# Patient Record
Sex: Male | Born: 1965
Health system: Southern US, Community
[De-identification: ages and names within clinical notes are randomized; demographics above are authoritative.]

## PROBLEM LIST (undated history)

## (undated) DIAGNOSIS — E119 Type 2 diabetes mellitus without complications: Secondary | ICD-10-CM

## (undated) DIAGNOSIS — I219 Acute myocardial infarction, unspecified: Secondary | ICD-10-CM

## (undated) DIAGNOSIS — I2510 Atherosclerotic heart disease of native coronary artery without angina pectoris: Secondary | ICD-10-CM

## (undated) DIAGNOSIS — I10 Essential (primary) hypertension: Secondary | ICD-10-CM

## (undated) DIAGNOSIS — K5090 Crohn's disease, unspecified, without complications: Secondary | ICD-10-CM

## (undated) DIAGNOSIS — K5190 Ulcerative colitis, unspecified, without complications: Secondary | ICD-10-CM

## (undated) DIAGNOSIS — I77819 Aortic ectasia, unspecified site: Secondary | ICD-10-CM

## (undated) HISTORY — PX: COLON SURGERY: SHX602

## (undated) HISTORY — DX: Type 2 diabetes mellitus without complications: E11.9

## (undated) HISTORY — DX: Ulcerative colitis, unspecified, without complications: K51.90

## (undated) HISTORY — DX: Essential (primary) hypertension: I10

## (undated) HISTORY — PX: COLONOSCOPY: SHX174

## (undated) HISTORY — PX: EYE SURGERY: SHX253

---

## 1998-07-22 HISTORY — PX: TESTICLE REMOVAL: SHX68

## 2001-05-19 ENCOUNTER — Encounter (INDEPENDENT_AMBULATORY_CARE_PROVIDER_SITE_OTHER): Payer: Self-pay | Admitting: Specialist

## 2001-05-19 ENCOUNTER — Inpatient Hospital Stay (HOSPITAL_COMMUNITY): Admission: EM | Admit: 2001-05-19 | Discharge: 2001-05-29 | Payer: Self-pay | Admitting: Urology

## 2001-05-20 ENCOUNTER — Encounter: Payer: Self-pay | Admitting: Urology

## 2001-05-24 ENCOUNTER — Encounter: Payer: Self-pay | Admitting: Internal Medicine

## 2001-05-26 ENCOUNTER — Encounter: Payer: Self-pay | Admitting: Urology

## 2001-05-27 ENCOUNTER — Encounter: Payer: Self-pay | Admitting: Urology

## 2005-04-08 ENCOUNTER — Inpatient Hospital Stay (HOSPITAL_COMMUNITY): Admission: EM | Admit: 2005-04-08 | Discharge: 2005-04-09 | Payer: Self-pay | Admitting: *Deleted

## 2005-04-08 IMAGING — CT CT ORBIT/TEMPORAL/IAC W/O CM
3 series · 17 of 30 positions shown, 19 images · IV contrast (agent unspecified)
Comparison: none

CLINICAL DATA: Nail in eye, preop.
 HEAD CT WITHOUT CONTRAST:
TECHNIQUE: Contiguous axial images were obtained from the base of the skull through the vertex, according to standard protocol, without contrast.
 Axial scans from the base to the vertex were performed in the unenhanced state.  The ventricular system is normal in size and configuration and the septum is in a normal midline position.  On image #14, there is a small focus of increased attenuation posteriorly in the right posterior parietal - occipital region.  This is higher attenuation than adjacent brain and may represent a small focus of hemorrhagic contusion.  Has the patient undergone recent trauma?
TECHNIQUE: Axial and coronal plane CT imaging was performed through the orbits.  No intravenous contrast was administered.
 On the soft tissue windows, no metallic foreign body is seen.  Both globes are symmetrical, and no retrobulbar abnormality is seen.  The optic nerves and extraocular musculature appears normal.  No bony abnormality is seen.  The zygomatic arches are intact and the condylar heads appear to be normal in position.

[Series 2: brain · axial · 0.47mm/px · z∈[+167,+288]mm · 7 of 32 slices shown, 9 images]
[im 4/32  brain]
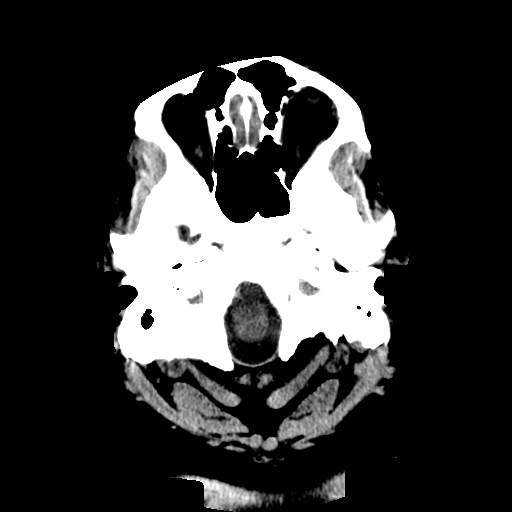
[im 4/32  bone]
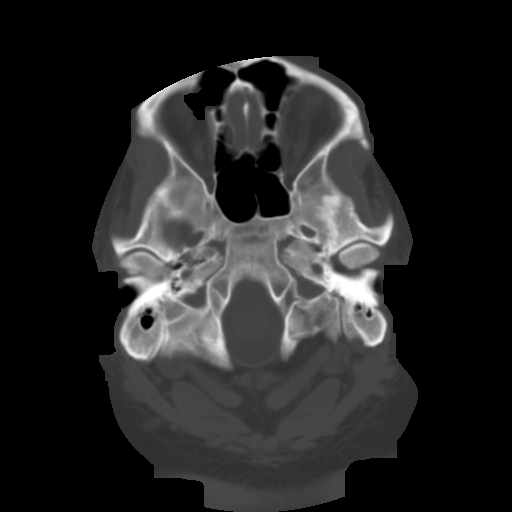
[im 8/32  bone]
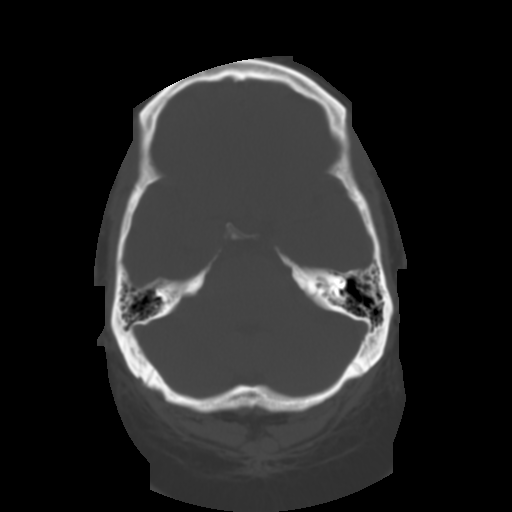
[im 12/32  bone]
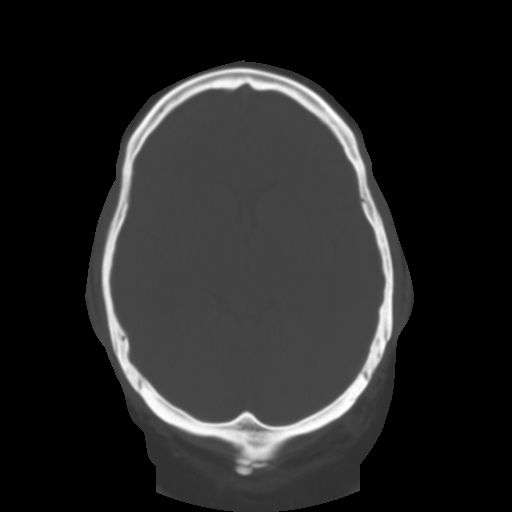
[im 16/32  bone]
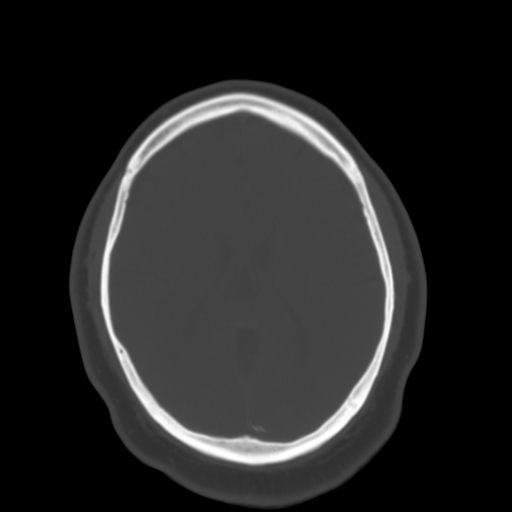
[im 20/32  brain]
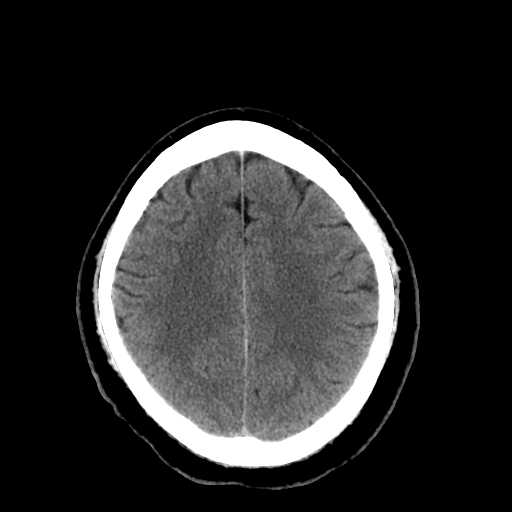
[im 20/32  bone]
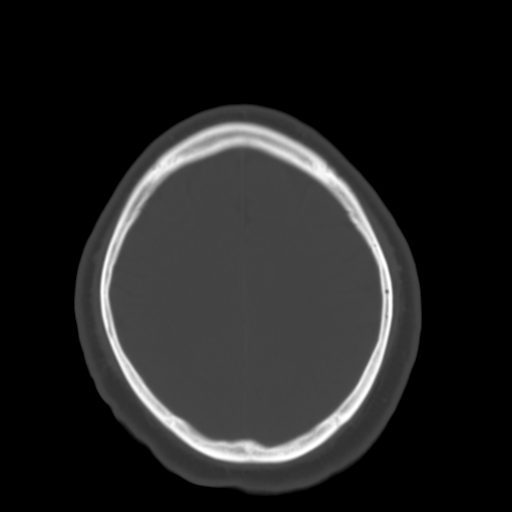
[im 24/32  bone]
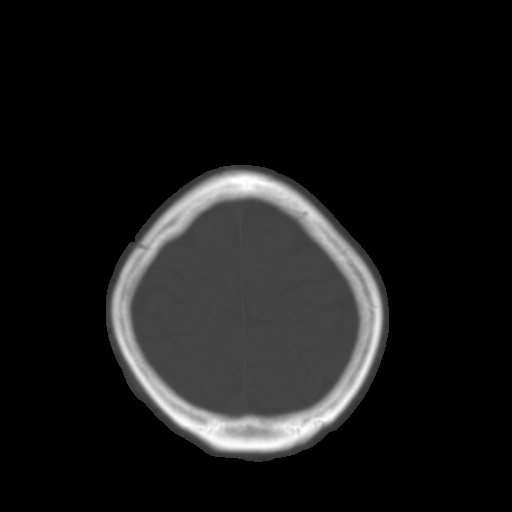
[im 28/32  bone]
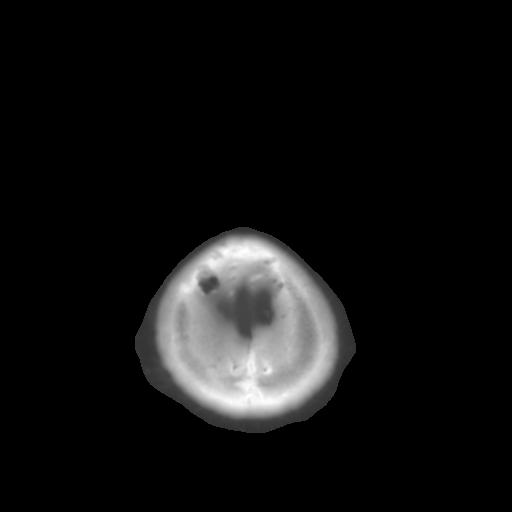

[Series 3: recon 2: brain · axial · 0.47mm/px · z∈[+167,+288]mm · 8 of 32 slices shown]
[im 4/32  bone]
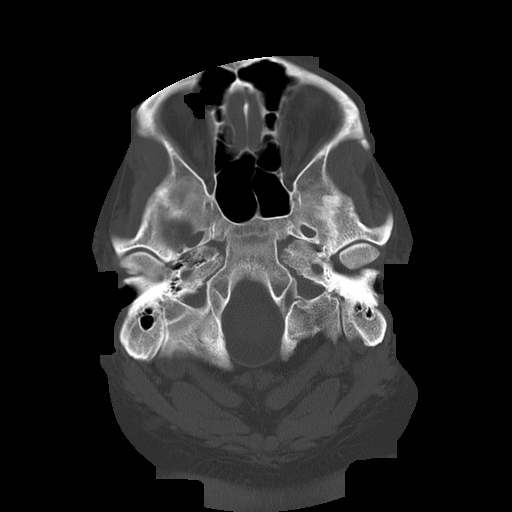
[im 7/32  bone]
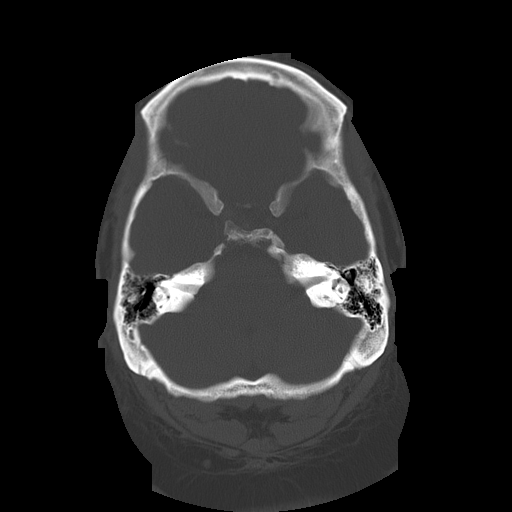
[im 11/32  bone]
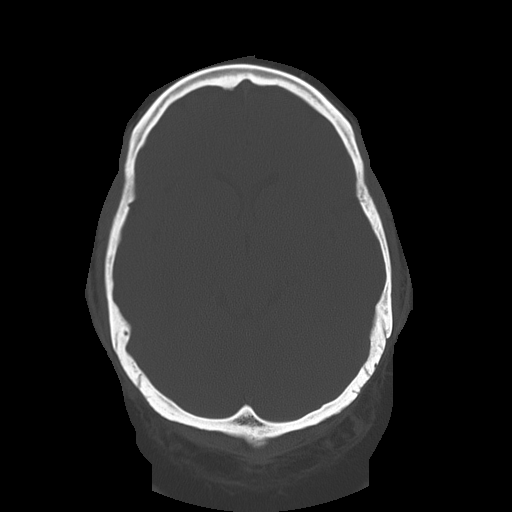
[im 14/32  bone]
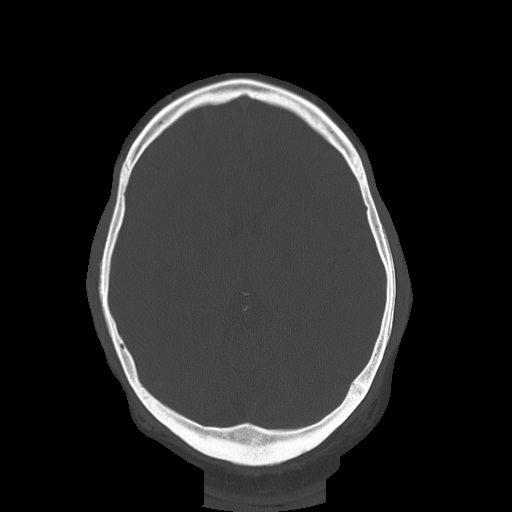
[im 18/32  bone]
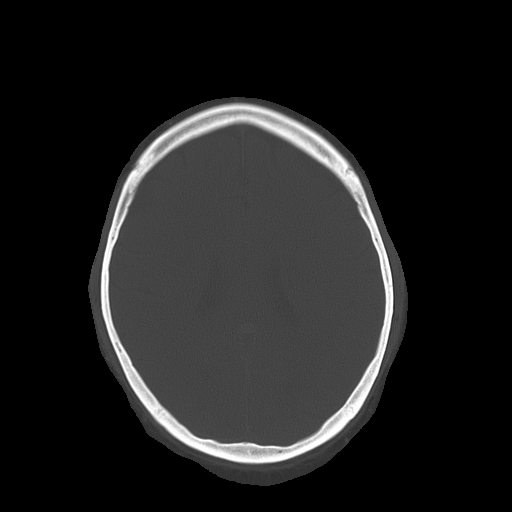
[im 21/32  bone]
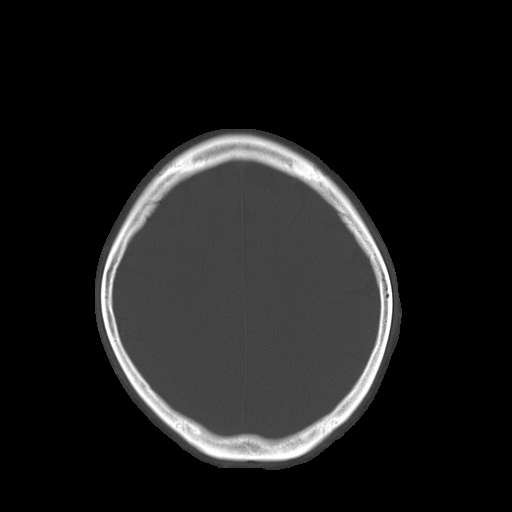
[im 25/32  bone]
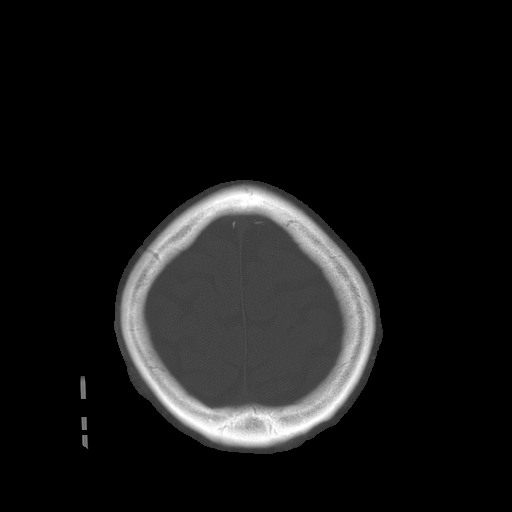
[im 28/32  bone]
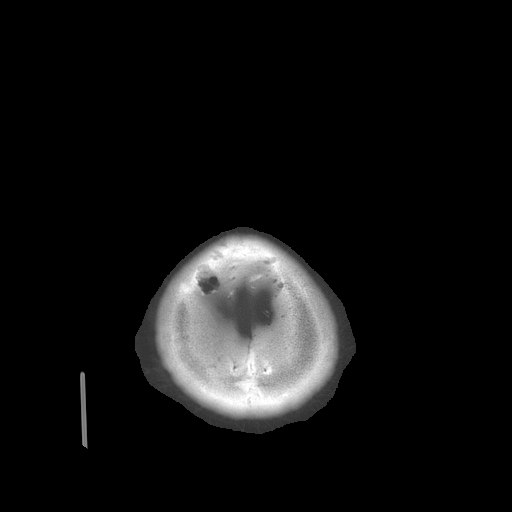

[Series 5: recon 2: supine facial bones · axial · 0.19mm/px · z∈[+104,+112]mm · 2 of 35 slices shown]
[im 4/35  bone]
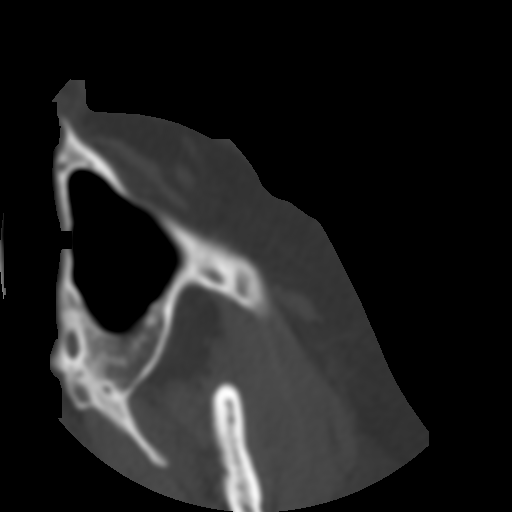
[im 7/35  bone]
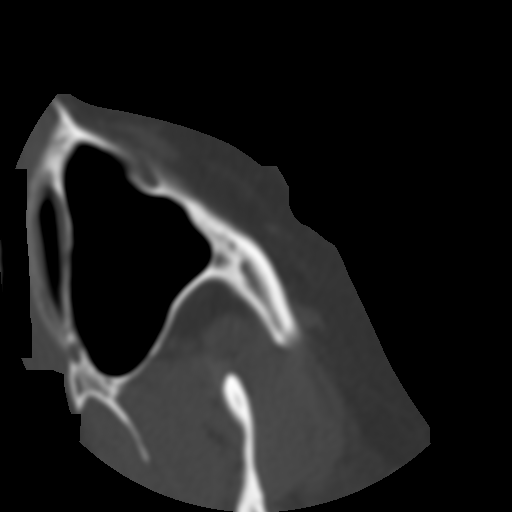

[17 of 30 positions shown; findings below may reference images not displayed]

IMPRESSION: Small focus of higher attenuation in the right posterior parietal-occipital region may represent a focus of hemorrhagic contusion.  Has there been recent trauma to the head?
 CT OF THE ORBITS WITHOUT CONTRAST:
IMPRESSION: Both globes appear symmetrical in size and configuration and no opaque foreign body is seen.  No fracture is noted.

## 2005-11-11 ENCOUNTER — Inpatient Hospital Stay (HOSPITAL_COMMUNITY): Admission: RE | Admit: 2005-11-11 | Discharge: 2005-11-13 | Payer: Self-pay | Admitting: Urology

## 2006-08-14 ENCOUNTER — Emergency Department (HOSPITAL_COMMUNITY): Admission: EM | Admit: 2006-08-14 | Discharge: 2006-08-14 | Payer: Self-pay | Admitting: Family Medicine

## 2007-02-28 ENCOUNTER — Emergency Department (HOSPITAL_COMMUNITY): Admission: EM | Admit: 2007-02-28 | Discharge: 2007-02-28 | Payer: Self-pay | Admitting: Family Medicine

## 2007-02-28 IMAGING — CR DG FOOT COMPLETE 3+V*R*
3 series · 3 of 3 positions shown · non-contrast
Comparison: none

CLINICAL DATA: Injury three days ago.
 RIGHT FOOT ? 3 VIEW:

[view not recorded (1 of 3)]
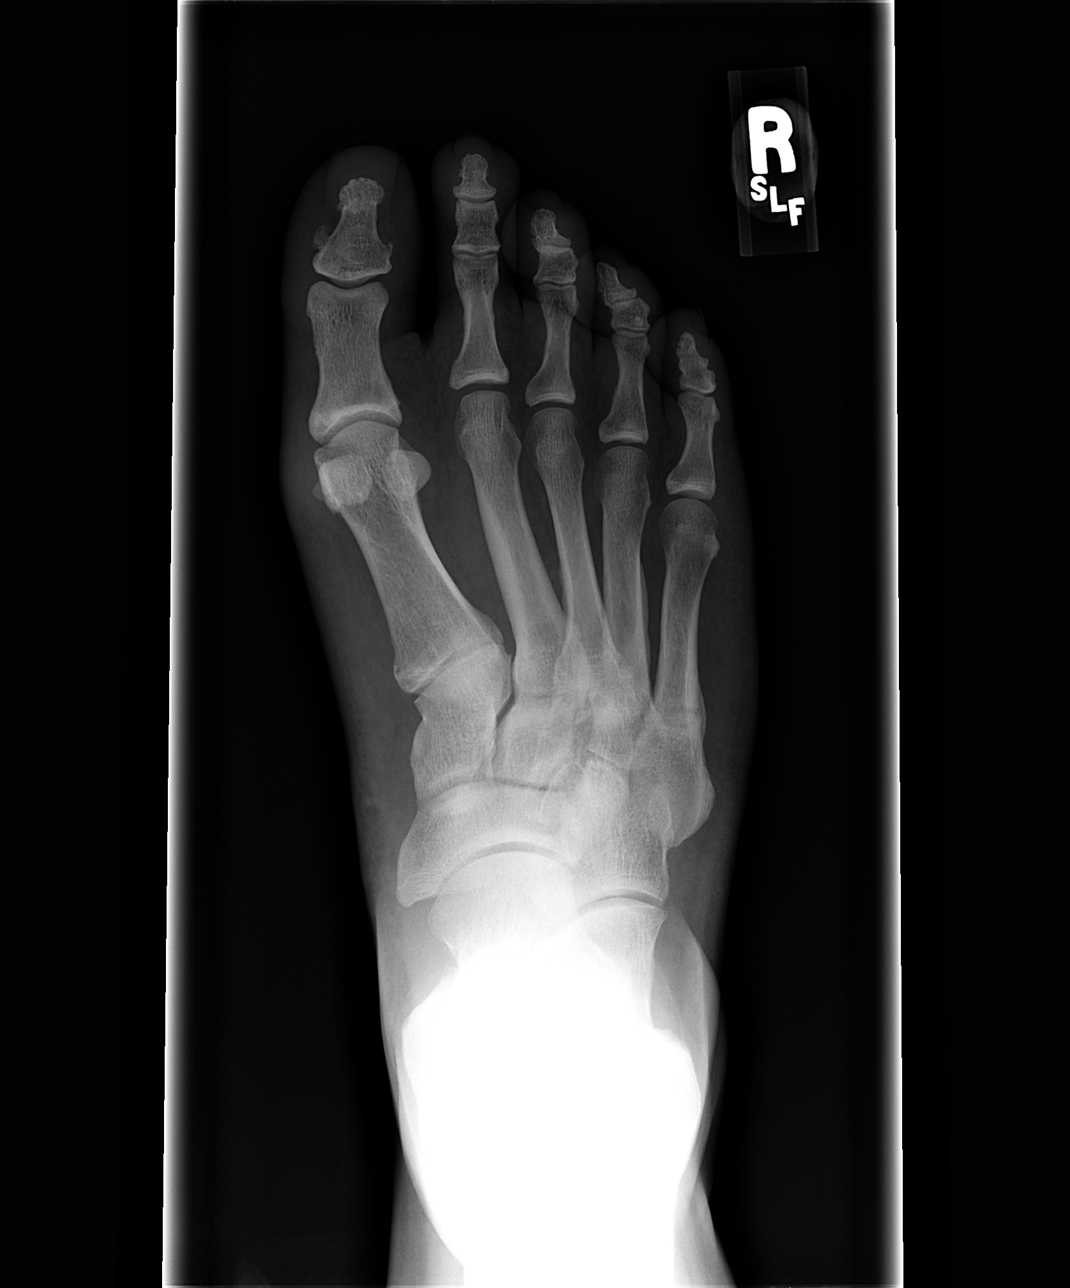

[view not recorded (2 of 3)]
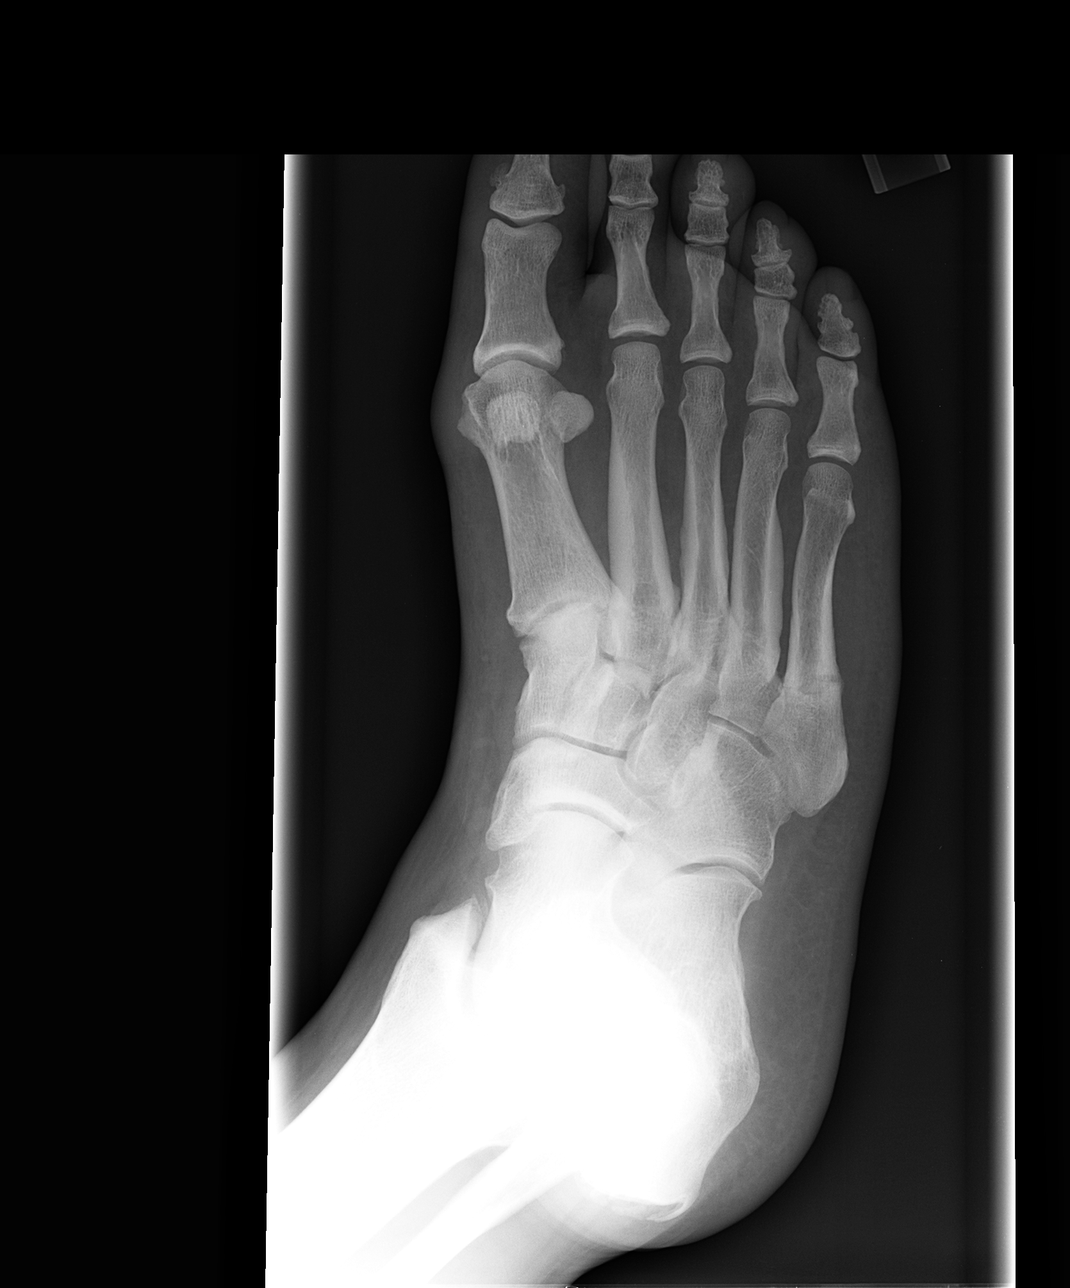

[view not recorded (3 of 3)]
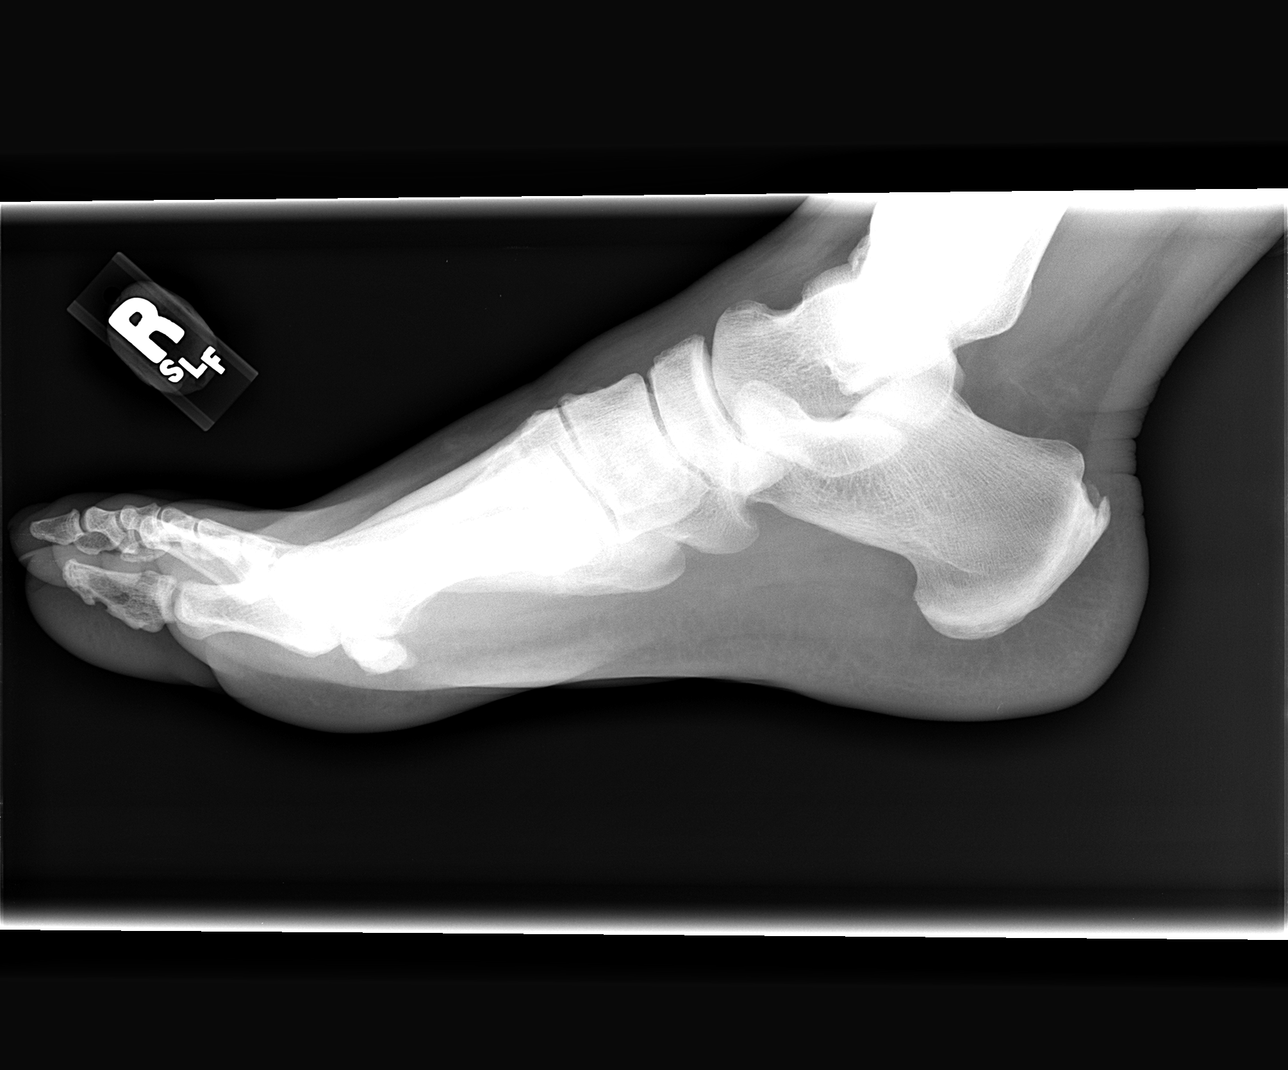

[3 of 3 positions shown; findings below may reference images not displayed]

FINDINGS: Spurring is noted at the posterior calcaneus at the insertion of the Achilles tendon.  Degenerative changes of the first metatarsal phalangeal joint are noted.  A healing fracture at the base of the fifth metatarsal is noted.  No acute fractures or dislocations are seen.  Periosteal reaction along the diaphyses of the third and fourth metatarsals is seen, likely reflecting healing stress fractures.
IMPRESSION: No acute bony injury.  Degenerative change.  Healing fracture of the fifth metatarsal.

## 2008-03-26 ENCOUNTER — Emergency Department (HOSPITAL_COMMUNITY): Admission: EM | Admit: 2008-03-26 | Discharge: 2008-03-26 | Payer: Self-pay | Admitting: Emergency Medicine

## 2010-06-28 ENCOUNTER — Emergency Department (HOSPITAL_COMMUNITY)
Admission: EM | Admit: 2010-06-28 | Discharge: 2010-06-28 | Payer: Self-pay | Source: Home / Self Care | Admitting: Emergency Medicine

## 2010-07-22 HISTORY — PX: OTHER SURGICAL HISTORY: SHX169

## 2010-12-07 ENCOUNTER — Encounter (HOSPITAL_COMMUNITY)
Admission: RE | Admit: 2010-12-07 | Discharge: 2010-12-07 | Disposition: A | Discharge status: Home / Self Care | Payer: 59 | Source: Ambulatory Visit | Attending: General Surgery | Admitting: General Surgery

## 2010-12-07 LAB — CBC
MCV: 83.7 fL (ref 78.0–100.0)
Platelets: 197 10*3/uL (ref 150–400)
RDW: 12.5 % (ref 11.5–15.5)
WBC: 6.6 10*3/uL (ref 4.0–10.5)

## 2010-12-07 LAB — SURGICAL PCR SCREEN: Staphylococcus aureus: NEGATIVE

## 2010-12-07 NOTE — Op Note (Signed)
[facility]  Patient:    Sean Schaefer, Sean Schaefer Visit Number: [id number] MRN: [id number]          Service Type: SUR Location: 3W 0346 01 Attending Physician:  Lamona Curl Dictated by:   Einar Crow, M.D. Proc. Date: 05/20/01 Admit Date:  05/19/2001   CC:         Darrelyn Hillock, M.D.   Operative Report  REFERRING PHYSICIANS: 1. Awanda Mink ______, M.D., [facility]. 2. Darrelyn Hillock, M.D., [facility].  ATTENDING:  Einar Crow, M.D.  PREOPERATIVE DIAGNOSIS:  Fourniers gangrene, with untreated diabetes.  POSTOPERATIVE DIAGNOSIS:  Fourniers gangrene, with untreated diabetes.  PROCEDURE:  Left scrotal exploration.  Drainage of multiple left epididymal abscesses, left orchiectomy.  ANESTHESIA:  General.  DRAINS:  Iodoform gauze.  INDICATIONS FOR PROCEDURE:  A 45 year old black male, probable undiagnosed diabetic.  Presents with left epididylmytis/epididymal abscess early this morning, 2 x 4 area of necrotic scrotal skin noted in the dependent portion of the left hemiscrotum.  Decision was made to proceed with exploration.  DESCRIPTION OF PROCEDURE:  The patient was prepped and draped in the dorsal lithotomy position, after institution of adequate level of general anesthesia. A linear incision was made in the anterior aspect of the left hemiscrotum, taking care to circumferentially circumscribe the area of necrotic scrotal skin in the dependent portion of the left hemiscrotum.  Scrotal edges were then retracted laterally.  A large area of necrotic debris occupied two-thirds of the left hemiscrotum.  Necrotic debris extended above the testis to a point just below the left external inguinal ring.  It is felt to be impossible to separate the testis and cord from the extensive necrotic debris.  Right angled clamp was then used to segmentally divide the left spermatic cord, at a point just distal  to the left external inguinal ring.  Proximal edge of the cor was oversewn with interrupted suture of 0 silk times 2.  Cord was then divided and retracted inferiorly testis epididymis and his attached necrotic debris was then separated from the scrotal wall, and sent as an intact specimen.  Small areas of necrotic debris was then separated from the scrota wall; it was sent as an intact specimen.  Small areas of Necrotic debris were then lifted from the scrotal wall and dissected office using fine scissors.  Bleeding sites were lightly cauterized using the Bovie.  Once all of the necrotic debris had been removed, care was taken to carefully pack the incision with Iodoform gauze cover it with wet-to-dry dressing and a mesh brief.  The patient was returned to recovery room in satisfactory condition. Dictated by:   Einar Crow, M.D. Attending Physician:  Lamona Curl DD:  05/20/01 TD:  05/21/01 Job: [id number]

## 2010-12-07 NOTE — Discharge Summary (Signed)
[facility]  Patient:    Sean Schaefer, BEEHLER Visit Number: [id number] MRN: [id number]          Service Type: SUR Location: 3W 0346 01 Attending Physician:  Lamona Curl Dictated by:   Einar Crow, M.D. Admit Date:  05/19/2001 Discharge Date: 05/29/2001   CC:         Darrelyn Hillock, M.D.  Dr. Suzi Roots   Discharge Summary  DATE OF BIRTH:  06-02-1966  Indications, medications, allergies, tobacco, ETOH, past medical history, social history, physical exam and review of systems were all outlined in the admission note of May 19, 2001.  HOSPITAL COURSE:  This is a 38 pound, 45 year old, African-American male admitted with febrile left epididymitis on May 19, 2001.  The patient had not had a family doctor in over 12 years.  On admission, white count was 26.8 with temperature of 103.  By May 20, 2001, the patient had developed a small area of necrotic skin along the inferior aspect of the left hemiscrotum. Glucose on admission, prior to initiation of IV fluids, was 169.  Clinical suspicion was for undiagnosed, untreated diabetes with Fournier gangrene.  The patient was taken to the operating room on May 20, 2001, for left scrotal exploration, drainage of left epididymal abscess.  It was impossible to separate the abscess cavity from the left testis requiring a left orchiectomy. Anesthesia was general.  The wound was packed with Iodoform gauze.  Initial antibiotic therapy on admission had included Flagyl, gentamicin and Ancef. Postoperatively, consultation was sought with infectious disease, Dr. Michel Bickers and [facility], Dr. Dorian Pod, for management of patients presumed diabetes, gout and appropriate selection of antibiotic therapy.  By postop day #1, May 21, 2001, white count had fallen to 17.4. Flagyl, Ancef and gentamicin were discontinued and switched to intravenous Unasyn.   Diabetic management initially with sliding scale insulin brought sugars under better control.  Wound culture returned gram-positive rods suspicious for Streptococcus and gram-negative rods suspicious for E. coli. No anaerobes were identified, although clinical suspicion remained that there had been at least an anaerobic component to the infection.  By postop day #2, the patient had pain in the left great toe thought to be due to undiagnosed gout treated with intravenous colchicine by Dr. Jimmye Norman with prompt resolution of pain.  The patient had slow recovery from his left scrotal surgery.  The wound was packed on a b.i.d. basis.  Perineal cellulitis slowly improved.  By postop day #5, May 25, 2001, white count had slowly fallen to 16.1 with creatinine 1.0.  The patient had episode of emesis.  Further discussion with patient and family members revealed distant history of ulcerative colitis with colectomy at the [facility].  CT scan of the abdomen and pelvis was performed and showed no obstruction of the small bowel, no evidence of abscess within the left groin or pelvis.  The patients nausea and emesis disappeared with discontinuation of PCA morphine.  By postop day #7, May 27, 2001, the patient was back to regular diet with regular bowel habits.  White count had fallen to 14.6.  The patient had been afebrile for two days.  He was switched to p.o. antibiotic therapy on May 28, 2001. White count continued to fall to 13.3.  By May 29, 2001, the patients family felt comfortable doing the wound dressings themselves.  The patient was doing well on oral Augmentin as well as oral Glucophage and Glucotrol. Colchicine had been discontinued.  The plan was made to discharge the patient home on May 29, 2001, with followup for diabetic management with [place], Glucophage, Glucotrol and Allopurinol.  The patient will see Dr. Reece Agar back in the office after two weeks of  p.o. Augmentin for wound check.  DISCHARGE MEDICATIONS: 1. Protonix. 2. Colchicine. 3. Glucotrol. 4. Augmentin. 5. Vicodin.  CONDITION ON DISCHARGE:  Fair.  DISCHARGE DIAGNOSES: 1. Undiagnosed diabetic. 2. Undiagnosed gout. 3. Obesity with history of colectomy for ulcerative colitis. 4. Fournier gangrene of the left hemiscrotum.  FOLLOWUP:  Follow up with Dr. Reece Agar in two weeks. Dictated by:   Einar Crow, M.D. Attending Physician:  Lamona Curl DD:  06/02/01 TD:  06/02/01 Job: [id number]

## 2010-12-07 NOTE — Op Note (Signed)
NAMEAZRIEL, JAKOB                ACCOUNT NO.:  [id number]   MEDICAL RECORD NO.:  [id number]          PATIENT TYPE:  INP   LOCATION:  87                         FACILITY:  [facility]   PHYSICIAN:  Sigmund I. Gaynelle Arabian, M.D.DATE OF BIRTH:  [birth year]   DATE OF PROCEDURE:  11/11/2005  DATE OF DISCHARGE:                                 OPERATIVE REPORT   PREOPERATIVE DIAGNOSIS:  Left periurethral abscess.   POSTOPERATIVE DIAGNOSIS:  Left periurethral abscess.   OPERATION:  Incision and drainage of left periurethral abscess.   SURGEON:  Sigmund I. Gaynelle Arabian, M.D.   ANESTHESIA:  General LMA.   PREPARATION:  After appropriate preanesthesia, the patient is brought to the  operating room and placed on the operating room table in the dorsal supine  position where general LMA anesthesia was induced. The patient was then  replaced in dorsal lithotomy position. The pubis was prepped with Betadine  solution, draped in usual fashion.   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 Since then, the patient has had recurrent intermittent  left scrotal abscesses and has been cared for by Dr. Diona Fanti.  In Dr.  Alan Ripper absence, I have been asked to evaluate and treat the patient for  left scrotal abscess, which has started to drain while awaiting surgical  therapy.   PROCEDURE:  With the patient in the dorsal lithotomy position, evaluation  shows the patient has a right testicle and an empty left hemiscrotum, which  is erythematous, and hot to palpation. There is obvious area of pus drainage  from the periurethral area in the perineum. Flexible cystoscopy is  accomplished and shows a normal appearing urethra with normal bilobar  prostate, normal bladder neck. The bladder base was normal, there was no  evidence of bladder stone, tumor, for bladder diverticular formation.   The  bladder is drained with a 16-French Foley catheter which is left to straight  drainage.   The previously partially draining abscess cavity is dissected and a large  amount of pus was identified in a very large cavity, which tracks of both  inferiorly toward the rectum and tracks laterally toward the inner thigh.  The abscess tracked distal toward the left hemiscrotum, but this was blind  ending.  Following all pus drainage, cultures were obtained, and the wound  was irrigated with 3 meters of pulse lavage. The patient tolerated well and  received IV Cipro during the procedure. He was awakened and taken to the  recovery room in good condition.      Sigmund I. Gaynelle Arabian, M.D.  Electronically Signed    SIT/MEDQ  D:  11/11/2005  T:  11/12/2005  Job:  [id number]

## 2010-12-07 NOTE — Op Note (Signed)
NAME:  Sean Schaefer, Sean Schaefer                ACCOUNT NO.:  [id number]   MEDICAL RECORD NO.:  [id number]          PATIENT TYPE:  INP   LOCATION:  1826                         FACILITY:  [facility]   PHYSICIAN:  Timothy R. Talbert Forest, MD   DATE OF BIRTH:  10-20-1965   DATE OF PROCEDURE:  04/08/2005  DATE OF DISCHARGE:                                 OPERATIVE REPORT   PREOPERATIVE DIAGNOSIS:  Open globe (corneal laceration), left eye.   POSTOPERATIVE DIAGNOSIS:  Open globe (corneal laceration), left eye.   OPERATION PERFORMED:  Repair of corneal laceration, left eye.   SURGEON:  Carlos Levering. Talbert Forest, MD   ANESTHESIA:  General.   ESTIMATED BLOOD LOSS:  Minimal.   COMPLICATIONS:  None.   INDICATIONS FOR PROCEDURE:  The patient is a 45 year old gentleman that was  hit in the eye with a nail which caused a corneal laceration which gave him  a decrease in vision and the wound was actively leaking.  The risks and  benefits of the surgery were explained to the patient.  The patient elected  for surgery.   DESCRIPTION OF PROCEDURE:  The patient was identified in the preoperative  holding area where consent was noted to be on the chart and the correct  operative eye being the left eye was identified by the patient and marked by  the surgeon.  The patient was taken back to the operating suite where leads  and monitors were placed and the patient was placed under general  anesthesia.  The eye was then prepped and draped in the usual sterile  fashion for micro-ophthalmic surgery with care given to avoid putting  pressure on the involved eye.  A lid speculum was then placed and the  surgery was initiated. A stab incision was made at the 6 o'clock position of  the left eye and Healon was used to fill the anterior chamber.  The 27 gauge  cannula was used to free up the iris adhesions to the cornea.  Once the eye  was formed, five 10-0 nylon sutures were placed across the wound.  A  separate incision was made at the  3 o'clock position of the left eye in  order to remove the Healon.  This was removed without difficulty.  The eye  was found to be leak free and firm and Tobradex ointment was placed in the  eye and the patient was taken to the recovery room in stable condition.  The  patient did receive 400 mg of IV Avalox during the surgery.  The patient was  started on four to five vancomycin and four to five tobramycin every hour on  the hour in the left eye.           ______________________________  Carlos Levering. Talbert Forest, MD     TRB/MEDQ  D:  04/08/2005  T:  04/09/2005  Job:  [id number]

## 2010-12-08 ENCOUNTER — Ambulatory Visit (HOSPITAL_COMMUNITY)
Admission: RE | Admit: 2010-12-08 | Discharge: 2010-12-08 | Disposition: A | Discharge status: Home / Self Care | Payer: 59 | Source: Ambulatory Visit | Attending: General Surgery | Admitting: General Surgery

## 2010-12-08 DIAGNOSIS — K612 Anorectal abscess: Secondary | ICD-10-CM | POA: Insufficient documentation

## 2010-12-08 DIAGNOSIS — Z01812 Encounter for preprocedural laboratory examination: Secondary | ICD-10-CM | POA: Insufficient documentation

## 2010-12-10 NOTE — Op Note (Signed)
  NAME:  Sean Schaefer, Sean Schaefer                ACCOUNT NO.:  [id number]  MEDICAL RECORD NO.:  [id number]           PATIENT TYPE:  O  LOCATION:  SDS                          FACILITY:  [facility]  PHYSICIAN:  Sammuel Hines. Daiva Nakayama, M.D. DATE OF BIRTH:  [birth year]  DATE OF PROCEDURE:  12/08/2010 DATE OF DISCHARGE:  12/07/2010                              OPERATIVE REPORT   PREOPERATIVE DIAGNOSIS:  Multiple perirectal abscesses.  POSTOPERATIVE DIAGNOSIS:  Multiple perirectal abscesses.  PROCEDURE:  Excision incision and drainage of multiple perirectal abscess.  SURGEON:  Sammuel Hines. Daiva Nakayama, MD  ANESTHESIA:  General via LMA.  PROCEDURE:  After informed consent was obtained, the patient was brought to the operating room, placed in supine position on the operating room table.  After induction of general anesthesia, the patient was moved into lithotomy position.  His perirectal area and perineal area was all prepped with Betadine and draped in usual sterile manner.  In the 3 o'clock position, the patient had one shallow area that did not track anywhere, this was just opened sharply with a 10 blade knife and the few granulation tissue fulgurated with the cautery until it was hemostatic. The area in about the 2 o'clock position was probed with a silver probe and the probe ended up following a tract into the posterior perirectal space.  The tract was very thin and narrow, it was not a big cavity that we could appreciate.  The tract did sort of head towards the rectum. The tract was opened a distance of about maybe 4 cm or so.  We did not want to disrupt a lot of the musculature around the rectum so we ended up by curetting the tract and then fulgurating it with the cautery and then packing it with Iodoform gauze.  Similarly, there was another tract that started at the base of the scrotum just about the 12:30 position. This area was able to be probed with silver probe and it seemed to also follow a very long  deep tract towards the rectum but we could not appreciate it definitely communicating with the rectum.  A finger could not be inserted into the rectum but it was a very tight rectum.  It was so tight we could not get a bullet retractor in the rectum.  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 This tract was also curetted and then fulgurated with the cautery and then packed with Iodoform gauze.  Sterile dressings were then applied. The patient tolerated the procedure well.  At the end of the case, all needle, sponge and instrument counts were correct.  The patient was then awakened and taken to recovery in stable condition.     Sammuel Hines. Daiva Nakayama, M.D.    PST/MEDQ  D:  12/08/2010  T:  12/09/2010  Job:  [id number]  Electronically Signed by Autumn Messing III M.D. on 12/10/2010 01:57:15 PM

## 2010-12-11 LAB — CULTURE, ROUTINE-ABSCESS

## 2010-12-13 LAB — ANAEROBIC CULTURE

## 2011-05-06 LAB — POCT RAPID STREP A: Streptococcus, Group A Screen (Direct): NEGATIVE

## 2012-12-16 ENCOUNTER — Telehealth (INDEPENDENT_AMBULATORY_CARE_PROVIDER_SITE_OTHER): Payer: Self-pay | Admitting: *Deleted

## 2012-12-16 NOTE — Telephone Encounter (Signed)
I saw him in 2012. I think he needs to see our colorectal specialist, Dr. Marcello Moores

## 2012-12-16 NOTE — Telephone Encounter (Signed)
Patient agreeable to see Dr. Marcello Moores and new patient appt made at this time.

## 2012-12-16 NOTE — Telephone Encounter (Signed)
Wife called to state patient saw Dr. Marlou Starks while in the hospital due to multiple perirectal abscess'.  Wife states that they have followed up with a proctologist per Dr. Marlou Starks however patient is still having same problems.  Wife asking for Dr. Gwendolyn Fill opinion on what they should do next.

## 2013-01-05 ENCOUNTER — Encounter (INDEPENDENT_AMBULATORY_CARE_PROVIDER_SITE_OTHER): Payer: Self-pay | Admitting: General Surgery

## 2013-01-05 ENCOUNTER — Ambulatory Visit (INDEPENDENT_AMBULATORY_CARE_PROVIDER_SITE_OTHER): Payer: Commercial Managed Care - PPO | Admitting: General Surgery

## 2013-01-05 VITALS — BP 124/92 | HR 101 | Temp 97.5°F | Resp 16 | Ht 71.0 in | Wt 230.4 lb

## 2013-01-05 DIAGNOSIS — K603 Anal fistula: Secondary | ICD-10-CM

## 2013-01-05 MED ORDER — METRONIDAZOLE 500 MG PO TABS: 500.0000 mg | ORAL_TABLET | Freq: Three times a day (TID) | ORAL | Status: DC

## 2013-01-05 MED ORDER — LIDOCAINE 5 % EX OINT: TOPICAL_OINTMENT | Freq: Three times a day (TID) | CUTANEOUS | Status: DC | PRN

## 2013-01-05 NOTE — Progress Notes (Signed)
Chief Complaint  Patient presents with  . New Evaluation    eval multiple peri-rectal absess    HISTORY: Sean Schaefer is a 47 y.o. male who presents to the office with rectal pain.  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 Other symptoms include multiple abscesses over the last few years.  He has tried sitz in the past with some success.  Nothing makes the symptoms worse.   It is intermittent in nature.  His bowel habits are regular and his bowel movements are soft to liquid.  He has 3-4 BM's a day.  He hasn't had a pouchoscopy in at least several years.     History reviewed. No pertinent past medical history.    Past Surgical History  Procedure Laterality Date  .  fistula (perianal)  2012  Seton placement in 2012 with Dr Toy Cookey Total colectomy with J pouch at [facility] at 47yo  Current Outpatient Prescriptions  Medication Sig Dispense Refill  . ibuprofen (ADVIL,MOTRIN) 200 MG tablet Take 200 mg by mouth every 6 (six) hours as needed for pain.      Marland Kitchen lidocaine (XYLOCAINE) 5 % ointment Apply topically 3 (three) times daily as needed.  35.44 g  2  . metroNIDAZOLE (FLAGYL) 500 MG tablet Take 1 tablet (500 mg total) by mouth 3 (three) times daily.  42 tablet  2   No current facility-administered medications for this visit.      Not on File    Family History  Problem Relation Age of Onset  . Hypertension Mother   . Cancer Mother   . Hypertension Father     History   Social History  . Marital Status: Married    Spouse Name: N/A    Number of Children: N/A  . Years of Education: N/A   Social History Main Topics  . Smoking status: Never Smoker   . Smokeless tobacco: Never Used  . Alcohol Use: No  . Drug Use: No  . Sexually Active: Yes   Other Topics Concern  . None   Social History Narrative  . None      REVIEW OF SYSTEMS - PERTINENT POSITIVES ONLY: Review of Systems - General ROS: negative for - chills, fever or  weight loss Hematological and Lymphatic ROS: negative for - bleeding problems, blood clots or bruising Respiratory ROS: no cough, shortness of breath, or wheezing Cardiovascular ROS: no chest pain or dyspnea on exertion Gastrointestinal ROS: no abdominal pain, change in bowel habits, or black or bloody stools Genito-Urinary ROS: no dysuria, trouble voiding, or hematuria  EXAM: Filed Vitals:   01/05/13 0855  BP: 124/92  Pulse: 101  Temp: 97.5 F (36.4 C)  Resp: 16    General appearance: alert and cooperative Resp: clear to auscultation bilaterally Cardio: regular rate and rhythm GI: soft, non-tender; bowel sounds normal; no masses,  no organomegaly   Anal Findings: multiple draining fistutla noted anteriorly and on left gluteal cleft, anterior seton in place    ASSESSMENT AND PLAN: Sean Schaefer is a 47 y.o. M with over 2 years of perirectal pain and drainage.  He has had multiple fistula over the past few years.  In the setting of IBD diagnosed at an early age, I am always concerned for crohn's disease.  This could also be hydradenitis or a horseshoe abscess as well.  I think the next best course of action would be an EUA and pouchoscopy  with biopsies.  I have explained my concerns to him and stated that given his J pouch, an immediate solution my not be obtainable.  He understands that a standard fistulotomy across sphincter is really not an option for him.  I will try to obtain records from his work up at [facility] office as well.      Rosario Adie, MD Colon and Rectal Surgery / [facility]      Visit Diagnoses: 1. Perianal fistula     Primary Care Physician: Provider Not In System

## 2013-01-05 NOTE — Patient Instructions (Signed)
Continue sitz baths.  Use lidocaine ointment for pain relief.  Take Flagyl as directed.

## 2013-08-28 ENCOUNTER — Encounter: Payer: 59 | Attending: Family Medicine

## 2013-08-28 VITALS — Ht 71.0 in | Wt 221.6 lb

## 2013-08-28 DIAGNOSIS — E119 Type 2 diabetes mellitus without complications: Secondary | ICD-10-CM | POA: Insufficient documentation

## 2013-08-28 DIAGNOSIS — Z713 Dietary counseling and surveillance: Secondary | ICD-10-CM | POA: Insufficient documentation

## 2013-08-28 NOTE — Progress Notes (Signed)
Patient was seen on 08/28/13 for the complete diabetes self-management series at the [facility].  Current A1c = 7.1% on 07/30/13  Handouts given during class include:  Living Well with Diabetes book  Carb Counting and Meal Planning book  Meal Plan Card  Carbohydrate guide  Meal planning worksheet  Low Sodium Flavoring Tips  The diabetes portion plate  Low Carbohydrate Snack Suggestions  A1c to eAG Conversion Chart  Diabetes Medications  Stress Management  Diabetes Recommended Care Schedule  Diabetes Success Plan  Core Class Satisfaction Survey  The following learning objectives were met by the patient during this course:  Describe diabetes  State some common risk factors for diabetes  Defines the role of glucose and insulin  Identifies type of diabetes and pathophysiology  Describe the relationship between diabetes and cardiovascular risk  State the members of the Healthcare Team  States the rationale for glucose monitoring  State when to test glucose  State their individual Target Range  State the importance of logging glucose readings  Describe how to interpret glucose readings  Identifies A1C target  Explain the correlation between A1c and eAG values  State symptoms and treatment of high blood glucose  State symptoms and treatment of low blood glucose  Explain proper technique for glucose testing  Identifies proper sharps disposal  Describe the role of different macronutrients on glucose  Explain how carbohydrates affect blood glucose  State what foods contain the most carbohydrates  Demonstrate carbohydrate counting  Demonstrate how to read Nutrition Facts food label  Describe effects of various fats on heart health  Describe the importance of good nutrition for health and healthy eating strategies  Describe techniques for managing your shopping, cooking and meal planning  List strategies to follow  meal plan when dining out  Describe the effects of alcohol on glucose and how to use it safely   State the amount of activity recommended for healthy living   Describe activities suitable for individual needs   Identify ways to regularly incorporate activity into daily life   Identify barriers to activity and ways to over come these barriers  Identify diabetes medications being personally used and their primary action for lowering glucose and possible side effects   Describe role of stress on blood glucose and develop strategies to address psychosocial issues   Identify diabetes complications and ways to prevent them  Explain how to manage diabetes during illness   Evaluate success in meeting personal goal   Establish 2-3 goals that they will plan to diligently work on until they return for the  56-monthfollow-up visit  Goals:  Follow Diabetes Meal Plan as instructed  Eat 3 meals and 2 snacks, every 3-5 hrs  Limit carbohydrate intake to 45 grams carbohydrate/meal Limit carbohydrate intake to 15 grams carbohydrate/snack Add lean protein foods to meals/snacks  Monitor glucose levels as instructed by your doctor  Aim for 15-30 mins of physical activity daily as tolerated  Bring food record and glucose log to your next nutrition visit  Your patient has established the following 4 month goals in their individualized success plan:  Count carbohydrates at most meals and snacks  Increase my activity at least 3 times a week  Take diabetes medications as scheduled  Your patient has identified these potential barriers to change:  Leg pain  Your patient has identified their diabetes self-care support plan as  Keeping records

## 2013-11-16 ENCOUNTER — Other Ambulatory Visit: Payer: Self-pay | Admitting: Gastroenterology

## 2013-11-16 DIAGNOSIS — K529 Noninfective gastroenteritis and colitis, unspecified: Secondary | ICD-10-CM

## 2013-11-19 ENCOUNTER — Ambulatory Visit (HOSPITAL_COMMUNITY)
Admission: RE | Admit: 2013-11-19 | Discharge: 2013-11-19 | Disposition: A | Discharge status: Home / Self Care | Payer: 59 | Source: Ambulatory Visit | Attending: Gastroenterology | Admitting: Gastroenterology

## 2013-11-19 ENCOUNTER — Other Ambulatory Visit: Payer: Self-pay | Admitting: Gastroenterology

## 2013-11-19 ENCOUNTER — Other Ambulatory Visit: Payer: 59

## 2013-11-19 DIAGNOSIS — K529 Noninfective gastroenteritis and colitis, unspecified: Secondary | ICD-10-CM

## 2013-11-19 DIAGNOSIS — Z8547 Personal history of malignant neoplasm of testis: Secondary | ICD-10-CM | POA: Insufficient documentation

## 2013-11-19 DIAGNOSIS — K603 Anal fistula, unspecified: Secondary | ICD-10-CM | POA: Insufficient documentation

## 2013-11-19 DIAGNOSIS — K5289 Other specified noninfective gastroenteritis and colitis: Secondary | ICD-10-CM | POA: Insufficient documentation

## 2013-11-19 IMAGING — CT CT ABD-PELV W/ CM
2 of 5 series · 16 of 46 positions shown, 18 images · IV contrast (omnipaque)
Comparison: None.

ADDENDUM:
These results were called by telephone at the time of interpretation
on [DATE] at [DATE] to Dr. ZEDAM , who verbally
acknowledged these results.
CLINICAL DATA: Inflammatory bowel disease. Perianal fistulas.
Personal history of testicular carcinoma.

EXAM:
CT ABDOMEN AND PELVIS WITH CONTRAST
TECHNIQUE: Multidetector CT imaging of the abdomen and pelvis was performed
using the standard protocol following bolus administration of
intravenous contrast.
CONTRAST:  80mL OMNIPAQUE IOHEXOL 300 MG/ML  SOLN

[Series 2: abd/ pelvis 5.0 i30f 1 · axial · 0.73mm/px · z∈[-495,-90]mm · 13 of 91 slices shown, 15 images]
[im 5/91  soft-tissue]
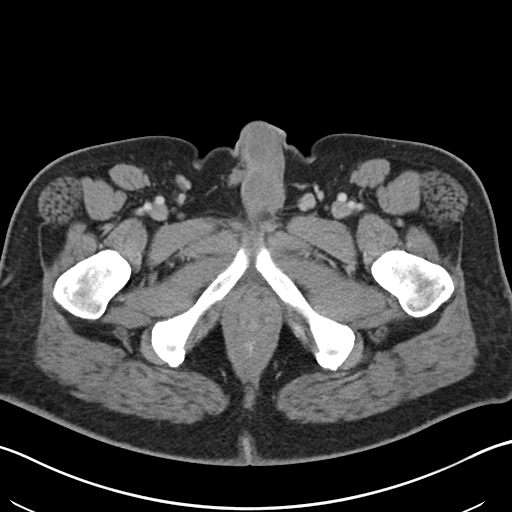
[im 5/91  bone]
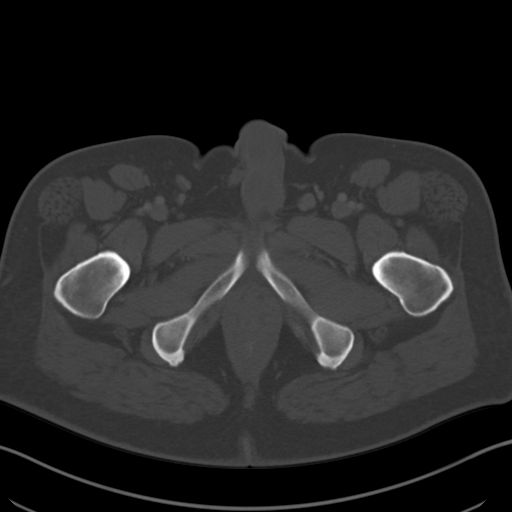
[im 14/91  soft-tissue]
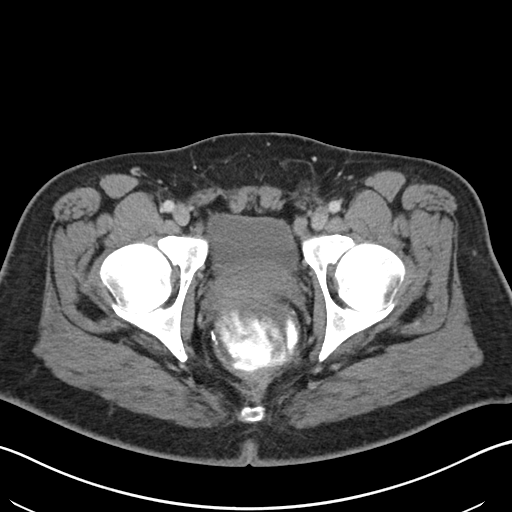
[im 19/91  soft-tissue]
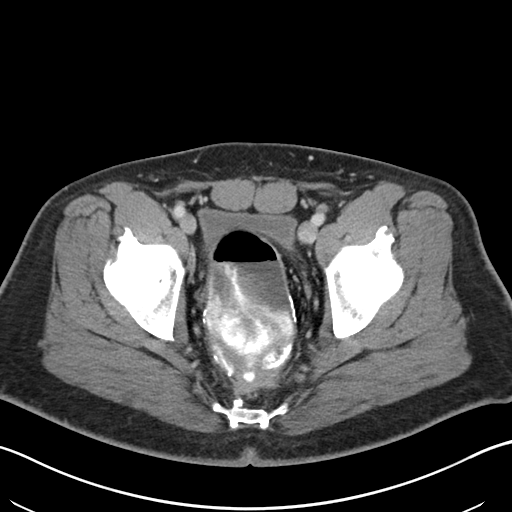
[im 28/91  soft-tissue]
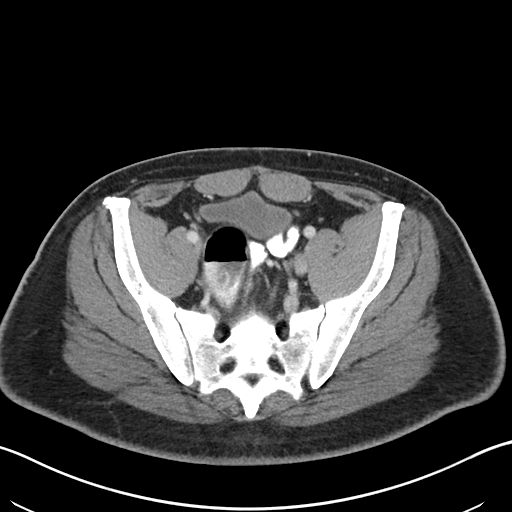
[im 32/91  soft-tissue]
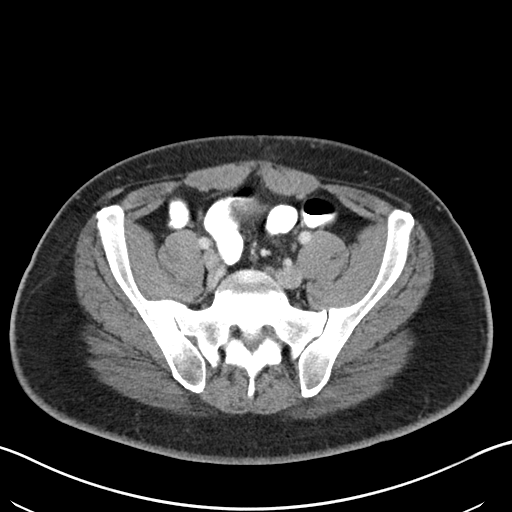
[im 41/91  soft-tissue]
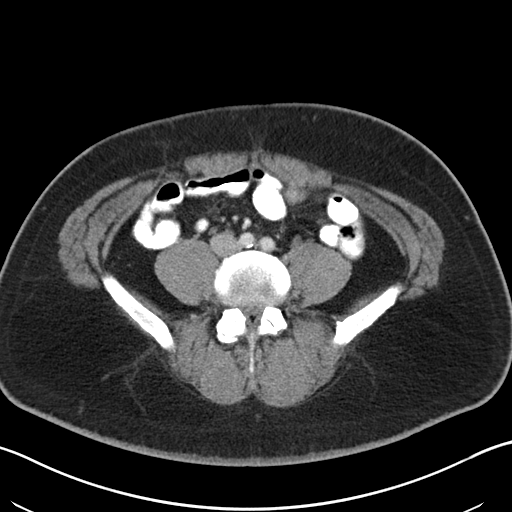
[im 46/91  soft-tissue]
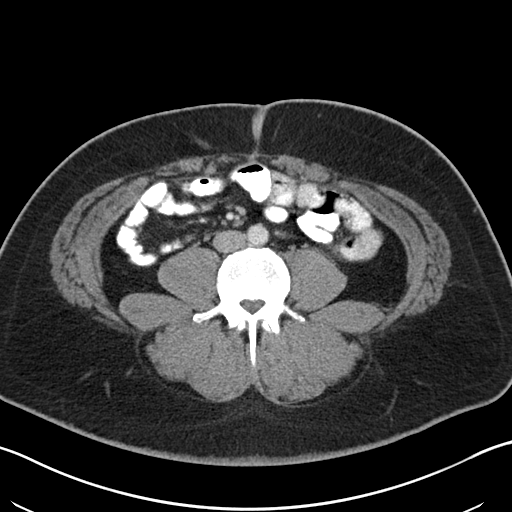
[im 50/91  soft-tissue]
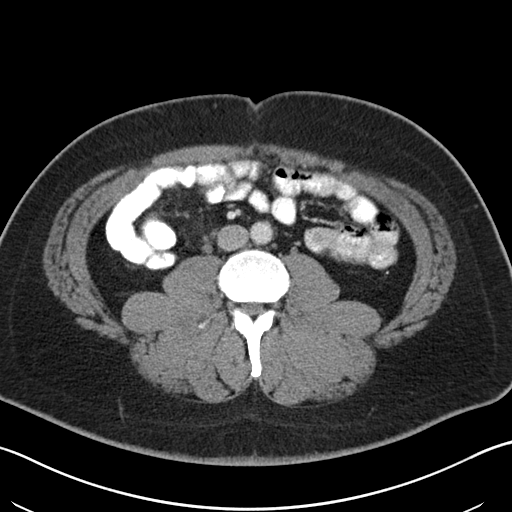
[im 59/91  soft-tissue]
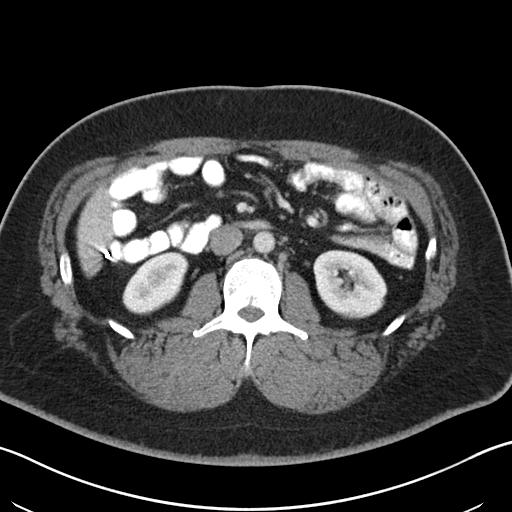
[im 59/91  bone]
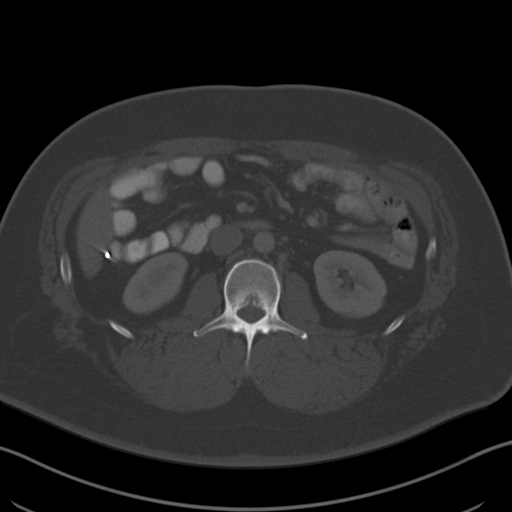
[im 64/91  soft-tissue]
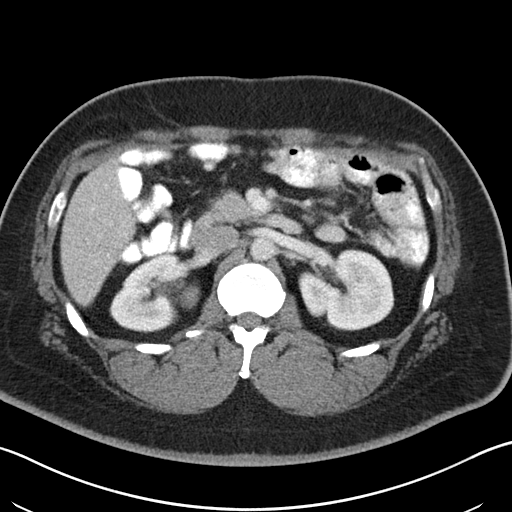
[im 73/91  soft-tissue]
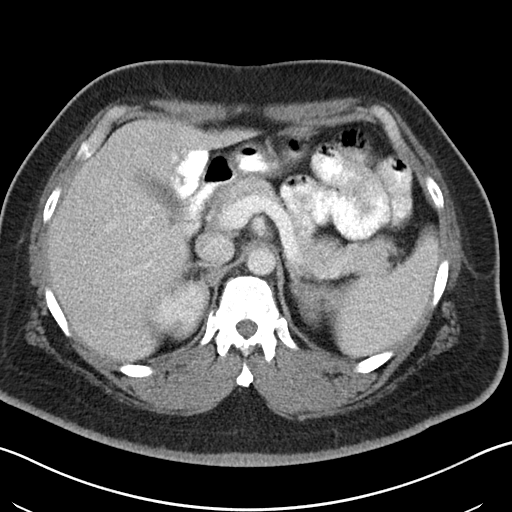
[im 77/91  soft-tissue]
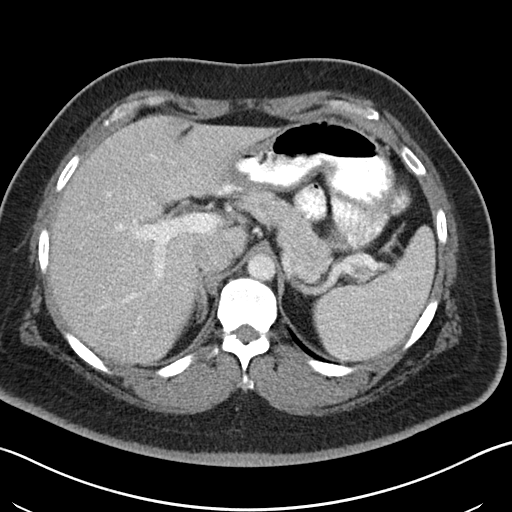
[im 86/91  soft-tissue]
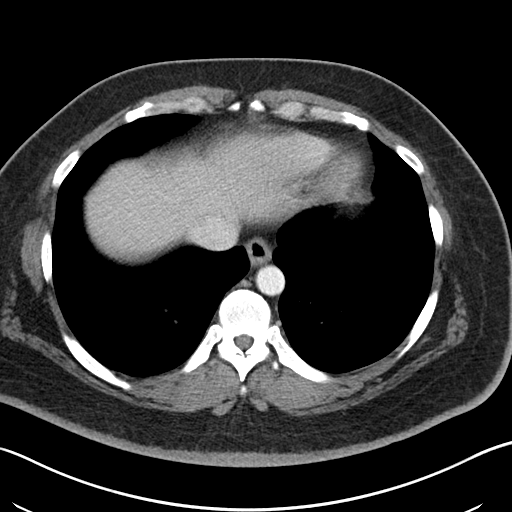

[Series 5: coronal soft tissue · coronal · 0.91mm/px · 3 of 74 slices shown]
[im 25/74  soft-tissue]
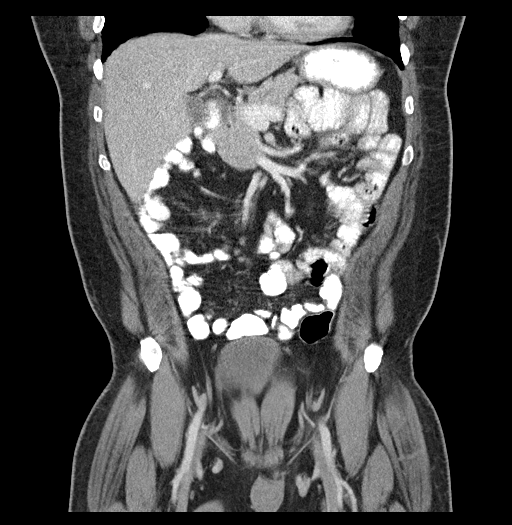
[im 33/74  soft-tissue]
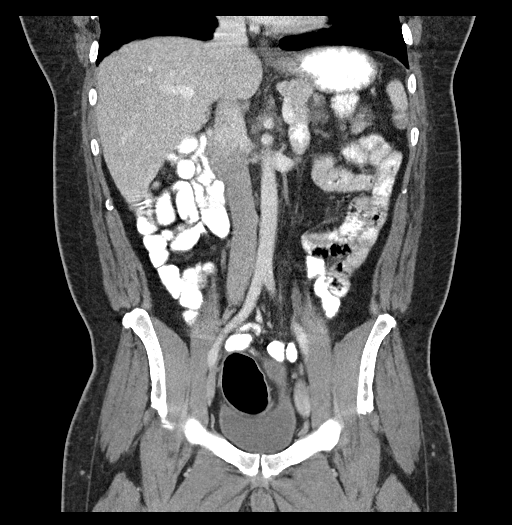
[im 41/74  soft-tissue]
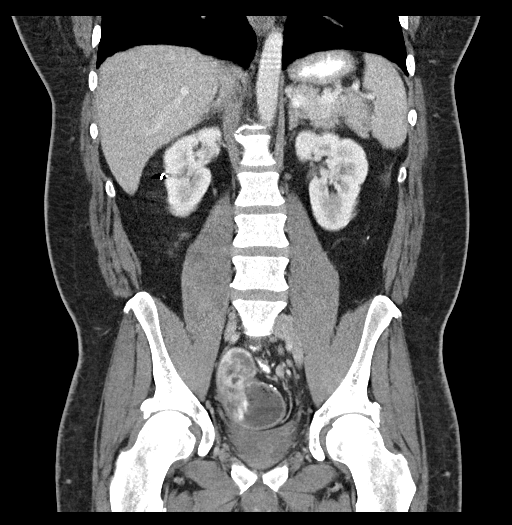

[16 of 46 positions shown; findings below may reference images not displayed]

FINDINGS: The liver, gallbladder, pancreas, spleen, adrenal glands, and
kidneys are normal in appearance. No evidence of hydronephrosis.

Patient has undergone previous colectomy, and a rectal pouch is
seen. Irregular soft tissue thickening is seen along the posterior
wall of the rectal pouch and involving the ileorectal anastomosis,
with oral contrast seen filling 1 or 2 small fistulous tracts in the
presacral region. The fistulous tracts raise suspicion for
inflammatory bowel disease involving the rectal pouch and surgical
anastomosis, with carcinoma considered less likely.

Mild lymphadenopathy is also seen in the sigmoid mesocolon with
largest lymph node measuring 12 mm on image 69 of series 2. This may
be reactive in etiology, although metastatic disease cannot
definitely be excluded.

No other soft tissue masses are identified within the abdomen or
pelvis. No evidence of abscess or free fluid. No evidence of bowel
obstruction.
IMPRESSION: Previous total colectomy with rectal pouch formation.

Irregular soft tissue thickening along the posterior wall of the
rectal pouch and involving the ileorectal anastomosis, with oral
contrast filling 1 or 2 small fistulous tracts in the presacral
region. This is suspicious for inflammatory bowel disease involving
the rectal pouch in surgical anastomosis, although carcinoma cannot
definitely be excluded. Consider proctoscopy for further evaluation.

Mild lymphadenopathy in the sigmoid mesocolon, which may be reactive
in etiology although metastatic disease cannot definitely be
excluded.

No evidence of abscess or bowel obstruction.

These results will be called to the ordering clinician or
representative by the Radiologist Assistant, and communication
documented in the PACS Dashboard.

## 2013-11-19 MED ORDER — IOHEXOL 300 MG/ML  SOLN
80.0000 mL | Freq: Once | INTRAMUSCULAR | Status: AC | PRN
  Administered 2013-11-19: 80 mL via INTRAVENOUS

## 2013-11-22 ENCOUNTER — Other Ambulatory Visit: Payer: Self-pay | Admitting: Gastroenterology

## 2013-11-22 DIAGNOSIS — K610 Anal abscess: Secondary | ICD-10-CM

## 2013-11-22 DIAGNOSIS — K529 Noninfective gastroenteritis and colitis, unspecified: Secondary | ICD-10-CM

## 2013-11-26 ENCOUNTER — Ambulatory Visit (HOSPITAL_COMMUNITY)
Admission: RE | Admit: 2013-11-26 | Discharge: 2013-11-26 | Disposition: A | Discharge status: Home / Self Care | Payer: 59 | Source: Ambulatory Visit | Attending: Gastroenterology | Admitting: Gastroenterology

## 2013-11-26 ENCOUNTER — Encounter (HOSPITAL_COMMUNITY): Payer: Self-pay

## 2013-11-26 DIAGNOSIS — K529 Noninfective gastroenteritis and colitis, unspecified: Secondary | ICD-10-CM

## 2013-11-26 DIAGNOSIS — C6290 Malignant neoplasm of unspecified testis, unspecified whether descended or undescended: Secondary | ICD-10-CM | POA: Insufficient documentation

## 2013-11-26 DIAGNOSIS — K603 Anal fistula, unspecified: Secondary | ICD-10-CM | POA: Insufficient documentation

## 2013-11-26 DIAGNOSIS — K5289 Other specified noninfective gastroenteritis and colitis: Secondary | ICD-10-CM | POA: Insufficient documentation

## 2013-11-26 DIAGNOSIS — K610 Anal abscess: Secondary | ICD-10-CM

## 2013-11-26 IMAGING — CT CT ENTEROGRAPHY (ABD-PELV W/ CM)
2 of 6 series · 4 of 46 positions shown, 5 images · IV contrast (Iodine)
Comparison: [DATE]

CLINICAL DATA: History of inflammatory bowel disease. Perianal
fistula. Prior colectomy. Testicular cancer. Perianal abscess.
Perianal fistula.

EXAM:
CT ABDOMEN AND PELVIS WITH CONTRAST (ENTEROGRAPHY)
TECHNIQUE: Multidetector CT of the abdomen and pelvis during bolus
administration of intravenous contrast. Negative oral contrast
VoLumen was given.
CONTRAST:  100mL OMNIPAQUE IOHEXOL 300 MG/ML  SOLN

[Series 207: coronal · coronal · 0.45mm/px · 3 of 91 slices shown, 4 images]
[im 21/91  soft-tissue]
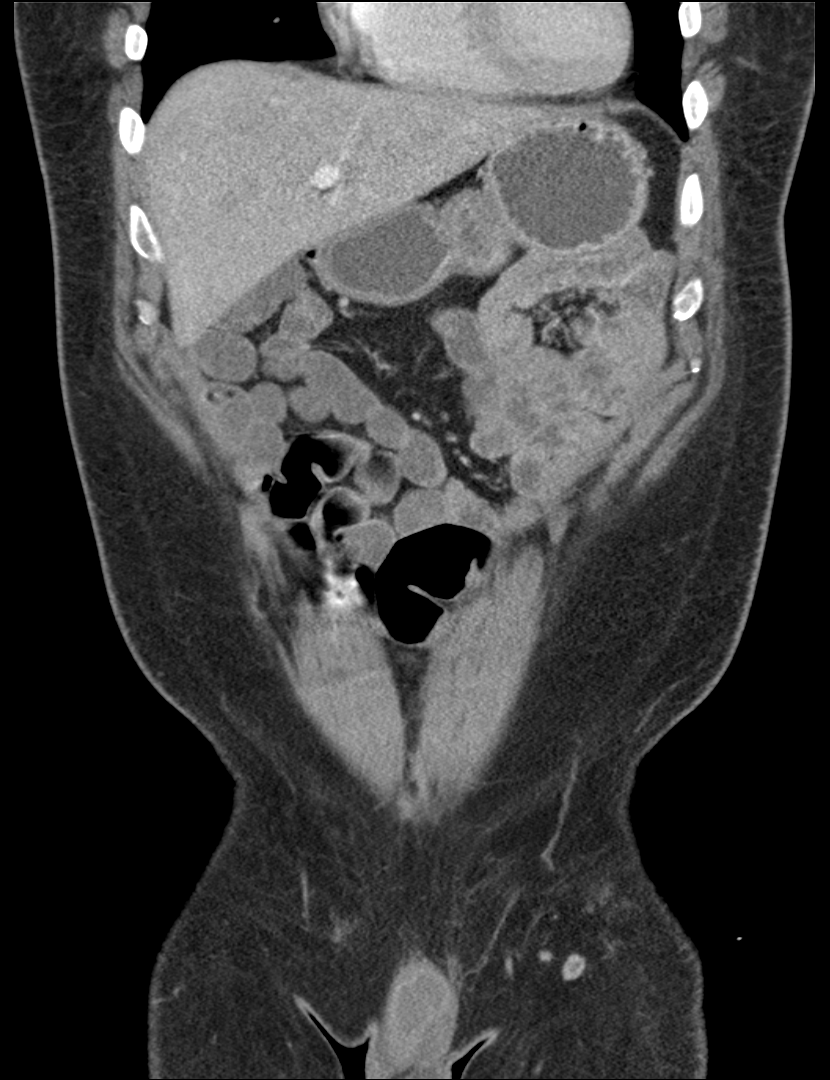
[im 21/91  bone]
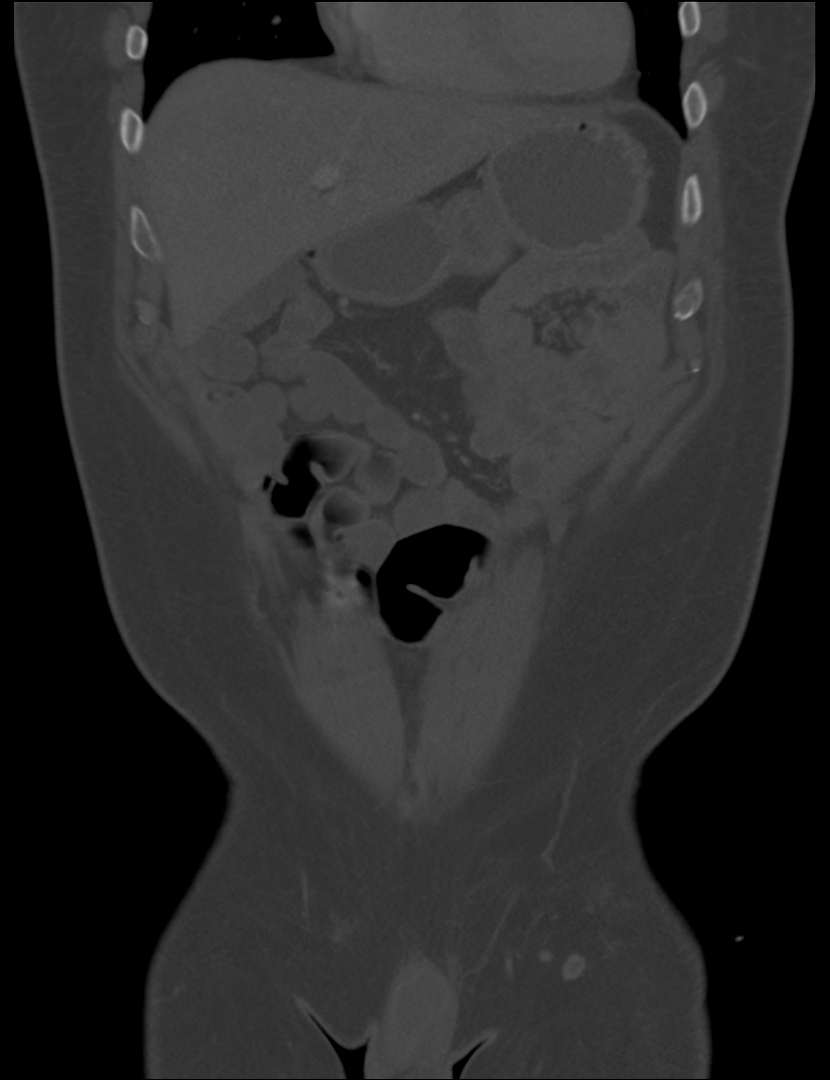
[im 51/91  soft-tissue]
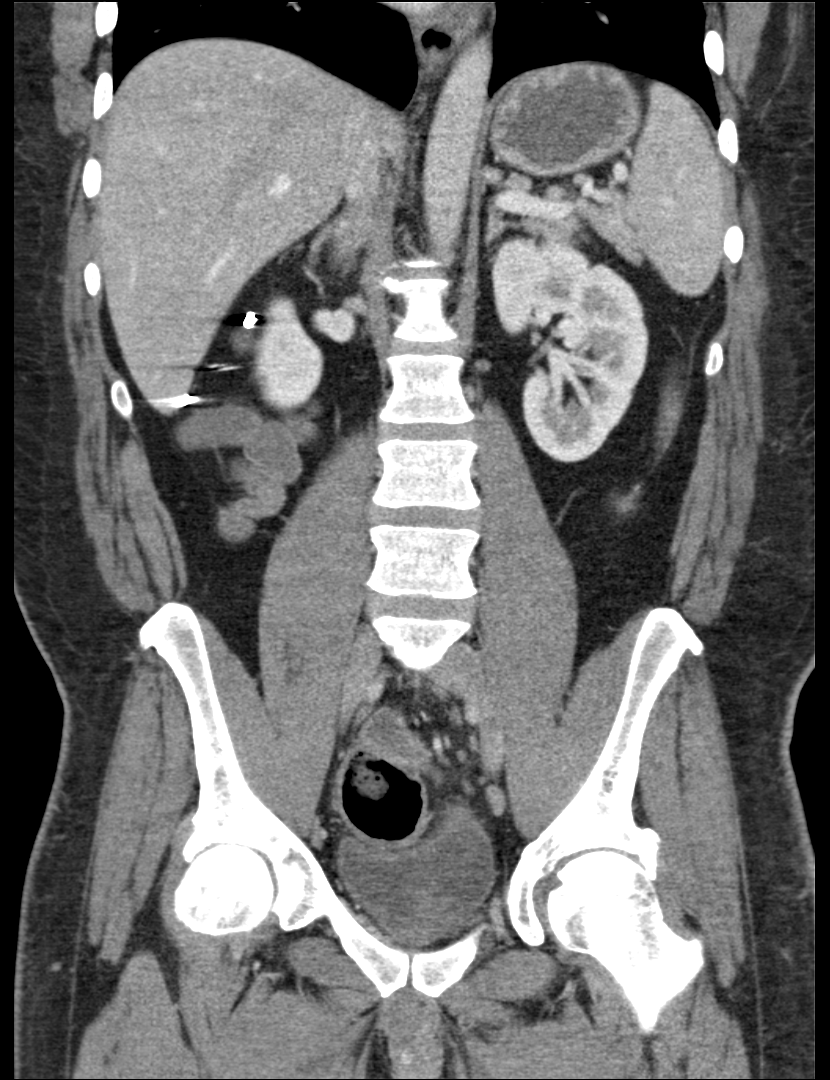
[im 71/91  soft-tissue]
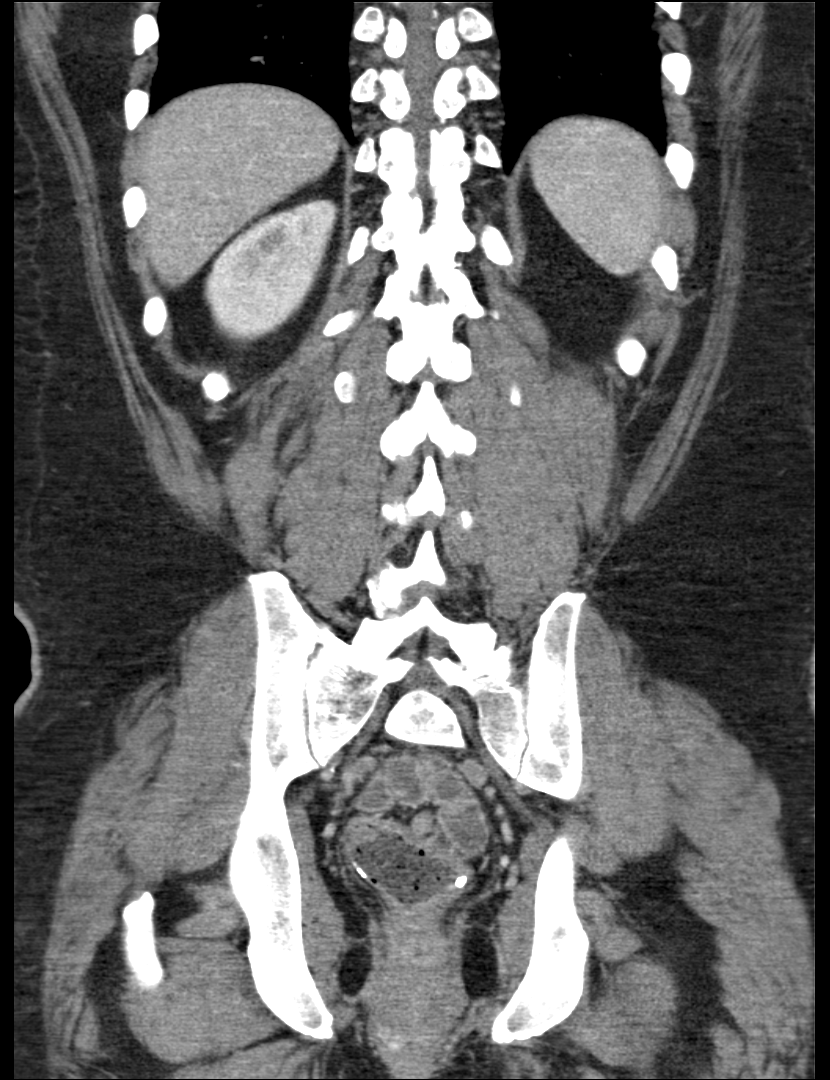

[Series 208: sagittal · sagittal · 0.45mm/px · 1 of 129 slices shown]
[im 43/129  soft-tissue]
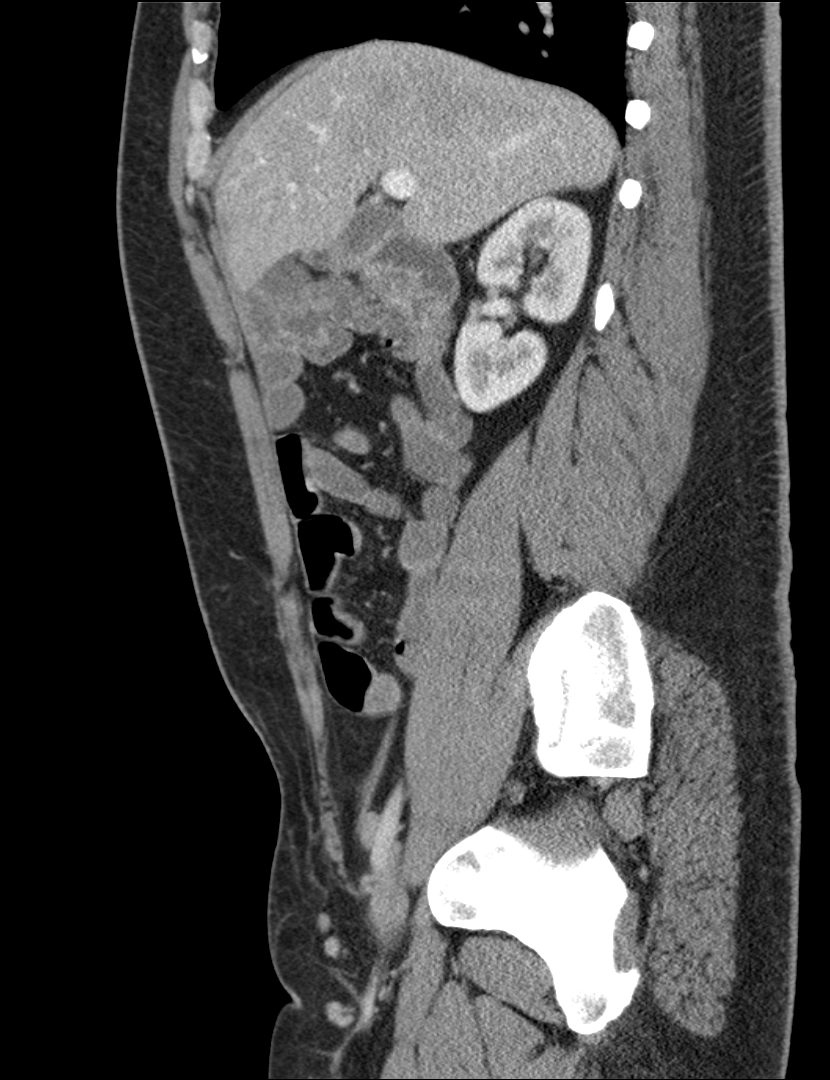

[4 of 46 positions shown; findings below may reference images not displayed]

FINDINGS: Lower Chest: 2 mm right lower lobe lung nodule on image 2/series
203. Present in [3Z], consistent with a benign etiology. Normal
heart size without pericardial or pleural effusion.

Abdomen/Pelvis: Normal liver, spleen, stomach, pancreas,
gallbladder, biliary tract, adrenal glands, kidneys. No
retroperitoneal or retrocrural adenopathy.

Status post near total colectomy with ileoanal anastomosis creation.
The previously questioned fistulous tracts about the posterior
aspect of the anal rectal pouch at the level of the anastomosis are
less apparent do lack of positive contrast. Likely identified on
image 182/ series 202. Wall thickening involves the posterior aspect
of the pouch, as detailed on the recent exam. Example image
184/series 202. No evidence of significant surrounding edema.
Asymmetric soft tissue density is identified about the left
ischioanal fossa. Example image 237/series 202. This soft tissue
thickening is incompletely imaged inferiorly. No well-defined fluid
collection.

Adenopathy is again identified in the sigmoid mesocolon. Example 9
mm. Moderate small bowel distension with neutral contrast. No areas
of small bowel wall thickening, mucosal hyperenhancement, stricture,
or mass. Mildly prominent jejunal mesenteric nodes which are likely
reactive.

1.2 cm left inguinal node is favored to be reactive. 1.1 cm left
external iliac node is new since [DATE]. Normal urinary bladder,
without air within. Normal prostate, without significant free pelvic
fluid.

Bones/Musculoskeletal: Partial fusion of the right sacroiliac joint,
likely degenerative.
IMPRESSION: 1. Status post subtotal colectomy with ileoanal anastomosis.
2. The previously questioned sinus tracts/fistula on the prior
contrast-enhanced CT or less apparent today, secondary to lack of
positive contrast. At least 1 probable sinus tract is again seen.
3. Wall thickening about the rectal pouch, favored to represent
active Crohn disease. Small sigmoid mesocolon nodes which are
favored to be reactive. Consider direct endoscopic visualization of
the ileoanal anastomosis.
4. Soft tissue fullness about the left side of the ischial anal
fossa. Suboptimally evaluated by CT. Given the clinical history,
suspicious for perianal fistula. If this is a clinical concern, the
test of choice is pre and post contrast dedicated high-resolution
pelvic MRI.
5. Mild pelvic adenopathy, favored to be reactive.

## 2013-11-26 MED ORDER — IOHEXOL 300 MG/ML  SOLN
100.0000 mL | Freq: Once | INTRAMUSCULAR | Status: AC | PRN
  Administered 2013-11-26: 100 mL via INTRAVENOUS

## 2013-12-02 ENCOUNTER — Ambulatory Visit (HOSPITAL_COMMUNITY)
Admission: RE | Admit: 2013-12-02 | Discharge: 2013-12-02 | Disposition: A | Discharge status: Home / Self Care | Payer: 59 | Source: Ambulatory Visit | Attending: Gastroenterology | Admitting: Gastroenterology

## 2013-12-02 ENCOUNTER — Other Ambulatory Visit: Payer: Self-pay | Admitting: Gastroenterology

## 2013-12-02 DIAGNOSIS — R7611 Nonspecific reaction to tuberculin skin test without active tuberculosis: Secondary | ICD-10-CM

## 2013-12-02 DIAGNOSIS — M47814 Spondylosis without myelopathy or radiculopathy, thoracic region: Secondary | ICD-10-CM | POA: Insufficient documentation

## 2013-12-02 IMAGING — CR DG CHEST 2V
2 series · 2 of 2 positions shown · non-contrast
Comparison: None.

CLINICAL DATA: Positive TB test

EXAM:
CHEST  2 VIEW

[w chest pa]
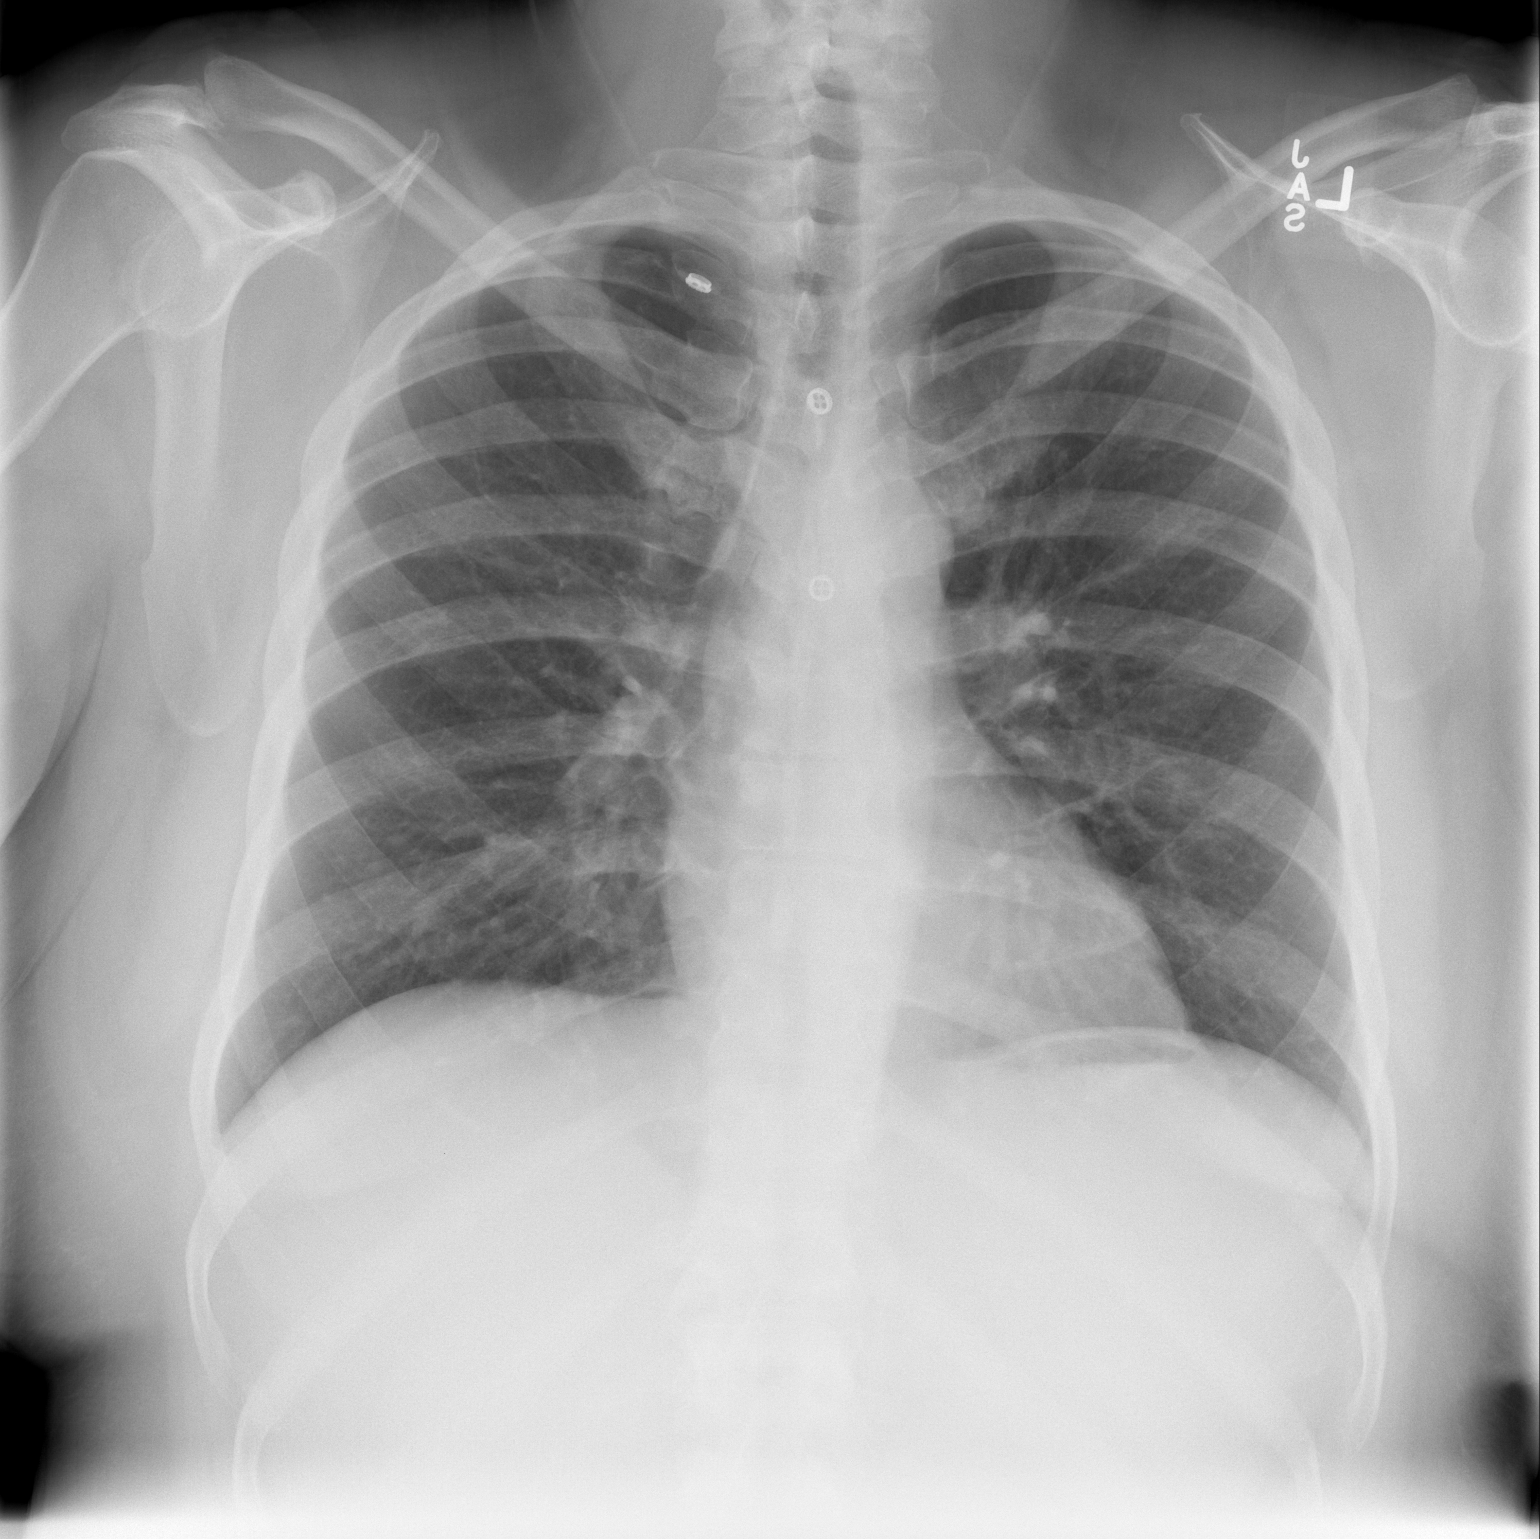

[w chest lat]
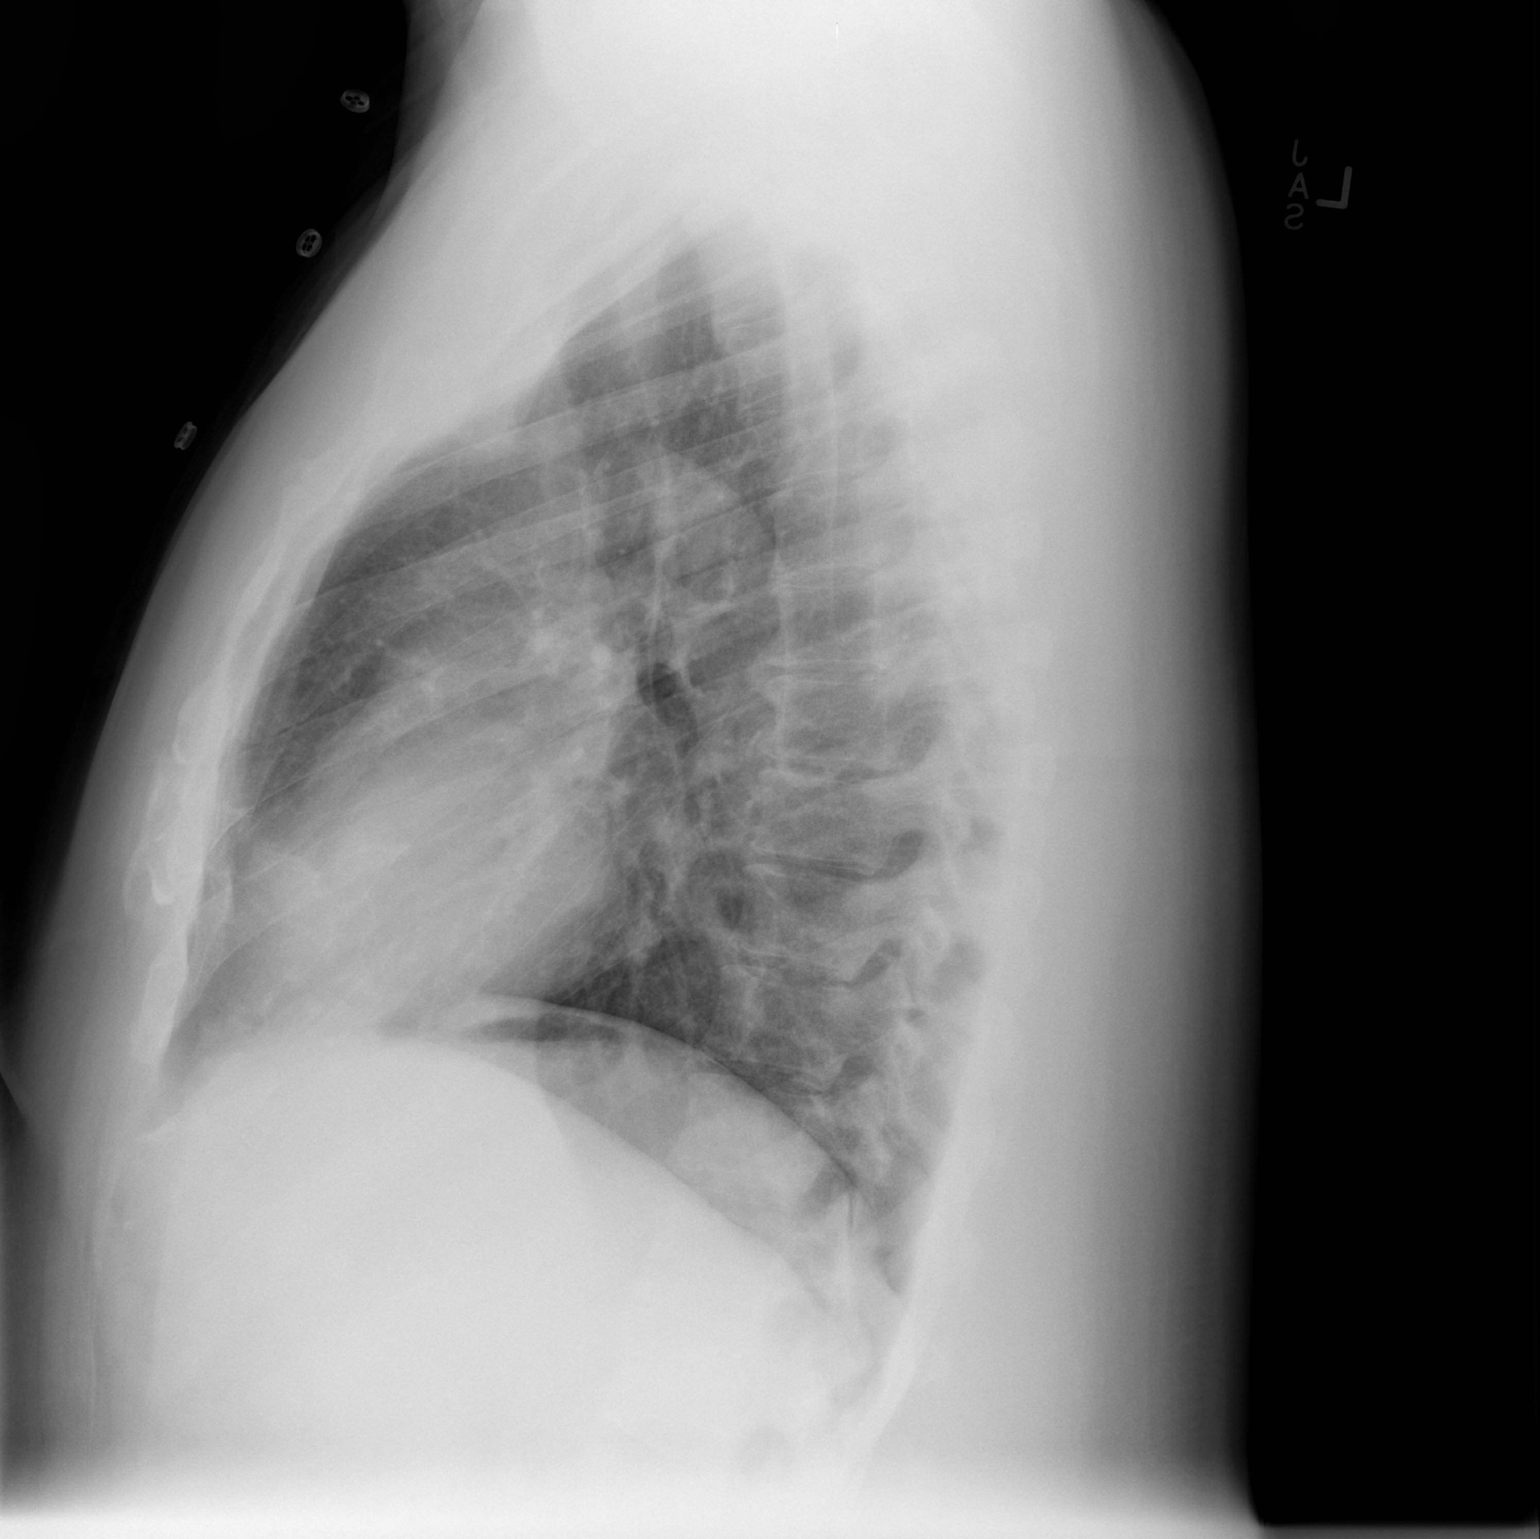

[2 of 2 positions shown; findings below may reference images not displayed]

FINDINGS: Lungs are clear.  No pleural effusion or pneumothorax.

The heart is normal in size.

Mild degenerative changes of the visualized thoracolumbar spine.
IMPRESSION: No evidence of acute cardiopulmonary disease.

## 2013-12-16 ENCOUNTER — Ambulatory Visit (INDEPENDENT_AMBULATORY_CARE_PROVIDER_SITE_OTHER): Payer: 59 | Admitting: Internal Medicine

## 2013-12-16 ENCOUNTER — Encounter: Payer: Self-pay | Admitting: Internal Medicine

## 2013-12-16 ENCOUNTER — Encounter: Payer: Self-pay | Admitting: *Deleted

## 2013-12-16 VITALS — BP 120/86 | HR 92 | Temp 98.8°F | Wt 228.0 lb

## 2013-12-16 DIAGNOSIS — Z227 Latent tuberculosis: Secondary | ICD-10-CM

## 2013-12-16 DIAGNOSIS — R7989 Other specified abnormal findings of blood chemistry: Secondary | ICD-10-CM

## 2013-12-16 DIAGNOSIS — K5090 Crohn's disease, unspecified, without complications: Secondary | ICD-10-CM

## 2013-12-16 DIAGNOSIS — R7611 Nonspecific reaction to tuberculin skin test without active tuberculosis: Secondary | ICD-10-CM

## 2013-12-16 LAB — COMPREHENSIVE METABOLIC PANEL
ALBUMIN: 4.2 g/dL (ref 3.5–5.2)
ALK PHOS: 62 U/L (ref 39–117)
ALT: 22 U/L (ref 0–53)
AST: 19 U/L (ref 0–37)
BILIRUBIN TOTAL: 0.8 mg/dL (ref 0.2–1.2)
BUN: 16 mg/dL (ref 6–23)
CO2: 27 mEq/L (ref 19–32)
Calcium: 9.5 mg/dL (ref 8.4–10.5)
Chloride: 101 mEq/L (ref 96–112)
Creat: 0.9 mg/dL (ref 0.50–1.35)
GLUCOSE: 92 mg/dL (ref 70–99)
POTASSIUM: 4.4 meq/L (ref 3.5–5.3)
Sodium: 138 mEq/L (ref 135–145)
Total Protein: 7.8 g/dL (ref 6.0–8.3)

## 2013-12-16 LAB — CBC WITH DIFFERENTIAL/PLATELET
BASOS PCT: 0 % (ref 0–1)
Basophils Absolute: 0 10*3/uL (ref 0.0–0.1)
EOS ABS: 0.1 10*3/uL (ref 0.0–0.7)
Eosinophils Relative: 2 % (ref 0–5)
HEMATOCRIT: 43 % (ref 39.0–52.0)
HEMOGLOBIN: 15.1 g/dL (ref 13.0–17.0)
Lymphocytes Relative: 20 % (ref 12–46)
Lymphs Abs: 1.4 10*3/uL (ref 0.7–4.0)
MCH: 29.7 pg (ref 26.0–34.0)
MCHC: 35.1 g/dL (ref 30.0–36.0)
MCV: 84.6 fL (ref 78.0–100.0)
MONO ABS: 0.7 10*3/uL (ref 0.1–1.0)
MONOS PCT: 10 % (ref 3–12)
Neutro Abs: 4.6 10*3/uL (ref 1.7–7.7)
Neutrophils Relative %: 68 % (ref 43–77)
Platelets: 201 10*3/uL (ref 150–400)
RBC: 5.08 MIL/uL (ref 4.22–5.81)
RDW: 12.9 % (ref 11.5–15.5)
WBC: 6.8 10*3/uL (ref 4.0–10.5)

## 2013-12-16 MED ORDER — RIFAPENTINE 150 MG PO TABS: 900.0000 mg | ORAL_TABLET | ORAL | Status: DC

## 2013-12-16 MED ORDER — VITAMIN B-6 50 MG PO TABS: 50.0000 mg | ORAL_TABLET | Freq: Every day | ORAL | Status: DC

## 2013-12-16 MED ORDER — ISONIAZID 300 MG PO TABS: 900.0000 mg | ORAL_TABLET | ORAL | Status: DC

## 2013-12-16 MED ORDER — ONDANSETRON HCL 8 MG PO TABS: 8.0000 mg | ORAL_TABLET | Freq: Three times a day (TID) | ORAL | Status: DC | PRN

## 2013-12-17 LAB — HEPATITIS C ANTIBODY: HCV AB: NEGATIVE

## 2013-12-17 LAB — HEPATITIS B SURFACE ANTIBODY,QUALITATIVE: HEP B S AB: NEGATIVE

## 2013-12-17 LAB — HEPATITIS B SURFACE ANTIGEN: HEP B S AG: NEGATIVE

## 2013-12-17 NOTE — Progress Notes (Signed)
Subjective:    Patient ID: Sean Schaefer, male    DOB: 11-Mar-1966, 48 y.o.   MRN: [id number]  HPI 48yo M who hx of crohns vs. UC s/p total proctocolectomy with ileal pouch-anal anastomsis in 1988. In early may he had enterography and on going sinush tract plus rectal pouch wall thickening concering for active crohns. He is undergoing evaluation for possibly use of biologics for managmenet of crohn's disease.he had quantiferon testing positive. cxr negative. No current symptoms to suggest active TB. He has no history of incarceration, international travel to tb endemic countries, does not work with immigrant or high risk populations. He is Scientist, clinical (histocompatibility and immunogenetics) at [employer]. He is in overall good state of health except for recent GI issues. No known exposure to TB. He is referred to our clinic by Dr. Collene Mares for management of ltbi. He had hiv testing which was negative.  No Known Allergies Current Outpatient Prescriptions on File Prior to Visit  Medication Sig Dispense Refill  . ibuprofen (ADVIL,MOTRIN) 200 MG tablet Take 200 mg by mouth every 6 (six) hours as needed for pain.      Marland Kitchen lidocaine (XYLOCAINE) 5 % ointment Apply topically 3 (three) times daily as needed.  35.44 g  2  . metroNIDAZOLE (FLAGYL) 500 MG tablet Take 1 tablet (500 mg total) by mouth 3 (three) times daily.  42 tablet  2   No current facility-administered medications on file prior to visit.   Active Ambulatory Problems    Diagnosis Date Noted  . No Active Ambulatory Problems   Resolved Ambulatory Problems    Diagnosis Date Noted  . No Resolved Ambulatory Problems   Past Medical History  Diagnosis Date  . Diabetes mellitus without complication    History  Substance Use Topics  . Smoking status: Never Smoker   . Smokeless tobacco: Never Used  . Alcohol Use: No  family history includes Cancer in his mother; Hypertension in his father and mother.    Review of Systems   Constitutional: Negative for fever,  chills, diaphoresis, activity change, appetite change, fatigue and unexpected weight change.  HENT: Negative for congestion, sore throat, rhinorrhea, sneezing, trouble swallowing and sinus pressure.  Eyes: Negative for photophobia and visual disturbance.  Respiratory: Negative for cough, chest tightness, shortness of breath, wheezing and stridor.  Cardiovascular: Negative for chest pain, palpitations and leg swelling.  Gastrointestinal: Negative for nausea, vomiting, abdominal pain, diarrhea, constipation, blood in stool, abdominal distention and anal bleeding.  Genitourinary: Negative for dysuria, hematuria, flank pain and difficulty urinating.  Musculoskeletal: Negative for myalgias, back pain, joint swelling, arthralgias and gait problem.  Skin: Negative for color change, pallor, rash and wound.  Neurological: Negative for dizziness, tremors, weakness and light-headedness.  Hematological: Negative for adenopathy. Does not bruise/bleed easily.  Psychiatric/Behavioral: Negative for behavioral problems, confusion, sleep disturbance, dysphoric mood, decreased concentration and agitation.       Objective:   Physical Exam BP 120/86  Pulse 92  Temp(Src) 98.8 F (37.1 C) (Oral)  Wt 228 lb (103.42 kg) Physical Exam  Constitutional: He is oriented to person, place, and time. He appears well-developed and well-nourished. No distress.  HENT:  Mouth/Throat: Oropharynx is clear and moist. No oropharyngeal exudate.  Cardiovascular: Normal rate, regular rhythm and normal heart sounds. Exam reveals no gallop and no friction rub.  No murmur heard.  Pulmonary/Chest: Effort normal and breath sounds normal. No respiratory distress. He has no wheezes.  Lymphadenopathy:  He has no cervical adenopathy.  Neurological: He  is alert and oriented to person, place, and time.  Skin: Skin is warm and dry. No rash noted. No erythema.  Psychiatric: He has a normal mood and affect. His behavior is normal.    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 He remembers having skin tested many years ago, roughly 10 but nothing recently. No active pulmonary symptoms or systemic complaints to suggest active tb. We have discussed various options to treat ltbi. We will do 12 weekly dosing regimen of rifapentin 954m weekly plus inh 9065mweekly, plus daily vitamin b6. He is to contact me after every dose as modified DOT regimen recommended by CDC. He understands that he will need to contact me after every dose so that it is documented in his chart. If he has side effects he is to contact us[place]orea[place] rx for zofran in case of nausea. Will check cmp in 2-3 wk.

## 2013-12-18 DIAGNOSIS — Z227 Latent tuberculosis: Secondary | ICD-10-CM

## 2013-12-18 DIAGNOSIS — K5090 Crohn's disease, unspecified, without complications: Secondary | ICD-10-CM | POA: Insufficient documentation

## 2013-12-18 HISTORY — DX: Latent tuberculosis: Z22.7

## 2013-12-30 ENCOUNTER — Other Ambulatory Visit: Payer: 59

## 2013-12-30 DIAGNOSIS — R7989 Other specified abnormal findings of blood chemistry: Secondary | ICD-10-CM

## 2013-12-31 LAB — COMPREHENSIVE METABOLIC PANEL
ALK PHOS: 54 U/L (ref 39–117)
ALT: 21 U/L (ref 0–53)
AST: 20 U/L (ref 0–37)
Albumin: 4.4 g/dL (ref 3.5–5.2)
BILIRUBIN TOTAL: 0.6 mg/dL (ref 0.2–1.2)
BUN: 13 mg/dL (ref 6–23)
CALCIUM: 9.7 mg/dL (ref 8.4–10.5)
CHLORIDE: 101 meq/L (ref 96–112)
CO2: 27 mEq/L (ref 19–32)
CREATININE: 0.89 mg/dL (ref 0.50–1.35)
Glucose, Bld: 130 mg/dL — ABNORMAL HIGH (ref 70–99)
Potassium: 4.4 mEq/L (ref 3.5–5.3)
Sodium: 137 mEq/L (ref 135–145)
Total Protein: 8.2 g/dL (ref 6.0–8.3)

## 2014-02-01 ENCOUNTER — Ambulatory Visit: Payer: 59 | Admitting: Internal Medicine

## 2014-02-15 ENCOUNTER — Encounter: Payer: Self-pay | Admitting: Internal Medicine

## 2014-02-15 ENCOUNTER — Ambulatory Visit (INDEPENDENT_AMBULATORY_CARE_PROVIDER_SITE_OTHER): Payer: 59 | Admitting: Internal Medicine

## 2014-02-15 VITALS — BP 145/90 | HR 97 | Temp 97.8°F | Wt 228.0 lb

## 2014-02-15 DIAGNOSIS — Z227 Latent tuberculosis: Secondary | ICD-10-CM

## 2014-02-15 DIAGNOSIS — R7611 Nonspecific reaction to tuberculin skin test without active tuberculosis: Secondary | ICD-10-CM

## 2014-02-15 LAB — COMPLETE METABOLIC PANEL WITH GFR
ALT: 21 U/L (ref 0–53)
AST: 18 U/L (ref 0–37)
Albumin: 4 g/dL (ref 3.5–5.2)
Alkaline Phosphatase: 52 U/L (ref 39–117)
BILIRUBIN TOTAL: 0.7 mg/dL (ref 0.2–1.2)
BUN: 10 mg/dL (ref 6–23)
CALCIUM: 9.5 mg/dL (ref 8.4–10.5)
CO2: 29 meq/L (ref 19–32)
CREATININE: 0.82 mg/dL (ref 0.50–1.35)
Chloride: 102 mEq/L (ref 96–112)
GLUCOSE: 100 mg/dL — AB (ref 70–99)
Potassium: 4.3 mEq/L (ref 3.5–5.3)
Sodium: 138 mEq/L (ref 135–145)
Total Protein: 7.8 g/dL (ref 6.0–8.3)

## 2014-02-15 NOTE — Progress Notes (Signed)
   Subjective:    Patient ID: Sean Schaefer, male    DOB: 05-30-1966, 48 y.o.   MRN: [id number]  HPI 48yo M who hx of crohns vs. UC s/p total proctocolectomy with ileal pouch-anal anastomsis in 1988. In early may he had enterography and on going sinush tract plus rectal pouch wall thickening concering for active crohns. He is undergoing evaluation for possibly use of humara but found to be he  quantiferon testing positive. cxr negative. No current symptoms to suggest active TB. He has no history of incarceration, international travel to tb endemic countries, does not work with immigrant or high risk populations.  No known exposure to TB. He has tolerated 9 wks of inh and rifapentine weekly regimen without difficulty. He only has 3 weeks left. He is planning family road trip to [place] world.  Current Outpatient Prescriptions on File Prior to Visit  Medication Sig Dispense Refill  . ibuprofen (ADVIL,MOTRIN) 200 MG tablet Take 200 mg by mouth every 6 (six) hours as needed for pain.      Marland Kitchen isoniazid (NYDRAZID) 300 MG tablet Take 3 tablets (900 mg total) by mouth once a week.  36 tablet  0  . lidocaine (XYLOCAINE) 5 % ointment Apply topically 3 (three) times daily as needed.  35.44 g  2  . metroNIDAZOLE (FLAGYL) 500 MG tablet Take 1 tablet (500 mg total) by mouth 3 (three) times daily.  42 tablet  2  . ondansetron (ZOFRAN) 8 MG tablet Take 1 tablet (8 mg total) by mouth every 8 (eight) hours as needed for nausea or vomiting.  20 tablet  0  . pyridOXINE (VITAMIN B-6) 50 MG tablet Take 1 tablet (50 mg total) by mouth daily.  90 tablet  0  . Rifapentine 150 MG TABS Take 6 tablets (900 mg total) by mouth once a week.  72 tablet  0   No current facility-administered medications on file prior to visit.   Active Ambulatory Problems    Diagnosis Date Noted  . 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 Crohn's disease 12/18/2013   Resolved Ambulatory Problems    Diagnosis Date Noted  . No  Resolved Ambulatory Problems   Past Medical History  Diagnosis Date  . Diabetes mellitus without complication       Review of Systems 10 point ros is negative    Objective:   Physical Exam BP 145/90  Pulse 97  Temp(Src) 97.8 F (36.6 C) (Oral)  Wt 228 lb (103.42 kg) No exam  Labs: CMP     Component Value Date/Time   NA 138 02/15/2014 1736   K 4.3 02/15/2014 1736   CL 102 02/15/2014 1736   CO2 29 02/15/2014 1736   GLUCOSE 100* 02/15/2014 1736   BUN 10 02/15/2014 1736   CREATININE 0.82 02/15/2014 1736   CALCIUM 9.5 02/15/2014 1736   PROT 7.8 02/15/2014 1736   ALBUMIN 4.0 02/15/2014 1736   AST 18 02/15/2014 1736   ALT 21 02/15/2014 1736   ALKPHOS 52 02/15/2014 1736   BILITOT 0.7 02/15/2014 1736   GFRNONAA >89 02/15/2014 1736   GFRAA >89 02/15/2014 1736       Assessment & Plan:  Latent tb infection = will check cmp to ensure no transaminitis from rifapentine. Anticipate that he will be able finish the regimen without delay. He is cleared to start humara for chron's disease.

## 2014-11-28 ENCOUNTER — Other Ambulatory Visit: Payer: Self-pay

## 2014-11-28 NOTE — Patient Outreach (Signed)
[facility]) Care Management  11/28/2014  [name] [birth year] [id number]   Phone call to schedule appointment.  No answer and message left.  Plan to send appointment request letter.   Peter Garter RN, [facility] Care Management Coordinator-[facility] to [place] [facility] [phone]

## 2014-12-26 ENCOUNTER — Other Ambulatory Visit: Payer: Self-pay

## 2014-12-26 VITALS — BP 130/82 | HR 85 | Ht 71.0 in | Wt 240.8 lb

## 2014-12-26 DIAGNOSIS — E119 Type 2 diabetes mellitus without complications: Secondary | ICD-10-CM | POA: Insufficient documentation

## 2014-12-26 NOTE — Patient Outreach (Signed)
[facility]) Care Management   12/26/2014  Sean Schaefer 07/25/1965 [id number]  Sean Schaefer is an 49 y.o. male.   Member seen for follow up office visit for [facility] program for self management of Type 2 diabetes  Subjective: States that he had been under a lot of stress for the last few months with his job and health issues.  States he was off of his diabetes medication for 3 weeks as his provider would not refill until he was seen by her.  States that he was also not eating right or exercising during that time.  States that his hemoglobin A1C went up to 9.  States that he now has a new job that he likes and it allows him to eat regularly and walk while he is on the job.  States that his stress level has improved.  States he needs to go back to see Dr.Mann for his ulcerative colitis.  States that he thinks the Humira has helped with his symptoms.  States that he has had a problem getting his Humira filled at the pharmacy on a regular schedule.  States that he has lost his glucometer over the weekend when he was out of town.  Objective:   ROS  Physical Exam  Filed Vitals:   12/26/14 1411  BP: 130/82  Pulse: 85   Filed Weights   12/26/14 1411  Weight: 240 lb 12.8 oz (109.226 kg)    Current Medications:   Current Outpatient Prescriptions  Medication Sig Dispense Refill  . Adalimumab 40 MG/0.8ML PSKT Inject 1 each into the skin.    Marland Kitchen amLODipine (NORVASC) 10 MG tablet Take 10 mg by mouth daily.    Marland Kitchen glipiZIDE (GLUCOTROL XL) 10 MG 24 hr tablet Take 10 mg by mouth daily with breakfast.    . ibuprofen (ADVIL,MOTRIN) 200 MG tablet Take 200 mg by mouth every 6 (six) hours as needed for pain.    . ramipril (ALTACE) 10 MG capsule Take 10 mg by mouth daily.    Marland Kitchen isoniazid (NYDRAZID) 300 MG tablet Take 3 tablets (900 mg total) by mouth once a week. (Patient not taking: Reported on 12/26/2014) 36 tablet 0  . lidocaine (XYLOCAINE) 5 % ointment Apply topically 3 (three)  times daily as needed. (Patient not taking: Reported on 12/26/2014) 35.44 g 2  . metroNIDAZOLE (FLAGYL) 500 MG tablet Take 1 tablet (500 mg total) by mouth 3 (three) times daily. (Patient not taking: Reported on 12/26/2014) 42 tablet 2  . ondansetron (ZOFRAN) 8 MG tablet Take 1 tablet (8 mg total) by mouth every 8 (eight) hours as needed for nausea or vomiting. (Patient not taking: Reported on 12/26/2014) 20 tablet 0  . pyridOXINE (VITAMIN B-6) 50 MG tablet Take 1 tablet (50 mg total) by mouth daily. (Patient not taking: Reported on 12/26/2014) 90 tablet 0  . Rifapentine 150 MG TABS Take 6 tablets (900 mg total) by mouth once a week. (Patient not taking: Reported on 12/26/2014) 72 tablet 0   No current facility-administered medications for this visit.    Functional Status:   In your present state of health, do you have any difficulty performing the following activities: 12/26/2014  Hearing? N  Vision? N  Difficulty concentrating or making decisions? N  Walking or climbing stairs? N  Dressing or bathing? N  Doing errands, shopping? N    Fall/Depression Screening:    PHQ 2/9 Scores 12/26/2014 02/15/2014 12/16/2013 12/16/2013  PHQ - 2 Score 0 0  0 0   [facility] CM Care Plan Problem One        Patient Outreach from 12/26/2014 in Lupus Problem One  Elevated blood sugars related to dx of Type 2 DM   Care Plan for Problem One  Active   [facility] Long Term Goal (31-90 days)  Member will decrease hemoglobin A1C to 7 or below within the next 90 days   [facility] Long Term Goal Start Date  12/26/14   Interventions for Problem One Long Term Goal  Reinforced CHO counting and portion control, Reinforced importance of regular exercise for glycemic control, Instructed on importance of taking medication daily as ordered and to see his provider regularly, Called outpatient pharmacy and discussed issues with Humira refills, Issued new True Result glucometer to replace his lost machine, Instructed to check blood  sugars at least daily fasting or post prandial      Assessment:  Member presents to [facility] for self management of Type 2 DM.  Member has not been meeting self management goals as his last hemoglobin A1C had increased to 9.  Member had period when he was not taking his diabetes medication and watching his diet.  Member had had issues with getting refills on his Humira.  After speaking with the outpatient pharmacy manager about issues they will increase their level of medication on hand to see if this solves the issue.  Member given new glucometer to replace his lost meter.  Member is more motivated to return to good glycemic control.  Plan:  Plan to eat 60-75 gm of carbohydrates a meal and 15gm for snacks.  Try to eat more protein with meals and snacks. Plan to check blood sugar 1-2 times a day fasting or 1 - 2 hours after eating.  Goals of 80-130 before meals and less than 180 after meals. Plan to walk 5 days a week with goal of at least 150 minutes a week.  Plan to go swim at [place] on Fridays for 60 minutes Plan to keep  eye appointment on 01/10/15 Plan to call Dr. Collene Mares to schedule follow up appt next week Plan to return to [facility] on 02/13/15 at 2 PM [place], [facility] Care Management Coordinator-Link to [place] [facility] [phone]

## 2014-12-27 NOTE — Patient Instructions (Signed)
1. Plan to eat 60-75 gm of carbohydrates a meal and 15gm for snacks.  Try to eat more protein with meals and snacks. 2. Plan to check blood sugar 1-2 times a day fasting or 1 - 2 hours after eating.  Goals of 80-130 before meals and less than 180 after meals. 3. Plan to walk 5 days a week with goal of at least 150 minutes a week.  Plan to go swim at [place] on Fridays for 60 minutes 4. Plan to keep  eye appointment on 01/10/15 5. Plan to call Dr. Collene Mares to schedule follow up appt next week 6. Plan to return to Link to Wellness on 02/13/15 at 2 PM

## 2015-02-13 ENCOUNTER — Other Ambulatory Visit: Payer: Self-pay

## 2015-02-13 VITALS — BP 110/80 | HR 98 | Resp 16 | Ht 71.0 in | Wt 243.6 lb

## 2015-02-13 DIAGNOSIS — E119 Type 2 diabetes mellitus without complications: Secondary | ICD-10-CM

## 2015-02-13 NOTE — Patient Outreach (Signed)
[facility]) Care Management   02/13/2015  [name] Renaldo Harrison May 07, 1966 [id number]  [name] is an 49 y.o. male.  Member seen for follow up office visit for Link to Wellness program for self management of Type 2 diabetes  Subjective: Member states that he has not been watching his diet very well.  States he has been drinking lemonade and root beer some days.  States that his blood sugars have been higher.  States he had to reschedule his eye appointment to 02/27/15.  Member states that he is now getting his Humira from the pharmacy without difficulty.  Objective:   Review of Systems  All other systems reviewed and are negative. Reviewed glucometer 7 day average-217 14 day average-259 30 day average-259  Physical Exam  Today's Vitals   02/13/15 1408  BP: 110/80  Pulse: 98  Resp: 16  Height: 1.803 m (5' 11" )  Weight: 243 lb 9.6 oz (110.496 kg)  SpO2: 97%  PainSc: 0-No pain    Current Medications:   Current Outpatient Prescriptions  Medication Sig Dispense Refill  . Adalimumab 40 MG/0.8ML PSKT Inject 1 each into the skin.    Marland Kitchen amLODipine (NORVASC) 10 MG tablet Take 10 mg by mouth daily.    Marland Kitchen glipiZIDE (GLUCOTROL XL) 10 MG 24 hr tablet Take 10 mg by mouth daily with breakfast.    . ibuprofen (ADVIL,MOTRIN) 200 MG tablet Take 200 mg by mouth every 6 (six) hours as needed for pain.    . ramipril (ALTACE) 10 MG capsule Take 10 mg by mouth daily.    Marland Kitchen isoniazid (NYDRAZID) 300 MG tablet Take 3 tablets (900 mg total) by mouth once a week. (Patient not taking: Reported on 12/26/2014) 36 tablet 0  . lidocaine (XYLOCAINE) 5 % ointment Apply topically 3 (three) times daily as needed. (Patient not taking: Reported on 12/26/2014) 35.44 g 2  . metroNIDAZOLE (FLAGYL) 500 MG tablet Take 1 tablet (500 mg total) by mouth 3 (three) times daily. (Patient not taking: Reported on 12/26/2014) 42 tablet 2  . ondansetron (ZOFRAN) 8 MG tablet Take 1 tablet (8 mg total) by mouth every 8 (eight)  hours as needed for nausea or vomiting. (Patient not taking: Reported on 12/26/2014) 20 tablet 0  . pyridOXINE (VITAMIN B-6) 50 MG tablet Take 1 tablet (50 mg total) by mouth daily. (Patient not taking: Reported on 12/26/2014) 90 tablet 0  . Rifapentine 150 MG TABS Take 6 tablets (900 mg total) by mouth once a week. (Patient not taking: Reported on 12/26/2014) 72 tablet 0   No current facility-administered medications for this visit.    Functional Status:   In your present state of health, do you have any difficulty performing the following activities: 02/13/2015 12/26/2014  Hearing? N N  Vision? N N  Difficulty concentrating or making decisions? N N  Walking or climbing stairs? N N  Dressing or bathing? N N  Doing errands, shopping? N N    Fall/Depression Screening:    PHQ 2/9 Scores 02/13/2015 12/26/2014 02/15/2014 12/16/2013 12/16/2013  PHQ - 2 Score 0 0 0 0 0   THN CM Care Plan Problem One        Patient Outreach from 02/13/2015 in [place] Problem One  Elevated blood sugars related to dx of Type 2 DM   Care Plan for Problem One  Active   THN Long Term Goal (31-90 days)  Member will decrease hemoglobin A1C to 7 or below within  the next 90 days   THN Long Term Goal Start Date  02/13/15 [Continues to have high CBGs not due for Hemoglobin A1C ]   Interventions for Problem One Long Term Goal  Reinforced CHO counting and portion control,Discussed with member about going back to see RD to help develop meal plans, [facility] to [facility] sent, Reinforced importance of regular exercise for glycemic control, Reinforced  to see his provider regularly, Instructed on importance of keeping blood sugars in control and how elevated blood sugars cause long term complications, Reinforced to check blood sugars at least daily fasting or post prandial      Assessment:  Member seen for follow up office visit for Link to Wellness program for self management of  Type 2 diabetes.  Member reports not adhering to low CHO diet and is drinking regular lemonade and root beer.  Review of glucometer with elevated CBG's.  Member has eye exam scheduled for 02/27/15.  He does not have an appointment scheduled with his primary provider at this time.  He has been walking daily but has not returned to swimming at the [facility] as planned.  Member wants to see RD about meal planning.   Plan:  1. Plan to eat 60-75gm (4-5 servings) of carbohydrates a meal and 15gm for snacks.  Try to eat more protein with meals and snacks. Plan to limit drinking lemonade and root beer 2. Plan to [facility] 3. Plan to check blood sugar 1-2 times a day fasting or 1 - 2 hours after eating.  Goals of 80-130 before meals and less than 180 after meals. 4. Plan to walk 5 days a week with goal of at least 150 minutes a week.  Plan to go swim at [place] on Fridays for 60 minutes 5. Plan to keep  eye appointment on 02/27/15 6. Plan to complete EMMI programs by 03/21/15 7. Plan to make follow up appointment with primary provider 8. Plan to return to [facility] to Wellness on 03/21/15    Peter Garter RN, [facility] Care Management Coordinator-Link to [place] Management [phone]

## 2015-02-13 NOTE — Patient Instructions (Signed)
1. Plan to eat 60-75gm (4-5 servings) of carbohydrates a meal and 15gm for snacks.  Try to eat more protein with meals and snacks. Plan to limit drinking lemonade and root beer 2. Plan to [facility] 3. Plan to check blood sugar 1-2 times a day fasting or 1 - 2 hours after eating.  Goals of 80-130 before meals and less than 180 after meals. 4. Plan to walk 5 days a week with goal of at least 150 minutes a week.  Plan to go swim at [place] on Fridays for 60 minutes 5. Plan to keep  eye appointment on 02/27/15 6. Plan to complete EMMI programs by 03/21/15 7. Plan to make follow up appointment with primary provider 8. Plan to return to Link to Wellness on 03/21/15 at [facility] at [place] office [phone]

## 2015-03-21 ENCOUNTER — Ambulatory Visit: Payer: 59

## 2015-05-01 ENCOUNTER — Other Ambulatory Visit: Payer: Self-pay

## 2015-05-01 VITALS — BP 120/86 | HR 88 | Resp 14 | Ht 71.0 in | Wt 238.4 lb

## 2015-05-01 DIAGNOSIS — E119 Type 2 diabetes mellitus without complications: Secondary | ICD-10-CM

## 2015-05-01 LAB — POCT GLYCOSYLATED HEMOGLOBIN (HGB A1C): Hemoglobin A1C: 8.8

## 2015-05-01 NOTE — Patient Instructions (Signed)
  Plan to eat 60-75gm (4-5 servings) of carbohydrates a meal and 15gm for snacks.  Try to eat more protein with meals and snacks.  Plan to reschedule visit with RD at [facility] [place] to check blood sugar 1-2 times a day fasting or 1 - 2 hours after eating.  Goals of 80-130 before meals and less than 180 after meals. Plan to walk 5 days a week with goal of at least 150 minutes a week.  Plan to go swim at [place] on Fridays for 60 minutes Plan to reschedule  eye appointment Plan to complete EMMI programs by 06/21/15 Plan to make follow up appointment with primary provider. Plan to get refill of medication today Plan to return to Link to Wellness on 06/01/15 at 11AM

## 2015-05-01 NOTE — Patient Outreach (Signed)
[facility]) Care Management   05/01/2015  Sean Schaefer Sean Schaefer Oct 21, 1965 [id number]  Sean Schaefer is an 49 y.o. male.   Member seen for follow up office visit for Link to Wellness program for self management of Type 2 diabetes  Subjective:  Member states he has not been taking his Humira or his glipizide for the last month.  States that his RX for his glipizide ran out and he his MD will not refill until he has seen her.  States that he has tried to call to schedule an appointment but he has not been able to get an appointment scheduled yet.  States he stopped taking the Humira because he did not feel that it was helping his Crohn's disease.  States he needs to go back to see Dr.Mann to see if there is another treatment that can help him.  States he has not been eating regularly and that he only eats one good meal a day and he eats snacks of protein a few times a day.   Objective:   ROS  Reviewed glucometer but no readings since 02/20/15  Physical Exam Today's Vitals   05/01/15 1018  BP: 120/86  Pulse: 88  Resp: 14  Height: 1.803 m (5' 11" )  Weight: 238 lb 6.4 oz (108.138 kg)  SpO2: 97%  PainSc: 0-No pain  POC hemoglobin A1C -8.8 POC CBG-359 2 hours post prandial Current Medications:   Current Outpatient Prescriptions  Medication Sig Dispense Refill  . amLODipine (NORVASC) 10 MG tablet Take 10 mg by mouth daily.    Marland Kitchen glipiZIDE (GLUCOTROL XL) 10 MG 24 hr tablet Take 10 mg by mouth daily with breakfast.    . ibuprofen (ADVIL,MOTRIN) 200 MG tablet Take 200 mg by mouth every 6 (six) hours as needed for pain.    . Adalimumab 40 MG/0.8ML PSKT Inject 1 each into the skin.    Marland Kitchen isoniazid (NYDRAZID) 300 MG tablet Take 3 tablets (900 mg total) by mouth once a week. (Patient not taking: Reported on 12/26/2014) 36 tablet 0  . lidocaine (XYLOCAINE) 5 % ointment Apply topically 3 (three) times daily as needed. (Patient not taking: Reported on 12/26/2014) 35.44 g 2  . metroNIDAZOLE  (FLAGYL) 500 MG tablet Take 1 tablet (500 mg total) by mouth 3 (three) times daily. (Patient not taking: Reported on 12/26/2014) 42 tablet 2  . ondansetron (ZOFRAN) 8 MG tablet Take 1 tablet (8 mg total) by mouth every 8 (eight) hours as needed for nausea or vomiting. (Patient not taking: Reported on 12/26/2014) 20 tablet 0  . pyridOXINE (VITAMIN B-6) 50 MG tablet Take 1 tablet (50 mg total) by mouth daily. (Patient not taking: Reported on 12/26/2014) 90 tablet 0  . ramipril (ALTACE) 10 MG capsule Take 10 mg by mouth daily.    . Rifapentine 150 MG TABS Take 6 tablets (900 mg total) by mouth once a week. (Patient not taking: Reported on 12/26/2014) 72 tablet 0   No current facility-administered medications for this visit.    Functional Status:   In your present state of health, do you have any difficulty performing the following activities: 02/13/2015 12/26/2014  Hearing? N N  Vision? N N  Difficulty concentrating or making decisions? N N  Walking or climbing stairs? N N  Dressing or bathing? N N  Doing errands, shopping? N N    Fall/Depression Screening:    PHQ 2/9 Scores 05/01/2015 02/13/2015 12/26/2014 02/15/2014 12/16/2013 12/16/2013  PHQ - 2 Score 0 0 0  0 0 0    Assessment:   Member seen for follow up office visit for Link to Wellness program for self management of Type 2 diabetes.  Member not meeting diabetes self management goal with hemoglobin A1C of 8.8.  Member has not been adherent with medications or self monitoring.  Member has not seen his MD recently and he cancelled his eye exam.  Member reports walking 3 times a week but he has not been to the pool.    Plan:  Plan to eat 60-75gm (4-5 servings) of carbohydrates a meal and 15gm for snacks.  Try to eat more protein with meals and snacks.  Plan to reschedule visit with RD at [facility] [place] to check blood sugar 1-2 times a day fasting or 1 - 2 hours after eating.  Goals of 80-130 before meals and less than 180  after meals. Plan to walk 5 days a week with goal of at least 150 minutes a week.  Plan to go swim at [place] on Fridays for 60 minutes Plan to reschedule  eye appointment Plan to complete EMMI programs by 06/21/15 Plan to make follow up appointment with primary provider. Plan to get refill of medication today Plan to return to Link to Wellness on 06/01/15 at [place] Problem One        Most Recent Value   Care Plan Problem One  Elevated blood sugars related to dx of Type 2 DM   Role Documenting the Problem One  Care Management [place] for Problem One  Active   THN Long Term Goal (31-90 days)  Member will decrease hemoglobin A1C to 7 or below within the next 90 days   THN Long Term Goal Start Date  05/01/15 [Continues to have elevated Hemoglobin A1C 8.8]   Interventions for Problem One Long Term Goal  Reinforced CHO counting and portion control,Called members provider about getting refill of glipizide to last member until he can see provider, Encouraged to  go back to see RD to help develop meal plans, Reinforced importance of regular exercise for glycemic control, Assigned EMMI programs on eye care and Nutrition, Reinforced  to see his provider regularly, Reinforced on importance of keeping blood sugars in control and how elevated blood sugars cause long term complications, Reinforced to check blood sugars at least daily fasting or post prandial      Peter Garter RN, [facility] Care Management Coordinator-Link to [place] Management [phone]

## 2015-06-01 ENCOUNTER — Other Ambulatory Visit: Payer: 59

## 2015-06-01 NOTE — Patient Outreach (Signed)
[facility]) Care Management  06/01/2015  Sean Schaefer July 27, 1965 [id number]   Member did not show for 06/01/15 appointment.  Called and left message to call to reschedule appointment. Peter Garter RN, [facility] Care Management Coordinator-Link to [place] Management [phone]

## 2015-08-04 ENCOUNTER — Telehealth: Payer: Self-pay

## 2015-10-12 ENCOUNTER — Ambulatory Visit (INDEPENDENT_AMBULATORY_CARE_PROVIDER_SITE_OTHER): Payer: 59 | Admitting: Family Medicine

## 2015-10-12 ENCOUNTER — Other Ambulatory Visit: Payer: Self-pay

## 2015-10-12 VITALS — BP 152/102 | HR 76 | Resp 16 | Ht 71.0 in | Wt 236.8 lb

## 2015-10-12 VITALS — BP 148/90 | HR 82 | Temp 97.9°F | Resp 16 | Ht 69.5 in | Wt 236.0 lb

## 2015-10-12 DIAGNOSIS — E119 Type 2 diabetes mellitus without complications: Secondary | ICD-10-CM

## 2015-10-12 DIAGNOSIS — E1165 Type 2 diabetes mellitus with hyperglycemia: Secondary | ICD-10-CM | POA: Diagnosis not present

## 2015-10-12 DIAGNOSIS — I10 Essential (primary) hypertension: Secondary | ICD-10-CM | POA: Diagnosis not present

## 2015-10-12 DIAGNOSIS — IMO0001 Reserved for inherently not codable concepts without codable children: Secondary | ICD-10-CM

## 2015-10-12 LAB — POCT GLYCOSYLATED HEMOGLOBIN (HGB A1C): Hemoglobin A1C: 13.7

## 2015-10-12 MED ORDER — GLIPIZIDE ER 10 MG PO TB24: 10.0000 mg | ORAL_TABLET | Freq: Every day | ORAL | Status: DC

## 2015-10-12 MED ORDER — AMLODIPINE BESYLATE 10 MG PO TABS: 10.0000 mg | ORAL_TABLET | Freq: Every day | ORAL | Status: DC

## 2015-10-12 MED ORDER — RAMIPRIL 10 MG PO CAPS: 10.0000 mg | ORAL_CAPSULE | Freq: Every day | ORAL | Status: DC

## 2015-10-12 MED FILL — AMLODIPINE BESYLATE 10 MG T: 10 | 90 days supply | Qty: 90 | Fill #0

## 2015-10-12 MED FILL — RAMIPRIL 10 MG CAPSULE: 10 | 90 days supply | Qty: 90 | Fill #0

## 2015-10-12 MED FILL — glipiZIDE ER 10 MG TB24: 10 | 90 days supply | Qty: 90 | Fill #0

## 2015-10-12 NOTE — Progress Notes (Signed)
This a 50 year old man who has type 2 diabetes. He stopped taking his medicine a few months ago and the blood sugars been climbing ever since. He's also not been sticking to his appropriate diet, eating sweets and snacks.  He has not had his eyes checked  Past Medical History  Diagnosis Date  . Diabetes mellitus without complication ([place])   . Hypertension   . Ulcerative colitis ([place])    Patient has had significant ulcer colitis in the past but this seems to be under control his current regimen.  Objective: BP 148/90 mmHg  Pulse 82  Temp(Src) 97.9 F (36.6 C) (Oral)  Resp 16  Ht 5' 9.5" (1.765 m)  Wt 236 lb (107.049 kg)  BMI 34.36 kg/m2  SpO2 99%  HEENT: Unremarkable Neck: Supple no adenopathy Chest: Clear Heart: Regular no murmur  Assessment: Patient stopped taking his medicine so was blood pressure and diabetes became uncontrolled. He understands the importance of staying on a good regimen. He will be looking for a new physician to help monitor this.   This chart was scribed in my presence and reviewed by me personally.    ICD-9-CM ICD-10-CM   1. Essential hypertension 401.9 I10 amLODipine (NORVASC) 10 MG tablet  2. Uncontrolled type 2 diabetes mellitus without complication, without long-term current use of insulin (HCC) 250.02 E11.65 ramipril (ALTACE) 10 MG capsule     glipiZIDE (GLUCOTROL XL) 10 MG 24 hr tablet     Signed, Robyn Haber, MD

## 2015-10-12 NOTE — Patient Outreach (Signed)
[facility]) Care Management   10/12/2015  Sean Schaefer 03-27-1966 [id number]  Sean Schaefer is an 50 y.o. male.   Member seen for follow up office visit for Link to Wellness program for self management of Type 2 diabetes  Subjective: Member states that he has not been taking any of his medications since November.  States he has not gone to see any doctors and he is still looking for a new provider.  States he wanted to see what his body did without medications and if he really needed the medications.  States he has not been watching his diet or checking his blood sugars.  States that he has been doing circuit training once a week for 30 minutes.  States he has not been checking his blood pressures either.  Objective:   Review of Systems  All other systems reviewed and are negative. POC HemoglobinA1C- 13.7 POC CBG 337 2 hours post prandial  Physical Exam Today's Vitals   10/12/15 0904  BP: 152/102  Pulse: 76  Resp: 16  Height: 1.803 m (5' 11" )  Weight: 236 lb 12.8 oz (107.412 kg)  SpO2: 99%  PainSc: 0-No pain   Current Medications:   Current Outpatient Prescriptions  Medication Sig Dispense Refill  . ibuprofen (ADVIL,MOTRIN) 200 MG tablet Take 200 mg by mouth every 6 (six) hours as needed for pain.    . Adalimumab 40 MG/0.8ML PSKT Inject 1 each into the skin. Reported on 10/12/2015    . amLODipine (NORVASC) 10 MG tablet Take 10 mg by mouth daily. Reported on 10/12/2015    . glipiZIDE (GLUCOTROL XL) 10 MG 24 hr tablet Take 10 mg by mouth daily with breakfast. Reported on 10/12/2015    . isoniazid (NYDRAZID) 300 MG tablet Take 3 tablets (900 mg total) by mouth once a week. (Patient not taking: Reported on 12/26/2014) 36 tablet 0  . lidocaine (XYLOCAINE) 5 % ointment Apply topically 3 (three) times daily as needed. (Patient not taking: Reported on 12/26/2014) 35.44 g 2  . metroNIDAZOLE (FLAGYL) 500 MG tablet Take 1 tablet (500 mg total) by mouth 3 (three) times daily.  (Patient not taking: Reported on 12/26/2014) 42 tablet 2  . ondansetron (ZOFRAN) 8 MG tablet Take 1 tablet (8 mg total) by mouth every 8 (eight) hours as needed for nausea or vomiting. (Patient not taking: Reported on 12/26/2014) 20 tablet 0  . pyridOXINE (VITAMIN B-6) 50 MG tablet Take 1 tablet (50 mg total) by mouth daily. (Patient not taking: Reported on 12/26/2014) 90 tablet 0  . ramipril (ALTACE) 10 MG capsule Take 10 mg by mouth daily. Reported on 10/12/2015    . Rifapentine 150 MG TABS Take 6 tablets (900 mg total) by mouth once a week. (Patient not taking: Reported on 12/26/2014) 72 tablet 0   No current facility-administered medications for this visit.    Functional Status:   In your present state of health, do you have any difficulty performing the following activities: 10/12/2015 02/13/2015  Hearing? N N  Vision? N N  Difficulty concentrating or making decisions? N N  Walking or climbing stairs? N N  Dressing or bathing? N N  Doing errands, shopping? N N    Fall/Depression Screening:    PHQ 2/9 Scores 10/12/2015 05/01/2015 02/13/2015 12/26/2014 02/15/2014 12/16/2013 12/16/2013  PHQ - 2 Score 0 0 0 0 0 0 0    Assessment:  Member seen for follow up office visit for Link to [employer] for self management of  Type 2 diabetes. Member not meeting diabetes self management goal with hemoglobin A1C of 13.7 today and POC CBG 337 2 hours post prandial.  B/P elevated at 152/102 and directed member to go to Urgent Care today to be evaluated and to get medications refilled. Member agrees to go today. Member has not been adherent with medications, diet  or self monitoring and reports not taking any medications for 5 months. Member has not seen a provider  and he cancelled his eye exam. Member reports exercising once a week for 30 minutes. Plan:  Plan to go to Urgent Care after today's visit.  Plan to get medications refilled  Plan to eat 60-75gm (4-5 servings) of carbohydrates a meal and 15gm for  snacks.  Try to eat more protein with meals and snacks. Do no skip meals Plan to check blood sugar 2 times a day fasting (morning) or 1 - 2 hours after eating.  Goals of 80-130 before meals and less than 180 after meals. Plan to exercise twice a week walking or running for 30 minutes and continue circuit training once week.   Plan to check blood pressure daily at different times.  Record on log and bring to next visit Plan to reschedule  eye appointment Plan to complete EMMI programs by 11/20/15 Plan to get refill of medication today Plan to return to Link to Wellness on 11/14/15 at 10:30AM  [facility] CM Care Plan Problem One        Most Recent Value   Care Plan Problem One  Elevated blood sugars related to dx of Type 2 DM   Role Documenting the Problem One  Care Management Coordinator   Care Plan for Problem One  Active   THN Long Term Goal (31-90 days)  Member will decrease hemoglobin A1C to 7 or below within the next 90 days   THN Long Term Goal Start Date  10/12/15 [Elevated Hemoglobin A1C 13.7 today]   Interventions for Problem One Long Term Goal  Instructed to go to Urgent Care today for evaluation and to get medications refilled, Instructed that he can get primary care at the Urgent Care that he plans to go to and to try to establish a new provider there, Reinforced CHO counting and portion control Instructed that his hemoglobin A1C puts him at risk for complications and that medication is needed to lower his blood sugars,  Reinforced importance of regular exercise for glycemic control, Reassigned EMMI programs, Reinforced on importance of keeping blood sugars in control and how elevated blood sugars cause long term complications, Reinforced to check blood sugars twice a day fasting or post prandial    THN CM Care Plan Problem Two        Most Recent Value   Care Plan Problem Two  Elevated blood pressure related to dx of HTN   Role Documenting the Problem Two  Care Management Coordinator   Care  Plan for Problem Two  Active   Interventions for Problem Two Long Term Goal   Instructed on importance of taking B/P medications to keep B/P under control, Discussed risk for stroke and kidney damage with elevated B/P, Instructed to follow  a low sodium diet , Instructed to take B/P daily, record on log and bring to next visit   THN Long Term Goal (31-90) days  Member will maintain B/P to below 140/90 for 80% of readings for the next 90 days   THN Long Term Goal Start Date  10/12/15   [facility] CM  Short Term Goal #1 (0-30 days)  Member will go to Urgent care today and restart antihypertensive medications   THN CM Short Term Goal #1 Start Date  10/12/15   Interventions for Short Term Goal #2   Directed member to go to Urgent Care today for evaluation of elevated B/P and to get medications refilled    Peter Garter RN, [facility] Care Management Coordinator-Link to [place] Management [phone]

## 2015-10-12 NOTE — Patient Instructions (Signed)
1. Plan to go to Urgent Care after today's visit.  Plan to get medications refilled 2.  Plan to eat 60-75gm (4-5 servings) of carbohydrates a meal and 15gm for snacks.  Try to eat more protein with meals and snacks. Do no skip meals 3. Plan to check blood sugar 2 times a day fasting (morning) or 1 - 2 hours after eating.  Goals of 80-130 before meals and less than 180 after meals. 4. Plan to exercise twice a week walking or running for 30 minutes and continue circuit training once week.   5. Plan to check blood pressure daily at different times.  Record on log and bring to next visit 6. Plan to reschedule  eye appointment 7. Plan to complete EMMI programs by 11/20/15 8. Plan to get refill of medication today 9. Plan to return to Link to Wellness on 11/14/15 at 10:30AM

## 2015-10-12 NOTE — Patient Instructions (Signed)
     IF you received an x-ray today, you will receive an invoice from [facility]. Please contact  Radiology at [phone] with questions or concerns regarding your invoice.   IF you received labwork today, you will receive an invoice from [facility]. Please contact [facility] at [phone] with questions or concerns regarding your invoice.   Our billing staff will not be able to assist you with questions regarding bills from these companies.  You will be contacted with the lab results as soon as they are available. The fastest way to get your results is to activate your My Chart account. Instructions are located on the last page of this paperwork. If you have not heard from us regarding the results in 2 weeks, please contact this office.      

## 2015-11-14 ENCOUNTER — Other Ambulatory Visit: Payer: Self-pay

## 2015-11-14 VITALS — BP 118/82 | HR 88 | Resp 16 | Ht 71.0 in | Wt 237.4 lb

## 2015-11-14 DIAGNOSIS — E11319 Type 2 diabetes mellitus with unspecified diabetic retinopathy without macular edema: Secondary | ICD-10-CM | POA: Diagnosis not present

## 2015-11-14 DIAGNOSIS — H179 Unspecified corneal scar and opacity: Secondary | ICD-10-CM | POA: Diagnosis not present

## 2015-11-14 DIAGNOSIS — H52223 Regular astigmatism, bilateral: Secondary | ICD-10-CM | POA: Diagnosis not present

## 2015-11-14 DIAGNOSIS — H5213 Myopia, bilateral: Secondary | ICD-10-CM | POA: Diagnosis not present

## 2015-11-14 DIAGNOSIS — E119 Type 2 diabetes mellitus without complications: Secondary | ICD-10-CM

## 2015-11-14 DIAGNOSIS — H524 Presbyopia: Secondary | ICD-10-CM | POA: Diagnosis not present

## 2015-11-14 LAB — HM DIABETES EYE EXAM

## 2015-11-14 NOTE — Patient Instructions (Signed)
1. Plan to eat 60-75gm (4-5 servings) of carbohydrates a meal and 15gm for snacks.  Try to eat more protein with meals and snacks. Do no skip meals 2. Plan to check blood sugar 2 times a day fasting (morning) or 1 - 2 hours after eating.  Goals of 80-130 before meals and less than 180 after meals. 3. Plan to exercise twice a week walking or go to pool for 30 minutes and continue circuit training once week.  Plan to do weight training twice a week 4. Plan to check blood pressure twice a week at different times.  Record on log and bring to next visit 5. Plan to keep  eye appointment today 6. Plan to go to see primary care MD about right foot  7. Plan to return to Link to Wellness on 12/14/15 at 11AM

## 2015-11-14 NOTE — Patient Outreach (Signed)
[facility] ([facility]) Care Management   11/14/2015  Grant L Arutyunyan 09/05/1965 [id number]  Sean Schaefer is an 50 y.o. male.   Member seen for follow up office visit for Link to Wellness program for self management of Type 2 diabetes  Subjective: Member states that he has been taking his medications as ordered since his last visit in which he went to Urgent Care afterwards to get medications refilled.  States he has been watching his diet better and he is trying to eat regular meals instead of skipping meals.  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  States he is having pain and swelling in his rt great toe as he thinks he hit his foot at church last week.  States he has not had his foot looked at by a doctor.  States he is still thinking about getting a new primary provider but he has not done anything about scheduling yet.  States he is scheduled to have his eye exam later today.  Objective:   Review of Systems  Musculoskeletal: Positive for joint pain.  All other systems reviewed and are negative. Reviewed glucometer 7 day average-186 14 day average-187 30 day average-199  Physical Exam  Musculoskeletal:       Feet:   Today's Vitals   11/14/15 1035 11/14/15 1038  BP: 118/82   Pulse: 88   Resp: 16   Height: 1.803 m (5' 11")   Weight: 237 lb 6.4 oz (107.684 kg)   SpO2: 98%   PainSc: 4  4     Encounter Medications:   Outpatient Encounter Prescriptions as of 11/14/2015  Medication Sig  . amLODipine (NORVASC) 10 MG tablet Take 1 tablet (10 mg total) by mouth daily. Reported on 10/12/2015  . glipiZIDE (GLUCOTROL XL) 10 MG 24 hr tablet Take 1 tablet (10 mg total) by mouth daily with breakfast. Reported on 10/12/2015  . ibuprofen (ADVIL,MOTRIN) 200 MG tablet Take 200 mg by mouth every 6 (six) hours as needed for pain. Reported on 10/12/2015  . ramipril (ALTACE) 10 MG capsule Take 1 capsule (10 mg total) by mouth daily. Reported on  10/12/2015  . Adalimumab 40 MG/0.8ML PSKT Inject 1 each into the skin. Reported on 11/14/2015  . isoniazid (NYDRAZID) 300 MG tablet Take 3 tablets (900 mg total) by mouth once a week. (Patient not taking: Reported on 10/12/2015)  . lidocaine (XYLOCAINE) 5 % ointment Apply topically 3 (three) times daily as needed. (Patient not taking: Reported on 10/12/2015)  . metroNIDAZOLE (FLAGYL) 500 MG tablet Take 1 tablet (500 mg total) by mouth 3 (three) times daily. (Patient not taking: Reported on 10/12/2015)  . ondansetron (ZOFRAN) 8 MG tablet Take 1 tablet (8 mg total) by mouth every 8 (eight) hours as needed for nausea or vomiting. (Patient not taking: Reported on 10/12/2015)  . pyridOXINE (VITAMIN B-6) 50 MG tablet Take 1 tablet (50 mg total) by mouth daily. (Patient not taking: Reported on 10/12/2015)  . Rifapentine 150 MG TABS Take 6 tablets (900 mg total) by mouth once a week. (Patient not taking: Reported on 10/12/2015)   No facility-administered encounter medications on file as of 11/14/2015.    Functional Status:   In your present state of health, do you have any difficulty performing the following activities: 10/12/2015 02/13/2015  Hearing? N N  Vision? N N  Difficulty concentrating or making decisions? N N  Walking or climbing stairs? N N  Dressing or   bathing? N N  Doing errands, shopping? N N    Fall/Depression Screening:    PHQ 2/9 Scores 11/14/2015 10/12/2015 10/12/2015 05/01/2015 02/13/2015 12/26/2014 02/15/2014  PHQ - 2 Score 0 0 0 0 0 0 0    Assessment:  Member seen for follow up office visit for Link to Wellness program for self management of Type 2 diabetes. Member not meeting diabetes self management goal with hemoglobin A1C of 13.7%.  Member is now back on his medications. Reports better adherence with low CHO, low sodium diet.  CBGs range 157-213 fasting and 168-233 post prandial. Member has not seen a primary provider but did see provider at [facility] care.  Member to have  eye exam  later today. Member reports walking daily.  Rt great toe with redness and swelling and urged member to have foot evaluated as soon as possible by MD.  Plan:   Plan to eat 60-75gm (4-5 servings) of carbohydrates a meal and 15gm for snacks.  Try to eat more protein with meals and snacks. Do no skip meals Plan to check blood sugar 2 times a day fasting (morning) or 1 - 2 hours after eating.  Goals of 80-130 before meals and less than 180 after meals. Plan to exercise twice a week walking or go to pool for 30 minutes and continue circuit training once week.  Plan to do weight training twice a week Plan to check blood pressure twice a week at different times.  Record on log and bring to next visit Plan to keep  eye appointment today Plan to go to see primary care MD about right foot Plan to return to Link to Wellness on 12/14/15 at [place] Problem One        Most Recent Value   Care Plan Problem One  Elevated blood sugars related to dx of Type 2 DM   Role Documenting the Problem One  Care Management Coordinator   Care Plan for Problem One  Active   [facility] Long Term Goal (31-90 days)  Member will decrease hemoglobin A1C to 7 or below within the next 90 days   [facility] Long Term Goal Start Date  10/12/15 [Elevated Hemoglobin A1C 13.7 goal continued]   Interventions for Problem One Long Term Goal  Prasied for improved blood sugars and adherence with medications, Reinforced CHO counting and portion control, Instructed on the progressive nature of diabetes and that he may need more medications to get his blood sugars at goal,  Reinforced importance of regular exercise for glycemic control, Instructed to keep eye appointment today, encouraged to see primary care provider for a physical,  Reinforced to check blood sugars twice a day fasting or post prandial    [facility] CM Care Plan Problem Two        Most Recent Value   Care Plan Problem Two  Elevated blood pressure related to dx of HTN   Role  Documenting the Problem Two  Care Management Coordinator   Care Plan for Problem Two  Active   Interventions for Problem Two Long Term Goal   Reinforced on importance of taking B/P medications to keep B/P under control, Praised for improved B/P,  Reinforced to follow  a low sodium diet , Instructed to take B/P twice a week and to  record on log and bring to next visit   [facility] Long Term Goal (31-90) days  Member will maintain B/P to below 140/90 for 80% of readings for the next 90  days   [facility] Long Term Goal Start Date  10/12/15 [continued B/P readings improved 118/82 ]   [facility] CM Short Term Goal #1 (0-30 days)  Member will go to Urgent care today and restart antihypertensive medications   [facility] CM Short Term Goal #1 Start Date  10/12/15   [facility] CM Short Term Goal #1 Met Date   11/14/15 [went to Urgent care on B/P medications now]    [facility] CM Care Plan Problem Three        Most Recent Value   Care Plan Problem Three  Member with injuried rt great toe   Role Documenting the Problem Three  Care Management Coordinator   Care Plan for Problem Three  Active   [facility] CM Short Term Goal #1 (0-30 days)  Member will see provider about rt great toe injury  within the next 1-3 days   [facility] CM Short Term Goal #1 Start Date  11/14/15   Interventions for Short Term Goal #1  Instructed member that he needs to have his rt foot examined by his provider to see if he has a fracture or someother disease process, Instructed to keep foot elevated and to apply ice to help with the swelling    Melissa Sandlin RN, BSN,CCM Care Management Coordinator-Link to Wellness [facility] Care Management [phone] 

## 2015-12-14 ENCOUNTER — Other Ambulatory Visit: Payer: Self-pay

## 2015-12-14 VITALS — BP 120/84 | HR 82 | Resp 16 | Ht 71.0 in | Wt 234.0 lb

## 2015-12-14 DIAGNOSIS — E119 Type 2 diabetes mellitus without complications: Secondary | ICD-10-CM

## 2015-12-14 NOTE — Patient Instructions (Signed)
1. Plan to eat 60-75gm (4-5 servings) of carbohydrates a meal and 15gm for snacks.  Try to eat more protein with meals and snacks. Do not skip meals 2. Plan to check blood sugar 2 times a day fasting (morning) or 1 - 2 hours after eating.  Goals of 80-130 before meals and less than 180 after meals. 3. Plan to exercise four times a week at the gym and go to pool for 30 minutes once week.  Plan to do weight training twice a week 4. Plan [facility] [phone] 5. Plan to go to see primary care MD next month 6. Plan to complete EMMI programs by 01/20/16 7. Plan to return to Link to Wellness on 02/15/16 at 11AM

## 2015-12-14 NOTE — Patient Outreach (Signed)
[facility]) Care Management   12/14/2015  [name] [birth year] [id number]  Sean Schaefer is an 50 y.o. male.   Member seen for follow up office visit for Link to Wellness program for self management of Type 2 diabetes  Subjective: member states that he has been taking his medications, checking his blood sugars and walking daily.  States that he has joined [employer] and he is working with Tour manager.  States his blood sugars a improved and his blood pressures are back to normal.  States he is trying to watch his diet better.  States that he had his eye exam on 11/14/15 and he got new contacts.    Objective:   Review of Systems  All other systems reviewed and are negative. Reviewed glucometer 7 day average-182 14 day average-180 30 day average-185  Physical Exam Today's Vitals   12/14/15 1052  BP: 120/84  Pulse: 82  Resp: 16  Height: 1.803 m (5' 11" )  Weight: 234 lb (106.142 kg)  SpO2: 99%  PainSc: 0-No pain   Encounter Medications:   Outpatient Encounter Prescriptions as of 12/14/2015  Medication Sig  . amLODipine (NORVASC) 10 MG tablet Take 1 tablet (10 mg total) by mouth daily. Reported on 10/12/2015  . glipiZIDE (GLUCOTROL XL) 10 MG 24 hr tablet Take 1 tablet (10 mg total) by mouth daily with breakfast. Reported on 10/12/2015  . ibuprofen (ADVIL,MOTRIN) 200 MG tablet Take 200 mg by mouth every 6 (six) hours as needed for pain. Reported on 10/12/2015  . ramipril (ALTACE) 10 MG capsule Take 1 capsule (10 mg total) by mouth daily. Reported on 10/12/2015  . Adalimumab 40 MG/0.8ML PSKT Inject 1 each into the skin. Reported on 12/14/2015  . isoniazid (NYDRAZID) 300 MG tablet Take 3 tablets (900 mg total) by mouth once a week. (Patient not taking: Reported on 10/12/2015)  . lidocaine (XYLOCAINE) 5 % ointment Apply topically 3 (three) times daily as needed. (Patient not taking: Reported on 10/12/2015)  . metroNIDAZOLE (FLAGYL) 500 MG tablet Take 1 tablet (500 mg  total) by mouth 3 (three) times daily. (Patient not taking: Reported on 10/12/2015)  . ondansetron (ZOFRAN) 8 MG tablet Take 1 tablet (8 mg total) by mouth every 8 (eight) hours as needed for nausea or vomiting. (Patient not taking: Reported on 10/12/2015)  . pyridOXINE (VITAMIN B-6) 50 MG tablet Take 1 tablet (50 mg total) by mouth daily. (Patient not taking: Reported on 10/12/2015)  . Rifapentine 150 MG TABS Take 6 tablets (900 mg total) by mouth once a week. (Patient not taking: Reported on 10/12/2015)   No facility-administered encounter medications on file as of 12/14/2015.    Functional Status:   In your present state of health, do you have any difficulty performing the following activities: 10/12/2015 02/13/2015  Hearing? N N  Vision? N N  Difficulty concentrating or making decisions? N N  Walking or climbing stairs? N N  Dressing or bathing? N N  Doing errands, shopping? N N    Fall/Depression Screening:    PHQ 2/9 Scores 12/14/2015 11/14/2015 10/12/2015 10/12/2015 05/01/2015 02/13/2015 12/26/2014  PHQ - 2 Score 0 0 0 0 0 0 0    Assessment:  Member seen for follow up office visit for Link to Wellness program for self management of Type 2 diabetes. Member not meeting diabetes self management goal with hemoglobin A1C of 13.7%. Not due to have rechecked. Member reports taking his medications. Reports better adherence with low CHO, low sodium  diet. CBGs range 150-203 fasting and 132-209 post prandial. Member has not seen a primary provider but did see provider at [facility] Urgent care. Member reports joining gym and working with a Clinical research associate. Member reports walking daily. Member up to date with eye exam.  Injured rt great toe healed.  Plan:  Plan to eat 60-75gm (4-5 servings) of carbohydrates a meal and 15gm for snacks.  Try to eat more protein with meals and snacks. Do not skip meals Plan to check blood sugar 2 times a day fasting (morning) or 1 - 2 hours after eating.  Goals of 80-130 before  meals and less than 180 after meals. Plan to exercise four times a week at the gym and go to pool for 30 minutes once week.  Plan to do weight training twice a week Plan to [facility] at [facility] Plan to go to see primary care MD next month Plan to complete EMMI programs by 01/20/16 Plan to return to Link to Wellness on 02/15/16 at [place] Problem One        Most Recent Value   Care Plan Problem One  Elevated blood sugars related to dx of Type 2 DM   Role Documenting the Problem One  Care Management Coordinator   Care Plan for Problem One  Active   [facility] Long Term Goal (31-90 days)  Member will decrease hemoglobin A1C to 7 or below within the next 90 days   [facility] Long Term Goal Start Date  10/12/15 [Continued not due for recheck of hemoglobin A1C,CBG improved]   Interventions for Problem One Long Term Goal  Again praised for improved blood sugars and adherence with medications, Reinforced CHO counting and portion control, Reinforced on the progressive nature of diabetes and that he may need more medications to get his blood sugars at goal,  Reinforced importance of regular exercise for glycemic control, Instructed to keep eye appointment today, encouraged to see primary care provider for a physical,  Reinforced to check blood sugars twice a day fasting or post prandial    [facility] CM Care Plan Problem Two        Most Recent Value   Care Plan Problem Two  Elevated blood pressure related to dx of HTN   Role Documenting the Problem Two  Care Management Coordinator   Care Plan for Problem Two  Active   Interventions for Problem Two Long Term Goal   Reinforced on importance of taking B/P medications to keep B/P under control, Reinforced to follow  a low sodium diet    [facility] Long Term Goal (31-90) days  Member will maintain B/P to below 140/90 for 80% of readings for the next 90 days   [facility] Long Term Goal Start Date  12/14/15 [Continue B/P readins below 140/90]     [facility] CM Care Plan Problem Three        Most Recent Value   Care Plan Problem Three  Member with injuried rt great toe   Role Documenting the Problem Three  Care Management Coordinator   Care Plan for Problem Three  Not Active   [facility] CM Short Term Goal #1 (0-30 days)  Member will see provider about rt great toe injury  within the next 1-3 days   [facility] CM Short Term Goal #1 Start Date  11/14/15   [facility] CM Short Term Goal #1 Met Date  12/14/15 [Toe healed]     Peter Garter RN, [facility] Care  Management Coordinator-Link to [place] Management [phone]

## 2015-12-26 MED FILL — TRUE METRIX GLUCOSE TEST ST: 90 days supply | Qty: 100 | Fill #1

## 2016-01-08 ENCOUNTER — Ambulatory Visit (INDEPENDENT_AMBULATORY_CARE_PROVIDER_SITE_OTHER): Payer: 59 | Admitting: Family Medicine

## 2016-01-08 VITALS — BP 118/78 | HR 84 | Temp 98.3°F | Resp 16 | Ht 69.0 in | Wt 238.2 lb

## 2016-01-08 DIAGNOSIS — IMO0001 Reserved for inherently not codable concepts without codable children: Secondary | ICD-10-CM

## 2016-01-08 DIAGNOSIS — I10 Essential (primary) hypertension: Secondary | ICD-10-CM

## 2016-01-08 DIAGNOSIS — E1165 Type 2 diabetes mellitus with hyperglycemia: Secondary | ICD-10-CM

## 2016-01-08 LAB — COMPLETE METABOLIC PANEL WITH GFR
ALT: 15 U/L (ref 9–46)
AST: 15 U/L (ref 10–40)
Albumin: 3.9 g/dL (ref 3.6–5.1)
Alkaline Phosphatase: 51 U/L (ref 40–115)
BUN: 17 mg/dL (ref 7–25)
CO2: 24 mmol/L (ref 20–31)
Calcium: 9.1 mg/dL (ref 8.6–10.3)
Chloride: 102 mmol/L (ref 98–110)
Creat: 1.25 mg/dL (ref 0.60–1.35)
GFR, Est African American: 79 mL/min (ref >=60)
GFR, Est Non African American: 68 mL/min (ref >=60)
Glucose, Bld: 133 mg/dL — ABNORMAL HIGH (ref 65–99)
Potassium: 4.5 mmol/L (ref 3.5–5.3)
Sodium: 136 mmol/L (ref 135–146)
Total Bilirubin: 0.5 mg/dL (ref 0.2–1.2)
Total Protein: 7.6 g/dL (ref 6.1–8.1)

## 2016-01-08 LAB — POCT GLYCOSYLATED HEMOGLOBIN (HGB A1C): Hemoglobin A1C: 8.3

## 2016-01-08 NOTE — Progress Notes (Signed)
50 yo hypertensive diabetic man who is here for 3 month check.  Onset of diabetes was a couple years ago.  Now on Humira for inflammatory bowel disease.  Notes some post prandial normal values but elevated in the morning around 180 or so.  He works for [employer] and he has some nursing supervision for nutrition, diet.  He is working on his exercise--> started gym work last week.  Objective: BP 118/78 mmHg  Pulse 84  Temp(Src) 98.3 F (36.8 C) (Oral)  Resp 16  Ht 5' 9"  (1.753 m)  Wt 238 lb 3.2 oz (108.047 kg)  BMI 35.16 kg/m2  SpO2 98% Wt Readings from Last 3 Encounters:  01/08/16 238 lb 3.2 oz (108.047 kg)  12/14/15 234 lb (106.142 kg)  11/14/15 237 lb 6.4 oz (107.684 kg)  HEENT: unremarkable Chest: clear Heart: reg, no murmur Results for orders placed or performed in visit on 01/08/16  POCT glycosylated hemoglobin (Hb A1C)  Result Value Ref Range   Hemoglobin A1C 8.3    Hemoglobin A1C 13.7      Specimen Collected: 10/12/15        Assessment: Improved glycemic control. She has some progress to make but I'm pleased that he is getting into exercise and see nutritionist.  Plan: Recheck in 3 months Continue current medications Work on diet and exercise  Signed: Carola Frost.D.

## 2016-01-08 NOTE — Patient Instructions (Addendum)
We are seeing significant improvement in the A1c. It's 8.3 today and was 13.7 last visit. Starting exercises wonderful and see nutritionist is very important. I like to see if he can get 10 pounds off in the next 3 months.     please return at the end of September.  IF you received an x-ray today, you will receive an invoice from [facility]. Please contact [facility] at [phone] with questions or concerns regarding your invoice.   IF you received labwork today, you will receive an invoice from [employer]. Please contact [facility] at [phone] with questions or concerns regarding your invoice.   Our billing staff will not be able to assist you with questions regarding bills from these companies.  You will be contacted with the lab results as soon as they are available. The fastest way to get your results is to activate your My Chart account. Instructions are located on the last page of this paperwork. If you have not heard from [place] regarding the results in 2 weeks, please contact this office.

## 2016-01-10 ENCOUNTER — Encounter: Payer: Self-pay | Admitting: *Deleted

## 2016-01-10 MED FILL — glipiZIDE XL 10 MG TB24: 10 | 90 days supply | Qty: 90 | Fill #1

## 2016-01-10 MED FILL — AMLODIPINE BESYLATE 10 MG T: 10 | 90 days supply | Qty: 90 | Fill #1

## 2016-01-10 MED FILL — RAMIPRIL 10 MG CAPSULE: 10 | 90 days supply | Qty: 90 | Fill #1

## 2016-01-12 ENCOUNTER — Encounter: Payer: 59 | Attending: Family Medicine | Admitting: *Deleted

## 2016-01-12 ENCOUNTER — Encounter: Payer: Self-pay | Admitting: *Deleted

## 2016-01-12 VITALS — Ht 70.0 in | Wt 236.8 lb

## 2016-01-12 DIAGNOSIS — Z713 Dietary counseling and surveillance: Secondary | ICD-10-CM | POA: Insufficient documentation

## 2016-01-12 DIAGNOSIS — IMO0002 Reserved for concepts with insufficient information to code with codable children: Secondary | ICD-10-CM

## 2016-01-12 DIAGNOSIS — E1165 Type 2 diabetes mellitus with hyperglycemia: Secondary | ICD-10-CM

## 2016-01-12 DIAGNOSIS — E118 Type 2 diabetes mellitus with unspecified complications: Secondary | ICD-10-CM

## 2016-01-12 NOTE — Patient Instructions (Signed)
Plan:  Aim for 4 Carb Choices per meal (60 grams) +/- 1 either way  Aim for 0-2 Carbs per snack if hungry  Include protein in moderation with your meals and snacks Consider reading food labels for Total Carbohydrate of foods Continue with your activity level daily as tolerated Continue checking BG at alternate times per day Consider checking your BG mid-sleep time and if lower than bedtime or AM BG,(for example: below 120 mg/dl) consider eating a snack to prevent the drop during the night.  Continue taking medication as directed by MD

## 2016-01-12 NOTE — Progress Notes (Signed)
Diabetes Self-Management Education  Visit Type:  Follow-up  Appt. Start Time: 1030 Appt. End Time: 1200  01/12/2016  [name], identified by name and date of birth, is a 50 y.o. male with a diagnosis of Diabetes:  He states his FBG ranges are 180-220 mg/dl and post supper lower at 140-165 mg/dl. He is very active volunteering for his church and other groups including [employer]. He does the cooking for his family including his wife and 2 children ages 36 and 69. He has been going to the gym working out 3 days a week and would like to get into the pool for more cardiac exercise which he has done in the past. Patient reports that during a period of transition, he did not take his diabetes medications and his A1c increased to 14% but is back down to 8% now.  ASSESSMENT  Height 5' 10"  (1.778 m), weight 236 lb 12.8 oz (107.412 kg). Body mass index is 33.98 kg/(m^2).       Diabetes Self-Management Education - 01/12/16 0850    Psychosocial Assessment   Patient Belief/Attitude about Diabetes (p) Motivated to manage diabetes   Self-care barriers (p) None   Self-management support (p) Church;CDE visits;Family   Patient Concerns (p) Nutrition/Meal planning;Weight Control;Glycemic Control   Dietary Intake   Breakfast (p) 7 AM: 2 eggs    Snack (morning) (p) no   Lunch (p) 2 PM: [place] food with vegetables and small serving rice, soup with Ginger and sweet roll OR occasioanlly fried chicken, green beans OR [place] and cheese roll up   Snack (afternoon) (p) no   Dinner (p) lean meat, vegetables, occasionally brown rice or sweet potato with toppings   Snack (evening) (p) no   Beverage(s) (p) water, occasionally 1/2 and 1/2 sweet tea (on Sunday)   Exercise   Exercise Type (p) Moderate (swimming / aerobic walking)  gym with cardio, weights, then 20 min cardio again   How many days per week to you exercise? (p) 4   How many minutes per day do you exercise? (p) 45   Total minutes per week  of exercise (p) 180   Patient Education   Previous Diabetes Education (p) Yes (please comment)  Core Class 2 years ago and LTW    Subsequent Visit   Since your last visit have you continued or begun to take your medications as prescribed? (p) Yes   Since your last visit have you experienced any weight changes? (p) Loss      Learning Objective:  Patient will have a greater understanding of diabetes self-management. Patient education plan is to attend individual and/or group sessions per assessed needs and concerns.   Plan:   Patient Instructions  Plan:  Aim for 4 Carb Choices per meal (60 grams) +/- 1 either way  Aim for 0-2 Carbs per snack if hungry  Include protein in moderation with your meals and snacks Consider reading food labels for Total Carbohydrate of foods Continue with your activity level daily as tolerated Continue checking BG at alternate times per day Consider checking your BG mid-sleep time and if lower than bedtime or AM BG,(for example: below 120 mg/dl) consider eating a snack to prevent the drop during the night.  Continue taking medication as directed by MD       Expected Outcomes:     Education material provided: Living Well with Diabetes, Meal plan card, Snack sheet and Carbohydrate counting sheet  If problems or questions, patient to contact team  via:  Phone and Email  Future DSME appointment: -  PRN

## 2016-02-05 MED FILL — CLINDAMYCIN HCL 300 MG CAPS: 300 | 7 days supply | Qty: 28 | Fill #0

## 2016-02-05 MED FILL — HYDROCOD-IBU 7.5-200 TAB: 7.5-200 | 4 days supply | Qty: 28 | Fill #0

## 2016-02-15 ENCOUNTER — Ambulatory Visit: Payer: 59

## 2016-03-28 ENCOUNTER — Other Ambulatory Visit: Payer: Self-pay

## 2016-03-28 DIAGNOSIS — E119 Type 2 diabetes mellitus without complications: Secondary | ICD-10-CM

## 2016-03-28 NOTE — Patient Outreach (Signed)
[facility]) Care Management   03/28/2016  Sean Schaefer [birth year] [id number]  Sean Schaefer is an 50 y.o. male.   Member seen for follow up office visit for Link to Wellness program for self management of Type 2 diabetes  Subjective: Member states that his hemoglobin A1C had gone down to 8.3% in June when he saw the MD.  States he is due to go back this month.  States he has been going to the gym 3-4 times a week and he is active at his church with yard work and youth activities.  States that he did feel that his sugar was low one day when he was working at [employer] but he did not have his meter with him to check.  STates that he felt better after drinking something sweet.  Objective:   ROS  Physical Exam Today's Vitals   03/28/16 1038 03/28/16 1042  BP: 120/72   Pulse: 96   Resp: 14   SpO2: 98%   Weight: 232 lb (105.2 kg)   Height: 1.803 m (5' 11")   PainSc: 0-No pain 0-No pain   Encounter Medications:   Outpatient Encounter Prescriptions as of 03/28/2016  Medication Sig  . amLODipine (NORVASC) 10 MG tablet Take 1 tablet (10 mg total) by mouth daily. Reported on 10/12/2015  . glipiZIDE (GLUCOTROL XL) 10 MG 24 hr tablet Take 1 tablet (10 mg total) by mouth daily with breakfast. Reported on 10/12/2015  . ramipril (ALTACE) 10 MG capsule Take 1 capsule (10 mg total) by mouth daily. Reported on 10/12/2015   No facility-administered encounter medications on file as of 03/28/2016.     Functional Status:   In your present state of health, do you have any difficulty performing the following activities: 03/28/2016 10/12/2015  Hearing? N N  Vision? N N  Difficulty concentrating or making decisions? N N  Walking or climbing stairs? N N  Dressing or bathing? N N  Doing errands, shopping? N N  Some recent data might be hidden    Fall/Depression Screening:    PHQ 2/9 Scores 03/28/2016 01/12/2016 01/08/2016 12/14/2015 11/14/2015 10/12/2015 10/12/2015  PHQ - 2 Score 0 0 0 0 0 0 0     Assessment:  Member seen for follow up office visit for Link to Wellness program for self management of Type 2 diabetes. Member not meeting diabetes self management goal with hemoglobin A1C of 8.3% which is improved from 13.7%.  Member reports taking his medications. Reports better adherence with low CHO, low sodium diet.  Member is due to see his primary care  provider Member continues to go to the gym and working with a trainer. Member up to date with eye exam.   Plan:  Plan to eat 60-75gm (4-5 servings) of carbohydrates a meal and 15gm for snacks.  Try to eat more protein with meals and snacks. Do not skip meals Plan to check blood sugar 2 times a day fasting (morning) or 1 - 2 hours after eating.  Goals of 80-130 before meals and less than 180 after meals. Plan to exercise four times a week at the gym and push mow yard at church once a week.   Plan get glucometer strips refilled today Plan to go to see primary care MD this month Plan to complete EMMI programs by 05/22/16 Plan to return to Link to Wellness on 07/01/16 at 10:30AM  [facility] CM Care Plan Problem One   Flowsheet Row Most Recent Value  Care Plan  Problem One  Elevated blood sugars related to dx of Type 2 DM  Role Documenting the Problem One  Care Management [place] for Problem One  Active  [facility] Long Term Goal (31-90 days)  Member will decrease hemoglobin A1C to 7 or below within the next 90 days  [facility] Long Term Goal Start Date  03/28/16 [Continued hemoglobin A1C improved to 8.3%]  Interventions for Problem One Long Term Goal  Praised for improved hemoglobin A1C reading and adherence with medications, Reinforced CHO counting and portion control, Reinforced importance of regular exercise for glycemic control,  Encouraged to see primary care provider, Instructed on s/s of hypoglycemia and how to treat,  Reinforced to check blood sugars twice a day fasting or post prandial    [facility] CM Care Plan Problem Two   Flowsheet  Row Most Recent Value  Care Plan Problem Two  Elevated blood pressure related to dx of HTN  Role Documenting the Problem Two  Care Management Coordinator  Care Plan for Problem Two  Not Active  Interventions for Problem Two Long Term Goal   Reinforced on importance of taking B/P medications to keep B/P under control, Reinforced to follow  a low sodium diet   [facility]N Long Term Goal (31-90) days  Member will maintain B/P to below 140/90 for 80% of readings for the next 90 days  [facility] Long Term Goal Start Date  12/14/15  [facility] Long Term Goal Met Date  03/28/16    Peter Garter RN, [facility] Care Management Coordinator-Link to [place] Management [phone]

## 2016-03-28 NOTE — Patient Instructions (Signed)
1. Plan to eat 60-75gm (4-5 servings) of carbohydrates a meal and 15gm for snacks.  Try to eat more protein with meals and snacks. Do not skip meals 2. Plan to check blood sugar 2 times a day fasting (morning) or 1 - 2 hours after eating.  Goals of 80-130 before meals and less than 180 after meals. 3. Plan to exercise four times a week at the gym and push mow yard at church once a week.   4. Plan get glucometer strips refilled today 5. Plan to go to see primary care MD this month 6. Plan to complete EMMI programs by 05/22/16 7. Plan to return to Link to Wellness on 07/01/16 at 10:30AM

## 2016-03-29 MED FILL — TRUE METRIX GLUCOSE TEST ST: 90 days supply | Qty: 100 | Fill #2

## 2016-04-06 MED FILL — glipiZIDE XL 10 MG TB24: 10 | 90 days supply | Qty: 90 | Fill #2

## 2016-04-08 MED FILL — RAMIPRIL 10 MG CAPSULE: 10 | 90 days supply | Qty: 90 | Fill #2

## 2016-04-08 MED FILL — AMLODIPINE BESYLATE 10 MG T: 10 | 90 days supply | Qty: 90 | Fill #2

## 2016-06-19 MED FILL — glipiZIDE XL 10 MG TB24: 10 | 90 days supply | Qty: 90 | Fill #3

## 2016-07-01 ENCOUNTER — Ambulatory Visit: Payer: Self-pay

## 2016-07-10 MED FILL — AMLODIPINE BESYLATE 10 MG T: 10 | 90 days supply | Qty: 90 | Fill #3

## 2016-07-10 MED FILL — RAMIPRIL 10 MG CAPSULE: 10 | 90 days supply | Qty: 90 | Fill #3

## 2016-10-08 ENCOUNTER — Other Ambulatory Visit: Payer: Self-pay | Admitting: Family Medicine

## 2016-10-08 DIAGNOSIS — IMO0001 Reserved for inherently not codable concepts without codable children: Secondary | ICD-10-CM

## 2016-10-08 DIAGNOSIS — E1165 Type 2 diabetes mellitus with hyperglycemia: Principal | ICD-10-CM

## 2016-10-21 ENCOUNTER — Ambulatory Visit (INDEPENDENT_AMBULATORY_CARE_PROVIDER_SITE_OTHER): Payer: 59 | Admitting: Family Medicine

## 2016-10-21 VITALS — BP 121/81 | HR 103 | Temp 98.1°F | Resp 16 | Ht 69.0 in | Wt 228.0 lb

## 2016-10-21 DIAGNOSIS — E119 Type 2 diabetes mellitus without complications: Secondary | ICD-10-CM | POA: Diagnosis not present

## 2016-10-21 DIAGNOSIS — Z13220 Encounter for screening for lipoid disorders: Secondary | ICD-10-CM | POA: Diagnosis not present

## 2016-10-21 DIAGNOSIS — E1165 Type 2 diabetes mellitus with hyperglycemia: Secondary | ICD-10-CM

## 2016-10-21 DIAGNOSIS — I10 Essential (primary) hypertension: Secondary | ICD-10-CM

## 2016-10-21 DIAGNOSIS — M25562 Pain in left knee: Secondary | ICD-10-CM

## 2016-10-21 DIAGNOSIS — IMO0001 Reserved for inherently not codable concepts without codable children: Secondary | ICD-10-CM

## 2016-10-21 MED ORDER — AMLODIPINE BESYLATE 10 MG PO TABS: 10.0000 mg | ORAL_TABLET | Freq: Every day | ORAL | 1 refills | Status: DC

## 2016-10-21 MED ORDER — GLIPIZIDE ER 10 MG PO TB24: ORAL_TABLET | ORAL | 1 refills | Status: DC

## 2016-10-21 MED ORDER — RAMIPRIL 10 MG PO CAPS: 10.0000 mg | ORAL_CAPSULE | Freq: Every day | ORAL | 1 refills | Status: DC

## 2016-10-21 MED FILL — AMLODIPINE BESYLATE 10 MG T: 10 | 90 days supply | Qty: 90 | Fill #0

## 2016-10-21 MED FILL — glipiZIDE XL 10 MG TB24: 10 | 90 days supply | Qty: 90 | Fill #0

## 2016-10-21 MED FILL — RAMIPRIL 10 MG CAPSULE: 10 | 90 days supply | Qty: 90 | Fill #0

## 2016-10-21 NOTE — Progress Notes (Addendum)
By signing my name below, I, Mesha Guinyard, attest that this documentation has been prepared under the direction and in the presence of Merri Ray, MD.  Electronically Signed: Verlee Monte, Medical Scribe. 10/21/16. 9:20 AM.  Subjective:    Patient ID: Sean Schaefer, male    DOB: November 11, 1965, 51 y.o.   MRN: [id number]  HPI Chief Complaint  Patient presents with  . Diabetes  . Medication Management    requesting refill of Glipizide    HPI Comments: Sean Schaefer is a 51 y.o. male who presents to the Primary Care at [facility] for DM and HTN follow-up. He is a new pt to me. Pt is not fasting; ate frosted flakes at 7am this morning.  DM: Takes glipizide-xl 10 mg QD. Last visit with [place] was June 2017, A1c at that time was uncontrolled at 8.3 but improved from previous level of 13.7. He was advised to return Sept 2017, he has been seen by nutrition. Pt is compliant with glipizide, but has been out for the last 3 weeks. Pt has not been checking his blood sugar and has not been seen by a physician for his DM since his last visit. 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 Pt last saw his ophthalmologist within a year, and plans on making another appt. Reports having only 1 hypoglycemic episode last year after not eating earlier that day and cutting the grass on a hot day. Denies experiencing recent hypoglycemic episodes, negative side effects from his medication, diabetic complications in his eyes, kidneys, or nerves. Lab Results  Component Value Date   HGBA1C 8.3 01/08/2016   Wt Readings from Last 3 Encounters:  10/21/16 228 lb (103.4 kg)  03/28/16 232 lb (105.2 kg)  01/12/16 236 lb 12.8 oz (107.4 kg)   HTN: Takes amlodipine 10 mg QD, and ramipril 10 mg QD. Pt is complaint with his medication. Denies light-headedness, dizziness, chest pain, FHx of heart disease, or other acute sxs from his medication. Lab  Results  Component Value Date   CREATININE 1.25 01/08/2016   BP Readings from Last 3 Encounters:  10/21/16 121/81  03/28/16 120/72  01/08/16 118/78   IBD: As of last visit he was being treated with Humira for inflammatory bowel disease.  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 He mentions the pain is improving but it's still sore. Took tylenol the first 2 days, and discontinued treatment after that. He has been walking a mile on it without difficulty.  Immunizations: Pt deferred the flu shot.  Patient Active Problem List   Diagnosis Date Noted  . Diabetes ([place]) 12/26/2014  . 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 Crohn's disease ([place]) 12/18/2013   Past Medical History:  Diagnosis Date  . Diabetes mellitus without complication ([place])   . Hypertension   . Ulcerative colitis [facility])    Past Surgical History:  Procedure Laterality Date  . COLON SURGERY    . Perianal fistula repair  2012   No Known Allergies Prior to Admission medications   Medication Sig Start Date End Date Taking? Authorizing Provider  amLODipine (NORVASC) 10 MG tablet Take 1 tablet (10 mg total) by mouth daily. Reported on 10/12/2015 10/12/15  Yes Robyn Haber, MD  GLIPIZIDE XL 10 MG 24 hr tablet TAKE 1 TABLET BY MOUTH ONCE DAILY WITH BREAKFAST 10/09/16  Yes Legrand Como  L Erichsen, PA-C  ramipril (ALTACE) 10 MG capsule Take 1 capsule (10 mg total) by mouth daily. Reported on 10/12/2015 10/12/15  Yes Robyn Haber, MD   Social History   Social History  . Marital status: Married    Spouse name: N/A  . Number of children: N/A  . Years of education: N/A   Occupational History  . Not on file.   Social History Main Topics  . Smoking status: Never Smoker  . Smokeless tobacco: Never Used  . Alcohol use No  . Drug use: No  . Sexual activity: Yes   Other Topics Concern  . Not on file   Social History Narrative  . No narrative on file   Review of  Systems  Constitutional: Negative for fatigue and unexpected weight change.  Eyes: Negative for visual disturbance.  Respiratory: Negative for cough, chest tightness and shortness of breath.   Cardiovascular: Negative for chest pain, palpitations and leg swelling.  Gastrointestinal: Negative for abdominal pain and blood in stool.  Musculoskeletal: Positive for arthralgias (after injury). Negative for gait problem.  Neurological: Negative for dizziness, light-headedness and headaches.   Objective:  Physical Exam  Constitutional: He is oriented to person, place, and time. He appears well-developed and well-nourished.  HENT:  Head: Normocephalic and atraumatic.  Eyes: EOM are normal. Pupils are equal, round, and reactive to light.  Neck: No JVD present. Carotid bruit is not present.  Cardiovascular: Normal rate, regular rhythm and normal heart sounds.  Exam reveals no gallop and no friction rub.   No murmur heard. Pulmonary/Chest: Effort normal and breath sounds normal. No respiratory distress. He has no wheezes. He has no rales.  Musculoskeletal: He exhibits no edema.       Left knee: He exhibits no effusion and no bony tenderness.  Left knee: FROM No effusion No bony tenderness Negative varus Negative valgus Negative drawer Negative lachman Negative McMurry  Neurological: He is alert and oriented to person, place, and time.  Skin: Skin is warm and dry.  Psychiatric: He has a normal mood and affect.  Vitals reviewed.   Vitals:   10/21/16 0834  BP: 121/81  Pulse: (!) 103  Resp: 16  Temp: 98.1 F (36.7 C)  SpO2: 98%  Weight: 228 lb (103.4 kg)  Height: 5' 9"  (1.753 m)  Body mass index is 33.67 kg/m. Assessment & Plan:  .  Sean Schaefer is a 51 y.o. male Uncontrolled type 2 diabetes mellitus without complication, without long-term current use of insulin (HCC) - Plan: glipiZIDE (GLIPIZIDE XL) 10 MG 24 hr tablet, ramipril (ALTACE) 10 MG capsuleType 2 diabetes mellitus  without complication, without long-term current use of insulin (HCC) - Plan: Hemoglobin A1c, Comprehensive metabolic panel, Microalbumin, urine, CANCELED: Microalbumin, urine  -Check A1c, but unlikely to be controlled as off medications recently. We'll continue glipizide same dose for now, may need repeat testing in 3 months to assess true control. Unable to take metformin due to previous GI surgery. Typical testing intervals and health maintenance items for diabetes on his after visit summary for reference  -Pneumovax recommended but declined.  Essential hypertension - Plan: amLODipine (NORVASC) 10 MG tablet, Comprehensive metabolic panel  - Stable. Continue same dose of ramipril and Norvasc for now. Labs pending.  Screening for hyperlipidemia - Plan: Lipid panel   - Potential for statin was discussed with underlying diabetes and hypertension. Labs pending.  L knee pain  - Improving. Continue symptomatic care, RTC precautions for possible imaging if not continuing to  improve or symptomatic worsening.  Meds ordered this encounter  Medications  . amLODipine (NORVASC) 10 MG tablet    Sig: Take 1 tablet (10 mg total) by mouth daily. Reported on 10/12/2015    Dispense:  90 tablet    Refill:  1  . glipiZIDE (GLIPIZIDE XL) 10 MG 24 hr tablet    Sig: TAKE 1 TABLET BY MOUTH ONCE DAILY WITH BREAKFAST    Dispense:  90 tablet    Refill:  1  . ramipril (ALTACE) 10 MG capsule    Sig: Take 1 capsule (10 mg total) by mouth daily. Reported on 10/12/2015    Dispense:  90 capsule    Refill:  1   Patient Instructions   Please select new primary care provider, and recheck in next 3 months (possibly sooner depending on your hemoglobin A1c).  Will need to check cholesterol when fasting - Return at your convenience for fasting labs - 8 hours.   Pneumovax (pneumonia vaccine) is recommended.  Let me know if you change your mind on this.   Knee exam is overall reassuring. Tylenol if needed, but if pain not  continuing to improve next 2 weeks - return for recheck and possible xray.   Return to the clinic or go to the nearest emergency room if any of your symptoms worsen or new symptoms occur.   Diabetes Mellitus and Standards of Medical Care Managing diabetes (diabetes mellitus) can be complicated. Your diabetes treatment may be managed by a team of health care providers, including:  A diet and nutrition specialist (registered dietitian).  A nurse.  A certified diabetes educator (CDE).  A diabetes specialist (endocrinologist).  An eye doctor.  A primary care provider.  A dentist. Your health care providers follow a schedule in order to help you get the best quality of care. The following schedule is a general guideline for your diabetes management plan. Your health care providers may also give you more specific instructions. HbA1c ( hemoglobin A1c) test This test provides information about blood sugar (glucose) control over the previous 2-3 months. It is used to check whether your diabetes management plan needs to be adjusted.  If you are meeting your treatment goals, this test is done at least 2 times a year.  If you are not meeting treatment goals or if your treatment goals have changed, this test is done 4 times a year. Blood pressure test  This test is done at every routine medical visit. For most people, the goal is less than 130/80. Ask your health care provider what your goal blood pressure should be. Dental and eye exams  Visit your dentist two times a year.  If you have type 1 diabetes, get an eye exam 3-5 years after you are diagnosed, and then once a year after your first exam.  If you were diagnosed with type 1 diabetes as a child, get an eye exam when you are age 45 or older and have had diabetes for 3-5 years. After the first exam, you should get an eye exam once a year.  If you have type 2 diabetes, have an eye exam as soon as you are diagnosed, and then once a year  after your first exam. Foot care exam  Visual foot exams are done at every routine medical visit. The exams check for cuts, bruises, redness, blisters, sores, or other problems with the feet.  A complete foot exam is done by your health care provider once a year. This exam includes  an inspection of the structure and skin of your feet, and a check of the pulses and sensation in your feet.  Type 1 diabetes: Get your first exam 3-5 years after diagnosis.  Type 2 diabetes: Get your first exam as soon as you are diagnosed.  Check your feet every day for cuts, bruises, redness, blisters, or sores. If you have any of these or other problems that are not healing, contact your health care provider. Kidney function test ( urine microalbumin)  This test is done once a year.  Type 1 diabetes: Get your first test 5 years after diagnosis.  Type 2 diabetes: Get your first test as soon as you are diagnosed.  If you have chronic kidney disease (CKD), get a serum creatinine and estimated glomerular filtration rate (eGFR) test once a year. Lipid profile (cholesterol, HDL, LDL, triglycerides)  This test should be done when you are diagnosed with diabetes, and every 5 years after the first test. If you are on medicines to lower your cholesterol, you may need to get this test done every year.  The goal for LDL is less than 100 mg/dL (5.5 mmol/L). If you are at high risk, the goal is less than 70 mg/dL (3.9 mmol/L).    The goal for triglycerides is less than 150 mg/dL (8.3 mmol/L). Immunizations  The yearly flu (influenza) vaccine is recommended for everyone 6 months or older who has diabetes.  The pneumonia (pneumococcal) vaccine is recommended for everyone 2 years or older who has diabetes. If you are 87 or older, you may get the pneumonia vaccine as a series of two separate shots.  The hepatitis B vaccine is recommended for adults shortly after they have been diagnosed with diabetes.     Mental  and emotional health  Screening for symptoms of eating disorders, anxiety, and depression is recommended at the time of diagnosis and afterward as needed. If your screening shows that you have symptoms (you have a positive screening result), you may need further evaluation and be referred to a mental health care provider. Diabetes self-management education  Education about how to manage your diabetes is recommended at diagnosis and ongoing as needed. Treatment plan  Your treatment plan will be reviewed at every medical visit. Summary  Managing diabetes (diabetes mellitus) can be complicated. Your diabetes treatment may be managed by a team of health care providers.  Your health care providers follow a schedule in order to help you get the best quality of care.  Standards of care including having regular physical exams, blood tests, blood pressure monitoring, immunizations, screening tests, and education about how to manage your diabetes.  Your health care providers may also give you more specific instructions based on your individual health. This information is not intended to replace advice given to you by your health care provider. Make sure you discuss any questions you have with your health care provider. Document Released: 05/05/2009 Document Revised: 04/05/2016 Document Reviewed: 04/05/2016 Elsevier Interactive Patient Education  2017 Else[employer] Inc.   Knee Pain, Adult Knee pain in adults is common. It can be caused by many things, including:  Arthritis.  A fluid-filled sac (cyst) or growth in your knee.  An infection in your knee.  An injury that will not heal.  Damage, swelling, or irritation of the tissues that support your knee. Knee pain is usually not a sign of a serious problem. The pain may go away on its own with time and rest. If it does not, a health  care provider may order tests to find the cause of the pain. These may include:  Imaging tests, such as an X-ray,  MRI, or ultrasound.  Joint aspiration. In this test, fluid is removed from the knee.  Arthroscopy. In this test, a lighted tube is inserted into knee and an image is projected onto a TV screen.  A biopsy. In this test, a sample of tissue is removed from the body and studied under a microscope. Follow these instructions at home: Pay attention to any changes in your symptoms. Take these actions to relieve your pain. Activity   Rest your knee.  Do not do things that cause pain or make pain worse.  Avoid high-impact activities or exercises, such as running, jumping rope, or doing jumping jacks. General instructions   Take over-the-counter and prescription medicines only as told by your health care provider.  Raise (elevate) your knee above the level of your heart when you are sitting or lying down.  Sleep with a pillow under your knee.  If directed, apply ice to the knee:  Put ice in a plastic bag.  Place a towel between your skin and the bag.  Leave the ice on for 20 minutes, 2-3 times a day.  Ask your health care provider if you should wear an elastic knee support.  Lose weight if you are overweight. Extra weight can put pressure on your knee.  Do not use any products that contain nicotine or tobacco, such as cigarettes and e-cigarettes. Smoking may slow the healing of any bone and joint problems that you may have. If you need help quitting, ask your health care provider. Contact a health care provider if:  Your knee pain continues, changes, or gets worse.  You have a fever along with knee pain.  Your knee buckles or locks up.  Your knee swells, and the swelling becomes worse. Get help right away if:  Your knee feels warm to the touch.  You cannot move your knee.  You have severe pain in your knee.  You have chest pain.  You have trouble breathing. Summary  Knee pain in adults is common. It can be caused by many things, including, arthritis, infection, cysts,  or injury.  Knee pain is usually not a sign of a serious problem, but if it does not go away, a health care provider may perform tests to know the cause of the pain.  Pay attention to any changes in your symptoms. Relieve your pain with rest, medicines, light activity, and use of ice.  Get help if your pain continues or becomes very severe, or if your knee buckles or locks up, or if you have chest pain or trouble breathing. This information is not intended to replace advice given to you by your health care provider. Make sure you discuss any questions you have with your health care provider. Document Released: 05/05/2007 Document Revised: 06/28/2016 Document Reviewed: 06/28/2016 Elsevier Interactive Patient Education  2017 [employer].   IF you received an x-ray today, you will receive an invoice from [facility]. Please contact [facility] at [phone] with questions or concerns regarding your invoice.   IF you received labwork today, you will receive an invoice from [facility]. Please contact [facility] at [phone] with questions or concerns regarding your invoice.   Our billing staff will not be able to assist you with questions regarding bills from these companies.  You will be contacted with the lab results as soon as they are available. The fastest  way to get your results is to activate your My Chart account. Instructions are located on the last page of this paperwork. If you have not heard from [place] regarding the results in 2 weeks, please contact this office.      I personally performed the services described in this documentation, which was scribed in my presence. The recorded information has been reviewed and considered for accuracy and completeness, addended by me as needed, and agree with information above.  Signed,   Merri Ray, MD [facility] [place].  10/21/16 6:59 PM

## 2016-10-21 NOTE — Patient Instructions (Addendum)
Please select new primary care provider, and recheck in next 3 months (possibly sooner depending on your hemoglobin A1c).  Will need to check cholesterol when fasting - Return at your convenience for fasting labs - 8 hours.   Pneumovax (pneumonia vaccine) is recommended.  Let me know if you change your mind on this.   Knee exam is overall reassuring. Tylenol if needed, but if pain not continuing to improve next 2 weeks - return for recheck and possible xray.   Return to the clinic or go to the nearest emergency room if any of your symptoms worsen or new symptoms occur.   Diabetes Mellitus and Standards of Medical Care Managing diabetes (diabetes mellitus) can be complicated. Your diabetes treatment may be managed by a team of health care providers, including:  A diet and nutrition specialist (registered dietitian).  A nurse.  A certified diabetes educator (CDE).  A diabetes specialist (endocrinologist).  An eye doctor.  A primary care provider.  A dentist. Your health care providers follow a schedule in order to help you get the best quality of care. The following schedule is a general guideline for your diabetes management plan. Your health care providers may also give you more specific instructions. HbA1c ( hemoglobin A1c) test This test provides information about blood sugar (glucose) control over the previous 2-3 months. It is used to check whether your diabetes management plan needs to be adjusted.  If you are meeting your treatment goals, this test is done at least 2 times a year.  If you are not meeting treatment goals or if your treatment goals have changed, this test is done 4 times a year. Blood pressure test  This test is done at every routine medical visit. For most people, the goal is less than 130/80. Ask your health care provider what your goal blood pressure should be. Dental and eye exams  Visit your dentist two times a year.  If you have type 1 diabetes, get  an eye exam 3-5 years after you are diagnosed, and then once a year after your first exam.  If you were diagnosed with type 1 diabetes as a child, get an eye exam when you are age 48 or older and have had diabetes for 3-5 years. After the first exam, you should get an eye exam once a year.  If you have type 2 diabetes, have an eye exam as soon as you are diagnosed, and then once a year after your first exam. Foot care exam  Visual foot exams are done at every routine medical visit. The exams check for cuts, bruises, redness, blisters, sores, or other problems with the feet.  A complete foot exam is done by your health care provider once a year. This exam includes an inspection of the structure and skin of your feet, and a check of the pulses and sensation in your feet.  Type 1 diabetes: Get your first exam 3-5 years after diagnosis.  Type 2 diabetes: Get your first exam as soon as you are diagnosed.  Check your feet every day for cuts, bruises, redness, blisters, or sores. If you have any of these or other problems that are not healing, contact your health care provider. Kidney function test ( urine microalbumin)  This test is done once a year.  Type 1 diabetes: Get your first test 5 years after diagnosis.  Type 2 diabetes: Get your first test as soon as you are diagnosed.  If you have chronic kidney disease (CKD),  get a serum creatinine and estimated glomerular filtration rate (eGFR) test once a year. Lipid profile (cholesterol, HDL, LDL, triglycerides)  This test should be done when you are diagnosed with diabetes, and every 5 years after the first test. If you are on medicines to lower your cholesterol, you may need to get this test done every year.  The goal for LDL is less than 100 mg/dL (5.5 mmol/L). If you are at high risk, the goal is less than 70 mg/dL (3.9 mmol/L).    The goal for triglycerides is less than 150 mg/dL (8.3 mmol/L). Immunizations  The yearly flu  (influenza) vaccine is recommended for everyone 6 months or older who has diabetes.  The pneumonia (pneumococcal) vaccine is recommended for everyone 2 years or older who has diabetes. If you are 58 or older, you may get the pneumonia vaccine as a series of two separate shots.  The hepatitis B vaccine is recommended for adults shortly after they have been diagnosed with diabetes.     Mental and emotional health  Screening for symptoms of eating disorders, anxiety, and depression is recommended at the time of diagnosis and afterward as needed. If your screening shows that you have symptoms (you have a positive screening result), you may need further evaluation and be referred to a mental health care provider. Diabetes self-management education  Education about how to manage your diabetes is recommended at diagnosis and ongoing as needed. Treatment plan  Your treatment plan will be reviewed at every medical visit. Summary  Managing diabetes (diabetes mellitus) can be complicated. Your diabetes treatment may be managed by a team of health care providers.  Your health care providers follow a schedule in order to help you get the best quality of care.  Standards of care including having regular physical exams, blood tests, blood pressure monitoring, immunizations, screening tests, and education about how to manage your diabetes.  Your health care providers may also give you more specific instructions based on your individual health. This information is not intended to replace advice given to you by your health care provider. Make sure you discuss any questions you have with your health care provider. Document Released: 05/05/2009 Document Revised: 04/05/2016 Document Reviewed: 04/05/2016 Elsevier Interactive Patient Education  2017 [employer]   Knee Pain, Adult Knee pain in adults is common. It can be caused by many things, including:  Arthritis.  A fluid-filled sac (cyst) or  growth in your knee.  An infection in your knee.  An injury that will not heal.  Damage, swelling, or irritation of the tissues that support your knee. Knee pain is usually not a sign of a serious problem. The pain may go away on its own with time and rest. If it does not, a health care provider may order tests to find the cause of the pain. These may include:  Imaging tests, such as an X-ray, MRI, or ultrasound.  Joint aspiration. In this test, fluid is removed from the knee.  Arthroscopy. In this test, a lighted tube is inserted into knee and an image is projected onto a TV screen.  A biopsy. In this test, a sample of tissue is removed from the body and studied under a microscope. Follow these instructions at home: Pay attention to any changes in your symptoms. Take these actions to relieve your pain. Activity   Rest your knee.  Do not do things that cause pain or make pain worse.  Avoid high-impact activities or exercises, such as running,  jumping rope, or doing jumping jacks. General instructions   Take over-the-counter and prescription medicines only as told by your health care provider.  Raise (elevate) your knee above the level of your heart when you are sitting or lying down.  Sleep with a pillow under your knee.  If directed, apply ice to the knee:  Put ice in a plastic bag.  Place a towel between your skin and the bag.  Leave the ice on for 20 minutes, 2-3 times a day.  Ask your health care provider if you should wear an elastic knee support.  Lose weight if you are overweight. Extra weight can put pressure on your knee.  Do not use any products that contain nicotine or tobacco, such as cigarettes and e-cigarettes. Smoking may slow the healing of any bone and joint problems that you may have. If you need help quitting, ask your health care provider. Contact a health care provider if:  Your knee pain continues, changes, or gets worse.  You have a fever along  with knee pain.  Your knee buckles or locks up.  Your knee swells, and the swelling becomes worse. Get help right away if:  Your knee feels warm to the touch.  You cannot move your knee.  You have severe pain in your knee.  You have chest pain.  You have trouble breathing. Summary  Knee pain in adults is common. It can be caused by many things, including, arthritis, infection, cysts, or injury.  Knee pain is usually not a sign of a serious problem, but if it does not go away, a health care provider may perform tests to know the cause of the pain.  Pay attention to any changes in your symptoms. Relieve your pain with rest, medicines, light activity, and use of ice.  Get help if your pain continues or becomes very severe, or if your knee buckles or locks up, or if you have chest pain or trouble breathing. This information is not intended to replace advice given to you by your health care provider. Make sure you discuss any questions you have with your health care provider. Document Released: 05/05/2007 Document Revised: 06/28/2016 Document Reviewed: 06/28/2016 Elsevier Interactive Patient Education  2017 [employer].   IF you received an x-ray today, you will receive an invoice from [facility]. Please contact [facility] at [phone] with questions or concerns regarding your invoice.   IF you received labwork today, you will receive an invoice from [facility]. Please contact [facility] at [phone] with questions or concerns regarding your invoice.   Our billing staff will not be able to assist you with questions regarding bills from these companies.  You will be contacted with the lab results as soon as they are available. The fastest way to get your results is to activate your My Chart account. Instructions are located on the last page of this paperwork. If you have not heard from [place] regarding the results in 2 weeks, please contact this office.

## 2016-10-22 ENCOUNTER — Ambulatory Visit (INDEPENDENT_AMBULATORY_CARE_PROVIDER_SITE_OTHER): Payer: 59 | Admitting: Urgent Care

## 2016-10-22 VITALS — BP 128/78 | HR 76 | Temp 97.7°F | Resp 16 | Ht 69.0 in | Wt 232.0 lb

## 2016-10-22 DIAGNOSIS — E669 Obesity, unspecified: Secondary | ICD-10-CM | POA: Diagnosis not present

## 2016-10-22 DIAGNOSIS — I10 Essential (primary) hypertension: Secondary | ICD-10-CM

## 2016-10-22 LAB — COMPREHENSIVE METABOLIC PANEL
ALBUMIN: 3.9 g/dL (ref 3.5–5.5)
ALK PHOS: 63 IU/L (ref 39–117)
ALT: 18 IU/L (ref 0–44)
AST: 17 IU/L (ref 0–40)
Albumin/Globulin Ratio: 1 — ABNORMAL LOW (ref 1.2–2.2)
BILIRUBIN TOTAL: 0.6 mg/dL (ref 0.0–1.2)
BUN / CREAT RATIO: 12 (ref 9–20)
BUN: 13 mg/dL (ref 6–24)
CHLORIDE: 93 mmol/L — AB (ref 96–106)
CO2: 23 mmol/L (ref 18–29)
CREATININE: 1.13 mg/dL (ref 0.76–1.27)
Calcium: 9.3 mg/dL (ref 8.7–10.2)
GFR calc Af Amer: 88 mL/min/{1.73_m2} (ref >59)
GFR calc non Af Amer: 76 mL/min/{1.73_m2} (ref >59)
GLOBULIN, TOTAL: 4 g/dL (ref 1.5–4.5)
Glucose: 560 mg/dL (ref 65–99)
Potassium: 4.8 mmol/L (ref 3.5–5.2)
SODIUM: 133 mmol/L — AB (ref 134–144)
Total Protein: 7.9 g/dL (ref 6.0–8.5)

## 2016-10-22 LAB — HEMOGLOBIN A1C
Est. average glucose Bld gHb Est-mCnc: 266 mg/dL
Hgb A1c MFr Bld: 10.9 % — ABNORMAL HIGH (ref 4.8–5.6)

## 2016-10-22 NOTE — Patient Instructions (Addendum)
Erectile Dysfunction Erectile dysfunction (ED) is the inability to get or keep an erection in order to have sexual intercourse. Erectile dysfunction may include:  Inability to get an erection.  Lack of enough hardness of the erection to allow penetration.  Loss of the erection before sex is finished. What are the causes? This condition may be caused by:  Certain medicines, such as:  Pain relievers.  Antihistamines.  Antidepressants.  Blood pressure medicines.  Water pills (diuretics).  Ulcer medicines.  Muscle relaxants.  Drugs.  Excessive drinking.  Psychological causes, such as:  Anxiety.  Depression.  Sadness.  Exhaustion.  Performance fear.  Stress.  Physical causes, such as:  Artery problems. This may include diabetes, smoking, liver disease, or atherosclerosis.  High blood pressure.  Hormonal problems, such as low testosterone.  Obesity.  Nerve problems. This may include back or pelvic injuries, diabetes mellitus, multiple sclerosis, or Parkinson disease. What are the signs or symptoms? Symptoms of this condition include:  Inability to get an erection.  Lack of enough hardness of the erection to allow penetration.  Loss of the erection before sex is finished.  Normal erections at some times, but with frequent unsatisfactory episodes.  Low sexual satisfaction in either partner due to erection problems.  A curved penis occurring with erection. The curve may cause pain or the penis may be too curved to allow for intercourse.  Never having nighttime erections. How is this diagnosed? This condition is often diagnosed by:  Performing a physical exam to find other diseases or specific problems with the penis.  Asking you detailed questions about the problem.  Performing blood tests to check for diabetes mellitus or to measure hormone levels.  Performing other tests to check for underlying health conditions.  Performing an ultrasound  exam to check for scarring.  Performing a test to check blood flow to the penis.  Doing a sleep study at home to measure nighttime erections. How is this treated? This condition may be treated by:  Medicine taken by mouth to help you achieve an erection (oral medicine).  Hormone replacement therapy to replace low testosterone levels.  Medicine that is injected into the penis. Your health care provider may instruct you how to give yourself these injections at home.  Vacuum pump. This is a pump with a ring on it. The pump and ring are placed on the penis and used to create pressure that helps the penis become erect.  Penile implant surgery. In this procedure, you may receive:  An inflatable implant. This consists of cylinders, a pump, and a reservoir. The cylinders can be inflated with a fluid that helps to create an erection, and they can be deflated after intercourse.  A semi-rigid implant. This consists of two silicone rubber rods. The rods provide some rigidity. They are also flexible, so the penis can both curve downward in its normal position and become straight for sexual intercourse.  Blood vessel surgery, to improve blood flow to the penis. During this procedure, a blood vessel from a different part of the body is placed into the penis to allow blood to flow around (bypass) damaged or blocked blood vessels.  Lifestyle changes, such as exercising more, losing weight, and quitting smoking. Follow these instructions at home: Medicines   Take over-the-counter and prescription medicines only as told by your health care provider. Do not increase the dosage without first discussing it with your health care provider.  If you are using self-injections, perform injections as directed by your  health care provider. Make sure to avoid any veins that are on the surface of the penis. After giving an injection, apply pressure to the injection site for 5 minutes. General instructions    Exercise regularly, as directed by your health care provider. Work with your health care provider to lose weight, if needed.  Do not use any products that contain nicotine or tobacco, such as cigarettes and e-cigarettes. If you need help quitting, ask your health care provider.  Before using a vacuum pump, read the instructions that come with the pump and discuss any questions with your health care provider.  Keep all follow-up visits as told by your health care provider. This is important. Contact a health care provider if:  You feel nauseous.  You vomit. Get help right away if:  You are taking oral or injectable medicines and you have an erection that lasts longer than 4 hours. If your health care provider is unavailable, go to the nearest emergency room for evaluation. An erection that lasts much longer than 4 hours can result in permanent damage to your penis.  You have severe pain in your groin or abdomen.  You develop redness or severe swelling of your penis.  You have redness spreading up into your groin or lower abdomen.  You are unable to urinate.  You experience chest pain or a rapid heart beat (palpitations) after taking oral medicines. Summary  Erectile dysfunction (ED) is the inability to get or keep an erection during sexual intercourse. This problem can usually be treated successfully.  This condition is diagnosed based on a physical exam, your symptoms, and tests to determine the cause. Treatment varies depending on the cause, and may include medicines, hormone therapy, surgery, or vacuum pump.  You may need follow-up visits to make sure that you are using your medicines or devices correctly.  Get help right away if you are taking or injecting medicines and you have an erection that lasts longer than 4 hours. This information is not intended to replace advice given to you by your health care provider. Make sure you discuss any questions you have with your health  care provider. Document Released: 07/05/2000 Document Revised: 07/24/2016 Document Reviewed: 07/24/2016 Elsevier Interactive Patient Education  2017 [employer].     IF you received an x-ray today, you will receive an invoice from [facility]. Please contact [facility] at [phone] with questions or concerns regarding your invoice.   IF you received labwork today, you will receive an invoice from [place]. Please contact [facility] at [phone] with questions or concerns regarding your invoice.   Our billing staff will not be able to assist you with questions regarding bills from these companies.  You will be contacted with the lab results as soon as they are available. The fastest way to get your results is to activate your My Chart account. Instructions are located on the last page of this paperwork. If you have not heard from [place] regarding the results in 2 weeks, please contact this office.

## 2016-10-22 NOTE — Progress Notes (Signed)
MRN: [id number] DOB: [birth year]  Subjective:   Sean Schaefer is a 51 y.o. male presenting for follow up on labs and ED. Patient was seen yesterday at our clinic. He was advised to rtc today for fasting lipid panel and urine sample. He would also like to discuss ED. For the past year, patient states that he has had some difficulty obtaining and maintaining erections. He has never had this issue before. Has a diagnosis of diabetes, HTN and does not know what his cholesterol level is like. Denies smoking cigarettes or drinking alcohol.    Sean Schaefer has a current medication list which includes the following prescription(s): amlodipine, glipizide, and ramipril. Also has No Known Allergies.  Sean Schaefer  has a past medical history of Diabetes mellitus without complication ([place]); Hypertension; and Ulcerative colitis ([place]). Also  has a past surgical history that includes Perianal fistula repair (2012) and Colon surgery.  Objective:   Vitals: BP 128/78 (BP Location: Right Arm, Patient Position: Sitting, Cuff Size: Large)   Pulse 76   Temp 97.7 F (36.5 C) (Oral)   Resp 16   Ht 5' 9"  (1.753 m)   Wt 232 lb (105.2 kg)   SpO2 96%   BMI 34.26 kg/m   Physical Exam  Constitutional: He is oriented to person, place, and time. He appears well-developed and well-nourished.  Cardiovascular: Normal rate.   Pulmonary/Chest: Effort normal.  Neurological: He is alert and oriented to person, place, and time.  Psychiatric: He has a normal mood and affect.   Results for orders placed or performed in visit on 10/21/16 (from the past 24 hour(s))  Hemoglobin A1c     Status: None (Preliminary result)   Collection Time: 10/21/16  9:57 AM  Result Value Ref Range   Hgb A1c MFr Bld WILL FOLLOW    Est. average glucose Bld gHb Est-mCnc WILL FOLLOW    Narrative   Performed at:  [facility] [address], [place], [place]  [id number] Lab Director: Lindon Romp MD, Phone:  [phone] Specimen Comment:  Test(s) Glucose called to Dr Sean Schaefer on 10/22/2016 at 01:51 EST  Comprehensive metabolic panel     Status: Abnormal   Collection Time: 10/21/16  9:57 AM  Result Value Ref Range   Glucose 560 (>) 65 - 99 mg/dL   BUN 13 6 - 24 mg/dL   Creatinine, Ser 1.13 0.76 - 1.27 mg/dL   GFR calc non Af Amer 76 >59 mL/min/1.73   GFR calc Af Amer 88 >59 mL/min/1.73   BUN/Creatinine Ratio 12 9 - 20   Sodium 133 (L) 134 - 144 mmol/L   Potassium 4.8 3.5 - 5.2 mmol/L   Chloride 93 (L) 96 - 106 mmol/L   CO2 23 18 - 29 mmol/L   Calcium 9.3 8.7 - 10.2 mg/dL   Total Protein 7.9 6.0 - 8.5 g/dL   Albumin 3.9 3.5 - 5.5 g/dL   Globulin, Total 4.0 1.5 - 4.5 g/dL   Albumin/Globulin Ratio 1.0 (L) 1.2 - 2.2   Bilirubin Total 0.6 0.0 - 1.2 mg/dL   Alkaline Phosphatase 63 39 - 117 IU/L   AST 17 0 - 40 IU/L   ALT 18 0 - 44 IU/L   Narrative   Performed at:  [address], [place], [place]  [id number] Lab Director: Lindon Romp MD, Phone:  [phone] Specimen Comment: Test(s) Glucose called to Dr Sean Schaefer on 10/22/2016 at 01:51 EST   Assessment and Plan :  1. Obesity (BMI 30-39.9) 2. Essential hypertension - Labs pending. Discussed differential for ED. Patient will try to address his diabetes and cholesterol. He is not interested in trying medical therapy for ED at this time.  Sean Eagles, PA-C [facility] [phone] 10/22/2016 8:24 AM

## 2016-10-23 LAB — LIPID PANEL
CHOL/HDL RATIO: 6 ratio — AB (ref 0.0–5.0)
Cholesterol, Total: 228 mg/dL — ABNORMAL HIGH (ref 100–199)
HDL: 38 mg/dL — AB (ref >39)
Triglycerides: 484 mg/dL — ABNORMAL HIGH (ref 0–149)

## 2016-10-23 LAB — MICROALBUMIN / CREATININE URINE RATIO
Creatinine, Urine: 58.5 mg/dL
Microalbumin, Urine: <3 ug/mL

## 2016-10-25 ENCOUNTER — Ambulatory Visit (INDEPENDENT_AMBULATORY_CARE_PROVIDER_SITE_OTHER): Payer: 59

## 2016-10-25 ENCOUNTER — Ambulatory Visit (INDEPENDENT_AMBULATORY_CARE_PROVIDER_SITE_OTHER): Payer: 59 | Admitting: Urgent Care

## 2016-10-25 VITALS — BP 115/66 | HR 101 | Temp 98.8°F | Resp 18 | Ht 69.0 in | Wt 227.0 lb

## 2016-10-25 DIAGNOSIS — M25562 Pain in left knee: Secondary | ICD-10-CM

## 2016-10-25 DIAGNOSIS — M1712 Unilateral primary osteoarthritis, left knee: Secondary | ICD-10-CM | POA: Diagnosis not present

## 2016-10-25 DIAGNOSIS — M7989 Other specified soft tissue disorders: Secondary | ICD-10-CM | POA: Diagnosis not present

## 2016-10-25 IMAGING — DX DG KNEE COMPLETE 4+V*L*
4 series · 4 of 4 positions shown · non-contrast
Comparison: None.

CLINICAL DATA: Left knee pain and swelling.

EXAM:
LEFT KNEE - COMPLETE 4+ VIEW

[knee ap]
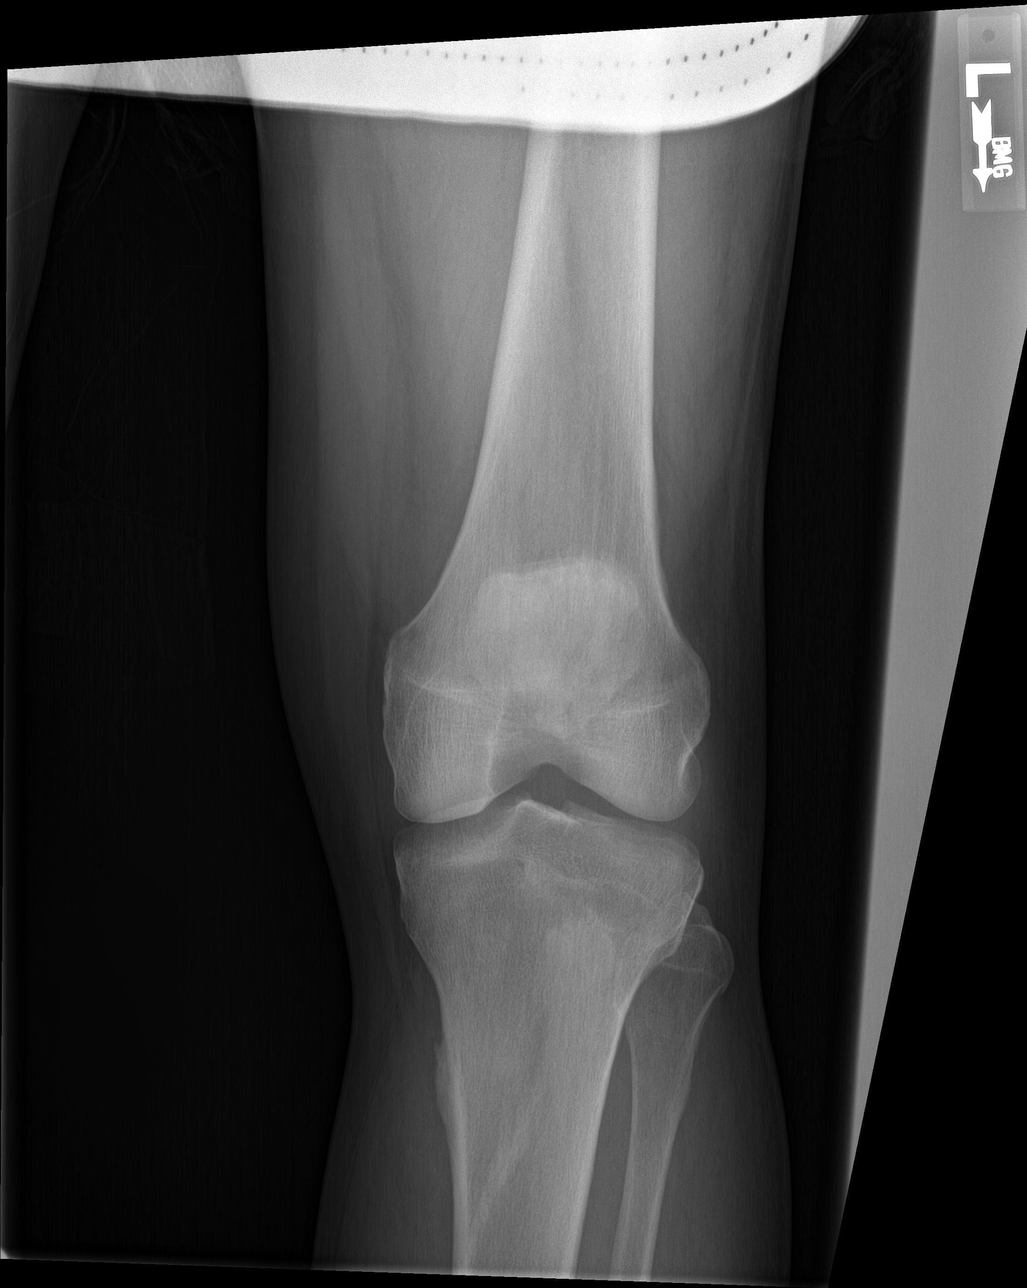

[knee lat]
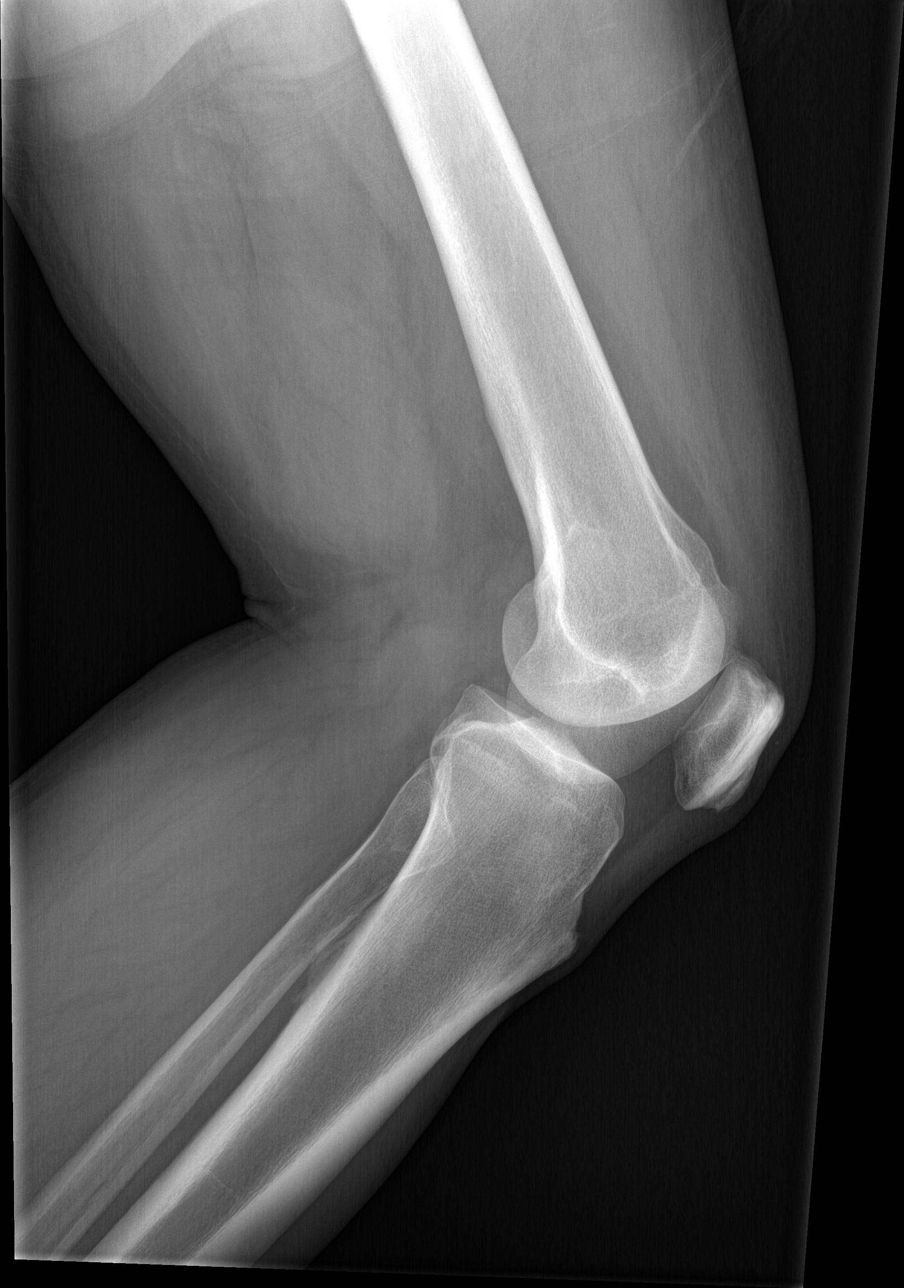

[sunrise]
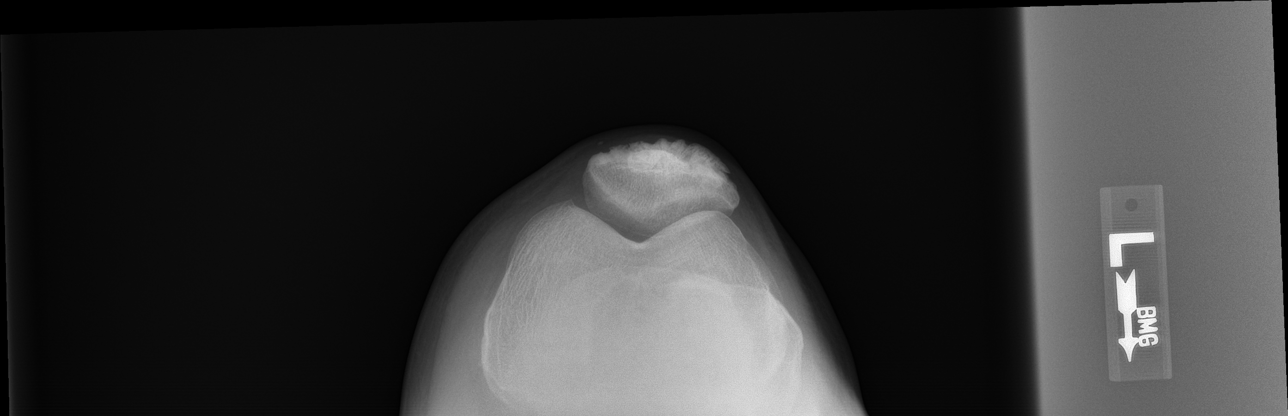

[knee [person_name]]
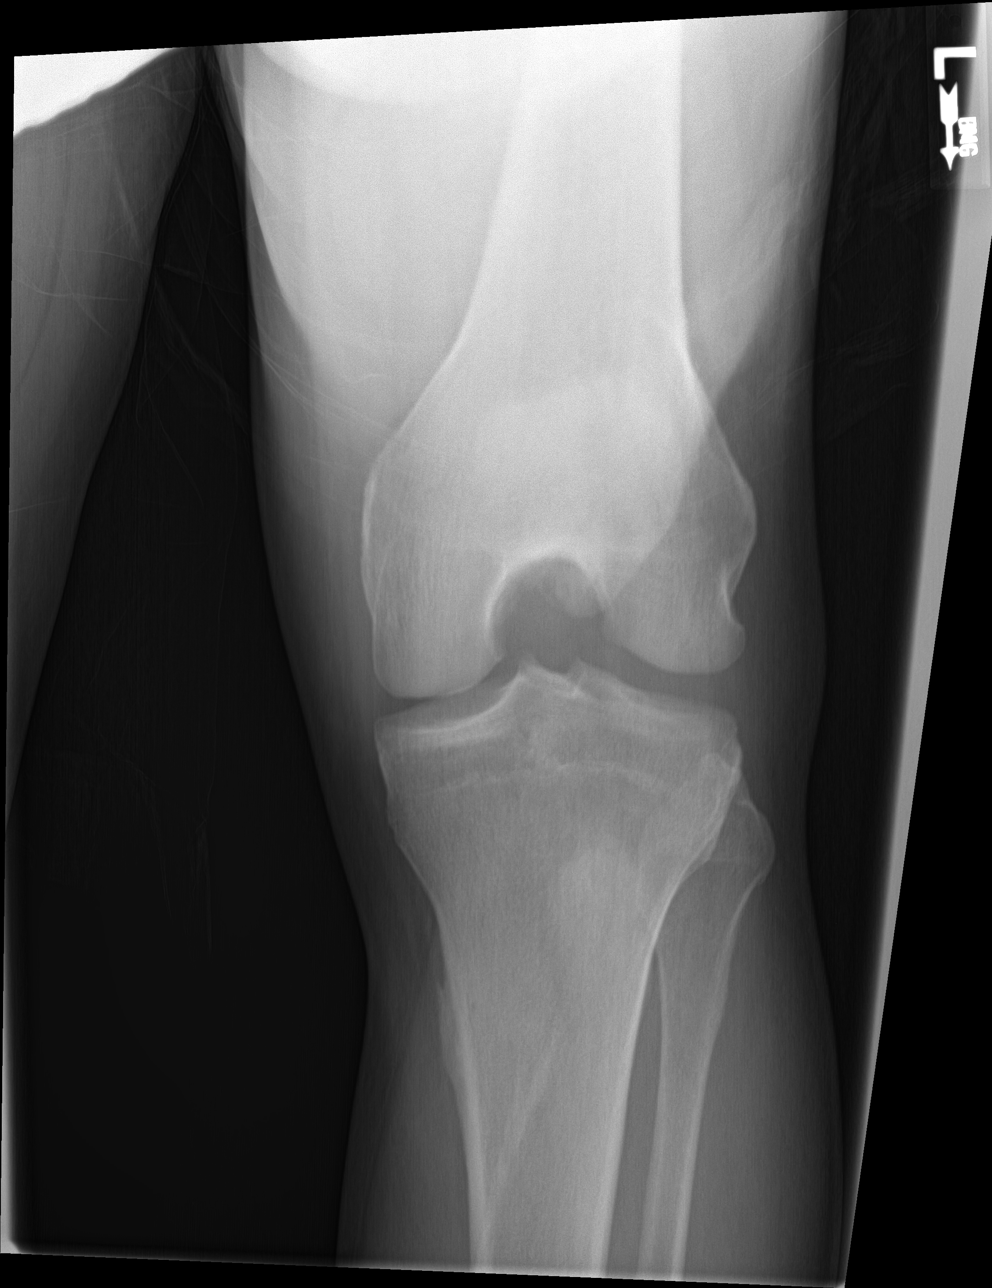

[4 of 4 positions shown; findings below may reference images not displayed]

FINDINGS: There is no fracture or dislocation or joint effusion. Chronic
prominence at the origin and insertion of the patellar tendon.
IMPRESSION: No acute abnormality. Degenerative changes associated with the
patellar tendon.

## 2016-10-25 MED ORDER — NAPROXEN SODIUM 550 MG PO TABS: 550.0000 mg | ORAL_TABLET | Freq: Two times a day (BID) | ORAL | 1 refills | Status: DC

## 2016-10-25 MED FILL — NAPROXEN SODIUM 550 MG TAB: 550 | 15 days supply | Qty: 30 | Fill #0

## 2016-10-25 NOTE — Patient Instructions (Addendum)
Arthritis Arthritis is a term that is commonly used to refer to joint pain or joint disease. There are more than 100 types of arthritis. What are the causes? The most common cause of this condition is wear and tear of a joint. Other causes include:  Gout.  Inflammation of a joint.  An infection of a joint.  Sprains and other injuries near the joint.  A drug reaction or allergic reaction. In some cases, the cause may not be known. What are the signs or symptoms? The main symptom of this condition is pain in the joint with movement. Other symptoms include:  Redness, swelling, or stiffness at a joint.  Warmth coming from the joint.  Fever.  Overall feeling of illness. How is this diagnosed? This condition may be diagnosed with a physical exam and tests, including:  Blood tests.  Urine tests.  Imaging tests, such as MRI, X-rays, or a CT scan. Sometimes, fluid is removed from a joint for testing. How is this treated? Treatment for this condition may involve:  Treatment of the cause, if it is known.  Rest.  Raising (elevating) the joint.  Applying cold or hot packs to the joint.  Medicines to improve symptoms and reduce inflammation.  Injections of a steroid such as cortisone into the joint to help reduce pain and inflammation. Depending on the cause of your arthritis, you may need to make lifestyle changes to reduce stress on your joint. These changes may include exercising more and losing weight. Follow these instructions at home: Medicines   Take over-the-counter and prescription medicines only as told by your health care provider.  Do not take aspirin to relieve pain if gout is suspected. Activity   Rest your joint if told by your health care provider. Rest is important when your disease is active and your joint feels painful, swollen, or stiff.  Avoid activities that make the pain worse. It is important to balance activity with rest.  Exercise your joint  regularly with range-of-motion exercises as told by your health care provider. Try doing low-impact exercise, such as:  Swimming.  Water aerobics.  Biking.  Walking. Joint Care    If your joint is swollen, keep it elevated if told by your health care provider.  If your joint feels stiff in the morning, try taking a warm shower.  If directed, apply heat to the joint. If you have diabetes, do not apply heat without permission from your health care provider.  Put a towel between the joint and the hot pack or heating pad.  Leave the heat on the area for 20-30 minutes.  If directed, apply ice to the joint:  Put ice in a plastic bag.  Place a towel between your skin and the bag.  Leave the ice on for 20 minutes, 2-3 times per day.  Keep all follow-up visits as told by your health care provider. This is important. Contact a health care provider if:  The pain gets worse.  You have a fever. Get help right away if:  You develop severe joint pain, swelling, or redness.  Many joints become painful and swollen.  You develop severe back pain.  You develop severe weakness in your leg.  You cannot control your bladder or bowels. This information is not intended to replace advice given to you by your health care provider. Make sure you discuss any questions you have with your health care provider. Document Released: 08/15/2004 Document Revised: 12/14/2015 Document Reviewed: 10/03/2014 Elsevier Interactive Patient Education  2017 [employer] for Routine Care of Injuries Theroutine careofmanyinjuriesincludes rest, ice, compression, and elevation (RICE therapy). RICE therapy is often recommended for injuries to soft tissues, such as a muscle strain, ligament injuries, bruises, and overuse injuries. It can also be used for some bony injuries. Using RICE therapy can help to relieve pain, lessen swelling, and enable your body to heal. Rest Rest is required to allow  your body to heal. This usually involves reducing your normal activities and avoiding use of the injured part of your body. Generally, you can return to your normal activities when you are comfortable and have been given permission by your health care provider. Ice   Icing your injury helps to keep the swelling down, and it lessens pain. Do not apply ice directly to your skin.  Put ice in a plastic bag.  Place a towel between your skin and the bag.  Leave the ice on for 20 minutes, 2-3 times a day. Do this for as long as you are directed by your health care provider. Compression Compression means putting pressure on the injured area. Compression helps to keep swelling down, gives support, and helps with discomfort. Compression may be done with an elastic bandage. If an elastic bandage has been applied, follow these general tips:  Remove and reapply the bandage every 3-4 hours or as directed by your health care provider.  Make sure the bandage is not wrapped too tightly, because this can cut off circulation. If part of your body beyond the bandage becomes blue, numb, cold, swollen, or more painful, your bandage is most likely too tight. If this occurs, remove your bandage and reapply it more loosely.  See your health care provider if the bandage seems to be making your problems worse rather than better. Elevation   Elevation means keeping the injured area raised. This helps to lessen swelling and decrease pain. If possible, your injured area should be elevated at or above the level of your heart or the center of your chest. When should I seek medical care?  If your pain and swelling continue.  If your symptoms are getting worse rather than improving. These symptoms may indicate that further evaluation or further X-rays are needed. Sometimes, X-rays may not show a small broken bone (fracture) until a number of days later. Make a follow-up appointment with your health care provider. When  should I seek immediate medical care?  If you have sudden severe pain at or below the area of your injury.  If you have redness or increased swelling around your injury.  If you have tingling or numbness at or below the area of your injury that does not improve after you remove the elastic bandage. This information is not intended to replace advice given to you by your health care provider. Make sure you discuss any questions you have with your health care provider. Document Released: 10/20/2000 Document Revised: 12/12/2015 Document Reviewed: 06/15/2014 Elsevier Interactive Patient Education  2017 [employer].

## 2016-10-25 NOTE — Progress Notes (Signed)
  MRN: [id number] DOB: 1965-11-26  Subjective:   Sean Schaefer is a 51 y.o. male presenting for chief complaint of Knee Pain  Reports 2 week history of left knee injury, twisted his knee cutting grass on a hill. He has been seen before for this. Has been seen here before for this on 10/21/2016, but since then has had worsening knee pain. Had swelling intermittently, pain over lateral aspect of his left knee. Has been using APAP, ibuprofen, elevating, icing. Has not rested.   [name] has a current medication list which includes the following prescription(s): amlodipine, glipizide, and ramipril. Also has No Known Allergies.  [name]  has a past medical history of Diabetes mellitus without complication ([place]); Hypertension; and Ulcerative colitis ([place]). Also  has a past surgical history that includes Perianal fistula repair (2012) and Colon surgery.  Objective:   Vitals: BP 115/66   Pulse (!) 101   Temp 98.8 F (37.1 C) (Oral)   Resp 18   Ht 5' 9"  (1.753 m)   Wt 227 lb (103 kg)   SpO2 99%   BMI 33.52 kg/m   Physical Exam  Constitutional: He is oriented to person, place, and time. He appears well-developed and well-nourished.  Cardiovascular: Normal rate.   Pulmonary/Chest: Effort normal.  Musculoskeletal:       Left knee: He exhibits decreased range of motion (extension), swelling (trace) and abnormal meniscus (mildly positive McMurray test). He exhibits no effusion, no ecchymosis, no deformity, no laceration, no erythema, normal alignment, normal patellar mobility and no bony tenderness. Tenderness found. Medial joint line and lateral joint line tenderness noted. No patellar tendon tenderness noted.  Neurological: He is alert and oriented to person, place, and time.   Dg Knee Complete 4 Views Left  Result Date: 10/25/2016 CLINICAL DATA:  Left knee pain and swelling. EXAM: LEFT KNEE - COMPLETE 4+ VIEW COMPARISON:  None. FINDINGS: There is no fracture or dislocation or joint effusion.  Chronic prominence at the origin and insertion of the patellar tendon. IMPRESSION: No acute abnormality. Degenerative changes associated with the patellar tendon. Electronically Signed   By: Lorriane Shire M.D.   On: 10/25/2016 16:23   Assessment and Plan :   1. Acute pain of left knee 2. Osteoarthritis of left knee, unspecified osteoarthritis type - Will use conservative management. NSAID plus OA reaction knee brace. Will try this for 2 weeks, if no improvement will refer to orthopedics. - naproxen sodium (ANAPROX DS) 550 MG tablet; Take 1 tablet (550 mg total) by mouth 2 (two) times daily with a meal.  Dispense: 30 tablet; Refill: 1   Jaynee Eagles, [facility] [phone] 10/25/2016  4:01 PM

## 2016-10-30 ENCOUNTER — Other Ambulatory Visit: Payer: Self-pay | Admitting: Family Medicine

## 2016-10-30 MED ORDER — ATORVASTATIN CALCIUM 10 MG PO TABS: 10.0000 mg | ORAL_TABLET | Freq: Every day | ORAL | 2 refills | Status: DC

## 2016-10-30 MED FILL — ATORVASTATIN 10 MG TABLET: 10 | 30 days supply | Qty: 30 | Fill #0

## 2016-11-01 ENCOUNTER — Ambulatory Visit (INDEPENDENT_AMBULATORY_CARE_PROVIDER_SITE_OTHER): Payer: 59 | Admitting: Family Medicine

## 2016-11-01 VITALS — BP 111/74 | HR 87 | Temp 97.5°F | Resp 16 | Ht 69.0 in | Wt 227.6 lb

## 2016-11-01 DIAGNOSIS — E782 Mixed hyperlipidemia: Secondary | ICD-10-CM | POA: Diagnosis not present

## 2016-11-01 DIAGNOSIS — IMO0001 Reserved for inherently not codable concepts without codable children: Secondary | ICD-10-CM

## 2016-11-01 DIAGNOSIS — E1165 Type 2 diabetes mellitus with hyperglycemia: Secondary | ICD-10-CM | POA: Diagnosis not present

## 2016-11-01 LAB — GLUCOSE, POCT (MANUAL RESULT ENTRY): POC Glucose: 335 mg/dl — AB (ref 70–99)

## 2016-11-01 MED ORDER — GLIPIZIDE 10 MG PO TABS: 10.0000 mg | ORAL_TABLET | Freq: Two times a day (BID) | ORAL | 3 refills | Status: DC

## 2016-11-01 MED FILL — glipiZIDE 10 MG TABS: 10 | 30 days supply | Qty: 60 | Fill #0

## 2016-11-01 NOTE — Patient Instructions (Addendum)
Start Lipitor as planned. If any new muscle aches, return to discuss the symptoms.  Check your blood sugar once per day at various times including fasting, and 2 hours after meals. Bring a copy of those readings in the next 2-3 weeks to see if other dosing changes are needed. Change glipizide to 68m twice per day, and work on diet (can schedule nutrition visit).  If blood sugar still elevated at next visit, will likely add other medication for improved control. Watch for low blood sugars on new meds - see below for precautions on hypoglycemia.   Hypoglycemia Hypoglycemia occurs when the level of sugar (glucose) in the blood is too low. Glucose is a type of sugar that provides the body's main source of energy. Certain hormones (insulin and glucagon) control the level of glucose in the blood. Insulin lowers blood glucose, and glucagon increases blood glucose. Hypoglycemia can result from having too much insulin in the bloodstream, or from not eating enough food that contains glucose. Hypoglycemia can happen in people who do or do not have diabetes. It can develop quickly, and it can be a medical emergency. What are the causes? Hypoglycemia occurs most often in people who have diabetes. If you have diabetes, hypoglycemia may be caused by:  Diabetes medicine.  Not eating enough, or not eating often enough.  Increased physical activity.  Drinking alcohol, especially when you have not eaten recently. If you do not have diabetes, hypoglycemia may be caused by:  A tumor in the pancreas. The pancreas is the organ that makes insulin.  Not eating enough, or not eating for long periods at a time (fasting).  Severe infection or illness that affects the liver, heart, or kidneys.  Certain medicines. You may also have reactive hypoglycemia. This condition causes hypoglycemia within 4 hours of eating a meal. This may occur after having stomach surgery. Sometimes, the cause of reactive hypoglycemia is not  known. What increases the risk? Hypoglycemia is more likely to develop in:  People who have diabetes and take medicines to lower blood glucose.  People who abuse alcohol.  People who have a severe illness. What are the signs or symptoms? Hypoglycemia may not cause any symptoms. If you have symptoms, they may include:  Hunger.  Anxiety.  Sweating and feeling clammy.  Confusion.  Dizziness or feeling light-headed.  Sleepiness.  Nausea.  Increased heart rate.  Headache.  Blurry vision.  Seizure.  Nightmares.  Tingling or numbness around the mouth, lips, or tongue.  A change in speech.  Decreased ability to concentrate.  A change in coordination.  Restless sleep.  Tremors or shakes.  Fainting.  Irritability. How is this diagnosed? Hypoglycemia is diagnosed with a blood test to measure your blood glucose level. This blood test is done while you are having symptoms. Your health care provider may also do a physical exam and review your medical history. If you do not have diabetes, other tests may be done to find the cause of your hypoglycemia. How is this treated? This condition can often be treated by immediately eating or drinking something that contains glucose, such as:  3-4 sugar tablets (glucose pills).  Glucose gel, 15-gram tube.  Fruit juice, 4 oz (120 mL).  Regular soda (not diet soda), 4 oz (120 mL).  Low-fat milk, 4 oz (120 mL).  Several pieces of hard candy.  Sugar or honey, 1 Tbsp. Treating Hypoglycemia If You Have Diabetes   If you are alert and able to swallow safely, follow the  15:15 rule:  Take 15 grams of a rapid-acting carbohydrate. Rapid-acting options include:  1 tube of glucose gel.  3 glucose pills.  6-8 pieces of hard candy.  4 oz (120 mL) of fruit juice.  4 oz (120 ml) of regular (not diet) soda.  Check your blood glucose 15 minutes after you take the carbohydrate.  If the repeat blood glucose level is still at  or below 70 mg/dL (3.9 mmol/L), take 15 grams of a carbohydrate again.  If your blood glucose level does not increase above 70 mg/dL (3.9 mmol/L) after 3 tries, seek emergency medical care.  After your blood glucose level returns to normal, eat a meal or a snack within 1 hour. Treating Severe Hypoglycemia  Severe hypoglycemia is when your blood glucose level is at or below 54 mg/dL (3 mmol/L). Severe hypoglycemia is an emergency. Do not wait to see if the symptoms will go away. Get medical help right away. Call your local emergency services (911 in the [place] Do not drive yourself to the hospital. If you have severe hypoglycemia and you cannot eat or drink, you may need an injection of glucagon. A family member or close friend should learn how to check your blood glucose and how to give you a glucagon injection. Ask your health care provider if you need to have an emergency glucagon injection kit available. Severe hypoglycemia may need to be treated in a hospital. The treatment may include getting glucose through an IV tube. You may also need treatment for the cause of your hypoglycemia. Follow these instructions at home: General instructions   Avoid any diets that cause you to not eat enough food. Talk with your health care provider before you start any new diet.  Take over-the-counter and prescription medicines only as told by your health care provider.  Limit alcohol intake to no more than 1 drink per day for nonpregnant women and 2 drinks per day for men. One drink equals 12 oz of beer, 5 oz of wine, or 1 oz of hard liquor.  Keep all follow-up visits as told by your health care provider. This is important. If You Have Diabetes:    Make sure you know the symptoms of hypoglycemia.  Always have a rapid-acting carbohydrate snack with you to treat low blood sugar.  Follow your diabetes management plan, as told by your health care provider. Make sure you:  Take your medicines as  directed.  Follow your exercise plan.  Follow your meal plan. Eat on time, and do not skip meals.  Check your blood glucose as often as directed. Make sure to check your blood glucose before and after exercise. If you exercise longer or in a different way than usual, check your blood glucose more often.  Follow your sick day plan whenever you cannot eat or drink normally. Make this plan in advance with your health care provider.  Share your diabetes management plan with people in your workplace, school, and household.  Check your urine for ketones when you are ill and as told by your health care provider.  Carry a medical alert card or wear medical alert jewelry. If You Have Reactive Hypoglycemia or Low Blood Sugar From Other Causes:   Monitor your blood glucose as told by your health care provider.  Follow instructions from your health care provider about eating or drinking restrictions. Contact a health care provider if:  You have problems keeping your blood glucose in your target range.  You have frequent episodes  of hypoglycemia. Get help right away if:  You continue to have hypoglycemia symptoms after eating or drinking something containing glucose.  Your blood glucose is at or below 54 mg/dL (3 mmol/L).  You have a seizure.  You faint. These symptoms may represent a serious problem that is an emergency. Do not wait to see if the symptoms will go away. Get medical help right away. Call your local emergency services (911 in the [place] Do not drive yourself to the hospital. This information is not intended to replace advice given to you by your health care provider. Make sure you discuss any questions you have with your health care provider. Document Released: 07/08/2005 Document Revised: 12/20/2015 Document Reviewed: 08/11/2015 Elsevier Interactive Patient Education  2017 [employer].     IF you received an x-ray today, you will receive an invoice from [facility]. Please contact [facility] at [phone] with questions or concerns regarding your invoice.   IF you received labwork today, you will receive an invoice from [facility]. Please contact [facility] at [phone] with questions or concerns regarding your invoice.   Our billing staff will not be able to assist you with questions regarding bills from these companies.  You will be contacted with the lab results as soon as they are available. The fastest way to get your results is to activate your My Chart account. Instructions are located on the last page of this paperwork. If you have not heard from [place] regarding the results in 2 weeks, please contact this office.

## 2016-11-01 NOTE — Progress Notes (Signed)
Subjective:  By signing my name below, I, Sean Schaefer, attest that this documentation has been prepared under the direction and in the presence of Sean Agreste, MD Electronically Signed: Ladene Artist, ED Scribe 11/01/2016 at 9:07 AM.   Patient ID: Sean Schaefer, male    DOB: [birth year], 51 y.o.   MRN: [id number]  Chief Complaint  Patient presents with  . Follow-up    discuss lipitor and labs   HPI Sean Schaefer is a 50 y.o. male who presents to Primary Care at [facility] for follow-up. Last seen April 2. At that time had uncontrolled DM with medication nonadherence as out of medications 3 weeks prior. Had not been seen since June 2017 but was seen by Nutrition in September 2017. Since last visit, he has been seen by Jaynee Eagles for other concerns.   DM Lab Results  Component Value Date   HGBA1C 10.9 (H) 10/21/2016  Blood sugar was 560 at last visit but normal CO2.   He was restarted on glipizide ER 10 mg qd and started on Lipitor of hyperlipidemia Lab Results  Component Value Date   CHOL 228 (H) 10/22/2016   HDL 38 (L) 10/22/2016   [place] Comment 10/22/2016   TRIG 484 (H) 10/22/2016   CHOLHDL 6.0 (H) 10/22/2016  He is unable to take Metformin due to previous colon resection.   Pt is not fasting at this visit. He states that he has not measured his blood sugar at home lately. He denies nausea, vomiting, dizziness, light-headedness, polydipsia, blurred vision. Pt has an ophthalmology appointment in 2 weeks.  Patient Active Problem List   Diagnosis Date Noted  . Diabetes ([place]) 12/26/2014  . 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 Crohn's disease ([place]) 12/18/2013   Past Medical History:  Diagnosis Date  . Diabetes mellitus without complication ([place])   . Hypertension   . Ulcerative colitis [facility])    Past Surgical History:  Procedure Laterality Date  . COLON SURGERY    . Perianal fistula repair  2012   No Known Allergies Prior to Admission medications     Medication Sig Start Date End Date Taking? Authorizing Provider  amLODipine (NORVASC) 10 MG tablet Take 1 tablet (10 mg total) by mouth daily. Reported on 10/12/2015 10/21/16  Yes Sean Agreste, MD  atorvastatin (LIPITOR) 10 MG tablet Take 1 tablet (10 mg total) by mouth daily. 10/30/16  Yes Sean Agreste, MD  glipiZIDE (GLIPIZIDE XL) 10 MG 24 hr tablet TAKE 1 TABLET BY MOUTH ONCE DAILY WITH BREAKFAST 10/21/16  Yes Sean Agreste, MD  naproxen sodium (ANAPROX DS) 550 MG tablet Take 1 tablet (550 mg total) by mouth 2 (two) times daily with a meal. 10/25/16  Yes Jaynee Eagles, PA-C  ramipril (ALTACE) 10 MG capsule Take 1 capsule (10 mg total) by mouth daily. Reported on 10/12/2015 10/21/16  Yes Sean Agreste, MD   Social History   Social History  . Marital status: Married    Spouse name: N/A  . Number of children: N/A  . Years of education: N/A   Occupational History  . Not on file.   Social History Main Topics  . Smoking status: Never Smoker  . Smokeless tobacco: Never Used  . Alcohol use No  . Drug use: No  . Sexual activity: Yes   Other Topics Concern  . Not on file   Social History Narrative  . No narrative on file   Review of Systems  Eyes: Negative  for visual disturbance.  Gastrointestinal: Negative for nausea and vomiting.  Endocrine: Negative for polydipsia.  Neurological: Negative for dizziness and light-headedness.      Objective:   Physical Exam  Constitutional: He is oriented to person, place, and time. He appears well-developed and well-nourished. No distress.  HENT:  Head: Normocephalic and atraumatic.  Eyes: Conjunctivae and EOM are normal.  Neck: Neck supple. No tracheal deviation present.  Cardiovascular: Normal rate, regular rhythm and normal heart sounds.   Pulmonary/Chest: Effort normal and breath sounds normal. No respiratory distress.  Musculoskeletal: Normal range of motion.  Neurological: He is alert and oriented to person, place, and time.  Skin:  Skin is warm and dry.  Psychiatric: He has a normal mood and affect. His behavior is normal.  Nursing note and vitals reviewed.  Vitals:   11/01/16 0854  BP: 111/74  Pulse: 87  Resp: 16  Temp: 97.5 F (36.4 C)  TempSrc: Oral  SpO2: 99%  Weight: 227 lb 9.6 oz (103.2 kg)  Height: 5' 9"  (1.753 m)   Results for orders placed or performed in visit on 11/01/16  POCT glucose (manual entry)  Result Value Ref Range   POC Glucose 335 (A) 70 - 99 mg/dl      Assessment & Plan:    Sean Schaefer is a 51 y.o. male Type 2 diabetes mellitus with hyperglycemia, without long-term current use of insulin ([place]) - Plan: POCT glucose (manual entry), glipiZIDE (GLUCOTROL) 10 MG tablet Uncontrolled type 2 diabetes mellitus without complication, without long-term current use of insulin ([place])  - Uncontrolled diabetes last visit with medication nonadherence. Even back on glipizide 10 mg extended release once per day, blood sugar still in the 300s today.  - Change to glipizide 10 mg twice a day, work on diet and recheck in the next 3 weeks.   - May need additional agent, but he was able to have significant improvement in blood sugars in past with diet changes. Hypoglycemia precautions discussed with increasing glipizide.   Mixed hyperlipidemia  - Start Lipitor as planned, potential side effects discussed, recheck in 3 weeks.   Meds ordered this encounter  Medications  . glipiZIDE (GLUCOTROL) 10 MG tablet    Sig: Take 1 tablet (10 mg total) by mouth 2 (two) times daily before a meal.    Dispense:  60 tablet    Refill:  3   Patient Instructions   Start Lipitor as planned. If any new muscle aches, return to discuss the symptoms.  Check your blood sugar once per day at various times including fasting, and 2 hours after meals. Bring a copy of those readings in the next 2-3 weeks to see if other dosing changes are needed. Change glipizide to 48m twice per day, and work on diet (can schedule nutrition  visit).  If blood sugar still elevated at next visit, will likely add other medication for improved control. Watch for low blood sugars on new meds - see below for precautions on hypoglycemia.   Hypoglycemia Hypoglycemia occurs when the level of sugar (glucose) in the blood is too low. Glucose is a type of sugar that provides the body's main source of energy. Certain hormones (insulin and glucagon) control the level of glucose in the blood. Insulin lowers blood glucose, and glucagon increases blood glucose. Hypoglycemia can result from having too much insulin in the bloodstream, or from not eating enough food that contains glucose. Hypoglycemia can happen in people who do or do not have diabetes. It  can develop quickly, and it can be a medical emergency. What are the causes? Hypoglycemia occurs most often in people who have diabetes. If you have diabetes, hypoglycemia may be caused by:  Diabetes medicine.  Not eating enough, or not eating often enough.  Increased physical activity.  Drinking alcohol, especially when you have not eaten recently. If you do not have diabetes, hypoglycemia may be caused by:  A tumor in the pancreas. The pancreas is the organ that makes insulin.  Not eating enough, or not eating for long periods at a time (fasting).  Severe infection or illness that affects the liver, heart, or kidneys.  Certain medicines. You may also have reactive hypoglycemia. This condition causes hypoglycemia within 4 hours of eating a meal. This may occur after having stomach surgery. Sometimes, the cause of reactive hypoglycemia is not known. What increases the risk? Hypoglycemia is more likely to develop in:  People who have diabetes and take medicines to lower blood glucose.  People who abuse alcohol.  People who have a severe illness. What are the signs or symptoms? Hypoglycemia may not cause any symptoms. If you have symptoms, they may  include:  Hunger.  Anxiety.  Sweating and feeling clammy.  Confusion.  Dizziness or feeling light-headed.  Sleepiness.  Nausea.  Increased heart rate.  Headache.  Blurry vision.  Seizure.  Nightmares.  Tingling or numbness around the mouth, lips, or tongue.  A change in speech.  Decreased ability to concentrate.  A change in coordination.  Restless sleep.  Tremors or shakes.  Fainting.  Irritability. How is this diagnosed? Hypoglycemia is diagnosed with a blood test to measure your blood glucose level. This blood test is done while you are having symptoms. Your health care provider may also do a physical exam and review your medical history. If you do not have diabetes, other tests may be done to find the cause of your hypoglycemia. How is this treated? This condition can often be treated by immediately eating or drinking something that contains glucose, such as:  3-4 sugar tablets (glucose pills).  Glucose gel, 15-gram tube.  Fruit juice, 4 oz (120 mL).  Regular soda (not diet soda), 4 oz (120 mL).  Low-fat milk, 4 oz (120 mL).  Several pieces of hard candy.  Sugar or honey, 1 Tbsp. Treating Hypoglycemia If You Have Diabetes   If you are alert and able to swallow safely, follow the 15:15 rule:  Take 15 grams of a rapid-acting carbohydrate. Rapid-acting options include:  1 tube of glucose gel.  3 glucose pills.  6-8 pieces of hard candy.  4 oz (120 mL) of fruit juice.  4 oz (120 ml) of regular (not diet) soda.  Check your blood glucose 15 minutes after you take the carbohydrate.  If the repeat blood glucose level is still at or below 70 mg/dL (3.9 mmol/L), take 15 grams of a carbohydrate again.  If your blood glucose level does not increase above 70 mg/dL (3.9 mmol/L) after 3 tries, seek emergency medical care.  After your blood glucose level returns to normal, eat a meal or a snack within 1 hour. Treating Severe Hypoglycemia  Severe  hypoglycemia is when your blood glucose level is at or below 54 mg/dL (3 mmol/L). Severe hypoglycemia is an emergency. Do not wait to see if the symptoms will go away. Get medical help right away. Call your local emergency services (911 in the [place] Do not drive yourself to the hospital. If you have severe hypoglycemia  and you cannot eat or drink, you may need an injection of glucagon. A family member or close friend should learn how to check your blood glucose and how to give you a glucagon injection. Ask your health care provider if you need to have an emergency glucagon injection kit available. Severe hypoglycemia may need to be treated in a hospital. The treatment may include getting glucose through an IV tube. You may also need treatment for the cause of your hypoglycemia. Follow these instructions at home: General instructions   Avoid any diets that cause you to not eat enough food. Talk with your health care provider before you start any new diet.  Take over-the-counter and prescription medicines only as told by your health care provider.  Limit alcohol intake to no more than 1 drink per day for nonpregnant women and 2 drinks per day for men. One drink equals 12 oz of beer, 5 oz of wine, or 1 oz of hard liquor.  Keep all follow-up visits as told by your health care provider. This is important. If You Have Diabetes:    Make sure you know the symptoms of hypoglycemia.  Always have a rapid-acting carbohydrate snack with you to treat low blood sugar.  Follow your diabetes management plan, as told by your health care provider. Make sure you:  Take your medicines as directed.  Follow your exercise plan.  Follow your meal plan. Eat on time, and do not skip meals.  Check your blood glucose as often as directed. Make sure to check your blood glucose before and after exercise. If you exercise longer or in a different way than usual, check your blood glucose more often.  Follow your sick  day plan whenever you cannot eat or drink normally. Make this plan in advance with your health care provider.  Share your diabetes management plan with people in your workplace, school, and household.  Check your urine for ketones when you are ill and as told by your health care provider.  Carry a medical alert card or wear medical alert jewelry. If You Have Reactive Hypoglycemia or Low Blood Sugar From Other Causes:   Monitor your blood glucose as told by your health care provider.  Follow instructions from your health care provider about eating or drinking restrictions. Contact a health care provider if:  You have problems keeping your blood glucose in your target range.  You have frequent episodes of hypoglycemia. Get help right away if:  You continue to have hypoglycemia symptoms after eating or drinking something containing glucose.  Your blood glucose is at or below 54 mg/dL (3 mmol/L).  You have a seizure.  You faint. These symptoms may represent a serious problem that is an emergency. Do not wait to see if the symptoms will go away. Get medical help right away. Call your local emergency services (911 in the [place] Do not drive yourself to the hospital. This information is not intended to replace advice given to you by your health care provider. Make sure you discuss any questions you have with your health care provider. Document Released: 07/08/2005 Document Revised: 12/20/2015 Document Reviewed: 08/11/2015 Elsevier Interactive Patient Education  2017 [employer].     IF you received an x-ray today, you will receive an invoice from [facility]. Please contact [facility] at [phone] with questions or concerns regarding your invoice.   IF you received labwork today, you will receive an invoice from [place]. Please contact [facility] at [phone] with questions or concerns  regarding your invoice.   Our billing staff will not be able to assist you  with questions regarding bills from these companies.  You will be contacted with the lab results as soon as they are available. The fastest way to get your results is to activate your My Chart account. Instructions are located on the last page of this paperwork. If you have not heard from [place] regarding the results in 2 weeks, please contact this office.       I personally performed the services described in this documentation, which was scribed in my presence. The recorded information has been reviewed and considered for accuracy and completeness, addended by me as needed, and agree with information above.  Signed,   Merri Ray, MD Primary Care at [place].  11/01/16 10:06 AM

## 2016-11-07 ENCOUNTER — Ambulatory Visit (INDEPENDENT_AMBULATORY_CARE_PROVIDER_SITE_OTHER): Payer: 59 | Admitting: Urgent Care

## 2016-11-07 ENCOUNTER — Encounter: Payer: Self-pay | Admitting: Urgent Care

## 2016-11-07 VITALS — BP 125/77 | HR 88 | Temp 98.1°F | Ht 68.9 in | Wt 229.4 lb

## 2016-11-07 DIAGNOSIS — M1712 Unilateral primary osteoarthritis, left knee: Secondary | ICD-10-CM

## 2016-11-07 DIAGNOSIS — M25562 Pain in left knee: Secondary | ICD-10-CM

## 2016-11-07 MED ORDER — TRAMADOL HCL 50 MG PO TABS: 50.0000 mg | ORAL_TABLET | Freq: Three times a day (TID) | ORAL | 0 refills | Status: DC | PRN

## 2016-11-07 MED FILL — NAPROXEN SODIUM 550 MG TAB: 550 | 15 days supply | Qty: 30 | Fill #1

## 2016-11-07 MED FILL — traMADol HCL 50 MG TABS: 50 | 10 days supply | Qty: 30 | Fill #0

## 2016-11-07 NOTE — Progress Notes (Signed)
    MRN: [id number] DOB: [birth year]  Subjective:   Sean Schaefer is a 51 y.o. male presenting for follow up on left knee pain. Last OV for this was 10/25/2016. Imaging demonstrated degenerative changes of his left knee. This is consistent with his level of activity for most his life. Today, reports that he has had some improvement in his left knee pain. He has been using Anaprox consistently. However, he still has intermittent sharp pain of his left knee associated with strenuous activity. Admits swelling is improved. Denies fever, redness, swelling, knee buckling, popping. He is not interested in stronger interventions for now such as steroid course or knee injections. 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   Sean Schaefer has a current medication list which includes the following prescription(s): amlodipine, atorvastatin, glipizide, naproxen sodium, and ramipril. Also has No Known Allergies.  Sean Schaefer  has a past medical history of Diabetes mellitus without complication ([place]); Hypertension; and Ulcerative colitis ([place]). Also  has a past surgical history that includes Perianal fistula repair (2012) and Colon surgery.  Objective:   Vitals: BP 125/77 (BP Location: Right Arm, Patient Position: Sitting, Cuff Size: Large)   Pulse 88   Temp 98.1 F (36.7 C) (Oral)   Ht 5' 8.9" (1.75 m)   Wt 229 lb 6.4 oz (104.1 kg)   SpO2 99%   BMI 33.98 kg/m   Physical Exam  Constitutional: He is oriented to person, place, and time. He appears well-developed and well-nourished.  Cardiovascular: Normal rate.   Pulmonary/Chest: Effort normal.  Musculoskeletal:       Left knee: He exhibits decreased range of motion (extension). He exhibits no swelling, no effusion, no ecchymosis, no deformity, no laceration, no erythema, normal alignment and normal patellar mobility. Tenderness  found. Lateral joint line tenderness noted. No medial joint line and no patellar tendon tenderness noted.  Neurological: He is alert and oriented to person, place, and time.  Skin: Skin is warm and dry.  Psychiatric: He has a normal mood and affect.   Assessment and Plan :   1. Acute pain of left knee 2. Osteoarthritis of left knee, unspecified osteoarthritis type - Recommended patient schedule APAP with Anaprox when he starts to have knee pain. Use Tramadol for breakthrough pain. Will refer to Ortho for consult. Patient provided with work restrictions for his knee. Follow up after ortho consult. - Ambulatory referral to [place], PA-C [facility] [phone] 11/07/2016 8:27 AM

## 2016-11-07 NOTE — Patient Instructions (Addendum)
You may take 547m Tylenol every 6 hours with Anaprox every 12 hours for pain and inflammation of your knee. If this does not control your pain, then take Tramadol every 8 hours as needed.   Knee Pain, Adult Knee pain in adults is common. It can be caused by many things, including:  Arthritis.  A fluid-filled sac (cyst) or growth in your knee.  An infection in your knee.  An injury that will not heal.  Damage, swelling, or irritation of the tissues that support your knee. Knee pain is usually not a sign of a serious problem. The pain may go away on its own with time and rest. If it does not, a health care provider may order tests to find the cause of the pain. These may include:  Imaging tests, such as an X-ray, MRI, or ultrasound.  Joint aspiration. In this test, fluid is removed from the knee.  Arthroscopy. In this test, a lighted tube is inserted into knee and an image is projected onto a TV screen.  A biopsy. In this test, a sample of tissue is removed from the body and studied under a microscope. Follow these instructions at home: Pay attention to any changes in your symptoms. Take these actions to relieve your pain. Activity   Rest your knee.  Do not do things that cause pain or make pain worse.  Avoid high-impact activities or exercises, such as running, jumping rope, or doing jumping jacks. General instructions   Take over-the-counter and prescription medicines only as told by your health care provider.  Raise (elevate) your knee above the level of your heart when you are sitting or lying down.  Sleep with a pillow under your knee.  If directed, apply ice to the knee:  Put ice in a plastic bag.  Place a towel between your skin and the bag.  Leave the ice on for 20 minutes, 2-3 times a day.  Ask your health care provider if you should wear an elastic knee support.  Lose weight if you are overweight. Extra weight can put pressure on your knee.  Do not use  any products that contain nicotine or tobacco, such as cigarettes and e-cigarettes. Smoking may slow the healing of any bone and joint problems that you may have. If you need help quitting, ask your health care provider. Contact a health care provider if:  Your knee pain continues, changes, or gets worse.  You have a fever along with knee pain.  Your knee buckles or locks up.  Your knee swells, and the swelling becomes worse. Get help right away if:  Your knee feels warm to the touch.  You cannot move your knee.  You have severe pain in your knee.  You have chest pain.  You have trouble breathing. Summary  Knee pain in adults is common. It can be caused by many things, including, arthritis, infection, cysts, or injury.  Knee pain is usually not a sign of a serious problem, but if it does not go away, a health care provider may perform tests to know the cause of the pain.  Pay attention to any changes in your symptoms. Relieve your pain with rest, medicines, light activity, and use of ice.  Get help if your pain continues or becomes very severe, or if your knee buckles or locks up, or if you have chest pain or trouble breathing. This information is not intended to replace advice given to you by your health care provider. Make sure  you discuss any questions you have with your health care provider. Document Released: 05/05/2007 Document Revised: 06/28/2016 Document Reviewed: 06/28/2016 Elsevier Interactive Patient Education  2017 [employer].     IF you received an x-ray today, you will receive an invoice from [facility] Rad[facility]. Please contact [facility] Radiology at [phone] with questions or concerns regarding your invoice.   IF you received labwork today, you will receive an invoice from [place]. Please contact [facility] at [phone] with questions or concerns regarding your invoice.   Our billing staff will not be able to assist you with questions regarding bills  from these companies.  You will be contacted with the lab results as soon as they are available. The fastest way to get your results is to activate your My Chart account. Instructions are located on the last page of this paperwork. If you have not heard from [place] regarding the results in 2 weeks, please contact this office.

## 2016-11-28 ENCOUNTER — Ambulatory Visit: Payer: 59 | Admitting: Family Medicine

## 2016-12-02 ENCOUNTER — Encounter: Payer: Self-pay | Admitting: Family Medicine

## 2016-12-02 ENCOUNTER — Ambulatory Visit (INDEPENDENT_AMBULATORY_CARE_PROVIDER_SITE_OTHER): Payer: 59 | Admitting: Family Medicine

## 2016-12-02 VITALS — BP 135/84 | HR 82 | Temp 98.1°F | Resp 16 | Ht 69.0 in | Wt 229.6 lb

## 2016-12-02 DIAGNOSIS — E1165 Type 2 diabetes mellitus with hyperglycemia: Secondary | ICD-10-CM

## 2016-12-02 DIAGNOSIS — E785 Hyperlipidemia, unspecified: Secondary | ICD-10-CM

## 2016-12-02 MED ORDER — DAPAGLIFLOZIN PROPANEDIOL 5 MG PO TABS: 5.0000 mg | ORAL_TABLET | Freq: Every day | ORAL | 2 refills | Status: DC

## 2016-12-02 NOTE — Progress Notes (Signed)
By signing my name below, I, Mesha Guinyard, attest that this documentation has been prepared under the direction and in the presence of Merri Ray, MD.  Electronically Signed: Verlee Monte, Medical Scribe. 12/02/16. 9:19 AM.  Subjective:    Patient ID: Sean Schaefer, male    DOB: 1965-09-30, 51 y.o.   MRN: [id number]  HPI Chief Complaint  Patient presents with  . Medication Refill    Atorvastatin  10 mg, Glipizide 10 mg    HPI Comments: Sean Schaefer is a 51 y.o. male who presents to Primary Care at [facility] for medication refill.  Pt is not fasting.  DM: Takes glipizide 10 mg BID. Uncontrolled DM at last visit as he had been off of medication. Here to recheck on glipizide alone. Pt is complaint with glipizide, and his blood sugar has been in the 200s. This morning his fasting blood sugar was 215. Repots feeling dizzy after not eating for 3 hours and cutting the grass. Pt hasn't been exercising due to the limitations on his left leg. Denies experiencing any negative side effects from glipizide and hypoglycemic episodes.  Lab Results  Component Value Date   HGBA1C 10.9 (H) 10/21/2016   HLD: Lipitor 10 mg QD was started 10/22/16 Pt has been compliant with lipitor. Denies experiencing negative side effects with his medication. Lab Results  Component Value Date   CHOL 228 (H) 10/22/2016   HDL 38 (L) 10/22/2016   LDLCALC Comment 10/22/2016   TRIG 484 (H) 10/22/2016   CHOLHDL 6.0 (H) 10/22/2016   Lab Results  Component Value Date   ALT 18 10/21/2016   AST 17 10/21/2016   ALKPHOS 63 10/21/2016   BILITOT 0.6 10/21/2016   Patient Active Problem List   Diagnosis Date Noted  . Diabetes ([place]) 12/26/2014  . 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 Crohn's disease ([place]) 12/18/2013   Past Medical History:  Diagnosis Date  . Diabetes mellitus without complication ([place])   . Hypertension   . Ulcerative colitis [facility])    Past Surgical History:  Procedure Laterality Date   . COLON SURGERY    . Perianal fistula repair  2012   No Known Allergies Prior to Admission medications   Medication Sig Start Date End Date Taking? Authorizing Provider  amLODipine (NORVASC) 10 MG tablet Take 1 tablet (10 mg total) by mouth daily. Reported on 10/12/2015 10/21/16  Yes Wendie Agreste, MD  atorvastatin (LIPITOR) 10 MG tablet Take 1 tablet (10 mg total) by mouth daily. 10/30/16  Yes Wendie Agreste, MD  glipiZIDE (GLUCOTROL) 10 MG tablet Take 1 tablet (10 mg total) by mouth 2 (two) times daily before a meal. 11/01/16  Yes Wendie Agreste, MD  ramipril (ALTACE) 10 MG capsule Take 1 capsule (10 mg total) by mouth daily. Reported on 10/12/2015 10/21/16  Yes Wendie Agreste, MD  naproxen sodium (ANAPROX DS) 550 MG tablet Take 1 tablet (550 mg total) by mouth 2 (two) times daily with a meal. Patient not taking: Reported on 12/02/2016 10/25/16   Jaynee Eagles, PA-C  traMADol (ULTRAM) 50 MG tablet Take 1 tablet (50 mg total) by mouth every 8 (eight) hours as needed. Patient not taking: Reported on 12/02/2016 11/07/16   Jaynee Eagles, PA-C   Social History   Social History  . Marital status: Married    Spouse name: N/A  . Number of children: N/A  . Years of education: N/A   Occupational History  . Not on file.  Social History Main Topics  . Smoking status: Never Smoker  . Smokeless tobacco: Never Used  . Alcohol use No  . Drug use: No  . Sexual activity: Yes   Other Topics Concern  . Not on file   Social History Narrative  . No narrative on file   Review of Systems  Musculoskeletal: Negative for myalgias.  Neurological: Positive for dizziness.   Objective:  Physical Exam  Constitutional: He is oriented to person, place, and time. He appears well-developed and well-nourished.  HENT:  Head: Normocephalic and atraumatic.  Eyes: EOM are normal. Pupils are equal, round, and reactive to light.  Neck: No JVD present. Carotid bruit is not present.  Cardiovascular: Normal  rate, regular rhythm and normal heart sounds.  Exam reveals no gallop and no friction rub.   No murmur heard. Pulmonary/Chest: Effort normal and breath sounds normal. No respiratory distress. He has no wheezes. He has no rales.  Musculoskeletal: He exhibits no edema.  Neurological: He is alert and oriented to person, place, and time.  Skin: Skin is warm and dry.  Psychiatric: He has a normal mood and affect.  Vitals reviewed.   Vitals:   12/02/16 0832  BP: 135/84  Pulse: 82  Resp: 16  Temp: 98.1 F (36.7 C)  TempSrc: Oral  SpO2: 98%  Weight: 229 lb 9.6 oz (104.1 kg)  Height: 5' 9"  (1.753 m)  Body mass index is 33.91 kg/m. Assessment & Plan:   Sean Schaefer is a 51 y.o. male Hyperlipidemia, unspecified hyperlipidemia type - Plan: Comprehensive metabolic panel, Lipid panel, CANCELED: Comprehensive metabolic panel, CANCELED: Lipid panel  - Not fasting. Fasting labs entered for draw later this week or next week. Tolerating Lipitor, continue same dose.  Type 2 diabetes mellitus with hyperglycemia, without long-term current use of insulin (HCC) - Plan: dapagliflozin propanediol (FARXIGA) 5 MG TABS tablet  - Improved, but still with elevated readings in the 200s. Start [place]. Potential side effects discussed, recheck in 2 months with A1c. Continue glipizide at current doses, hypoglycemic precautions discussed.  Meds ordered this encounter  Medications  . dapagliflozin propanediol (FARXIGA) 5 MG TABS tablet    Sig: Take 5 mg by mouth daily.    Dispense:  30 tablet    Refill:  2   Patient Instructions   Start Farxiga once per day, continue glipizide same dose. Return for fasting labs within the next week if possible. Follow-up with me after fourth of July for repeat diabetes testing. If you continue to have blood sugars in the 200s, may need to see you sooner.    IF you received an x-ray today, you will receive an invoice from [facility] Radiology. Please contact [facility]  Radiology at [phone] with questions or concerns regarding your invoice.   IF you received labwork today, you will receive an invoice from [place]. Please contact [facility] at [phone] with questions or concerns regarding your invoice.   Our billing staff will not be able to assist you with questions regarding bills from these companies.  You will be contacted with the lab results as soon as they are available. The fastest way to get your results is to activate your My Chart account. Instructions are located on the last page of this paperwork. If you have not heard from [place] regarding the results in 2 weeks, please contact this office.      I personally performed the services described in this documentation, which was scribed in my presence. The recorded information has been  reviewed and considered for accuracy and completeness, addended by me as needed, and agree with information above.  Signed,   Merri Ray, MD Primary Care at [place].  12/02/16 9:33 AM

## 2016-12-02 NOTE — Patient Instructions (Addendum)
Start [place] once per day, continue glipizide same dose. Return for fasting labs within the next week if possible. Follow-up with me after fourth of July for repeat diabetes testing. If you continue to have blood sugars in the 200s, may need to see you sooner.    IF you received an x-ray today, you will receive an invoice from [facility]iology. Please contact [facility] Radiology at [phone] with questions or concerns regarding your invoice.   IF you received labwork today, you will receive an invoice from [place]. Please contact [facility] at [phone] with questions or concerns regarding your invoice.   Our billing staff will not be able to assist you with questions regarding bills from these companies.  You will be contacted with the lab results as soon as they are available. The fastest way to get your results is to activate your My Chart account. Instructions are located on the last page of this paperwork. If you have not heard from [place] regarding the results in 2 weeks, please contact this office.

## 2016-12-05 ENCOUNTER — Ambulatory Visit (INDEPENDENT_AMBULATORY_CARE_PROVIDER_SITE_OTHER): Payer: 59 | Admitting: Orthopaedic Surgery

## 2016-12-06 ENCOUNTER — Other Ambulatory Visit (INDEPENDENT_AMBULATORY_CARE_PROVIDER_SITE_OTHER): Payer: 59 | Admitting: Physician Assistant

## 2016-12-06 DIAGNOSIS — E785 Hyperlipidemia, unspecified: Secondary | ICD-10-CM

## 2016-12-06 MED FILL — ATORVASTATIN 10 MG TABLET: 10 | 30 days supply | Qty: 30 | Fill #1

## 2016-12-06 MED FILL — glipiZIDE 10 MG TABS: 10 | 30 days supply | Qty: 60 | Fill #1

## 2016-12-07 LAB — COMPREHENSIVE METABOLIC PANEL
A/G RATIO: 1 — AB (ref 1.2–2.2)
ALBUMIN: 3.9 g/dL (ref 3.5–5.5)
ALK PHOS: 59 IU/L (ref 39–117)
ALT: 19 IU/L (ref 0–44)
AST: 20 IU/L (ref 0–40)
BILIRUBIN TOTAL: 0.8 mg/dL (ref 0.0–1.2)
BUN / CREAT RATIO: 15 (ref 9–20)
BUN: 19 mg/dL (ref 6–24)
CHLORIDE: 95 mmol/L — AB (ref 96–106)
CO2: 21 mmol/L (ref 18–29)
Calcium: 9.1 mg/dL (ref 8.7–10.2)
Creatinine, Ser: 1.27 mg/dL (ref 0.76–1.27)
GFR calc Af Amer: 77 mL/min/{1.73_m2} (ref >59)
GFR calc non Af Amer: 66 mL/min/{1.73_m2} (ref >59)
GLUCOSE: 198 mg/dL — AB (ref 65–99)
Globulin, Total: 3.8 g/dL (ref 1.5–4.5)
POTASSIUM: 4.5 mmol/L (ref 3.5–5.2)
Sodium: 135 mmol/L (ref 134–144)
Total Protein: 7.7 g/dL (ref 6.0–8.5)

## 2016-12-07 LAB — LIPID PANEL
CHOLESTEROL TOTAL: 152 mg/dL (ref 100–199)
Chol/HDL Ratio: 3.7 ratio (ref 0.0–5.0)
HDL: 41 mg/dL (ref >39)
LDL Calculated: 78 mg/dL (ref 0–99)
Triglycerides: 165 mg/dL — ABNORMAL HIGH (ref 0–149)
VLDL CHOLESTEROL CAL: 33 mg/dL (ref 5–40)

## 2016-12-13 DIAGNOSIS — E119 Type 2 diabetes mellitus without complications: Secondary | ICD-10-CM | POA: Diagnosis not present

## 2016-12-18 ENCOUNTER — Ambulatory Visit (INDEPENDENT_AMBULATORY_CARE_PROVIDER_SITE_OTHER): Payer: 59 | Admitting: Physician Assistant

## 2016-12-18 DIAGNOSIS — M2392 Unspecified internal derangement of left knee: Secondary | ICD-10-CM | POA: Insufficient documentation

## 2016-12-18 MED ORDER — DICLOFENAC SODIUM 1 % TD GEL: 2.0000 g | Freq: Four times a day (QID) | TRANSDERMAL | 2 refills | Status: DC

## 2016-12-18 NOTE — Progress Notes (Signed)
Office Visit Note   Patient: Sean Schaefer           Date of Birth: April 23, 1966           MRN: [id number] Visit Date: 12/18/2016              Requested by: Sean Eagles, PA-C [place], [place] [address] PCP: Sean Agreste, MD   Assessment & Plan: Visit Diagnoses:  1. Patellar malalignment syndrome, left     Plan:Quad strengthening exercises shown , knee friendly exercises discussed. Topical diclofenac. Topical NSAIDs felt to be a better choice due to patient's history possible Crohn's versus ulcerative colitis. Also he is given some literature on Monovisc one injection which he may benefit from a supplemental injection in the knee if pain persists despite conservative treatment. Follow up as needed or if he develops any mechanical symptoms.   Follow-Up Instructions: Return if symptoms worsen or fail to improve.   Orders:  No orders of the defined types were placed in this encounter.  Meds ordered this encounter  Medications  . diclofenac sodium (VOLTAREN) 1 % GEL    Sig: Apply 2-4 g topically 4 (four) times daily.    Dispense:  2 Tube    Refill:  2      Procedures: No procedures performed   Clinical Data: No additional findings.   Subjective: Left knee pain.  HPI Sean Schaefer is a 51 year old male is being seen today for first time for left knee pain. No ongoing for over a month may be 2 months at the most. He states he had pain after mowing on an incline and felt that he may have twisted the left knee. At some swelling and pain over the anterior lateral aspect of the knee. Tried an open patellar knee brace which helps some. Other than that he's tried relative rest. Does have some increased pain in the patellar region the left knee with prolonged sitting. He also notes that he has to take stairs slowly especially going downstairs. As having no true mechanical symptoms of the left knee. He is diabetic with a reported hemoglobin A1c of 10.0 states his glucose was  over 200 this morning. He also has a history of ulcerative colitis or Crohn's disease as had some portion of his colon resected . States that he's been told by the [facility] that he has ulcerative colitis but by his local gastroenterologist that he has Crohn's disease. Radiographs on with [facility] system dated 10/25/2016 left knee complete: Personally reviewed films shows no acute fracture. Bony prominence at the insertion of the patellar tendon. Slight lateralization of the patella particularly on this sunrise view. Otherwise the knee joint is well-preserved.  Review of Systems Denies mechanical symptoms of either knee. No fevers chills shortness breath chest pain. Otherwise please see history of present illness.  Objective: Vital Signs: There were no vitals taken for this visit.  Physical Exam  Constitutional: He is oriented to person, place, and time. He appears well-developed and well-nourished. No distress.  Pulmonary/Chest: Effort normal.  Neurological: He is alert and oriented to person, place, and time.  Psychiatric: He has a normal mood and affect. His behavior is normal.    Ortho Exam Bilateral knees full range of motion. No instability valgus varus stressing either knee. Anterior drawer is negative bilateral McMurray's is negative bilaterally. Left knee with slight patellar crepitus with passive range of motion. Slight pain with Phillips Odor testing left knee Phillips Odor negative on the  right. No effusion abnormal warmth erythema or edema of either knee. Tenderness along the lateral joint line left knee only left knee otherwise nontender throughout the right knee nontender throughout. Specialty Comments:  No specialty comments available.  Imaging: No results found.   PMFS History: Patient Active Problem List   Diagnosis Date Noted  . 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 Diabetes ([place]) 12/26/2014  . 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 Crohn's  disease ([place]) 12/18/2013   Past Medical History:  Diagnosis Date  . Diabetes mellitus without complication ([place])   . Hypertension   . Ulcerative colitis ([place])     Family History  Problem Relation Age of Onset  . Hypertension Mother   . Cancer Mother   . Hypertension Father   . Diabetes Paternal Grandmother     Past Surgical History:  Procedure Laterality Date  . COLON SURGERY    . Perianal fistula repair  2012   Social History   Occupational History  . Not on file.   Social History Main Topics  . Smoking status: Never Smoker  . Smokeless tobacco: Never Used  . Alcohol use No  . Drug use: No  . Sexual activity: Yes

## 2016-12-25 ENCOUNTER — Telehealth: Payer: Self-pay

## 2016-12-25 NOTE — Telephone Encounter (Signed)
PA Started Farxiga 5 mg Awaiting Response Follow up ID - [id number]

## 2016-12-31 NOTE — Telephone Encounter (Signed)
Prior auth form faxed over for [place] from [employer]. Please sign. Form placed in box

## 2017-01-13 NOTE — Telephone Encounter (Signed)
Formed faxed to [employer]

## 2017-01-23 MED FILL — glipiZIDE 10 MG TABS: 10 | 30 days supply | Qty: 60 | Fill #2

## 2017-01-23 MED FILL — ATORVASTATIN 10 MG TABLET: 10 | 30 days supply | Qty: 30 | Fill #2

## 2017-02-05 MED FILL — RAMIPRIL 10 MG CAPSULE: 10 | 90 days supply | Qty: 90 | Fill #1

## 2017-02-06 MED FILL — AMLODIPINE BESYLATE 10 MG T: 10 | 90 days supply | Qty: 90 | Fill #1

## 2017-02-28 ENCOUNTER — Other Ambulatory Visit: Payer: Self-pay | Admitting: Family Medicine

## 2017-03-03 MED FILL — ATORVASTATIN 10 MG TABLET: 10 | 30 days supply | Qty: 30 | Fill #0

## 2017-03-13 ENCOUNTER — Other Ambulatory Visit: Payer: Self-pay

## 2017-03-13 NOTE — Patient Outreach (Signed)
[place] [facility]) Care Management  03/13/2017  [name] 06/22/1966 [id number]   Phone call to schedule appointment. Member has not been seen by [facility] since 03/28/16.  No answer and unable to leave message.  Plan to send appointment request letter and will close case if no response to letter. Peter Garter RN, [facility] Care Management Coordinator-Link to [place] Management [phone]

## 2017-04-18 MED FILL — glipiZIDE 10 MG TABS: 10 | 30 days supply | Qty: 60 | Fill #3

## 2017-04-18 MED FILL — ATORVASTATIN 10 MG TABLET: 10 | 30 days supply | Qty: 30 | Fill #1

## 2017-06-16 ENCOUNTER — Other Ambulatory Visit: Payer: Self-pay

## 2017-06-16 NOTE — Patient Outreach (Signed)
[place] [facility]) Care Management  06/16/2017  Sean Schaefer 1966/04/06 [id number]   Telephone call to inform of closing of Link to Wellness case and transition to [employer].  Instructed that he will be transitioned to [employer] in 2019 for disease management and the Link to Wellness program will be closed. Instructed that he will be contacted by [employer] by phone in January 2019.   Instructed that he will continue to receive the pharmacy benefit.    [employer] will contact member in January to continue diabetes disease management. Case closed for Link to Wellness as member will be enrolled in an external program. Member and provider to be sent letter on transition to [place] RN, [facility] Care Management Coordinator-Link to [place] Management [phone]

## 2017-08-29 MED FILL — ATORVASTATIN 10 MG TABLET: 10 | 30 days supply | Qty: 30 | Fill #2

## 2017-10-20 ENCOUNTER — Encounter: Payer: Self-pay | Admitting: Physician Assistant

## 2017-11-10 MED FILL — HYDROCOD-IBU 7.5-200 TAB: 7.5-200 | 3 days supply | Qty: 20 | Fill #0

## 2017-11-18 MED FILL — HYDROCOD-IBU 7.5-200 TAB: 7.5-200 | 4 days supply | Qty: 20 | Fill #0

## 2017-12-17 ENCOUNTER — Encounter: Payer: Self-pay | Admitting: Physician Assistant

## 2017-12-17 ENCOUNTER — Other Ambulatory Visit: Payer: Self-pay

## 2017-12-17 ENCOUNTER — Ambulatory Visit (INDEPENDENT_AMBULATORY_CARE_PROVIDER_SITE_OTHER): Payer: No Typology Code available for payment source | Admitting: Physician Assistant

## 2017-12-17 VITALS — BP 104/74 | HR 101 | Temp 98.2°F | Resp 16 | Ht 69.0 in | Wt 220.0 lb

## 2017-12-17 DIAGNOSIS — Z23 Encounter for immunization: Secondary | ICD-10-CM | POA: Diagnosis not present

## 2017-12-17 DIAGNOSIS — E1165 Type 2 diabetes mellitus with hyperglycemia: Secondary | ICD-10-CM

## 2017-12-17 DIAGNOSIS — Z1321 Encounter for screening for nutritional disorder: Secondary | ICD-10-CM

## 2017-12-17 DIAGNOSIS — Z130 Encounter for screening for diseases of the blood and blood-forming organs and certain disorders involving the immune mechanism: Secondary | ICD-10-CM | POA: Diagnosis not present

## 2017-12-17 DIAGNOSIS — Z13228 Encounter for screening for other metabolic disorders: Secondary | ICD-10-CM | POA: Diagnosis not present

## 2017-12-17 DIAGNOSIS — Z0000 Encounter for general adult medical examination without abnormal findings: Secondary | ICD-10-CM | POA: Diagnosis not present

## 2017-12-17 DIAGNOSIS — Z1329 Encounter for screening for other suspected endocrine disorder: Secondary | ICD-10-CM | POA: Diagnosis not present

## 2017-12-17 LAB — GLUCOSE, POCT (MANUAL RESULT ENTRY): POC Glucose: >444 mg/dl (ref 70–99)

## 2017-12-17 MED ORDER — DAPAGLIFLOZIN PROPANEDIOL 5 MG PO TABS: 5.0000 mg | ORAL_TABLET | Freq: Every day | ORAL | 3 refills | Status: DC

## 2017-12-17 MED ORDER — INSULIN DETEMIR 100 UNIT/ML FLEXPEN: 10.0000 [IU] | PEN_INJECTOR | Freq: Every day | SUBCUTANEOUS | 11 refills | Status: DC

## 2017-12-17 MED ORDER — INSULIN PEN NEEDLE 30G X 8 MM MISC: 1.0000 | Freq: Every day | Status: AC

## 2017-12-17 MED FILL — LEVEMIR FLEXTOUCH 100 UNITS: 100 | 30 days supply | Qty: 3 | Fill #0

## 2017-12-17 MED FILL — FARXIGA 5 MG TABLET: 5 | 30 days supply | Qty: 30 | Fill #0

## 2017-12-17 NOTE — Patient Instructions (Addendum)
Come back in 2 weeks for BP and diabetes review. I will refer you to Dr. Loanne Drilling so he can help you further.  I will titrate your meds until then.     IF you received an x-ray today, you will receive an invoice from [facility]iology. Please contact [facility] Radiology at [phone] with questions or concerns regarding your invoice.   IF you received labwork today, you will receive an invoice from [place]. Please contact [facility] at [phone] with questions or concerns regarding your invoice.   Our billing staff will not be able to assist you with questions regarding bills from these companies.  You will be contacted with the lab results as soon as they are available. The fastest way to get your results is to activate your My Chart account. Instructions are located on the last page of this paperwork. If you have not heard from [place] regarding the results in 2 weeks, please contact this office.

## 2017-12-17 NOTE — Progress Notes (Signed)
12/17/2017 10:02 AM   DOB: 13-Aug-1965 / MRN: [id number]  SUBJECTIVE:  Sean Schaefer is a 52 y.o. male presenting for diabetes recheck.   He has No Known Allergies.   He  has a past medical history of Diabetes mellitus without complication ([place]), Hypertension, and Ulcerative colitis ([place]).    He  reports that he has never smoked. He has never used smokeless tobacco. He reports that he does not drink alcohol or use drugs. He  reports that he currently engages in sexual activity. The patient  has a past surgical history that includes Perianal fistula repair (2012) and Colon surgery.  His family history includes Cancer in his mother; Diabetes in his paternal grandmother; Hypertension in his father and mother.  ROS  The problem list and medications were reviewed and updated by myself where necessary and exist elsewhere in the encounter.   OBJECTIVE:  BP 104/74 (BP Location: Right Arm, Patient Position: Sitting, Cuff Size: Normal)   Pulse (!) 101   Temp 98.2 F (36.8 C) (Oral)   Resp 16   Ht 5' 9"  (1.753 m)   Wt 220 lb (99.8 kg)   SpO2 98%   BMI 32.49 kg/m   Wt Readings from Last 3 Encounters:  12/17/17 220 lb (99.8 kg)  12/02/16 229 lb 9.6 oz (104.1 kg)  11/07/16 229 lb 6.4 oz (104.1 kg)   Temp Readings from Last 3 Encounters:  12/17/17 98.2 F (36.8 C) (Oral)  12/02/16 98.1 F (36.7 C) (Oral)  11/07/16 98.1 F (36.7 C) (Oral)   BP Readings from Last 3 Encounters:  12/17/17 104/74  12/02/16 135/84  11/07/16 125/77   Pulse Readings from Last 3 Encounters:  12/17/17 (!) 101  12/02/16 82  11/07/16 88     Physical Exam  Lab Results  Component Value Date   WBC 6.8 12/16/2013   HGB 15.1 12/16/2013   HCT 43.0 12/16/2013   MCV 84.6 12/16/2013   PLT 201 12/16/2013    Lab Results  Component Value Date   CREATININE 1.27 12/06/2016   BUN 19 12/06/2016   NA 135 12/06/2016   K 4.5 12/06/2016   CL 95 (L) 12/06/2016   CO2 21 12/06/2016    Lab Results    Component Value Date   ALT 19 12/06/2016   AST 20 12/06/2016   ALKPHOS 59 12/06/2016   BILITOT 0.8 12/06/2016     Lab Results  Component Value Date   HGBA1C 10.9 (H) 10/21/2016    Lab Results  Component Value Date   CHOL 152 12/06/2016   HDL 41 12/06/2016   LDLCALC 78 12/06/2016   TRIG 165 (H) 12/06/2016   CHOLHDL 3.7 12/06/2016     No results found for this or any previous visit (from the past 72 hour(s)).  No results found.  ASSESSMENT AND PLAN:  [name] was seen today for annual exam.  Diagnoses and all orders for this visit:  Annual physical exam  Screening for endocrine, nutritional, metabolic and immunity disorder -     Lipid Panel -     PSA -     Hemoglobin A1c -     Comprehensive metabolic panel -     CBC -     Microalbumin/Creatinine Ratio, Urine -     POCT glucose (manual entry)    The patient is advised to call or return to clinic if he does not see an improvement in symptoms, or to seek the care of the closest emergency department if  he worsens with the above plan.   Philis Fendt, MHS, PA-C Primary Care at [facility] 12/17/2017 10:02 AM

## 2017-12-17 NOTE — Progress Notes (Signed)
12/17/2017 12:25 PM   DOB: [birth year] / MRN: [id number]  SUBJECTIVE:  Sean Schaefer is a 52 y.o. male presenting for annual exam.  Patient states he needs this for a insurance physical.  He would like his PSA tested today and is also requesting testing for testosterone.  He denies any chest pain, shortness of breath, DOE, leg swelling, headache, vision changes.  He has a history of diabetes and has been off of all of his medications for the last 4 months.  He tells me that his body becomes accustomed to medications and they eventually stopped working.  He cannot take  metformin due to a history of colitis.  He is not opposed to seeing a endocrinologist.  He is not opposed to starting insulin today if he has to.  Patient was taking ACE along with statin prior to stopping medications.  He does not complain of stocking glove paresthesia polyuria or polydipsia.  He tells me that he feels well today and has been losing weight and seeing improvements in his blood pressure since stopping all of his diabetic medication.  He tells me "I am being going to town on my diet," and states he has been eating lots of ice cream.  He is willing to stop eating ice cream.  Fortunately he is a non-smoker.   He has No Known Allergies.   He  has a past medical history of Diabetes mellitus without complication ([place]), Hypertension, and Ulcerative colitis ([place]).    He  reports that he has never smoked. He has never used smokeless tobacco. He reports that he does not drink alcohol or use drugs. He  reports that he currently engages in sexual activity. The patient  has a past surgical history that includes Perianal fistula repair (2012) and Colon surgery.  His family history includes Cancer in his mother; Diabetes in his paternal grandmother; Hypertension in his father and mother.  Review of Systems  Constitutional: Negative for chills and fever.  Eyes: Negative.   Respiratory: Negative for cough and shortness of breath.    Cardiovascular: Negative for chest pain and leg swelling.  Genitourinary: Negative for dysuria, flank pain, frequency, hematuria and urgency.  Musculoskeletal: Negative for myalgias.  Skin: Negative for itching and rash.  Neurological: Negative for dizziness, tingling, tremors, sensory change, speech change, focal weakness, seizures, loss of consciousness, weakness and headaches.  Psychiatric/Behavioral: Negative for hallucinations.    The problem list and medications were reviewed and updated by myself where necessary and exist elsewhere in the encounter.   OBJECTIVE:  BP 104/74 (BP Location: Right Arm, Patient Position: Sitting, Cuff Size: Normal)   Pulse (!) 101   Temp 98.2 F (36.8 C) (Oral)   Resp 16   Ht 5' 9"  (1.753 m)   Wt 220 lb (99.8 kg)   SpO2 98%   BMI 32.49 kg/m   Physical Exam  Constitutional: He is oriented to person, place, and time. He appears well-developed and well-nourished. He is active.  Non-toxic appearance. He does not appear ill. No distress.  HENT:  Head: Normocephalic and atraumatic.  Mouth/Throat: No oropharyngeal exudate.  Eyes: Pupils are equal, round, and reactive to light. Conjunctivae and EOM are normal.  Cardiovascular: Normal rate, regular rhythm, S1 normal, S2 normal, normal heart sounds, intact distal pulses and normal pulses. Exam reveals no gallop and no friction rub.  No murmur heard. Pulmonary/Chest: Effort normal. No stridor. No tachypnea. No respiratory distress. He has no wheezes. He has no rhonchi. He  has no rales. He exhibits no tenderness.  Abdominal: Normal appearance. He exhibits no distension. There is no hepatosplenomegaly, splenomegaly or hepatomegaly. There is no rigidity, no rebound and no CVA tenderness.  Musculoskeletal: Normal range of motion. He exhibits no edema, tenderness or deformity.  Neurological: He is alert and oriented to person, place, and time. He displays normal reflexes. No cranial nerve deficit or sensory  deficit. He exhibits normal muscle tone. Coordination normal.  Speech is clear, linear, logical.  He is negative for confusion.  Skin: Skin is warm and dry. No rash noted. He is not diaphoretic. No erythema. No pallor.  Psychiatric: He has a normal mood and affect.  Nursing note and vitals reviewed.   Results for orders placed or performed in visit on 12/17/17 (from the past 72 hour(s))  POCT glucose (manual entry)     Status: None   Collection Time: 12/17/17 10:17 AM  Result Value Ref Range   POC Glucose >444 70 - 99 mg/dl   Lab Results  Component Value Date   K 4.5 12/06/2016    No results found.  ASSESSMENT AND PLAN:  Sean Schaefer was seen today for annual exam.  Diagnoses and all orders for this visit:  Annual physical exam  Screening for endocrine, nutritional, metabolic and immunity disorder -     Lipid Panel -     PSA -     Hemoglobin A1c -     Comprehensive metabolic panel -     CBC -     Microalbumin/Creatinine Ratio, Urine -     POCT glucose (manual entry) -     TSH -     Testosterone  Need for prophylactic vaccination against Streptococcus pneumoniae (pneumococcus) -     Pneumococcal polysaccharide vaccine 23-valent greater than or equal to 2yo subcutaneous/IM  Uncontrolled type 2 diabetes mellitus with hyperglycemia ([place]) patient completely off his regimen today.  Sugars higher than my machine will read.  Fortunately he shows no physical manifestations of DKA.  Also fortunately he is willing to see an endocrinologist and start insulin today.  I am referring him to Dr. Deloria Lair will Exie Parody.  I will see him back in about 2 weeks from this problem so I can start his statin and ACE inhibitor back.  I expect his blood pressure well come up once his diuresis from uncontrolled diabetes improves. -     Insulin Detemir (LEVEMIR) 100 UNIT/ML Pen; Inject 10 Units into the skin daily at 10 pm. -     dapagliflozin propanediol (FARXIGA) 5 MG TABS tablet; Take 5 mg by mouth  daily. -     Insulin Pen Needle (NOVOFINE) 10 each  :  The patient is advised to call or return to clinic if he does not see an improvement in symptoms, or to seek the care of the closest emergency department if he worsens with the above plan.   Philis Fendt, MHS, PA-C [facility] 12/17/2017 12:25 PM

## 2017-12-18 LAB — COMPREHENSIVE METABOLIC PANEL
ALBUMIN: 3.8 g/dL (ref 3.5–5.5)
ALT: 11 IU/L (ref 0–44)
AST: 13 IU/L (ref 0–40)
Albumin/Globulin Ratio: 1 — ABNORMAL LOW (ref 1.2–2.2)
Alkaline Phosphatase: 63 IU/L (ref 39–117)
BUN / CREAT RATIO: 10 (ref 9–20)
BUN: 13 mg/dL (ref 6–24)
Bilirubin Total: 0.8 mg/dL (ref 0.0–1.2)
CALCIUM: 9 mg/dL (ref 8.7–10.2)
CO2: 20 mmol/L (ref 20–29)
CREATININE: 1.28 mg/dL — AB (ref 0.76–1.27)
Chloride: 97 mmol/L (ref 96–106)
GFR calc Af Amer: 75 mL/min/{1.73_m2} (ref >59)
GFR, EST NON AFRICAN AMERICAN: 65 mL/min/{1.73_m2} (ref >59)
GLOBULIN, TOTAL: 3.8 g/dL (ref 1.5–4.5)
GLUCOSE: 445 mg/dL — AB (ref 65–99)
Potassium: 4.5 mmol/L (ref 3.5–5.2)
Sodium: 132 mmol/L — ABNORMAL LOW (ref 134–144)
Total Protein: 7.6 g/dL (ref 6.0–8.5)

## 2017-12-18 LAB — CBC
HEMATOCRIT: 48.8 % (ref 37.5–51.0)
Hemoglobin: 15.7 g/dL (ref 13.0–17.7)
MCH: 29.1 pg (ref 26.6–33.0)
MCHC: 32.2 g/dL (ref 31.5–35.7)
MCV: 90 fL (ref 79–97)
Platelets: 183 10*3/uL (ref 150–450)
RBC: 5.4 x10E6/uL (ref 4.14–5.80)
RDW: 13.4 % (ref 12.3–15.4)
WBC: 6 10*3/uL (ref 3.4–10.8)

## 2017-12-18 LAB — HEMOGLOBIN A1C
Est. average glucose Bld gHb Est-mCnc: 286 mg/dL
Hgb A1c MFr Bld: 11.6 % — ABNORMAL HIGH (ref 4.8–5.6)

## 2017-12-18 LAB — MICROALBUMIN / CREATININE URINE RATIO
Creatinine, Urine: 79.1 mg/dL
MICROALB/CREAT RATIO: 15.9 mg/g{creat} (ref 0.0–30.0)
Microalbumin, Urine: 12.6 ug/mL

## 2017-12-18 LAB — LIPID PANEL
CHOLESTEROL TOTAL: 221 mg/dL — AB (ref 100–199)
Chol/HDL Ratio: 4.9 ratio (ref 0.0–5.0)
HDL: 45 mg/dL (ref >39)
LDL Calculated: 112 mg/dL — ABNORMAL HIGH (ref 0–99)
Triglycerides: 319 mg/dL — ABNORMAL HIGH (ref 0–149)
VLDL CHOLESTEROL CAL: 64 mg/dL — AB (ref 5–40)

## 2017-12-18 LAB — TSH: TSH: 0.855 u[IU]/mL (ref 0.450–4.500)

## 2017-12-18 LAB — PSA: Prostate Specific Ag, Serum: 2.5 ng/mL (ref 0.0–4.0)

## 2017-12-18 LAB — TESTOSTERONE: TESTOSTERONE: 171 ng/dL — AB (ref 264–916)

## 2018-01-03 ENCOUNTER — Other Ambulatory Visit: Payer: Self-pay

## 2018-01-03 ENCOUNTER — Encounter: Payer: Self-pay | Admitting: Physician Assistant

## 2018-01-03 ENCOUNTER — Ambulatory Visit (INDEPENDENT_AMBULATORY_CARE_PROVIDER_SITE_OTHER): Payer: No Typology Code available for payment source | Admitting: Physician Assistant

## 2018-01-03 VITALS — BP 122/82 | HR 113 | Temp 98.0°F | Resp 16 | Wt 217.0 lb

## 2018-01-03 DIAGNOSIS — E1165 Type 2 diabetes mellitus with hyperglycemia: Secondary | ICD-10-CM

## 2018-01-03 LAB — GLUCOSE, POCT (MANUAL RESULT ENTRY): POC GLUCOSE: 343 mg/dL — AB (ref 70–99)

## 2018-01-03 LAB — POCT GLYCOSYLATED HEMOGLOBIN (HGB A1C): Hemoglobin A1C: 10.8 % — AB (ref 4.0–5.6)

## 2018-01-03 MED ORDER — NOVOFINE 32G X 6 MM MISC
99 refills | Status: DC
Start: 2018-01-03 — End: 2019-04-23

## 2018-01-03 MED ORDER — INSULIN DETEMIR 100 UNIT/ML FLEXPEN: 12.0000 [IU] | PEN_INJECTOR | Freq: Every day | SUBCUTANEOUS | 11 refills | Status: DC

## 2018-01-03 NOTE — Progress Notes (Signed)
01/03/2018 10:21 AM   DOB: 09-Mar-1966 / MRN: [id number]  SUBJECTIVE:  Sean Schaefer is a 52 y.o. male presenting for recheck diabetes.  Tells me that he is taking insulin 10 units nightly along with SGLT2 inhibitor as prescribed.  Denies any problems with medications at this time.  Tells me below sure that he is seen on his meter was 219.  Feels well otherwise and denies complaints.   He has No Known Allergies.   He  has a past medical history of Diabetes mellitus without complication ([place]), Hypertension, and Ulcerative colitis ([place]).    He  reports that he has never smoked. He has never used smokeless tobacco. He reports that he does not drink alcohol or use drugs. He  reports that he currently engages in sexual activity. The patient  has a past surgical history that includes Perianal fistula repair (2012) and Colon surgery.  His family history includes Cancer in his mother; Diabetes in his paternal grandmother; Hypertension in his father and mother.  Review of Systems  Constitutional: Negative for chills, diaphoresis and fever.  Eyes: Negative.   Respiratory: Negative for cough, hemoptysis, sputum production, shortness of breath and wheezing.   Cardiovascular: Negative for chest pain, orthopnea and leg swelling.  Gastrointestinal: Negative for abdominal pain, blood in stool, constipation, diarrhea, heartburn, melena, nausea and vomiting.  Genitourinary: Negative for dysuria, flank pain, frequency, hematuria and urgency.  Skin: Negative for rash.  Neurological: Negative for dizziness, sensory change, speech change, focal weakness and headaches.    The problem list and medications were reviewed and updated by myself where necessary and exist elsewhere in the encounter.   OBJECTIVE:  BP 122/82   Pulse (!) 113   Temp 98 F (36.7 C) (Oral)   Resp 16   Wt 217 lb (98.4 kg)   SpO2 97%   BMI 32.05 kg/m   Wt Readings from Last 3 Encounters:  01/03/18 217 lb (98.4 kg)  12/17/17 220  lb (99.8 kg)  12/02/16 229 lb 9.6 oz (104.1 kg)   Temp Readings from Last 3 Encounters:  01/03/18 98 F (36.7 C) (Oral)  12/17/17 98.2 F (36.8 C) (Oral)  12/02/16 98.1 F (36.7 C) (Oral)   BP Readings from Last 3 Encounters:  01/03/18 122/82  12/17/17 104/74  12/02/16 135/84   Pulse Readings from Last 3 Encounters:  01/03/18 (!) 113  12/17/17 (!) 101  12/02/16 82     Physical Exam  Constitutional: He is oriented to person, place, and time. He appears well-developed. He does not appear ill.  Eyes: Pupils are equal, round, and reactive to light. Conjunctivae and EOM are normal.  Cardiovascular: Normal rate.  Pulmonary/Chest: Effort normal.  Abdominal: He exhibits no distension.  Musculoskeletal: Normal range of motion.  Neurological: He is alert and oriented to person, place, and time. No cranial nerve deficit. Coordination normal.  Skin: Skin is warm and dry. He is not diaphoretic.  Psychiatric: He has a normal mood and affect.  Nursing note and vitals reviewed.   Results for orders placed or performed in visit on 01/03/18 (from the past 72 hour(s))  POCT glycosylated hemoglobin (Hb A1C)     Status: Abnormal   Collection Time: 01/03/18 10:15 AM  Result Value Ref Range   Hemoglobin A1C 10.8 (A) 4.0 - 5.6 %   HbA1c, POC (prediabetic range)  5.7 - 6.4 %   HbA1c, POC (controlled diabetic range)  0.0 - 7.0 %  POCT glucose (manual entry)  Status: Abnormal   Collection Time: 01/03/18 10:15 AM  Result Value Ref Range   POC Glucose 343 (A) 70 - 99 mg/dl    No results found.  ASSESSMENT AND PLAN:  [place] was seen today for hyperglycemia.  Diagnoses and all orders for this visit:  Uncontrolled type 2 diabetes mellitus with hyperglycemia [facility]): Patient last seen 2 weeks ago.  A1c is improving.  We will see him back in about a month to ensure medication compliance and that he continues to improve.  And fasting blood sugar 343 this morning increasing to 12 units Levemir  nightly and will continue the SGLT2 inhibitor.  We will plan to start a statin at that time. -     POCT glycosylated hemoglobin (Hb A1C) -     POCT glucose (manual entry)    The patient is advised to call or return to clinic if he does not see an improvement in symptoms, or to seek the care of the closest emergency department if he worsens with the above plan.   Philis Fendt, MHS, PA-C [facility] 01/03/2018 10:21 AM

## 2018-01-03 NOTE — Patient Instructions (Addendum)
  GO up to 12 units of insulin nightly.    Results for orders placed or performed in visit on 01/03/18  POCT glycosylated hemoglobin (Hb A1C)  Result Value Ref Range   Hemoglobin A1C 10.8 (A) 4.0 - 5.6 %   HbA1c, POC (prediabetic range)  5.7 - 6.4 %   HbA1c, POC (controlled diabetic range)  0.0 - 7.0 %  POCT glucose (manual entry)  Result Value Ref Range   POC Glucose 343 (A) 70 - 99 mg/dl      IF you received an x-ray today, you will receive an invoice from [facility]. Please contact [facility] at [phone] with questions or concerns regarding your invoice.   IF you received labwork today, you will receive an invoice from [facility]. Please contact [facility] at [phone] with questions or concerns regarding your invoice.   Our billing staff will not be able to assist you with questions regarding bills from these companies.  You will be contacted with the lab results as soon as they are available. The fastest way to get your results is to activate your My Chart account. Instructions are located on the last page of this paperwork. If you have not heard from [place] regarding the results in 2 weeks, please contact this office.

## 2018-01-11 MED FILL — NOVOFINE 32G NEEDLES: 32G X 6 MM | 90 days supply | Qty: 100 | Fill #0

## 2018-01-14 MED FILL — LEVEMIR FLEXTOUCH 100 UNITS: 100 | 28 days supply | Qty: 3 | Fill #1

## 2018-02-04 ENCOUNTER — Ambulatory Visit (INDEPENDENT_AMBULATORY_CARE_PROVIDER_SITE_OTHER): Payer: No Typology Code available for payment source | Admitting: Physician Assistant

## 2018-02-04 ENCOUNTER — Encounter: Payer: Self-pay | Admitting: Physician Assistant

## 2018-02-04 VITALS — BP 136/86 | HR 99 | Temp 98.3°F | Resp 17 | Ht 72.0 in | Wt 224.0 lb

## 2018-02-04 DIAGNOSIS — E1165 Type 2 diabetes mellitus with hyperglycemia: Secondary | ICD-10-CM | POA: Diagnosis not present

## 2018-02-04 DIAGNOSIS — E119 Type 2 diabetes mellitus without complications: Secondary | ICD-10-CM | POA: Diagnosis not present

## 2018-02-04 DIAGNOSIS — Z794 Long term (current) use of insulin: Secondary | ICD-10-CM

## 2018-02-04 NOTE — Progress Notes (Signed)
02/11/2018 11:20 AM   DOB: [birth year] / MRN: [id number]  SUBJECTIVE:  Sean Schaefer is a 52 y.o. male presenting for diabetes recheck. He feels well today and takes his SGLT2 and insulin without missing doses.  He tells me taking the insulin is easy for him and states, "it takes like 15 seconds."  He has No Known Allergies.   He  has a past medical history of Diabetes mellitus without complication ([place]), Hypertension, and Ulcerative colitis ([place]).    He  reports that he has never smoked. He has never used smokeless tobacco. He reports that he does not drink alcohol or use drugs. He  reports that he currently engages in sexual activity. The patient  has a past surgical history that includes Perianal fistula repair (2012) and Colon surgery.  His family history includes Cancer in his mother; Diabetes in his paternal grandmother; Hypertension in his father and mother.  Review of Systems  Constitutional: Negative for chills, diaphoresis and fever.  Eyes: Negative.   Respiratory: Negative for cough, hemoptysis, sputum production, shortness of breath and wheezing.   Cardiovascular: Negative for chest pain, orthopnea and leg swelling.  Gastrointestinal: Negative for abdominal pain, blood in stool, constipation, diarrhea, heartburn, melena, nausea and vomiting.  Genitourinary: Negative for dysuria, flank pain, frequency, hematuria and urgency.  Skin: Negative for rash.  Neurological: Negative for dizziness, sensory change, speech change, focal weakness and headaches.    The problem list and medications were reviewed and updated by myself where necessary and exist elsewhere in the encounter.   OBJECTIVE:  BP 136/86   Pulse 99   Temp 98.3 F (36.8 C) (Oral)   Resp 17   Ht 6' (1.829 m)   Wt 224 lb (101.6 kg)   SpO2 98%   BMI 30.38 kg/m   Wt Readings from Last 3 Encounters:  02/04/18 224 lb (101.6 kg)  01/03/18 217 lb (98.4 kg)  12/17/17 220 lb (99.8 kg)   Temp Readings from Last 3  Encounters:  02/04/18 98.3 F (36.8 C) (Oral)  01/03/18 98 F (36.7 C) (Oral)  12/17/17 98.2 F (36.8 C) (Oral)   BP Readings from Last 3 Encounters:  02/04/18 136/86  01/03/18 122/82  12/17/17 104/74   Pulse Readings from Last 3 Encounters:  02/04/18 99  01/03/18 (!) 113  12/17/17 (!) 101    Physical Exam  Constitutional: He is oriented to person, place, and time. He appears well-developed. He does not appear ill.  Eyes: Pupils are equal, round, and reactive to light. Conjunctivae and EOM are normal.  Cardiovascular: Normal rate, regular rhythm, S1 normal, S2 normal, normal heart sounds, intact distal pulses and normal pulses. Exam reveals no gallop and no friction rub.  No murmur heard. Pulmonary/Chest: Effort normal. No stridor. No respiratory distress. He has no wheezes. He has no rales.  Abdominal: He exhibits no distension.  Musculoskeletal: Normal range of motion. He exhibits no edema.  Neurological: He is alert and oriented to person, place, and time. No cranial nerve deficit. Coordination normal.  Skin: Skin is warm and dry. He is not diaphoretic.  Psychiatric: He has a normal mood and affect.  Nursing note and vitals reviewed.   Lab Results  Component Value Date   HGBA1C 10.4 (H) 02/04/2018    Lab Results  Component Value Date   WBC 6.0 12/17/2017   HGB 15.7 12/17/2017   HCT 48.8 12/17/2017   MCV 90 12/17/2017   PLT 183 12/17/2017    Lab Results  Component Value Date   CREATININE 1.28 (H) 12/17/2017   BUN 13 12/17/2017   NA 132 (L) 12/17/2017   K 4.5 12/17/2017   CL 97 12/17/2017   CO2 20 12/17/2017    Lab Results  Component Value Date   ALT 11 12/17/2017   AST 13 12/17/2017   ALKPHOS 63 12/17/2017   BILITOT 0.8 12/17/2017    Lab Results  Component Value Date   TSH 0.855 12/17/2017    Lab Results  Component Value Date   CHOL 206 (H) 02/04/2018   HDL 38 (L) 02/04/2018   LDLCALC 105 (H) 02/04/2018   TRIG 315 (H) 02/04/2018   CHOLHDL  5.4 (H) 02/04/2018     ASSESSMENT AND PLAN:  Sean Schaefer was seen today for follow-up and medication refill.  Diagnoses and all orders for this visit:  Type 2 diabetes mellitus without complication, with long-term current use of insulin ([place]) -     Ambulatory referral to Ophthalmology -     Hemoglobin A1c -     Lipid Panel  Uncontrolled type 2 diabetes mellitus with hyperglycemia ([facility]) -     Insulin Detemir (LEVEMIR) 100 UNIT/ML Pen; Inject 17 Units into the skin daily at 10 pm. E11.65    The patient is advised to call or return to clinic if he does not see an improvement in symptoms, or to seek the care of the closest emergency department if he worsens with the above plan.   Philis Fendt, MHS, PA-C [facility] 02/11/2018 11:20 AM

## 2018-02-04 NOTE — Patient Instructions (Signed)
     IF you received an x-ray today, you will receive an invoice from [facility]. Please contact [facility] at [phone] with questions or concerns regarding your invoice.   IF you received labwork today, you will receive an invoice from [facility]. Please contact [facility] at [phone] with questions or concerns regarding your invoice.   Our billing staff will not be able to assist you with questions regarding bills from these companies.  You will be contacted with the lab results as soon as they are available. The fastest way to get your results is to activate your My Chart account. Instructions are located on the last page of this paperwork. If you have not heard from us regarding the results in 2 weeks, please contact this office.     

## 2018-02-05 LAB — LIPID PANEL
Chol/HDL Ratio: 5.4 ratio — ABNORMAL HIGH (ref 0.0–5.0)
Cholesterol, Total: 206 mg/dL — ABNORMAL HIGH (ref 100–199)
HDL: 38 mg/dL — ABNORMAL LOW (ref >39)
LDL Calculated: 105 mg/dL — ABNORMAL HIGH (ref 0–99)
TRIGLYCERIDES: 315 mg/dL — AB (ref 0–149)
VLDL CHOLESTEROL CAL: 63 mg/dL — AB (ref 5–40)

## 2018-02-05 LAB — HEMOGLOBIN A1C
ESTIMATED AVERAGE GLUCOSE: 252 mg/dL
HEMOGLOBIN A1C: 10.4 % — AB (ref 4.8–5.6)

## 2018-02-11 MED ORDER — INSULIN DETEMIR 100 UNIT/ML FLEXPEN: 17.0000 [IU] | PEN_INJECTOR | Freq: Every day | SUBCUTANEOUS | 11 refills | Status: DC

## 2018-02-11 MED FILL — LEVEMIR FLEXTOUCH 100 UNITS: 100 | 17 days supply | Qty: 3 | Fill #0

## 2018-02-17 MED FILL — FARXIGA 5 MG TABLET: 5 | 30 days supply | Qty: 30 | Fill #1

## 2018-03-05 ENCOUNTER — Ambulatory Visit: Payer: No Typology Code available for payment source | Admitting: Family Medicine

## 2018-03-10 ENCOUNTER — Ambulatory Visit (INDEPENDENT_AMBULATORY_CARE_PROVIDER_SITE_OTHER): Payer: No Typology Code available for payment source | Admitting: Family Medicine

## 2018-03-10 ENCOUNTER — Other Ambulatory Visit: Payer: Self-pay

## 2018-03-10 ENCOUNTER — Encounter: Payer: Self-pay | Admitting: Family Medicine

## 2018-03-10 VITALS — BP 136/80 | HR 107 | Temp 98.6°F | Ht 69.0 in | Wt 220.0 lb

## 2018-03-10 DIAGNOSIS — E785 Hyperlipidemia, unspecified: Secondary | ICD-10-CM

## 2018-03-10 DIAGNOSIS — E1165 Type 2 diabetes mellitus with hyperglycemia: Secondary | ICD-10-CM | POA: Diagnosis not present

## 2018-03-10 DIAGNOSIS — Z794 Long term (current) use of insulin: Secondary | ICD-10-CM

## 2018-03-10 LAB — GLUCOSE, POCT (MANUAL RESULT ENTRY): POC Glucose: 366 mg/dl — AB (ref 70–99)

## 2018-03-10 MED ORDER — DAPAGLIFLOZIN PROPANEDIOL 5 MG PO TABS: 5.0000 mg | ORAL_TABLET | Freq: Every day | ORAL | 1 refills | Status: DC

## 2018-03-10 MED ORDER — INSULIN DETEMIR 100 UNIT/ML FLEXPEN: 17.0000 [IU] | PEN_INJECTOR | Freq: Every day | SUBCUTANEOUS | 1 refills | Status: DC

## 2018-03-10 MED FILL — LEVEMIR FLEXTOUCH 100 UNITS: 100 | 17 days supply | Qty: 3 | Fill #0

## 2018-03-10 NOTE — Progress Notes (Signed)
Subjective:  By signing my name below, I, [name], attest that this documentation has been prepared under the direction and in the presence of Wendie Agreste, MD Electronically Signed: Ladene Artist, ED Scribe 03/10/2018 at 4:24 PM.   Patient ID: Sean Schaefer, male    DOB: [birth year], 52 y.o.   MRN: [id number]  Chief Complaint  Patient presents with  . Hypertension  . Hyperlipidemia  . Diabetes    insulin refill   HPI Sean Schaefer is a 53 y.o. male who presents to Primary Care at [facility] for med refill. Most recently seen 7/17 by Philis Fendt, PA-C. Pt last ate around 1-2 PM today.  HTN BP Readings from Last 3 Encounters:  03/10/18 136/80  02/04/18 136/86  01/03/18 122/82   Lab Results  Component Value Date   CREATININE 1.28 (H) 12/17/2017  Not currently on medication. Reported h/o of HTN but BP has been ok off meds.  DM Lab Results  Component Value Date   HGBA1C 10.4 (H) 02/04/2018  Compliant with meds at last visit including insulin. Referred to optho. Increased Levemir to 17 units qhs based on last A1C which was improving. Prev A1C of 10.8 and 11.6 in May. Also on Farxiga 5 mg qd. - Pt states that he was off all meds in May, however, he has been compliant with Levemir and [place]. Reports that he has an upcoming appointment with a nutritionist and has a new accountability wellness coach through [facility]. He does not currently have a glucometer but states that he is able to get a temporary one until his new glucometer comes in. Reports that he does not eat snacks throughout the day but denies recent symptomatic lows. Pt states he has only had 1 low since diagnosis while doing yard work in the sun. Denies UTI symptoms, yeast, rash in groins.  Hyperlipidemia Lab Results  Component Value Date   CHOL 206 (H) 02/04/2018   HDL 38 (L) 02/04/2018   LDLCALC 105 (H) 02/04/2018   TRIG 315 (H) 02/04/2018   CHOLHDL 5.4 (H) 02/04/2018   Lab Results  Component Value Date    ALT 11 12/17/2017   AST 13 12/17/2017   ALKPHOS 63 12/17/2017   BILITOT 0.8 12/17/2017  Ideal goal less than 70 with DM. Statin was recommended after last OV. - Pt states he doesn't have a family h/o heart disease so he doesn't want to start it at this visit. He plans to discuss statin further with his brother who is a Marine scientist and read more about it.  Patient Active Problem List   Diagnosis Date Noted  . 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 Diabetes ([place]) 12/26/2014  . 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 Crohn's disease ([place]) 12/18/2013   Past Medical History:  Diagnosis Date  . Diabetes mellitus without complication ([place])   . Hypertension   . Ulcerative colitis [facility])    Past Surgical History:  Procedure Laterality Date  . COLON SURGERY    . Perianal fistula repair  2012   No Known Allergies Prior to Admission medications   Medication Sig Start Date End Date Taking? Authorizing Provider  dapagliflozin propanediol (FARXIGA) 5 MG TABS tablet Take 5 mg by mouth daily. 12/17/17   Tereasa Coop, PA-C  Insulin Detemir (LEVEMIR) 100 UNIT/ML Pen Inject 17 Units into the skin daily at 10 pm. E11.65 02/11/18   Philis Fendt L, PA-C  NOVOFINE 32G X 6 MM MISC Use as directed.E11.65 Patient  not taking: Reported on 02/04/2018 01/03/18   Tereasa Coop, PA-C   Social History   Socioeconomic History  . Marital status: Married    Spouse name: Not on file  . Number of children: Not on file  . Years of education: Not on file  . Highest education level: Not on file  Occupational History  . Not on file  Social Needs  . Financial resource strain: Not on file  . Food insecurity:    Worry: Not on file    Inability: Not on file  . Transportation needs:    Medical: Not on file    Non-medical: Not on file  Tobacco Use  . Smoking status: Never Smoker  . Smokeless tobacco: Never Used  Substance and Sexual Activity  . Alcohol use: No  . Drug use: No  . Sexual  activity: Yes  Lifestyle  . Physical activity:    Days per week: Not on file    Minutes per session: Not on file  . Stress: Not on file  Relationships  . Social connections:    Talks on phone: Not on file    Gets together: Not on file    Attends religious service: Not on file    Active member of club or organization: Not on file    Attends meetings of clubs or organizations: Not on file    Relationship status: Not on file  . Intimate partner violence:    Fear of current or ex partner: Not on file    Emotionally abused: Not on file    Physically abused: Not on file    Forced sexual activity: Not on file  Other Topics Concern  . Not on file  Social History Narrative  . Not on file   Review of Systems  Constitutional: Negative for fatigue and unexpected weight change.  Eyes: Negative for visual disturbance.  Respiratory: Negative for cough, chest tightness and shortness of breath.   Cardiovascular: Negative for chest pain, palpitations and leg swelling.  Gastrointestinal: Negative for abdominal pain and blood in stool.  Genitourinary: Negative.   Skin: Negative for rash.  Neurological: Negative for dizziness, light-headedness and headaches.      Objective:   Physical Exam  Constitutional: He is oriented to person, place, and time. He appears well-developed and well-nourished.  HENT:  Head: Normocephalic and atraumatic.  Eyes: Pupils are equal, round, and reactive to light. EOM are normal.  Neck: No JVD present. Carotid bruit is not present.  Cardiovascular: Normal rate, regular rhythm and normal heart sounds.  No murmur heard. Pulmonary/Chest: Effort normal and breath sounds normal. He has no rales.  Musculoskeletal: He exhibits no edema.  Neurological: He is alert and oriented to person, place, and time.  Skin: Skin is warm and dry.  Psychiatric: He has a normal mood and affect.  Vitals reviewed.  Vitals:   03/10/18 1537 03/10/18 1540  BP: (!) 142/81 136/80  Pulse:  (!) 107   Temp: 98.6 F (37 C)   TempSrc: Oral   SpO2: 96%   Weight: 220 lb (99.8 kg)   Height: 5' 9"  (1.753 m)    Results for orders placed or performed in visit on 03/10/18  POCT glucose (manual entry)  Result Value Ref Range   POC Glucose 366 (A) 70 - 99 mg/dl      Assessment & Plan:    TANDRE CONLY is a 52 y.o. male Type 2 diabetes mellitus with hyperglycemia, with long-term current use of insulin ([place]) -  Plan: POCT glucose (manual entry) Uncontrolled type 2 diabetes mellitus with hyperglycemia ([facility]) - Plan: Insulin Detemir (LEVEMIR) 100 UNIT/ML Pen, dapagliflozin propanediol (FARXIGA) 5 MG TABS tablet  -Controlled diabetes.  Now is committed to control with medication adherence coach with [facility] working on diet along with meds.  Commended on his efforts  -Unfortunately blood sugar is still pretty high in the visit today.  Stressed importance of regular meals to minimize chance of hypoglycemia, but then increase insulin dosing by 2 units every 3 days until readings below 200.  Then can remain at that dose until follow-up to discuss further titration.  Continue Farxiga same dose for now.  -Statin discussed, but declined at this time.  He would like to discuss further with family.  Can review that further at next visit.  Also would monitor blood pressure and urine microalbumin to determine need for ACE inhibitor.  Currently on either statin or ACE.  Hyperlipidemia, unspecified hyperlipidemia type  -As above he would like to hold off on a statin at this time.  Can discuss further next visit after he has reviewed more information.   Meds ordered this encounter  Medications  . Insulin Detemir (LEVEMIR) 100 UNIT/ML Pen    Sig: Inject 17 Units into the skin daily at 10 pm. E11.65    Dispense:  15 mL    Refill:  1  . dapagliflozin propanediol (FARXIGA) 5 MG TABS tablet    Sig: Take 5 mg by mouth daily.    Dispense:  90 tablet    Refill:  1   Patient Instructions   Check  blood sugars (let me know if you need a meter). Once you are on regular meals, can increase insulin by 2 units every 3 days until blood sugars remain below 200, then stay at that dose until follow up. Watch for low blood sugars as you increase your insulin dose.    See other recommendations below for diabetes.   I would recommend starting lipitor once per day for cholesterol. Let me know if we can start that after you research that further.   Thank you for coming in today.   Type 2 Diabetes Mellitus, Self Care, Adult When you have type 2 diabetes (type 2 diabetes mellitus), you must keep your blood sugar (glucose) under control. You can do this with:  Nutrition.  Exercise.  Lifestyle changes.  Medicines or insulin, if needed.  Support from your doctors and others.  How do I manage my blood sugar?  Check your blood sugar level every day, as often as told.  Call your doctor if your blood sugar is above your goal numbers for 2 tests in a row.  Have your A1c (hemoglobin A1c) level checked at least two times a year. Have it checked more often if your doctor tells you to. Your doctor will set treatment goals for you. Generally, you should have these blood sugar levels:  Before meals (preprandial): 80-130 mg/dL (4.4-7.2 mmol/L).  After meals (postprandial): lower than 180 mg/dL (10 mmol/L).  A1c level: less than 7%.  What do I need to know about high blood sugar? High blood sugar is called hyperglycemia. Know the signs of high blood sugar. Signs may include:  Feeling: ? Thirsty. ? Hungry. ? Very tired.  Needing to pee (urinate) more than usual.  Blurry vision.  What do I need to know about low blood sugar? Low blood sugar is called hypoglycemia. This is when blood sugar is at or below 70 mg/dL (  3.9 mmol/L). Symptoms may include:  Feeling: ? Hungry. ? Worried or nervous (anxious). ? Sweaty and clammy. ? Confused. ? Dizzy. ? Sleepy. ? Sick to your stomach  (nauseous).  Having: ? A fast heartbeat (palpitations). ? A headache. ? A change in your vision. ? Jerky movements that you cannot control (seizure). ? Nightmares. ? Tingling or no feeling (numbness) around the mouth, lips, or tongue.  Having trouble with: ? Talking. ? Paying attention (concentrating). ? Moving (coordination). ? Sleeping.  Shaking.  Passing out (fainting).  Getting upset easily (irritability).  Treating low blood sugar  To treat low blood sugar, eat or drink something sugary right away. If you can think clearly and swallow safely, follow the 15:15 rule:  Take 15 grams of a fast-acting carb (carbohydrate). Some fast-acting carbs are: ? 1 tube of glucose gel. ? 3 sugar tablets (glucose pills). ? 6-8 pieces of hard candy. ? 4 oz (120 mL) of fruit juice. ? 4 oz (120 mL) regular (not diet) soda.  Check your blood sugar 15 minutes after you take the carb.  If your blood sugar is still at or below 70 mg/dL (3.9 mmol/L), take 15 grams of a carb again.  If your blood sugar does not go above 70 mg/dL (3.9 mmol/L) after 3 tries, get help right away.  After your blood sugar goes back to normal, eat a meal or a snack within 1 hour.  Treating very low blood sugar If your blood sugar is at or below 54 mg/dL (3 mmol/L), you have very low blood sugar (severe hypoglycemia). This is an emergency. Do not wait to see if the symptoms will go away. Get medical help right away. Call your local emergency services (911 in the [place] Do not drive yourself to the hospital. If you have very low blood sugar and you cannot eat or drink, you may need a glucagon shot (injection). A family member or friend should learn how to check your blood sugar and how to give you a glucagon shot. Ask your doctor if you need to have a glucagon shot kit at home. What else is important to manage my diabetes? Medicine Follow these instructions about insulin and diabetes medicines:  Take them as told  by your doctor.  Adjust them as told by your doctor.  Do not run out of them.  Having diabetes can raise your risk for other long-term conditions. These include heart or kidney disease. Your doctor may prescribe medicines to help prevent problems from diabetes. Food   Make healthy food choices. These include: ? Chicken, fish, egg whites, and beans. ? Oats, whole wheat, bulgur, brown rice, quinoa, and millet. ? Fresh fruits and vegetables. ? Low-fat dairy products. ? Nuts, avocado, olive oil, and canola oil.  Make a food plan with a specialist (dietitian).  Follow instructions from your doctor about what you cannot eat or drink.  Drink enough fluid to keep your pee (urine) clear or pale yellow.  Eat healthy snacks between healthy meals.  Keep track of carbs that you eat. Read food labels. Learn food serving sizes.  Follow your sick day plan when you cannot eat or drink normally. Make this plan with your doctor so it is ready to use. Activity  Exercise at least 3 times a week.  Do not go more than 2 days without exercising.  Talk with your doctor before you start a new exercise. Your doctor may need to adjust your insulin, medicines, or food. Lifestyle   Do  not use any tobacco products. These include cigarettes, chewing tobacco, and e-cigarettes.If you need help quitting, ask your doctor.  Ask your doctor how much alcohol is safe for you.  Learn to deal with stress. If you need help with this, ask your doctor. Body care  Stay up to date with your shots (immunizations).  Have your eyes and feet checked by a doctor as often as told.  Check your skin and feet every day. Check for cuts, bruises, redness, blisters, or sores.  Brush your teeth and gums two times a day.  Floss at least one time a day.  Go to the dentist least one time every 6 months.  Stay at a healthy weight. General instructions   Take over-the-counter and prescription medicines only as told by  your doctor.  Share your diabetes care plan with: ? Your work or school. ? People you live with.  Check your pee (urine) for ketones: ? When you are sick. ? As told by your doctor.  Carry a card or wear jewelry that says that you have diabetes.  Ask your doctor: ? Do I need to meet with a diabetes educator? ? Where can I find a support group for people with diabetes?  Keep all follow-up visits as told by your doctor. This is important. Where to find more information: To learn more about diabetes, visit:  American Diabetes Association: www.diabetes.org  American Association of Diabetes Educators: www.diabeteseducator.org/patient-resources  This information is not intended to replace advice given to you by your health care provider. Make sure you discuss any questions you have with your health care provider. Document Released: 10/30/2015 Document Revised: 12/14/2015 Document Reviewed: 08/11/2015 Elsevier Interactive Patient Education  [employer].   If you have lab work done today you will be contacted with your lab results within the next 2 weeks.  If you have not heard from [place] then please contact us. The fastest way to get your results is to register for My Chart.   IF you received an x-ray today, you will receive an invoice from [facility]. Please contact [facility] at [phone] with questions or concerns regarding your invoice.   IF you received labwork today, you will receive an invoice from [place]. Please contact [facility] at [phone] with questions or concerns regarding your invoice.   Our billing staff will not be able to assist you with questions regarding bills from these companies.  You will be contacted with the lab results as soon as they are available. The fastest way to get your results is to activate your My Chart account. Instructions are located on the last page of this paperwork. If you have not heard from [place] regarding the  results in 2 weeks, please contact this office.       I personally performed the services described in this documentation, which was scribed in my presence. The recorded information has been reviewed and considered for accuracy and completeness, addended by me as needed, and agree with information above.  Signed,   Merri Ray, MD Primary Care at [place].  03/11/18 10:41 PM

## 2018-03-10 NOTE — Patient Instructions (Addendum)
Check blood sugars (let me know if you need a meter). Once you are on regular meals, can increase insulin by 2 units every 3 days until blood sugars remain below 200, then stay at that dose until follow up. Watch for low blood sugars as you increase your insulin dose.    See other recommendations below for diabetes.   I would recommend starting lipitor once per day for cholesterol. Let me know if we can start that after you research that further.   Thank you for coming in today.   Type 2 Diabetes Mellitus, Self Care, Adult When you have type 2 diabetes (type 2 diabetes mellitus), you must keep your blood sugar (glucose) under control. You can do this with:  Nutrition.  Exercise.  Lifestyle changes.  Medicines or insulin, if needed.  Support from your doctors and others.  How do I manage my blood sugar?  Check your blood sugar level every day, as often as told.  Call your doctor if your blood sugar is above your goal numbers for 2 tests in a row.  Have your A1c (hemoglobin A1c) level checked at least two times a year. Have it checked more often if your doctor tells you to. Your doctor will set treatment goals for you. Generally, you should have these blood sugar levels:  Before meals (preprandial): 80-130 mg/dL (4.4-7.2 mmol/L).  After meals (postprandial): lower than 180 mg/dL (10 mmol/L).  A1c level: less than 7%.  What do I need to know about high blood sugar? High blood sugar is called hyperglycemia. Know the signs of high blood sugar. Signs may include:  Feeling: ? Thirsty. ? Hungry. ? Very tired.  Needing to pee (urinate) more than usual.  Blurry vision.  What do I need to know about low blood sugar? Low blood sugar is called hypoglycemia. This is when blood sugar is at or below 70 mg/dL (3.9 mmol/L). Symptoms may include:  Feeling: ? Hungry. ? Worried or nervous (anxious). ? Sweaty and clammy. ? Confused. ? Dizzy. ? Sleepy. ? Sick to your stomach  (nauseous).  Having: ? A fast heartbeat (palpitations). ? A headache. ? A change in your vision. ? Jerky movements that you cannot control (seizure). ? Nightmares. ? Tingling or no feeling (numbness) around the mouth, lips, or tongue.  Having trouble with: ? Talking. ? Paying attention (concentrating). ? Moving (coordination). ? Sleeping.  Shaking.  Passing out (fainting).  Getting upset easily (irritability).  Treating low blood sugar  To treat low blood sugar, eat or drink something sugary right away. If you can think clearly and swallow safely, follow the 15:15 rule:  Take 15 grams of a fast-acting carb (carbohydrate). Some fast-acting carbs are: ? 1 tube of glucose gel. ? 3 sugar tablets (glucose pills). ? 6-8 pieces of hard candy. ? 4 oz (120 mL) of fruit juice. ? 4 oz (120 mL) regular (not diet) soda.  Check your blood sugar 15 minutes after you take the carb.  If your blood sugar is still at or below 70 mg/dL (3.9 mmol/L), take 15 grams of a carb again.  If your blood sugar does not go above 70 mg/dL (3.9 mmol/L) after 3 tries, get help right away.  After your blood sugar goes back to normal, eat a meal or a snack within 1 hour.  Treating very low blood sugar If your blood sugar is at or below 54 mg/dL (3 mmol/L), you have very low blood sugar (severe hypoglycemia). This is an emergency. Do  not wait to see if the symptoms will go away. Get medical help right away. Call your local emergency services (911 in the [place] Do not drive yourself to the hospital. If you have very low blood sugar and you cannot eat or drink, you may need a glucagon shot (injection). A family member or friend should learn how to check your blood sugar and how to give you a glucagon shot. Ask your doctor if you need to have a glucagon shot kit at home. What else is important to manage my diabetes? Medicine Follow these instructions about insulin and diabetes medicines:  Take them as told  by your doctor.  Adjust them as told by your doctor.  Do not run out of them.  Having diabetes can raise your risk for other long-term conditions. These include heart or kidney disease. Your doctor may prescribe medicines to help prevent problems from diabetes. Food   Make healthy food choices. These include: ? Chicken, fish, egg whites, and beans. ? Oats, whole wheat, bulgur, brown rice, quinoa, and millet. ? Fresh fruits and vegetables. ? Low-fat dairy products. ? Nuts, avocado, olive oil, and canola oil.  Make a food plan with a specialist (dietitian).  Follow instructions from your doctor about what you cannot eat or drink.  Drink enough fluid to keep your pee (urine) clear or pale yellow.  Eat healthy snacks between healthy meals.  Keep track of carbs that you eat. Read food labels. Learn food serving sizes.  Follow your sick day plan when you cannot eat or drink normally. Make this plan with your doctor so it is ready to use. Activity  Exercise at least 3 times a week.  Do not go more than 2 days without exercising.  Talk with your doctor before you start a new exercise. Your doctor may need to adjust your insulin, medicines, or food. Lifestyle   Do not use any tobacco products. These include cigarettes, chewing tobacco, and e-cigarettes.If you need help quitting, ask your doctor.  Ask your doctor how much alcohol is safe for you.  Learn to deal with stress. If you need help with this, ask your doctor. Body care  Stay up to date with your shots (immunizations).  Have your eyes and feet checked by a doctor as often as told.  Check your skin and feet every day. Check for cuts, bruises, redness, blisters, or sores.  Brush your teeth and gums two times a day.  Floss at least one time a day.  Go to the dentist least one time every 6 months.  Stay at a healthy weight. General instructions   Take over-the-counter and prescription medicines only as told by  your doctor.  Share your diabetes care plan with: ? Your work or school. ? People you live with.  Check your pee (urine) for ketones: ? When you are sick. ? As told by your doctor.  Carry a card or wear jewelry that says that you have diabetes.  Ask your doctor: ? Do I need to meet with a diabetes educator? ? Where can I find a support group for people with diabetes?  Keep all follow-up visits as told by your doctor. This is important. Where to find more information: To learn more about diabetes, visit:  American Diabetes Association: www.diabetes.org  American Association of Diabetes Educators: www.diabeteseducator.org/patient-resources  This information is not intended to replace advice given to you by your health care provider. Make sure you discuss any questions you have with your health  care provider. Document Released: 10/30/2015 Document Revised: 12/14/2015 Document Reviewed: 08/11/2015 Elsevier Interactive Patient Education  [employer].   If you have lab work done today you will be contacted with your lab results within the next 2 weeks.  If you have not heard from [place] then please contact us. The fastest way to get your results is to register for My Chart.   IF you received an x-ray today, you will receive an invoice from [facility]. Please contact [facility] Radiology at [phone] with questions or concerns regarding your invoice.   IF you received labwork today, you will receive an invoice from [place]. Please contact [facility] at [phone] with questions or concerns regarding your invoice.   Our billing staff will not be able to assist you with questions regarding bills from these companies.  You will be contacted with the lab results as soon as they are available. The fastest way to get your results is to activate your My Chart account. Instructions are located on the last page of this paperwork. If you have not heard from [place] regarding the  results in 2 weeks, please contact this office.

## 2018-03-11 MED FILL — FARXIGA 5 MG TABLET: 5 | 90 days supply | Qty: 90 | Fill #0

## 2018-04-14 MED FILL — LEVEMIR FLEXTOUCH 100 UNITS: 100 | 17 days supply | Qty: 3 | Fill #1

## 2018-06-12 ENCOUNTER — Other Ambulatory Visit: Payer: Self-pay

## 2018-06-12 ENCOUNTER — Telehealth: Payer: Self-pay

## 2018-06-12 DIAGNOSIS — E1165 Type 2 diabetes mellitus with hyperglycemia: Secondary | ICD-10-CM

## 2018-06-12 MED ORDER — INSULIN DETEMIR 100 UNIT/ML FLEXPEN: 17.0000 [IU] | PEN_INJECTOR | Freq: Every day | SUBCUTANEOUS | 1 refills | Status: DC

## 2018-06-12 NOTE — Telephone Encounter (Signed)
Pt came to office to to request refill of Levimir.  States he is working with someone and trying to get back on track.  Refill sent with note that pt needs appt prior to add'l refills.

## 2018-06-16 MED FILL — LEVEMIR FLEXTOUCH 100 UNITS: 100 | 17 days supply | Qty: 3 | Fill #2

## 2018-07-08 ENCOUNTER — Encounter: Payer: Self-pay | Admitting: Family Medicine

## 2018-07-08 ENCOUNTER — Other Ambulatory Visit: Payer: Self-pay

## 2018-07-08 ENCOUNTER — Ambulatory Visit (INDEPENDENT_AMBULATORY_CARE_PROVIDER_SITE_OTHER): Payer: No Typology Code available for payment source | Admitting: Family Medicine

## 2018-07-08 VITALS — BP 136/82 | HR 92 | Temp 98.6°F | Resp 18 | Ht 69.0 in | Wt 216.8 lb

## 2018-07-08 DIAGNOSIS — E1165 Type 2 diabetes mellitus with hyperglycemia: Secondary | ICD-10-CM

## 2018-07-08 DIAGNOSIS — R7989 Other specified abnormal findings of blood chemistry: Secondary | ICD-10-CM

## 2018-07-08 DIAGNOSIS — Z23 Encounter for immunization: Secondary | ICD-10-CM | POA: Diagnosis not present

## 2018-07-08 DIAGNOSIS — Z9114 Patient's other noncompliance with medication regimen: Secondary | ICD-10-CM

## 2018-07-08 LAB — GLUCOSE, POCT (MANUAL RESULT ENTRY): POC Glucose: 217 mg/dl — AB (ref 70–99)

## 2018-07-08 LAB — BASIC METABOLIC PANEL
BUN / CREAT RATIO: 12 (ref 9–20)
BUN: 13 mg/dL (ref 6–24)
CALCIUM: 9.6 mg/dL (ref 8.7–10.2)
CHLORIDE: 99 mmol/L (ref 96–106)
CO2: 23 mmol/L (ref 20–29)
CREATININE: 1.09 mg/dL (ref 0.76–1.27)
GFR, EST AFRICAN AMERICAN: 91 mL/min/{1.73_m2} (ref >59)
GFR, EST NON AFRICAN AMERICAN: 79 mL/min/{1.73_m2} (ref >59)
Glucose: 232 mg/dL — ABNORMAL HIGH (ref 65–99)
Potassium: 4.8 mmol/L (ref 3.5–5.2)
Sodium: 142 mmol/L (ref 134–144)

## 2018-07-08 LAB — POCT GLYCOSYLATED HEMOGLOBIN (HGB A1C): Hemoglobin A1C: 11.6 % — AB (ref 4.0–5.6)

## 2018-07-08 MED ORDER — BLOOD GLUCOSE METER KIT: PACK | 0 refills | Status: DC

## 2018-07-08 NOTE — Progress Notes (Signed)
Subjective:    Patient ID: Sean Schaefer, male    DOB: [birth year], 52 y.o.   MRN: [id number]  HPI Sean Schaefer is a 52 y.o. male Presents today for: Chief Complaint  Patient presents with  . Diabetes    follow up  . Medication Refill    would like to discuss levemir    Diabetes: Uncontrolled with previous nonadherence to medication earlier in the year.  Most recent visit August 20.  Was taking Farxiga 5 mg daily.  Plan to meet with wellness coach and nutritionist after last visit.  Had been on 17 units of Levemir.  Reading of 366 in the office last visit.  Plan to increase Levemir by 2 units every 3 days until readings below 200.  Also recommended regular meals to minimize chance of hypoglycemia. Planned for 6 week follow up.   No other OV for diabetes since August. 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   On active health with [facility].  Levemir has been $30, wanted to see if other insulin may be less. Increased Levemir to 19 units, readings decreased to 180, 160.  Ran out of insulin in September (did not get full rx due to though of getting pens free, but did not get free) off insulin from September to November. Restarted insulin late November. Didn't have time to pick up pen.  levemir 19 units.  Had vomiting 2 times initially with using Levemir, but glucose were in 200's. Wants new glucometer. Freestyle lite or contour next No recent vomiting, or abd pain. No increased thirst or urinary frequency lately.   Statin was recommended previously, but declined at last visit.  Planned to discuss with his brother who is a Marine scientist. Has not talked to brother. Still wants to decline statin.   Blood pressure borderline last visit with initial reading 142/81, then 136/80 with recheck.  BP Readings from Last 3 Encounters:  07/08/18 (!) 149/88  03/10/18 136/80  02/04/18 136/86   Optho exam 11/14/2015, ordered in July. Normal  urine microalbumin creatinine ratio in May  Lab Results  Component Value Date   HGBA1C 11.6 (A) 07/08/2018   HGBA1C 10.4 (H) 02/04/2018   HGBA1C 10.8 (A) 01/03/2018   Lab Results  Component Value Date   LDLCALC 105 (H) 02/04/2018   CREATININE 1.28 (H) 12/17/2017  Borderline creatinine in May, 1.27 last year.    Patient Active Problem List   Diagnosis Date Noted  . 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 Diabetes ([place]) 12/26/2014  . 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 Crohn's disease ([place]) 12/18/2013   Past Medical History:  Diagnosis Date  . Diabetes mellitus without complication ([place])   . Hypertension   . Ulcerative colitis [facility])    Past Surgical History:  Procedure Laterality Date  . COLON SURGERY    . Perianal fistula repair  2012   No Known Allergies Prior to Admission medications   Medication Sig Start Date End Date Taking? Authorizing Provider  dapagliflozin propanediol (FARXIGA) 5 MG TABS tablet Take 5 mg by mouth daily. 03/10/18  Yes Wendie Agreste, MD  Insulin Detemir (LEVEMIR) 100 UNIT/ML Pen Inject 17 Units into the skin daily at 10 pm. E11.65    Patient needs appointment prior to additional refills. 06/12/18  Yes Wendie Agreste, MD  NOVOFINE 32G X 6 MM MISC Use as directed.E11.65 01/03/18  Yes Bachtel,  Audrie Lia, PA-C   Social History   Socioeconomic History  . Marital status: Married    Spouse name: Not on file  . Number of children: Not on file  . Years of education: Not on file  . Highest education level: Not on file  Occupational History  . Not on file  Social Needs  . Financial resource strain: Not on file  . Food insecurity:    Worry: Not on file    Inability: Not on file  . Transportation needs:    Medical: Not on file    Non-medical: Not on file  Tobacco Use  . Smoking status: Never Smoker  . Smokeless tobacco: Never Used  Substance and Sexual Activity  . Alcohol use: No  . Drug use: No  . Sexual activity:  Yes  Lifestyle  . Physical activity:    Days per week: Not on file    Minutes per session: Not on file  . Stress: Not on file  Relationships  . Social connections:    Talks on phone: Not on file    Gets together: Not on file    Attends religious service: Not on file    Active member of club or organization: Not on file    Attends meetings of clubs or organizations: Not on file    Relationship status: Not on file  . Intimate partner violence:    Fear of current or ex partner: Not on file    Emotionally abused: Not on file    Physically abused: Not on file    Forced sexual activity: Not on file  Other Topics Concern  . Not on file  Social History Narrative  . Not on file    Review of Systems Per HPI.     Objective:   Physical Exam Vitals signs reviewed.  Constitutional:      Appearance: He is well-developed.  HENT:     Head: Normocephalic and atraumatic.  Eyes:     Pupils: Pupils are equal, round, and reactive to light.  Neck:     Vascular: No carotid bruit or JVD.  Cardiovascular:     Rate and Rhythm: Normal rate and regular rhythm.     Heart sounds: Normal heart sounds. No murmur.  Pulmonary:     Effort: Pulmonary effort is normal.     Breath sounds: Normal breath sounds. No rales.  Skin:    General: Skin is warm and dry.  Neurological:     Mental Status: He is alert and oriented to person, place, and time.    Vitals:   07/08/18 0833 07/08/18 0955  BP: (!) 149/88 136/82  Pulse: 92   Resp: 18   Temp: 98.6 F (37 C)   TempSrc: Oral   SpO2: 99%   Weight: 216 lb 12.8 oz (98.3 kg)   Height: _0  (1.753 m)     Reports home readings 120-130/80's.   Has not been exercising - plans to restart.   Results for orders placed or performed in visit on 07/08/18  POCT glycosylated hemoglobin (Hb A1C)  Result Value Ref Range   Hemoglobin A1C 11.6 (A) 4.0 - 5.6 %   HbA1c POC (<> result, manual entry)     HbA1c, POC (prediabetic range)     HbA1c, POC (controlled  diabetic range)    POCT glucose (manual entry)  Result Value Ref Range   POC Glucose 217 (A) 70 - 99 mg/dl       Assessment & Plan:  Sean Schaefer is a 52 y.o. male Uncontrolled type 2 diabetes mellitus with hyperglycemia ([place]) - Plan: POCT glycosylated hemoglobin (Hb A1C), POCT glucose (manual entry), blood glucose meter kit and supplies Nonadherence to medication  -Identified potential barriers to care, and stressed importance of adherence to medication.  Plan to increase insulin by 2 units every 2 to 3 days until readings remain below 200.  Continue farxiga same dose for now.  New meter provided.  Recheck 3 to 4 weeks  Elevated serum creatinine - Plan: Basic metabolic panel  -Borderline elevation previously.  Discussed potential impact of uncontrolled diabetes on renal function.  Recheck BMP  Need for Tdap vaccination - Plan: Tdap vaccine greater than or equal to 7yo IM    Meds ordered this encounter  Medications  . blood glucose meter kit and supplies    Sig: Dispense based on patient and insurance preference. Use up to four times daily as directed. (FOR ICD-10 E10.9, E11.9).    Dispense:  1 each    Refill:  0    Freestyle lite, or contour next requested.    Order Specific Question:   Number of strips    Answer:   100    Order Specific Question:   Number of lancets    Answer:   100   Patient Instructions   You can check with pharmacist to see if Basaglar or Lantus will be less costly.  For now increase the Levemir by 2 units every 3 days until readings remain below 200 (then remain on that dose).   Recheck with me in 3 to 4 weeks.  If there are any barriers in your care or medication please let me know so I can try to provide assistance.  Continue Farxiga same dose for now.  If you have lab work done today you will be contacted with your lab results within the next 2 weeks.  If you have not heard from [place] then please contact us. The fastest way to get your results is to  register for My Chart.   IF you received an x-ray today, you will receive an invoice from [facility]. Please contact [facility] at [phone] with questions or concerns regarding your invoice.   IF you received labwork today, you will receive an invoice from [facility]. Please contact [facility] at [phone] with questions or concerns regarding your invoice.   Our billing staff will not be able to assist you with questions regarding bills from these companies.  You will be contacted with the lab results as soon as they are available. The fastest way to get your results is to activate your My Chart account. Instructions are located on the last page of this paperwork. If you have not heard from [place] regarding the results in 2 weeks, please contact this office.     Signed,   Merri Ray, MD [facility] [place].  07/13/18 10:19 AM

## 2018-07-08 NOTE — Patient Instructions (Addendum)
You can check with pharmacist to see if Basaglar or Lantus will be less costly.  For now increase the Levemir by 2 units every 3 days until readings remain below 200 (then remain on that dose).   Recheck with me in 3 to 4 weeks.  If there are any barriers in your care or medication please let me know so I can try to provide assistance.  Continue Farxiga same dose for now.  If you have lab work done today you will be contacted with your lab results within the next 2 weeks.  If you have not heard from [place] then please contact us. The fastest way to get your results is to register for My Chart.   IF you received an x-ray today, you will receive an invoice from [facility]. Please contact [facility] at [phone] with questions or concerns regarding your invoice.   IF you received labwork today, you will receive an invoice from [facility]. Please contact [facility] at [phone] with questions or concerns regarding your invoice.   Our billing staff will not be able to assist you with questions regarding bills from these companies.  You will be contacted with the lab results as soon as they are available. The fastest way to get your results is to activate your My Chart account. Instructions are located on the last page of this paperwork. If you have not heard from [place] regarding the results in 2 weeks, please contact this office.

## 2018-07-13 ENCOUNTER — Encounter: Payer: Self-pay | Admitting: Family Medicine

## 2018-07-20 ENCOUNTER — Encounter: Payer: Self-pay | Admitting: *Deleted

## 2018-07-29 MED FILL — AMOXICILLIN 875 MG TABS: 875 | 7 days supply | Qty: 14 | Fill #0

## 2018-07-30 MED FILL — FREESTYLE LITE TEST STRIP: 25 days supply | Qty: 100 | Fill #0

## 2018-07-30 MED FILL — FREESTYLE LANCETS: 25 days supply | Qty: 100 | Fill #0

## 2018-07-30 MED FILL — HYDROCODON-APAP 5-325: 5-325 | 2 days supply | Qty: 10 | Fill #0

## 2018-07-30 MED FILL — FREESTYLE LITE METER: 1 days supply | Qty: 1 | Fill #0

## 2018-07-31 MED FILL — ETODOLAC 400 MG TABLET: 400 | 7 days supply | Qty: 21 | Fill #0

## 2018-08-03 ENCOUNTER — Ambulatory Visit: Payer: No Typology Code available for payment source | Admitting: Family Medicine

## 2018-08-05 MED FILL — LEVEMIR FLEXTOUCH 100 UNITS: 100 | 30 days supply | Qty: 6 | Fill #0

## 2018-08-13 ENCOUNTER — Ambulatory Visit: Payer: Self-pay

## 2018-08-13 NOTE — Telephone Encounter (Signed)
Pt c/o blurred vision and dizziness since 08/02/18. Pt stated she has had elevated blood sugars in the 200-300's. Today his blood sugar was 187. Pt c/o lightheadedness which is worse when moving head. Pt stated when he was on abx recently (for an abscessed tooth) was vomiting and not eating well and stopped taking his insulin. Discussed sick day rules with patient. Care advice given and pt verbalized understanding. No appointments available for today or tomorrow. Pt advised to go to [facility] and to call his eye doctor.  Reason for Disposition . [1] MILD dizziness (e.g., walking normally) AND [2] has NOT been evaluated by physician for this  (Exception: dizziness caused by heat exposure, sudden standing, or poor fluid intake)  Answer Assessment - Initial Assessment Questions 1. DESCRIPTION: "Describe your dizziness."     Off balance 2. LIGHTHEADED: "Do you feel lightheaded?" (e.g., somewhat faint, woozy, weak upon standing)     no 3. VERTIGO: "Do you feel like either you or the room is spinning or tilting?" (i.e. vertigo)     no 4. SEVERITY: "How bad is it?"  "Do you feel like you are going to faint?" "Can you stand and walk?"   - MILD - walking normally   - MODERATE - interferes with normal activities (e.g., work, school)    - SEVERE - unable to stand, requires support to walk, feels like passing out now.      mild 5. ONSET:  "When did the dizziness begin?"     08/02/18 6. AGGRAVATING FACTORS: "Does anything make it worse?" (e.g., standing, change in head position)     Moving head 7. HEART RATE: "Can you tell me your heart rate?" "How many beats in 15 seconds?"  (Note: not all patients can do this)       Pt cannot do this  8. CAUSE: "What do you think is causing the dizziness?"     High blood sugar 9. RECURRENT SYMPTOM: "Have you had dizziness before?" If so, ask: "When was the last time?" "What happened that time?"     no 10. OTHER SYMPTOMS: "Do you have any other symptoms?" (e.g., fever, chest  pain, vomiting, diarrhea, bleeding)       Blurred vision to left eye,  11. PREGNANCY: "Is there any chance you are pregnant?" "When was your last menstrual period?"       n/a  Protocols used: DIZZINESS [facility]

## 2018-08-14 ENCOUNTER — Ambulatory Visit (HOSPITAL_COMMUNITY)
Admission: EM | Admit: 2018-08-14 | Discharge: 2018-08-14 | Disposition: A | Discharge status: Home / Self Care | Payer: No Typology Code available for payment source | Source: Home / Self Care | Attending: Family Medicine | Admitting: Family Medicine

## 2018-08-14 ENCOUNTER — Emergency Department (HOSPITAL_COMMUNITY)
Admission: EM | Admit: 2018-08-14 | Discharge: 2018-08-14 | Disposition: A | Discharge status: Home / Self Care | Payer: No Typology Code available for payment source | Attending: Emergency Medicine | Admitting: Emergency Medicine

## 2018-08-14 ENCOUNTER — Other Ambulatory Visit: Payer: Self-pay

## 2018-08-14 ENCOUNTER — Encounter (HOSPITAL_COMMUNITY): Payer: Self-pay | Admitting: Emergency Medicine

## 2018-08-14 DIAGNOSIS — R51 Headache: Secondary | ICD-10-CM | POA: Diagnosis not present

## 2018-08-14 DIAGNOSIS — E119 Type 2 diabetes mellitus without complications: Secondary | ICD-10-CM | POA: Insufficient documentation

## 2018-08-14 DIAGNOSIS — E860 Dehydration: Secondary | ICD-10-CM | POA: Insufficient documentation

## 2018-08-14 DIAGNOSIS — R42 Dizziness and giddiness: Secondary | ICD-10-CM | POA: Insufficient documentation

## 2018-08-14 DIAGNOSIS — Z794 Long term (current) use of insulin: Secondary | ICD-10-CM | POA: Insufficient documentation

## 2018-08-14 DIAGNOSIS — R739 Hyperglycemia, unspecified: Secondary | ICD-10-CM | POA: Insufficient documentation

## 2018-08-14 DIAGNOSIS — I951 Orthostatic hypotension: Secondary | ICD-10-CM

## 2018-08-14 DIAGNOSIS — Z79899 Other long term (current) drug therapy: Secondary | ICD-10-CM | POA: Insufficient documentation

## 2018-08-14 DIAGNOSIS — R110 Nausea: Secondary | ICD-10-CM | POA: Diagnosis present

## 2018-08-14 DIAGNOSIS — R000 Tachycardia, unspecified: Secondary | ICD-10-CM | POA: Diagnosis not present

## 2018-08-14 DIAGNOSIS — I10 Essential (primary) hypertension: Secondary | ICD-10-CM | POA: Insufficient documentation

## 2018-08-14 LAB — CBG MONITORING, ED
GLUCOSE-CAPILLARY: 212 mg/dL — AB (ref 70–99)
Glucose-Capillary: 336 mg/dL — ABNORMAL HIGH (ref 70–99)

## 2018-08-14 LAB — URINALYSIS, ROUTINE W REFLEX MICROSCOPIC
Bilirubin Urine: NEGATIVE
Glucose, UA: >=500 mg/dL — AB
HGB URINE DIPSTICK: NEGATIVE
Ketones, ur: 20 mg/dL — AB
Leukocytes, UA: NEGATIVE
Nitrite: NEGATIVE
Protein, ur: NEGATIVE mg/dL
Specific Gravity, Urine: 1.026 (ref 1.005–1.030)
pH: 5 (ref 5.0–8.0)

## 2018-08-14 LAB — BASIC METABOLIC PANEL
Anion gap: 12 (ref 5–15)
BUN: 21 mg/dL — AB (ref 6–20)
CO2: 22 mmol/L (ref 22–32)
Calcium: 9.3 mg/dL (ref 8.9–10.3)
Chloride: 99 mmol/L (ref 98–111)
Creatinine, Ser: 1.4 mg/dL — ABNORMAL HIGH (ref 0.61–1.24)
GFR calc Af Amer: >60 mL/min (ref >60)
GFR calc non Af Amer: 58 mL/min — ABNORMAL LOW (ref >60)
Glucose, Bld: 315 mg/dL — ABNORMAL HIGH (ref 70–99)
Potassium: 4.1 mmol/L (ref 3.5–5.1)
Sodium: 133 mmol/L — ABNORMAL LOW (ref 135–145)

## 2018-08-14 LAB — CBC
HCT: 51.4 % (ref 39.0–52.0)
Hemoglobin: 17.1 g/dL — ABNORMAL HIGH (ref 13.0–17.0)
MCH: 28.5 pg (ref 26.0–34.0)
MCHC: 33.3 g/dL (ref 30.0–36.0)
MCV: 85.8 fL (ref 80.0–100.0)
Platelets: 243 10*3/uL (ref 150–400)
RBC: 5.99 MIL/uL — AB (ref 4.22–5.81)
RDW: 12.3 % (ref 11.5–15.5)
WBC: 11.8 10*3/uL — ABNORMAL HIGH (ref 4.0–10.5)
nRBC: 0 % (ref 0.0–0.2)

## 2018-08-14 LAB — GLUCOSE, CAPILLARY: Glucose-Capillary: 292 mg/dL — ABNORMAL HIGH (ref 70–99)

## 2018-08-14 MED ORDER — INSULIN ASPART 100 UNIT/ML ~~LOC~~ SOLN
8.0000 [IU] | Freq: Once | SUBCUTANEOUS | Status: AC
  Administered 2018-08-14: 8 [IU] via SUBCUTANEOUS

## 2018-08-14 MED ORDER — SODIUM CHLORIDE 0.9 % IV BOLUS
1000.0000 mL | Freq: Once | INTRAVENOUS | Status: AC
  Administered 2018-08-14: 1000 mL via INTRAVENOUS

## 2018-08-14 NOTE — Discharge Instructions (Addendum)
It was our pleasure to provide your ER care today - we hope that you feel better.  Rest. Drink plenty of fluids. Follow diabetic eating plan. Continue diabetic medications, and check sugars 4x/day and record values.   Follow up with primary care doctor in the next few days for recheck.  Return to ER if worse, new symptoms, fevers, new or severe pain, weak/fainting, severe dizziness, or other concern.

## 2018-08-14 NOTE — ED Provider Notes (Addendum)
[facility] EMERGENCY DEPARTMENT Provider Note   CSN: [id number] Arrival date & time: 08/14/18  1313     History   Chief Complaint Chief Complaint  Patient presents with  . Dizziness  . Tachycardia    HPI Sean Schaefer is a 53 y.o. male.  Patient with hx iddm, c/o recent nausea, lightheadedness esp when stands quickly, for the past week. States symptoms had begun a week ago when taking amoxicillin and having root canal procedure. He indicates now he has finished the course of antibiotic, but symptoms persist. Martin Majestic to urgent care today, and they sent patient to the ED. Pt symptoms gradual onset, constant, persistent, worse w abrupt change in position/standing. No loss of balance or falls. No numbness/weakness. No change in speech or vision. Denies polyuria or polydipsia. Indicates compliant w home meds. Denies fevers or uri symptoms. No ear pain, hearing loss or tinnitus.   The history is provided by the patient.  Dizziness  Associated symptoms: no chest pain, no diarrhea, no headaches, no palpitations, no shortness of breath, no tinnitus and no vomiting     Past Medical History:  Diagnosis Date  . Diabetes mellitus without complication ([place])   . Hypertension   . Ulcerative colitis [facility])     Patient Active Problem List   Diagnosis Date Noted  . 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 Diabetes ([place]) 12/26/2014  . 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 Crohn's disease ([place]) 12/18/2013    Past Surgical History:  Procedure Laterality Date  . COLON SURGERY    . Perianal fistula repair  2012        Home Medications    Prior to Admission medications   Medication Sig Start Date End Date Taking? Authorizing Provider  blood glucose meter kit and supplies Dispense based on patient and insurance preference. Use up to four times daily as directed. (FOR ICD-10 E10.9, E11.9). 07/08/18   Wendie Agreste, MD  dapagliflozin propanediol  (FARXIGA) 5 MG TABS tablet Take 5 mg by mouth daily. 03/10/18   Wendie Agreste, MD  Insulin Detemir (LEVEMIR) 100 UNIT/ML Pen Inject 17 Units into the skin daily at 10 pm. E11.65    Patient needs appointment prior to additional refills. 06/12/18   Wendie Agreste, MD  NOVOFINE 32G X 6 MM MISC Use as directed.E11.65 01/03/18   Tereasa Coop, PA-C    Family History Family History  Problem Relation Age of Onset  . Hypertension Mother   . Cancer Mother   . Hypertension Father   . Diabetes Paternal Grandmother     Social History Social History   Tobacco Use  . Smoking status: Never Smoker  . Smokeless tobacco: Never Used  Substance Use Topics  . Alcohol use: No  . Drug use: No     Allergies   Patient has no known allergies.   Review of Systems Review of Systems  Constitutional: Negative for fever.  HENT: Negative for ear pain, sore throat and tinnitus.   Eyes: Negative for pain, redness and visual disturbance.  Respiratory: Negative for cough and shortness of breath.   Cardiovascular: Negative for chest pain and palpitations.  Gastrointestinal: Negative for abdominal pain, diarrhea and vomiting.  Endocrine: Negative for polyuria.  Genitourinary: Negative for dysuria and flank pain.  Musculoskeletal: Negative for back pain and neck pain.  Skin: Negative for color change and rash.  Neurological: Positive for dizziness and light-headedness. Negative for syncope and headaches.  Hematological: Does not  bruise/bleed easily.  Psychiatric/Behavioral: Negative for confusion.  All other systems reviewed and are negative.    Physical Exam Updated Vital Signs Ht 1.778 m (_0 )   Wt 98 kg   BMI 30.99 kg/m   Physical Exam Vitals signs and nursing note reviewed.  Constitutional:      Appearance: Normal appearance. He is well-developed.  HENT:     Head: Atraumatic.     Right Ear: Tympanic membrane normal.     Left Ear: Tympanic membrane normal.     Nose: Nose  normal.     Mouth/Throat:     Mouth: Mucous membranes are moist.     Pharynx: Oropharynx is clear.     Comments: No trismus. No dental abscess noted. Pharynx normal.  Eyes:     General: No scleral icterus.    Conjunctiva/sclera: Conjunctivae normal.     Pupils: Pupils are equal, round, and reactive to light.  Neck:     Musculoskeletal: Normal range of motion and neck supple. No neck rigidity or muscular tenderness.     Vascular: No carotid bruit.     Trachea: No tracheal deviation.  Cardiovascular:     Rate and Rhythm: Normal rate and regular rhythm.     Pulses: Normal pulses.     Heart sounds: Normal heart sounds. No murmur. No friction rub. No gallop.   Pulmonary:     Effort: Pulmonary effort is normal. No accessory muscle usage or respiratory distress.     Breath sounds: Normal breath sounds.  Abdominal:     General: Bowel sounds are normal. There is no distension.     Palpations: Abdomen is soft.     Tenderness: There is no abdominal tenderness. There is no guarding.  Genitourinary:    Comments: No cva tenderness. Musculoskeletal:        General: No swelling.  Skin:    General: Skin is warm and dry.     Findings: No rash.  Neurological:     Mental Status: He is alert.     Comments: Alert, speech clear/fluent. Motor/sens grossly intact bil. Steady gait.   Psychiatric:        Mood and Affect: Mood normal.      ED Treatments / Results  Labs (all labs ordered are listed, but only abnormal results are displayed) Results for orders placed or performed during the hospital encounter of 08/14/18  CBG monitoring, ED  Result Value Ref Range   Glucose-Capillary 336 (H) 70 - 99 mg/dL   Comment 1 Notify RN    Comment 2 Document in Chart     EKG EKG Interpretation  Date/Time:  Friday August 14 2018 13:15:56 EST Ventricular Rate:  138 PR Interval:  136 QRS Duration: 72 QT Interval:  276 QTC Calculation: 418 R Axis:   38 Text Interpretation:  Sinus tachycardia  Nonspecific T wave abnormality Confirmed by Lajean Saver [phone]) on 08/14/2018 1:26:35 PM   Radiology No results found.  Procedures Procedures (including critical care time)  Medications Ordered in ED Medications  sodium chloride 0.9 % bolus 1,000 mL (has no administration in time range)     Initial Impression / Assessment and Plan / ED Course  I have reviewed the triage vital signs and the nursing notes.  Pertinent labs & imaging results that were available during my care of the patient were reviewed by me and considered in my medical decision making (see chart for details).  Iv ns bolus. Labs.  Reviewed nursing notes and prior charts for  additional history. Pt had labs sent from urgent care earlier. Labs reviewed - hgb normal. Glu elev.   From outside labs, glucose high 300s, mild AKI compared to baseline, hc03 normal. Labs felt c/w volume depletion/dehydration, not dka. Additional ivf.   novolog sq.   Po fluids. Orthostatics. Ambulate in hall.   Pt ambulates in ED with steady gait, no ataxia, no imbalance.   Tolerating po fluids. Glucose improved.   Pt feels improved, and currently appears stable for d/c.  Return precautions provided.     Final Clinical Impressions(s) / ED Diagnoses   Final diagnoses:  None    ED Discharge Orders    None         Lajean Saver, MD 08/14/18 [phone]

## 2018-08-14 NOTE — Discharge Instructions (Signed)
Please go to emergency room for further evaluation

## 2018-08-14 NOTE — ED Notes (Signed)
Pt verbalized understanding of discharge paperwork and follow-up care.  °

## 2018-08-14 NOTE — ED Triage Notes (Signed)
Sent here from urgent care with c/o dizziness since taking amoxicillin after a root canal- has headache over left side of head- root canal on left side.

## 2018-08-14 NOTE — ED Triage Notes (Signed)
Pt states hes feeling dizzy, states hes having trouble keeping his blood sugar under control. States last reading was 261 at 1030. Pt states on new years he had an abscess tooth and didn't get antibiotics. States two weeks ago he had a root canal, states he was given an antibiotic and his blood sugar was elevated since then.

## 2018-08-14 NOTE — ED Provider Notes (Addendum)
[place]    CSN: [id number] Arrival date & time: 08/14/18  1124     History   Chief Complaint Chief Complaint  Patient presents with  . Appointment    1145  . Dizziness  . Hyperglycemia    HPI Sean Schaefer is a 53 y.o. male history of hypertension, DM type II uncontrolled, ulcerative colitis presenting today for evaluation of dizziness.  Patient states that for the past couple of weeks he has had dizziness, feeling off balance as well as a headache mainly located on the left side of his head near his eye with associated blurry vision.  States that symptoms initially began approximately 2 weeks ago when he had a dental abscess on his left upper jaw.  He was seen by dentistry had a root canal and initiated on amoxicillin 875 twice daily.  After starting this he began to have nausea and vomiting and had difficulty keeping anything down.  He finished antibiotic last Wednesday and since last Friday has been able to keep food down.  He since has had persistent dizziness, and has not had any improvement.  He is also had associated fatigue.  He notes that his blood sugars have been ranging from 2 62-300s.  With review of his glucometer it appears that his blood sugars have been elevated previous to onset of his symptoms extending into early December.  States dizziness does worsen with movements; but denies room spinning sensation.  Denies previous DVT/PE.  Denies recent travel or immobilization.  Denies smoking history.  Denies leg pain or leg swelling.  HPI  Past Medical History:  Diagnosis Date  . Diabetes mellitus without complication ([place])   . Hypertension   . Ulcerative colitis [facility])     Patient Active Problem List   Diagnosis Date Noted  . 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 Diabetes ([place]) 12/26/2014  . 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 Crohn's disease ([place]) 12/18/2013    Past Surgical History:  Procedure Laterality Date  . COLON  SURGERY    . Perianal fistula repair  2012       Home Medications    Prior to Admission medications   Medication Sig Start Date End Date Taking? Authorizing Provider  blood glucose meter kit and supplies Dispense based on patient and insurance preference. Use up to four times daily as directed. (FOR ICD-10 E10.9, E11.9). 07/08/18   Wendie Agreste, MD  dapagliflozin propanediol (FARXIGA) 5 MG TABS tablet Take 5 mg by mouth daily. 03/10/18   Wendie Agreste, MD  Insulin Detemir (LEVEMIR) 100 UNIT/ML Pen Inject 17 Units into the skin daily at 10 pm. E11.65    Patient needs appointment prior to additional refills. 06/12/18   Wendie Agreste, MD  NOVOFINE 32G X 6 MM MISC Use as directed.E11.65 01/03/18   Tereasa Coop, PA-C    Family History Family History  Problem Relation Age of Onset  . Hypertension Mother   . Cancer Mother   . Hypertension Father   . Diabetes Paternal Grandmother     Social History Social History   Tobacco Use  . Smoking status: Never Smoker  . Smokeless tobacco: Never Used  Substance Use Topics  . Alcohol use: No  . Drug use: No     Allergies   Patient has no known allergies.   Review of Systems Review of Systems  Constitutional: Negative for fatigue and fever.  HENT: Negative for congestion, sinus pressure and sore throat.  Eyes: Positive for visual disturbance. Negative for photophobia and pain.  Respiratory: Negative for cough and shortness of breath.   Cardiovascular: Negative for chest pain.  Gastrointestinal: Negative for abdominal pain, nausea and vomiting.  Genitourinary: Negative for decreased urine volume and hematuria.  Musculoskeletal: Negative for myalgias, neck pain and neck stiffness.  Neurological: Positive for dizziness, light-headedness and headaches. Negative for syncope, facial asymmetry, speech difficulty, weakness and numbness.     Physical Exam Triage Vital Signs ED Triage Vitals  Enc Vitals Group     BP  08/14/18 1149 (!) 137/96     Pulse Rate 08/14/18 1149 (!) 126     Resp 08/14/18 1149 18     Temp 08/14/18 1149 98.4 F (36.9 C)     Temp src --      SpO2 08/14/18 1149 99 %     Weight --      Height --      Head Circumference --      Peak Flow --      Pain Score 08/14/18 1151 0     Pain Loc --      Pain Edu? --      Excl. in [place]? --    Orthostatic VS for the past 24 hrs:  BP- Lying Pulse- Lying BP- Sitting Pulse- Sitting BP- Standing at 0 minutes Pulse- Standing at 0 minutes  08/14/18 1253 117/75 124 136/80 142 104/78 146    Updated Vital Signs BP (!) 137/96   Pulse (!) 126   Temp 98.4 F (36.9 C)   Resp 18   SpO2 99%   Visual Acuity Right Eye Distance:   Left Eye Distance:   Bilateral Distance:    Right Eye Near: R Near: 20/25 Left Eye Near:  L Near: 20/25 Bilateral Near:  20/25  Physical Exam Vitals signs and nursing note reviewed.  Constitutional:      Appearance: He is well-developed.  HENT:     Head: Normocephalic and atraumatic.     Mouth/Throat:     Comments: Oral mucosa pink and moist, no tonsillar enlargement or exudate. Posterior pharynx patent and nonerythematous, no uvula deviation or swelling. Normal phonation. Eyes:     Extraocular Movements: Extraocular movements intact.     Conjunctiva/sclera: Conjunctivae normal.     Pupils: Pupils are equal, round, and reactive to light.     Comments: Wearing glasses  Neck:     Musculoskeletal: Neck supple.  Cardiovascular:     Rate and Rhythm: Regular rhythm. Tachycardia present.     Heart sounds: No murmur.  Pulmonary:     Effort: Pulmonary effort is normal. No respiratory distress.     Breath sounds: Normal breath sounds.     Comments: Breathing comfortably at rest, CTABL, no wheezing, rales or other adventitious sounds auscultated Abdominal:     Palpations: Abdomen is soft.     Tenderness: There is no abdominal tenderness.  Skin:    General: Skin is warm and dry.  Neurological:     General: No  focal deficit present.     Mental Status: He is alert and oriented to person, place, and time.     Comments: Patient A&O x3, cranial nerves II-XII grossly intact, strength at shoulders, hips and knees 5/5, equal bilaterally, patellar reflex 2+ bilaterally.. Gait without abnormality.  Negative Dix-Hallpike.      UC Treatments / Results  Labs (all labs ordered are listed, but only abnormal results are displayed) Labs Reviewed  GLUCOSE, CAPILLARY - Abnormal; Notable  for the following components:      Result Value   Glucose-Capillary 292 (*)    All other components within normal limits  CBC  BASIC METABOLIC PANEL  CBG MONITORING, ED    EKG None  Radiology No results found.  Procedures Procedures (including critical care time)  Medications Ordered in UC Medications - No data to display  Initial Impression / Assessment and Plan / UC Course  I have reviewed the triage vital signs and the nursing notes.  Pertinent labs & imaging results that were available during my care of the patient were reviewed by me and considered in my medical decision making (see chart for details).    Patient tachycardic, with dizziness and headache.  EKG sinus tach. Blood sugar 262 in clinic today, seems stable from his previous blood sugars over the past couple months. Although. Not suggestive of DKA.  Patient did have a week of nausea and vomiting, blood pressure did drop moving from sitting to standing.  UA unable to obtain as patient recently used the restroom.  Given his orthostasis, significant tachycardia will recommend further evaluation and treatment in the emergency room.  Patient likely will need fluids, but given tachycardia concerned that something else may be going on besides dehydration.  CBC and BMP obtained, results pending.  Patient wife verbalized understanding.Discussed strict return precautions. Patient verbalized understanding and is agreeable with plan.  Final Clinical Impressions(s)  / UC Diagnoses   Final diagnoses:  Dizziness  Orthostatic hypotension     Discharge Instructions     Please go to emergency room for further evaluation   ED Prescriptions    None     Controlled Substance Prescriptions [place] Controlled Substance Registry consulted? Not Applicable   Janith Lima, PA-C 08/14/18 [address] [place], [place] 08/14/18 1313

## 2018-08-17 ENCOUNTER — Telehealth: Payer: Self-pay | Admitting: Endocrinology

## 2018-08-17 NOTE — Telephone Encounter (Signed)
Spoke with patients wife Velva Harman). Patient was in the ED and released and was advised to see an endocrinologist. Message has been routed to Dr. Dwyane Dee for the o.k.

## 2018-08-24 ENCOUNTER — Ambulatory Visit (INDEPENDENT_AMBULATORY_CARE_PROVIDER_SITE_OTHER): Payer: No Typology Code available for payment source | Admitting: Family Medicine

## 2018-08-24 VITALS — BP 162/82 | HR 100 | Temp 98.6°F | Resp 16 | Ht 70.0 in | Wt 221.0 lb

## 2018-08-24 DIAGNOSIS — K50119 Crohn's disease of large intestine with unspecified complications: Secondary | ICD-10-CM

## 2018-08-24 DIAGNOSIS — E1165 Type 2 diabetes mellitus with hyperglycemia: Secondary | ICD-10-CM | POA: Diagnosis not present

## 2018-08-24 DIAGNOSIS — K6289 Other specified diseases of anus and rectum: Secondary | ICD-10-CM

## 2018-08-24 DIAGNOSIS — R7989 Other specified abnormal findings of blood chemistry: Secondary | ICD-10-CM | POA: Diagnosis not present

## 2018-08-24 LAB — GLUCOSE, POCT (MANUAL RESULT ENTRY): POC Glucose: 265 mg/dl — AB (ref 70–99)

## 2018-08-24 NOTE — Progress Notes (Signed)
Subjective:    Patient ID: Sean Schaefer, male    DOB: 08-09-1965, 53 y.o.   MRN: [id number]  HPI Sean Schaefer is a 53 y.o. male Presents today for: Chief Complaint  Patient presents with  . Diabetes    recheck  . Referral    endocrinology   Here for follow-up of diabetes.  Last seen December 18.  See details at that visit.  Plan to increase his insulin by 2 units every 2 to 3 days until his readings below 200, continued on Farxiga.   Had dental abscess treated around New years.  Had root canal, then started on amoxicillin.  Had N/V after starting the amoxicillin, no pain meds. tx with tylenol/advil/alleve. vomited the codeine.  Dizzy spells. Had elevated readings in 200-300's. Abscess did improve with amoxicillin, no current dental abscess.   Seen by Urgent care 1/24 - glucose 292, WBC 11.8. dizzy, tachycardic, HA.  Sent to ER. Glucose 336, EKG sinus tach, nonspecific twave abnorm. Mild AKI bicarrb normal. Suspected volume depletion, treated with IVF. Improved with IVF, 8 units insulin.   Restarted insulin in November prior to last OV.  Has been still on Levemir - on 23 units at night.   Fasting initially after hospital 160-180's, then started to go back up this week to 200's Fasting in 200 today, 160-180 fasting last week.  2 hr PP 224 today  Last [place] today.   Sore in rectal area - thinks reason for elevated blood pressure - usually 132/80.  Declines exam of that area today.  I also discussed medications help treat pain, in which she noted nothing works and declined any medication.  Additionally discussed concern for persistent or worsening infection and potential need for antibiotics due to recent experience with amoxicillin also declined antibiotics at this time.   Rectal discharge for years ulcerative colitis and then Crohns. 19 different surgeries. Noted pus recently - has mass around anus that has to pop daily - for years.  More inflamed recently since early January. No  recent fever. 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   Had multiple intestinal surgeries.  Has not had recent follow up with GI or rectal surgeon - about 5 years ago. Refuses exam by me today, but agrees to meet with general surgery. reports total colectomy in past - here and [facility] when younger. Prior on Humira, stopped as not helping.  Lab Results  Component Value Date   HGBA1C 11.6 (A) 07/08/2018   HGBA1C 10.4 (H) 02/04/2018   HGBA1C 10.8 (A) 01/03/2018   Lab Results  Component Value Date   LDLCALC 105 (H) 02/04/2018   CREATININE 1.40 (H) 08/14/2018    Patient Active Problem List   Diagnosis Date Noted  . 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 Diabetes ([place]) 12/26/2014  . 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 Crohn's disease ([place]) 12/18/2013   Past Medical History:  Diagnosis Date  . Diabetes mellitus without complication ([place])   . Hypertension   . Ulcerative colitis [facility])    Past Surgical History:  Procedure Laterality Date  . COLON SURGERY    . Perianal fistula repair  2012   No Known Allergies Prior to Admission medications   Medication Sig Start Date End Date Taking? Authorizing Provider  blood glucose meter kit and supplies Dispense based on patient and insurance preference. Use up to four times daily as directed. (FOR ICD-10 E10.9, E11.9). 07/08/18  Yes  Wendie Agreste, MD  dapagliflozin propanediol (FARXIGA) 5 MG TABS tablet Take 5 mg by mouth daily. 03/10/18  Yes Wendie Agreste, MD  Insulin Detemir (LEVEMIR) 100 UNIT/ML Pen Inject 17 Units into the skin daily at 10 pm. E11.65    Patient needs appointment prior to additional refills. 06/12/18  Yes Wendie Agreste, MD  NOVOFINE 32G X 6 MM MISC Use as directed.E11.65 01/03/18  Yes Tereasa Coop, PA-C   Social History   Socioeconomic History  . Marital status: Married    Spouse name: Not on file  . Number of children: Not on file  . Years of  education: Not on file  . Highest education level: Not on file  Occupational History  . Not on file  Social Needs  . Financial resource strain: Not on file  . Food insecurity:    Worry: Not on file    Inability: Not on file  . Transportation needs:    Medical: Not on file    Non-medical: Not on file  Tobacco Use  . Smoking status: Never Smoker  . Smokeless tobacco: Never Used  Substance and Sexual Activity  . Alcohol use: No  . Drug use: No  . Sexual activity: Yes  Lifestyle  . Physical activity:    Days per week: Not on file    Minutes per session: Not on file  . Stress: Not on file  Relationships  . Social connections:    Talks on phone: Not on file    Gets together: Not on file    Attends religious service: Not on file    Active member of club or organization: Not on file    Attends meetings of clubs or organizations: Not on file    Relationship status: Not on file  . Intimate partner violence:    Fear of current or ex partner: Not on file    Emotionally abused: Not on file    Physically abused: Not on file    Forced sexual activity: Not on file  Other Topics Concern  . Not on file  Social History Narrative  . Not on file    Review of Systems Per HPI    Objective:   Physical Exam Vitals:   08/24/18 1528 08/24/18 1546  BP: (!) 173/105 (!) 162/82  Pulse: 100   Resp: 16   Temp: 98.6 F (37 C)   TempSrc: Oral   SpO2: 97%   Weight: 221 lb (100.2 kg)   Height: 5' 10"  (1.778 m)     Results for orders placed or performed in visit on 82/95/62  Basic metabolic panel  Result Value Ref Range   Glucose 96 65 - 99 mg/dL   BUN 14 6 - 24 mg/dL   Creatinine, Ser 0.97 0.76 - 1.27 mg/dL   GFR calc non Af Amer 91 >59 mL/min/1.73   GFR calc Af Amer 105 >59 mL/min/1.73   BUN/Creatinine Ratio 14 9 - 20   Sodium 139 134 - 144 mmol/L   Potassium 3.4 (L) 3.5 - 5.2 mmol/L   Chloride 99 96 - 106 mmol/L   CO2 24 20 - 29 mmol/L   Calcium 9.8 8.7 - 10.2 mg/dL  CBC    Result Value Ref Range   WBC 6.1 3.4 - 10.8 x10E3/uL   RBC 5.17 4.14 - 5.80 x10E6/uL   Hemoglobin 14.7 13.0 - 17.7 g/dL   Hematocrit 43.8 37.5 - 51.0 %   MCV 85 79 - 97 fL   MCH  28.4 26.6 - 33.0 pg   MCHC 33.6 31.5 - 35.7 g/dL   RDW 12.1 11.6 - 15.4 %   Platelets 244 150 - 450 x10E3/uL  POCT glucose (manual entry)  Result Value Ref Range   POC Glucose 265 (A) 70 - 99 mg/dl  last ate at 1pm.      Assessment & Plan:  Sean Schaefer is a 54 y.o. male Uncontrolled type 2 diabetes mellitus with hyperglycemia ([place]) - Plan: POCT glucose (manual entry), Ambulatory referral to Endocrinology  -Uncontrolled.  Difficulty with control may be related to recent infection treatment.  Initially can be related to his perirectal disease but exam was declined at present.  -Increase basal insulin to 25 units per night, then can increase additional 2 units every  2 to 3 days until fastings below 200.  Hypoglycemic precautions given.  Continue Farxiga same dose for now.  Has appointment scheduled with endocrinologist, referral placed.  Keep follow-up with endocrine.  Crohn's disease of colon with complication ([place]) - Plan: Ambulatory referral to General Surgery, CBC Rectal pain  -Unfortunately has had significant issues with perianal disease.  Had a total proctocolectomy and J-pouch at the [facility] when he was 53 years old.  Reportedly multiple abscesses over the years.  On further chart review he was seen by Dr. Marcello Moores at [facility] surgery in June 2014, but not since that time.  -Afebrile, but does report pain contributing to elevated blood pressures above.  Exam of affected area was declined.  -Refer to general surgeon, CBC obtained without leukocytosis, afebrile.  RTC/ER precautions discussed.   Elevated serum creatinine - Plan: Basic metabolic panel  -Repeat testing.  Noted elevated blood pressure above reportedly from pain at rectal area.  I did discuss pain medication options temporarily  but those were also declined.  Follow-up blood pressure at endocrinology appointment.,  But also RTC precautions discussed if remaining over 140/90 at home.    No orders of the defined types were placed in this encounter.  Patient Instructions   Keep a record of your blood pressures outside of the office and if they remain over 140/90, please return for recheck.  Increase insulin to 25 units per night and watch blood sugar readings.  In the next 2 to 3 days if those remain over 200 fasting then increase by another 2 units.  No change in farxiga dose for now.  Keep follow-up with endocrinologist as planned.  I did place a referral as we discussed.  I have placed a referral to the surgeon for the rectal abscess/issues.  As we discussed if there are any acutely worsening symptoms or fever, presented to the emergency room immediately as you may need to be on antibiotics or need other acute treatment.  Return to the clinic or go to the nearest emergency room if any of your symptoms worsen or new symptoms occur.      If you have lab work done today you will be contacted with your lab results within the next 2 weeks.  If you have not heard from [place] then please contact us. The fastest way to get your results is to register for My Chart.   IF you received an x-ray today, you will receive an invoice from [facility]. Please contact [facility] at [phone] with questions or concerns regarding your invoice.   IF you received labwork today, you will receive an invoice from [place]. Please contact [facility] at [phone] with questions or concerns regarding your invoice.  Our billing staff will not be able to assist you with questions regarding bills from these companies.  You will be contacted with the lab results as soon as they are available. The fastest way to get your results is to activate your My Chart account. Instructions are located on the last page of this paperwork.  If you have not heard from [place] regarding the results in 2 weeks, please contact this office.      Signed,   Merri Ray, MD [facility] [place].  08/27/18 9:45 PM

## 2018-08-24 NOTE — Patient Instructions (Addendum)
Keep a record of your blood pressures outside of the office and if they remain over 140/90, please return for recheck.  Increase insulin to 25 units per night and watch blood sugar readings.  In the next 2 to 3 days if those remain over 200 fasting then increase by another 2 units.  No change in farxiga dose for now.  Keep follow-up with endocrinologist as planned.  I did place a referral as we discussed.  I have placed a referral to the surgeon for the rectal abscess/issues.  As we discussed if there are any acutely worsening symptoms or fever, presented to the emergency room immediately as you may need to be on antibiotics or need other acute treatment.  Return to the clinic or go to the nearest emergency room if any of your symptoms worsen or new symptoms occur.      If you have lab work done today you will be contacted with your lab results within the next 2 weeks.  If you have not heard from [place] then please contact us. The fastest way to get your results is to register for My Chart.   IF you received an x-ray today, you will receive an invoice from [facility]. Please contact [facility] at [phone] with questions or concerns regarding your invoice.   IF you received labwork today, you will receive an invoice from [place]. Please contact [facility] at [phone] with questions or concerns regarding your invoice.   Our billing staff will not be able to assist you with questions regarding bills from these companies.  You will be contacted with the lab results as soon as they are available. The fastest way to get your results is to activate your My Chart account. Instructions are located on the last page of this paperwork. If you have not heard from [place] regarding the results in 2 weeks, please contact this office.

## 2018-08-25 LAB — CBC
Hematocrit: 43.8 % (ref 37.5–51.0)
Hemoglobin: 14.7 g/dL (ref 13.0–17.7)
MCH: 28.4 pg (ref 26.6–33.0)
MCHC: 33.6 g/dL (ref 31.5–35.7)
MCV: 85 fL (ref 79–97)
PLATELETS: 244 10*3/uL (ref 150–450)
RBC: 5.17 x10E6/uL (ref 4.14–5.80)
RDW: 12.1 % (ref 11.6–15.4)
WBC: 6.1 10*3/uL (ref 3.4–10.8)

## 2018-08-25 LAB — BASIC METABOLIC PANEL
BUN/Creatinine Ratio: 14 (ref 9–20)
BUN: 14 mg/dL (ref 6–24)
CO2: 24 mmol/L (ref 20–29)
Calcium: 9.8 mg/dL (ref 8.7–10.2)
Chloride: 99 mmol/L (ref 96–106)
Creatinine, Ser: 0.97 mg/dL (ref 0.76–1.27)
GFR calc Af Amer: 105 mL/min/{1.73_m2} (ref >59)
GFR calc non Af Amer: 91 mL/min/{1.73_m2} (ref >59)
Glucose: 96 mg/dL (ref 65–99)
Potassium: 3.4 mmol/L — ABNORMAL LOW (ref 3.5–5.2)
Sodium: 139 mmol/L (ref 134–144)

## 2018-08-27 ENCOUNTER — Encounter: Payer: Self-pay | Admitting: Family Medicine

## 2018-08-28 MED FILL — LEVEMIR FLEXTOUCH 100 UNITS: 100 | 30 days supply | Qty: 6 | Fill #1

## 2018-09-01 ENCOUNTER — Ambulatory Visit (INDEPENDENT_AMBULATORY_CARE_PROVIDER_SITE_OTHER): Payer: No Typology Code available for payment source | Admitting: Endocrinology

## 2018-09-01 ENCOUNTER — Encounter: Payer: Self-pay | Admitting: Endocrinology

## 2018-09-01 VITALS — BP 134/88 | HR 116 | Ht 70.0 in | Wt 216.6 lb

## 2018-09-01 DIAGNOSIS — I10 Essential (primary) hypertension: Secondary | ICD-10-CM

## 2018-09-01 DIAGNOSIS — E1165 Type 2 diabetes mellitus with hyperglycemia: Secondary | ICD-10-CM | POA: Diagnosis not present

## 2018-09-01 DIAGNOSIS — E782 Mixed hyperlipidemia: Secondary | ICD-10-CM

## 2018-09-01 DIAGNOSIS — Z794 Long term (current) use of insulin: Secondary | ICD-10-CM | POA: Diagnosis not present

## 2018-09-01 MED ORDER — INSULIN LISPRO (1 UNIT DIAL) 100 UNIT/ML (KWIKPEN): PEN_INJECTOR | SUBCUTANEOUS | 1 refills | Status: DC

## 2018-09-01 MED ORDER — METFORMIN HCL ER 500 MG PO TB24: 2000.0000 mg | ORAL_TABLET | Freq: Every day | ORAL | 3 refills | Status: DC

## 2018-09-01 MED ORDER — FREESTYLE LIBRE 14 DAY SENSOR MISC: 1.0000 [IU] | 4 refills | Status: DC

## 2018-09-01 MED ORDER — DAPAGLIFLOZIN PROPANEDIOL 10 MG PO TABS: 10.0000 mg | ORAL_TABLET | Freq: Every day | ORAL | 3 refills | Status: DC

## 2018-09-01 MED ORDER — FREESTYLE LIBRE 14 DAY READER DEVI: 1.0000 | Freq: Once | 0 refills | Status: AC

## 2018-09-01 MED FILL — HUMALOG 100 UNITS/ML KWIKPE: 100 | 62 days supply | Qty: 15 | Fill #0

## 2018-09-01 MED FILL — metFORMIN HCL ER 500 MG TB2: 500 | 30 days supply | Qty: 120 | Fill #0

## 2018-09-01 NOTE — Patient Instructions (Signed)
Check blood sugars on waking up 7 days a week  Also check blood sugars about 2 hours after meals and do this after different meals by rotation  Recommended blood sugar levels on waking up are 90-130 and about 2 hours after meal is 130-180  Please bring your blood sugar monitor to each visit, thank you  Start taking extended release Metformin 500 mg, 1 tablet with your main meal for 5 days.  In some people this may initially cause loose stools or nausea.  If  tolerating well after 5 days add a second Metformin tablet (500 mg) at the same time. Continue adding another tablet after 5 days days if no persistent nausea or diarrhea until reaching the maximum tolerated dose or the full dose of 4 tablets once daily.  FARXIGA to be increased up to 10 mg  LEVEMIR insulin: Go up to 34 units and use a flowsheet to keep increasing it by 2 days until morning sugars are at least under 130  Please call when needing a new prescription and we will switch to [place]  HUMALOG insulin: Take 8 units before supper and after 1 week if the sugars about 2 hours after eating are still over 180 go up by 2 units at a time to get blood sugars in target range  Start regular walking for exercise

## 2018-09-01 NOTE — Progress Notes (Signed)
Patient ID: Sean Schaefer, male   DOB: [birth year], 53 y.o.   MRN: [id number]           Reason for Appointment: Consultation for Type 2 Diabetes  Referring PCP: Merri Ray   History of Present Illness:          Date of diagnosis of type 2 diabetes mellitus: 2010 approximately       Background history:  His A1c range since 2016 has been 8.3-13.3 In the past he has been treated with glipizide Farxiga and more recently insulin since about 2019 but only basal insulin However prior to this he thinks he has been doing much better with his exercise regimen and diet and had A1c is below 6.5 also  Recent history:   Most recent A1c is 11.6 done on 07/08/2018  INSULIN regimen is: 27 units Levemir at night       Non-insulin hypoglycemic drugs the patient is taking are: Farxiga 5 mg daily  Current management, blood sugar patterns and problems identified:  Only recently has started increasing his insulin, previously taking low-dose Levemir  However his blood sugars are still well over 200 throughout the day except they were better when he went for hyperglycemia to the emergency room about 2 weeks ago  He is sometimes having readings well over 300 after meals but not consistently  Does not complain of weakness or excessive thirst usually not drinking regular soft drinks, trying to drink more water  However has not been active with an exercise regimen lately          Side effects from medications have been: None     Meal times are:  Breakfast is at 8 AM lunch: 1130 aM dinner: 6 PM                Exercise: none   Glucose monitoring:  done 2-5 times a day         Glucometer: Freestyle       Blood Glucose readings by time of day and averages from meter download:  PREMEAL Breakfast Lunch Dinner Bedtime  Overall   Glucose range:  147-262   207,309    Median:      243   POST-MEAL PC Breakfast PC Lunch PC Dinner  Glucose range:  245-370   230-416  Median:       Dietician visit,  most recent: Mostly in class setting at diagnosis  Weight history: Highest weight previously 260  Wt Readings from Last 3 Encounters:  09/01/18 216 lb 9.6 oz (98.2 kg)  08/24/18 221 lb (100.2 kg)  08/14/18 216 lb (98 kg)    Glycemic control:   Lab Results  Component Value Date   HGBA1C 11.6 (A) 07/08/2018   HGBA1C 10.4 (H) 02/04/2018   HGBA1C 10.8 (A) 01/03/2018   Lab Results  Component Value Date   LDLCALC 105 (H) 02/04/2018   CREATININE 0.97 08/24/2018   No results found for: MICRALBCREAT  No results found for: FRUCTOSAMINE  No visits with results within 1 Week(s) from this visit.  Latest known visit with results is:  Office Visit on 08/24/2018  Component Date Value Ref Range Status  . POC Glucose 08/24/2018 265* 70 - 99 mg/dl Final  . Glucose 08/24/2018 96  65 - 99 mg/dL Final  . BUN 08/24/2018 14  6 - 24 mg/dL Final  . Creatinine, Ser 08/24/2018 0.97  0.76 - 1.27 mg/dL Final  . GFR calc non Af Amer 08/24/2018 91  >59 mL/min/1.73  Final  . GFR calc Af Amer 08/24/2018 105  >59 mL/min/1.73 Final  . BUN/Creatinine Ratio 08/24/2018 14  9 - 20 Final  . Sodium 08/24/2018 139  134 - 144 mmol/L Final  . Potassium 08/24/2018 3.4* 3.5 - 5.2 mmol/L Final  . Chloride 08/24/2018 99  96 - 106 mmol/L Final  . CO2 08/24/2018 24  20 - 29 mmol/L Final  . Calcium 08/24/2018 9.8  8.7 - 10.2 mg/dL Final  . WBC 08/24/2018 6.1  3.4 - 10.8 x10E3/uL Final  . RBC 08/24/2018 5.17  4.14 - 5.80 x10E6/uL Final  . Hemoglobin 08/24/2018 14.7  13.0 - 17.7 g/dL Final  . Hematocrit 08/24/2018 43.8  37.5 - 51.0 % Final  . MCV 08/24/2018 85  79 - 97 fL Final  . MCH 08/24/2018 28.4  26.6 - 33.0 pg Final  . MCHC 08/24/2018 33.6  31.5 - 35.7 g/dL Final  . RDW 08/24/2018 12.1  11.6 - 15.4 % Final  . Platelets 08/24/2018 244  150 - 450 x10E3/uL Final    Allergies as of 09/01/2018   No Known Allergies     Medication List       Accurate as of September 01, 2018 12:06 PM. Always use your most recent  med list.        blood glucose meter kit and supplies Dispense based on patient and insurance preference. Use up to four times daily as directed. (FOR ICD-10 E10.9, E11.9).   dapagliflozin propanediol 5 MG Tabs tablet Commonly known as:  FARXIGA Take 5 mg by mouth daily.   FREESTYLE LIBRE 14 DAY READER Devi 1 Device by Does not apply route once for 1 dose.   FREESTYLE LIBRE 14 DAY SENSOR Misc 1 Units by Does not apply route every 14 (fourteen) days.   insulin detemir 100 UNIT/ML injection Commonly known as:  LEVEMIR Inject 27 Units into the skin daily. INJECT 27 UNITS UNDER THE SKIN ONCE DAILY AT BEDTIME.   insulin lispro 100 UNIT/ML KwikPen Commonly known as:  HUMALOG KWIKPEN 8 units before each meal or as directed   metFORMIN 500 MG 24 hr tablet Commonly known as:  GLUCOPHAGE-XR Take 4 tablets (2,000 mg total) by mouth daily with supper.   NOVOFINE 32G X 6 MM Misc Generic drug:  Insulin Pen Needle Use as directed.E11.65       Allergies: No Known Allergies  Past Medical History:  Diagnosis Date  . Diabetes mellitus without complication ([place])   . Hypertension   . Ulcerative colitis [facility])     Past Surgical History:  Procedure Laterality Date  . COLON SURGERY    . Perianal fistula repair  2012    Family History  Problem Relation Age of Onset  . Hypertension Mother   . Cancer Mother   . Hypertension Father   . Diabetes Paternal Grandmother   . Diabetes Paternal Uncle   . Diabetes Paternal Grandfather   . Heart disease Neg Hx     Social History:  reports that he has never smoked. He has never used smokeless tobacco. He reports that he does not drink alcohol or use drugs.   Review of Systems  Constitutional: Negative for weight loss.  HENT: Negative for headaches.   Eyes: Negative for blurred vision.  Respiratory: Negative for shortness of breath.   Cardiovascular: Negative for chest pain.  Gastrointestinal: Positive for constipation.  Endocrine:  Negative for fatigue and polydipsia.  Genitourinary: Negative for nocturia.  Musculoskeletal: Negative for joint pain.  Skin: Negative for  rash.  Neurological: Negative for numbness.       In the evenings he may feel a little tightness and tingling in his toes and feet  Psychiatric/Behavioral: Positive for insomnia.       Has chronic insomnia, usually not sleeping more than 2 to 3 hours at night   Lipid history: Not on a statin drug    Lab Results  Component Value Date   CHOL 206 (H) 02/04/2018   HDL 38 (L) 02/04/2018   LDLCALC 105 (H) 02/04/2018   TRIG 315 (H) 02/04/2018   CHOLHDL 5.4 (H) 02/04/2018           Hypertension: Has been on no treatment  BP Readings from Last 3 Encounters:  09/01/18 134/88  08/24/18 (!) 162/82  08/14/18 (!) 162/93    Most recent eye exam was in 9/19  Most recent foot exam:2/20  Currently known complications of diabetes:  LABS:  No visits with results within 1 Week(s) from this visit.  Latest known visit with results is:  Office Visit on 08/24/2018  Component Date Value Ref Range Status  . POC Glucose 08/24/2018 265* 70 - 99 mg/dl Final  . Glucose 08/24/2018 96  65 - 99 mg/dL Final  . BUN 08/24/2018 14  6 - 24 mg/dL Final  . Creatinine, Ser 08/24/2018 0.97  0.76 - 1.27 mg/dL Final  . GFR calc non Af Amer 08/24/2018 91  >59 mL/min/1.73 Final  . GFR calc Af Amer 08/24/2018 105  >59 mL/min/1.73 Final  . BUN/Creatinine Ratio 08/24/2018 14  9 - 20 Final  . Sodium 08/24/2018 139  134 - 144 mmol/L Final  . Potassium 08/24/2018 3.4* 3.5 - 5.2 mmol/L Final  . Chloride 08/24/2018 99  96 - 106 mmol/L Final  . CO2 08/24/2018 24  20 - 29 mmol/L Final  . Calcium 08/24/2018 9.8  8.7 - 10.2 mg/dL Final  . WBC 08/24/2018 6.1  3.4 - 10.8 x10E3/uL Final  . RBC 08/24/2018 5.17  4.14 - 5.80 x10E6/uL Final  . Hemoglobin 08/24/2018 14.7  13.0 - 17.7 g/dL Final  . Hematocrit 08/24/2018 43.8  37.5 - 51.0 % Final  . MCV 08/24/2018 85  79 - 97 fL Final  .  MCH 08/24/2018 28.4  26.6 - 33.0 pg Final  . MCHC 08/24/2018 33.6  31.5 - 35.7 g/dL Final  . RDW 08/24/2018 12.1  11.6 - 15.4 % Final  . Platelets 08/24/2018 244  150 - 450 x10E3/uL Final    Physical Examination:  BP 134/88 (BP Location: Left Arm, Patient Position: Sitting, Cuff Size: Normal)   Pulse (!) 116   Ht 5' 10"  (1.778 m)   Wt 216 lb 9.6 oz (98.2 kg)   SpO2 98%   BMI 31.08 kg/m   Repeat pulse 92  GENERAL:         Patient has mild obesity.    HEENT:         Eye exam shows normal external appearance.  Fundus exam shows no retinopathy.  Oral exam shows normal mucosa .   NECK:   There is no lymphadenopathy  Thyroid is not enlarged and no nodules felt.   Carotids are normal to palpation and no bruit heard  LUNGS:         Chest is symmetrical. Lungs are clear to auscultation.Marland Kitchen   HEART:         Heart sounds:  S1 and S2 are normal. No murmur or click heard., no S3 or S4.   ABDOMEN:  There is no distention present. Liver and spleen are not palpable.  No other mass or tenderness present.    NEUROLOGICAL:   Ankle jerks are absent bilaterally.   Biceps reflexes appear normal  Diabetic Foot Exam - Simple   Simple Foot Form Diabetic Foot exam was performed with the following findings:  Yes 09/01/2018 12:05 PM  Visual Inspection No deformities, no ulcerations, no other skin breakdown bilaterally:  Yes Sensation Testing Intact to touch and monofilament testing bilaterally:  Yes Pulse Check Posterior Tibialis and Dorsalis pulse intact bilaterally:  Yes Comments            Vibration sense is mildly reduced in distal first toes.  MUSCULOSKELETAL:  There is no swelling or deformity of the peripheral joints.     EXTREMITIES:     There is no ankle edema.  SKIN:       No rash or lesions of concern.        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  PLAN:    1. Glucose monitoring: . Patient advised to check readings either fasting, before meals or 2 hours after meals . Also able to use the freestyle libre system if covered by his plan and discussed benefits of using this, he will call if he needs any help with starting this  2.  Diabetes education: . Patient will need consultation with dietitian  3.  Lifestyle changes: . Dietary changes: Reduce portions, high carbohydrate and high fat meals . Exercise regimen: Start regular walking, 30 to 40 minutes at least 5 days a week  4.  Medication changes needed: . Increase Levemir to 34 units . Given him a flowsheet to use to increase the dose every 3 days by 2 units until morning sugars are below 130 . We will also switch him to [facility] once his supply of Levemir is finished and give him about the same dose to start with. . Given patient information brochure and co-pay card on this . FARXIGA will be increased to 10 mg . Trial of METFORMIN for insulin resistance.  Although he has tendency to loose stools chronically will need to see if he is able to tolerate extended-release metformin as this would help with control and improve fasting readings.  He will start with 500 mg and progressively titrate until he is able to take 2000 mg for the maximally tolerated  doses . Today discussed in detail the need for mealtime insulin to cover postprandial spikes, action of mealtime insulin, use of the insulin pen, timing and action of the rapid acting insulin as well as starting dose and dosage titration to target the two-hour reading of under 180 . Start with 8 units of Humalog at suppertime and if blood sugars are consistently over 180 he will  go up by 2 units mealtime . May also consider adding this for breakfast or lunch based on his blood sugar patterns on the next visit  5.  Preventive care needed:  . Urine microalbumin to be done in 5/19  6.  Follow-up: 4 weeks    Patient Instructions  Check blood sugars on waking up 7 days a week  Also check blood sugars about 2 hours after meals and do this after different meals by rotation  Recommended blood sugar levels on waking up are 90-130 and about 2 hours after meal is 130-180  Please bring your blood sugar monitor to each visit, thank you  Start taking extended release Metformin 500 mg, 1 tablet with your main meal for 5 days.  In some people this may initially cause loose stools or nausea.  If  tolerating well after 5 days add a second Metformin tablet (500 mg) at the same time. Continue adding another tablet after 5 days days if no persistent nausea or diarrhea until reaching the maximum tolerated dose or the full dose of 4 tablets once daily.  FARXIGA to be increased up to 10 mg  LEVEMIR insulin: Go up to 34 units and use a flowsheet to keep increasing it by 2 days until morning sugars are at least under 130  Please call when needing a new prescription and we will switch to [place]  HUMALOG insulin: Take 8 units before supper and after 1 week if the sugars about 2 hours after eating are still over 180 go up by 2 units at a time to get blood sugars in target range  Start regular walking for exercise      Total visit time for evaluation and management of multiple problems and counseling =25  minutes  Consultation note has been sent to the referring physician  Elayne Snare 09/01/2018, 12:06 PM   Note: This office note was prepared with Dragon voice recognition system technology. Any transcriptional errors that result from this process are unintentional.

## 2018-09-02 MED FILL — FARXIGA 10 MG TABLET: 10 | 30 days supply | Qty: 30 | Fill #0

## 2018-09-03 MED FILL — NOVOFINE 32G NEEDLES: 32G X 6 MM | 90 days supply | Qty: 100 | Fill #1

## 2018-09-04 ENCOUNTER — Telehealth: Payer: Self-pay

## 2018-09-04 ENCOUNTER — Encounter: Payer: No Typology Code available for payment source | Attending: Endocrinology | Admitting: *Deleted

## 2018-09-04 DIAGNOSIS — E119 Type 2 diabetes mellitus without complications: Secondary | ICD-10-CM | POA: Insufficient documentation

## 2018-09-04 NOTE — Patient Instructions (Signed)
Plan:  Aim for 3 Carb Choices per meal (45 grams) +/- 1 either way  Aim for 0-2 Carbs per snack if hungry  Include protein in moderation with your meals and snacks Consider reading food labels for Total Carbohydrate of foods Consider  increasing your activity level by walking or pool exercises for 30 or more minutes as tolerated Consider checking BG at alternate times per day including after exercise Continue taking medications  as directed by MD

## 2018-09-04 NOTE — Progress Notes (Signed)
Diabetes Self-Management Education  Visit Type:  Follow-up  Appt. Start Time: 0930 Appt. End Time: 1100  09/04/2018  Sean Schaefer, identified by name and date of birth, is a 53 y.o. male with a diagnosis of Diabetes:  .  Patient is here for nutrition education and is interested in broader information about diabetes while he is here. He has had some issues with a tooth abscess, but is feeling better now. He is testing several times a day. Activity level is low but he has plans to increase with pool exercises and walking. ASSESSMENT  There were no vitals taken for this visit. There is no height or weight on file to calculate BMI.   Diabetes Self-Management Education - 09/04/18 0929      Health Coping   How would you rate your overall health?  Good  (Pended)       Psychosocial Assessment   Patient Belief/Attitude about Diabetes  Motivated to manage diabetes  (Pended)     Self-care barriers  None  (Pended)     Learning Readiness  Change in progress  (Pended)       Complications   Last HgB A1C per patient/outside source  --  (Pended)    10   How often do you check your blood sugar?  3-4 times/day  (Pended)     Number of hypoglycemic episodes per month  --  (Pended)    0   Have you had a dilated eye exam in the past 12 months?  Yes  (Pended)     Have you had a dental exam in the past 12 months?  Yes  (Pended)     Are you checking your feet?  Yes  (Pended)       Dietary Intake   Breakfast  2 toast and regular oatmeal OR toast with sausage and egg    Snack (morning)  no    Lunch  [place] food chicken with small rice and vegetables OR [place] and cheese roll up without any bread    Snack (afternoon)  not unless fresh fruit occasionally    Dinner  casserole OR other foods the kids like OR meat & vegetables     Snack (evening)  no    Beverage(s)  water      Subsequent Visit   Since your last visit have you continued or begun to take your medications as prescribed?  Yes  (Pended)     Since your last visit have you experienced any weight changes?  Loss  (Pended)     Since your last visit, are you checking your blood glucose at least once a day?  Yes  (Pended)        Learning Objective:  Patient will have a greater understanding of diabetes self-management. Patient education plan is to attend individual and/or group sessions per assessed needs and concerns.   Plan:   Patient Instructions  Plan:  Aim for 3 Carb Choices per meal (45 grams) +/- 1 either way  Aim for 0-2 Carbs per snack if hungry  Include protein in moderation with your meals and snacks Consider reading food labels for Total Carbohydrate of foods Consider  increasing your activity level by walking or pool exercises for 30 or more minutes as tolerated Consider checking BG at alternate times per day including after exercise Continue taking medications  as directed by MD  Expected Outcomes:     Education material provided: Food label handouts, A1C conversion sheet, Meal plan card and Carbohydrate  counting sheet, Insulin Action handout, Diabetes Medication handout  If problems or questions, patient to contact team via:  Phone  Future DSME appointment: -

## 2018-09-04 NOTE — Telephone Encounter (Signed)
PA initiated via [email or web address] for [employer] 14 days reader and sensor. Key for Reader: [id number] Key for Sensor: [id number]

## 2018-09-10 NOTE — Telephone Encounter (Signed)
Received fax from [place] stating that pt was approved for [facility] Reader and Sensor.  Approved for 2 sensors per 28 days and 1 reader. Approval date good from 09/01/2018-09/01/2019.

## 2018-09-14 ENCOUNTER — Telehealth: Payer: Self-pay | Admitting: Endocrinology

## 2018-09-14 NOTE — Telephone Encounter (Signed)
insulin detemir (LEVEMIR) 100 UNIT/ML injection   Patient stated he was going to be switch to [place] and needs a new prescription sent into his pharmacy      [place], [place]

## 2018-09-14 NOTE — Telephone Encounter (Signed)
Per pt's last office visit note, you stated that pt would be receiving about the same dose of Tresiba as his current dose of Levemir. Do you want exactly the same dose?

## 2018-09-15 MED FILL — FREESTYLE LIBRE 14 DAY READ: 20 days supply | Qty: 1 | Fill #0

## 2018-09-15 MED FILL — FREESTYLE LIBRE 14 DAY SENS: 28 days supply | Qty: 2 | Fill #0

## 2018-09-15 NOTE — Telephone Encounter (Signed)
Could you please advise in Dr. Ronnie Derby absence?

## 2018-09-15 NOTE — Telephone Encounter (Signed)
In last visit's note, Dr. Dwyane Dee advised him to increase the dose of Levemir to 34 units but continue to increase by 2 units until sugars in the morning are at goal.  My suggestion is to check with him to see exactly what dose he got up to and then send the same dose as [place].  In my experience, the doses of these 2 insulins are interconvertible.

## 2018-09-16 ENCOUNTER — Other Ambulatory Visit: Payer: Self-pay

## 2018-09-16 MED ORDER — INSULIN DEGLUDEC 100 UNIT/ML ~~LOC~~ SOLN: 38.0000 [IU] | Freq: Every day | SUBCUTANEOUS | 3 refills | Status: DC

## 2018-09-16 MED FILL — TRESIBA FLEXTOUCH 100 UNITS: 100 | 30 days supply | Qty: 12 | Fill #0

## 2018-09-16 NOTE — Telephone Encounter (Signed)
Called pt and asked him how many units he had gotten to with his Levemir, and he stated that he was up to 38 units and has stopped because blood sugars were within the target range.  Rx for Tresiba 38 units QD was sent to pharmacy.

## 2018-09-25 ENCOUNTER — Other Ambulatory Visit: Payer: Self-pay

## 2018-09-25 ENCOUNTER — Other Ambulatory Visit (INDEPENDENT_AMBULATORY_CARE_PROVIDER_SITE_OTHER): Payer: No Typology Code available for payment source

## 2018-09-25 DIAGNOSIS — Z794 Long term (current) use of insulin: Secondary | ICD-10-CM | POA: Diagnosis not present

## 2018-09-25 DIAGNOSIS — E1165 Type 2 diabetes mellitus with hyperglycemia: Secondary | ICD-10-CM

## 2018-09-25 LAB — BASIC METABOLIC PANEL
BUN: 20 mg/dL (ref 6–23)
CHLORIDE: 99 meq/L (ref 96–112)
CO2: 28 mEq/L (ref 19–32)
CREATININE: 1.04 mg/dL (ref 0.40–1.50)
Calcium: 9.4 mg/dL (ref 8.4–10.5)
GFR: 91.22 mL/min (ref >60.00)
Glucose, Bld: 198 mg/dL — ABNORMAL HIGH (ref 70–99)
Potassium: 4.3 mEq/L (ref 3.5–5.1)
Sodium: 135 mEq/L (ref 135–145)

## 2018-09-26 LAB — FRUCTOSAMINE: Fructosamine: 339 umol/L — ABNORMAL HIGH (ref 0–285)

## 2018-09-30 ENCOUNTER — Encounter: Payer: Self-pay | Admitting: Endocrinology

## 2018-09-30 ENCOUNTER — Other Ambulatory Visit: Payer: Self-pay

## 2018-09-30 ENCOUNTER — Ambulatory Visit (INDEPENDENT_AMBULATORY_CARE_PROVIDER_SITE_OTHER): Payer: No Typology Code available for payment source | Admitting: Endocrinology

## 2018-09-30 VITALS — BP 108/74 | HR 107 | Ht 70.0 in | Wt 217.8 lb

## 2018-09-30 DIAGNOSIS — E782 Mixed hyperlipidemia: Secondary | ICD-10-CM | POA: Diagnosis not present

## 2018-09-30 DIAGNOSIS — E1165 Type 2 diabetes mellitus with hyperglycemia: Secondary | ICD-10-CM | POA: Diagnosis not present

## 2018-09-30 DIAGNOSIS — Z794 Long term (current) use of insulin: Secondary | ICD-10-CM

## 2018-09-30 NOTE — Patient Instructions (Addendum)
Check blood sugars on waking up 5-6 days a week  Also check blood sugars about 2 hours after meals and do this after different meals by rotation  Recommended blood sugar levels on waking up are 90-130 and about 2 hours after meal is 130-160  Please bring your blood sugar monitor to each visit, thank you  3 metformin  daily

## 2018-09-30 NOTE — Progress Notes (Signed)
Patient ID: Sean Schaefer, male   DOB: [birth year], 53 y.o.   MRN: [id number]           Reason for Appointment: Follow-up for Type 2 Diabetes  Referring PCP: Merri Ray   History of Present Illness:          Date of diagnosis of type 2 diabetes mellitus: 2010 approximately       Background history:  His A1c range since 2016 has been 8.3-13.3 In the past he has been treated with glipizide Farxiga and more recently insulin since about 2019 but only basal insulin However prior to this he thinks he has been doing much better with his exercise regimen and diet and had A1c is below 6.5 also  Recent history:   Most recent A1c is 11.6 done on 07/08/2018 Fructosamine is 339  INSULIN regimen is:  TRESIBA 40 units at night, Humalog 10 units AC 3 times daily       Non-insulin hypoglycemic drugs the patient is taking are: Farxiga 10 mg daily, metformin ER 2000 mg daily  Current management, blood sugar patterns and problems identified:  On his initial consultation he was started on mealtime insulin with Humalog, initially 8 units  He has also started monitoring his blood sugar with the freestyle libre sensor and is able to keep up with his blood sugars much better now  On his own he has increased his mealtime insulin by 2 units  With this his POSTPRANDIAL readings are generally better with some variability  He was also given a titration sheet to increase his basal insulin from his previous dosage of 27 units and he has since 09/24/2018 taken 40 units  Also has switched to [place] from [place] on 2/27  FASTING blood sugars have been progressively better and consistently below 130 now in the last 4 to 5 days  He was also started on METFORMIN which he has increased all the way to 4 tablets.  Although he is having some loose stools chronically from his colectomy these are variable from day-to-day but now occasionally having numerous stools during the night  [place] was increased to 10 mg   Overall blood sugars are excellent now with average for the last 2 weeks only 154 compared to 243 previously  But blood sugars coming down and increasing insulin his weight is about the same  He has seen the nutritionist and starting to improve his diet  With this despite increased insulin his weight is about the same, also may be benefiting from increased Farxiga        Side effects from medications have been: None     Meal times are:  Breakfast is at 8 AM lunch: 1130 aM dinner: 6 PM               Exercise:  Starting to do some walking now  Glucose monitoring:  done 2-5 times a day         Glucometer: Freestyle       Blood Glucose readings by time of day and averages from meter download:  Freestyle sensor DATA available for 53% of the time Blood sugars within target 75% of the time and above 180 about 25% of the time without hypoglycemia   PRE-MEAL Fasting Lunch Dinner Bedtime Overall  Glucose range:       Mean/median:  124    154   POST-MEAL PC Breakfast PC Lunch PC Dinner  Glucose range:     Mean/median:  179  181  182   PREVIOUS readings:  PREMEAL Breakfast Lunch Dinner Bedtime  Overall   Glucose range:  147-262   207,309    Median:      243   POST-MEAL PC Breakfast PC Lunch PC Dinner  Glucose range:  245-370   230-416  Median:       Dietician visit, most recent: 08/2018  Weight history: Highest weight previously 260  Wt Readings from Last 3 Encounters:  09/30/18 217 lb 12.8 oz (98.8 kg)  09/01/18 216 lb 9.6 oz (98.2 kg)  08/24/18 221 lb (100.2 kg)    Glycemic control:   Lab Results  Component Value Date   HGBA1C 11.6 (A) 07/08/2018   HGBA1C 10.4 (H) 02/04/2018   HGBA1C 10.8 (A) 01/03/2018   Lab Results  Component Value Date   LDLCALC 105 (H) 02/04/2018   CREATININE 1.04 09/25/2018   No results found for: [facility]  Lab Results  Component Value Date   FRUCTOSAMINE 339 (H) 09/25/2018    Lab on 09/25/2018  Component Date Value Ref Range  Status  . Fructosamine 09/25/2018 339* 0 - 285 umol/L Final   Comment: Published reference interval for apparently healthy subjects between age 42 and 20 is 63 - 285 umol/L and in a poorly controlled diabetic population is 228 - 563 umol/L with a mean of 396 umol/L.   Marland Kitchen Sodium 09/25/2018 135  135 - 145 mEq/L Final  . Potassium 09/25/2018 4.3  3.5 - 5.1 mEq/L Final  . Chloride 09/25/2018 99  96 - 112 mEq/L Final  . CO2 09/25/2018 28  19 - 32 mEq/L Final  . Glucose, Bld 09/25/2018 198* 70 - 99 mg/dL Final  . BUN 09/25/2018 20  6 - 23 mg/dL Final  . Creatinine, Ser 09/25/2018 1.04  0.40 - 1.50 mg/dL Final  . Calcium 09/25/2018 9.4  8.4 - 10.5 mg/dL Final  . GFR 09/25/2018 91.22  >60.00 mL/min Final    Allergies as of 09/30/2018   No Known Allergies     Medication List       Accurate as of September 30, 2018  9:10 PM. Always use your most recent med list.        blood glucose meter kit and supplies Dispense based on patient and insurance preference. Use up to four times daily as directed. (FOR ICD-10 E10.9, E11.9).   dapagliflozin propanediol 10 MG Tabs tablet Commonly known as:  Farxiga Take 10 mg by mouth daily.   FreeStyle Libre 14 Day Sensor Misc 1 Units by Does not apply route every 14 (fourteen) days.   Insulin Degludec 100 UNIT/ML Soln Commonly known as:  Tresiba Inject 38 Units into the skin daily. INJECT 38 UNITS UNDER THE SKIN ONCE DAILY.   insulin lispro 100 UNIT/ML KwikPen Commonly known as:  HumaLOG KwikPen 8 units before each meal or as directed   metFORMIN 500 MG 24 hr tablet Commonly known as:  GLUCOPHAGE-XR Take 4 tablets (2,000 mg total) by mouth daily with supper.   NovoFine 32G X 6 MM Misc Generic drug:  Insulin Pen Needle Use as directed.E11.65       Allergies: No Known Allergies  Past Medical History:  Diagnosis Date  . Diabetes mellitus without complication ([place])   . Hypertension   . Ulcerative colitis [facility])     Past Surgical History:   Procedure Laterality Date  . COLON SURGERY    . Perianal fistula repair  2012    Family History  Problem Relation Age of Onset  .  Hypertension Mother   . Cancer Mother   . Hypertension Father   . Diabetes Paternal Grandmother   . Diabetes Paternal Uncle   . Diabetes Paternal Grandfather   . Heart disease Neg Hx     Social History:  reports that he has never smoked. He has never used smokeless tobacco. He reports that he does not drink alcohol or use drugs.   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 Fructosamine 09/25/2018 339* 0 - 285 umol/L Final   Comment: Published reference interval for apparently healthy subjects between age 61 and 31 is 55 - 285 umol/L and in a poorly controlled diabetic population is 228 - 563 umol/L with a mean of 396 umol/L.   Marland Kitchen Sodium 09/25/2018 135  135 - 145 mEq/L Final  . Potassium 09/25/2018 4.3  3.5 - 5.1 mEq/L Final  . Chloride 09/25/2018 99  96 - 112 mEq/L Final  . CO2 09/25/2018 28  19 - 32 mEq/L Final  . Glucose, Bld 09/25/2018 198* 70 - 99 mg/dL Final  . BUN 09/25/2018 20  6 - 23 mg/dL Final  . Creatinine, Ser 09/25/2018 1.04  0.40 - 1.50 mg/dL Final  . Calcium 09/25/2018 9.4  8.4 - 10.5 mg/dL Final  . GFR 09/25/2018 91.22  >60.00 mL/min Final    Physical Examination:  BP 108/74 (BP Location: Left Arm, Patient Position: Sitting, Cuff Size: Normal)   Pulse (!) 107   Ht 5'  10" (1.778 m)   Wt 217 lb 12.8 oz (98.8 kg)   SpO2 97%   BMI 31.25 kg/m     ASSESSMENT:  Diabetes type 2 on insulin  See history of present illness for detailed discussion of current diabetes management, blood sugar patterns and problems identified  The A1c history indicates persistently poor control with A1c persistently over 10% since 2018  Currently fructosamine is 339  Blood sugars were averaging nearly 250 prior to his initial consultation Now with adding mealtime insulin, metformin and increasing his basal insulin as well as [place] his blood sugars have been progressively improving He still has some postprandial hyperglycemia as discussed above and may not be always covering his meals adequately This is less consistent in the last week However because of not checking his blood sugars enough he does not have complete data on his sensor available  However he appears to be fairly motivated, changing his diet somewhat with consultation from dietitian and recently starting to walk also Fasting readings appear to be optimal with 40 units of Tresiba currently   Increased blood pressure: Appears improved with lifestyle changes and increased Farxiga  Dyslipidemia: Needs follow-up, last LDL over 100  PLAN:    1. Glucose monitoring: . He will try to check blood sugars more consistently at all times including after supper which he is  not doing daily . Discussed blood sugar targets both fasting and after meals . He will be given a flowsheet to keep a record of his pre-and postprandial blood sugars  2.  Diabetes education: . Follow-up with nutritionist as needed  3.  Lifestyle changes: . Continue to increase exercise and work on diet as discussed by dietitian  4.  Medication changes needed: . He can reduce metformin to 3 tablets to reduce tendency to diarrhea . Discussed adjusting mealtime insulin by 2 to 4 units based on total amounts of carbohydrates, meal size and more for  eating out . No change in [place] unless morning sugars start getting relatively low  5.  Follow-up: 8 weeks for repeat A1c and lipids    Patient Instructions  Check blood sugars on waking up 5-6 days a week  Also check blood sugars about 2 hours after meals and do this after different meals by rotation  Recommended blood sugar levels on waking up are 90-130 and about 2 hours after meal is 130-160  Please bring your blood sugar monitor to each visit, thank you  3 metformin  daily  Counseling time on subjects discussed in assessment and plan sections is over 50% of today's 25 minute visit   Consultation note has been sent to the referring physician  Elayne Snare 09/30/2018, 9:10 PM   Note: This office note was prepared with Dragon voice recognition system technology. Any transcriptional errors that result from this process are unintentional.

## 2018-10-01 MED FILL — FARXIGA 10 MG TABLET: 10 | 30 days supply | Qty: 30 | Fill #1

## 2018-10-13 MED FILL — TRESIBA FLEXTOUCH 100 UNITS: 100 | 30 days supply | Qty: 12 | Fill #1

## 2018-10-13 MED FILL — FREESTYLE LIBRE 14 DAY SENS: 28 days supply | Qty: 2 | Fill #1

## 2018-10-13 MED FILL — metFORMIN HCL ER 500 MG TB2: 500 | 30 days supply | Qty: 120 | Fill #1

## 2018-11-03 MED FILL — FARXIGA 10 MG TABLET: 10 | 30 days supply | Qty: 30 | Fill #2

## 2018-11-12 MED FILL — metFORMIN HCL ER 500 MG TB2: 500 | 30 days supply | Qty: 120 | Fill #2

## 2018-11-17 MED FILL — TRESIBA FLEXTOUCH 100 UNITS: 100 | 30 days supply | Qty: 12 | Fill #2

## 2018-11-17 MED FILL — FREESTYLE LIBRE 14 DAY SENS: 28 days supply | Qty: 2 | Fill #2

## 2018-11-26 ENCOUNTER — Other Ambulatory Visit (INDEPENDENT_AMBULATORY_CARE_PROVIDER_SITE_OTHER): Payer: No Typology Code available for payment source

## 2018-11-26 ENCOUNTER — Other Ambulatory Visit: Payer: Self-pay

## 2018-11-26 DIAGNOSIS — E1165 Type 2 diabetes mellitus with hyperglycemia: Secondary | ICD-10-CM | POA: Diagnosis not present

## 2018-11-26 DIAGNOSIS — E782 Mixed hyperlipidemia: Secondary | ICD-10-CM

## 2018-11-26 DIAGNOSIS — Z794 Long term (current) use of insulin: Secondary | ICD-10-CM | POA: Diagnosis not present

## 2018-11-26 LAB — COMPREHENSIVE METABOLIC PANEL
ALT: 18 U/L (ref 0–53)
AST: 19 U/L (ref 0–37)
Albumin: 3.9 g/dL (ref 3.5–5.2)
Alkaline Phosphatase: 46 U/L (ref 39–117)
BUN: 19 mg/dL (ref 6–23)
CO2: 24 mEq/L (ref 19–32)
Calcium: 8.8 mg/dL (ref 8.4–10.5)
Chloride: 101 mEq/L (ref 96–112)
Creatinine, Ser: 1.19 mg/dL (ref 0.40–1.50)
GFR: 78.03 mL/min (ref >60.00)
Glucose, Bld: 143 mg/dL — ABNORMAL HIGH (ref 70–99)
Potassium: 4 mEq/L (ref 3.5–5.1)
Sodium: 134 mEq/L — ABNORMAL LOW (ref 135–145)
Total Bilirubin: 0.6 mg/dL (ref 0.2–1.2)
Total Protein: 8.1 g/dL (ref 6.0–8.3)

## 2018-11-26 LAB — LIPID PANEL
Cholesterol: 186 mg/dL (ref 0–200)
HDL: 43.2 mg/dL (ref >39.00)
NonHDL: 143.1
Total CHOL/HDL Ratio: 4
Triglycerides: 279 mg/dL — ABNORMAL HIGH (ref 0.0–149.0)
VLDL: 55.8 mg/dL — ABNORMAL HIGH (ref 0.0–40.0)

## 2018-11-26 LAB — LDL CHOLESTEROL, DIRECT: Direct LDL: 96 mg/dL

## 2018-11-26 LAB — HEMOGLOBIN A1C: Hgb A1c MFr Bld: 6.6 % — ABNORMAL HIGH (ref 4.6–6.5)

## 2018-11-30 ENCOUNTER — Ambulatory Visit (INDEPENDENT_AMBULATORY_CARE_PROVIDER_SITE_OTHER): Payer: No Typology Code available for payment source | Admitting: Endocrinology

## 2018-11-30 ENCOUNTER — Other Ambulatory Visit: Payer: Self-pay

## 2018-11-30 ENCOUNTER — Encounter: Payer: Self-pay | Admitting: Endocrinology

## 2018-11-30 VITALS — BP 110/80 | HR 87 | Ht 70.0 in | Wt 226.8 lb

## 2018-11-30 DIAGNOSIS — Z794 Long term (current) use of insulin: Secondary | ICD-10-CM | POA: Diagnosis not present

## 2018-11-30 DIAGNOSIS — E782 Mixed hyperlipidemia: Secondary | ICD-10-CM | POA: Diagnosis not present

## 2018-11-30 DIAGNOSIS — E1165 Type 2 diabetes mellitus with hyperglycemia: Secondary | ICD-10-CM | POA: Diagnosis not present

## 2018-11-30 NOTE — Progress Notes (Signed)
Patient ID: Sean Schaefer, male   DOB: Aug 21, 1965, 53 y.o.   MRN: [id number]           Reason for Appointment: Follow-up for Type 2 Diabetes  Referring PCP: Merri Ray   History of Present Illness:          Date of diagnosis of type 2 diabetes mellitus: 2010 approximately       Background history:  His A1c range since 2016 has been 8.3-13.3 In the past he has been treated with glipizide Farxiga and more recently insulin since about 2019 but only basal insulin However prior to this he thinks he has been doing much better with his exercise regimen and diet and had A1c is below 6.5 also  Recent history:   His A1c is now 6.6 compared to 11.6 previously in 12/19  INSULIN regimen is:  TRESIBA 40 units at night, Humalog 10 units AC dinner daily       Non-insulin hypoglycemic drugs the patient is taking are: Farxiga 10 mg daily, metformin ER 2000 mg daily  Current management, blood sugar patterns and problems identified:  On his initial consultation because of significantly high blood sugars and home average blood sugar of 243 he was started on mealtime insulin with Humalog, initially 8 units at suppertime along with metformin ER and also increasing Farxiga to 10 mg  He has also started monitoring his blood sugar with the freestyle libre sensor and switched from [place] to [place] since 09/2018  He is however not checking his blood sugar logs after meals and mostly in the mornings and midday  Although fasting blood sugars are fairly good he tends to have some tendency to high readings after breakfast and lunch  However difficult to get her consistent pattern, currently does not appear to have high readings after dinner usually  He is asking about the blood sugar going up in the morning after breakfast and he thought this was related to his walking  TRESIBA dose has been continued the same dose as [place]mir and his fasting blood sugars are excellent with minimal variability and no  hypoglycemia  On an average blood sugars are better than on the last visit  He was told to reduce his METFORMIN to at least 3 tablets to reduce diarrhea but he continues to take 4 tablets in the evening and has diarrhea during the night frequently  He says he did start walking up to 5 miles but since his sugars were going up after walking in the morning he stopped doing this  Weight has gone up since last visit        Side effects from medications have been: None     Meal times are:  Breakfast is at 8 AM lunch: 1130 aM dinner: 6-7 PM               Exercise:  Starting to do walking   Glucose monitoring:  done 2-3 times a day         Glucometer: Freestyle       Blood Glucose readings by time of day and averages from download:  CGM use % of time  50  2-week average  125  Time in range       90 %  % Time Above 180  10  % Time above 250   % Time Below 70      PRE-MEAL Fasting Lunch Dinner Bedtime Overall  Glucose range:       Averages:  96  159   125   POST-MEAL PC Breakfast PC Lunch PC Dinner  Glucose range:     Averages:  148  180  113    Dietician visit, most recent: 08/2018  Weight history: Highest weight previously 260  Wt Readings from Last 3 Encounters:  11/30/18 226 lb 12.8 oz (102.9 kg)  09/30/18 217 lb 12.8 oz (98.8 kg)  09/01/18 216 lb 9.6 oz (98.2 kg)    Glycemic control:   Lab Results  Component Value Date   HGBA1C 6.6 (H) 11/26/2018   HGBA1C 11.6 (A) 07/08/2018   HGBA1C 10.4 (H) 02/04/2018   Lab Results  Component Value Date   LDLCALC 105 (H) 02/04/2018   CREATININE 1.19 11/26/2018   No results found for: [facility]  Lab Results  Component Value Date   FRUCTOSAMINE 339 (H) 09/25/2018    Lab on 11/26/2018  Component Date Value Ref Range Status  . Cholesterol 11/26/2018 186  0 - 200 mg/dL Final   ATP III Classification       Desirable:  < 200 mg/dL               Borderline High:  200 - 239 mg/dL          High:  > = 240 mg/dL  .  Triglycerides 11/26/2018 279.0* 0.0 - 149.0 mg/dL Final   Normal:  <150 mg/dLBorderline High:  150 - 199 mg/dL  . HDL 11/26/2018 43.20  >39.00 mg/dL Final  . VLDL 11/26/2018 55.8* 0.0 - 40.0 mg/dL Final  . Total CHOL/HDL Ratio 11/26/2018 4   Final                  Men          Women1/2 Average Risk     3.4          3.3Average Risk          5.0          4.42X Average Risk          9.6          7.13X Average Risk          15.0          11.0                      . NonHDL 11/26/2018 143.10   Final   NOTE:  Non-HDL goal should be 30 mg/dL higher than patient's LDL goal (i.e. LDL goal of < 70 mg/dL, would have non-HDL goal of < 100 mg/dL)  . Sodium 11/26/2018 134* 135 - 145 mEq/L Final  . Potassium 11/26/2018 4.0  3.5 - 5.1 mEq/L Final  . Chloride 11/26/2018 101  96 - 112 mEq/L Final  . CO2 11/26/2018 24  19 - 32 mEq/L Final  . Glucose, Bld 11/26/2018 143* 70 - 99 mg/dL Final  . BUN 11/26/2018 19  6 - 23 mg/dL Final  . Creatinine, Ser 11/26/2018 1.19  0.40 - 1.50 mg/dL Final  . Total Bilirubin 11/26/2018 0.6  0.2 - 1.2 mg/dL Final  . Alkaline Phosphatase 11/26/2018 46  39 - 117 U/L Final  . AST 11/26/2018 19  0 - 37 U/L Final  . ALT 11/26/2018 18  0 - 53 U/L Final  . Total Protein 11/26/2018 8.1  6.0 - 8.3 g/dL Final  . Albumin 11/26/2018 3.9  3.5 - 5.2 g/dL Final  . Calcium 11/26/2018 8.8  8.4 - 10.5 mg/dL Final  . GFR 11/26/2018  78.03  >60.00 mL/min Final  . Hgb A1c MFr Bld 11/26/2018 6.6* 4.6 - 6.5 % Final   Glycemic Control Guidelines for People with Diabetes:Non Diabetic:  <6%Goal of Therapy: <7%Additional Action Suggested:  >8%   . Direct LDL 11/26/2018 96.0  mg/dL Final   Optimal:  <100 mg/dLNear or Above Optimal:  100-129 mg/dLBorderline High:  130-159 mg/dLHigh:  160-189 mg/dLVery High:  >190 mg/dL    Allergies as of 11/30/2018   No Known Allergies     Medication List       Accurate as of Nov 30, 2018  4:05 PM. If you have any questions, ask your nurse or doctor.         blood glucose meter kit and supplies Dispense based on patient and insurance preference. Use up to four times daily as directed. (FOR ICD-10 E10.9, E11.9).   dapagliflozin propanediol 10 MG Tabs tablet Commonly known as:  Farxiga Take 10 mg by mouth daily.   FreeStyle Libre 14 Day Sensor Misc 1 Units by Does not apply route every 14 (fourteen) days.   Insulin Degludec 100 UNIT/ML Soln Commonly known as:  Tresiba Inject 38 Units into the skin daily. INJECT 38 UNITS UNDER THE SKIN ONCE DAILY. What changed:  how much to take   insulin lispro 100 UNIT/ML KwikPen Commonly known as:  HumaLOG KwikPen 8 units before each meal or as directed What changed:    how much to take  additional instructions   metFORMIN 500 MG 24 hr tablet Commonly known as:  GLUCOPHAGE-XR Take 4 tablets (2,000 mg total) by mouth daily with supper.   NovoFine 32G X 6 MM Misc Generic drug:  Insulin Pen Needle Use as directed.E11.65       Allergies: No Known Allergies  Past Medical History:  Diagnosis Date  . Diabetes mellitus without complication ([place])   . Hypertension   . Ulcerative colitis [facility])     Past Surgical History:  Procedure Laterality Date  . COLON SURGERY    . Perianal fistula repair  2012    Family History  Problem Relation Age of Onset  . Hypertension Mother   . Cancer Mother   . Hypertension Father   . Diabetes Paternal Grandmother   . Diabetes Paternal Uncle   . Diabetes Paternal Grandfather   . Heart disease Neg Hx     Social History:  reports that he has never smoked. He has never used smokeless tobacco. He reports that he does not drink alcohol or use drugs.   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 Cholesterol 11/26/2018 186  0 - 200 mg/dL Final   ATP III Classification       Desirable:  < 200 mg/dL  Borderline High:  200 - 239 mg/dL          High:  > = 240 mg/dL  . Triglycerides 11/26/2018 279.0* 0.0 - 149.0 mg/dL Final   Normal:  <150 mg/dLBorderline High:  150 - 199 mg/dL  . HDL 11/26/2018 43.20  >39.00 mg/dL Final  . VLDL 11/26/2018 55.8* 0.0 - 40.0 mg/dL Final  . Total CHOL/HDL Ratio 11/26/2018 4   Final                  Men          Women1/2 Average Risk     3.4          3.3Average Risk          5.0          4.42X Average Risk          9.6          7.13X Average Risk          15.0          11.0                      . NonHDL 11/26/2018 143.10   Final   NOTE:  Non-HDL goal should be 30 mg/dL higher than patient's LDL goal (i.e. LDL goal of < 70 mg/dL, would have non-HDL goal of < 100 mg/dL)  . Sodium 11/26/2018 134* 135 - 145 mEq/L Final  . Potassium 11/26/2018 4.0  3.5 - 5.1 mEq/L Final  . Chloride 11/26/2018 101  96 - 112 mEq/L Final  . CO2 11/26/2018 24  19 - 32 mEq/L Final  . Glucose, Bld 11/26/2018 143* 70 - 99 mg/dL Final  . BUN 11/26/2018 19  6 - 23 mg/dL Final  . Creatinine, Ser 11/26/2018 1.19  0.40 - 1.50 mg/dL Final  . Total Bilirubin 11/26/2018 0.6  0.2 - 1.2 mg/dL Final  . Alkaline Phosphatase 11/26/2018 46  39 - 117 U/L Final  . AST 11/26/2018 19  0 - 37 U/L Final  . ALT 11/26/2018 18  0 - 53 U/L Final  . Total Protein 11/26/2018 8.1  6.0 - 8.3 g/dL Final  . Albumin 11/26/2018 3.9  3.5 - 5.2 g/dL Final  . Calcium 11/26/2018  8.8  8.4 - 10.5 mg/dL Final  . GFR 11/26/2018 78.03  >60.00 mL/min Final  . Hgb A1c MFr Bld 11/26/2018 6.6* 4.6 - 6.5 % Final   Glycemic Control Guidelines for People with Diabetes:Non Diabetic:  <6%Goal of Therapy: <7%Additional Action Suggested:  >8%   . Direct LDL 11/26/2018 96.0  mg/dL Final   Optimal:  <100 mg/dLNear or Above Optimal:  100-129 mg/dLBorderline High:  130-159 mg/dLHigh:  160-189 mg/dLVery High:  >190 mg/dL    Physical Examination:  BP 110/80 (BP Location: Left Arm, Patient Position: Sitting, Cuff Size: Normal)   Pulse 87   Ht _0  (1.778 m)   Wt 226 lb 12.8 oz (102.9 kg)   SpO2 99%   BMI 32.54 kg/m     ASSESSMENT:  Diabetes type 2 on insulin  See history of present illness for detailed discussion of current diabetes management, blood sugar patterns and problems identified  The A1c history indicated persistently poor control with A1c previously  Now A1c is excellent at 6.6  His continued glucose monitoring results were reviewed and discussed  He has taken mealtime insulin only at dinnertime and with this his blood sugars are usually controlled but has very limited  data available for evening blood sugars Does not understand the need for monitoring blood sugars regularly after meals to help adjust his mealtime insulin Also appears to be having higher blood sugars after breakfast and lunch most of the time with some variability indicating need for insulin at all meals Although he is doing fairly well with adding metformin he is getting significant GI side effects from this Currently not exercising  Explained to the patient that his blood sugars go up in the morning with breakfast because of the carbohydrate intake rather than from exercise that he thought. He has gained weight  Blood pressure well controlled  Dyslipidemia: LDL has gone down below 100 However needs fasting lipids since triglycerides appear to be high and may need fenofibrate  PLAN:     1. Glucose monitoring: . He was again explained the importance of monitoring blood sugars consistently at least 3 times a day since the sensor will not retain the data for more than 8 hours Also discussed the importance of monitoring postprandial readings to help adjust his insulin   2.  Diabetes education: . Follow-up with nutritionist as needed  3.  Lifestyle changes: . He will restart exercise with walking and reminded him that this will not increase his blood sugar  4.  Medication changes needed: . He can reduce metformin to 2 tablets possibly in split doses to reduce nocturnal diarrhea, may go up to 3 tablets later if needed . Start taking 5 to 7 units NovoLog before breakfast and lunch and adjust based on amount of carbohydrate . He will need to keep his postprandial readings ideally around 150-160 . Continue 40 units Tresiba Also adjust suppertime dose based on postprandial blood sugar patterns which are not being monitored currently   5.  Follow-up: 12 weeks for repeat A1c and fasting lipids  Counseling time on subjects discussed in assessment and plan sections is over 50% of today's 25 minute visit   Patient Instructions  5-7 Units Humalog at Bfst and 10 at supper  Take 1 Metformin 2x daily  Check blood sugars on waking up days a week  Also check blood sugars about 2 hours after meals and do this after different meals by rotation  Recommended blood sugar levels on waking up are 90-130 and about 2 hours after meal is 130-160  Please bring your blood sugar monitor to each visit, thank you    Elayne Snare 11/30/2018, 4:05 PM   Note: This office note was prepared with Dragon voice recognition system technology. Any transcriptional errors that result from this process are unintentional.

## 2018-11-30 NOTE — Patient Instructions (Addendum)
5-7 Units Humalog at Bfst and 10 at supper  Take 1 Metformin 2x daily  Check blood sugars on waking up days a week  Also check blood sugars about 2 hours after meals and do this after different meals by rotation  Recommended blood sugar levels on waking up are 90-130 and about 2 hours after meal is 130-160  Please bring your blood sugar monitor to each visit, thank you

## 2018-12-03 MED FILL — FARXIGA 10 MG TABLET: 10 | 30 days supply | Qty: 30 | Fill #3

## 2018-12-16 MED FILL — FREESTYLE LIBRE 14 DAY SENS: 28 days supply | Qty: 2 | Fill #3

## 2018-12-16 MED FILL — TRESIBA FLEXTOUCH 100 UNITS: 100 | 30 days supply | Qty: 12 | Fill #3

## 2018-12-29 ENCOUNTER — Other Ambulatory Visit: Payer: Self-pay | Admitting: Endocrinology

## 2018-12-29 MED FILL — FARXIGA 10 MG TABLET: 10 | 30 days supply | Qty: 30 | Fill #0

## 2018-12-29 MED FILL — metFORMIN HCL ER 500 MG TB2: 500 | 30 days supply | Qty: 120 | Fill #3

## 2019-01-11 MED FILL — FREESTYLE LIBRE 14 DAY SENS: 28 days supply | Qty: 2 | Fill #4

## 2019-01-14 MED FILL — HUMALOG 100 UNITS/ML KWIKPE: 100 | 62 days supply | Qty: 15 | Fill #1

## 2019-01-19 MED FILL — TRESIBA FLEXTOUCH 100 UNITS: 100 | 30 days supply | Qty: 12 | Fill #4

## 2019-02-01 MED FILL — FARXIGA 10 MG TABLET: 10 | 30 days supply | Qty: 30 | Fill #1

## 2019-02-08 ENCOUNTER — Other Ambulatory Visit: Payer: Self-pay

## 2019-02-08 ENCOUNTER — Telehealth: Payer: Self-pay | Admitting: Endocrinology

## 2019-02-08 MED ORDER — FREESTYLE LIBRE 14 DAY SENSOR MISC: 1.0000 [IU] | 4 refills | Status: DC

## 2019-02-08 MED FILL — FREESTYLE LIBRE 14 DAY SENS: 28 days supply | Qty: 2 | Fill #0

## 2019-02-08 NOTE — Telephone Encounter (Signed)
MEDICATION: Freestyle Libre 14 Day Sensor  PHARMACY:  [facility]  IS THIS A 90 DAY SUPPLY : unknown  IS PATIENT OUT OF MEDICATION: yes  IF NOT; HOW MUCH IS LEFT:   LAST APPOINTMENT DATE: @6 /03/2019  NEXT APPOINTMENT DATE:@8 /04/2019  DO WE HAVE YOUR PERMISSION TO LEAVE A DETAILED MESSAGE: YES [phone]  OTHER COMMENTS:    **Let patient know to contact pharmacy at the end of the day to make sure medication is ready. **  ** Please notify patient to allow 48-72 hours to process**  **Encourage patient to contact the pharmacy for refills or they can request refills through [facility]**

## 2019-02-08 NOTE — Telephone Encounter (Signed)
Rx sent 

## 2019-02-22 ENCOUNTER — Other Ambulatory Visit: Payer: Self-pay | Admitting: Physician Assistant

## 2019-02-22 MED FILL — TRESIBA FLEXTOUCH 100 UNITS: 100 | 30 days supply | Qty: 12 | Fill #5

## 2019-03-01 ENCOUNTER — Other Ambulatory Visit (INDEPENDENT_AMBULATORY_CARE_PROVIDER_SITE_OTHER): Payer: No Typology Code available for payment source

## 2019-03-01 ENCOUNTER — Other Ambulatory Visit: Payer: Self-pay

## 2019-03-01 DIAGNOSIS — E782 Mixed hyperlipidemia: Secondary | ICD-10-CM

## 2019-03-01 DIAGNOSIS — Z794 Long term (current) use of insulin: Secondary | ICD-10-CM | POA: Diagnosis not present

## 2019-03-01 DIAGNOSIS — E1165 Type 2 diabetes mellitus with hyperglycemia: Secondary | ICD-10-CM | POA: Diagnosis not present

## 2019-03-01 LAB — COMPREHENSIVE METABOLIC PANEL
ALT: 15 U/L (ref 0–53)
AST: 19 U/L (ref 0–37)
Albumin: 3.9 g/dL (ref 3.5–5.2)
Alkaline Phosphatase: 44 U/L (ref 39–117)
BUN: 17 mg/dL (ref 6–23)
CO2: 23 mEq/L (ref 19–32)
Calcium: 9.3 mg/dL (ref 8.4–10.5)
Chloride: 106 mEq/L (ref 96–112)
Creatinine, Ser: 0.98 mg/dL (ref 0.40–1.50)
GFR: 97.53 mL/min (ref >60.00)
Glucose, Bld: 132 mg/dL — ABNORMAL HIGH (ref 70–99)
Potassium: 4.2 mEq/L (ref 3.5–5.1)
Sodium: 137 mEq/L (ref 135–145)
Total Bilirubin: 0.6 mg/dL (ref 0.2–1.2)
Total Protein: 8 g/dL (ref 6.0–8.3)

## 2019-03-01 LAB — LIPID PANEL
Cholesterol: 204 mg/dL — ABNORMAL HIGH (ref 0–200)
HDL: 40.9 mg/dL (ref >39.00)
NonHDL: 163.29
Total CHOL/HDL Ratio: 5
Triglycerides: 232 mg/dL — ABNORMAL HIGH (ref 0.0–149.0)
VLDL: 46.4 mg/dL — ABNORMAL HIGH (ref 0.0–40.0)

## 2019-03-01 LAB — HEMOGLOBIN A1C: Hgb A1c MFr Bld: 6 % (ref 4.6–6.5)

## 2019-03-01 LAB — LDL CHOLESTEROL, DIRECT: Direct LDL: 106 mg/dL

## 2019-03-02 ENCOUNTER — Other Ambulatory Visit: Payer: Self-pay | Admitting: Endocrinology

## 2019-03-02 MED FILL — metFORMIN HCL ER 500 MG TB2: 500 | 30 days supply | Qty: 120 | Fill #0

## 2019-03-02 MED FILL — FARXIGA 10 MG TABLET: 10 | 30 days supply | Qty: 30 | Fill #2

## 2019-03-04 ENCOUNTER — Ambulatory Visit: Payer: No Typology Code available for payment source | Admitting: Endocrinology

## 2019-03-04 NOTE — Progress Notes (Signed)
Patient ID: Sean Schaefer, male   DOB: July 20, 1966, 53 y.o.   MRN: [id number]           Reason for Appointment: Follow-up for Type 2 Diabetes  Referring PCP: Merri Ray   History of Present Illness:          Date of diagnosis of type 2 diabetes mellitus: 2010 approximately       Background history:  His A1c range since 2016 has been 8.3-13.3 In the past he has been treated with glipizide Farxiga and more recently insulin since about 2019 but only basal insulin However prior to this he thinks he has been doing much better with his exercise regimen and diet and had A1c is below 6.5 also Had a high A1c of 11.6 previously in 12/19  Recent history:   His A1c is now 6.0 compared to 6.6  INSULIN regimen is:  TRESIBA 40 units at night, Humalog 10 units AC dinner daily       Non-insulin hypoglycemic drugs the patient is taking are: Farxiga 10 mg daily at dinnertime, metformin ER 500 bid mg daily  Current management, blood sugar patterns and problems identified:  Although he has been continued on his Humalog insulin at suppertime and generally taking 10 units his blood sugars are appearing to be recently lower after dinner and may be low normal at times  This is likely to be from his continued overall improvement and better diet with generally lower carbohydrate intake  He has gained weight but he thinks this is from not watching his diet for several days last month  HYPERGLYCEMIA as discussed below may be after breakfast and occasionally only after dinner  Last night blood sugar went up because of eating some rice and also ice cream  Occasionally in the morning after eating biscuits with meat he will have a high reading  However he has started walking more and on some weekends may walk up to 7 miles  He is taking Farxiga at dinnertime instead of in the morning        Side effects from medications have been: None     Meal times are:  Breakfast is at 8 AM lunch: 1130 aM dinner:  6-7 PM                Glucose monitoring:  done 2-3 times a day         Glucometer: Freestyle      CONTINUOUS GLUCOSE MONITORING RECORD INTERPRETATION    Dates of Recording: 8/1 through 8/14  Sensor description: [employer]  Results statistics:   CGM use % of time  93  Average and SD  133, GV 23  Time in range      94 %  % Time Above 180  6  % Time above 250 0  % Time Below target 0    Glycemic patterns summary: His freestyle libre is at least 10 mg lower than the actual blood sugars when compared to the lab glucose Blood sugars are showing minimal variability throughout the day with tendency to mild postprandial hyperglycemia after breakfast and lunch but relatively low readings after dinner He will also occasionally check blood sugars with the test for plan    Hyperglycemic episodes are occurring after breakfast and rarely after dinner  Hypoglycemic episodes occurred only once after dinner when blood sugar was 58  Overnight periods: Blood sugars are very stable through the night and averaging in the 120s mostly  Preprandial periods: Blood  sugars are near normal before breakfast, overall mildly high at 140 before lunch and more variable at dinnertime Blood sugars before dinnertime are averaging about 140-150  Postprandial periods:   After breakfast:   Glucose may have occasionally rise over 180 or 200 but this is variable  After lunch:   Blood sugars are generally unchanged or minimally higher on average  After dinner: Glucose generally come down after the meal was low as 58 but occasionally may be higher since his last night really grown slowly up to 230   PRE-MEAL Fasting Lunch Dinner Bedtime Overall  Glucose range:       Mean/median:  119  140  156  129  133   POST-MEAL PC Breakfast PC Lunch PC Dinner  Glucose range:     Mean/median:  154  145  108   PREVIOUS average 125     Dietician visit, most recent: 08/2018  Weight history: Highest weight  previously 260  Wt Readings from Last 3 Encounters:  03/05/19 237 lb (107.5 kg)  11/30/18 226 lb 12.8 oz (102.9 kg)  09/30/18 217 lb 12.8 oz (98.8 kg)    Glycemic control:   Lab Results  Component Value Date   HGBA1C 6.0 03/01/2019   HGBA1C 6.6 (H) 11/26/2018   HGBA1C 11.6 (A) 07/08/2018   Lab Results  Component Value Date   LDLCALC 105 (H) 02/04/2018   CREATININE 0.98 03/01/2019   No results found for: [facility]  Lab Results  Component Value Date   FRUCTOSAMINE 339 (H) 09/25/2018    Lab on 03/01/2019  Component Date Value Ref Range Status  . Cholesterol 03/01/2019 204* 0 - 200 mg/dL Final   ATP III Classification       Desirable:  < 200 mg/dL               Borderline High:  200 - 239 mg/dL          High:  > = 240 mg/dL  . Triglycerides 03/01/2019 232.0* 0.0 - 149.0 mg/dL Final   Normal:  <150 mg/dLBorderline High:  150 - 199 mg/dL  . HDL 03/01/2019 40.90  >39.00 mg/dL Final  . VLDL 03/01/2019 46.4* 0.0 - 40.0 mg/dL Final  . Total CHOL/HDL Ratio 03/01/2019 5   Final                  Men          Women1/2 Average Risk     3.4          3.3Average Risk          5.0          4.42X Average Risk          9.6          7.13X Average Risk          15.0          11.0                      . NonHDL 03/01/2019 163.29   Final   NOTE:  Non-HDL goal should be 30 mg/dL higher than patient's LDL goal (i.e. LDL goal of < 70 mg/dL, would have non-HDL goal of < 100 mg/dL)  . Sodium 03/01/2019 137  135 - 145 mEq/L Final  . Potassium 03/01/2019 4.2  3.5 - 5.1 mEq/L Final  . Chloride 03/01/2019 106  96 - 112 mEq/L Final  . CO2 03/01/2019 23  19 - 32 mEq/L  Final  . Glucose, Bld 03/01/2019 132* 70 - 99 mg/dL Final  . BUN 03/01/2019 17  6 - 23 mg/dL Final  . Creatinine, Ser 03/01/2019 0.98  0.40 - 1.50 mg/dL Final  . Total Bilirubin 03/01/2019 0.6  0.2 - 1.2 mg/dL Final  . Alkaline Phosphatase 03/01/2019 44  39 - 117 U/L Final  . AST 03/01/2019 19  0 - 37 U/L Final  . ALT 03/01/2019 15  0 -  53 U/L Final  . Total Protein 03/01/2019 8.0  6.0 - 8.3 g/dL Final  . Albumin 03/01/2019 3.9  3.5 - 5.2 g/dL Final  . Calcium 03/01/2019 9.3  8.4 - 10.5 mg/dL Final  . GFR 03/01/2019 97.53  >60.00 mL/min Final  . Hgb A1c MFr Bld 03/01/2019 6.0  4.6 - 6.5 % Final   Glycemic Control Guidelines for People with Diabetes:Non Diabetic:  <6%Goal of Therapy: <7%Additional Action Suggested:  >8%   . Direct LDL 03/01/2019 106.0  mg/dL Final   Optimal:  <100 mg/dLNear or Above Optimal:  100-129 mg/dLBorderline High:  130-159 mg/dLHigh:  160-189 mg/dLVery High:  >190 mg/dL    Allergies as of 03/05/2019   No Known Allergies     Medication List       Accurate as of March 05, 2019  8:06 AM. If you have any questions, ask your nurse or doctor.        blood glucose meter kit and supplies Dispense based on patient and insurance preference. Use up to four times daily as directed. (FOR ICD-10 E10.9, E11.9).   Farxiga 10 MG Tabs tablet Generic drug: dapagliflozin propanediol TAKE 1 TABLET BY MOUTH ONCE A DAY   FreeStyle Libre 14 Day Sensor Misc 1 Units by Does not apply route every 14 (fourteen) days.   HumaLOG KwikPen 100 UNIT/ML KwikPen Generic drug: insulin lispro Inject into the skin 2 (two) times daily. Inject 5 units under the skin at breakfast, and 10 units at dinner. What changed: Another medication with the same name was removed. Continue taking this medication, and follow the directions you see here. Changed by: Elayne Snare, MD   metFORMIN 500 MG tablet Commonly known as: GLUCOPHAGE Take 500 mg by mouth 2 (two) times daily. Take 1 tablet by mouth in the morning and 1 tablet in the evening. What changed: Another medication with the same name was removed. Continue taking this medication, and follow the directions you see here. Changed by: Elayne Snare, MD   NovoFine 32G X 6 MM Misc Generic drug: Insulin Pen Needle Use as directed.E11.65   Tresiba FlexTouch 100 UNIT/ML Sopn FlexTouch  Pen Generic drug: insulin degludec Inject 40 Units into the skin daily. Inject 40 units under the skin once daily. What changed: Another medication with the same name was removed. Continue taking this medication, and follow the directions you see here. Changed by: Elayne Snare, MD       Allergies: No Known Allergies  Past Medical History:  Diagnosis Date  . Diabetes mellitus without complication ([place])   . Hypertension   . Ulcerative colitis [facility])     Past Surgical History:  Procedure Laterality Date  . COLON SURGERY    . Perianal fistula repair  2012    Family History  Problem Relation Age of Onset  . Hypertension Mother   . Cancer Mother   . Hypertension Father   . Diabetes Paternal Grandmother   . Diabetes Paternal Uncle   . Diabetes Paternal Grandfather   . Heart disease Neg Hx  Social History:  reports that he has never smoked. He has never used smokeless tobacco. He reports that he does not drink alcohol or use drugs.   Review of Systems Lipid history: Has persistently high triglycerides although slightly better Also LDL is just over 100. Not on a statin drug   Lab Results  Component Value Date   CHOL 204 (H) 03/01/2019   HDL 40.90 03/01/2019   LDLCALC 105 (H) 02/04/2018   LDLDIRECT 106.0 03/01/2019   TRIG 232.0 (H) 03/01/2019   CHOLHDL 5 03/01/2019           Has fairly normal blood pressure recently, only on Farxiga currently; previously did have increased blood pressure  BP Readings from Last 3 Encounters:  03/05/19 122/84  11/30/18 110/80  09/30/18 108/74    Most recent eye exam was in 9/19  Most recent foot exam:2/20  Currently known complications of diabetes: Mild symptoms of neuropathy  LABS:  Lab on 03/01/2019  Component Date Value Ref Range Status  . Cholesterol 03/01/2019 204* 0 - 200 mg/dL Final   ATP III Classification       Desirable:  < 200 mg/dL               Borderline High:  200 - 239 mg/dL          High:  > = 240 mg/dL   . Triglycerides 03/01/2019 232.0* 0.0 - 149.0 mg/dL Final   Normal:  <150 mg/dLBorderline High:  150 - 199 mg/dL  . HDL 03/01/2019 40.90  >39.00 mg/dL Final  . VLDL 03/01/2019 46.4* 0.0 - 40.0 mg/dL Final  . Total CHOL/HDL Ratio 03/01/2019 5   Final                  Men          Women1/2 Average Risk     3.4          3.3Average Risk          5.0          4.42X Average Risk          9.6          7.13X Average Risk          15.0          11.0                      . NonHDL 03/01/2019 163.29   Final   NOTE:  Non-HDL goal should be 30 mg/dL higher than patient's LDL goal (i.e. LDL goal of < 70 mg/dL, would have non-HDL goal of < 100 mg/dL)  . Sodium 03/01/2019 137  135 - 145 mEq/L Final  . Potassium 03/01/2019 4.2  3.5 - 5.1 mEq/L Final  . Chloride 03/01/2019 106  96 - 112 mEq/L Final  . CO2 03/01/2019 23  19 - 32 mEq/L Final  . Glucose, Bld 03/01/2019 132* 70 - 99 mg/dL Final  . BUN 03/01/2019 17  6 - 23 mg/dL Final  . Creatinine, Ser 03/01/2019 0.98  0.40 - 1.50 mg/dL Final  . Total Bilirubin 03/01/2019 0.6  0.2 - 1.2 mg/dL Final  . Alkaline Phosphatase 03/01/2019 44  39 - 117 U/L Final  . AST 03/01/2019 19  0 - 37 U/L Final  . ALT 03/01/2019 15  0 - 53 U/L Final  . Total Protein 03/01/2019 8.0  6.0 - 8.3 g/dL Final  . Albumin 03/01/2019 3.9  3.5 - 5.2 g/dL  Final  . Calcium 03/01/2019 9.3  8.4 - 10.5 mg/dL Final  . GFR 03/01/2019 97.53  >60.00 mL/min Final  . Hgb A1c MFr Bld 03/01/2019 6.0  4.6 - 6.5 % Final   Glycemic Control Guidelines for People with Diabetes:Non Diabetic:  <6%Goal of Therapy: <7%Additional Action Suggested:  >8%   . Direct LDL 03/01/2019 106.0  mg/dL Final   Optimal:  <100 mg/dLNear or Above Optimal:  100-129 mg/dLBorderline High:  130-159 mg/dLHigh:  160-189 mg/dLVery High:  >190 mg/dL    Physical Examination:  BP 122/84 (BP Location: Left Arm, Patient Position: Sitting, Cuff Size: Normal)   Pulse (!) 103   Ht 5' 10"  (1.778 m)   Wt 237 lb (107.5 kg)   SpO2 98%    BMI 34.01 kg/m     ASSESSMENT:  Diabetes type 2 on insulin  See history of present illness for detailed discussion of current diabetes management, blood sugar patterns and problems identified  The A1c history indicated persistently poor control with A1c previously  Now A1c is excellent at 6.0  Blood sugars are generally stable but somewhat variable after meals including breakfast depending on his diet Discussed his blood sugar profiles after various meals as outlined in the CGM analysis of his freestyle libre as above  Now appears that he may not need Humalog insulin consistently at dinnertime if he eats a low carbohydrate meal However with higher carbohydrate or high fat meals at breakfast he may need factor VIII units of insulin and this was discussed Since he is checking sugars regularly after meals also he can try to adjust his insulin accordingly Also checking blood sugars more often now He has gained weight again and may be partly related to regaining what he had lost last year with his hyperglycemia Recently however diet has been suboptimal at times  Blood pressure fairly well controlled  Dyslipidemia: LDL has gone back up and his triglycerides are still over 200 Discussed that he is at high risk because of his diabetes and dyslipidemia He needs to continue improving diet Also discussed statin treatment as risk reduction measures    PLAN:    Lipitor 10 mg daily  . Discussed continuing excellent diet and cut back on high fat foods . Increase exercise to prevent any further weight gain  .  Medication changes needed: . He can continue metformin twice a day . However he needs to move his [place] to before breakfast for better control of postprandial readings during the daytime . He can take his mealtime insulin according to what he is eating as discussed above and may not always needed at dinnertime, likely needs 5 to 10 units at various meals including if eating more  carbohydrates and high-fat meal at breakfast Tyler Aas will be continued unchanged  Needs urine microalbumin  Continue following blood pressure  A1c in 3 months  There are no Patient Instructions on file for this visit.  Counseling time on subjects discussed in assessment and plan sections is over 50% of today's 25 minute visit  Elayne Snare 03/05/2019, 8:06 AM   Note: This office note was prepared with Dragon voice recognition system technology. Any transcriptional errors that result from this process are unintentional.

## 2019-03-05 ENCOUNTER — Other Ambulatory Visit: Payer: Self-pay

## 2019-03-05 ENCOUNTER — Encounter: Payer: Self-pay | Admitting: Endocrinology

## 2019-03-05 ENCOUNTER — Ambulatory Visit (INDEPENDENT_AMBULATORY_CARE_PROVIDER_SITE_OTHER): Payer: No Typology Code available for payment source | Admitting: Endocrinology

## 2019-03-05 VITALS — BP 122/84 | HR 103 | Ht 70.0 in | Wt 237.0 lb

## 2019-03-05 DIAGNOSIS — Z794 Long term (current) use of insulin: Secondary | ICD-10-CM | POA: Diagnosis not present

## 2019-03-05 DIAGNOSIS — R635 Abnormal weight gain: Secondary | ICD-10-CM

## 2019-03-05 DIAGNOSIS — E1165 Type 2 diabetes mellitus with hyperglycemia: Secondary | ICD-10-CM | POA: Diagnosis not present

## 2019-03-05 DIAGNOSIS — E782 Mixed hyperlipidemia: Secondary | ICD-10-CM

## 2019-03-05 LAB — MICROALBUMIN / CREATININE URINE RATIO
Creatinine,U: 80.7 mg/dL
Microalb Creat Ratio: 1.5 mg/g (ref 0.0–30.0)
Microalb, Ur: 1.2 mg/dL (ref 0.0–1.9)

## 2019-03-05 MED ORDER — FREESTYLE PRECISION NEO TEST VI STRP: 1.0000 | ORAL_STRIP | Freq: Every day | 3 refills | Status: DC

## 2019-03-05 MED ORDER — ATORVASTATIN CALCIUM 10 MG PO TABS: 10.0000 mg | ORAL_TABLET | Freq: Every day | ORAL | 3 refills | Status: DC

## 2019-03-05 MED FILL — ATORVASTATIN 10 MG TABLET: 10 | 90 days supply | Qty: 90 | Fill #0

## 2019-03-05 NOTE — Patient Instructions (Signed)
Humalog 5-10 at meals with carbs

## 2019-03-22 MED FILL — FREESTYLE LITE TEST STRIP: 90 days supply | Qty: 100 | Fill #0

## 2019-03-25 ENCOUNTER — Other Ambulatory Visit: Payer: Self-pay | Admitting: Endocrinology

## 2019-03-25 MED FILL — ATORVASTATIN 10 MG TABLET: 10 | 90 days supply | Qty: 90 | Fill #0

## 2019-03-25 MED FILL — TADALAFIL 10 MG TABS: 10 | 90 days supply | Qty: 18 | Fill #0

## 2019-03-25 MED FILL — TRESIBA FLEXTOUCH 100 UNITS: 100 | 30 days supply | Qty: 12 | Fill #6

## 2019-03-25 MED FILL — HUMALOG 100 UNITS/ML KWIKPE: 100 | 87 days supply | Qty: 21 | Fill #0

## 2019-03-26 MED FILL — metFORMIN HCL ER 500 MG TB2: 500 | 30 days supply | Qty: 120 | Fill #1

## 2019-03-26 MED FILL — FARXIGA 10 MG TABLET: 10 | 30 days supply | Qty: 30 | Fill #3

## 2019-04-06 MED FILL — FREESTYLE LIBRE 14 DAY SENS: 28 days supply | Qty: 2 | Fill #1

## 2019-04-23 ENCOUNTER — Other Ambulatory Visit: Payer: Self-pay | Admitting: Physician Assistant

## 2019-04-23 NOTE — Telephone Encounter (Signed)
Requested medication (s) are due for refill today: yes  Requested medication (s) are on the active medication list: yes  Last refill:  09/03/2018  Future visit scheduled: no  Notes to clinic: review for refill   Requested Prescriptions  Pending Prescriptions Disp Refills   NOVOFINE 32G X 6 MM [place] [Pharmacy Med Name: NOVOFINE 32G NEEDLES 32G X 6 MM Miscellaneous] 100 each PRN    Sig: USE AS DIRECTED     Endocrinology: Diabetes - Testing Supplies Passed - 04/23/2019  8:25 AM      Passed - Valid encounter within last 12 months    Recent Outpatient Visits          8 months ago Uncontrolled type 2 diabetes mellitus with hyperglycemia ([place])   [facility], Ranell Patrick, MD   9 months ago Uncontrolled type 2 diabetes mellitus with hyperglycemia [facility])   [facility], Ranell Patrick, MD   1 year ago Type 2 diabetes mellitus with hyperglycemia, with long-term current use of insulin [facility])   [facility], Ranell Patrick, MD   1 year ago Type 2 diabetes mellitus without complication, with long-term current use of insulin [facility])   [facility] [place], PA-C   1 year ago Uncontrolled type 2 diabetes mellitus with hyperglycemia [facility])   [facility], Audrie Lia, PA-C

## 2019-04-24 MED FILL — NOVOFINE 32G NEEDLES: 32G X 6 MM | 90 days supply | Qty: 100 | Fill #0

## 2019-05-05 ENCOUNTER — Other Ambulatory Visit: Payer: Self-pay | Admitting: Endocrinology

## 2019-05-05 MED FILL — TRESIBA FLEXTOUCH 100 UNITS: 100 | 30 days supply | Qty: 12 | Fill #7

## 2019-05-05 MED FILL — FREESTYLE LIBRE 14 DAY SENS: 28 days supply | Qty: 2 | Fill #2

## 2019-05-05 MED FILL — FARXIGA 10 MG TABLET: 10 | 30 days supply | Qty: 30 | Fill #0

## 2019-05-31 ENCOUNTER — Other Ambulatory Visit: Payer: No Typology Code available for payment source

## 2019-06-01 ENCOUNTER — Other Ambulatory Visit: Payer: Self-pay

## 2019-06-01 ENCOUNTER — Other Ambulatory Visit (INDEPENDENT_AMBULATORY_CARE_PROVIDER_SITE_OTHER): Payer: No Typology Code available for payment source

## 2019-06-01 DIAGNOSIS — Z794 Long term (current) use of insulin: Secondary | ICD-10-CM

## 2019-06-01 DIAGNOSIS — E1165 Type 2 diabetes mellitus with hyperglycemia: Secondary | ICD-10-CM

## 2019-06-01 DIAGNOSIS — E782 Mixed hyperlipidemia: Secondary | ICD-10-CM | POA: Diagnosis not present

## 2019-06-01 LAB — COMPREHENSIVE METABOLIC PANEL
ALT: 19 U/L (ref 0–53)
AST: 17 U/L (ref 0–37)
Albumin: 3.8 g/dL (ref 3.5–5.2)
Alkaline Phosphatase: 55 U/L (ref 39–117)
BUN: 14 mg/dL (ref 6–23)
CO2: 27 mEq/L (ref 19–32)
Calcium: 8.5 mg/dL (ref 8.4–10.5)
Chloride: 103 mEq/L (ref 96–112)
Creatinine, Ser: 1.02 mg/dL (ref 0.40–1.50)
GFR: 93.03 mL/min (ref >60.00)
Glucose, Bld: 175 mg/dL — ABNORMAL HIGH (ref 70–99)
Potassium: 3.8 mEq/L (ref 3.5–5.1)
Sodium: 137 mEq/L (ref 135–145)
Total Bilirubin: 0.8 mg/dL (ref 0.2–1.2)
Total Protein: 7.7 g/dL (ref 6.0–8.3)

## 2019-06-01 LAB — HEMOGLOBIN A1C: Hgb A1c MFr Bld: 6.4 % (ref 4.6–6.5)

## 2019-06-01 LAB — LDL CHOLESTEROL, DIRECT: Direct LDL: 76 mg/dL

## 2019-06-01 LAB — LIPID PANEL
Cholesterol: 157 mg/dL (ref 0–200)
HDL: 39 mg/dL — ABNORMAL LOW (ref >39.00)
NonHDL: 118.15
Total CHOL/HDL Ratio: 4
Triglycerides: 213 mg/dL — ABNORMAL HIGH (ref 0.0–149.0)
VLDL: 42.6 mg/dL — ABNORMAL HIGH (ref 0.0–40.0)

## 2019-06-03 MED FILL — FARXIGA 10 MG TABLET: 10 | 30 days supply | Qty: 30 | Fill #1

## 2019-06-03 MED FILL — TRESIBA FLEXTOUCH 100 UNITS: 100 | 30 days supply | Qty: 12 | Fill #8

## 2019-06-03 MED FILL — FREESTYLE LIBRE 14 DAY SENS: 28 days supply | Qty: 2 | Fill #3

## 2019-06-03 NOTE — Progress Notes (Signed)
Patient ID: Sean Schaefer, male   DOB: 1966-01-29, 53 y.o.   MRN: [id number]           Reason for Appointment: Follow-up for Type 2 Diabetes  Referring PCP: Merri Ray   History of Present Illness:          Date of diagnosis of type 2 diabetes mellitus: 2010 approximately       Background history:  His A1c range since 2016 has been 8.3-13.3 In the past he has been treated with glipizide Farxiga and more recently insulin since about 2019 but only basal insulin However prior to this he thinks he has been doing much better with his exercise regimen and diet and had A1c is below 6.5 also Had a high A1c of 11.6 previously in 12/19  Recent history:   His A1c is relatively higher at 6.4 compared to 6.0 Baseline fructosamine 339  INSULIN regimen is:  TRESIBA 40 units at night, Humalog 10 units breakfast --5 units AC dinner daily       Non-insulin hypoglycemic drugs the patient is taking are: Farxiga 10 mg daily at dinnertime, metformin ER 500 bid mg daily  Current management, blood sugar patterns and problems identified:  He again appears to have relatively low readings on his freestyle libre compared to his actual blood sugars in the lab  He thinks his blood sugars are not as consistently controlled because of inconsistent diet, lack of consistent exercise  Also during 1 week he did not take his Metformin  Previously had gained weight and this is about the same  As before his blood sugar may be higher after meals based on his diet  However with taking 10 units of Humalog for his oatmeal in the morning his blood sugars are well controlled  Currently not taking insulin for lunch but usually does not have excessive rise in blood sugar with this meal  Other patterns are discussed below  Now taking Farxiga in the morning as directed         Side effects from medications have been: None     Meal times are:  Breakfast is at 8 AM lunch: 1130 aM dinner: 6-7 PM                 Glucose monitoring:  done 2-3 times a day         Glucometer: Freestyle      CONTINUOUS GLUCOSE MONITORING RECORD INTERPRETATION    Dates of Recording: 10/31 through 11/13  Sensor description: [employer]  Results statistics:   CGM use % of time  71  Average and SD  139, GV 25  Time in range      89 %  % Time Above 180  11  % Time above 250 0  % Time Below target 0    Glycemic patterns summary: His blood sugars are showing mild postprandial hyperglycemia most notably after lunch but otherwise blood sugars are relatively good Hypoglycemia has been minimal   Hyperglycemic episodes are occurring mostly after lunch with only mild increase but at times will have higher readings late at night probably from snacks  Hypoglycemic episodes have not occurred with lowest reading 67 midmorning  Overnight periods: Blood sugars are relatively higher late at night although inconsistent and then decrease until 10 AM  Preprandial periods:  Blood sugars are variable before breakfast, relatively stable at lunchtime around 127 average and mildly increased at 155 at dinnertime  Postprandial periods:   After breakfast:  Glucose is usually well controlled with average blood sugar only 18 mg higher than before meals, average 134 Also no hypoglycemia  After lunch:   Blood sugars are mostly higher although only modestly with average 155  After dinner: Glucose patterns show relatively flat average of about 157  May be having some high readings late at night from snacks in the [place] visit, most recent: 08/2018  Weight history: Highest weight previously 260  Wt Readings from Last 3 Encounters:  06/04/19 238 lb 6.4 oz (108.1 kg)  03/05/19 237 lb (107.5 kg)  11/30/18 226 lb 12.8 oz (102.9 kg)    Glycemic control:   Lab Results  Component Value Date   HGBA1C 6.4 06/01/2019   HGBA1C 6.0 03/01/2019   HGBA1C 6.6 (H) 11/26/2018   Lab Results  Component Value Date    MICROALBUR 1.2 03/05/2019   LDLCALC 105 (H) 02/04/2018   CREATININE 1.02 06/01/2019   Lab Results  Component Value Date   MICRALBCREAT 1.5 03/05/2019    Lab Results  Component Value Date   FRUCTOSAMINE 339 (H) 09/25/2018    Lab on 06/01/2019  Component Date Value Ref Range Status  . Cholesterol 06/01/2019 157  0 - 200 mg/dL Final   ATP III Classification       Desirable:  < 200 mg/dL               Borderline High:  200 - 239 mg/dL          High:  > = 240 mg/dL  . Triglycerides 06/01/2019 213.0* 0.0 - 149.0 mg/dL Final   Normal:  <150 mg/dLBorderline High:  150 - 199 mg/dL  . HDL 06/01/2019 39.00* >39.00 mg/dL Final  . VLDL 06/01/2019 42.6* 0.0 - 40.0 mg/dL Final  . Total CHOL/HDL Ratio 06/01/2019 4   Final                  Men          Women1/2 Average Risk     3.4          3.3Average Risk          5.0          4.42X Average Risk          9.6          7.13X Average Risk          15.0          11.0                      . NonHDL 06/01/2019 118.15   Final   NOTE:  Non-HDL goal should be 30 mg/dL higher than patient's LDL goal (i.e. LDL goal of < 70 mg/dL, would have non-HDL goal of < 100 mg/dL)  . Sodium 06/01/2019 137  135 - 145 mEq/L Final  . Potassium 06/01/2019 3.8  3.5 - 5.1 mEq/L Final  . Chloride 06/01/2019 103  96 - 112 mEq/L Final  . CO2 06/01/2019 27  19 - 32 mEq/L Final  . Glucose, Bld 06/01/2019 175* 70 - 99 mg/dL Final  . BUN 06/01/2019 14  6 - 23 mg/dL Final  . Creatinine, Ser 06/01/2019 1.02  0.40 - 1.50 mg/dL Final  . Total Bilirubin 06/01/2019 0.8  0.2 - 1.2 mg/dL Final  . Alkaline Phosphatase 06/01/2019 55  39 - 117 U/L Final  . AST 06/01/2019 17  0 - 37 U/L Final  .  ALT 06/01/2019 19  0 - 53 U/L Final  . Total Protein 06/01/2019 7.7  6.0 - 8.3 g/dL Final  . Albumin 06/01/2019 3.8  3.5 - 5.2 g/dL Final  . GFR 06/01/2019 93.03  >60.00 mL/min Final  . Calcium 06/01/2019 8.5  8.4 - 10.5 mg/dL Final  . Hgb A1c MFr Bld 06/01/2019 6.4  4.6 - 6.5 % Final   Glycemic  Control Guidelines for People with Diabetes:Non Diabetic:  <6%Goal of Therapy: <7%Additional Action Suggested:  >8%   . Direct LDL 06/01/2019 76.0  mg/dL Final   Optimal:  <100 mg/dLNear or Above Optimal:  100-129 mg/dLBorderline High:  130-159 mg/dLHigh:  160-189 mg/dLVery High:  >190 mg/dL    Allergies as of 06/04/2019   No Known Allergies     Medication List       Accurate as of June 04, 2019  8:26 AM. If you have any questions, ask your nurse or doctor.        STOP taking these medications   HumaLOG KwikPen 100 UNIT/ML KwikPen Generic drug: insulin lispro Stopped by: Elayne Snare, MD     TAKE these medications   atorvastatin 10 MG tablet Commonly known as: LIPITOR Take 1 tablet (10 mg total) by mouth daily.   blood glucose meter kit and supplies Dispense based on patient and insurance preference. Use up to four times daily as directed. (FOR ICD-10 E10.9, E11.9).   Farxiga 10 MG Tabs tablet Generic drug: dapagliflozin propanediol TAKE 1 TABLET BY MOUTH ONCE A DAY   FreeStyle Libre 14 Day Sensor Misc 1 Units by Does not apply route every 14 (fourteen) days.   FreeStyle Precision Neo Test test strip Generic drug: glucose blood 1 each by Other route daily. Use Freestyle Precision Neo test strips as instructed to check blood sugar once daily.   metFORMIN 500 MG tablet Commonly known as: GLUCOPHAGE Take 500 mg by mouth 2 (two) times daily. Take 1 tablet by mouth in the morning and 1 tablet in the evening.   NovoFine 32G X 6 MM Misc Generic drug: Insulin Pen Needle USE AS DIRECTED   NovoLOG FlexPen 100 UNIT/ML FlexPen Generic drug: insulin aspart Inject into the skin 2 (two) times daily with a meal. Inject 10 units under the skin at breakfast and 5 units at supper.   Tyler Aas FlexTouch 100 UNIT/ML Sopn FlexTouch Pen Generic drug: insulin degludec Inject 40 Units into the skin daily. Inject 40 units under the skin once daily.       Allergies: No Known  Allergies  Past Medical History:  Diagnosis Date  . Diabetes mellitus without complication ([place])   . Hypertension   . Ulcerative colitis [facility])     Past Surgical History:  Procedure Laterality Date  . COLON SURGERY    . Perianal fistula repair  2012    Family History  Problem Relation Age of Onset  . Hypertension Mother   . Cancer Mother   . Hypertension Father   . Diabetes Paternal Grandmother   . Diabetes Paternal Uncle   . Diabetes Paternal Grandfather   . Heart disease Neg Hx     Social History:  reports that he has never smoked. He has never used smokeless tobacco. He reports that he does not drink alcohol or use drugs.   Review of Systems  Lipid history: Has persistently high triglycerides and baseline LDL 106  He has started 10 mg atorvastatin as of 02/2019 without side effect LDL is down to 76, nonfasting triglycerides just  over 200 now and improved  Lab Results  Component Value Date   CHOL 157 06/01/2019   CHOL 204 (H) 03/01/2019   CHOL 186 11/26/2018   Lab Results  Component Value Date   HDL 39.00 (L) 06/01/2019   HDL 40.90 03/01/2019   HDL 43.20 11/26/2018   Lab Results  Component Value Date   LDLCALC 105 (H) 02/04/2018   LDLCALC 112 (H) 12/17/2017   LDLCALC 78 12/06/2016   Lab Results  Component Value Date   TRIG 213.0 (H) 06/01/2019   TRIG 232.0 (H) 03/01/2019   TRIG 279.0 (H) 11/26/2018   Lab Results  Component Value Date   CHOLHDL 4 06/01/2019   CHOLHDL 5 03/01/2019   CHOLHDL 4 11/26/2018   Lab Results  Component Value Date   LDLDIRECT 76.0 06/01/2019   LDLDIRECT 106.0 03/01/2019   LDLDIRECT 96.0 11/26/2018            Hypertension: On Farxiga only; previously did have increased blood pressure  Using arm blood pressure at home and this is usually better, recently 119/80  BP Readings from Last 3 Encounters:  06/04/19 136/88  03/05/19 122/84  11/30/18 110/80    Most recent eye exam was in 9/19  Most recent foot exam:2/20   Currently known complications of diabetes: Mild symptoms of neuropathy  LABS:  Lab on 06/01/2019  Component Date Value Ref Range Status  . Cholesterol 06/01/2019 157  0 - 200 mg/dL Final   ATP III Classification       Desirable:  < 200 mg/dL               Borderline High:  200 - 239 mg/dL          High:  > = 240 mg/dL  . Triglycerides 06/01/2019 213.0* 0.0 - 149.0 mg/dL Final   Normal:  <150 mg/dLBorderline High:  150 - 199 mg/dL  . HDL 06/01/2019 39.00* >39.00 mg/dL Final  . VLDL 06/01/2019 42.6* 0.0 - 40.0 mg/dL Final  . Total CHOL/HDL Ratio 06/01/2019 4   Final                  Men          Women1/2 Average Risk     3.4          3.3Average Risk          5.0          4.42X Average Risk          9.6          7.13X Average Risk          15.0          11.0                      . NonHDL 06/01/2019 118.15   Final   NOTE:  Non-HDL goal should be 30 mg/dL higher than patient's LDL goal (i.e. LDL goal of < 70 mg/dL, would have non-HDL goal of < 100 mg/dL)  . Sodium 06/01/2019 137  135 - 145 mEq/L Final  . Potassium 06/01/2019 3.8  3.5 - 5.1 mEq/L Final  . Chloride 06/01/2019 103  96 - 112 mEq/L Final  . CO2 06/01/2019 27  19 - 32 mEq/L Final  . Glucose, Bld 06/01/2019 175* 70 - 99 mg/dL Final  . BUN 06/01/2019 14  6 - 23 mg/dL Final  . Creatinine, Ser 06/01/2019 1.02  0.40 - 1.50 mg/dL Final  .  Total Bilirubin 06/01/2019 0.8  0.2 - 1.2 mg/dL Final  . Alkaline Phosphatase 06/01/2019 55  39 - 117 U/L Final  . AST 06/01/2019 17  0 - 37 U/L Final  . ALT 06/01/2019 19  0 - 53 U/L Final  . Total Protein 06/01/2019 7.7  6.0 - 8.3 g/dL Final  . Albumin 06/01/2019 3.8  3.5 - 5.2 g/dL Final  . GFR 06/01/2019 93.03  >60.00 mL/min Final  . Calcium 06/01/2019 8.5  8.4 - 10.5 mg/dL Final  . Hgb A1c MFr Bld 06/01/2019 6.4  4.6 - 6.5 % Final   Glycemic Control Guidelines for People with Diabetes:Non Diabetic:  <6%Goal of Therapy: <7%Additional Action Suggested:  >8%   . Direct LDL 06/01/2019 76.0   mg/dL Final   Optimal:  <100 mg/dLNear or Above Optimal:  100-129 mg/dLBorderline High:  130-159 mg/dLHigh:  160-189 mg/dLVery High:  >190 mg/dL    Physical Examination:  BP 136/88 (BP Location: Left Arm, Patient Position: Sitting, Cuff Size: Normal)   Pulse (!) 101   Ht 5' 10"  (1.778 m)   Wt 238 lb 6.4 oz (108.1 kg)   SpO2 98%   BMI 34.21 kg/m     ASSESSMENT:  Diabetes type 2 on insulin  See history of present illness for detailed discussion of current diabetes management, blood sugar patterns and problems identified  Now A1c is slightly higher at 6.4 compared to 6.0  He is benefiting from [place] as well as a basal bolus insulin regimen Now taking mealtime insulin with 2 meals a day, not with lunch currently  Freestyle libre was reviewed and discussed results and interpretation with the patient  Blood sugars are generally controlled except periodically higher after meals based on his diet Also can benefit from more regular exercise Fasting readings are variable partly dependent on whether he is having snacks late at night Reassured him that blood sugars in the upper 60s on his freestyle libre are likely falsely low because of inaccuracy  Dyslipidemia: LDL has gone down significantly and triglycerides are improving with Lipitor He is tolerating this   PLAN:    Continue Lipitor 10 mg daily  . He was reminded about need for consistent diet with cutting back on high fat foods, sweets and large carbohydrate meals . He will try to start back on his walking, he also wants to do some weightlifting and swimming for exercise . He will add another Metformin in the morning to help with daytime hyperglycemia . Also needs to consider reducing basal insulin if morning sugars start getting consistently low . Periodically check fingerstick for comparison with freestyle libre . No change in basal insulin dose . However he can take 5 to 10 units of mealtime insulin at lunch if eating a  lot of carbohydrates like sandwiches   Continue following blood pressure at home  Discussed importance of taking influenza vaccine with his having diabetes and he agrees to do this  A1c in 3 months  There are no Patient Instructions on file for this visit.  Counseling time on subjects discussed in assessment and plan sections is over 50% of today's 25 minute visit  Elayne Snare 06/04/2019, 8:26 AM   Note: This office note was prepared with Dragon voice recognition system technology. Any transcriptional errors that result from this process are unintentional.

## 2019-06-04 ENCOUNTER — Ambulatory Visit (INDEPENDENT_AMBULATORY_CARE_PROVIDER_SITE_OTHER): Payer: No Typology Code available for payment source | Admitting: Endocrinology

## 2019-06-04 ENCOUNTER — Encounter: Payer: Self-pay | Admitting: Endocrinology

## 2019-06-04 ENCOUNTER — Ambulatory Visit: Payer: No Typology Code available for payment source | Admitting: Endocrinology

## 2019-06-04 VITALS — BP 136/88 | HR 101 | Ht 70.0 in | Wt 238.4 lb

## 2019-06-04 DIAGNOSIS — E782 Mixed hyperlipidemia: Secondary | ICD-10-CM

## 2019-06-04 DIAGNOSIS — Z23 Encounter for immunization: Secondary | ICD-10-CM

## 2019-06-04 DIAGNOSIS — Z794 Long term (current) use of insulin: Secondary | ICD-10-CM | POA: Diagnosis not present

## 2019-06-04 DIAGNOSIS — E1165 Type 2 diabetes mellitus with hyperglycemia: Secondary | ICD-10-CM | POA: Diagnosis not present

## 2019-06-04 NOTE — Patient Instructions (Signed)
Take 2 Metformin in am  Check blood sugars on waking up days a week  Also check blood sugars about 2 hours after meals and do this after different meals by rotation  Recommended blood sugar levels on waking up are 90-130 and about 2 hours after meal is 130-160  Please bring your blood sugar monitor to each visit, thank you

## 2019-06-25 MED FILL — ATORVASTATIN 10 MG TABLET: 10 | 30 days supply | Qty: 30 | Fill #0

## 2019-07-07 MED FILL — FREESTYLE LIBRE 14 DAY SENS: 28 days supply | Qty: 2 | Fill #4

## 2019-07-07 MED FILL — TRESIBA FLEXTOUCH 100 UNITS: 100 | 30 days supply | Qty: 12 | Fill #9

## 2019-07-07 MED FILL — FARXIGA 10 MG TABLET: 10 | 30 days supply | Qty: 30 | Fill #2

## 2019-07-07 MED FILL — HUMALOG 100 UNITS/ML KWIKPE: 100 | 37 days supply | Qty: 9 | Fill #1

## 2019-07-21 MED FILL — metFORMIN HCL ER 500 MG TB2: 500 | 30 days supply | Qty: 120 | Fill #2

## 2019-08-04 MED FILL — FARXIGA 10 MG TABLET: 10 | 30 days supply | Qty: 30 | Fill #3

## 2019-08-05 ENCOUNTER — Other Ambulatory Visit: Payer: Self-pay | Admitting: Endocrinology

## 2019-08-05 MED FILL — FREESTYLE LIBRE 14 DAY SENS: 28 days supply | Qty: 2 | Fill #0

## 2019-09-06 MED FILL — metFORMIN HCL ER 500 MG TB2: 500 | 30 days supply | Qty: 120 | Fill #3

## 2019-09-07 ENCOUNTER — Other Ambulatory Visit: Payer: Self-pay

## 2019-09-07 ENCOUNTER — Other Ambulatory Visit: Payer: Self-pay | Admitting: Endocrinology

## 2019-09-07 ENCOUNTER — Other Ambulatory Visit (INDEPENDENT_AMBULATORY_CARE_PROVIDER_SITE_OTHER): Payer: No Typology Code available for payment source

## 2019-09-07 DIAGNOSIS — E1165 Type 2 diabetes mellitus with hyperglycemia: Secondary | ICD-10-CM | POA: Diagnosis not present

## 2019-09-07 DIAGNOSIS — Z794 Long term (current) use of insulin: Secondary | ICD-10-CM

## 2019-09-07 LAB — BASIC METABOLIC PANEL
BUN: 14 mg/dL (ref 6–23)
CO2: 30 mEq/L (ref 19–32)
Calcium: 8.7 mg/dL (ref 8.4–10.5)
Chloride: 101 mEq/L (ref 96–112)
Creatinine, Ser: 1.09 mg/dL (ref 0.40–1.50)
GFR: 86.08 mL/min (ref >60.00)
Glucose, Bld: 193 mg/dL — ABNORMAL HIGH (ref 70–99)
Potassium: 4 mEq/L (ref 3.5–5.1)
Sodium: 136 mEq/L (ref 135–145)

## 2019-09-07 LAB — HEMOGLOBIN A1C: Hgb A1c MFr Bld: 7.6 % — ABNORMAL HIGH (ref 4.6–6.5)

## 2019-09-07 MED FILL — FARXIGA 10 MG TABLET: 10 | 30 days supply | Qty: 30 | Fill #0

## 2019-09-10 ENCOUNTER — Ambulatory Visit: Payer: No Typology Code available for payment source | Admitting: Endocrinology

## 2019-09-10 MED FILL — FREESTYLE LIBRE 14 DAY SENS: 28 days supply | Qty: 2 | Fill #1

## 2019-10-04 MED FILL — LOSARTAN POTASSIUM 25 MG TA: 25 | 30 days supply | Qty: 30 | Fill #0

## 2019-10-19 ENCOUNTER — Other Ambulatory Visit: Payer: Self-pay | Admitting: Endocrinology

## 2019-10-19 MED FILL — FREESTYLE LIBRE 14 DAY SENS: 28 days supply | Qty: 2 | Fill #0

## 2019-10-19 MED FILL — TRESIBA FLEXTOUCH 100 UNITS: 100 | 30 days supply | Qty: 12 | Fill #0

## 2019-10-19 MED FILL — HUMALOG 100 UNITS/ML KWIKPE: 100 | 75 days supply | Qty: 18 | Fill #0

## 2019-10-19 MED FILL — FARXIGA 10 MG TABLET: 10 | 30 days supply | Qty: 30 | Fill #0

## 2019-11-30 ENCOUNTER — Other Ambulatory Visit: Payer: Self-pay | Admitting: Endocrinology

## 2019-12-02 ENCOUNTER — Other Ambulatory Visit: Payer: Self-pay | Admitting: Endocrinology

## 2019-12-02 DIAGNOSIS — E782 Mixed hyperlipidemia: Secondary | ICD-10-CM

## 2019-12-02 DIAGNOSIS — Z794 Long term (current) use of insulin: Secondary | ICD-10-CM

## 2019-12-02 DIAGNOSIS — E1165 Type 2 diabetes mellitus with hyperglycemia: Secondary | ICD-10-CM

## 2019-12-10 ENCOUNTER — Other Ambulatory Visit (INDEPENDENT_AMBULATORY_CARE_PROVIDER_SITE_OTHER): Payer: No Typology Code available for payment source

## 2019-12-10 ENCOUNTER — Other Ambulatory Visit: Payer: Self-pay

## 2019-12-10 DIAGNOSIS — E1165 Type 2 diabetes mellitus with hyperglycemia: Secondary | ICD-10-CM

## 2019-12-10 DIAGNOSIS — E782 Mixed hyperlipidemia: Secondary | ICD-10-CM

## 2019-12-10 DIAGNOSIS — Z794 Long term (current) use of insulin: Secondary | ICD-10-CM

## 2019-12-10 LAB — COMPREHENSIVE METABOLIC PANEL
ALT: 13 U/L (ref 0–53)
AST: 16 U/L (ref 0–37)
Albumin: 3.7 g/dL (ref 3.5–5.2)
Alkaline Phosphatase: 51 U/L (ref 39–117)
BUN: 17 mg/dL (ref 6–23)
CO2: 31 mEq/L (ref 19–32)
Calcium: 9.2 mg/dL (ref 8.4–10.5)
Chloride: 103 mEq/L (ref 96–112)
Creatinine, Ser: 0.99 mg/dL (ref 0.40–1.50)
GFR: 96.1 mL/min (ref >60.00)
Glucose, Bld: 196 mg/dL — ABNORMAL HIGH (ref 70–99)
Potassium: 4.1 mEq/L (ref 3.5–5.1)
Sodium: 136 mEq/L (ref 135–145)
Total Bilirubin: 0.7 mg/dL (ref 0.2–1.2)
Total Protein: 7.9 g/dL (ref 6.0–8.3)

## 2019-12-10 LAB — LIPID PANEL
Cholesterol: 186 mg/dL (ref 0–200)
HDL: 33.9 mg/dL — ABNORMAL LOW (ref >39.00)
LDL Cholesterol: 113 mg/dL — ABNORMAL HIGH (ref 0–99)
NonHDL: 151.65
Total CHOL/HDL Ratio: 5
Triglycerides: 194 mg/dL — ABNORMAL HIGH (ref 0.0–149.0)
VLDL: 38.8 mg/dL (ref 0.0–40.0)

## 2019-12-10 LAB — HEMOGLOBIN A1C: Hgb A1c MFr Bld: 9.4 % — ABNORMAL HIGH (ref 4.6–6.5)

## 2019-12-13 MED FILL — metFORMIN HCL ER 500 MG TB2: 500 | 90 days supply | Qty: 360 | Fill #0

## 2019-12-13 MED FILL — FREESTYLE LIBRE 14 DAY SENS: 28 days supply | Qty: 2 | Fill #1

## 2019-12-14 ENCOUNTER — Ambulatory Visit: Payer: No Typology Code available for payment source | Admitting: Endocrinology

## 2019-12-21 ENCOUNTER — Ambulatory Visit: Payer: No Typology Code available for payment source | Admitting: Endocrinology

## 2019-12-21 DIAGNOSIS — Z0289 Encounter for other administrative examinations: Secondary | ICD-10-CM

## 2020-01-10 ENCOUNTER — Other Ambulatory Visit: Payer: Self-pay | Admitting: Endocrinology

## 2020-01-19 MED FILL — FARXIGA 10 MG TABLET: 10 | 30 days supply | Qty: 30 | Fill #0

## 2020-07-26 ENCOUNTER — Other Ambulatory Visit (HOSPITAL_COMMUNITY): Payer: Self-pay | Admitting: Dentistry

## 2020-07-26 MED FILL — HYDROCODON-APAP 5-325: 5-325 | 2 days supply | Qty: 10 | Fill #0

## 2020-07-26 MED FILL — AMOXICILLIN 875 MG TABS: 875 | 10 days supply | Qty: 20 | Fill #0

## 2020-10-30 ENCOUNTER — Other Ambulatory Visit (HOSPITAL_COMMUNITY): Payer: Self-pay

## 2020-10-30 MED ORDER — VALSARTAN 40 MG PO TABS
ORAL_TABLET | ORAL | 3 refills | Status: DC
  Filled 2020-10-30: qty 90, 90d supply, fill #0

## 2020-11-07 ENCOUNTER — Other Ambulatory Visit (HOSPITAL_COMMUNITY): Payer: Self-pay

## 2020-11-14 ENCOUNTER — Other Ambulatory Visit (HOSPITAL_COMMUNITY): Payer: Self-pay

## 2020-11-14 MED ORDER — HYDROCODONE-ACETAMINOPHEN 5-325 MG PO TABS
2.0000 | ORAL_TABLET | ORAL | 0 refills | Status: DC | PRN
  Filled 2020-11-14: qty 12, 2d supply, fill #0

## 2020-11-30 ENCOUNTER — Other Ambulatory Visit: Payer: Self-pay

## 2020-11-30 ENCOUNTER — Encounter (HOSPITAL_COMMUNITY): Payer: Self-pay

## 2020-11-30 ENCOUNTER — Observation Stay (HOSPITAL_COMMUNITY): Payer: No Typology Code available for payment source | Admitting: Certified Registered Nurse Anesthetist

## 2020-11-30 ENCOUNTER — Encounter (HOSPITAL_COMMUNITY): Admission: EM | Disposition: A | Discharge status: Home / Self Care | Payer: Self-pay | Source: Home / Self Care

## 2020-11-30 ENCOUNTER — Inpatient Hospital Stay (HOSPITAL_COMMUNITY)
Admission: EM | Admit: 2020-11-30 | Discharge: 2020-12-02 | DRG: 603 | Disposition: A | Discharge status: Home / Self Care | Payer: No Typology Code available for payment source | Attending: General Surgery | Admitting: General Surgery

## 2020-11-30 DIAGNOSIS — I10 Essential (primary) hypertension: Secondary | ICD-10-CM | POA: Diagnosis present

## 2020-11-30 DIAGNOSIS — L03317 Cellulitis of buttock: Principal | ICD-10-CM | POA: Diagnosis present

## 2020-11-30 DIAGNOSIS — E119 Type 2 diabetes mellitus without complications: Secondary | ICD-10-CM | POA: Diagnosis present

## 2020-11-30 DIAGNOSIS — K611 Rectal abscess: Secondary | ICD-10-CM | POA: Diagnosis present

## 2020-11-30 DIAGNOSIS — Z8249 Family history of ischemic heart disease and other diseases of the circulatory system: Secondary | ICD-10-CM

## 2020-11-30 DIAGNOSIS — L0231 Cutaneous abscess of buttock: Secondary | ICD-10-CM | POA: Diagnosis present

## 2020-11-30 DIAGNOSIS — Z833 Family history of diabetes mellitus: Secondary | ICD-10-CM

## 2020-11-30 DIAGNOSIS — Z20822 Contact with and (suspected) exposure to covid-19: Secondary | ICD-10-CM | POA: Diagnosis present

## 2020-11-30 HISTORY — PX: INCISION AND DRAINAGE PERIRECTAL ABSCESS: SHX1804

## 2020-11-30 LAB — CBC WITH DIFFERENTIAL/PLATELET
Abs Immature Granulocytes: 0.1 10*3/uL — ABNORMAL HIGH (ref 0.00–0.07)
Basophils Absolute: 0.1 10*3/uL (ref 0.0–0.1)
Basophils Relative: 0 %
Eosinophils Absolute: 0.2 10*3/uL (ref 0.0–0.5)
Eosinophils Relative: 1 %
HCT: 47.2 % (ref 39.0–52.0)
Hemoglobin: 15.9 g/dL (ref 13.0–17.0)
Immature Granulocytes: 1 %
Lymphocytes Relative: 8 %
Lymphs Abs: 1.4 10*3/uL (ref 0.7–4.0)
MCH: 29.2 pg (ref 26.0–34.0)
MCHC: 33.7 g/dL (ref 30.0–36.0)
MCV: 86.8 fL (ref 80.0–100.0)
Monocytes Absolute: 1.9 10*3/uL — ABNORMAL HIGH (ref 0.1–1.0)
Monocytes Relative: 11 %
Neutro Abs: 13.3 10*3/uL — ABNORMAL HIGH (ref 1.7–7.7)
Neutrophils Relative %: 79 %
Platelets: 255 10*3/uL (ref 150–400)
RBC: 5.44 MIL/uL (ref 4.22–5.81)
RDW: 11.6 % (ref 11.5–15.5)
WBC: 17 10*3/uL — ABNORMAL HIGH (ref 4.0–10.5)
nRBC: 0 % (ref 0.0–0.2)

## 2020-11-30 LAB — APTT: aPTT: 32 seconds (ref 24–36)

## 2020-11-30 LAB — RESP PANEL BY RT-PCR (FLU A&B, COVID) ARPGX2
Influenza A by PCR: NEGATIVE
Influenza B by PCR: NEGATIVE
SARS Coronavirus 2 by RT PCR: NEGATIVE

## 2020-11-30 LAB — BASIC METABOLIC PANEL
Anion gap: 12 (ref 5–15)
BUN: 14 mg/dL (ref 6–20)
CO2: 22 mmol/L (ref 22–32)
Calcium: 8.8 mg/dL — ABNORMAL LOW (ref 8.9–10.3)
Chloride: 94 mmol/L — ABNORMAL LOW (ref 98–111)
Creatinine, Ser: 1.47 mg/dL — ABNORMAL HIGH (ref 0.61–1.24)
GFR, Estimated: 57 mL/min — ABNORMAL LOW (ref >60)
Glucose, Bld: 309 mg/dL — ABNORMAL HIGH (ref 70–99)
Potassium: 4.2 mmol/L (ref 3.5–5.1)
Sodium: 128 mmol/L — ABNORMAL LOW (ref 135–145)

## 2020-11-30 LAB — GLUCOSE, CAPILLARY
Glucose-Capillary: 302 mg/dL — ABNORMAL HIGH (ref 70–99)
Glucose-Capillary: 280 mg/dL — ABNORMAL HIGH (ref 70–99)

## 2020-11-30 LAB — HIV ANTIBODY (ROUTINE TESTING W REFLEX): HIV Screen 4th Generation wRfx: NONREACTIVE

## 2020-11-30 LAB — PROTIME-INR
INR: 1.1 (ref 0.8–1.2)
Prothrombin Time: 13.9 seconds (ref 11.4–15.2)

## 2020-11-30 SURGERY — INCISION AND DRAINAGE, ABSCESS, PERIRECTAL
Anesthesia: General | Site: Perineum

## 2020-11-30 MED ORDER — ATORVASTATIN CALCIUM 10 MG PO TABS
10.0000 mg | ORAL_TABLET | Freq: Every day | ORAL | Status: DC
  Administered 2020-12-01: 10 mg via ORAL
  Filled 2020-11-30 (×2): qty 1

## 2020-11-30 MED ORDER — INSULIN GLARGINE 100 UNIT/ML ~~LOC~~ SOLN
30.0000 [IU] | Freq: Every day | SUBCUTANEOUS | Status: DC
  Administered 2020-11-30 – 2020-12-01 (×2): 30 [IU] via SUBCUTANEOUS
  Filled 2020-11-30 (×3): qty 0.3

## 2020-11-30 MED ORDER — PROPOFOL 10 MG/ML IV BOLUS
INTRAVENOUS | Status: DC | PRN
  Administered 2020-11-30: 170 mg via INTRAVENOUS

## 2020-11-30 MED ORDER — CHLORHEXIDINE GLUCONATE 4 % EX LIQD
1.0000 "application " | Freq: Once | CUTANEOUS | Status: DC
  Filled 2020-11-30: qty 15

## 2020-11-30 MED ORDER — ENOXAPARIN SODIUM 40 MG/0.4ML IJ SOSY
40.0000 mg | PREFILLED_SYRINGE | INTRAMUSCULAR | Status: DC
  Administered 2020-11-30 – 2020-12-01 (×2): 40 mg via SUBCUTANEOUS
  Filled 2020-11-30 (×2): qty 0.4

## 2020-11-30 MED ORDER — PIPERACILLIN-TAZOBACTAM 3.375 G IVPB 30 MIN
3.3750 g | Freq: Once | INTRAVENOUS | Status: DC
  Filled 2020-11-30: qty 50

## 2020-11-30 MED ORDER — HYDROMORPHONE HCL 1 MG/ML IJ SOLN
0.5000 mg | INTRAMUSCULAR | Status: DC | PRN
  Administered 2020-11-30: 0.5 mg via INTRAVENOUS
  Filled 2020-11-30: qty 1

## 2020-11-30 MED ORDER — ACETAMINOPHEN 500 MG PO TABS
1000.0000 mg | ORAL_TABLET | Freq: Three times a day (TID) | ORAL | Status: DC
  Administered 2020-11-30 – 2020-12-02 (×6): 1000 mg via ORAL
  Filled 2020-11-30 (×6): qty 2

## 2020-11-30 MED ORDER — INSULIN ASPART 100 UNIT/ML IJ SOLN
INTRAMUSCULAR | Status: AC
  Filled 2020-11-30: qty 1

## 2020-11-30 MED ORDER — DOCUSATE SODIUM 100 MG PO CAPS
100.0000 mg | ORAL_CAPSULE | Freq: Two times a day (BID) | ORAL | Status: DC
  Administered 2020-12-01: 100 mg via ORAL
  Filled 2020-11-30 (×2): qty 1

## 2020-11-30 MED ORDER — ONDANSETRON HCL 4 MG/2ML IJ SOLN
4.0000 mg | Freq: Once | INTRAMUSCULAR | Status: DC | PRN
Start: 2020-11-30 — End: 2020-11-30

## 2020-11-30 MED ORDER — FENTANYL CITRATE (PF) 250 MCG/5ML IJ SOLN
INTRAMUSCULAR | Status: AC
  Filled 2020-11-30: qty 5

## 2020-11-30 MED ORDER — OXYCODONE HCL 5 MG PO TABS: 5.0000 mg | ORAL_TABLET | Freq: Once | ORAL | Status: DC | PRN

## 2020-11-30 MED ORDER — SODIUM CHLORIDE 0.9 % IV BOLUS: 1000.0000 mL | Freq: Once | INTRAVENOUS | Status: DC

## 2020-11-30 MED ORDER — LIDOCAINE HCL (CARDIAC) PF 100 MG/5ML IV SOSY
PREFILLED_SYRINGE | INTRAVENOUS | Status: DC | PRN
  Administered 2020-11-30: 40 mg via INTRAVENOUS

## 2020-11-30 MED ORDER — 0.9 % SODIUM CHLORIDE (POUR BTL) OPTIME
TOPICAL | Status: DC | PRN
  Administered 2020-11-30: 1000 mL

## 2020-11-30 MED ORDER — MIDAZOLAM HCL 5 MG/5ML IJ SOLN
INTRAMUSCULAR | Status: DC | PRN
  Administered 2020-11-30: 2 mg via INTRAVENOUS

## 2020-11-30 MED ORDER — OXYCODONE HCL 5 MG/5ML PO SOLN: 5.0000 mg | Freq: Once | ORAL | Status: DC | PRN

## 2020-11-30 MED ORDER — MIDAZOLAM HCL 2 MG/2ML IJ SOLN
INTRAMUSCULAR | Status: AC
  Filled 2020-11-30: qty 2

## 2020-11-30 MED ORDER — LACTATED RINGERS IV SOLN: INTRAVENOUS | Status: DC | PRN

## 2020-11-30 MED ORDER — PIPERACILLIN-TAZOBACTAM 3.375 G IVPB
3.3750 g | Freq: Three times a day (TID) | INTRAVENOUS | Status: DC
  Administered 2020-11-30 – 2020-12-02 (×6): 3.375 g via INTRAVENOUS
  Filled 2020-11-30 (×5): qty 50

## 2020-11-30 MED ORDER — OXYCODONE HCL 5 MG PO TABS
5.0000 mg | ORAL_TABLET | ORAL | Status: DC | PRN
Start: 2020-11-30 — End: 2020-12-01
  Administered 2020-11-30 – 2020-12-01 (×2): 10 mg via ORAL
  Filled 2020-11-30 (×2): qty 2

## 2020-11-30 MED ORDER — ACETAMINOPHEN 500 MG PO TABS
1000.0000 mg | ORAL_TABLET | Freq: Once | ORAL | Status: AC
  Administered 2020-11-30: 1000 mg via ORAL
  Filled 2020-11-30: qty 2

## 2020-11-30 MED ORDER — INSULIN ASPART 100 UNIT/ML IJ SOLN
0.0000 [IU] | INTRAMUSCULAR | Status: DC
  Administered 2020-12-01: 8 [IU] via SUBCUTANEOUS
  Administered 2020-12-01 (×2): 5 [IU] via SUBCUTANEOUS
  Administered 2020-12-01: 8 [IU] via SUBCUTANEOUS
  Administered 2020-12-01: 5 [IU] via SUBCUTANEOUS
  Administered 2020-12-01: 8 [IU] via SUBCUTANEOUS
  Administered 2020-12-02: 2 [IU] via SUBCUTANEOUS
  Administered 2020-12-02: 5 [IU] via SUBCUTANEOUS
  Administered 2020-12-02: 3 [IU] via SUBCUTANEOUS
  Administered 2020-12-02: 5 [IU] via SUBCUTANEOUS

## 2020-11-30 MED ORDER — LACTATED RINGERS IV SOLN: INTRAVENOUS | Status: DC

## 2020-11-30 MED ORDER — ONDANSETRON HCL 4 MG/2ML IJ SOLN
INTRAMUSCULAR | Status: AC
  Filled 2020-11-30: qty 2

## 2020-11-30 MED ORDER — ONDANSETRON HCL 4 MG/2ML IJ SOLN: 4.0000 mg | Freq: Four times a day (QID) | INTRAMUSCULAR | Status: DC | PRN

## 2020-11-30 MED ORDER — DEXMEDETOMIDINE (PRECEDEX) IN NS 20 MCG/5ML (4 MCG/ML) IV SYRINGE
PREFILLED_SYRINGE | INTRAVENOUS | Status: DC | PRN
  Administered 2020-11-30 (×3): 4 ug via INTRAVENOUS

## 2020-11-30 MED ORDER — DEXAMETHASONE SODIUM PHOSPHATE 10 MG/ML IJ SOLN
INTRAMUSCULAR | Status: AC
  Filled 2020-11-30: qty 1

## 2020-11-30 MED ORDER — FENTANYL CITRATE (PF) 100 MCG/2ML IJ SOLN
INTRAMUSCULAR | Status: DC | PRN
  Administered 2020-11-30: 50 ug via INTRAVENOUS
  Administered 2020-11-30: 100 ug via INTRAVENOUS

## 2020-11-30 MED ORDER — PHENYLEPHRINE HCL (PRESSORS) 10 MG/ML IV SOLN
INTRAVENOUS | Status: DC | PRN
  Administered 2020-11-30: 80 ug via INTRAVENOUS

## 2020-11-30 MED ORDER — DEXAMETHASONE SODIUM PHOSPHATE 4 MG/ML IJ SOLN
INTRAMUSCULAR | Status: DC | PRN
  Administered 2020-11-30: 4 mg via INTRAVENOUS

## 2020-11-30 MED ORDER — INSULIN ASPART 100 UNIT/ML IJ SOLN
INTRAMUSCULAR | Status: DC | PRN
  Administered 2020-11-30: 8 [IU] via SUBCUTANEOUS

## 2020-11-30 MED ORDER — SUGAMMADEX SODIUM 200 MG/2ML IV SOLN
INTRAVENOUS | Status: DC | PRN
  Administered 2020-11-30: 250 mg via INTRAVENOUS

## 2020-11-30 MED ORDER — ONDANSETRON 4 MG PO TBDP: 4.0000 mg | ORAL_TABLET | Freq: Four times a day (QID) | ORAL | Status: DC | PRN

## 2020-11-30 MED ORDER — ONDANSETRON HCL 4 MG/2ML IJ SOLN
INTRAMUSCULAR | Status: DC | PRN
  Administered 2020-11-30: 4 mg via INTRAVENOUS

## 2020-11-30 MED ORDER — SODIUM CHLORIDE 0.9 % IV SOLN: INTRAVENOUS | Status: DC

## 2020-11-30 MED ORDER — ROCURONIUM BROMIDE 100 MG/10ML IV SOLN
INTRAVENOUS | Status: DC | PRN
  Administered 2020-11-30: 60 mg via INTRAVENOUS

## 2020-11-30 MED ORDER — FENTANYL CITRATE (PF) 100 MCG/2ML IJ SOLN: 25.0000 ug | INTRAMUSCULAR | Status: DC | PRN

## 2020-11-30 MED ORDER — PROPOFOL 10 MG/ML IV BOLUS
INTRAVENOUS | Status: AC
  Filled 2020-11-30: qty 20

## 2020-11-30 MED ORDER — SODIUM CHLORIDE 0.9 % IV BOLUS
1000.0000 mL | Freq: Once | INTRAVENOUS | Status: AC
  Administered 2020-11-30: 1000 mL via INTRAVENOUS

## 2020-11-30 MED ORDER — PHENYLEPHRINE 40 MCG/ML (10ML) SYRINGE FOR IV PUSH (FOR BLOOD PRESSURE SUPPORT)
PREFILLED_SYRINGE | INTRAVENOUS | Status: AC
  Filled 2020-11-30: qty 10

## 2020-11-30 MED ORDER — ROCURONIUM BROMIDE 10 MG/ML (PF) SYRINGE
PREFILLED_SYRINGE | INTRAVENOUS | Status: AC
  Filled 2020-11-30: qty 10

## 2020-11-30 SURGICAL SUPPLY — 32 items
BNDG GAUZE ELAST 4 BULKY (GAUZE/BANDAGES/DRESSINGS) IMPLANT
COVER MAYO STAND STRL (DRAPES) ×2 IMPLANT
COVER SURGICAL LIGHT HANDLE (MISCELLANEOUS) ×2 IMPLANT
COVER WAND RF STERILE (DRAPES) ×2 IMPLANT
DRAIN PENROSE 0.25X18 (DRAIN) ×1 IMPLANT
ELECT CAUTERY BLADE 6.4 (BLADE) ×2 IMPLANT
ELECT REM PT RETURN 9FT ADLT (ELECTROSURGICAL) ×2
ELECTRODE REM PT RTRN 9FT ADLT (ELECTROSURGICAL) ×1 IMPLANT
GAUZE SPONGE 4X4 12PLY STRL LF (GAUZE/BANDAGES/DRESSINGS) ×1 IMPLANT
GLOVE BIOGEL PI IND STRL 6 (GLOVE) ×1 IMPLANT
GLOVE BIOGEL PI INDICATOR 6 (GLOVE) ×1
GLOVE BIOGEL PI MICRO 5.5 (GLOVE) ×1
GLOVE BIOGEL PI MICRO STRL 5.5 (GLOVE) ×1 IMPLANT
GOWN STRL REUS W/ TWL LRG LVL3 (GOWN DISPOSABLE) ×2 IMPLANT
GOWN STRL REUS W/TWL LRG LVL3 (GOWN DISPOSABLE) ×4
KIT BASIN OR (CUSTOM PROCEDURE TRAY) ×2 IMPLANT
KIT TURNOVER KIT B (KITS) ×2 IMPLANT
NS IRRIG 1000ML POUR BTL (IV SOLUTION) ×2 IMPLANT
PACK GENERAL/GYN (CUSTOM PROCEDURE TRAY) ×1 IMPLANT
PACK LITHOTOMY IV (CUSTOM PROCEDURE TRAY) IMPLANT
PAD ARMBOARD 7.5X6 YLW CONV (MISCELLANEOUS) ×2 IMPLANT
PENCIL SMOKE EVACUATOR (MISCELLANEOUS) ×2 IMPLANT
SPONGE LAP 18X18 RF (DISPOSABLE) IMPLANT
SURGILUBE 2OZ TUBE FLIPTOP (MISCELLANEOUS) ×2 IMPLANT
SUT ETHILON 2 0 FS 18 (SUTURE) ×1 IMPLANT
SWAB COLLECTION DEVICE MRSA (MISCELLANEOUS) ×1 IMPLANT
SWAB CULTURE ESWAB REG 1ML (MISCELLANEOUS) ×1 IMPLANT
SYR BULB EAR ULCER 3OZ GRN STR (SYRINGE) ×2 IMPLANT
TOWEL GREEN STERILE (TOWEL DISPOSABLE) ×2 IMPLANT
TOWEL GREEN STERILE FF (TOWEL DISPOSABLE) ×2 IMPLANT
TUBE CONNECTING 12X1/4 (SUCTIONS) ×2 IMPLANT
YANKAUER SUCT BULB TIP NO VENT (SUCTIONS) ×2 IMPLANT

## 2020-11-30 NOTE — Anesthesia Procedure Notes (Signed)
Procedure Name: Intubation Date/Time: 11/30/2020 8:02 PM Performed by: Oletta Lamas, CRNA Pre-anesthesia Checklist: Patient identified, Emergency Drugs available, Suction available and Patient being monitored Patient Re-evaluated:Patient Re-evaluated prior to induction Oxygen Delivery Method: Circle System Utilized Preoxygenation: Pre-oxygenation with 100% oxygen Induction Type: IV induction Ventilation: Mask ventilation without difficulty Laryngoscope Size: Mac and 4 Grade View: Grade I Tube type: Oral Number of attempts: 1 Airway Equipment and Method: Stylet Placement Confirmation: ETT inserted through vocal cords under direct vision,  positive ETCO2 and breath sounds checked- equal and bilateral Secured at: 22 cm Tube secured with: Tape Dental Injury: Teeth and Oropharynx as per pre-operative assessment

## 2020-11-30 NOTE — Op Note (Signed)
Date: 11/30/20  Patient: Sean Schaefer MRN: [id number]  Preoperative Diagnosis: Perirectal abscess Postoperative Diagnosis: Left gluteal abscess  Procedure: Rectal exam under anesthesia, incision and drainage of left gluteal abscess  Surgeon: Michaelle Birks, MD  EBL: Minimal  Anesthesia: General  Specimens: Wound culture  Indications: Mr. 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 He presented to urgent clinic today with worsening perirectal pain with minimal drainage for the last 5 days. He was noted to have significant cellulitis on exam with multiple areas of fluctuance. He has also been febrile and tachycardic. He was admitted and brought urgently to the OR.  Findings: Multiple areas of purulent drainage on the left gluteus but no large abscess cavity identified. Perianal and gluteal cellulitis. No obvious internal fistula opening visualized.  Procedure details: Informed consent was obtained in the preoperative area prior to the procedure. The patient was brought to the operating room and placed on the table in the supine position. General anesthesia was induced and the patient was placed in the lithotomy position. Perioperative antibiotics were administered per SCIP guidelines. The patient was prepped and draped in the usual sterile fashion. A pre-procedure timeout was taken verifying patient identity, surgical site and procedure to be performed.  A digital rectal exam was performed. The ileoanal anastomosis was patent, and there were no obvious areas of fluctuance palpated adjacent to the J pouch. A Hill-Ferguson retractor was placed. There were no obvious internal fistulous openings visible. The retractor was removed. The previous I&D site just to the left of the anal verge was extended slightly and the wound was probed with a kelly clamp. There  was only minimal purulent drainage but no large abscess cavity. More laterally on the left gluteus there was an area of significant induration with some fluctuance. This was incised with an 11-blade scalpel and there was return of a small volume of purulent drainage. Wound cultures were taken. The wound was probed and tracked anteriorly, but not towards the rectum and not deep. A counter incision was made anterior to the first incision and a 1/4-in penrose was placed through these incisions to keep this tract open and promote continued drainage. The penrose was looped and the ends secured together with a 2-0 Nylon suture. The wound was irrigated with saline and appeared hemostatic. No other areas of fluctuance were identified. There was a scar in the perineum just posterior to the scrotum, but no signs of active drainage. A clean gauze dressing was placed.  The patient tolerated the procedure well with no apparent complications. All counts were correct x2 at the end of the procedure. The patient was extubated and taken to PACU in stable condition.  Michaelle Birks, MD 11/30/20 9:52 PM

## 2020-11-30 NOTE — ED Provider Notes (Addendum)
Emergency Medicine Provider Triage Evaluation Note  Sean Schaefer , a 55 y.o. male  was evaluated in triage.  Pt complains of abscesses to the perineum. He was seen by surgery today and sent here for further eval. Denies fever.   Review of Systems  Positive: abscess Negative: fever  Physical Exam  BP 111/63 (BP Location: Right Arm)   Pulse (!) 145   Temp (!) 102.3 F (39.1 C) (Oral)   Resp (!) 22   SpO2 98%  Gen:   Awake, no distress   Resp:  Normal effort  MSK:   Moves extremities without difficulty  Other:  Large area of induration, warmth and ttp to the right buttock, no crepitus noted  Medical Decision Making  Medically screening exam initiated at 4:23 PM.  Appropriate orders placed.  Sean Schaefer was informed that the remainder of the evaluation will be completed by another provider, this initial triage assessment does not replace that evaluation, and the importance of remaining in the ED until their evaluation is complete.  With tachycardic, febrile. BP currently stable.  Sepsis orders initiated. Nursing made aware pt needs to be next for room.   4;30 Pm received call from Dr. Michaelle Birks with [facility] who states that she saw patient in the office PTA. The plan was to direct admit pt for surgery however there were no beds available so it was felt to be more appropriate for pt to be seen in the ED. She states that general surgery will admit the patient and she will place admission orders.    Rodney Booze, PA-C 11/30/20 1623    Jorey Dollard S, PA-C 11/30/20 [place], Ankit, MD 11/30/20 1807

## 2020-11-30 NOTE — Transfer of Care (Signed)
Immediate Anesthesia Transfer of Care Note  Patient: Sean Schaefer  Procedure(s) Performed: IRRIGATION AND DEBRIDEMENT PERIRECTAL ABSCESS (N/A Perineum)  Patient Location: PACU  Anesthesia Type:General  Level of Consciousness: awake, alert , oriented and patient cooperative  Airway & Oxygen Therapy: Patient Spontanous Breathing  Post-op Assessment: Report given to RN and Post -op Vital signs reviewed and stable  Post vital signs: Reviewed and stable  Last Vitals:  Vitals Value Taken Time  BP 125/77 11/30/20 2044  Temp    Pulse 101 11/30/20 2047  Resp 19 11/30/20 2047  SpO2 96 % 11/30/20 2047  Vitals shown include unvalidated device data.  Last Pain:  Vitals:   11/30/20 1800  TempSrc:   PainSc: 5          Complications: No complications documented.

## 2020-11-30 NOTE — Anesthesia Preprocedure Evaluation (Signed)
Anesthesia Evaluation  Patient identified by MRN, date of birth, ID band Patient awake    Reviewed: Allergy & Precautions, NPO status , Patient's Chart, lab work & pertinent test results  Airway Mallampati: II  TM Distance: >3 FB Neck ROM: Full    Dental  (+) Teeth Intact, Dental Advisory Given   Pulmonary    breath sounds clear to auscultation       Cardiovascular hypertension,  Rhythm:Regular Rate:Normal     Neuro/Psych    GI/Hepatic   Endo/Other  diabetes  Renal/GU      Musculoskeletal   Abdominal   Peds  Hematology   Anesthesia Other Findings   Reproductive/Obstetrics                             Anesthesia Physical Anesthesia Plan  ASA: III and emergent  Anesthesia Plan: General   Post-op Pain Management:    Induction: Intravenous, Cricoid pressure planned and Rapid sequence  PONV Risk Score and Plan: Ondansetron and Metaclopromide  Airway Management Planned: Oral ETT  Additional Equipment:   Intra-op Plan:   Post-operative Plan: Extubation in OR  Informed Consent: I have reviewed the patients History and Physical, chart, labs and discussed the procedure including the risks, benefits and alternatives for the proposed anesthesia with the patient or authorized representative who has indicated his/her understanding and acceptance.     Dental advisory given  Plan Discussed with: CRNA and Anesthesiologist  Anesthesia Plan Comments:         Anesthesia Quick Evaluation

## 2020-11-30 NOTE — ED Notes (Signed)
Pt has large edematous area of left buttocks

## 2020-11-30 NOTE — H&P (Signed)
Sean Schaefer 25-Jan-1966  [id number].    Chief Complaint/Reason for Consult: perirectal pain  HPI:  Sean Schaefer is a 55 yo male who presented to urgent clinic this afternoon with several days of worsening perirectal pain. He has had minimal purulent drainage. He says he has felt the area enlarging and feels like he is sitting on a softball. He has had multiple prior perianal abscesses and fistulae, and says they often drain spontaneously. He has tried warm baths with no relief, and his pain has gotten progressively worse. In clinic today he was tachycardic to the 140s but afebrile. I&D was attempted in clinic but was limited due to patient discomfort. On arrival to the ED he was febrile to 39.1.  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 He normally has about 5 BMs daily. He says he was told several years ago that he may actually have Crohn's disease. He has previously had setons placed for perianal fistulae at [facility]. He says he used to follow with GI at [facility] but has not seen them for several years and is not currently on any medications for IBD.   ROS: Review of Systems  Constitutional: Positive for fever. Negative for chills.  Respiratory: Negative for shortness of breath and wheezing.   Cardiovascular: Negative for chest pain.  Gastrointestinal: Negative for blood in stool, nausea and vomiting.  Neurological: Negative for speech change and weakness.    Family History  Problem Relation Age of Onset  . Hypertension Mother   . Cancer Mother   . Hypertension Father   . Diabetes Paternal Grandmother   . Diabetes Paternal Uncle   . Diabetes Paternal Grandfather   . Heart disease Neg Hx     Past Medical History:  Diagnosis Date  . Diabetes mellitus without complication ([place])   . Hypertension   . Ulcerative colitis  [facility])     Past Surgical History:  Procedure Laterality Date  . COLON SURGERY    . Perianal fistula repair  2012    Social History:  reports that he has never smoked. He has never used smokeless tobacco. He reports that he does not drink alcohol and does not use drugs.  Allergies: No Known Allergies  (Not in a hospital admission)    Physical Exam: Blood pressure 111/63, pulse (!) 145, temperature (!) 102.3 F (39.1 C), temperature source Oral, resp. rate (!) 22, SpO2 98 %. General: appears uncomfortable, appears stated age Neurological: alert and oriented, no focal deficits, cranial nerves grossly in tact HEENT: normocephalic, atraumatic, oropharynx clear, no scleral icterus CV: tachycardic, extremities warm and well-perfused Respiratory: normal work of breathing, symmetric chest wall expansion Anorectal: significant perianal induration and erythema, with multiple areas of fluctuance and exquisite tenderness to palpation. Multiple scars noted on the left buttock. Extremities: warm and well-perfused, no deformities, moving all extremities spontaneously Psychiatric: normal mood and affect Skin: warm and dry, no jaundice, no rashes or lesions    Assessment/Plan 55 yo male presenting with a large perirectal abscess with cellulitis and signs of sepsis. 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 History of perianal fistulae is concerning for underlying Crohn's disease. - Begin IV antibiotics - IV fluid hydration - Take to OR for exam under anesthesia with incision and  drainage of perirectal abscess - NPO for OR  - Labs and COVID test pending - Pain control - Patient is to be admitted for procedure and IV antibiotics. At discharge will need follow up with both a colorectal surgeon and gastroenterologist for further workup of possible Crohn's disease.  Michaelle Birks, MD [facility] Surgery General,  Hepatobiliary and Pancreatic Surgery 11/30/20 5:14 PM

## 2020-11-30 NOTE — Anesthesia Postprocedure Evaluation (Signed)
Anesthesia Post Note  Patient: Sean Schaefer  Procedure(s) Performed: IRRIGATION AND DEBRIDEMENT PERIRECTAL ABSCESS (N/A Perineum)     Patient location during evaluation: PACU Anesthesia Type: General Level of consciousness: awake and alert Pain management: pain level controlled Vital Signs Assessment: post-procedure vital signs reviewed and stable Respiratory status: spontaneous breathing, nonlabored ventilation, respiratory function stable and patient connected to nasal cannula oxygen Cardiovascular status: blood pressure returned to baseline and stable Postop Assessment: no apparent nausea or vomiting Anesthetic complications: no   No complications documented.  Last Vitals:  Vitals:   11/30/20 2115 11/30/20 2139  BP:  131/80  Pulse:  93  Resp:  18  Temp: 36.4 C 37.1 C  SpO2:  96%    Last Pain:  Vitals:   11/30/20 2139  TempSrc: Oral  PainSc:                  Litzi Binning COKER

## 2020-11-30 NOTE — ED Triage Notes (Signed)
Pt was seen at surgical center today for left buttock abscess. Pt states they were not able to go deep enough and sent him here for surgical debridement and antibiotics. Pt c/o pain in left buttock x 5 days.

## 2020-12-01 ENCOUNTER — Encounter (HOSPITAL_COMMUNITY): Payer: Self-pay | Admitting: Surgery

## 2020-12-01 DIAGNOSIS — E119 Type 2 diabetes mellitus without complications: Secondary | ICD-10-CM | POA: Diagnosis present

## 2020-12-01 DIAGNOSIS — Z833 Family history of diabetes mellitus: Secondary | ICD-10-CM | POA: Diagnosis not present

## 2020-12-01 DIAGNOSIS — L0231 Cutaneous abscess of buttock: Secondary | ICD-10-CM | POA: Diagnosis present

## 2020-12-01 DIAGNOSIS — Z8249 Family history of ischemic heart disease and other diseases of the circulatory system: Secondary | ICD-10-CM | POA: Diagnosis not present

## 2020-12-01 DIAGNOSIS — I10 Essential (primary) hypertension: Secondary | ICD-10-CM | POA: Diagnosis present

## 2020-12-01 DIAGNOSIS — L03317 Cellulitis of buttock: Secondary | ICD-10-CM | POA: Diagnosis present

## 2020-12-01 DIAGNOSIS — Z20822 Contact with and (suspected) exposure to covid-19: Secondary | ICD-10-CM | POA: Diagnosis present

## 2020-12-01 LAB — BASIC METABOLIC PANEL
Anion gap: 10 (ref 5–15)
BUN: 16 mg/dL (ref 6–20)
CO2: 21 mmol/L — ABNORMAL LOW (ref 22–32)
Calcium: 8.4 mg/dL — ABNORMAL LOW (ref 8.9–10.3)
Chloride: 100 mmol/L (ref 98–111)
Creatinine, Ser: 1.17 mg/dL (ref 0.61–1.24)
GFR, Estimated: >60 mL/min (ref >60)
Glucose, Bld: 281 mg/dL — ABNORMAL HIGH (ref 70–99)
Potassium: 4.3 mmol/L (ref 3.5–5.1)
Sodium: 131 mmol/L — ABNORMAL LOW (ref 135–145)

## 2020-12-01 LAB — CBC
HCT: 41.8 % (ref 39.0–52.0)
Hemoglobin: 14 g/dL (ref 13.0–17.0)
MCH: 29.2 pg (ref 26.0–34.0)
MCHC: 33.5 g/dL (ref 30.0–36.0)
MCV: 87.1 fL (ref 80.0–100.0)
Platelets: 197 10*3/uL (ref 150–400)
RBC: 4.8 MIL/uL (ref 4.22–5.81)
RDW: 11.6 % (ref 11.5–15.5)
WBC: 16.2 10*3/uL — ABNORMAL HIGH (ref 4.0–10.5)
nRBC: 0 % (ref 0.0–0.2)

## 2020-12-01 LAB — GLUCOSE, CAPILLARY
Glucose-Capillary: 224 mg/dL — ABNORMAL HIGH (ref 70–99)
Glucose-Capillary: 238 mg/dL — ABNORMAL HIGH (ref 70–99)
Glucose-Capillary: 239 mg/dL — ABNORMAL HIGH (ref 70–99)
Glucose-Capillary: 264 mg/dL — ABNORMAL HIGH (ref 70–99)
Glucose-Capillary: 286 mg/dL — ABNORMAL HIGH (ref 70–99)
Glucose-Capillary: 298 mg/dL — ABNORMAL HIGH (ref 70–99)

## 2020-12-01 LAB — HEMOGLOBIN A1C
Hgb A1c MFr Bld: 10.7 % — ABNORMAL HIGH (ref 4.8–5.6)
Mean Plasma Glucose: 260.39 mg/dL

## 2020-12-01 MED ORDER — OXYCODONE HCL 5 MG PO TABS
5.0000 mg | ORAL_TABLET | ORAL | Status: DC | PRN
  Administered 2020-12-01 – 2020-12-02 (×4): 10 mg via ORAL
  Filled 2020-12-01 (×4): qty 2

## 2020-12-01 MED ORDER — HYDROMORPHONE HCL 1 MG/ML IJ SOLN: 0.5000 mg | INTRAMUSCULAR | Status: DC | PRN

## 2020-12-01 NOTE — Progress Notes (Addendum)
[facility] Progress Note  1 Day Post-Op  Subjective: CC-  Feeling better than prior to surgery. States that he has had a lot of drainage from surgical wound this morning. WBC 16.2 from 17. Afebrile since prior to surgery.  Objective: Vital signs in last 24 hours: Temp:  [97.5 F (36.4 C)-102.3 F (39.1 C)] 97.5 F (36.4 C) (05/13 0407) Pulse Rate:  [74-145] 74 (05/13 0407) Resp:  [13-22] 18 (05/13 0407) BP: (111-140)/(63-94) 140/94 (05/13 0407) SpO2:  [95 %-99 %] 99 % (05/13 0407) FiO2 (%):  [21 %] 21 % (05/12 2056) Last BM Date: 11/30/20  Intake/Output from previous day: 05/12 0701 - 05/13 0700 In: 2267.6 [I.V.:1194.6; IV Piggyback:1073] Out: -  Intake/Output this shift: No intake/output data recorded.  PE: Gen:  Alert, NAD, pleasant Pulm: rate and effort normal Abd: Soft, NT/ND GU: left gluteal abscess s/p I&D with penrose drain in place and purulent drainage, some induration in the area but no fluctuance or significant cellulitis  Lab Results:  Recent Labs    11/30/20 1618 12/01/20 0136  WBC 17.0* 16.2*  HGB 15.9 14.0  HCT 47.2 41.8  PLT 255 197   BMET Recent Labs    11/30/20 1618 12/01/20 0136  NA 128* 131*  K 4.2 4.3  CL 94* 100  CO2 22 21*  GLUCOSE 309* 281*  BUN 14 16  CREATININE 1.47* 1.17  CALCIUM 8.8* 8.4*   PT/INR Recent Labs    11/30/20 1618  LABPROT 13.9  INR 1.1   CMP     Component Value Date/Time   NA 131 (L) 12/01/2020 0136   NA 139 08/24/2018 1750   K 4.3 12/01/2020 0136   CL 100 12/01/2020 0136   CO2 21 (L) 12/01/2020 0136   GLUCOSE 281 (H) 12/01/2020 0136   BUN 16 12/01/2020 0136   BUN 14 08/24/2018 1750   CREATININE 1.17 12/01/2020 0136   CREATININE 1.25 01/08/2016 1611   CALCIUM 8.4 (L) 12/01/2020 0136   PROT 7.9 12/10/2019 1017   PROT 7.6 12/17/2017 1018   ALBUMIN 3.7 12/10/2019 1017   ALBUMIN 3.8 12/17/2017 1018   AST 16 12/10/2019 1017   ALT 13 12/10/2019 1017   ALKPHOS 51 12/10/2019 1017    BILITOT 0.7 12/10/2019 1017   BILITOT 0.8 12/17/2017 1018   GFRNONAA >60 12/01/2020 0136   GFRNONAA 68 01/08/2016 1611   GFRAA 105 08/24/2018 1750   GFRAA 79 01/08/2016 1611   Lipase  No results found for: LIPASE     Studies/Results: No results found.  Anti-infectives: Anti-infectives (From admission, onward)   Start     Dose/Rate Route Frequency Ordered Stop   12/01/20 0000  piperacillin-tazobactam (ZOSYN) IVPB 3.375 g        3.375 g 12.5 mL/hr over 240 Minutes Intravenous Every 8 hours 11/30/20 1636     11/30/20 1700  piperacillin-tazobactam (ZOSYN) IVPB 3.375 g        3.375 g 100 mL/hr over 30 Minutes Intravenous  Once 11/30/20 1645         Assessment/Plan DM - A1c 10.7. will ask diabetes coordinator to see HTN IBD, ?UC vs Crohns - previously seen by GI at [facility] but has not seen anyone in years Hx prior perianal abscesses and fistulae  Left gluteal abscess S/p Rectal exam under anesthesia, incision and drainage of left gluteal abscess 5/12 Dr. Zenia Resides - POD#1 - culture pending - Keep penrose drain in place. Wounds do not need to be packed. Shower with wound open, sitz  baths. Continue IV zosyn today and follow culture. If he remains afebrile, WBC trending down and patient improving he may be able to go home tomorrow on oral antibiotics. Follow up is on AVS. GI referral being placed by our office.  ID - zosyn 5/12>> FEN - decrease IVF, CM diet VTE - SCDs, lovenox Foley - none Follow up - [facility] clinic, colorectal surgeon, GI   LOS: 0 days    Wellington Hampshire, [facility] 12/01/2020, 9:12 AM Please see Amion for pager number during day hours 7:00am-4:30pm

## 2020-12-01 NOTE — Progress Notes (Signed)
Changed dressing around pt primrose drain. Old dressing was moderately soaked with brownish green thin discharge.  New dressing--1 piece 4x4 gauze folded in half and tucked between gluteal cheeks, 1 piece 4x4 gauze partially tucked between gluteal cheeks and partially covering the area; 1 ABD pad with tape to secure it. Cover with mesh panties.  Pt did not need any PRN medications the second half of the shift.  IV in right hand infiltrated when afternoon dose of Zosyn was less than halfway completed. New IV established and Zosyn times adjusted.

## 2020-12-01 NOTE — Discharge Instructions (Signed)
How to Take a Sitz Bath A sitz bath is a warm water bath that may be used to care for your rectum, genital area, or the area between your rectum and genitals (perineum). In a sitz bath, the water only comes up to your hips and covers your buttocks. A sitz bath may be done in a bathtub or with a portable sitz bath that fits over the toilet. Your health care provider may recommend a sitz bath to help:  Relieve pain and discomfort after delivering a baby.  Relieve pain and itching from hemorrhoids or anal fissures.  Relieve pain after certain surgeries.  Relax muscles that are sore or tight. How to take a sitz bath Take 3-4 sitz baths a day, or as many as told by your health care provider. Bathtub sitz bath To take a sitz bath in a bathtub: 1. Partially fill a bathtub with warm water. The water should be deep enough to cover your hips and buttocks when you are sitting in the tub. 2. Follow your health care provider's instructions if you are told to put medicine in the water. 3. Sit in the water. Open the tub drain a little, and leave it open during your bath. 4. Turn on the warm water again, enough to replace the water that is draining out. Keep the water running throughout your bath. This helps keep the water at the right level and temperature. 5. Soak in the water for 15-20 minutes, or as long as told by your health care provider. 6. When you are done, be careful when you stand up. You may feel dizzy. 7. After the sitz bath, pat yourself dry. Do not rub your skin to dry it.   Over-the-toilet sitz bath To take a sitz bath with an over-the-toilet basin: 1. Follow the manufacturer's instructions. 2. Fill the basin with warm water. 3. Follow your health care provider's instructions if you were told to put medicine in the water. 4. Sit on the seat. Make sure the water covers your buttocks and perineum. 5. Soak in the water for 15-20 minutes, or as long as told by your health care  provider. 6. After the sitz bath, pat yourself dry. Do not rub your skin to dry it. 7. Clean and dry the basin between uses. 8. Discard the basin if it cracks, or according to the manufacturer's instructions.   Contact a health care provider if:  Your pain or itching gets worse. Do not continue with sitz baths if your symptoms get worse.  You have new symptoms. Do not continue with sitz baths until you talk with your health care provider. Summary  A sitz bath is a warm water bath in which the water only comes up to your hips and covers your buttocks.  A sitz bath may help relieve pain and discomfort after delivering a baby. It also may help with pain and itching from hemorrhoids or anal fissures, or pain after certain surgeries. It can also help to relax muscles that are sore or tight.  Take 3-4 sitz baths a day, or as many as told by your health care provider. Soak in the water for 15-20 minutes.  Do not continue with sitz baths if your symptoms get worse. This information is not intended to replace advice given to you by your health care provider. Make sure you discuss any questions you have with your health care provider. Document Revised: 03/23/2020 Document Reviewed: 03/23/2020 Elsevier Patient Education  2021 Elsevier Inc.  

## 2020-12-01 NOTE — Progress Notes (Signed)
Spoke with patient about his HgbA1C of 10.7. states that he was diagnosed with diabetes at least 5 years ago. He takes [place] 38 untis daily, and Humalog 8 unit TID. He has had a Freestyle Libre CGM, but had to send back to company to get some new ones since they were not working properly. He does take Farxiga 10 mg daily along with the other medications.  States that his hgba1C has been greater than 11%, so the 10.7% is better that it has been. States that he has been off his regimen of medications the way that he should be following. States that he plans to get back on board with the correct timing.   Harvel Ricks RN BSN CDE Diabetes Coordinator Pager: [phone]  8am-5pm

## 2020-12-02 ENCOUNTER — Other Ambulatory Visit (HOSPITAL_COMMUNITY): Payer: Self-pay

## 2020-12-02 LAB — BASIC METABOLIC PANEL
Anion gap: 7 (ref 5–15)
BUN: 20 mg/dL (ref 6–20)
CO2: 26 mmol/L (ref 22–32)
Calcium: 8.2 mg/dL — ABNORMAL LOW (ref 8.9–10.3)
Chloride: 98 mmol/L (ref 98–111)
Creatinine, Ser: 0.95 mg/dL (ref 0.61–1.24)
GFR, Estimated: >60 mL/min (ref >60)
Glucose, Bld: 221 mg/dL — ABNORMAL HIGH (ref 70–99)
Potassium: 3.9 mmol/L (ref 3.5–5.1)
Sodium: 131 mmol/L — ABNORMAL LOW (ref 135–145)

## 2020-12-02 LAB — CBC
HCT: 37.9 % — ABNORMAL LOW (ref 39.0–52.0)
Hemoglobin: 12.8 g/dL — ABNORMAL LOW (ref 13.0–17.0)
MCH: 28.8 pg (ref 26.0–34.0)
MCHC: 33.8 g/dL (ref 30.0–36.0)
MCV: 85.4 fL (ref 80.0–100.0)
Platelets: 227 10*3/uL (ref 150–400)
RBC: 4.44 MIL/uL (ref 4.22–5.81)
RDW: 11.4 % — ABNORMAL LOW (ref 11.5–15.5)
WBC: 14.3 10*3/uL — ABNORMAL HIGH (ref 4.0–10.5)
nRBC: 0 % (ref 0.0–0.2)

## 2020-12-02 LAB — GLUCOSE, CAPILLARY
Glucose-Capillary: 144 mg/dL — ABNORMAL HIGH (ref 70–99)
Glucose-Capillary: 191 mg/dL — ABNORMAL HIGH (ref 70–99)
Glucose-Capillary: 205 mg/dL — ABNORMAL HIGH (ref 70–99)
Glucose-Capillary: 213 mg/dL — ABNORMAL HIGH (ref 70–99)

## 2020-12-02 MED ORDER — AMOXICILLIN-POT CLAVULANATE 875-125 MG PO TABS
1.0000 | ORAL_TABLET | Freq: Two times a day (BID) | ORAL | 0 refills | Status: DC
  Filled 2020-12-02: qty 28, 14d supply, fill #0

## 2020-12-02 MED ORDER — HYDROCODONE-ACETAMINOPHEN 5-325 MG PO TABS
1.0000 | ORAL_TABLET | Freq: Four times a day (QID) | ORAL | 0 refills | Status: DC | PRN
  Filled 2020-12-02: qty 10, 2d supply, fill #0

## 2020-12-02 NOTE — Progress Notes (Signed)
Patient discharged to home with instructions and some dressing supplies.

## 2020-12-02 NOTE — Progress Notes (Signed)
2 Days Post-Op   Subjective/Chief Complaint: No complaints   Objective: Vital signs in last 24 hours: Temp:  [97.4 F (36.3 C)-97.7 F (36.5 C)] 97.4 F (36.3 C) (05/14 0426) Pulse Rate:  [67-86] 67 (05/14 0426) Resp:  [16-18] 16 (05/14 0426) BP: (142-149)/(84-94) 147/85 (05/14 0426) SpO2:  [97 %-99 %] 99 % (05/14 0426) Last BM Date: 12/01/20  Intake/Output from previous day: 05/13 0701 - 05/14 0700 In: 800 [P.O.:800] Out: -  Intake/Output this shift: No intake/output data recorded.  Left gluteal abscess s/p drainage with penrose in place, some induration, no erythema, no real drainage now  Lab Results:  Recent Labs    12/01/20 0136 12/02/20 0059  WBC 16.2* 14.3*  HGB 14.0 12.8*  HCT 41.8 37.9*  PLT 197 227   BMET Recent Labs    12/01/20 0136 12/02/20 0059  NA 131* 131*  K 4.3 3.9  CL 100 98  CO2 21* 26  GLUCOSE 281* 221*  BUN 16 20  CREATININE 1.17 0.95  CALCIUM 8.4* 8.2*   PT/INR Recent Labs    11/30/20 1618  LABPROT 13.9  INR 1.1   ABG No results for input(s): PHART, HCO3 in the last 72 hours.  Invalid input(s): PCO2, PO2  Studies/Results: No results found.  Anti-infectives: Anti-infectives (From admission, onward)   Start     Dose/Rate Route Frequency Ordered Stop   12/02/20 0000  amoxicillin-clavulanate (AUGMENTIN) 875-125 MG tablet        1 tablet Oral 2 times daily 12/02/20 0925 12/12/20 2359   12/01/20 0000  piperacillin-tazobactam (ZOSYN) IVPB 3.375 g        3.375 g 12.5 mL/hr over 240 Minutes Intravenous Every 8 hours 11/30/20 1636     11/30/20 1700  piperacillin-tazobactam (ZOSYN) IVPB 3.375 g  Status:  Discontinued        3.375 g 100 mL/hr over 30 Minutes Intravenous  Once 11/30/20 1645 12/01/20 1117      Assessment/Plan: DM - A1c 10.7. HTN IBD, ?UC vs Crohns - previously seen by GI at [facility] but has not seen anyone in years Hx prior perianal abscesses and fistulae  Left gluteal abscess S/p Rectal exam under anesthesia,  incision and drainage of left gluteal abscess 5/12 Dr. Zenia Resides - POD#2 - culture pending - Keep penrose drain in place. Wounds do not need to be packed. Shower with wound open, sitz baths. Home today ID - zosyn 5/12>> FEN -  CM diet VTE - SCDs, lovenox Foley - none Follow up - [facility] clinic, colorectal surgeon, GI  Rolm Bookbinder 12/02/2020

## 2020-12-04 ENCOUNTER — Other Ambulatory Visit: Payer: Self-pay | Admitting: *Deleted

## 2020-12-04 LAB — AEROBIC/ANAEROBIC CULTURE W GRAM STAIN (SURGICAL/DEEP WOUND)

## 2020-12-04 NOTE — Patient Outreach (Signed)
[facility]) Care Management  12/04/2020  Sean Schaefer [birth year] [id number]  Transition of care telephone call  Referral received:12/04/20 Initial outreach:12/04/20 Insurance: [place]  Initial unsuccessful telephone call to patient's preferred number in order to complete transition of care assessment; no answer, left HIPAA compliant voicemail message requesting return call.   Objective: .[name]  was hospitalized at [facility] 5/13-5/14 for Perirectal abscess s/p drainage with penrose drain in place  Comorbidities include: Hypertension , Diabetes A1c 10.7 % on 12/01/20. He was discharged to home on 12/02/20 without the need for home health services or DME.    Plan: This RNCM will route unsuccessful outreach letter with [place] Management pamphlet and 24 hour Nurse Advice Line Magn[place] to [place] Management clinical pool to be mailed to patient's home address. This RNCM will attempt another outreach within 4 business days.  Joylene Draft, RN, BSN  [place] Management Coordinator  [phone]- Mobile [phone]- Toll Free Main Office

## 2020-12-05 ENCOUNTER — Other Ambulatory Visit (HOSPITAL_COMMUNITY): Payer: Self-pay

## 2020-12-05 LAB — CULTURE, BLOOD (ROUTINE X 2)
Culture: NO GROWTH
Special Requests: ADEQUATE

## 2020-12-05 MED ORDER — FARXIGA 10 MG PO TABS
ORAL_TABLET | ORAL | 3 refills | Status: DC
  Filled 2020-12-05 – 2020-12-25 (×2): qty 90, 90d supply, fill #0

## 2020-12-06 LAB — CULTURE, BLOOD (ROUTINE X 2)
Culture: NO GROWTH
Special Requests: ADEQUATE

## 2020-12-07 ENCOUNTER — Other Ambulatory Visit: Payer: Self-pay | Admitting: *Deleted

## 2020-12-07 NOTE — Patient Outreach (Signed)
[place] [facility]) Care Management  12/07/2020  PATSY VARMA [birth year] [id number]    Transition of care call  Referral received: 12/04/20 Initial outreach attempt: 12/04/20 Insurance: [place]    2nd unsuccessful telephone call to patient's preferred contact number in order to complete post hospital discharge transition of care assessment , no answer left HIPAA compliant message requesting return call.    Objective: Per electronic medical record, Mr. Tarez Bowns was hospitalized [facility] 5/13-5/14 for Perirectal abscess s/p drainage with penrose drain in place  Comorbidities include: Hypertension , Diabetes A1c 10.7 % on 12/01/20. He was discharged to home on 12/02/20 without the need for home health servicesor DME.  Plan If no return call from patient will attempt 3rd outreach in the next 4 business days.    Joylene Draft, RN, BSN  [place] Management Coordinator  [phone]- Mobile [phone]- Toll Free Main Office

## 2020-12-13 ENCOUNTER — Other Ambulatory Visit (HOSPITAL_COMMUNITY): Payer: Self-pay

## 2020-12-13 ENCOUNTER — Other Ambulatory Visit: Payer: Self-pay | Admitting: *Deleted

## 2020-12-13 NOTE — Patient Outreach (Signed)
[place] [facility]) Care Management  12/13/2020  Sean Schaefer [birth year] [id number]   Transition of care call Referral received: 12/04/20 Initial outreach attempt: 12/04/20 Insurance: [place] unsuccessful telephone call to patient's preferred contact number in order to complete post hospital discharge transition of care assessment; no answer, left HIPAA compliant message requesting return call.   Objective: [facility] electronic [facility] s/p drainage with penrose drain in placeComorbidities include: Hypertension , Diabetes A1c 10.7% on 12/01/20. He was discharged to home on5/14/22without the need for home health servicesor DME.  Plan: If no return call from patient, will plan return call in the next 3 weeks.    Joylene Draft, RN, BSN  [place] Management Coordinator  [phone]- Mobile [phone]- Toll Free Main Office

## 2020-12-20 ENCOUNTER — Encounter (HOSPITAL_BASED_OUTPATIENT_CLINIC_OR_DEPARTMENT_OTHER): Payer: No Typology Code available for payment source | Admitting: Internal Medicine

## 2020-12-21 NOTE — Discharge Summary (Signed)
Physician Discharge Summary  Patient ID: Sean Schaefer MRN: [id number] DOB/AGE: 1966/02/28 55 y.o.  Admit date: 11/30/2020 Discharge date: 12/21/2020  Admission Diagnoses: Perirectal abscess  Discharge Diagnoses:  Gluteal abscess/cellulitis  Discharged Condition: stable  Hospital Course: Sean Schaefer is a 55 yo male with a history of multiple prior perianal abscesses and fistulae who presented to clinic with worsening perirectal pain. On exam he had significant perianal and gluteal induration with some fluctuance. He was sent to the ED and admitted, and started on IV antibiotics. He was febrile and tachycardic on admission. That evening he underwent exam under anesthesia with incision and drainage of a gluteal abscess and placement of a penrose drain. On POD1 his WBC started to downtrend and he was afebrile. On POD2 he remained afebrile and cellulitis was improving. He was examined and deemed appropriate for discharge home on oral antibiotics. He will have outpatient follow up with GI and colorectal surgery.  Consults: None  Significant Diagnostic Studies: None  Treatments: antibiotics: Zosyn and surgery: Rectal exam under anesthesia, incision and drainage of left gluteal abscess  Discharge Exam: Blood pressure (!) 149/84, pulse 70, temperature 97.7 F (36.5 C), temperature source Oral, resp. rate 17, SpO2 98 %. General appearance: alert, cooperative and appears stated age Head: Normocephalic, without obvious abnormality, atraumatic Neck: trachea midline GI: Penrose in place left gluteus with minimal drainage, mild induration but no erythema Extremities: extremities normal, atraumatic, no cyanosis or edema  Disposition: Discharge disposition: 01-Home or Self Care        Allergies as of 12/02/2020   No Known Allergies     Medication List    TAKE these medications   amoxicillin-clavulanate 875-125 MG tablet Commonly known as: Augmentin Take 1 tablet by mouth 2 (two) times  daily.   atorvastatin 10 MG tablet Commonly known as: LIPITOR Take 1 tablet (10 mg total) by mouth daily.   blood glucose meter kit and supplies Dispense based on patient and insurance preference. Use up to four times daily as directed. (FOR ICD-10 E10.9, E11.9).   Farxiga 10 MG Tabs tablet Generic drug: dapagliflozin propanediol TAKE 1 TABLET BY MOUTH ONCE A DAY   FreeStyle Libre 14 Day Sensor Misc USE AS DIRECTED EVERY 14 DAYS   FreeStyle Precision Neo Test test strip Generic drug: glucose blood 1 each by Other route daily. Use Freestyle Precision Neo test strips as instructed to check blood sugar once daily.   HumaLOG KwikPen 100 UNIT/ML KwikPen Generic drug: insulin lispro INJECT 8 UNITS BEFORE EACH MEAL OR AS DIRECTED   HYDROcodone-acetaminophen 5-325 MG tablet Commonly known as: NORCO/VICODIN Take 1-2 tablets by mouth every 6 (six) hours as needed for moderate pain. What changed:   how much to take  how to take this  when to take this  reasons to take this   metFORMIN 500 MG tablet Commonly known as: GLUCOPHAGE Take 500 mg by mouth 2 (two) times daily. Take 1 tablet by mouth in the morning and 1 tablet in the evening.   Tyler Aas FlexTouch 100 UNIT/ML FlexTouch Pen Generic drug: insulin degludec INJECT 38 UNITS UNDER THE SKIN ONCE A DAY What changed: See the new instructions.       Follow-up [facility] [place]. Go on 12/19/2020.   Specialty: General Surgery Why: Your appointment is 5/31 at 11am Please arrive 15 minutes early to check in. Bring photo ID and insurance information. Contact information: [address] [place] [phone]  Ileana Roup, MD. Daphane Shepherd on 01/02/2021.   Specialties: General Surgery, Colon and Rectal Surgery Why: Your appointment is 6/14 at 10:30am Please arrive 15 minutes early to check in. Contact information: [place]  [address] [phone]        Gastroenterology, [place] [place]. Call.   Why: Their office should contract you with an appointment Contact information: [place] [address] [phone]               Signed: Dwan Bolt 12/21/2020, 9:16 AM

## 2020-12-25 ENCOUNTER — Other Ambulatory Visit (HOSPITAL_COMMUNITY): Payer: Self-pay

## 2020-12-25 ENCOUNTER — Other Ambulatory Visit: Payer: Self-pay | Admitting: Endocrinology

## 2020-12-26 ENCOUNTER — Other Ambulatory Visit (HOSPITAL_COMMUNITY): Payer: Self-pay

## 2020-12-28 ENCOUNTER — Other Ambulatory Visit (HOSPITAL_COMMUNITY): Payer: Self-pay

## 2020-12-29 ENCOUNTER — Other Ambulatory Visit (HOSPITAL_COMMUNITY): Payer: Self-pay

## 2021-01-02 ENCOUNTER — Other Ambulatory Visit (HOSPITAL_COMMUNITY): Payer: Self-pay

## 2021-01-02 MED ORDER — METRONIDAZOLE 500 MG PO TABS
1000.0000 mg | ORAL_TABLET | Freq: Three times a day (TID) | ORAL | 0 refills | Status: DC
  Filled 2021-01-02 – 2021-02-01 (×2): qty 6, 1d supply, fill #0

## 2021-01-02 MED ORDER — NEOMYCIN SULFATE 500 MG PO TABS
1000.0000 mg | ORAL_TABLET | ORAL | 0 refills | Status: DC
  Filled 2021-01-02 – 2021-02-02 (×3): qty 6, 1d supply, fill #0

## 2021-01-04 ENCOUNTER — Other Ambulatory Visit: Payer: Self-pay | Admitting: *Deleted

## 2021-01-04 NOTE — Patient Outreach (Signed)
[facility]) Care Management  01/04/2021  [name] [birth year] [id number]   Transition of care /Case Closure Unsuccessful outreach    Referral received : 12/04/20 Initial outreach:12/04/20 Insurance: [employer]    Unable to complete post hospital discharge transition of care assessment. No return call form patient after 3 call attempts and no response to request to contact RN Care Coordinator in unsuccessful outreach letter mailed to home on 12/04/20.  Objective: Per electronic medical record, Mr. Makoa Satz  was hospitalized at [facility] 5/13-5/14 for Perirectal abscess s/p drainage with penrose drain in place  Comorbidities include: Hypertension , Diabetes A1c 10.7 % on 12/01/20. He was discharged to home on 12/02/20 without the need for home health services or DME. Plan Case closed to [place] care management services unsuccessful outreach calls x 4.Noted per [employer] ,Active Advice view patient is enrolled in chronic condition management program with last visit with [facility] on 12/15/20.   Joylene Draft, RN, BSN  [place] Management Coordinator  [phone]- Mobile [phone]- Toll Free Main Office

## 2021-01-11 ENCOUNTER — Other Ambulatory Visit (HOSPITAL_COMMUNITY): Payer: Self-pay

## 2021-01-19 NOTE — Patient Instructions (Addendum)
DUE TO COVID-19 ONLY ONE VISITOR IS ALLOWED TO COME WITH YOU AND STAY IN THE WAITING ROOM ONLY DURING PRE OP AND PROCEDURE DAY OF SURGERY. THE 1 VISITOR  MAY VISIT WITH YOU AFTER SURGERY IN YOUR PRIVATE ROOM DURING VISITING HOURS ONLY!                Sean Schaefer    Your procedure is scheduled on: 02/05/21   Report to [facility] Main  Entrance   Report to admitting at  7:00 AM     Call this number if you have problems the morning of surgery [phone]    Remember: Do not eat food After Midnight.  CLEAR LIQUIDS UNTIL 6:00 AM  FOLLOW ALL BOWEL PREP INSTRUCTIONS FROM DR'S OFFICE DRINK PLENTY OF FLUIDS ON PREP DAY.   BRUSH YOUR TEETH MORNING OF SURGERY AND RINSE YOUR MOUTH OUT, NO CHEWING GUM CANDY OR MINTS.     Take these medicines the morning of surgery with A SIP OF WATER: none   How to Manage Your Diabetes Before and After Surgery  Why is it important to control my blood sugar before and after surgery? Improving blood sugar levels before and after surgery helps healing and can limit problems. A way of improving blood sugar control is eating a healthy diet by:  Eating less sugar and carbohydrates  Increasing activity/exercise  Talking with your doctor about reaching your blood sugar goals High blood sugars (greater than 180 mg/dL) can raise your risk of infections and slow your recovery, so you will need to focus on controlling your diabetes during the weeks before surgery. Make sure that the doctor who takes care of your diabetes knows about your planned surgery including the date and location.  How do I manage my blood sugar before surgery? Check your blood sugar at least 4 times a day, starting 2 days before surgery, to make sure that the level is not too high or low. Check your blood sugar the morning of your surgery when you wake up and every 2 hours until you get to the Short Stay unit. If your blood sugar is less than 70 mg/dL, you will need to treat for low  blood sugar: Do not take insulin. Treat a low blood sugar (less than 70 mg/dL) with  cup of clear juice (cranberry or apple), 4 glucose tablets, OR glucose gel. Recheck blood sugar in 15 minutes after treatment (to make sure it is greater than 70 mg/dL). If your blood sugar is not greater than 70 mg/dL on recheck, call [phone] for further instructions. Report your blood sugar to the short stay nurse when you get to Short Stay.  If you are admitted to the hospital after surgery: Your blood sugar will be checked by the staff and you will probably be given insulin after surgery (instead of oral diabetes medicines) to make sure you have good blood sugar levels. The goal for blood sugar control after surgery is 80-180 mg/dL.   WHAT DO I DO ABOUT MY DIABETES MEDICATION?  Do not take Wilder Glade the day before surgery.  Do not take oral diabetes medicines (pills) the morning of surgery.  THE NIGHT BEFORE  SURGERY, take 17 units of   Tresiba  insulin.   If your CBG is greater than 220 mg/dL, you may take  of your sliding scale  (correction) dose of insulin.( Humalog) no bed time dose.  You may not have any metal on your body including              piercings  Do not wear jewelry,  lotions, powders or deodorant                          Men may shave face and neck.   Do not bring valuables to the hospital. [place].  Contacts, dentures or bridgework may not be worn into surgery.       Patients discharged the day of surgery will not be allowed to drive home.  IF YOU ARE HAVING SURGERY AND GOING HOME THE SAME DAY, YOU MUST HAVE AN ADULT TO DRIVE YOU HOME AND BE WITH YOU FOR 24 HOURS.  YOU MAY GO HOME BY TAXI OR UBER OR ORTHERWISE, BUT AN ADULT MUST ACCOMPANY YOU HOME AND STAY WITH YOU FOR 24 HOURS.  Name and phone number of your driver:  Special Instructions: N/A              Please read over the  following fact sheets you were given: _____________________________________________________________________             [facility] - Preparing for Surgery Before surgery, you can play an important role.  Because skin is not sterile, your skin needs to be as free of germs as possible.  You can reduce the number of germs on your skin by washing with CHG (chlorahexidine gluconate) soap before surgery.  CHG is an antiseptic cleaner which kills germs and bonds with the skin to continue killing germs even after washing. Please DO NOT use if you have an allergy to CHG or antibacterial soaps.  If your skin becomes reddened/irritated stop using the CHG and inform your nurse when you arrive at Short Stay.  You may shave your face/neck.  Please follow these instructions carefully:  1.  Shower with CHG Soap the night before surgery and the  morning of Surgery.  2.  If you choose to wash your hair, wash your hair first as usual with your  normal  shampoo.  3.  After you shampoo, rinse your hair and body thoroughly to remove the  shampoo.                                        4.  Use CHG as you would any other liquid soap.  You can apply chg directly  to the skin and wash                       Gently with a scrungie or clean washcloth.  5.  Apply the CHG Soap to your body ONLY FROM THE NECK DOWN.   Do not use on face/ open                           Wound or open sores. Avoid contact with eyes, ears mouth and genitals (private parts).                       Wash face,  Genitals (private parts) with your normal soap.             6.  Wash thoroughly, paying special attention to the area where your surgery  will be performed.  7.  Thoroughly rinse your body with warm water from the neck down.  8.  DO NOT shower/wash with your normal soap after using and rinsing off  the CHG Soap.             9.  Pat yourself dry with a clean towel.            10.  Wear clean pajamas.            11.  Place clean sheets on your  bed the night of your first shower and do not  sleep with pets. Day of Surgery : Do not apply any lotions/deodorants the morning of surgery.  Please wear clean clothes to the hospital/surgery center.  FAILURE TO FOLLOW THESE INSTRUCTIONS MAY RESULT IN THE CANCELLATION OF YOUR SURGERY PATIENT SIGNATURE_________________________________  NURSE SIGNATURE__________________________________  ________________________________________________________________________

## 2021-01-29 ENCOUNTER — Other Ambulatory Visit (HOSPITAL_COMMUNITY): Payer: Self-pay

## 2021-01-30 ENCOUNTER — Encounter (HOSPITAL_COMMUNITY): Payer: Self-pay

## 2021-01-30 ENCOUNTER — Other Ambulatory Visit: Payer: Self-pay

## 2021-01-30 ENCOUNTER — Encounter (HOSPITAL_COMMUNITY)
Admission: RE | Admit: 2021-01-30 | Discharge: 2021-01-30 | Disposition: A | Discharge status: Home / Self Care | Payer: No Typology Code available for payment source | Source: Ambulatory Visit | Attending: Surgery | Admitting: Surgery

## 2021-01-30 DIAGNOSIS — Z01812 Encounter for preprocedural laboratory examination: Secondary | ICD-10-CM | POA: Diagnosis present

## 2021-01-30 HISTORY — DX: Crohn's disease, unspecified, without complications: K50.90

## 2021-01-30 LAB — CBC
HCT: 44.1 % (ref 39.0–52.0)
Hemoglobin: 14.5 g/dL (ref 13.0–17.0)
MCH: 29.5 pg (ref 26.0–34.0)
MCHC: 32.9 g/dL (ref 30.0–36.0)
MCV: 89.6 fL (ref 80.0–100.0)
Platelets: 166 10*3/uL (ref 150–400)
RBC: 4.92 MIL/uL (ref 4.22–5.81)
RDW: 12.5 % (ref 11.5–15.5)
WBC: 4.8 10*3/uL (ref 4.0–10.5)
nRBC: 0 % (ref 0.0–0.2)

## 2021-01-30 LAB — BASIC METABOLIC PANEL
Anion gap: 8 (ref 5–15)
BUN: 9 mg/dL (ref 6–20)
CO2: 24 mmol/L (ref 22–32)
Calcium: 8.4 mg/dL — ABNORMAL LOW (ref 8.9–10.3)
Chloride: 103 mmol/L (ref 98–111)
Creatinine, Ser: 0.91 mg/dL (ref 0.61–1.24)
GFR, Estimated: >60 mL/min (ref >60)
Glucose, Bld: 394 mg/dL — ABNORMAL HIGH (ref 70–99)
Potassium: 4.2 mmol/L (ref 3.5–5.1)
Sodium: 135 mmol/L (ref 135–145)

## 2021-01-30 LAB — GLUCOSE, CAPILLARY: Glucose-Capillary: 349 mg/dL — ABNORMAL HIGH (ref 70–99)

## 2021-01-30 LAB — HEMOGLOBIN A1C
Hgb A1c MFr Bld: 8.6 % — ABNORMAL HIGH (ref 4.8–5.6)
Mean Plasma Glucose: 200.12 mg/dL

## 2021-01-30 NOTE — Progress Notes (Addendum)
COVID Vaccine Completed:Yes Date COVID Vaccine completed:Pt can't remember dates. He had 3 shots total COVID vaccine manufacturer: Pfizer     PCP - Dr. Alfonso Patten. Polite Pt will see the Dr. Today to get a new Libre free style Cardiologist - none  Chest x-ray - no EKG - 12/01/20-epic Stress Test - no ECHO - no Cardiac Cath - no Pacemaker/ICD device last checked:NA  Sleep Study - no CPAP -   Fasting Blood Sugar - 140-160 Checks Blood Sugar _QD____ times a day Pt not sure if he will have a libre by surgery day.  Blood Thinner Instructions:NA Aspirin Instructions: Last Dose:  Anesthesia review: no  Patient denies shortness of breath, fever, cough and chest pain at PAT appointment Yes Pt works out regularly. No SOB with any activities.  Patient verbalized understanding of instructions that were given to them at the PAT appointment. Patient was also instructed that they will need to review over the PAT instructions again at home before surgery. Yes No orders at PAT. Pre surgical drinks not given pt was told to follow bowel prep instructions and get G2 from the store. A1c was 8.6 at PAT visit . Chart given to [facility] for review.

## 2021-01-31 ENCOUNTER — Ambulatory Visit: Payer: Self-pay | Admitting: Surgery

## 2021-01-31 ENCOUNTER — Other Ambulatory Visit (HOSPITAL_COMMUNITY): Payer: Self-pay

## 2021-01-31 MED ORDER — FREESTYLE LIBRE 14 DAY SENSOR MISC
3 refills | Status: DC
  Filled 2021-01-31: qty 2, 28d supply, fill #0
  Filled 2021-04-06: qty 2, 28d supply, fill #1

## 2021-01-31 MED ORDER — FREESTYLE LIBRE 14 DAY READER DEVI
2 refills | Status: DC
  Filled 2021-01-31: qty 1, 30d supply, fill #0

## 2021-01-31 MED ORDER — INSULIN DEGLUDEC 100 UNIT/ML ~~LOC~~ SOPN
PEN_INJECTOR | SUBCUTANEOUS | 1 refills | Status: DC
  Filled 2021-01-31: qty 30, 75d supply, fill #0

## 2021-01-31 NOTE — H&P (Signed)
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 Benard Rink.  He was subsequently referred to the [facility] in [facility] and under the care of Dr. Abram Sander and creation of an ileal J-pouch.  He states that this all occurred in 1989.  In the years after surgery he has been dealing with recurrent perianal abscesses.  He has had multiple incision and drainage procedures.  He has also had multiple abscesses at home and he has been able to self express.  He was recently seen in our urgent office and underwent repeat incision and drainage.  He was referred to see me.  Currently, he has been doing well.  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  He denies any significant perianal pain or recurrent symptoms of abscess.  He denies any fevers or chills.  He reports his pouch function has been reasonable having 4-6 bowel movements per day with toothpaste consistency.  It does not appear he has had any sort of recent endoscopic evaluation of his pouch.  He does report that he's previously been on Humira but has not been on any sort of therapy for his IBD since 2015.  PMH: HTN, IBD  PSH: As above  FHx: Denies FHx of colorectal, breast, endometrial, ovarian or cervical cancer  Social: Denies use of tobacco/EtOH/drugs. Works multiple jobs including jobs in Chiropractor. He is here today with his wife whom works with [employer] in behavioral health  ROS: A comprehensive 10 system review of systems was completed with the patient and pertinent findings as noted above.  The patient is a 55 year old male.   Allergies [facility] Jeannene Patella, CMA; 01/02/2021 10:41 AM) No Known Drug Allergies   [11/30/2020]:  Medication History Altamese Cabal, CMA; 01/02/2021 10:41 AM) Wilder Glade  (10MG Tablet, Oral) Active. FreeStyle Libre  14 Day Sensor  Active. metFORMIN HCl ER  (500MG Tablet ER 24HR, Oral) Active.    Review of Systems Sean Gave M. Janis Sol Sean Schaefer; 01/02/2021 11:41 AM) General Not Present- Appetite Loss, Chills, Fatigue, Fever, Night Sweats, Weight Gain and Weight Loss. Skin Present- Non-Healing Wounds and Rash. Not Present- Change in Wart/Mole, Dryness, Hives, Jaundice, New Lesions and Ulcer. HEENT Present- Wears glasses/contact lenses. Not Present- Earache, Hearing Loss, Hoarseness, Nose Bleed, Oral Ulcers, Ringing in the Ears, Seasonal Allergies, Sinus Pain, Sore Throat, Visual Disturbances and Yellow Eyes. Respiratory Not Present- Bloody sputum, Chronic Cough, Difficulty Breathing, Snoring and Wheezing. Breast Not Present- Breast Mass, Breast Pain, Nipple Discharge and Skin Changes. Cardiovascular Not Present- Chest Pain, Difficulty Breathing Lying Down, Leg Cramps, Palpitations, Rapid Heart Rate, Shortness of Breath and Swelling of Extremities. Gastrointestinal Present- Hemorrhoids. Not Present- Abdominal Pain, Bloating, Bloody Stool, Change in Bowel Habits, Chronic diarrhea, Constipation, Difficulty Swallowing, Excessive gas, Gets full quickly at meals, Indigestion, Nausea, Rectal Pain and Vomiting. Male Genitourinary Not Present- Blood in Urine, Change in Urinary Stream, Frequency, Impotence, Nocturia, Painful Urination, Urgency and Urine Leakage. Musculoskeletal Not Present- Back Pain, Joint Pain, Joint Stiffness, Muscle Pain, Muscle Weakness and Swelling of Extremities. Neurological Not Present- Decreased Memory, Fainting, Headaches, Numbness, Seizures, Tingling, Tremor, Trouble walking and Weakness. Psychiatric Not Present- Anxiety, Bipolar, Change in Sleep Pattern, Depression, Fearful and Frequent crying. Endocrine Not Present- Cold Intolerance, Excessive Hunger, Hair Changes, Heat Intolerance and New Diabetes. Hematology Present- Persistent Infections. Not Present- Blood Thinners, Easy  Bruising, Excessive  bleeding, Gland problems and HIV.   Physical Exam Sean Gave M. Aragorn Recker Sean Schaefer; 01/02/2021 11:42 AM) The physical exam findings are as follows: Note:  Constitutional: No acute distress; conversant; no deformities; wearing mask Eyes: Moist conjunctiva; no lid lag; anicteric sclerae; pupils equal and round Neck: Trachea midline; no palpable thyromegaly Lungs: Normal respiratory effort; no tactile fremitus CV: rrr; no palpable thrill; no pitting edema GI: Abdomen soft, nontender, nondistended; no palpable hepatosplenomegaly Anorectal: multiple external openings primarily on the left side consistent with perianal fistula. DRE - limited on extent of exam as he has mild intrinsic stenosis. This was therefore terminated. MSK: Normal gait; no clubbing/cyanosis Psychiatric: Appropriate affect; alert and oriented 3 Lymphatic: No palpable cervical or axillary lymphadenopathy **A chaperone, Altamese Cabal, was present for this encounter    Assessment & Plan Sean Gave M. Seniah Lawrence Sean Schaefer; 01/02/2021 11:44 AM) PERIANAL CROHN'S DISEASE (K50.10) Story: Mr. 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 Leafy Kindle, pouch with Dr. Owens Loffler at [facility]; now with almost certainly perianal Crohn's disease with what I suspect to be fistula to his pouch Impression: -He has previously been on Humira but has been off all therapy for 7 years despite having active fistula. He has requested a referral to a gastroenterologist. We will place a referral to [place] for medical management of what I am highly suspicious represents perianal Crohn's disease -we discussed options going forward. Given the frequency with which she is having abscesses, we discussed proceeding with anorectal exam under anesthesia, possible incision and drainage of perianal abscesses of present, interrogation of his fistulous tracts with placement of setons. We also concurrently performed pouchoscopy with biopsies  given the interval since his last endoscopic evaluation.  This patient encounter took 35 minutes today to perform the following: take history, perform exam, review outside records, interpret imaging, counsel the patient on their diagnosis and document encounter, findings & plan in the EHR  Signed by Sean Roup, Sean Schaefer (01/02/2021 11:44 AM)

## 2021-01-31 NOTE — Progress Notes (Addendum)
Anesthesia Chart Review   Case: [id number] Date/Time: 02/05/21 0845   Procedures:      INCISION AND DRAINAGE OF PERIANAL ABSCESS     PLACEMENT OF SETONS     ANORECTAL EXAM UNDER ANESTHESIA     FLEXIBLE SIGMOIDOSCOPY WITH BIOPSY   Anesthesia type: General   Pre-op diagnosis: PERIANAL CROHN'S DISEASE   Location: [place] / [facility] ORS   Surgeons: Ileana Roup, MD       DISCUSSION:55 y.o. never smoker with h/o HTN, DM II, perianal Crohn's disease scheduled for above procedure 02/05/2021 with Dr. Nadeen Landau.   Pt with recent admission 5/12-12/21/20 due to perirectal abscess.  Underwent exam under anesthesia with incision and drainage of a gluteal abscess and placement of a penrose drain. Stable at discharge with PO antibiotics prescribed.   Pt last seen by PCP 10/30/20. Per PCP notes valsartan was started at this time. Discussed with pt who reports he did not start this medication.  Advised to begin medication prior to surgery and risk of same day cancellation if BP is elevated DOS.   During admission A1C 10.7, this has improved since that time.  A1C now 8.6. Glucose 349 at PAT visit, non-fasting.  He reports he checks his blood sugars 4 times per day with typical readings of 140-160.  He is scheduled to get a libre glucose monitor soon. Evaluate glucose DOS, discussed risk of same day cancellation.   Discussed with Dr. Fransisco Beau.  VS: BP (!) 165/82   Pulse (!) 105   Temp 36.9 C (Oral)   Resp 20   Ht _0  (1.778 m)   Wt 100.2 kg   SpO2 97%   BMI 31.71 kg/m   PROVIDERS: [name], MD is PCP    LABS:  forwarded to surgeon (all labs ordered are listed, but only abnormal results are displayed)  Labs Reviewed  GLUCOSE, CAPILLARY - Abnormal; Notable for the following components:      Result Value   Glucose-Capillary 349 (*)    All other components within normal limits  HEMOGLOBIN A1C - Abnormal; Notable for the following components:   Hgb A1c MFr Bld 8.6 (*)     All other components within normal limits  BASIC METABOLIC PANEL - Abnormal; Notable for the following components:   Glucose, Bld 394 (*)    Calcium 8.4 (*)    All other components within normal limits  CBC     IMAGES:   EKG:   CV:  Past Medical History:  Diagnosis Date   Crohn's disease ([place])    Diabetes mellitus without complication ([place])    Hypertension     Past Surgical History:  Procedure Laterality Date   COLON SURGERY     EYE SURGERY Left    INCISION AND DRAINAGE PERIRECTAL ABSCESS N/A 11/30/2020   Procedure: IRRIGATION AND DEBRIDEMENT PERIRECTAL ABSCESS;  Surgeon: Dwan Bolt, MD;  Location: [facility];  Service: General;  Laterality: N/A;   Perianal fistula repair  07/22/2010   TESTICLE REMOVAL Left 2000    MEDICATIONS:  amoxicillin-clavulanate (AUGMENTIN) 875-125 MG tablet   atorvastatin (LIPITOR) 10 MG tablet   blood glucose meter kit and supplies   Continuous Blood Gluc Receiver (FREESTYLE LIBRE 14 DAY READER) DEVI   Continuous Blood Gluc Sensor (FREESTYLE LIBRE 14 DAY SENSOR) MISC   Continuous Blood Gluc Sensor (FREESTYLE LIBRE 14 DAY SENSOR) MISC   dapagliflozin propanediol (FARXIGA) 10 MG TABS tablet   FARXIGA 10 MG TABS tablet   glucose blood (  FREESTYLE PRECISION NEO TEST) test strip   HUMALOG KWIKPEN 100 UNIT/ML KwikPen   HYDROcodone-acetaminophen (NORCO/VICODIN) 5-325 MG tablet   insulin degludec (TRESIBA) 100 UNIT/ML FlexTouch Pen   metroNIDAZOLE (FLAGYL) 500 MG tablet   neomycin (MYCIFRADIN) 500 MG tablet   TRESIBA FLEXTOUCH 100 UNIT/ML FlexTouch Pen    Insulin Pen Needle (NOVOFINE) 10 each    Konrad Felix, PA-C [facility] Pre-Surgical Testing [phone]

## 2021-02-01 ENCOUNTER — Other Ambulatory Visit (HOSPITAL_COMMUNITY): Payer: Self-pay

## 2021-02-02 ENCOUNTER — Other Ambulatory Visit (HOSPITAL_COMMUNITY): Payer: Self-pay

## 2021-02-02 MED ORDER — ROSUVASTATIN CALCIUM 10 MG PO TABS
ORAL_TABLET | ORAL | 3 refills | Status: DC
  Filled 2021-02-02: qty 90, 90d supply, fill #0
  Filled 2021-06-01: qty 90, 90d supply, fill #1

## 2021-02-02 NOTE — Anesthesia Preprocedure Evaluation (Addendum)
Anesthesia Evaluation  Patient identified by MRN, date of birth, ID band Patient awake    Reviewed: Allergy & Precautions, NPO status , Patient's Chart, lab work & pertinent test results  Airway Mallampati: II  TM Distance: >3 FB Neck ROM: Full    Dental no notable dental hx.    Pulmonary neg pulmonary ROS,    Pulmonary exam normal breath sounds clear to auscultation       Cardiovascular hypertension, Normal cardiovascular exam Rhythm:Regular Rate:Normal  ECG: ST   Neuro/Psych negative neurological ROS  negative psych ROS   GI/Hepatic negative GI ROS, Neg liver ROS,   Endo/Other  diabetes, Insulin Dependent  Renal/GU negative Renal ROS     Musculoskeletal negative musculoskeletal ROS (+)   Abdominal (+) + obese,   Peds  Hematology HLD   Anesthesia Other Findings PERIANAL CROHN'S DISEASE  Reproductive/Obstetrics                           Anesthesia Physical Anesthesia Plan  ASA: 2  Anesthesia Plan: General   Post-op Pain Management:    Induction: Intravenous  PONV Risk Score and Plan: 2 and Ondansetron, Dexamethasone, Midazolam and Treatment may vary due to age or medical condition  Airway Management Planned: LMA  Additional Equipment:   Intra-op Plan:   Post-operative Plan: Extubation in OR  Informed Consent: I have reviewed the patients History and Physical, chart, labs and discussed the procedure including the risks, benefits and alternatives for the proposed anesthesia with the patient or authorized representative who has indicated his/her understanding and acceptance.     Dental advisory given  Plan Discussed with: CRNA  Anesthesia Plan Comments: (Reviewed PAT note 01/30/2021, Konrad Felix, PA-C)       Anesthesia Quick Evaluation

## 2021-02-04 MED ORDER — BUPIVACAINE LIPOSOME 1.3 % IJ SUSP
20.0000 mL | Freq: Once | INTRAMUSCULAR | Status: DC
  Filled 2021-02-04: qty 20

## 2021-02-05 ENCOUNTER — Ambulatory Visit (HOSPITAL_COMMUNITY): Payer: No Typology Code available for payment source | Admitting: Anesthesiology

## 2021-02-05 ENCOUNTER — Ambulatory Visit (HOSPITAL_COMMUNITY)
Admission: RE | Admit: 2021-02-05 | Discharge: 2021-02-05 | Disposition: A | Discharge status: Home / Self Care | Payer: No Typology Code available for payment source | Attending: Surgery | Admitting: Surgery

## 2021-02-05 ENCOUNTER — Encounter (HOSPITAL_COMMUNITY): Payer: Self-pay | Admitting: Surgery

## 2021-02-05 ENCOUNTER — Other Ambulatory Visit (HOSPITAL_COMMUNITY): Payer: Self-pay

## 2021-02-05 ENCOUNTER — Encounter (HOSPITAL_COMMUNITY)
Admission: RE | Disposition: A | Discharge status: Home / Self Care | Payer: Self-pay | Source: Home / Self Care | Attending: Surgery

## 2021-02-05 ENCOUNTER — Ambulatory Visit (HOSPITAL_COMMUNITY): Payer: No Typology Code available for payment source | Admitting: Physician Assistant

## 2021-02-05 DIAGNOSIS — K50113 Crohn's disease of large intestine with fistula: Secondary | ICD-10-CM | POA: Insufficient documentation

## 2021-02-05 DIAGNOSIS — K50114 Crohn's disease of large intestine with abscess: Secondary | ICD-10-CM | POA: Insufficient documentation

## 2021-02-05 DIAGNOSIS — I10 Essential (primary) hypertension: Secondary | ICD-10-CM | POA: Diagnosis not present

## 2021-02-05 DIAGNOSIS — Z8249 Family history of ischemic heart disease and other diseases of the circulatory system: Secondary | ICD-10-CM | POA: Diagnosis not present

## 2021-02-05 DIAGNOSIS — K610 Anal abscess: Secondary | ICD-10-CM | POA: Diagnosis present

## 2021-02-05 HISTORY — PX: FLEXIBLE SIGMOIDOSCOPY: SHX5431

## 2021-02-05 HISTORY — PX: PLACEMENT OF SETON: SHX6029

## 2021-02-05 HISTORY — PX: INCISION AND DRAINAGE ABSCESS: SHX5864

## 2021-02-05 HISTORY — PX: RECTAL EXAM UNDER ANESTHESIA: SHX6399

## 2021-02-05 LAB — GLUCOSE, CAPILLARY
Glucose-Capillary: 152 mg/dL — ABNORMAL HIGH (ref 70–99)
Glucose-Capillary: 157 mg/dL — ABNORMAL HIGH (ref 70–99)

## 2021-02-05 SURGERY — INCISION AND DRAINAGE, ABSCESS
Anesthesia: General

## 2021-02-05 MED ORDER — DEXAMETHASONE SODIUM PHOSPHATE 4 MG/ML IJ SOLN
INTRAMUSCULAR | Status: DC | PRN
  Administered 2021-02-05: 4 mg via INTRAVENOUS

## 2021-02-05 MED ORDER — METRONIDAZOLE 500 MG/100ML IV SOLN
500.0000 mg | INTRAVENOUS | Status: AC
  Administered 2021-02-05: 500 mg via INTRAVENOUS

## 2021-02-05 MED ORDER — OXYCODONE HCL 5 MG PO TABS
ORAL_TABLET | ORAL | Status: AC
  Administered 2021-02-05: 5 mg via ORAL
  Filled 2021-02-05: qty 1

## 2021-02-05 MED ORDER — 0.9 % SODIUM CHLORIDE (POUR BTL) OPTIME
TOPICAL | Status: DC | PRN
  Administered 2021-02-05: 1000 mL

## 2021-02-05 MED ORDER — DIBUCAINE (PERIANAL) 1 % EX OINT
TOPICAL_OINTMENT | CUTANEOUS | Status: AC
  Filled 2021-02-05: qty 28

## 2021-02-05 MED ORDER — BUPIVACAINE-EPINEPHRINE (PF) 0.5% -1:200000 IJ SOLN
INTRAMUSCULAR | Status: AC
  Filled 2021-02-05: qty 30

## 2021-02-05 MED ORDER — ACETAMINOPHEN 500 MG PO TABS
ORAL_TABLET | ORAL | Status: AC
  Administered 2021-02-05: 1000 mg via ORAL
  Filled 2021-02-05: qty 2

## 2021-02-05 MED ORDER — FENTANYL CITRATE (PF) 100 MCG/2ML IJ SOLN
INTRAMUSCULAR | Status: AC
  Filled 2021-02-05: qty 2

## 2021-02-05 MED ORDER — BUPIVACAINE LIPOSOME 1.3 % IJ SUSP
INTRAMUSCULAR | Status: DC | PRN
  Administered 2021-02-05: 16 mL

## 2021-02-05 MED ORDER — DEXMEDETOMIDINE (PRECEDEX) IN NS 20 MCG/5ML (4 MCG/ML) IV SYRINGE
PREFILLED_SYRINGE | INTRAVENOUS | Status: DC | PRN
  Administered 2021-02-05: 4 ug via INTRAVENOUS
  Administered 2021-02-05: 8 ug via INTRAVENOUS
  Administered 2021-02-05: 4 ug via INTRAVENOUS

## 2021-02-05 MED ORDER — TRAMADOL HCL 50 MG PO TABS
50.0000 mg | ORAL_TABLET | Freq: Four times a day (QID) | ORAL | 0 refills | Status: AC | PRN
  Filled 2021-02-05: qty 10, 3d supply, fill #0

## 2021-02-05 MED ORDER — FENTANYL CITRATE (PF) 100 MCG/2ML IJ SOLN
INTRAMUSCULAR | Status: DC | PRN
  Administered 2021-02-05 (×2): 25 ug via INTRAVENOUS
  Administered 2021-02-05: 50 ug via INTRAVENOUS

## 2021-02-05 MED ORDER — LIDOCAINE 2% (20 MG/ML) 5 ML SYRINGE
INTRAMUSCULAR | Status: DC | PRN
  Administered 2021-02-05: 40 mg via INTRAVENOUS
  Administered 2021-02-05: 60 mg via INTRAVENOUS

## 2021-02-05 MED ORDER — FENTANYL CITRATE (PF) 100 MCG/2ML IJ SOLN: 25.0000 ug | INTRAMUSCULAR | Status: DC | PRN

## 2021-02-05 MED ORDER — LACTATED RINGERS IV SOLN: INTRAVENOUS | Status: DC

## 2021-02-05 MED ORDER — BUPIVACAINE-EPINEPHRINE 0.5% -1:200000 IJ SOLN
INTRAMUSCULAR | Status: DC | PRN
  Administered 2021-02-05: 16 mL

## 2021-02-05 MED ORDER — SODIUM CHLORIDE 0.9 % IV SOLN
INTRAVENOUS | Status: AC
  Filled 2021-02-05: qty 20

## 2021-02-05 MED ORDER — PROMETHAZINE HCL 25 MG/ML IJ SOLN: 6.2500 mg | INTRAMUSCULAR | Status: DC | PRN

## 2021-02-05 MED ORDER — ORAL CARE MOUTH RINSE: 15.0000 mL | Freq: Once | OROMUCOSAL | Status: AC

## 2021-02-05 MED ORDER — SODIUM CHLORIDE 0.9 % IV SOLN
2.0000 g | INTRAVENOUS | Status: AC
  Administered 2021-02-05: 2 g via INTRAVENOUS

## 2021-02-05 MED ORDER — ONDANSETRON HCL 4 MG/2ML IJ SOLN
INTRAMUSCULAR | Status: DC | PRN
  Administered 2021-02-05: 4 mg via INTRAVENOUS

## 2021-02-05 MED ORDER — PROPOFOL 10 MG/ML IV BOLUS
INTRAVENOUS | Status: DC | PRN
  Administered 2021-02-05: 200 mg via INTRAVENOUS

## 2021-02-05 MED ORDER — METRONIDAZOLE 500 MG/100ML IV SOLN
INTRAVENOUS | Status: AC
  Filled 2021-02-05: qty 100

## 2021-02-05 MED ORDER — CHLORHEXIDINE GLUCONATE CLOTH 2 % EX PADS: 6.0000 | MEDICATED_PAD | Freq: Once | CUTANEOUS | Status: DC

## 2021-02-05 MED ORDER — MIDAZOLAM HCL 2 MG/2ML IJ SOLN
INTRAMUSCULAR | Status: AC
  Filled 2021-02-05: qty 2

## 2021-02-05 MED ORDER — AMISULPRIDE (ANTIEMETIC) 5 MG/2ML IV SOLN: 10.0000 mg | Freq: Once | INTRAVENOUS | Status: DC | PRN

## 2021-02-05 MED ORDER — DEXMEDETOMIDINE (PRECEDEX) IN NS 20 MCG/5ML (4 MCG/ML) IV SYRINGE
PREFILLED_SYRINGE | INTRAVENOUS | Status: AC
  Filled 2021-02-05: qty 10

## 2021-02-05 MED ORDER — KETOROLAC TROMETHAMINE 30 MG/ML IJ SOLN: 30.0000 mg | Freq: Once | INTRAMUSCULAR | Status: AC | PRN

## 2021-02-05 MED ORDER — EPHEDRINE SULFATE 50 MG/ML IJ SOLN
INTRAMUSCULAR | Status: DC | PRN
  Administered 2021-02-05: 10 mg via INTRAVENOUS

## 2021-02-05 MED ORDER — ACETAMINOPHEN 500 MG PO TABS: 1000.0000 mg | ORAL_TABLET | ORAL | Status: AC

## 2021-02-05 MED ORDER — OXYCODONE HCL 5 MG PO TABS: 5.0000 mg | ORAL_TABLET | Freq: Once | ORAL | Status: AC | PRN

## 2021-02-05 MED ORDER — MIDAZOLAM HCL 5 MG/5ML IJ SOLN
INTRAMUSCULAR | Status: DC | PRN
  Administered 2021-02-05: 2 mg via INTRAVENOUS

## 2021-02-05 MED ORDER — METHYLENE BLUE 0.5 % INJ SOLN
INTRAVENOUS | Status: AC
  Filled 2021-02-05: qty 10

## 2021-02-05 MED ORDER — KETOROLAC TROMETHAMINE 30 MG/ML IJ SOLN
INTRAMUSCULAR | Status: AC
  Administered 2021-02-05: 30 mg via INTRAVENOUS
  Filled 2021-02-05: qty 1

## 2021-02-05 MED ORDER — CHLORHEXIDINE GLUCONATE 0.12 % MT SOLN
15.0000 mL | Freq: Once | OROMUCOSAL | Status: AC
  Administered 2021-02-05: 15 mL via OROMUCOSAL

## 2021-02-05 MED ORDER — OXYCODONE HCL 5 MG/5ML PO SOLN: 5.0000 mg | Freq: Once | ORAL | Status: AC | PRN

## 2021-02-05 SURGICAL SUPPLY — 41 items
APL SKNCLS STERI-STRIP NONHPOA (GAUZE/BANDAGES/DRESSINGS)
BAG COUNTER SPONGE SURGICOUNT (BAG) IMPLANT
BAG SPNG CNTER NS LX DISP (BAG)
BENZOIN TINCTURE PRP APPL 2/3 (GAUZE/BANDAGES/DRESSINGS) ×1 IMPLANT
BLADE SURG 15 STRL LF DISP TIS (BLADE) IMPLANT
BLADE SURG 15 STRL SS (BLADE) ×2
CNTNR URN SCR LID CUP LEK RST (MISCELLANEOUS) ×1 IMPLANT
CONT SPEC 4OZ STRL OR WHT (MISCELLANEOUS) ×2
COVER SURGICAL LIGHT HANDLE (MISCELLANEOUS) ×2 IMPLANT
DECANTER SPIKE VIAL GLASS SM (MISCELLANEOUS) ×2 IMPLANT
DRAPE LAPAROTOMY T 102X78X121 (DRAPES) ×2 IMPLANT
DRSG PAD ABDOMINAL 8X10 ST (GAUZE/BANDAGES/DRESSINGS) ×1 IMPLANT
ELECT REM PT RETURN 15FT ADLT (MISCELLANEOUS) ×2 IMPLANT
GAUZE 4X4 16PLY ~~LOC~~+RFID DBL (SPONGE) ×2 IMPLANT
GAUZE SPONGE 4X4 12PLY STRL (GAUZE/BANDAGES/DRESSINGS) ×1 IMPLANT
GLOVE SURG ENC MOIS LTX SZ7.5 (GLOVE) ×2 IMPLANT
GLOVE SURG UNDER LTX SZ8 (GLOVE) ×2 IMPLANT
GOWN STRL REUS W/TWL XL LVL3 (GOWN DISPOSABLE) ×4 IMPLANT
KIT BASIN OR (CUSTOM PROCEDURE TRAY) ×2 IMPLANT
KIT TURNOVER KIT A (KITS) ×2 IMPLANT
LOOP VESSEL MAXI BLUE (MISCELLANEOUS) ×1 IMPLANT
NEEDLE HYPO 22GX1.5 SAFETY (NEEDLE) ×2 IMPLANT
PACK BASIC VI WITH GOWN DISP (CUSTOM PROCEDURE TRAY) ×2 IMPLANT
PANTS MESH DISP LRG (UNDERPADS AND DIAPERS) ×1 IMPLANT
PANTS MESH DISPOSABLE L (UNDERPADS AND DIAPERS) ×1
PENCIL SMOKE EVACUATOR (MISCELLANEOUS) IMPLANT
SHEARS HARMONIC 9CM CVD (BLADE) IMPLANT
SURGILUBE 2OZ TUBE FLIPTOP (MISCELLANEOUS) ×2 IMPLANT
SUT CHROMIC 2 0 SH (SUTURE) ×2 IMPLANT
SUT CHROMIC 3 0 SH 27 (SUTURE) IMPLANT
SUT SILK 0 (SUTURE) ×2
SUT SILK 0 30XBRD TIE 6 (SUTURE) IMPLANT
SUT VIC AB 2-0 SH 27 (SUTURE)
SUT VIC AB 2-0 SH 27X BRD (SUTURE) IMPLANT
SUT VIC AB 2-0 UR6 27 (SUTURE) ×12 IMPLANT
SUT VIC AB 3-0 SH 27 (SUTURE) ×2
SUT VIC AB 3-0 SH 27X BRD (SUTURE) IMPLANT
SYR 20ML LL LF (SYRINGE) ×2 IMPLANT
SYR 3ML LL SCALE MARK (SYRINGE) IMPLANT
TOWEL OR 17X26 10 PK STRL BLUE (TOWEL DISPOSABLE) ×2 IMPLANT
TOWEL OR NON WOVEN STRL DISP B (DISPOSABLE) ×2 IMPLANT

## 2021-02-05 NOTE — Transfer of Care (Signed)
Immediate Anesthesia Transfer of Care Note  Patient: Sean Schaefer  Procedure(s) Performed: Procedure(s): INCISION AND DRAINAGE OF PERIANAL ABSCESS (N/A) PLACEMENT OF SETONS (N/A) ANORECTAL EXAM UNDER ANESTHESIA (N/A) FLEXIBLE SIGMOIDOSCOPY WITH BIOPSY (N/A)  Patient Location: PACU  Anesthesia Type:General  Level of Consciousness:  sedated, patient cooperative and responds to stimulation  Airway & Oxygen Therapy:Patient Spontanous Breathing and Patient connected to face mask oxgen  Post-op Assessment:  Report given to PACU RN and Post -op Vital signs reviewed and stable  Post vital signs:  Reviewed and stable  Last Vitals:  Vitals:   02/05/21 0645 02/05/21 1005  BP: (!) 147/87 131/73  Pulse: 97 94  Resp: 18 13  Temp: 36.7 C 36.5 C  SpO2: 50% 15%    Complications: No apparent anesthesia complications

## 2021-02-05 NOTE — Op Note (Signed)
02/05/2021  9:56 AM  PATIENT:  Sean Schaefer  55 y.o. 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  EBL: 2 mL  PLAN OF CARE: Discharge to home after PACU  PATIENT DISPOSITION:  PACU - hemodynamically stable.  OR FINDINGS: Anastomosis of pouch 2 cm from anal verge - 1-2 cm above the dentate line. Left lateral perianal abscess and transsphincteric fistulas; anterior midline transsphincteric fistula to distal rectum/anal canal just distal to the pouch-anal anastomosis. These were controlled with blue vessel loop setons - unclear if they actually share an internal opening vs a bridge between the two. Healthy/normal appearing J pouch without significant inflammation; significant inflammation in the rectal cuff remnant/anal canal just distal to the anastomosis.  Pouchoscopy was performed and biopsies of both the J-pouch and the anal canal/distal rectum were obtained.  The fistulous disease appears distal to the anastomosis.  DESCRIPTION: The patient was identified in the preoperative holding area and taken to the OR where he was placed on the operating room table. SCDs were placed.  General LMA anesthesia was induced without difficulty. The patient was then positioned in high lithotomy with Allen stirrups. Pressure points were then evaluated and  padded.  He was then prepped and draped in usual sterile fashion.  A surgical timeout was performed indicating the correct patient, procedure, and positioning.  A perianal block was performed using a dilute mixture of 0.25% Marcaine with epinephrine and Exparel.   After ascertaining that an appropriate level of anesthesia had been achieved, a well lubricated digital rectal exam was performed. This demonstrated no palpable masses.  The pouch-anal anastomosis is approximately 2 cm from the anal verge and patent.  A Hill-Ferguson anoscope was into the anal canal and circumferential inspection demonstrated significant inflammation within the anal canal with granular tissue present.  The internal openings were identified in the anterior midline and just to the left of the midline.  He has 2 appearing sinus tracts in the left ischioanal fossa that when carefully probed with a fistula probe communicate with the anal canal just distal to the pouch anastomosis in the left anterior position.  There is an associated 2 cc abscess that is opened to drain through the external opening. This fistula is then controlled with a draining blue vessel loop seton.  There is a fistula opening in the anterior midline that is also carefully probed and found to communicate with the anterior midline again distal to the pouch anal anastomosis.  This is controlled with a blue vessel loop seton.  We turned our attention to performing the pouchoscopy to evaluate the amount of inflammation within his pouch.  A flexible sigmoidoscope was lubricated and inserted through the anal canal.  Under direct visualization, the pouch is intubated.  The pouch and the tip of the J are normal in appearance.  There is at most scant erythema within the pouch but no significant  ulcers or disease.  The neoterminal ileum was normal in appearance.  Biopsies of the pouch are obtained.  Distal to his pouch anastomosis, there was significant inflammation.  This is  biopsied.  These were submitted separately.  These were passed off the specimen.  Photos were taken and uploaded to the media section through the epic app.  The endoscope was not set up to take photos in the OR.  Sponge, needle, and instrument counts were reported correct x2.  He is taken out of the lithotomy position.  A dressing consisting of 4 x 4's, ABD, mesh underwear was placed.  He is awakened from anesthesia, extubated, transferred to a stretcher for transport to PACU in satisfactory condition.  DISPOSITION: PACU in satisfactory condition.

## 2021-02-05 NOTE — Anesthesia Postprocedure Evaluation (Signed)
Anesthesia Post Note  Patient: Sean Schaefer  Procedure(s) Performed: INCISION AND DRAINAGE OF PERIANAL ABSCESS PLACEMENT OF SETONS X2 ANORECTAL EXAM UNDER ANESTHESIA FLEXIBLE POUCHOSCOPY WITH BIOPSY     Patient location during evaluation: PACU Anesthesia Type: General Level of consciousness: awake Pain management: pain level controlled Vital Signs Assessment: post-procedure vital signs reviewed and stable Respiratory status: spontaneous breathing, nonlabored ventilation, respiratory function stable and patient connected to nasal cannula oxygen Cardiovascular status: blood pressure returned to baseline and stable Postop Assessment: no apparent nausea or vomiting Anesthetic complications: no   No notable events documented.  Last Vitals:  Vitals:   02/05/21 1045 02/05/21 1100  BP: (!) 152/88 (!) 153/89  Pulse: 74 66  Resp: 13 (!) 21  Temp: 36.6 C 36.6 C  SpO2: 99% 99%    Last Pain:  Vitals:   02/05/21 1045  TempSrc:   PainSc: 3                  [name] P Zara Wendt

## 2021-02-05 NOTE — Discharge Instructions (Signed)
ANORECTAL SURGERY: POST OP INSTRUCTIONS  DIET: Follow a light bland diet the first 24 hours after arrival home, such as soup, liquids, crackers, etc.  Be sure to include lots of fluids daily.  Avoid fast food or heavy meals as your are more likely to get nauseated.  Eat a low fat diet the next few days after surgery.   Some bleeding with bowel movements is expected for the first couple of days but this should stop in between bowel movements  Take your usually prescribed home medications unless otherwise directed.  PAIN CONTROL: It is helpful to take an over-the-counter pain medication regularly for the first few days/weeks.  Choose from the following that works best for you: Ibuprofen (Advil, etc) Three 227m tabs every 6 hours as needed. Acetaminophen (Tylenol, etc) 500-6520mevery 6 hours as needed NOTE: You may take both of these medications together - most patients find it most helpful when alternating between the two (i.e. Ibuprofen at 6am, tylenol at 9am, ibuprofen at 12pm ...)Marland KitchenA  prescription for pain medication may have been prescribed for you at discharge.  Take your pain medication as prescribed.  If you are having problems/concerns with the prescription medicine, please call usKoreaor further advice.  Avoid getting constipated.  Between the surgery and the pain medications, it is common to experience some constipation.  Increasing fluid intake (64oz of water per day) and taking a fiber supplement (such as Metamucil, Citrucel, FiberCon) 1-2 times a day regularly will usually help prevent this problem from occurring.  Take Miralax (over the counter) 1-2x/day while taking a narcotic pain medication. If no bowel movement after 48hours, you may additionally take a laxative like a bottle of Milk of Magnesia which can be purchased over the counter. Avoid enemas if possible as these are often painful.   Watch out for diarrhea.  If you have many loose bowel movements, simplify your diet to bland  foods.  Stop any stool softeners and decrease your fiber supplement. If this worsens or does not improve, please call usKorea Wash / shower every day.  If you were discharged with a dressing, you may remove this the day after your surgery. You may shower normally, getting soap/water on your wound, particularly after bowel movements.  Soaking in a warm bath filled a couple inches ("Sitz bath") is a great way to clean the area after a bowel movement and many patients find it is a way to soothe the area.  ACTIVITIES as tolerated:   You may resume regular (light) daily activities beginning the next day--such as daily self-care, walking, climbing stairs--gradually increasing activities as tolerated.  If you can walk 30 minutes without difficulty, it is safe to try more intense activity such as jogging, treadmill, bicycling, low-impact aerobics, etc. Refrain from any heavy lifting or straining for the first 2 weeks after your procedure, particularly if your surgery was for hemorrhoids. Avoid activities that make your pain worse You may drive when you are no longer taking prescription pain medication, you can comfortably wear a seatbelt, and you can safely maneuver your car and apply brakes.  FOLLOW UP in our office Please call [facility] at [phone] to set up an appointment to see your surgeon in the office for a follow-up appointment approximately 2 weeks after your surgery. Make sure that you call for this appointment the day you arrive home to insure a convenient appointment time.  9. If you have disability or family leave forms that need to be completed,  you may have them completed by your primary care physician's office; for return to work instructions, please ask our office staff and they will be happy to assist you in obtaining this documentation   When to call us [phone]: Poor pain control Reactions / problems with new medications (rash/itching, etc)  Fever over 101.5 F (38.5 C) Inability  to urinate Nausea/vomiting Worsening swelling or bruising Continued bleeding from incision. Increased pain, redness, or drainage from the incision  The clinic staff is available to answer your questions during regular business hours (8:30am-5pm).  Please don't hesitate to call and ask to speak to one of our nurses for clinical concerns.   A surgeon from [facility] is always on call at the hospitals   If you have a medical emergency, go to the nearest emergency room or call 911.   [facility], [address], [place], [place], [place]  [address] ? MAIN: [phone] FAX [phone] [email or web address]

## 2021-02-05 NOTE — H&P (Signed)
CC: Hx perianal abscesses   HPI: Mr. Eckard is a very pleasant 41yoM hx HTN, previously cured the diagnosis of all should've colitis and underwent a total proctocolectomy with ileostomy here in [place] with Dr. Benard Rink.  He was subsequently referred to the [facility] in [facility] and under the care of Dr. Abram Sander and creation of an ileal J-pouch.  He states that this all occurred in 1989.  In the years after surgery he has been dealing with recurrent perianal abscesses.  He has had multiple incision and drainage procedures.  He has also had multiple abscesses at home and he has been able to self express.  He was recently seen in our urgent office and underwent repeat incision and drainage.  He was referred to see me.  Currently, he has been doing well.  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  He denies any significant perianal pain or recurrent symptoms of abscess.  He denies any fevers or chills.  He reports his pouch function has been reasonable having 4-6 bowel movements per day with toothpaste consistency.  It does not appear he has had any sort of recent endoscopic evaluation of his pouch.  He does report that he's previously been on Humira but has not been on any sort of therapy for his IBD since 2015.  He denies any changes in his health or health history since we met in the office.   PMH: HTN, IBD   PSH: As above   FHx: Denies FHx of colorectal, breast, endometrial, ovarian or cervical cancer   Social: Denies use of tobacco/EtOH/drugs. Works multiple jobs including jobs in Chi[employer]. He was seen in the office with his wife whom works with [employer] in behavioral health   ROS: A comprehensive 10 system review of systems was completed with the patient and pertinent findings as noted above.  Past Medical History:  Diagnosis Date   Crohn's disease ([place])    Diabetes mellitus without complication ([place])    Hypertension     Past  Surgical History:  Procedure Laterality Date   COLON SURGERY     EYE SURGERY Left    INCISION AND DRAINAGE PERIRECTAL ABSCESS N/A 11/30/2020   Procedure: IRRIGATION AND DEBRIDEMENT PERIRECTAL ABSCESS;  Surgeon: Dwan Bolt, MD;  Location: [facility];  Service: General;  Laterality: N/A;   Perianal fistula repair  07/22/2010   TESTICLE REMOVAL Left 2000    Family History  Problem Relation Age of Onset   Hypertension Mother    Cancer Mother    Hypertension Father    Diabetes Paternal Grandmother    Diabetes Paternal Uncle    Diabetes Paternal Grandfather    Heart disease Neg Hx     Social:  reports that he has never smoked. He has never used smokeless tobacco. He reports that he does not drink alcohol and does not use drugs.  Allergies: No Known Allergies  Medications: I have reviewed the patient's current medications.  Results for orders placed or performed during the hospital encounter of 02/05/21 (from the past 48 hour(s))  Glucose, capillary     Status: Abnormal   Collection Time: 02/05/21  6:51 AM  Result Value Ref Range   Glucose-Capillary 152 (H) 70 - 99 mg/dL    Comment: Glucose reference range applies only to samples taken after fasting for at least 8 hours.   Comment 1 Notify RN     No results found.  ROS - all of the  below systems have been reviewed with the patient and positives are indicated with bold text General: chills, fever or night sweats Eyes: blurry vision or double vision ENT: epistaxis or sore throat Allergy/Immunology: itchy/watery eyes or nasal congestion Hematologic/Lymphatic: bleeding problems, blood clots or swollen lymph nodes Endocrine: temperature intolerance or unexpected weight changes Breast: new or changing breast lumps or nipple discharge Resp: cough, shortness of breath, or wheezing CV: chest pain or dyspnea on exertion GI: as per HPI GU: dysuria, trouble voiding, or hematuria MSK: joint pain or joint stiffness Neuro: TIA or stroke  symptoms Derm: pruritus and skin lesion changes Psych: anxiety and depression  PE Blood pressure (!) 147/87, pulse 97, temperature 98.1 F (36.7 C), temperature source Oral, resp. rate 18, height 5' 10"  (1.778 m), weight 100.2 kg, SpO2 99 %. Constitutional: NAD; conversant Eyes: Moist conjunctiva; no lid lag; anicteric Neck: Trachea midline; no thyromegaly Lungs: Normal respiratory effort; no tactile fremitus CV: RRR; no palpable thrills; no pitting edema GI: Abd soft, NT; no palpable hepatosplenomegaly MSK: Normal range of motion of extremities Psychiatric: Appropriate affect; alert and oriented x3  Results for orders placed or performed during the hospital encounter of 02/05/21 (from the past 48 hour(s))  Glucose, capillary     Status: Abnormal   Collection Time: 02/05/21  6:51 AM  Result Value Ref Range   Glucose-Capillary 152 (H) 70 - 99 mg/dL    Comment: Glucose reference range applies only to samples taken after fasting for at least 8 hours.   Comment 1 Notify RN     No results found.   A/P: Mr. Antonacci is a very pleasant 35yoM with hx of presumed UC - open rPC/J pouch - colectomy with Dr. Leafy Kindle, pouch with Dr. Owens Loffler at [facility] now with almost certainly perianal Crohn's disease with what I suspect to be fistula to his pouch  -He has previously been on Humira but has been off all therapy for 7 years despite having active fistula. He has requested a referral to a gastroenterologist. We will place a referral to [facility] for medical management of what I am highly suspicious represents perianal Crohn's disease  -We discussed options going forward. Given the frequency with which she is having abscesses, we discussed proceeding with anorectal exam under anesthesia, possible incision and drainage of perianal abscesses of present, interrogation of his fistulous tracts with placement of setons. We also concurrently performed pouchoscopy with biopsies given the interval since his  last endoscopic evaluation.  Nadeen Landau, MD [facility] Use [email or web address] to contact on call provider

## 2021-02-05 NOTE — Anesthesia Procedure Notes (Signed)
Procedure Name: LMA Insertion Date/Time: 02/05/2021 9:13 AM Performed by: Lavina Hamman, CRNA Pre-anesthesia Checklist: Patient identified, Emergency Drugs available, Suction available and Patient being monitored Patient Re-evaluated:Patient Re-evaluated prior to induction Oxygen Delivery Method: Circle System Utilized Preoxygenation: Pre-oxygenation with 100% oxygen Induction Type: IV induction Ventilation: Mask ventilation without difficulty LMA: LMA with gastric port inserted LMA Size: 5.0 Number of attempts: 1 Airway Equipment and Method: Bite block Placement Confirmation: positive ETCO2 Tube secured with: Tape Dental Injury: Teeth and Oropharynx as per pre-operative assessment  Comments: Easy LMA.

## 2021-02-06 ENCOUNTER — Other Ambulatory Visit (HOSPITAL_COMMUNITY): Payer: Self-pay

## 2021-02-06 ENCOUNTER — Encounter (HOSPITAL_COMMUNITY): Payer: Self-pay | Admitting: Surgery

## 2021-02-06 LAB — SURGICAL PATHOLOGY

## 2021-02-12 ENCOUNTER — Ambulatory Visit: Payer: No Typology Code available for payment source | Admitting: Gastroenterology

## 2021-02-20 ENCOUNTER — Other Ambulatory Visit: Payer: Self-pay

## 2021-02-20 NOTE — Progress Notes (Signed)
Error

## 2021-03-22 ENCOUNTER — Ambulatory Visit: Payer: No Typology Code available for payment source | Admitting: Gastroenterology

## 2021-04-03 ENCOUNTER — Inpatient Hospital Stay (HOSPITAL_COMMUNITY): Payer: No Typology Code available for payment source | Admitting: Anesthesiology

## 2021-04-03 ENCOUNTER — Encounter (HOSPITAL_COMMUNITY)
Admission: EM | Disposition: A | Discharge status: Home / Self Care | Payer: Self-pay | Source: Home / Self Care | Attending: Surgery

## 2021-04-03 ENCOUNTER — Encounter: Payer: Self-pay | Admitting: Gastroenterology

## 2021-04-03 ENCOUNTER — Inpatient Hospital Stay (HOSPITAL_COMMUNITY)
Admission: EM | Admit: 2021-04-03 | Discharge: 2021-04-05 | DRG: 854 | Disposition: A | Discharge status: Home / Self Care | Payer: No Typology Code available for payment source | Attending: Surgery | Admitting: Surgery

## 2021-04-03 ENCOUNTER — Other Ambulatory Visit: Payer: Self-pay

## 2021-04-03 ENCOUNTER — Encounter (HOSPITAL_COMMUNITY): Payer: Self-pay | Admitting: Emergency Medicine

## 2021-04-03 ENCOUNTER — Other Ambulatory Visit (INDEPENDENT_AMBULATORY_CARE_PROVIDER_SITE_OTHER): Payer: No Typology Code available for payment source

## 2021-04-03 ENCOUNTER — Emergency Department (HOSPITAL_COMMUNITY): Payer: No Typology Code available for payment source

## 2021-04-03 ENCOUNTER — Ambulatory Visit: Payer: No Typology Code available for payment source | Admitting: Gastroenterology

## 2021-04-03 VITALS — BP 124/74 | HR 136 | Ht 70.0 in | Wt 223.2 lb

## 2021-04-03 DIAGNOSIS — A419 Sepsis, unspecified organism: Principal | ICD-10-CM | POA: Diagnosis present

## 2021-04-03 DIAGNOSIS — E1165 Type 2 diabetes mellitus with hyperglycemia: Secondary | ICD-10-CM | POA: Diagnosis present

## 2021-04-03 DIAGNOSIS — L0291 Cutaneous abscess, unspecified: Secondary | ICD-10-CM

## 2021-04-03 DIAGNOSIS — K50913 Crohn's disease, unspecified, with fistula: Secondary | ICD-10-CM | POA: Diagnosis present

## 2021-04-03 DIAGNOSIS — K6289 Other specified diseases of anus and rectum: Secondary | ICD-10-CM

## 2021-04-03 DIAGNOSIS — K50113 Crohn's disease of large intestine with fistula: Secondary | ICD-10-CM

## 2021-04-03 DIAGNOSIS — K5090 Crohn's disease, unspecified, without complications: Secondary | ICD-10-CM | POA: Diagnosis present

## 2021-04-03 DIAGNOSIS — Z8249 Family history of ischemic heart disease and other diseases of the circulatory system: Secondary | ICD-10-CM

## 2021-04-03 DIAGNOSIS — E119 Type 2 diabetes mellitus without complications: Secondary | ICD-10-CM | POA: Diagnosis present

## 2021-04-03 DIAGNOSIS — Z9079 Acquired absence of other genital organ(s): Secondary | ICD-10-CM

## 2021-04-03 DIAGNOSIS — I10 Essential (primary) hypertension: Secondary | ICD-10-CM | POA: Diagnosis present

## 2021-04-03 DIAGNOSIS — K603 Anal fistula: Secondary | ICD-10-CM

## 2021-04-03 DIAGNOSIS — K50919 Crohn's disease, unspecified, with unspecified complications: Secondary | ICD-10-CM

## 2021-04-03 DIAGNOSIS — Z794 Long term (current) use of insulin: Secondary | ICD-10-CM

## 2021-04-03 DIAGNOSIS — L0231 Cutaneous abscess of buttock: Secondary | ICD-10-CM | POA: Diagnosis present

## 2021-04-03 DIAGNOSIS — K611 Rectal abscess: Secondary | ICD-10-CM | POA: Diagnosis present

## 2021-04-03 DIAGNOSIS — Z20822 Contact with and (suspected) exposure to covid-19: Secondary | ICD-10-CM | POA: Diagnosis present

## 2021-04-03 DIAGNOSIS — Z833 Family history of diabetes mellitus: Secondary | ICD-10-CM

## 2021-04-03 HISTORY — PX: INCISION AND DRAINAGE PERIRECTAL ABSCESS: SHX1804

## 2021-04-03 LAB — COMPREHENSIVE METABOLIC PANEL
ALT: 11 U/L (ref 0–53)
ALT: 12 U/L (ref 0–44)
AST: 14 U/L (ref 0–37)
AST: 17 U/L (ref 15–41)
Albumin: 3.8 g/dL (ref 3.5–5.2)
Albumin: 3.2 g/dL — ABNORMAL LOW (ref 3.5–5.0)
Alkaline Phosphatase: 84 U/L (ref 39–117)
Alkaline Phosphatase: 84 U/L (ref 38–126)
Anion gap: 15 (ref 5–15)
BUN: 16 mg/dL (ref 6–23)
BUN: 17 mg/dL (ref 6–20)
CO2: 28 mEq/L (ref 19–32)
CO2: 21 mmol/L — ABNORMAL LOW (ref 22–32)
Calcium: 9.5 mg/dL (ref 8.4–10.5)
Calcium: 8.8 mg/dL — ABNORMAL LOW (ref 8.9–10.3)
Chloride: 92 mEq/L — ABNORMAL LOW (ref 96–112)
Chloride: 95 mmol/L — ABNORMAL LOW (ref 98–111)
Creatinine, Ser: 1.24 mg/dL (ref 0.40–1.50)
Creatinine, Ser: 1.38 mg/dL — ABNORMAL HIGH (ref 0.61–1.24)
GFR, Estimated: >60 mL/min (ref >60)
GFR: 66.52 mL/min (ref >60.00)
Glucose, Bld: 243 mg/dL — ABNORMAL HIGH (ref 70–99)
Glucose, Bld: 252 mg/dL — ABNORMAL HIGH (ref 70–99)
Potassium: 4.2 mEq/L (ref 3.5–5.1)
Potassium: 4.4 mmol/L (ref 3.5–5.1)
Sodium: 131 mEq/L — ABNORMAL LOW (ref 135–145)
Sodium: 131 mmol/L — ABNORMAL LOW (ref 135–145)
Total Bilirubin: 1.6 mg/dL — ABNORMAL HIGH (ref 0.2–1.2)
Total Bilirubin: 2 mg/dL — ABNORMAL HIGH (ref 0.3–1.2)
Total Protein: 8.9 g/dL — ABNORMAL HIGH (ref 6.0–8.3)
Total Protein: 8.5 g/dL — ABNORMAL HIGH (ref 6.5–8.1)

## 2021-04-03 LAB — CBC WITH DIFFERENTIAL/PLATELET
Abs Immature Granulocytes: 0.08 10*3/uL — ABNORMAL HIGH (ref 0.00–0.07)
Basophils Absolute: 0 10*3/uL (ref 0.0–0.1)
Basophils Absolute: 0 10*3/uL (ref 0.0–0.1)
Basophils Relative: 0.4 % (ref 0.0–3.0)
Basophils Relative: 0 %
Eosinophils Absolute: 0.1 10*3/uL (ref 0.0–0.7)
Eosinophils Absolute: 0.1 10*3/uL (ref 0.0–0.5)
Eosinophils Relative: 0.5 % (ref 0.0–5.0)
Eosinophils Relative: 0 %
HCT: 46.7 % (ref 39.0–52.0)
HCT: 47.4 % (ref 39.0–52.0)
Hemoglobin: 16 g/dL (ref 13.0–17.0)
Hemoglobin: 16 g/dL (ref 13.0–17.0)
Immature Granulocytes: 1 %
Lymphocytes Relative: 6.9 % — ABNORMAL LOW (ref 12.0–46.0)
Lymphocytes Relative: 7 %
Lymphs Abs: 1 10*3/uL (ref 0.7–4.0)
Lymphs Abs: 1 10*3/uL (ref 0.7–4.0)
MCH: 29.9 pg (ref 26.0–34.0)
MCHC: 34.3 g/dL (ref 30.0–36.0)
MCHC: 33.8 g/dL (ref 30.0–36.0)
MCV: 86.3 fl (ref 78.0–100.0)
MCV: 88.6 fL (ref 80.0–100.0)
Monocytes Absolute: 1.6 10*3/uL — ABNORMAL HIGH (ref 0.1–1.0)
Monocytes Absolute: 1.8 10*3/uL — ABNORMAL HIGH (ref 0.1–1.0)
Monocytes Relative: 11.5 % (ref 3.0–12.0)
Monocytes Relative: 12 %
Neutro Abs: 11.2 10*3/uL — ABNORMAL HIGH (ref 1.4–7.7)
Neutro Abs: 12.2 10*3/uL — ABNORMAL HIGH (ref 1.7–7.7)
Neutrophils Relative %: 80.7 % — ABNORMAL HIGH (ref 43.0–77.0)
Neutrophils Relative %: 80 %
Platelets: 209 10*3/uL (ref 150.0–400.0)
Platelets: 210 10*3/uL (ref 150–400)
RBC: 5.41 Mil/uL (ref 4.22–5.81)
RBC: 5.35 MIL/uL (ref 4.22–5.81)
RDW: 12.2 % (ref 11.5–15.5)
RDW: 11.7 % (ref 11.5–15.5)
WBC: 13.8 10*3/uL — ABNORMAL HIGH (ref 4.0–10.5)
WBC: 15.2 10*3/uL — ABNORMAL HIGH (ref 4.0–10.5)
nRBC: 0 % (ref 0.0–0.2)

## 2021-04-03 LAB — RESP PANEL BY RT-PCR (FLU A&B, COVID) ARPGX2
Influenza A by PCR: NEGATIVE
Influenza B by PCR: NEGATIVE
SARS Coronavirus 2 by RT PCR: NEGATIVE

## 2021-04-03 LAB — PROTIME-INR
INR: 1.1 (ref 0.8–1.2)
Prothrombin Time: 13.8 seconds (ref 11.4–15.2)

## 2021-04-03 LAB — APTT: aPTT: 28 seconds (ref 24–36)

## 2021-04-03 LAB — LACTIC ACID, PLASMA: Lactic Acid, Venous: 1.9 mmol/L (ref 0.5–1.9)

## 2021-04-03 IMAGING — CT CT PELVIS W/ CM
2 of 3 series · 16 of 46 positions shown, 18 images · IV contrast (Omni 300)
Comparison: [DATE]

CLINICAL DATA: Rectal abscess for 4 days,, previous incision and
drainage for rectal abscess [DATE], history of Crohn disease

EXAM:
CT PELVIS WITH CONTRAST
TECHNIQUE: Multidetector CT imaging of the pelvis was performed using the
standard protocol following the bolus administration of intravenous
contrast.
CONTRAST:  80mL OMNIPAQUE IOHEXOL 350 MG/ML SOLN

[Series 3: pelvis with 5.0 · axial · 0.85mm/px · z∈[+882,+1152]mm · 13 of 64 slices shown, 15 images]
[im 5/64  soft-tissue]
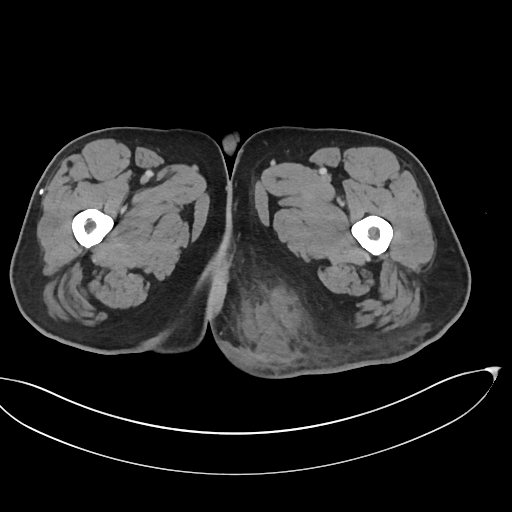
[im 5/64  bone]
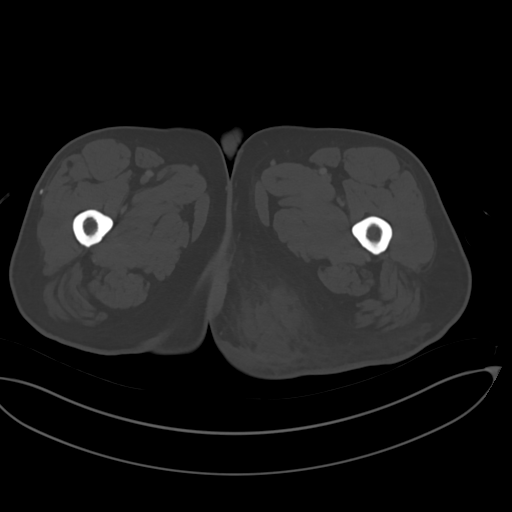
[im 9/64  soft-tissue]
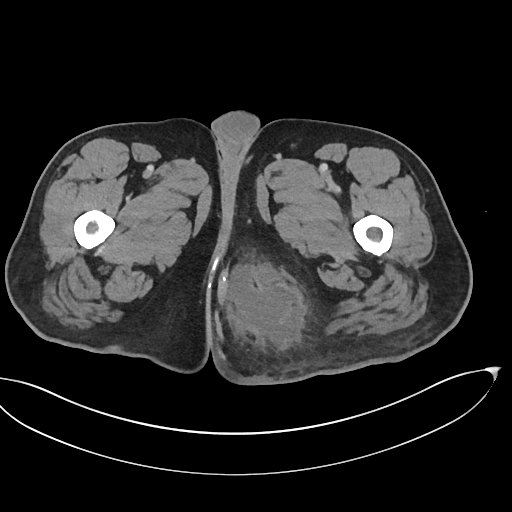
[im 13/64  soft-tissue]
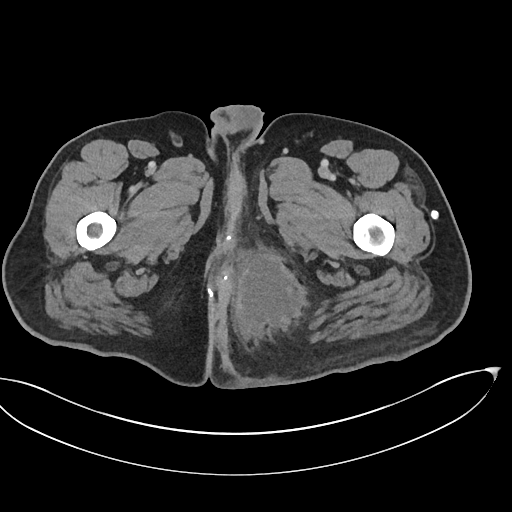
[im 19/64  soft-tissue]
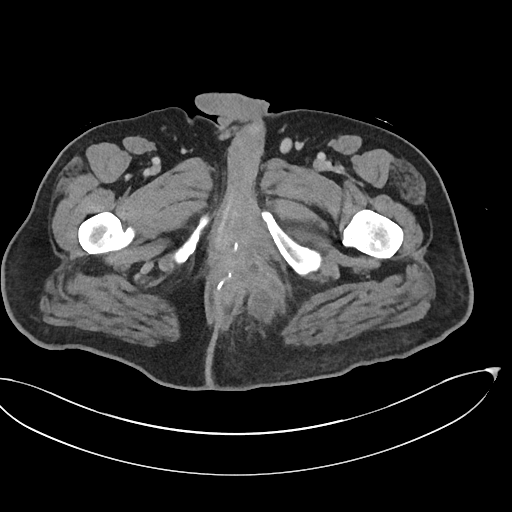
[im 23/64  soft-tissue]
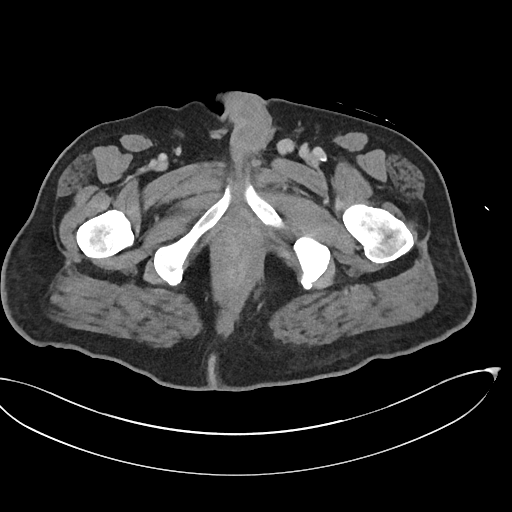
[im 27/64  soft-tissue]
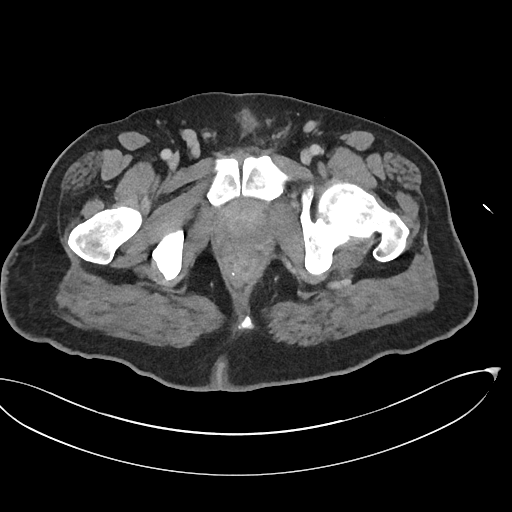
[im 33/64  soft-tissue]
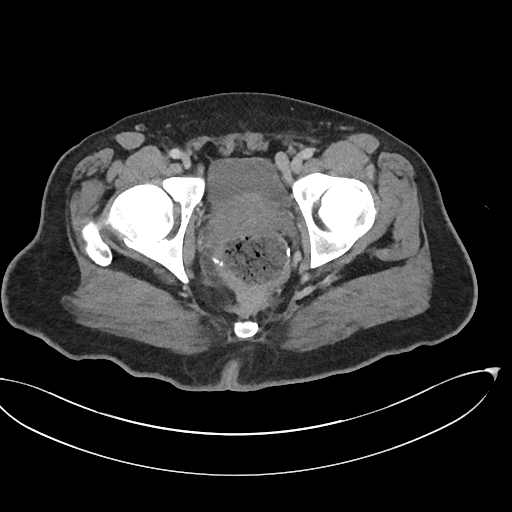
[im 37/64  soft-tissue]
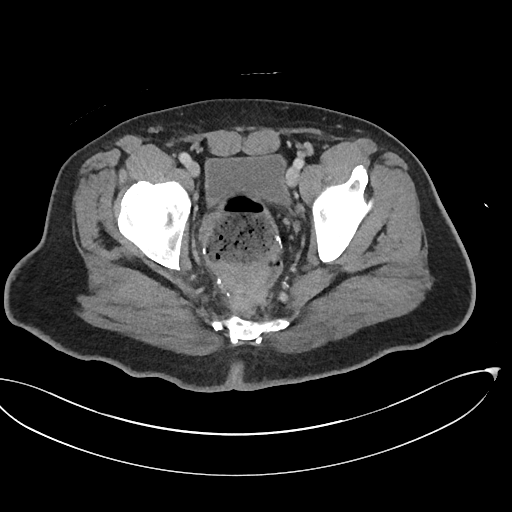
[im 41/64  soft-tissue]
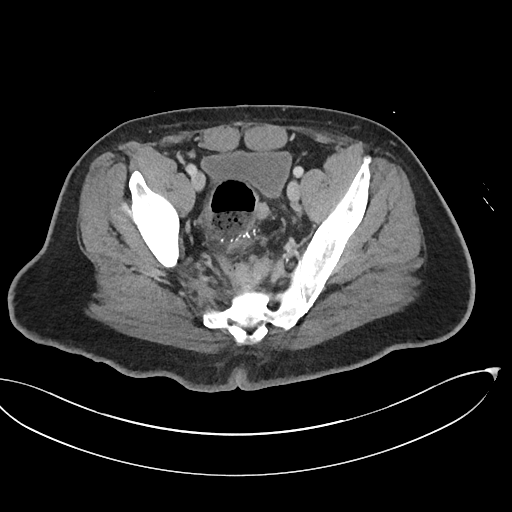
[im 41/64  bone]
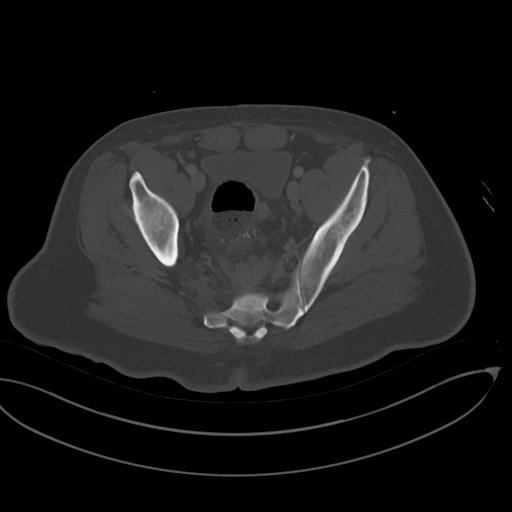
[im 45/64  soft-tissue]
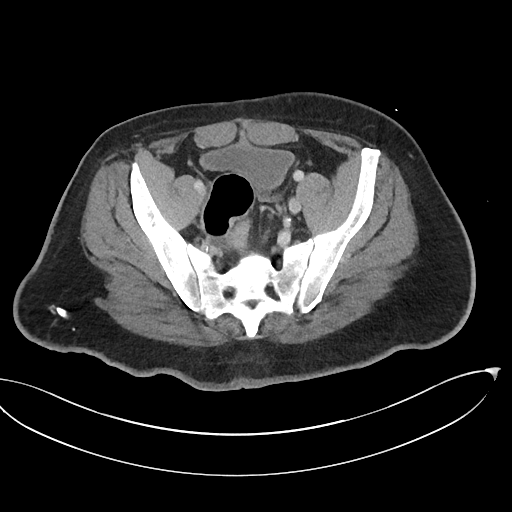
[im 51/64  soft-tissue]
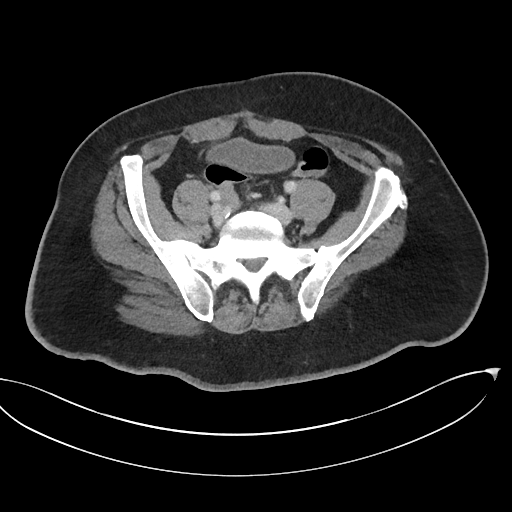
[im 55/64  soft-tissue]
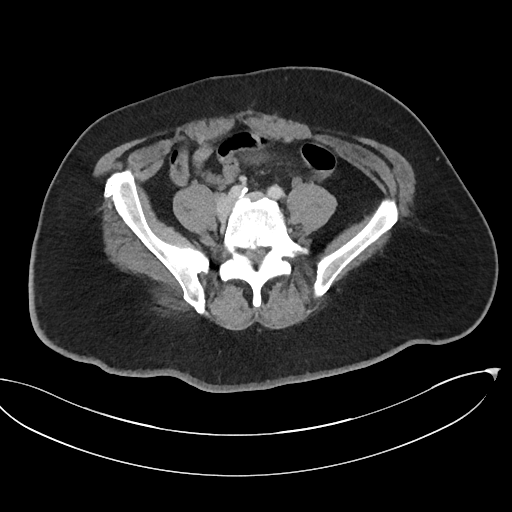
[im 59/64  soft-tissue]
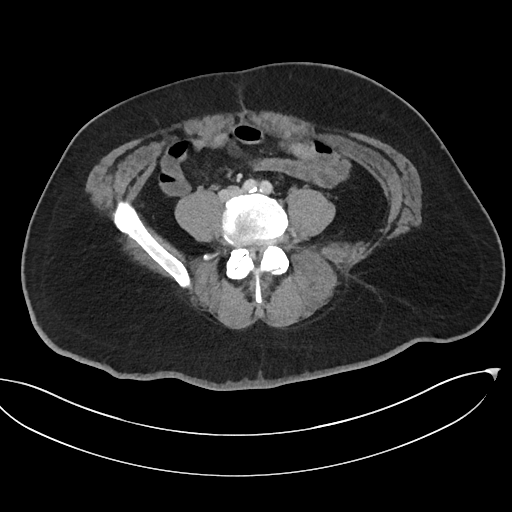

[Series 5: pelvis with 2.0 cor · coronal · 0.71mm/px · 3 of 140 slices shown]
[im 47/140  soft-tissue]
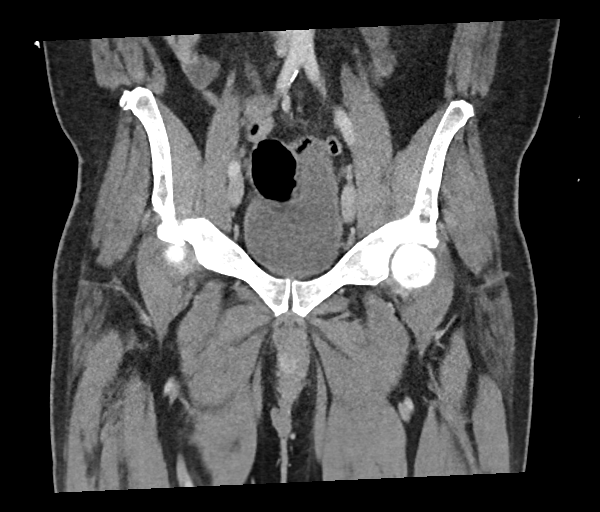
[im 62/140  soft-tissue]
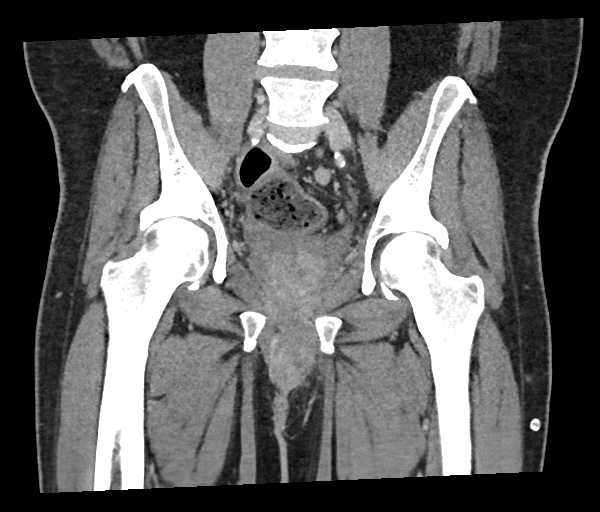
[im 78/140  soft-tissue]
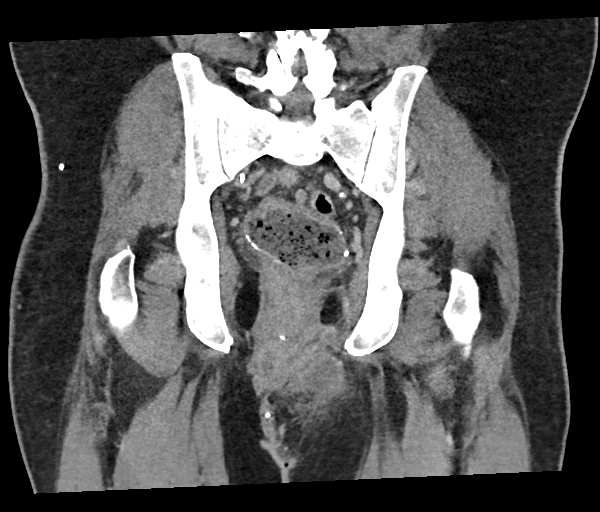

[16 of 46 positions shown; findings below may reference images not displayed]

FINDINGS: Urinary Tract:  The distal ureters and bladder are unremarkable.

Bowel: No bowel obstruction or ileus. Postsurgical changes are seen
from subtotal colectomy and reanastomosis. There is wall thickening
at the site of anastomosis within the ileal pouch which may reflect
recurrent inflammatory bowel disease.

There is a large abscess within the left perianal region, measuring
up to 7.6 x 5.6 cm in transverse dimension and extending
approximately 10.3 cm in craniocaudal extent. There is likely
fistulous connection with the anal verge to the left midline.
Inflammatory changes extend into the left aspect of the perineum may
involve the base of the penis, with a small gas and fluid collection
reference image 48/3 measuring 2.7 x 1.0 cm. A linear radiopaque
structure is seen coiled within the perineum, likely setons for
treatment of the known fistula.

Vascular/Lymphatic: Mild atherosclerosis. No pathologically enlarged
lymph nodes.

Reproductive: Mild prostate enlargement unchanged since prior study.

Other: No free intraperitoneal fluid or free gas. No abdominal wall
hernia.

Musculoskeletal: There are no acute or destructive bony lesions.
Reconstructed images demonstrate no additional findings.
IMPRESSION: 1. Large abscess within the medial aspect of the left buttock,
likely arising from a perianal fistula. Small extension of this
abscess at of the perineum at the base of the penis as above.
2. Coiled radiopaque structure within the perineum likely reflects
setons for treatment of known fistula.
3. Subtotal colectomy, with bowel wall thickening at the ileal pouch
consistent with inflammatory bowel disease. No evidence of
obstruction.
4.  Aortic Atherosclerosis ([IF]-[IF]).

## 2021-04-03 SURGERY — INCISION AND DRAINAGE, ABSCESS, PERIRECTAL
Anesthesia: General | Site: Buttocks | Laterality: Left

## 2021-04-03 MED ORDER — METHOCARBAMOL 500 MG PO TABS
1000.0000 mg | ORAL_TABLET | Freq: Three times a day (TID) | ORAL | Status: DC
  Administered 2021-04-04 – 2021-04-05 (×5): 1000 mg via ORAL
  Filled 2021-04-03 (×5): qty 2

## 2021-04-03 MED ORDER — SODIUM CHLORIDE 0.9 % IV SOLN
2.0000 g | Freq: Once | INTRAVENOUS | Status: AC
  Administered 2021-04-03: 2 g via INTRAVENOUS
  Filled 2021-04-03: qty 2

## 2021-04-03 MED ORDER — ONDANSETRON 4 MG PO TBDP: 4.0000 mg | ORAL_TABLET | Freq: Four times a day (QID) | ORAL | Status: DC | PRN

## 2021-04-03 MED ORDER — PIPERACILLIN-TAZOBACTAM 3.375 G IVPB
3.3750 g | Freq: Three times a day (TID) | INTRAVENOUS | Status: DC
  Filled 2021-04-03: qty 50

## 2021-04-03 MED ORDER — ROCURONIUM BROMIDE 10 MG/ML (PF) SYRINGE
PREFILLED_SYRINGE | INTRAVENOUS | Status: DC | PRN
  Administered 2021-04-03: 50 mg via INTRAVENOUS

## 2021-04-03 MED ORDER — MIDAZOLAM HCL 2 MG/2ML IJ SOLN
INTRAMUSCULAR | Status: AC
  Filled 2021-04-03: qty 2

## 2021-04-03 MED ORDER — KETOROLAC TROMETHAMINE 15 MG/ML IJ SOLN
15.0000 mg | Freq: Once | INTRAMUSCULAR | Status: AC
  Administered 2021-04-03: 15 mg via INTRAVENOUS
  Filled 2021-04-03: qty 1

## 2021-04-03 MED ORDER — DOCUSATE SODIUM 100 MG PO CAPS
100.0000 mg | ORAL_CAPSULE | Freq: Two times a day (BID) | ORAL | Status: DC
  Filled 2021-04-03 (×3): qty 1

## 2021-04-03 MED ORDER — PHENYLEPHRINE HCL (PRESSORS) 10 MG/ML IV SOLN
INTRAVENOUS | Status: DC | PRN
  Administered 2021-04-03 – 2021-04-04 (×4): 80 ug via INTRAVENOUS

## 2021-04-03 MED ORDER — ACETAMINOPHEN 325 MG PO TABS
650.0000 mg | ORAL_TABLET | Freq: Once | ORAL | Status: AC
  Administered 2021-04-03: 650 mg via ORAL
  Filled 2021-04-03: qty 2

## 2021-04-03 MED ORDER — ENOXAPARIN SODIUM 40 MG/0.4ML IJ SOSY
40.0000 mg | PREFILLED_SYRINGE | Freq: Every day | INTRAMUSCULAR | Status: DC
  Administered 2021-04-04 – 2021-04-05 (×2): 40 mg via SUBCUTANEOUS
  Filled 2021-04-03 (×2): qty 0.4

## 2021-04-03 MED ORDER — PROPOFOL 10 MG/ML IV BOLUS
INTRAVENOUS | Status: DC | PRN
  Administered 2021-04-03: 160 mg via INTRAVENOUS

## 2021-04-03 MED ORDER — OXYCODONE HCL 5 MG PO TABS
5.0000 mg | ORAL_TABLET | ORAL | Status: DC | PRN
Start: 2021-04-03 — End: 2021-04-05
  Administered 2021-04-04: 5 mg via ORAL
  Administered 2021-04-05 (×2): 10 mg via ORAL
  Filled 2021-04-03: qty 2
  Filled 2021-04-03 (×3): qty 1

## 2021-04-03 MED ORDER — LIDOCAINE 2% (20 MG/ML) 5 ML SYRINGE
INTRAMUSCULAR | Status: DC | PRN
  Administered 2021-04-03: 80 mg via INTRAVENOUS

## 2021-04-03 MED ORDER — ONDANSETRON HCL 4 MG/2ML IJ SOLN: 4.0000 mg | Freq: Four times a day (QID) | INTRAMUSCULAR | Status: DC | PRN

## 2021-04-03 MED ORDER — FENTANYL CITRATE (PF) 100 MCG/2ML IJ SOLN
INTRAMUSCULAR | Status: DC | PRN
  Administered 2021-04-03 – 2021-04-04 (×5): 50 ug via INTRAVENOUS

## 2021-04-03 MED ORDER — ACETAMINOPHEN 500 MG PO TABS
1000.0000 mg | ORAL_TABLET | Freq: Four times a day (QID) | ORAL | Status: DC
  Administered 2021-04-04 – 2021-04-05 (×6): 1000 mg via ORAL
  Filled 2021-04-03 (×6): qty 2

## 2021-04-03 MED ORDER — LIDOCAINE HCL 1 % IJ SOLN
INTRAMUSCULAR | Status: AC
  Filled 2021-04-03: qty 20

## 2021-04-03 MED ORDER — SODIUM CHLORIDE 0.9 % IV SOLN: 2.0000 g | Freq: Three times a day (TID) | INTRAVENOUS | Status: DC

## 2021-04-03 MED ORDER — VANCOMYCIN HCL 10 G IV SOLR
2250.0000 mg | Freq: Once | INTRAVENOUS | Status: AC
  Administered 2021-04-03: 2250 mg via INTRAVENOUS
  Filled 2021-04-03: qty 2250

## 2021-04-03 MED ORDER — MORPHINE SULFATE (PF) 2 MG/ML IV SOLN
2.0000 mg | INTRAVENOUS | Status: DC | PRN
  Administered 2021-04-04: 2 mg via INTRAVENOUS
  Filled 2021-04-03: qty 1

## 2021-04-03 MED ORDER — FENTANYL CITRATE (PF) 250 MCG/5ML IJ SOLN
INTRAMUSCULAR | Status: AC
  Filled 2021-04-03: qty 5

## 2021-04-03 MED ORDER — IOHEXOL 350 MG/ML SOLN
80.0000 mL | Freq: Once | INTRAVENOUS | Status: AC | PRN
  Administered 2021-04-03: 80 mL via INTRAVENOUS

## 2021-04-03 MED ORDER — SODIUM CHLORIDE 0.9 % IV BOLUS
1000.0000 mL | Freq: Once | INTRAVENOUS | Status: AC
  Administered 2021-04-03: 1000 mL via INTRAVENOUS

## 2021-04-03 MED ORDER — LACTATED RINGERS IV SOLN: INTRAVENOUS | Status: DC

## 2021-04-03 MED ORDER — BUPIVACAINE-EPINEPHRINE (PF) 0.25% -1:200000 IJ SOLN
INTRAMUSCULAR | Status: AC
  Filled 2021-04-03: qty 30

## 2021-04-03 MED ORDER — PROPOFOL 10 MG/ML IV BOLUS
INTRAVENOUS | Status: AC
  Filled 2021-04-03: qty 20

## 2021-04-03 MED ORDER — VANCOMYCIN VARIABLE DOSE PER UNSTABLE RENAL FUNCTION (PHARMACIST DOSING): Status: DC

## 2021-04-03 MED ORDER — MIDAZOLAM HCL 5 MG/5ML IJ SOLN
INTRAMUSCULAR | Status: DC | PRN
  Administered 2021-04-03: 2 mg via INTRAVENOUS

## 2021-04-03 SURGICAL SUPPLY — 42 items
BAG COUNTER SPONGE SURGICOUNT (BAG) ×1 IMPLANT
BAG SPNG CNTER NS LX DISP (BAG)
BNDG GAUZE ELAST 4 BULKY (GAUZE/BANDAGES/DRESSINGS) ×1 IMPLANT
BRIEF STRETCH FOR OB PAD LRG (UNDERPADS AND DIAPERS) ×2 IMPLANT
CANISTER SUCT 3000ML PPV (MISCELLANEOUS) ×1 IMPLANT
COVER SURGICAL LIGHT HANDLE (MISCELLANEOUS) ×2 IMPLANT
DRAIN PENROSE 0.25X18 (DRAIN) ×1 IMPLANT
DRSG PAD ABDOMINAL 8X10 ST (GAUZE/BANDAGES/DRESSINGS) ×1 IMPLANT
ELECT CAUTERY BLADE 6.4 (BLADE) ×2 IMPLANT
ELECT REM PT RETURN 9FT ADLT (ELECTROSURGICAL) ×2
ELECTRODE REM PT RTRN 9FT ADLT (ELECTROSURGICAL) IMPLANT
GAUZE PACKING IODOFORM 1X5 (PACKING) IMPLANT
GAUZE SPONGE 4X4 12PLY STRL (GAUZE/BANDAGES/DRESSINGS) ×1 IMPLANT
GLOVE SRG 8 PF TXTR STRL LF DI (GLOVE) IMPLANT
GLOVE SURG ENC MOIS LTX SZ7 (GLOVE) ×2 IMPLANT
GLOVE SURG UNDER POLY LF SZ7.5 (GLOVE) ×2 IMPLANT
GLOVE SURG UNDER POLY LF SZ8 (GLOVE) ×6
GOWN STRL REUS W/ TWL LRG LVL3 (GOWN DISPOSABLE) ×2 IMPLANT
GOWN STRL REUS W/TWL LRG LVL3 (GOWN DISPOSABLE) ×4
KIT BASIN OR (CUSTOM PROCEDURE TRAY) ×2 IMPLANT
KIT TURNOVER KIT B (KITS) ×2 IMPLANT
MANIFOLD NEPTUNE II (INSTRUMENTS) ×1 IMPLANT
NDL HYPO 25GX1X1/2 BEV (NEEDLE) ×1 IMPLANT
NEEDLE HYPO 25GX1X1/2 BEV (NEEDLE) IMPLANT
NS IRRIG 1000ML POUR BTL (IV SOLUTION) ×2 IMPLANT
PACK GENERAL/GYN (CUSTOM PROCEDURE TRAY) ×1 IMPLANT
PACK LITHOTOMY IV (CUSTOM PROCEDURE TRAY) ×2 IMPLANT
PAD ABD 8X10 STRL (GAUZE/BANDAGES/DRESSINGS) ×1 IMPLANT
PAD ARMBOARD 7.5X6 YLW CONV (MISCELLANEOUS) ×2 IMPLANT
PENCIL SMOKE EVACUATOR (MISCELLANEOUS) ×2 IMPLANT
SOL PREP POV-IOD 4OZ 10% (MISCELLANEOUS) ×1 IMPLANT
SPONGE T-LAP 18X18 ~~LOC~~+RFID (SPONGE) ×1 IMPLANT
SURGILUBE 2OZ TUBE FLIPTOP (MISCELLANEOUS) ×1 IMPLANT
SUT SILK 0 FSL (SUTURE) ×1 IMPLANT
SWAB COLLECTION DEVICE MRSA (MISCELLANEOUS) ×1 IMPLANT
SWAB CULTURE ESWAB REG 1ML (MISCELLANEOUS) ×1 IMPLANT
SYR CONTROL 10ML LL (SYRINGE) ×1 IMPLANT
TOWEL GREEN STERILE (TOWEL DISPOSABLE) ×2 IMPLANT
TOWEL GREEN STERILE FF (TOWEL DISPOSABLE) ×2 IMPLANT
TRAY FOLEY W/BAG SLVR 16FR (SET/KITS/TRAYS/PACK) ×2
TRAY FOLEY W/BAG SLVR 16FR ST (SET/KITS/TRAYS/PACK) IMPLANT
UNDERPAD 30X36 HEAVY ABSORB (UNDERPADS AND DIAPERS) ×3 IMPLANT

## 2021-04-03 NOTE — Patient Instructions (Signed)
Your provider has requested that you go to the basement level for lab work before leaving today. Press "B" on the elevator. The lab is located at the first door on the left as you exit the elevator.  Go to [facility] Emergency [facility].  If you are age 52 or older, your body mass index should be between 23-30. Your Body mass index is 32.03 kg/m. If this is out of the aforementioned range listed, please consider follow up with your Primary Care Provider.  If you are age 37 or younger, your body mass index should be between 19-25. Your Body mass index is 32.03 kg/m. If this is out of the aformentioned range listed, please consider follow up with your Primary Care Provider.   __________________________________________________________  The [facility] providers would like to encourage you to use [facility] to communicate with providers for non-urgent requests or questions.  Due to long hold times on the telephone, sending your provider a message by [facility] may be a faster and more efficient way to get a response.  Please allow 48 business hours for a response.  Please remember that this is for non-urgent requests.

## 2021-04-03 NOTE — Consult Note (Signed)
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  There was questionable small extension at the base of the penis.  Therefore, I was consulted.  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  He denies any difficulty voiding or penile or scrotal pain.  Past Medical History:  Diagnosis Date   Crohn's disease ([place])    Diabetes mellitus without complication ([place])    Hypertension    Ulcerative colitis ([place])    Past Surgical History:  Procedure Laterality Date   COLON SURGERY     EYE SURGERY Left    FLEXIBLE SIGMOIDOSCOPY N/A 02/05/2021   Procedure: FLEXIBLE POUCHOSCOPY WITH BIOPSY;  Surgeon: Ileana Roup, MD;  Location: [facility];  Service: General;  Laterality: N/A;   INCISION AND DRAINAGE ABSCESS N/A 02/05/2021   Procedure: INCISION AND DRAINAGE OF PERIANAL ABSCESS;  Surgeon: Ileana Roup, MD;  [facility];  Service: General;  Laterality: N/A;   INCISION AND DRAINAGE PERIRECTAL ABSCESS N/A 11/30/2020   Procedure: IRRIGATION AND DEBRIDEMENT PERIRECTAL ABSCESS;  Surgeon: Dwan Bolt, MD;  Location: [facility];  Service: General;  Laterality: N/A;   Perianal fistula repair  07/22/2010   PLACEMENT OF SETON N/A 02/05/2021   Procedure: PLACEMENT OF SETONS X2;  Surgeon: Ileana Roup, MD;  [facility];  Service: General;  Laterality: N/A;   RECTAL EXAM UNDER ANESTHESIA N/A 02/05/2021   Procedure: ANORECTAL EXAM UNDER ANESTHESIA;  Surgeon: Ileana Roup, MD;  [facility];  Service: General;  Laterality: N/A;   TESTICLE REMOVAL Left 2000    Home Medications:  (Not in a hospital admission)  Allergies: No Known Allergies  Family History  Problem Relation Age of Onset   Hypertension Mother     Cancer Mother    Hypertension Father    Diabetes Paternal Grandmother    Diabetes Paternal Uncle    Diabetes Paternal Grandfather    Heart disease Neg Hx    Social History:  reports that he has never smoked. He has never used smokeless tobacco. He reports that he does not drink alcohol and does not use drugs.  ROS: A complete review of systems was performed.  All systems are negative except for pertinent findings as noted. ROS   Physical Exam:  Vital signs in last 24 hours: Temp:  [99 F (37.2 C)-103.1 F (39.5 C)] 99 F (37.2 C) (09/13 1528) Pulse Rate:  [122-136] 122 (09/13 1712) Resp:  [18-24] 18 (09/13 1712) BP: (120-177)/(74-154) 120/87 (09/13 1712) SpO2:  [96 %-100 %] 100 % (09/13 1712) Weight:  [101.2 kg] 101.2 kg (09/13 1041) General:  Alert and oriented, No acute distress HEENT: Normocephalic, atraumatic Neck: No JVD or lymphadenopathy Cardiovascular: Regular rate and rhythm Lungs: Regular rate and effort Abdomen: Soft, nontender, nondistended, no abdominal masses Back: No CVA tenderness Genitourinary: Obvious left-sided perirectal abscess.  Induration in that area.  The scrotum was nontender.  Testicle was normal and palpable without any lesions or masses.  Penis is circumcised.  No significant edema.  No penile tenderness.  Palpation of the perineum around the area of the bulbar urethra was nontender. Extremities: No edema Neurologic: Grossly intact  Laboratory Data:  Results for orders placed or performed during the hospital encounter of 04/03/21 (from the past 24  hour(s))  Lactic acid, plasma     Status: None   Collection Time: 04/03/21  2:40 PM  Result Value Ref Range   Lactic Acid, Venous 1.9 0.5 - 1.9 mmol/L  Comprehensive metabolic panel     Status: Abnormal   Collection Time: 04/03/21  2:56 PM  Result Value Ref Range   Sodium 131 (L) 135 - 145 mmol/L   Potassium 4.4 3.5 - 5.1 mmol/L   Chloride 95 (L) 98 - 111 mmol/L   CO2 21 (L) 22 - 32 mmol/L    Glucose, Bld 252 (H) 70 - 99 mg/dL   BUN 17 6 - 20 mg/dL   Creatinine, Ser 1.38 (H) 0.61 - 1.24 mg/dL   Calcium 8.8 (L) 8.9 - 10.3 mg/dL   Total Protein 8.5 (H) 6.5 - 8.1 g/dL   Albumin 3.2 (L) 3.5 - 5.0 g/dL   AST 17 15 - 41 U/L   ALT 12 0 - 44 U/L   Alkaline Phosphatase 84 38 - 126 U/L   Total Bilirubin 2.0 (H) 0.3 - 1.2 mg/dL   GFR, Estimated >60 >60 mL/min   Anion gap 15 5 - 15  CBC WITH DIFFERENTIAL     Status: Abnormal   Collection Time: 04/03/21  2:56 PM  Result Value Ref Range   WBC 15.2 (H) 4.0 - 10.5 K/uL   RBC 5.35 4.22 - 5.81 MIL/uL   Hemoglobin 16.0 13.0 - 17.0 g/dL   HCT 47.4 39.0 - 52.0 %   MCV 88.6 80.0 - 100.0 fL   MCH 29.9 26.0 - 34.0 pg   MCHC 33.8 30.0 - 36.0 g/dL   RDW 11.7 11.5 - 15.5 %   Platelets 210 150 - 400 K/uL   nRBC 0.0 0.0 - 0.2 %   Neutrophils Relative % 80 %   Neutro Abs 12.2 (H) 1.7 - 7.7 K/uL   Lymphocytes Relative 7 %   Lymphs Abs 1.0 0.7 - 4.0 K/uL   Monocytes Relative 12 %   Monocytes Absolute 1.8 (H) 0.1 - 1.0 K/uL   Eosinophils Relative 0 %   Eosinophils Absolute 0.1 0.0 - 0.5 K/uL   Basophils Relative 0 %   Basophils Absolute 0.0 0.0 - 0.1 K/uL   Immature Granulocytes 1 %   Abs Immature Granulocytes 0.08 (H) 0.00 - 0.07 K/uL  Protime-INR     Status: None   Collection Time: 04/03/21  2:56 PM  Result Value Ref Range   Prothrombin Time 13.8 11.4 - 15.2 seconds   INR 1.1 0.8 - 1.2  APTT     Status: None   Collection Time: 04/03/21  2:56 PM  Result Value Ref Range   aPTT 28 24 - 36 seconds   No results found for this or any previous visit (from the past 240 hour(s)). Creatinine: Recent Labs    04/03/21 1155 04/03/21 1456  CREATININE 1.24 1.38*   CT scan personally reviewed and is detailed in the history of present illness  Impression/Assessment:  Perirectal abscess  Plan:  No obvious involvement on physical exam.  I do not think he would need penile debridement or scrotal debridement.  I am available if needed  intraoperatively.  Consider placement of Foley catheter prior to the surgery.  Marton Redwood, III 04/03/2021, 10:39 PM

## 2021-04-03 NOTE — ED Provider Notes (Signed)
Emergency Medicine Provider Triage Evaluation Note  Sean Schaefer , a 55 y.o. male  was evaluated in triage.  Pt complains of abscess to the buttocks. Hx crohns with complex abscesses requiring surgical drainage.  Review of Systems  Positive: Abscess, fever Negative: Chest pain  Physical Exam  BP (!) 177/154 (BP Location: Right Arm)   Pulse (!) 134   Temp (!) 103.1 F (39.5 C) (Oral)   Resp (!) 24   SpO2 100%  Gen:   Awake, no distress   Resp:  Normal effort  MSK:   Moves extremities without difficulty  Other:  Large indurated area to the right buttocks  Medical Decision Making  Medically screening exam initiated at 1:33 PM.  Appropriate orders placed.  Sean Schaefer was informed that the remainder of the evaluation will be completed by another provider, this initial triage assessment does not replace that evaluation, and the importance of remaining in the ED until their evaluation is complete.     Bishop Dublin 04/03/21 1337    Hayden Rasmussen, MD 04/03/21 [phone]

## 2021-04-03 NOTE — Consult Note (Addendum)
Reason for Consult/Chief Complaint:peri-rectal abscess Consultant: Tomi Bamberger, MD  Sean Schaefer is an 55 y.o. male.   HPI: 89M with UC and a history of multiple peri-rectal abscesses, most recently 01/2021 requiring operative drainage and seton placement by Dr. Dema Severin. H/o subtotal colectomy with ostomy takedown and J-pouch with post-op dx of Crohns disease. Onset of pain 9/9 with fevers and increasing size of L buttock. Has been rotating setons as directed and without difficulty.   Past Medical History:  Diagnosis Date   Crohn's disease ([place])    Diabetes mellitus without complication ([place])    Hypertension    Ulcerative colitis ([place])     Past Surgical History:  Procedure Laterality Date   COLON SURGERY     EYE SURGERY Left    FLEXIBLE SIGMOIDOSCOPY N/A 02/05/2021   Procedure: FLEXIBLE POUCHOSCOPY WITH BIOPSY;  Surgeon: Ileana Roup, MD;  [facility];  Service: General;  Laterality: N/A;   INCISION AND DRAINAGE ABSCESS N/A 02/05/2021   Procedure: INCISION AND DRAINAGE OF PERIANAL ABSCESS;  Surgeon: Ileana Roup, MD;  [facility];  Service: General;  Laterality: N/A;   INCISION AND DRAINAGE PERIRECTAL ABSCESS N/A 11/30/2020   Procedure: IRRIGATION AND DEBRIDEMENT PERIRECTAL ABSCESS;  Surgeon: Dwan Bolt, MD;  Location: [facility];  Service: General;  Laterality: N/A;   Perianal fistula repair  07/22/2010   PLACEMENT OF SETON N/A 02/05/2021   Procedure: PLACEMENT OF SETONS X2;  Surgeon: Ileana Roup, MD;  [facility];  Service: General;  Laterality: N/A;   RECTAL EXAM UNDER ANESTHESIA N/A 02/05/2021   Procedure: ANORECTAL EXAM UNDER ANESTHESIA;  Surgeon: Ileana Roup, MD;  [facility];  Service: General;  Laterality: N/A;   TESTICLE REMOVAL Left 2000    Family History  Problem Relation Age of Onset   Hypertension Mother    Cancer Mother    Hypertension Father    Diabetes Paternal Grandmother    Diabetes Paternal Uncle    Diabetes  Paternal Grandfather    Heart disease Neg Hx     Social History:  reports that he has never smoked. He has never used smokeless tobacco. He reports that he does not drink alcohol and does not use drugs.  Allergies: No Known Allergies  Medications: I have reviewed the patient's current medications.  Results for orders placed or performed during the hospital encounter of 04/03/21 (from the past 48 hour(s))  Lactic acid, plasma     Status: None   Collection Time: 04/03/21  2:40 PM  Result Value Ref Range   Lactic Acid, Venous 1.9 0.5 - 1.9 mmol/L    Comment: Performed at [facility], [place] [address], , [place] [address]  Comprehensive metabolic panel     Status: Abnormal   Collection Time: 04/03/21  2:56 PM  Result Value Ref Range   Sodium 131 (L) 135 - 145 mmol/L   Potassium 4.4 3.5 - 5.1 mmol/L   Chloride 95 (L) 98 - 111 mmol/L   CO2 21 (L) 22 - 32 mmol/L   Glucose, Bld 252 (H) 70 - 99 mg/dL    Comment: Glucose reference range applies only to samples taken after fasting for at least 8 hours.   BUN 17 6 - 20 mg/dL   Creatinine, Ser 1.38 (H) 0.61 - 1.24 mg/dL   Calcium 8.8 (L) 8.9 - 10.3 mg/dL   Total Protein 8.5 (H) 6.5 - 8.1 g/dL   Albumin 3.2 (L) 3.5 - 5.0 g/dL  AST 17 15 - 41 U/L   ALT 12 0 - 44 U/L   Alkaline Phosphatase 84 38 - 126 U/L   Total Bilirubin 2.0 (H) 0.3 - 1.2 mg/dL   GFR, Estimated >60 >60 mL/min    Comment: (NOTE) Calculated using the CKD-EPI Creatinine Equation (2021)    Anion gap 15 5 - 15    Comment: Performed at [facility] [address], [place], [place] [address]  CBC WITH DIFFERENTIAL     Status: Abnormal   Collection Time: 04/03/21  2:56 PM  Result Value Ref Range   WBC 15.2 (H) 4.0 - 10.5 K/uL   RBC 5.35 4.22 - 5.81 MIL/uL   Hemoglobin 16.0 13.0 - 17.0 g/dL   HCT 47.4 39.0 - 52.0 %   MCV 88.6 80.0 - 100.0 fL   MCH 29.9 26.0 - 34.0 pg   MCHC 33.8 30.0 - 36.0 g/dL   RDW 11.7 11.5 - 15.5 %   Platelets 210 150 - 400 K/uL    nRBC 0.0 0.0 - 0.2 %   Neutrophils Relative % 80 %   Neutro Abs 12.2 (H) 1.7 - 7.7 K/uL   Lymphocytes Relative 7 %   Lymphs Abs 1.0 0.7 - 4.0 K/uL   Monocytes Relative 12 %   Monocytes Absolute 1.8 (H) 0.1 - 1.0 K/uL   Eosinophils Relative 0 %   Eosinophils Absolute 0.1 0.0 - 0.5 K/uL   Basophils Relative 0 %   Basophils Absolute 0.0 0.0 - 0.1 K/uL   Immature Granulocytes 1 %   Abs Immature Granulocytes 0.08 (H) 0.00 - 0.07 K/uL    Comment: Performed at [facility] [address], [place], [place] [address]  Protime-INR     Status: None   Collection Time: 04/03/21  2:56 PM  Result Value Ref Range   Prothrombin Time 13.8 11.4 - 15.2 seconds   INR 1.1 0.8 - 1.2    Comment: (NOTE) INR goal varies based on device and disease states. Performed at [facility], [place] [address], [place], [place] [address]   APTT     Status: None   Collection Time: 04/03/21  2:56 PM  Result Value Ref Range   aPTT 28 24 - 36 seconds    Comment: Performed at [facility] [address] [place], [place] [address]    CT PELVIS W CONTRAST  Result Date: 04/03/2021 CLINICAL DATA:  Rectal abscess for 4 days,, previous incision and drainage for rectal abscess July 2022, history of Crohn disease EXAM: CT PELVIS WITH CONTRAST TECHNIQUE: Multidetector CT imaging of the pelvis was performed using the standard protocol following the bolus administration of intravenous contrast. CONTRAST:  35m OMNIPAQUE IOHEXOL 350 MG/ML SOLN COMPARISON:  11/26/2013 FINDINGS: Urinary Tract:  The distal ureters and bladder are unremarkable. Bowel: No bowel obstruction or ileus. Postsurgical changes are seen from subtotal colectomy and reanastomosis. There is wall thickening at the site of anastomosis within the ileal pouch which may reflect recurrent inflammatory bowel disease. There is a large abscess within the left perianal region, measuring up to 7.6 x 5.6 cm in transverse dimension and extending approximately  10.3 cm in craniocaudal extent. There is likely fistulous connection with the anal verge to the left midline. Inflammatory changes extend into the left aspect of the perineum may involve the base of the penis, with a small gas and fluid collection reference image 48/3 measuring 2.7 x 1.0 cm. A linear radiopaque structure is seen  coiled within the perineum, likely setons for treatment of the known fistula. Vascular/Lymphatic: Mild atherosclerosis. No pathologically enlarged lymph nodes. Reproductive: Mild prostate enlargement unchanged since prior study. Other: No free intraperitoneal fluid or free gas. No abdominal wall hernia. Musculoskeletal: There are no acute or destructive bony lesions. Reconstructed images demonstrate no additional findings. IMPRESSION: 1. 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 Small extension of this abscess at of the perineum at the base of the penis as above. 2. Coiled radiopaque structure within the perineum likely reflects setons for treatment of known fistula. 3. Subtotal colectomy, with bowel wall thickening at the ileal pouch consistent with inflammatory bowel disease. No evidence of obstruction. 4.  Aortic Atherosclerosis (ICD10-I70.0). Electronically Signed   By: Randa Ngo M.D.   On: 04/03/2021 19:40    ROS 10 point review of systems is negative except as listed above in HPI.   Physical Exam Blood pressure 120/87, pulse (!) 122, temperature 99 F (37.2 C), resp. rate 18, SpO2 100 %. Constitutional: well-developed, well-nourished HEENT: pupils equal, round, reactive to light, 11m b/l, moist conjunctiva, external inspection of ears and nose normal, hearing intact Oropharynx: normal oropharyngeal mucosa, normal dentition Neck: no thyromegaly, trachea midline, no midline cervical tenderness to palpation Chest: breath sounds equal bilaterally, normal respiratory effort, no midline or lateral chest wall  tenderness to palpation/deformity Abdomen: soft, NT, no bruising, no hepatosplenomegaly GU:  no blood at urethral meatus, single testicle on the R   Back: no wounds, no thoracic/lumbar spine tenderness to palpation, no thoracic/lumbar spine stepoffs Rectal: deferred 2/2 pain, extensive induration, swelling, and tenderness of L buttock Extremities: 2+ radial and pedal pulses bilaterally, intact motor and sensation bilateral UE and LE, no peripheral edema MSK: unable to assess gait/station, no clubbing/cyanosis of fingers/toes, normal ROM of all four extremities Skin: warm, dry, no rashes Psych: normal memory, normal mood/affect    Assessment/Plan: 73M with recurrent peri-rectal abscess. Concern for separate gas-containing fluid collection at the base of the penis and unclear if these two collections communicate with each other. Will plan for I&D tonight. Discussed with Dr. BGloriann Loanof Urology who reviewed the CT imaging and examined the patient in person. He will be available to assist intra-operatively if needed. Informed consent was obtained after detailed explanation of risks, including bleeding, infection, recurrence, injury to genitourinary structures, need for additional debridement. All questions answered to the patient's satisfaction.   AJesusita Oka MD General and [facility]

## 2021-04-03 NOTE — ED Notes (Signed)
Pt transported to CT via stretcher at this time.  

## 2021-04-03 NOTE — Anesthesia Preprocedure Evaluation (Addendum)
Anesthesia Evaluation  Patient identified by MRN, date of birth, ID band Patient awake    Reviewed: Allergy & Precautions, NPO status , Patient's Chart, lab work & pertinent test results  Airway Mallampati: II  TM Distance: >3 FB Neck ROM: Full    Dental  (+) Dental Advisory Given   Pulmonary neg pulmonary ROS,    Pulmonary exam normal breath sounds clear to auscultation       Cardiovascular hypertension, Normal cardiovascular exam Rhythm:Regular Rate:Normal  ECG: ST   Neuro/Psych negative neurological ROS  negative psych ROS   GI/Hepatic Neg liver ROS, PUD,   Endo/Other  diabetes, Insulin Dependent  Renal/GU negative Renal ROS     Musculoskeletal negative musculoskeletal ROS (+)   Abdominal (+) + obese,   Peds  Hematology HLD   Anesthesia Other Findings PERIANAL CROHN'S DISEASE  Reproductive/Obstetrics                            Anesthesia Physical  Anesthesia Plan  ASA: 3 and emergent  Anesthesia Plan: General   Post-op Pain Management:    Induction: Intravenous  PONV Risk Score and Plan: 3 and Ondansetron, Dexamethasone, Midazolam and Treatment may vary due to age or medical condition  Airway Management Planned: Oral ETT  Additional Equipment: None  Intra-op Plan:   Post-operative Plan: Extubation in OR  Informed Consent: I have reviewed the patients History and Physical, chart, labs and discussed the procedure including the risks, benefits and alternatives for the proposed anesthesia with the patient or authorized representative who has indicated his/her understanding and acceptance.     Dental advisory given  Plan Discussed with: CRNA  Anesthesia Plan Comments: (Reviewed PAT note 01/30/2021, Konrad Felix, PA-C)       Anesthesia Quick Evaluation

## 2021-04-03 NOTE — ED Provider Notes (Signed)
[place] EMERGENCY DEPARTMENT Provider Note   CSN: [id number] Arrival date & time: 04/03/21  1247     History Chief Complaint  Patient presents with   Abscess    Sean Schaefer is a 55 y.o. male.  Pt is a 55 yo male with PMH Crohn's Disease and recurrent abscess on buttocks presenting for abscess. Pt admits to pain and swelling on buttocks. Sent in after pt was being seen today at GI specialist for concerns for infection and need for OR I/D. Previous I/D completed by general surgeon Dr. Dema Severin on 02/05/2021.  Pt seen in triage and flagged for sepsis for fever and elevated heart rate.   The history is provided by the patient.  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   Crohn's disease ([place]) 12/18/2013    Past Surgical History:  Procedure Laterality Date   COLON SURGERY     EYE SURGERY Left    FLEXIBLE SIGMOIDOSCOPY N/A 02/05/2021   Procedure: FLEXIBLE POUCHOSCOPY WITH BIOPSY;  Surgeon: Ileana Roup, MD;  Location[facility];  Service: General;  Laterality: N/A;   INCISION AND DRAINAGE ABSCESS N/A 02/05/2021   Procedure: INCISION AND DRAINAGE OF PERIANAL ABSCESS;  Surgeon: Ileana Roup, MD;  Location: [facility];  Service: General;  Laterality: N/A;   INCISION AND DRAINAGE PERIRECTAL ABSCESS N/A 11/30/2020   Procedure: IRRIGATION AND DEBRIDEMENT PERIRECTAL ABSCESS;  Surgeon: Dwan Bolt, MD;  Location: [place];  Service: General;  Laterality: N/A;   Perianal fistula repair  07/22/2010   PLACEMENT OF SETON N/A 02/05/2021   Procedure: PLACEMENT OF  SETONS X2;  Surgeon: Ileana Roup, MD;  [facility];  Service: General;  Laterality: N/A;   RECTAL EXAM UNDER ANESTHESIA N/A 02/05/2021   Procedure: ANORECTAL EXAM UNDER ANESTHESIA;  Surgeon: Ileana Roup, MD;  [facility];  Service: General;  Laterality: N/A;   TESTICLE REMOVAL Left 2000       Family History  Problem Relation Age of Onset   Hypertension Mother    Cancer Mother    Hypertension Father    Diabetes Paternal Grandmother    Diabetes Paternal Uncle    Diabetes Paternal Grandfather    Heart disease Neg Hx     Social History   Tobacco Use   Smoking status: Never   Smokeless tobacco: Never  Vaping Use   Vaping Use: Never used  Substance Use Topics   Alcohol use: No   Drug use: No    Home Medications Prior to Admission medications   Medication Sig Start Date End Date Taking? Authorizing Provider  HUMALOG KWIKPEN 100 UNIT/ML KwikPen INJECT 8 UNITS BEFORE EACH MEAL OR AS DIRECTED Patient taking differently: Inject 10 Units into the skin 3 (three) times daily with meals. 10/19/19  Yes Elayne Snare, MD  insulin degludec (TRESIBA) 100 UNIT/ML FlexTouch Pen Inject 40 units subcutaneously daily at bedtime Patient taking differently: Inject 38 Units into the skin at bedtime. 01/31/21  Yes   rosuvastatin (CRESTOR) 10 MG tablet Take 1 tablet by mouth  once daily 02/02/21  Yes   blood glucose meter kit and supplies Dispense based on patient and insurance preference. Use up to four times daily as directed. (FOR ICD-10 E10.9, E11.9). 07/08/18   Wendie Agreste, MD  Continuous Blood Gluc Receiver (FREESTYLE LIBRE 14 DAY READER) DEVI Use as directed 01/31/21     Continuous Blood Gluc Sensor (FREESTYLE LIBRE 14 DAY SENSOR) MISC Apply 1 sensor to the back of the upper arm every 14 days 01/31/21     glucose blood (FREESTYLE PRECISION NEO TEST) test strip 1 each by Other route daily. Use Freestyle Precision Neo test strips as instructed to check blood sugar once  daily. 03/05/19   Elayne Snare, MD  TRESIBA FLEXTOUCH 100 UNIT/ML FlexTouch Pen INJECT 38 UNITS UNDER THE SKIN ONCE A DAY Patient not taking: No sig reported 10/19/19   Elayne Snare, MD    Allergies    Patient has no known allergies.  Review of Systems   Review of Systems  Constitutional:  Positive for chills and fever.  HENT:  Negative for ear pain and sore throat.   Eyes:  Negative for pain and visual disturbance.  Respiratory:  Negative for cough and shortness of breath.   Cardiovascular:  Negative for chest pain and palpitations.  Gastrointestinal:  Negative for abdominal pain and vomiting.  Genitourinary:  Negative for dysuria and hematuria.  Musculoskeletal:  Negative for arthralgias and back pain.  Skin:  Positive for wound. Negative for color change and rash.  Neurological:  Negative for seizures and syncope.  All other systems reviewed and are negative.  Physical Exam Updated Vital Signs BP 120/87   Pulse (!) 122   Temp 99 F (37.2 C)   Resp 18   SpO2 100%   Physical Exam Vitals and nursing note reviewed. Exam conducted with a chaperone present.  Constitutional:      Appearance: He is well-developed.  HENT:     Head: Normocephalic and atraumatic.  Eyes:     Conjunctiva/sclera: Conjunctivae normal.  Cardiovascular:     Rate and Rhythm: Normal rate and regular rhythm.     Heart sounds: No murmur heard. Pulmonary:     Effort: Pulmonary effort is normal. No respiratory distress.     Breath sounds: Normal breath sounds.  Abdominal:     Palpations: Abdomen is soft.     Tenderness: There is no abdominal tenderness.  Genitourinary:   Musculoskeletal:     Cervical back: Neck supple.  Skin:    General: Skin is warm and dry.  Neurological:     Mental Status: He is alert.    ED Results / Procedures / Treatments   Labs (all labs ordered are listed, but only abnormal results are displayed) Labs Reviewed  COMPREHENSIVE METABOLIC PANEL - Abnormal; Notable for the  following components:      Result Value   Sodium 131 (*)    Chloride 95 (*)    CO2 21 (*)    Glucose, Bld 252 (*)    Creatinine, Ser 1.38 (*)    Calcium 8.8 (*)    Total Protein 8.5 (*)    Albumin 3.2 (*)    Total Bilirubin 2.0 (*)    All other components within normal limits  CBC WITH DIFFERENTIAL/PLATELET - Abnormal; Notable for the following components:   WBC 15.2 (*)    Neutro Abs 12.2 (*)    Monocytes Absolute 1.8 (*)    Abs Immature Granulocytes 0.08 (*)    All other components within  normal limits  CULTURE, BLOOD (ROUTINE X 2)  CULTURE, BLOOD (ROUTINE X 2)  RESP PANEL BY RT-PCR (FLU A&B, COVID) ARPGX2  LACTIC ACID, PLASMA  PROTIME-INR  APTT  URINALYSIS, ROUTINE W REFLEX MICROSCOPIC    EKG None  Radiology No results found.  Procedures .Critical Care Performed by: Lianne Cure, DO Authorized by: Lianne Cure, DO   Critical care provider statement:    Critical care time (minutes):  45   Critical care was time spent personally by me on the following activities:  Discussions with consultants, evaluation of patient's response to treatment, examination of patient, ordering and performing treatments and interventions, ordering and review of laboratory studies, ordering and review of radiographic studies, pulse oximetry, re-evaluation of patient's condition, obtaining history from patient or surrogate and review of old charts (large abscess with perianal fistula resulting in sepsis)   Care discussed with comment:  General surgery and admitting provider   Medications Ordered in ED Medications  acetaminophen (TYLENOL) tablet 650 mg (650 mg Oral Given 04/03/21 1351)  iohexol (OMNIPAQUE) 350 MG/ML injection 80 mL (80 mLs Intravenous Contrast Given 04/03/21 1859)    ED Course  I have reviewed the triage vital signs and the nursing notes.  Pertinent labs & imaging results that were available during my care of the patient were reviewed by me and considered in my medical  decision making (see chart for details).    MDM Rules/Calculators/A&P   7:18 PM  55 yo male with PMH Crohn's Disease and recurrent abscess on buttocks presenting for abscess.  Sent in afer eval by * for concerns for infection and need for OR I/D. Pt seen in triage and flagged for sepsis for fever and elevated heart rate.   Currently pt is Aox3, no acute distress, febrile, and tachycardic. Physical exam demonstrates large region of induration and swelling to left buttock.   IV fluids given, tylenol for fever, blood cx sent, vancomycin and cefepime started.   8:20 PM CT abdomen demonstrates: 1. 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 Small extension of this abscess at of the perineum at the base of the penis as above. 2. Coiled radiopaque structure within the perineum likely reflects setons for treatment of known fistula. 3. Subtotal colectomy, with bowel wall thickening at the ileal pouch consistent with inflammatory bowel disease. No evidence of obstruction. 4.  Aortic Atherosclerosis (ICD10-I70.0).   I spoke with general surgery who agree to be consulted. I spoke with hospitalist who agrees to admit patient. Pt agreeable to plan.       Final Clinical Impression(s) / ED Diagnoses Final diagnoses:  Sepsis, due to unspecified organism, unspecified whether acute organ dysfunction present [facility])  Crohn's disease with complication, unspecified gastrointestinal tract location [facility])  Abscess-left buttock  Perianal fistula    Rx / DC Orders ED Discharge Orders     None        Lianne Cure, DO 71/24/58 2213

## 2021-04-03 NOTE — Progress Notes (Signed)
Pharmacy Antibiotic Note  Sean Schaefer is a 55 y.o. male admitted on 04/03/2021 with sepsis,recurrent abscess. Of note, patient has history of recurrent abscess with recent I&D of perianal abscess 02/05/21. Pharmacy has been consulted for cefepime and vancomycin dosing.  WBC 15.2, Tmax 103.49F SCr 1.38 - 1.24 on admit, 0.9-1.1 is baseline  Plan: Cefepime 2gm every 8 hours Vancomycin 2500 mg IV x1 Variable vancomycin dosing based on renal function Monitor renal function, CBC, cultures/sensitivities, LOT and de-escalate as able Vancomycin levels as needed  Temp (24hrs), Avg:101.1 F (38.4 C), Min:99 F (37.2 C), Max:103.1 F (39.5 C)  Recent Labs  Lab 04/03/21 1155 04/03/21 1440 04/03/21 1456  WBC 13.8*  --  15.2*  CREATININE 1.24  --  1.38*  LATICACIDVEN  --  1.9  --     Estimated Creatinine Clearance: 73.8 mL/min (A) (by C-G formula based on SCr of 1.38 mg/dL (H)).    No Known Allergies  Antimicrobials this admission: Vancomycin 9/13 >> Cefepime 9/13 >>  Dose adjustments this admission: none  Microbiology results: 9/13 BCx: pending  Thank you for allowing pharmacy to be a part of this patient's care.  Laurey Arrow, PharmD PGY1 Pharmacy Resident 04/03/2021  7:32 PM  Please check [email or web address] for unit-specific pharmacy phone numbers.

## 2021-04-03 NOTE — ED Triage Notes (Signed)
Pt states he has an abscess on his rectum that started on Thursday. Pt has Crohn's and had surgery on an abscess his rectum in July. Pt was sent here for further investigation.

## 2021-04-03 NOTE — Progress Notes (Signed)
[facility] Consult Note:  History: Sean Schaefer 04/03/2021  Referring provider: Nadeen Landau, MD.  [facility] surgery)  Reason for consult/chief complaint: Crohn's Disease (Perianal abscess surgery in July, still have the drains)   Subjective  HPI: This is a very pleasant 55 year old man accompanied by his wife and referred by his surgeon for concern of perianal Crohn's disease. He was diagnosed with ulcerative colitis in the 1980s and underwent total proctocolectomy with end ileostomy.  He was then seen at the [facility] in the late 80s and underwent J-pouch creation.  Some point afterward, he had recurrent problems with perianal abscess requiring surgery here and perhaps at other institutions.  In the last several months he has had abscess drainage and seton placement by [facility] physicians, most recently in July by Dr. Dema Severin. Sean Schaefer was seen by Dr. Mar Daring man in 2015, he recalls having what sounds like an ileoscopy, then was put on Humira.  He recalls taking it for about 8 months, says it never made him better, also felt he had some side effects (cannot recall exactly what they were), he just "felt bad all the time", and stop the medicine.  He has been on no Crohn's therapy since then. He has multiple BMs per day, which has been his pattern for many years since the original surgery, denies bleeding.  He felt that he was doing well after the most recent surgery until several days ago, when he developed worsening painful swelling on the left side of the buttock with some drainage from an opening in that area.  The pain has been escalating in the last few days, is unable to sit and has slept poorly as a result of the pain and swelling.   ROS:  Review of Systems  Constitutional:  Negative for appetite change and unexpected weight change.  HENT:  Negative for mouth sores and voice change.   Eyes:  Negative for pain and redness.  Respiratory:  Negative for  cough and shortness of breath.   Cardiovascular:  Negative for chest pain and palpitations.  Genitourinary:  Negative for dysuria and hematuria.  Musculoskeletal:  Negative for arthralgias and myalgias.  Skin:  Negative for pallor and rash.  Neurological:  Negative for weakness and headaches.  Hematological:  Negative for adenopathy.  Denies fever  Past Medical History: Past Medical History:  Diagnosis Date   Crohn's disease ([place])    Diabetes mellitus without complication ([place])    Hypertension    Ulcerative colitis ([place])      Past Surgical History: Past Surgical History:  Procedure Laterality Date   COLON SURGERY     EYE SURGERY Left    FLEXIBLE SIGMOIDOSCOPY N/A 02/05/2021   Procedure: FLEXIBLE POUCHOSCOPY WITH BIOPSY;  Surgeon: Ileana Roup, MD;  [facility];  Service: General;  Laterality: N/A;   INCISION AND DRAINAGE ABSCESS N/A 02/05/2021   Procedure: INCISION AND DRAINAGE OF PERIANAL ABSCESS;  Surgeon: Ileana Roup, MD;  [facility];  Service: General;  Laterality: N/A;   INCISION AND DRAINAGE PERIRECTAL ABSCESS N/A 11/30/2020   Procedure: IRRIGATION AND DEBRIDEMENT PERIRECTAL ABSCESS;  Surgeon: Dwan Bolt, MD;  Location: [place];  Service: General;  Laterality: N/A;   Perianal fistula repair  07/22/2010   PLACEMENT OF SETON N/A 02/05/2021   Procedure: PLACEMENT OF SETONS X2;  Surgeon: Ileana Roup, MD;  [facility];  Service: General;  Laterality: N/A;   RECTAL EXAM UNDER ANESTHESIA N/A 02/05/2021   Procedure:  ANORECTAL EXAM UNDER ANESTHESIA;  Surgeon: Ileana Roup, MD;  [facility];  Service: General;  Laterality: N/A;   TESTICLE REMOVAL Left 2000     Family History: Family History  Problem Relation Age of Onset   Hypertension Mother    Cancer Mother    Hypertension Father    Diabetes Paternal Grandmother    Diabetes Paternal Uncle    Diabetes Paternal Grandfather    Heart disease Neg Hx     Social  History: Social History   Socioeconomic History   Marital status: Married    Spouse name: Not on file   Number of children: Not on file   Years of education: Not on file   Highest education level: Not on file  Occupational History   Not on file  Tobacco Use   Smoking status: Never   Smokeless tobacco: Never  Vaping Use   Vaping Use: Never used  Substance and Sexual Activity   Alcohol use: No   Drug use: No   Sexual activity: Yes  Other Topics Concern   Not on file  Social History Narrative   Not on file   Social Determinants of Health   Financial Resource Strain: Not on file  Food Insecurity: Not on file  Transportation Needs: Not on file  Physical Activity: Not on file  Stress: Not on file  Social Connections: Not on file    Allergies: No Known Allergies  Outpatient Meds: Current Outpatient Medications  Medication Sig Dispense Refill   blood glucose meter kit and supplies Dispense based on patient and insurance preference. Use up to four times daily as directed. (FOR ICD-10 E10.9, E11.9). 1 each 0   Continuous Blood Gluc Receiver (FREESTYLE LIBRE 14 DAY READER) DEVI Use as directed 1 each 2   Continuous Blood Gluc Sensor (FREESTYLE LIBRE 14 DAY SENSOR) MISC Apply 1 sensor to the back of the upper arm every 14 days 3 each 3   glucose blood (FREESTYLE PRECISION NEO TEST) test strip 1 each by Other route daily. Use Freestyle Precision Neo test strips as instructed to check blood sugar once daily. 50 each 3   HUMALOG KWIKPEN 100 UNIT/ML KwikPen INJECT 8 UNITS BEFORE EACH MEAL OR AS DIRECTED (Patient taking differently: Inject 10 Units into the skin 3 (three) times daily with meals.) 9 mL 1   insulin degludec (TRESIBA) 100 UNIT/ML FlexTouch Pen Inject 40 units subcutaneously daily at bedtime 30 mL 1   rosuvastatin (CRESTOR) 10 MG tablet Take 1 tablet by mouth once daily 90 tablet 3   TRESIBA FLEXTOUCH 100 UNIT/ML FlexTouch Pen INJECT 38 UNITS UNDER THE SKIN ONCE A DAY  (Patient taking differently: Inject 38 Units into the skin at bedtime.) 12 mL 3   Current Facility-Administered Medications  Medication Dose Route Frequency Provider Last Rate Last Admin   Insulin Pen Needle (NOVOFINE) 10 each  1 packet Subcutaneous QHS Tereasa Coop, PA-C          ___________________________________________________________________ Objective   Exam:  BP 124/74   Pulse (!) 136   Ht 5' 10"  (1.778 m)   Wt 223 lb 3.2 oz (101.2 kg)   BMI 32.03 kg/m  Wt Readings from Last 3 Encounters:  04/03/21 223 lb 3.2 oz (101.2 kg)  02/05/21 220 lb 14.4 oz (100.2 kg)  01/30/21 221 lb (100.2 kg)   Patient's pulse is persistently about 120 General: He is visibly uncomfortable, even when standing.  He is in more severe pain trying to sit.  Nontoxic Eyes: sclera anicteric, no redness ENT: oral mucosa moist without lesions, no cervical or supraclavicular lymphadenopathy CV: RRR without murmur, S1/S2, no JVD, no peripheral edema Resp: clear to auscultation bilaterally, normal RR and effort noted GI: soft, no tenderness, with active bowel sounds. No guarding or palpable organomegaly noted. Skin; warm and dry, no rash or jaundice noted Neuro: awake, alert and oriented x 3. Normal gross motor function and fluent speech Perianal exam reveals 2 setons in place, also marked tenderness, swelling with induration over much of the medial left buttock with an opening several centimeters from the anal opening that is draining a little clear material. Labs:  No recent labs  Surgical reports reviewed.  Assessment: Encounter Diagnoses  Name Primary?   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 It is difficult to say why he did not have improvement with Humira (possible mechanism of action, dose, development of antibodies,  lack of concomitant azathioprine) While he will need biologic therapy (perhaps Remicade given previous reported experience with Humira),, however the more immediate need is his perianal/buttock abscess.  He is tachycardic, at least from the pain and perhaps from infection.  This needs immediate surgical attention and antibiotics.  I left a message for Dr. Dema Severin, but he is most likely tied up in the operating room right now.  Even if he or one of his partners could see Mr. Linden in the clinic today, I suspect they are not likely able to attend to this in the clinic setting.  Plan:  Connar will go to our lab for a CBC, CMP, hepatitis a and B serologies, TPMT enzyme activity and TB QuantiFERON gold  He will then proceed to the [facility] emergency department.  We will speak with the triage team to let them know of his arrival, and I will communicate with Dr. Dema Severin or one of his covering surgeons about this so they can expect to evaluate this patient in the ED.  Thank you for the courtesy of this consult.  Please call me with any questions or concerns.  [name]  CC: Referring provider noted above

## 2021-04-04 ENCOUNTER — Encounter (HOSPITAL_COMMUNITY): Payer: Self-pay | Admitting: Surgery

## 2021-04-04 DIAGNOSIS — L0231 Cutaneous abscess of buttock: Secondary | ICD-10-CM | POA: Diagnosis present

## 2021-04-04 DIAGNOSIS — K50913 Crohn's disease, unspecified, with fistula: Secondary | ICD-10-CM | POA: Diagnosis present

## 2021-04-04 DIAGNOSIS — E119 Type 2 diabetes mellitus without complications: Secondary | ICD-10-CM | POA: Diagnosis present

## 2021-04-04 DIAGNOSIS — K603 Anal fistula: Secondary | ICD-10-CM | POA: Diagnosis present

## 2021-04-04 DIAGNOSIS — K611 Rectal abscess: Secondary | ICD-10-CM | POA: Diagnosis present

## 2021-04-04 DIAGNOSIS — Z8249 Family history of ischemic heart disease and other diseases of the circulatory system: Secondary | ICD-10-CM | POA: Diagnosis not present

## 2021-04-04 DIAGNOSIS — Z20822 Contact with and (suspected) exposure to covid-19: Secondary | ICD-10-CM | POA: Diagnosis present

## 2021-04-04 DIAGNOSIS — I10 Essential (primary) hypertension: Secondary | ICD-10-CM | POA: Diagnosis present

## 2021-04-04 DIAGNOSIS — Z9079 Acquired absence of other genital organ(s): Secondary | ICD-10-CM | POA: Diagnosis not present

## 2021-04-04 DIAGNOSIS — A419 Sepsis, unspecified organism: Secondary | ICD-10-CM | POA: Diagnosis present

## 2021-04-04 DIAGNOSIS — Z794 Long term (current) use of insulin: Secondary | ICD-10-CM | POA: Diagnosis not present

## 2021-04-04 DIAGNOSIS — Z833 Family history of diabetes mellitus: Secondary | ICD-10-CM | POA: Diagnosis not present

## 2021-04-04 LAB — BASIC METABOLIC PANEL
Anion gap: 12 (ref 5–15)
BUN: 20 mg/dL (ref 6–20)
CO2: 19 mmol/L — ABNORMAL LOW (ref 22–32)
Calcium: 8.1 mg/dL — ABNORMAL LOW (ref 8.9–10.3)
Chloride: 98 mmol/L (ref 98–111)
Creatinine, Ser: 1.26 mg/dL — ABNORMAL HIGH (ref 0.61–1.24)
GFR, Estimated: >60 mL/min (ref >60)
Glucose, Bld: 290 mg/dL — ABNORMAL HIGH (ref 70–99)
Potassium: 4.4 mmol/L (ref 3.5–5.1)
Sodium: 129 mmol/L — ABNORMAL LOW (ref 135–145)

## 2021-04-04 LAB — CBC
HCT: 38.4 % — ABNORMAL LOW (ref 39.0–52.0)
Hemoglobin: 13.3 g/dL (ref 13.0–17.0)
MCH: 29.8 pg (ref 26.0–34.0)
MCHC: 34.6 g/dL (ref 30.0–36.0)
MCV: 86.1 fL (ref 80.0–100.0)
Platelets: 180 10*3/uL (ref 150–400)
RBC: 4.46 MIL/uL (ref 4.22–5.81)
RDW: 11.8 % (ref 11.5–15.5)
WBC: 14.3 10*3/uL — ABNORMAL HIGH (ref 4.0–10.5)
nRBC: 0 % (ref 0.0–0.2)

## 2021-04-04 LAB — GLUCOSE, CAPILLARY
Glucose-Capillary: 210 mg/dL — ABNORMAL HIGH (ref 70–99)
Glucose-Capillary: 216 mg/dL — ABNORMAL HIGH (ref 70–99)
Glucose-Capillary: 270 mg/dL — ABNORMAL HIGH (ref 70–99)
Glucose-Capillary: 286 mg/dL — ABNORMAL HIGH (ref 70–99)
Glucose-Capillary: 384 mg/dL — ABNORMAL HIGH (ref 70–99)
Glucose-Capillary: 408 mg/dL — ABNORMAL HIGH (ref 70–99)

## 2021-04-04 MED ORDER — INSULIN ASPART 100 UNIT/ML IJ SOLN
10.0000 [IU] | Freq: Three times a day (TID) | INTRAMUSCULAR | Status: DC
  Administered 2021-04-04 – 2021-04-05 (×4): 10 [IU] via SUBCUTANEOUS

## 2021-04-04 MED ORDER — SODIUM CHLORIDE 1 G PO TABS
1.0000 g | ORAL_TABLET | Freq: Three times a day (TID) | ORAL | Status: DC
  Administered 2021-04-04 – 2021-04-05 (×4): 1 g via ORAL
  Filled 2021-04-04 (×6): qty 1

## 2021-04-04 MED ORDER — MORPHINE SULFATE (PF) 2 MG/ML IV SOLN
2.0000 mg | INTRAVENOUS | Status: DC | PRN
  Administered 2021-04-04 (×2): 2 mg via INTRAVENOUS
  Filled 2021-04-04 (×2): qty 1

## 2021-04-04 MED ORDER — ONDANSETRON HCL 4 MG/2ML IJ SOLN
INTRAMUSCULAR | Status: AC
  Filled 2021-04-04: qty 2

## 2021-04-04 MED ORDER — MEPERIDINE HCL 25 MG/ML IJ SOLN: 6.2500 mg | INTRAMUSCULAR | Status: DC | PRN

## 2021-04-04 MED ORDER — LACTATED RINGERS IV SOLN: INTRAVENOUS | Status: DC | PRN

## 2021-04-04 MED ORDER — DEXAMETHASONE SODIUM PHOSPHATE 10 MG/ML IJ SOLN
INTRAMUSCULAR | Status: AC
  Filled 2021-04-04: qty 1

## 2021-04-04 MED ORDER — HYDROMORPHONE HCL 1 MG/ML IJ SOLN
0.2500 mg | INTRAMUSCULAR | Status: DC | PRN
  Administered 2021-04-04: 0.5 mg via INTRAVENOUS

## 2021-04-04 MED ORDER — LIDOCAINE 2% (20 MG/ML) 5 ML SYRINGE
INTRAMUSCULAR | Status: AC
  Filled 2021-04-04: qty 5

## 2021-04-04 MED ORDER — DEXAMETHASONE SODIUM PHOSPHATE 10 MG/ML IJ SOLN
INTRAMUSCULAR | Status: DC | PRN
  Administered 2021-04-04: 5 mg via INTRAVENOUS

## 2021-04-04 MED ORDER — SUGAMMADEX SODIUM 200 MG/2ML IV SOLN
INTRAVENOUS | Status: DC | PRN
  Administered 2021-04-04: 300 mg via INTRAVENOUS

## 2021-04-04 MED ORDER — INSULIN ASPART 100 UNIT/ML IJ SOLN
0.0000 [IU] | Freq: Three times a day (TID) | INTRAMUSCULAR | Status: DC
  Administered 2021-04-04: 15 [IU] via SUBCUTANEOUS
  Administered 2021-04-04: 8 [IU] via SUBCUTANEOUS
  Administered 2021-04-05 (×2): 5 [IU] via SUBCUTANEOUS

## 2021-04-04 MED ORDER — STERILE WATER FOR IRRIGATION IR SOLN
Status: DC | PRN
  Administered 2021-04-03: 1000 mL

## 2021-04-04 MED ORDER — SODIUM CHLORIDE 0.9 % IV SOLN
2.0000 g | Freq: Three times a day (TID) | INTRAVENOUS | Status: DC
  Administered 2021-04-04 – 2021-04-05 (×5): 2 g via INTRAVENOUS
  Filled 2021-04-04 (×6): qty 2

## 2021-04-04 MED ORDER — PHENYLEPHRINE 40 MCG/ML (10ML) SYRINGE FOR IV PUSH (FOR BLOOD PRESSURE SUPPORT)
PREFILLED_SYRINGE | INTRAVENOUS | Status: AC
  Filled 2021-04-04: qty 10

## 2021-04-04 MED ORDER — 0.9 % SODIUM CHLORIDE (POUR BTL) OPTIME
TOPICAL | Status: DC | PRN
  Administered 2021-04-03: 1000 mL

## 2021-04-04 MED ORDER — HYDROMORPHONE HCL 1 MG/ML IJ SOLN
INTRAMUSCULAR | Status: AC
  Filled 2021-04-04: qty 1

## 2021-04-04 MED ORDER — INSULIN GLARGINE-YFGN 100 UNIT/ML ~~LOC~~ SOLN
38.0000 [IU] | Freq: Every day | SUBCUTANEOUS | Status: DC
  Administered 2021-04-04: 38 [IU] via SUBCUTANEOUS
  Filled 2021-04-04 (×3): qty 0.38

## 2021-04-04 MED ORDER — ROCURONIUM BROMIDE 10 MG/ML (PF) SYRINGE
PREFILLED_SYRINGE | INTRAVENOUS | Status: AC
  Filled 2021-04-04: qty 10

## 2021-04-04 MED ORDER — CHLORHEXIDINE GLUCONATE CLOTH 2 % EX PADS
6.0000 | MEDICATED_PAD | Freq: Every day | CUTANEOUS | Status: DC
  Administered 2021-04-04 – 2021-04-05 (×2): 6 via TOPICAL

## 2021-04-04 MED ORDER — PROMETHAZINE HCL 25 MG/ML IJ SOLN: 6.2500 mg | INTRAMUSCULAR | Status: DC | PRN

## 2021-04-04 MED ORDER — POLYETHYLENE GLYCOL 3350 17 G PO PACK: 17.0000 g | PACK | Freq: Every day | ORAL | Status: DC | PRN

## 2021-04-04 MED ORDER — VANCOMYCIN HCL 750 MG/150ML IV SOLN
750.0000 mg | Freq: Two times a day (BID) | INTRAVENOUS | Status: DC
  Administered 2021-04-04 – 2021-04-05 (×3): 750 mg via INTRAVENOUS
  Filled 2021-04-04 (×5): qty 150

## 2021-04-04 MED ORDER — INSULIN ASPART 100 UNIT/ML IJ SOLN
0.0000 [IU] | Freq: Every day | INTRAMUSCULAR | Status: DC
  Administered 2021-04-04: 3 [IU] via SUBCUTANEOUS

## 2021-04-04 MED ORDER — ONDANSETRON HCL 4 MG/2ML IJ SOLN
INTRAMUSCULAR | Status: DC | PRN
  Administered 2021-04-04: 4 mg via INTRAVENOUS

## 2021-04-04 NOTE — Transfer of Care (Signed)
Immediate Anesthesia Transfer of Care Note  Patient: Sean Schaefer  Procedure(s) Performed: IRRIGATION AND DEBRIDEMENT PERIRECTAL ABSCESS  Patient Location: PACU  Anesthesia Type:General  Level of Consciousness: awake, alert  and oriented  Airway & Oxygen Therapy: Patient Spontanous Breathing and Patient connected to nasal cannula oxygen  Post-op Assessment: Report given to RN and Post -op Vital signs reviewed and stable  Post vital signs: Reviewed and stable  Last Vitals:  Vitals Value Taken Time  BP 133/71 04/04/21 0044  Temp    Pulse 109 04/04/21 0048  Resp 13 04/04/21 0048  SpO2 99 % 04/04/21 0048  Vitals shown include unvalidated device data.  Last Pain:  Vitals:   04/03/21 1329  TempSrc:   PainSc: 10-Worst pain ever         Complications: No notable events documented.

## 2021-04-04 NOTE — Op Note (Signed)
   Operative Note   Date: 04/04/2021  Procedure: incision and drainage of left peri-rectal/gluteal abscess  Pre-op diagnosis: left peri-rectal/gluteal abscess Post-op diagnosis: same  Indication and clinical history: The patient is a 55 y.o. year old male with left peri-rectal/gluteal abscess     Surgeon: Jesusita Oka, MD  Anesthesiologist: Lissa Hoard, MD Anesthesia: General  Findings:  Specimen: Floy Sabina and anaerobic cultures EBL: <10cc Drains/Implants: penrose drain, kerlix  Disposition: PACU - hemodynamically stable.  Description of procedure: The patient was positioned supine on the operating room table. General anesthetic induction and intubation were uneventful. Foley catheter insertion was performed and was atraumatic. The patient was repositioned to lithotomy. Time-out was performed verifying correct patient, procedure, signature of informed consent, and administration of pre-operative antibiotics. The perineum and buttocks were prepped and draped in the usual sterile fashion.  An incision was made overlying the most prominent area of induration and deepened until the abscess cavity was reached. Copious purulent fluid was suctioned and aerobic and anaerobic cultures of the fluid obtained. Hemostasis was achieved using electrocautery. The wound was adjacent to a nearby vessel loop acting as a seton, so a penrose drain was placed into the incision exiting at the pre-existing seton site. The cavity was packed with kerlix.  Sterile dressings were applied. All sponge and instrument counts were correct at the conclusion of the procedure. The patient was awakened from anesthesia, extubated uneventfully, and transported to the PACU in good condition. There were no complications.   Upon entering the abdomen (organ space), I encountered an abscess in the left peri-rectal/gluteal tissues .  CASE DATA:  Type of patient?: DOW CASE (Surgical Hospitalist [facility] Inpatient)  Status of Case?  URGENT Add On  Infection Present At Time Of Surgery (PATOS)?  ABSCESS in the left peri-rectal/gluteal tissues    Jesusita Oka, MD General and [place] [facility]

## 2021-04-04 NOTE — Progress Notes (Signed)
[facility] Progress Note  1 Day Post-Op  Subjective: CC-  Pain well controlled after surgery.  Objective: Vital signs in last 24 hours: Temp:  [98 F (36.7 C)-103.1 F (39.5 C)] 98.4 F (36.9 C) (09/14 0517) Pulse Rate:  [96-136] 99 (09/14 0517) Resp:  [14-24] 17 (09/14 0517) BP: (120-177)/(69-154) 144/81 (09/14 0517) SpO2:  [96 %-100 %] 97 % (09/14 0517) Weight:  [101.2 kg] 101.2 kg (09/13 1041) Last BM Date: 04/03/21  Intake/Output from previous day: 09/13 0701 - 09/14 0700 In: 750 [I.V.:750] Out: 625 [Urine:600; Blood:25] Intake/Output this shift: No intake/output data recorded.  PE: Gen:  Alert, NAD, pleasant Pulm: rate and effort normal Abd: Soft, NT/ND Skin: warm and dry GU: left peri-rectal/gluteal abscess s/p I&D with kerlex packed into wound and penrose drain present, overlying ABD pads saturated and were changed  Lab Results:  Recent Labs    04/03/21 1456 04/04/21 0604  WBC 15.2* 14.3*  HGB 16.0 13.3  HCT 47.4 38.4*  PLT 210 180   BMET Recent Labs    04/03/21 1456 04/04/21 0604  NA 131* 129*  K 4.4 4.4  CL 95* 98  CO2 21* 19*  GLUCOSE 252* 290*  BUN 17 20  CREATININE 1.38* 1.26*  CALCIUM 8.8* 8.1*   PT/INR Recent Labs    04/03/21 1456  LABPROT 13.8  INR 1.1   CMP     Component Value Date/Time   NA 129 (L) 04/04/2021 0604   NA 139 08/24/2018 1750   K 4.4 04/04/2021 0604   CL 98 04/04/2021 0604   CO2 19 (L) 04/04/2021 0604   GLUCOSE 290 (H) 04/04/2021 0604   BUN 20 04/04/2021 0604   BUN 14 08/24/2018 1750   CREATININE 1.26 (H) 04/04/2021 0604   CREATININE 1.25 01/08/2016 1611   CALCIUM 8.1 (L) 04/04/2021 0604   PROT 8.5 (H) 04/03/2021 1456   PROT 7.6 12/17/2017 1018   ALBUMIN 3.2 (L) 04/03/2021 1456   ALBUMIN 3.8 12/17/2017 1018   AST 17 04/03/2021 1456   ALT 12 04/03/2021 1456   ALKPHOS 84 04/03/2021 1456   BILITOT 2.0 (H) 04/03/2021 1456   BILITOT 0.8 12/17/2017 1018   GFRNONAA >60 04/04/2021 0604    GFRNONAA 68 01/08/2016 1611   GFRAA 105 08/24/2018 1750   GFRAA 79 01/08/2016 1611   Lipase  No results found for: LIPASE     Studies/Results: CT PELVIS W CONTRAST  Result Date: 04/03/2021 CLINICAL DATA:  Rectal abscess for 4 days,, previous incision and drainage for rectal abscess July 2022, history of Crohn disease EXAM: CT PELVIS WITH CONTRAST TECHNIQUE: Multidetector CT imaging of the pelvis was performed using the standard protocol following the bolus administration of intravenous contrast. CONTRAST:  30m OMNIPAQUE IOHEXOL 350 MG/ML SOLN COMPARISON:  11/26/2013 FINDINGS: Urinary Tract:  The distal ureters and bladder are unremarkable. Bowel: No bowel obstruction or ileus. Postsurgical changes are seen from subtotal colectomy and reanastomosis. There is wall thickening at the site of anastomosis within the ileal pouch which may reflect recurrent inflammatory bowel disease. There is a large abscess within the left perianal region, measuring up to 7.6 x 5.6 cm in transverse dimension and extending approximately 10.3 cm in craniocaudal extent. There is likely fistulous connection with the anal verge to the left midline. Inflammatory changes extend into the left aspect of the perineum may involve the base of the penis, with a small gas and fluid collection reference image 48/3 measuring 2.7 x 1.0 cm. A linear radiopaque structure is  seen coiled within the perineum, likely setons for treatment of the known fistula. Vascular/Lymphatic: Mild atherosclerosis. No pathologically enlarged lymph nodes. Reproductive: Mild prostate enlargement unchanged since prior study. Other: No free intraperitoneal fluid or free gas. No abdominal wall hernia. Musculoskeletal: There are no acute or destructive bony lesions. Reconstructed images demonstrate no additional findings. IMPRESSION: 1. 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 Small extension of this abscess  at of the perineum at the base of the penis as above. 2. Coiled radiopaque structure within the perineum likely reflects setons for treatment of known fistula. 3. Subtotal colectomy, with bowel wall thickening at the ileal pouch consistent with inflammatory bowel disease. No evidence of obstruction. 4.  Aortic Atherosclerosis (ICD10-I70.0). Electronically Signed   By: Randa Ngo M.D.   On: 04/03/2021 19:40    Anti-infectives: Anti-infectives (From admission, onward)    Start     Dose/Rate Route Frequency Ordered Stop   04/04/21 1000  vancomycin (VANCOREADY) IVPB 750 mg/150 mL        750 mg 150 mL/hr over 60 Minutes Intravenous Every 12 hours 04/04/21 0708     04/04/21 0800  ceFEPIme (MAXIPIME) 2 g in sodium chloride 0.9 % 100 mL IVPB        2 g 200 mL/hr over 30 Minutes Intravenous Every 8 hours 04/04/21 0705     04/04/21 0400  ceFEPIme (MAXIPIME) 2 g in sodium chloride 0.9 % 100 mL IVPB  Status:  Discontinued        2 g 200 mL/hr over 30 Minutes Intravenous Every 8 hours 04/03/21 1944 04/03/21 2301   04/04/21 0400  piperacillin-tazobactam (ZOSYN) IVPB 3.375 g  Status:  Discontinued        3.375 g 12.5 mL/hr over 240 Minutes Intravenous Every 8 hours 04/03/21 2258 04/04/21 0618   04/03/21 1945  vancomycin (VANCOCIN) 2,250 mg in sodium chloride 0.9 % 500 mL IVPB        2,250 mg 250 mL/hr over 120 Minutes Intravenous  Once 04/03/21 1936 04/03/21 2258   04/03/21 1945  ceFEPIme (MAXIPIME) 2 g in sodium chloride 0.9 % 100 mL IVPB        2 g 200 mL/hr over 30 Minutes Intravenous  Once 04/03/21 1936 04/03/21 2022   04/03/21 1942  vancomycin variable dose per unstable renal function (pharmacist dosing)  Status:  Discontinued         Does not apply See admin instructions 04/03/21 1944 04/04/21 0617        Assessment/Plan POD#0 left peri-rectal/gluteal abscess s/p incision and drainage  9/14 Dr. Bobbye Morton - cultures pending, continue broad spectrum antibiotics for now - Keep packing in place  today and plan to d/c tomorrow and initiate sitz baths. Change overlying ABD pad PRN saturation.  ID - maxipime/ vancomycin 9/13>> FEN - CM diet VTE - SCDs, lovenox Foley - d/c foley when mobilizing  Hx of presumed UC (open rPC/J pouch - colectomy with Dr. Leafy Kindle, pouch with Dr. 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

## 2021-04-04 NOTE — Progress Notes (Signed)
Pharmacy Antibiotic Note  Sean Schaefer is a 55 y.o. male admitted on 04/03/2021 with sepsis,recurrent abscess. Of note, patient has history of recurrent abscess with recent I&D of perianal abscess 02/05/21. Pharmacy has been consulted for cefepime and vancomycin dosing.  WBC 14, Tmax 103.30F SCr 1.2.  Plan: Cefepime 2gm every 8 hours Vancomycin 2500 mg IV x1 then 72m IV q12 hours Monitor renal function, CBC, cultures/sensitivities, LOT and de-escalate as able Vancomycin levels as needed  Temp (24hrs), Avg:99.3 F (37.4 C), Min:98 F (36.7 C), Max:103.1 F (39.5 C)  Recent Labs  Lab 04/03/21 1155 04/03/21 1440 04/03/21 1456 04/04/21 0604  WBC 13.8*  --  15.2* 14.3*  CREATININE 1.24  --  1.38* 1.26*  LATICACIDVEN  --  1.9  --   --      Estimated Creatinine Clearance: 80.8 mL/min (A) (by C-G formula based on SCr of 1.26 mg/dL (H)).    No Known Allergies  Antimicrobials this admission: Vancomycin 9/13 >> Cefepime 9/13 >>  Dose adjustments this admission: none  Microbiology results: 9/13 BCx: pending  Thank you for allowing pharmacy to be a part of this patient's care. FErin HearingPharmD., BCPS Clinical Pharmacist 04/04/2021 7:09 AM

## 2021-04-04 NOTE — Anesthesia Procedure Notes (Addendum)
Procedure Name: Intubation Date/Time: 04/03/2021 11:49 PM Performed by: Suzy Bouchard, CRNA Pre-anesthesia Checklist: Patient identified, Emergency Drugs available, Suction available, Timeout performed and Patient being monitored Patient Re-evaluated:Patient Re-evaluated prior to induction Oxygen Delivery Method: Circle system utilized Preoxygenation: Pre-oxygenation with 100% oxygen Induction Type: IV induction Ventilation: Mask ventilation without difficulty Laryngoscope Size: Miller and 2 Grade View: Grade II Tube type: Oral Tube size: 7.5 mm Number of attempts: 1 Airway Equipment and Method: Stylet Placement Confirmation: ETT inserted through vocal cords under direct vision, positive ETCO2 and breath sounds checked- equal and bilateral Secured at: 25 cm Tube secured with: Tape Dental Injury: Teeth and Oropharynx as per pre-operative assessment

## 2021-04-04 NOTE — Plan of Care (Signed)
  Problem: Clinical Measurements: Goal: Ability to maintain clinical measurements within normal limits will improve Outcome: Progressing Goal: Will remain free from infection Outcome: Progressing   Problem: Activity: Goal: Risk for activity intolerance will decrease Outcome: Progressing   

## 2021-04-05 ENCOUNTER — Other Ambulatory Visit (HOSPITAL_COMMUNITY): Payer: Self-pay

## 2021-04-05 LAB — BASIC METABOLIC PANEL
Anion gap: 11 (ref 5–15)
BUN: 23 mg/dL — ABNORMAL HIGH (ref 6–20)
CO2: 22 mmol/L (ref 22–32)
Calcium: 7.8 mg/dL — ABNORMAL LOW (ref 8.9–10.3)
Chloride: 98 mmol/L (ref 98–111)
Creatinine, Ser: 1.02 mg/dL (ref 0.61–1.24)
GFR, Estimated: >60 mL/min (ref >60)
Glucose, Bld: 259 mg/dL — ABNORMAL HIGH (ref 70–99)
Potassium: 4.2 mmol/L (ref 3.5–5.1)
Sodium: 131 mmol/L — ABNORMAL LOW (ref 135–145)

## 2021-04-05 LAB — GLUCOSE, CAPILLARY
Glucose-Capillary: 232 mg/dL — ABNORMAL HIGH (ref 70–99)
Glucose-Capillary: 209 mg/dL — ABNORMAL HIGH (ref 70–99)

## 2021-04-05 LAB — CBC
HCT: 37.2 % — ABNORMAL LOW (ref 39.0–52.0)
Hemoglobin: 12.8 g/dL — ABNORMAL LOW (ref 13.0–17.0)
MCH: 29.6 pg (ref 26.0–34.0)
MCHC: 34.4 g/dL (ref 30.0–36.0)
MCV: 86.1 fL (ref 80.0–100.0)
Platelets: 183 10*3/uL (ref 150–400)
RBC: 4.32 MIL/uL (ref 4.22–5.81)
RDW: 11.6 % (ref 11.5–15.5)
WBC: 13.5 10*3/uL — ABNORMAL HIGH (ref 4.0–10.5)
nRBC: 0 % (ref 0.0–0.2)

## 2021-04-05 MED ORDER — ACETAMINOPHEN 500 MG PO TABS: 1000.0000 mg | ORAL_TABLET | Freq: Four times a day (QID) | ORAL | 0 refills | Status: DC | PRN

## 2021-04-05 MED ORDER — OXYCODONE HCL 5 MG PO TABS
5.0000 mg | ORAL_TABLET | Freq: Four times a day (QID) | ORAL | 0 refills | Status: DC | PRN
  Filled 2021-04-05: qty 15, 2d supply, fill #0

## 2021-04-05 MED ORDER — METHOCARBAMOL 500 MG PO TABS
1000.0000 mg | ORAL_TABLET | Freq: Three times a day (TID) | ORAL | 0 refills | Status: DC
  Filled 2021-04-05: qty 20, 4d supply, fill #0

## 2021-04-05 MED ORDER — POLYETHYLENE GLYCOL 3350 17 G PO PACK: 17.0000 g | PACK | Freq: Every day | ORAL | 0 refills | Status: DC | PRN

## 2021-04-05 NOTE — Discharge Summary (Signed)
[facility] Discharge Summary   Patient ID: Sean Schaefer MRN: [id number] DOB/AGE: December 22, 1965 55 y.o.  Admit date: 04/03/2021 Discharge date: 04/05/2021  Admitting Diagnosis: Left peri-rectal/gluteal abscess  Discharge Diagnosis Patient Active Problem List   Diagnosis Date Noted   Gluteal abscess 04/03/2021   Peri-rectal abscess 04/03/2021   Uncontrolled type 2 diabetes mellitus with hyperglycemia ([place]) 04/03/2021   Perirectal abscess 11/30/2020   Patellar malalignment syndrome, left 12/18/2016   Diabetes ([place]) 12/26/2014   Latent tuberculosis by blood test 12/18/2013   Crohn's disease ([place]) 12/18/2013    Consultants None  Imaging: CT PELVIS W CONTRAST  Result Date: 04/03/2021 CLINICAL DATA:  Rectal abscess for 4 days,, previous incision and drainage for rectal abscess July 2022, history of Crohn disease EXAM: CT PELVIS WITH CONTRAST TECHNIQUE: Multidetector CT imaging of the pelvis was performed using the standard protocol following the bolus administration of intravenous contrast. CONTRAST:  47m OMNIPAQUE IOHEXOL 350 MG/ML SOLN COMPARISON:  11/26/2013 FINDINGS: Urinary Tract:  The distal ureters and bladder are unremarkable. Bowel: No bowel obstruction or ileus. Postsurgical changes are seen from subtotal colectomy and reanastomosis. There is wall thickening at the site of anastomosis within the ileal pouch which may reflect recurrent inflammatory bowel disease. There is a large abscess within the left perianal region, measuring up to 7.6 x 5.6 cm in transverse dimension and extending approximately 10.3 cm in craniocaudal extent. There is likely fistulous connection with the anal verge to the left midline. Inflammatory changes extend into the left aspect of the perineum may involve the base of the penis, with a small gas and fluid collection reference image 48/3 measuring 2.7 x 1.0 cm. A linear radiopaque structure is seen coiled within the perineum, likely setons for  treatment of the known fistula. Vascular/Lymphatic: Mild atherosclerosis. No pathologically enlarged lymph nodes. Reproductive: Mild prostate enlargement unchanged since prior study. Other: No free intraperitoneal fluid or free gas. No abdominal wall hernia. Musculoskeletal: There are no acute or destructive bony lesions. Reconstructed images demonstrate no additional findings. IMPRESSION: 1. 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 Small extension of this abscess at of the perineum at the base of the penis as above. 2. Coiled radiopaque structure within the perineum likely reflects setons for treatment of known fistula. 3. Subtotal colectomy, with bowel wall thickening at the ileal pouch consistent with inflammatory bowel disease. No evidence of obstruction. 4.  Aortic Atherosclerosis (ICD10-I70.0). Electronically Signed   By: MRanda NgoM.D.   On: 04/03/2021 19:40    Procedures Dr. LBobbye Morton(04/04/2021) - incision and drainage of left peri-rectal/gluteal abscess  Hospital Course:  Sean OZBURNis a 89yomale PMH UC and a history of multiple peri-rectal abscesses, most recently 01/2021 requiring operative drainage and seton placement by Dr. WDema Severin H/o subtotal colectomy with ostomy takedown and J-pouch with post-op dx of Crohns disease. Onset of pain 9/9 with fevers and increasing size of L buttock. Workup showed recurrent peri-rectal abscess.  Patient was admitted and underwent procedure listed above.  Tolerated procedure well and was transferred to the floor.  He was kept on maxipime and vancomycin during admission.  On POD1, the patient was voiding well, tolerating diet, ambulating well, pain well controlled, vital signs stable and felt stable for discharge home.  Patient will follow up as below and knows to call with questions or concerns.    I have personally reviewed the patients medication history on the [place] controlled substance database.  Allergies as of 04/05/2021   No Known Allergies      Medication List     TAKE these medications    acetaminophen 500 MG tablet Commonly known as: TYLENOL Take 2 tablets (1,000 mg total) by mouth every 6 (six) hours as needed for mild pain.   blood glucose meter kit and supplies Dispense based on patient and insurance preference. Use up to four times daily as directed. (FOR ICD-10 E10.9, E11.9).   FreeStyle Libre 14 Day Reader Kerrin Mo Use as directed   [employer] 14 Day Sensor Misc Apply 1 sensor to the back of the upper arm every 14 days   FreeStyle Precision Neo Test test strip Generic drug: glucose blood 1 each by Other route daily. Use Freestyle Precision Neo test strips as instructed to check blood sugar once daily.   HumaLOG KwikPen 100 UNIT/ML KwikPen Generic drug: insulin lispro INJECT 8 UNITS BEFORE EACH MEAL OR AS DIRECTED What changed: See the new instructions.   methocarbamol 500 MG tablet Commonly known as: ROBAXIN Take 2 tablets (1,000 mg total) by mouth every 8 (eight) hours.   oxyCODONE 5 MG immediate release tablet Commonly known as: Oxy IR/ROXICODONE Take 1-2 tablets (5-10 mg total) by mouth every 6 (six) hours as needed for severe pain.   polyethylene glycol 17 g packet Commonly known as: MIRALAX / GLYCOLAX Take 17 g by mouth daily as needed for mild constipation.   rosuvastatin 10 MG tablet Commonly known as: Crestor Take 1 tablet by mouth once daily   Tresiba FlexTouch 100 UNIT/ML FlexTouch Pen Generic drug: insulin degludec INJECT 38 UNITS UNDER THE SKIN ONCE A DAY What changed: Another medication with the same name was changed. Make sure you understand how and when to take each.   Tyler Aas FlexTouch 100 UNIT/ML FlexTouch Pen Generic drug: insulin degludec Inject 40 units subcutaneously daily at bedtime What changed:  how much to take when to take this          Follow-up Information     Ileana Roup, MD. Go on  04/24/2021.   Specialties: General Surgery, Colon and Rectal Surgery Why: Your appointment is 04/24/21 at 11:50 am  Please arrive 15 minutes early to check in. Contact information: [place] [address] [phone]                 Signed: Wellington Hampshire, [facility] 04/05/2021, 1:20 PM Please see Amion for pager number during day hours 7:00am-4:30pm

## 2021-04-05 NOTE — Progress Notes (Signed)
[facility] Progress Note  2 Days Post-Op  Subjective: CC-  Was pretty sore this AM, but medication helping.    Objective: Vital signs in last 24 hours: Temp:  [97.6 F (36.4 C)-98 F (36.7 C)] 98 F (36.7 C) (09/15 0806) Pulse Rate:  [90-96] 96 (09/15 0806) Resp:  [18] 18 (09/15 0806) BP: (122-145)/(67-75) 136/75 (09/15 0806) SpO2:  [96 %-97 %] 97 % (09/15 0806) Last BM Date: 04/03/21  Intake/Output from previous day: 09/14 0701 - 09/15 0700 In: 862.7 [P.O.:600; IV Piggyback:262.7] Out: 1800 [Urine:1800] Intake/Output this shift: No intake/output data recorded.  PE: Gen:  Alert, NAD, pleasant Pulm: rate and effort normal Skin: warm and dry GU: left peri-rectal/gluteal abscess s/p I&D. Kerlix removed intact.  Outer pads changed.    Lab Results:  Recent Labs    04/04/21 0604 04/05/21 0134  WBC 14.3* 13.5*  HGB 13.3 12.8*  HCT 38.4* 37.2*  PLT 180 183   BMET Recent Labs    04/04/21 0604 04/05/21 0134  NA 129* 131*  K 4.4 4.2  CL 98 98  CO2 19* 22  GLUCOSE 290* 259*  BUN 20 23*  CREATININE 1.26* 1.02  CALCIUM 8.1* 7.8*   PT/INR Recent Labs    04/03/21 1456  LABPROT 13.8  INR 1.1   CMP     Component Value Date/Time   NA 131 (L) 04/05/2021 0134   NA 139 08/24/2018 1750   K 4.2 04/05/2021 0134   CL 98 04/05/2021 0134   CO2 22 04/05/2021 0134   GLUCOSE 259 (H) 04/05/2021 0134   BUN 23 (H) 04/05/2021 0134   BUN 14 08/24/2018 1750   CREATININE 1.02 04/05/2021 0134   CREATININE 1.25 01/08/2016 1611   CALCIUM 7.8 (L) 04/05/2021 0134   PROT 8.5 (H) 04/03/2021 1456   PROT 7.6 12/17/2017 1018   ALBUMIN 3.2 (L) 04/03/2021 1456   ALBUMIN 3.8 12/17/2017 1018   AST 17 04/03/2021 1456   ALT 12 04/03/2021 1456   ALKPHOS 84 04/03/2021 1456   BILITOT 2.0 (H) 04/03/2021 1456   BILITOT 0.8 12/17/2017 1018   GFRNONAA >60 04/05/2021 0134   GFRNONAA 68 01/08/2016 1611   GFRAA 105 08/24/2018 1750   GFRAA 79 01/08/2016 1611   Lipase  No  results found for: LIPASE     Studies/Results: CT PELVIS W CONTRAST  Result Date: 04/03/2021 CLINICAL DATA:  Rectal abscess for 4 days,, previous incision and drainage for rectal abscess July 2022, history of Crohn disease EXAM: CT PELVIS WITH CONTRAST TECHNIQUE: Multidetector CT imaging of the pelvis was performed using the standard protocol following the bolus administration of intravenous contrast. CONTRAST:  59m OMNIPAQUE IOHEXOL 350 MG/ML SOLN COMPARISON:  11/26/2013 FINDINGS: Urinary Tract:  The distal ureters and bladder are unremarkable. Bowel: No bowel obstruction or ileus. Postsurgical changes are seen from subtotal colectomy and reanastomosis. There is wall thickening at the site of anastomosis within the ileal pouch which may reflect recurrent inflammatory bowel disease. There is a large abscess within the left perianal region, measuring up to 7.6 x 5.6 cm in transverse dimension and extending approximately 10.3 cm in craniocaudal extent. There is likely fistulous connection with the anal verge to the left midline. Inflammatory changes extend into the left aspect of the perineum may involve the base of the penis, with a small gas and fluid collection reference image 48/3 measuring 2.7 x 1.0 cm. A linear radiopaque structure is seen coiled within the perineum, likely setons for treatment of the known fistula.  Vascular/Lymphatic: Mild atherosclerosis. No pathologically enlarged lymph nodes. Reproductive: Mild prostate enlargement unchanged since prior study. Other: No free intraperitoneal fluid or free gas. No abdominal wall hernia. Musculoskeletal: There are no acute or destructive bony lesions. Reconstructed images demonstrate no additional findings. IMPRESSION: 1. 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 Small extension of this abscess at of the perineum at the base of the penis as above. 2. Coiled radiopaque structure within the  perineum likely reflects setons for treatment of known fistula. 3. Subtotal colectomy, with bowel wall thickening at the ileal pouch consistent with inflammatory bowel disease. No evidence of obstruction. 4.  Aortic Atherosclerosis (ICD10-I70.0). Electronically Signed   By: Randa Ngo M.D.   On: 04/03/2021 19:40    Anti-infectives: Anti-infectives (From admission, onward)    Start     Dose/Rate Route Frequency Ordered Stop   04/04/21 1000  vancomycin (VANCOREADY) IVPB 750 mg/150 mL        750 mg 150 mL/hr over 60 Minutes Intravenous Every 12 hours 04/04/21 0708     04/04/21 0800  ceFEPIme (MAXIPIME) 2 g in sodium chloride 0.9 % 100 mL IVPB        2 g 200 mL/hr over 30 Minutes Intravenous Every 8 hours 04/04/21 0705     04/04/21 0400  ceFEPIme (MAXIPIME) 2 g in sodium chloride 0.9 % 100 mL IVPB  Status:  Discontinued        2 g 200 mL/hr over 30 Minutes Intravenous Every 8 hours 04/03/21 1944 04/03/21 2301   04/04/21 0400  piperacillin-tazobactam (ZOSYN) IVPB 3.375 g  Status:  Discontinued        3.375 g 12.5 mL/hr over 240 Minutes Intravenous Every 8 hours 04/03/21 2258 04/04/21 0618   04/03/21 1945  vancomycin (VANCOCIN) 2,250 mg in sodium chloride 0.9 % 500 mL IVPB        2,250 mg 250 mL/hr over 120 Minutes Intravenous  Once 04/03/21 1936 04/03/21 2258   04/03/21 1945  ceFEPIme (MAXIPIME) 2 g in sodium chloride 0.9 % 100 mL IVPB        2 g 200 mL/hr over 30 Minutes Intravenous  Once 04/03/21 1936 04/03/21 2022   04/03/21 1942  vancomycin variable dose per unstable renal function (pharmacist dosing)  Status:  Discontinued         Does not apply See admin instructions 04/03/21 1944 04/04/21 0617        Assessment/Plan POD#1 left peri-rectal/gluteal abscess s/p incision and drainage  9/14 Dr. Bobbye Morton - cultures pending, will d/c antibiotics upon d/c.  Sitz baths Change overlying ABD pad PRN saturation.  ID - maxipime/ vancomycin 9/13>> FEN - CM diet VTE - SCDs, lovenox Foley  - out, voiding spontaneously  Hx of presumed UC (open rPC/J pouch - colectomy with Dr. Leafy Kindle, pouch with Dr. 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     LOS: 2 days   Milus Height, MD FACS Surgical Oncology, General Surgery, Trauma and [facility] [facility], [place] for weekday/non holidays Check [email or web address] for coverage night/weekend/holidays  Do not use SecureChat as it is not reliable for timely patient care.

## 2021-04-05 NOTE — Plan of Care (Signed)

## 2021-04-05 NOTE — Progress Notes (Signed)
AVS given and explained to patient.

## 2021-04-05 NOTE — Anesthesia Postprocedure Evaluation (Signed)
Anesthesia Post Note  Patient: Sean Schaefer  Procedure(s) Performed: left peri-rectal/gluteal abscess (Left: Buttocks)     Patient location during evaluation: PACU Anesthesia Type: General Level of consciousness: sedated and patient cooperative Pain management: pain level controlled Vital Signs Assessment: post-procedure vital signs reviewed and stable Respiratory status: spontaneous breathing Cardiovascular status: stable Anesthetic complications: no   No notable events documented.  Last Vitals:  Vitals:   04/04/21 2035 04/05/21 0806  BP: (!) 145/68 136/75  Pulse: 90 96  Resp: 18 18  Temp: 36.4 C 36.7 C  SpO2: 96% 97%    Last Pain:  Vitals:   04/05/21 0811  TempSrc:   PainSc: [place]

## 2021-04-06 ENCOUNTER — Other Ambulatory Visit: Payer: Self-pay | Admitting: *Deleted

## 2021-04-06 ENCOUNTER — Other Ambulatory Visit (HOSPITAL_COMMUNITY): Payer: Self-pay

## 2021-04-06 NOTE — Patient Outreach (Signed)
Sean Schaefer) Care Management  04/06/2021  Sean Schaefer [birth year] [id number]   Transition of care telephone call  Referral received:04/03/21 Initial outreach:04/06/21 Insurance: [place] focus   Initial unsuccessful telephone call to patient's preferred number in order to complete transition of care assessment; no answer, left HIPAA compliant voicemail message requesting return call.   Objective: Per the electronic medical record, [name] was hospitalized at [facility]  9/13-9/15/22 I&D left perianal gluteal abscess . Comorbidities include: History of Crohn's disease, hx of ileal  j -pouch, hypertension , recurrent perianal abscesses , and multiple incision and drainage procedures, Diabetes type 2, A1c 8.6%.  He was discharged to home on 04/05/21 without the need for home health services or durable medical equipment per the discharge summary.   Plan: This RNCM will route unsuccessful outreach letter with [place] Management pamphlet and 24 hour Nurse Advice Line Magnet to [place] [facility] clinical pool to be mailed to patient's home address. This RNCM will attempt another outreach within 4 business days.   Joylene Draft, RN, BSN  [place] Management Coordinator  [phone]- Mobile [phone]- Toll Free Main Office   Joylene Draft, [place], BSN  [place] Management Coordinator  [phone]- Mobile [phone]- Toll Free Main Office

## 2021-04-08 LAB — CULTURE, BLOOD (ROUTINE X 2): Culture: NO GROWTH

## 2021-04-09 ENCOUNTER — Other Ambulatory Visit (HOSPITAL_COMMUNITY): Payer: Self-pay

## 2021-04-09 LAB — AEROBIC/ANAEROBIC CULTURE W GRAM STAIN (SURGICAL/DEEP WOUND)

## 2021-04-09 LAB — CULTURE, BLOOD (ROUTINE X 2)
Culture: NO GROWTH
Special Requests: ADEQUATE

## 2021-04-09 MED ORDER — OXYCODONE HCL 5 MG PO TABS
ORAL_TABLET | ORAL | 0 refills | Status: DC
  Filled 2021-04-09: qty 15, 4d supply, fill #0

## 2021-04-10 ENCOUNTER — Telehealth: Payer: Self-pay | Admitting: Gastroenterology

## 2021-04-10 NOTE — Telephone Encounter (Signed)
I saw this patient for initial office consult on 04/03/2021 with Crohn's disease and chronic fistula.  He will need to start Humira, but the day I saw him he required hospitalization for a perirectal/buttock abscess requiring drainage and antibiotics.  A TB QuantiFERON gold was negative  Hepatitis A total antibody and hepatitis B surface antibody both negative, thus he needs the following:   - the 2 dose Havrix combination HAV/HBV vaccine (please arrange with our office)  - he needs an annual flu vaccine as soon as they are available and a COVID booster if he is due for that.   - please check his immunization records and see if he is up-to-date on TDAP to make sure he does not need a booster for tetanus or pertussis.  (If so, would need to see PCP for that soon)   -Please check on whether he has received either type of pneumococcal vaccine.  He most likely has not since he is under usual age for that.  If not, then he needs the Prevnar 13 with [place], and we would give him the pneumococcal 23 in several months.   Also, I would also like to work on Public relations account executive for Humira at standard dosing. However, I do not want him to start the Humira yet until we are sure he is fully treated for and recovered from the infection.  Please communicate all this to Sean Schaefer and get him the next available office visit to see me.  - HD

## 2021-04-11 ENCOUNTER — Other Ambulatory Visit: Payer: Self-pay | Admitting: *Deleted

## 2021-04-11 MED ORDER — HUMIRA-CD/UC/HS STARTER 80 MG/0.8ML ~~LOC~~ AJKT: AUTO-INJECTOR | SUBCUTANEOUS | 0 refills | Status: DC

## 2021-04-11 MED ORDER — HUMIRA (2 PEN) 40 MG/0.4ML ~~LOC~~ AJKT: 1.0000 "pen " | AUTO-INJECTOR | SUBCUTANEOUS | 11 refills | Status: DC

## 2021-04-11 NOTE — Telephone Encounter (Signed)
Spoke with patient in regards to recommendations. Pt states that he has a follow up with Dr. Nadeen Landau on 04/24/21 at 11:15 am. Patient has been scheduled for a follow up with Dr. Loletha Carrow on Friday, 04/27/21 at 1:20 PM. Pt states that he did have TDAP vaccine in 2019 so he is up to date. Pt has never had pneumococcal vaccine. Patient states that he will call us a few days prior to his appt if he wants to proceed with vaccinations on the day of his follow up appt. Pt verbalized understanding of all information and had no concerns at the end of the call.

## 2021-04-11 NOTE — Telephone Encounter (Signed)
Lm on vm for patient to return call.  Humira starter dose prescription and Humira maintenance dose sent to [facility] to work on authorization. Records, demographic, and insurance information faxed to [facility].

## 2021-04-11 NOTE — Patient Outreach (Signed)
[facility]) Care Management  04/11/2021  KIER SMEAD [birth year] [id number]   Transition of care call Referral received: 04/03/21 Initial outreach attempt: 04/06/21 Insurance: [place]    2nd unsuccessful telephone call to patient's preferred contact number in order to complete post hospital discharge transition of care assessment , no answer left HIPAA compliant message requesting return call.    Objective: Per the electronic medical record, Mr. Hoover Grewe was hospitalized at [facility]  9/13-9/15/22 I&D left perianal gluteal abscess . Comorbidities include: History of Crohn's disease, hx of ileal  j -pouch, hypertension , recurrent perianal abscesses , and multiple incision and drainage procedures, Diabetes type 2, A1c 8.6%.  He was discharged to home on 04/05/21 without the need for home health services or durable medical equipment per the discharge summary.    Plan If no return call from patient will attempt 3rd outreach in the next 4 business days.    Joylene Draft, RN, BSN  [facility] Management Coordinator  [phone]- Mobile [phone]- Toll Free Main Office

## 2021-04-12 LAB — QUANTIFERON-TB GOLD PLUS
Mitogen-NIL: 1.76 IU/mL
NIL: 0.03 IU/mL
QuantiFERON-TB Gold Plus: NEGATIVE
TB1-NIL: 0.03 IU/mL
TB2-NIL: 0.03 IU/mL

## 2021-04-12 LAB — HEPATITIS B SURFACE ANTIGEN: Hepatitis B Surface Ag: NONREACTIVE

## 2021-04-12 LAB — THIOPURINE METHYLTRANSFERASE (TPMT), RBC: Thiopurine Methyltransferase, RBC: 14 nmol/hr/mL RBC

## 2021-04-12 LAB — HEPATITIS B SURFACE ANTIBODY,QUALITATIVE: Hep B S Ab: NONREACTIVE

## 2021-04-12 LAB — HEPATITIS A ANTIBODY, TOTAL: Hepatitis A AB,Total: NONREACTIVE

## 2021-04-13 ENCOUNTER — Other Ambulatory Visit (HOSPITAL_COMMUNITY): Payer: Self-pay

## 2021-04-13 MED ORDER — OXYCODONE HCL 5 MG PO TABS
ORAL_TABLET | ORAL | 0 refills | Status: DC
  Filled 2021-04-13: qty 8, 2d supply, fill #0

## 2021-04-13 MED ORDER — AMOXICILLIN-POT CLAVULANATE 875-125 MG PO TABS
ORAL_TABLET | ORAL | 0 refills | Status: DC
  Filled 2021-04-13: qty 14, 7d supply, fill #0

## 2021-04-16 ENCOUNTER — Other Ambulatory Visit: Payer: Self-pay | Admitting: *Deleted

## 2021-04-16 NOTE — Patient Outreach (Signed)
[facility]) Care Management  04/16/2021  Sean Schaefer [birth year] [id number]   Transition of care call Referral received: 04/03/21 Initial outreach attempt: 04/06/21 Insurance: [employer] Focus   Third unsuccessful telephone call to patient's preferred contact number in order to complete post hospital discharge transition of care assessment; no answer, left HIPAA compliant message requesting return call.  Placed call to patient wife, DPR, Sean Schaefer, no answer mailbox is full unable to leave a message.   Objective: Per the electronic medical record, Sean Schaefer was hospitalized at [facility]  9/13-9/15/22 I&D left perianal gluteal abscess . Comorbidities include: History of Crohn's disease, hx of ileal  j -pouch, hypertension , recurrent perianal abscesses , and multiple incision and drainage procedures, Diabetes type 2, A1c 8.6%.  He was discharged to home on 04/05/21 without the need for home health services or durable medical equipment per the discharge summary.  Plan: If no return call from patient, will plan return call in the next 3 weeks.    Joylene Draft, RN, BSN  [place] Management Coordinator  [phone]- Mobile [phone]- Toll Free Main Office

## 2021-04-27 ENCOUNTER — Encounter: Payer: Self-pay | Admitting: Gastroenterology

## 2021-04-27 ENCOUNTER — Ambulatory Visit (INDEPENDENT_AMBULATORY_CARE_PROVIDER_SITE_OTHER): Payer: No Typology Code available for payment source | Admitting: Gastroenterology

## 2021-04-27 VITALS — BP 146/88 | HR 83 | Ht 70.0 in | Wt 229.0 lb

## 2021-04-27 DIAGNOSIS — K50114 Crohn's disease of large intestine with abscess: Secondary | ICD-10-CM | POA: Diagnosis not present

## 2021-04-27 NOTE — Progress Notes (Addendum)
[facility] Progress Note  Chief Complaint: Fistulizing Crohn's disease  Subjective  History: Sean Schaefer follows up for his Crohn's disease after his initial visit here on September 13.  History is detailed in that note, and on that date he had a severe perianal/left buttock abscess requiring hospitalization with antibiotics and incision and drainage on September 14.  Reviewing the discharge summary, he does not appear to been discharged on antibiotics. Surgical clinic note 04/13/2021, patient reportedly much improved but required in-ffice drainage of infection on the right side and prescribed Augmentin and oxycodone  My phone note from 04/10/2021: "I saw this patient for initial office consult on 04/03/2021 with Crohn's disease and chronic fistula.  He will need to start Humira, but the day I saw him he required hospitalization for a perirectal/buttock abscess requiring drainage and antibiotics.   A TB QuantiFERON gold was negative   Hepatitis A total antibody and hepatitis B surface antibody both negative, thus he needs the following:    - the 2 dose Havrix combination HAV/HBV vaccine (please arrange with our office)   - he needs an annual flu vaccine as soon as they are available and a COVID booster if he is due for that.    - please check his immunization records and see if he is up-to-date on TDAP to make sure he does not need a booster for tetanus or pertussis.  (If so, would need to see PCP for that soon)    -Please check on whether he has received either type of pneumococcal vaccine.  He most likely has not since he is under usual age for that.  If not, then he needs the Prevnar 13 with [place], and we would give him the pneumococcal 23 in several months.     Also, I would also like to work on Public relations account executive for Humira at standard dosing. However, I do not want him to start the Humira yet until we are sure he is fully treated for and recovered from the infection.    Please communicate all this to [name] and get him the next available office visit to see me."   Nursing discovered the patient had had TDA P vaccine in 2019, never had pneumococcal vaccine  _____________________________________________________  Sean Schaefer was here with his wife today, and reports that he is much improved in the last time I saw him.  He has same bowel pattern of 4-6 "toothpaste consistency" stools per day, which has been the case for many years.  He denies rectal bleeding.  His buttock and perianal pain is considerably decreased after recent surgical interventions and antibiotics.  He was to have a visit with the surgical attending 2 days ago but he got a call that they needed to reschedule for surgical emergency.   ROS: Cardiovascular:  no chest pain Respiratory: no dyspnea  The patient's Past Medical, Family and Social History were reviewed and are on file in the EMR.  Objective:  Med list reviewed  Current Outpatient Medications:    acetaminophen (TYLENOL) 500 MG tablet, Take 2 tablets (1,000 mg total) by mouth every 6 (six) hours as needed for mild pain., Disp: 30 tablet, Rfl: 0   blood glucose meter kit and supplies, Dispense based on patient and insurance preference. Use up to four times daily as directed. (FOR ICD-10 E10.9, E11.9)., Disp: 1 each, Rfl: 0   Continuous Blood Gluc Receiver (FREESTYLE LIBRE 14 DAY READER) DEVI, Use as directed, Disp: 1 each, Rfl: 2  Continuous Blood Gluc Sensor (FREESTYLE LIBRE 14 DAY SENSOR) MISC, Apply 1 sensor to the back of the upper arm every 14 days, Disp: 3 each, Rfl: 3   glucose blood (FREESTYLE PRECISION NEO TEST) test strip, 1 each by Other route daily. Use Freestyle Precision Neo test strips as instructed to check blood sugar once daily., Disp: 50 each, Rfl: 3   HUMALOG KWIKPEN 100 UNIT/ML KwikPen, INJECT 8 UNITS BEFORE EACH MEAL OR AS DIRECTED (Patient taking differently: Inject 10 Units into the skin 3 (three) times daily  with meals.), Disp: 9 mL, Rfl: 1   rosuvastatin (CRESTOR) 10 MG tablet, Take 1 tablet by mouth once daily, Disp: 90 tablet, Rfl: 3   TRESIBA FLEXTOUCH 100 UNIT/ML FlexTouch Pen, INJECT 38 UNITS UNDER THE SKIN ONCE A DAY, Disp: 12 mL, Rfl: 3  Current Facility-Administered Medications:    Insulin Pen Needle (NOVOFINE) 10 each, 1 packet, Subcutaneous, QHS, Tereasa Coop, PA-C   Vital signs in last 24 hrs: Vitals:   04/27/21 1318  BP: (!) 146/88  Pulse: 83  SpO2: 98%   Wt Readings from Last 3 Encounters:  04/27/21 229 lb (103.9 kg)  04/03/21 223 lb 3.2 oz (101.2 kg)  02/05/21 220 lb 14.4 oz (100.2 kg)    Physical Exam  He looks much better than the last time I saw him.  Sitting comfortably in a chair, able to get on exam table without difficulty. HEENT: sclera anicteric, oral mucosa moist without lesions Neck: supple, no thyromegaly, JVD or lymphadenopathy Cardiac: RRR without murmurs, S1S2 heard, no peripheral edema Pulm: clear to auscultation bilaterally, normal RR and effort noted Abdomen: soft, no tenderness, with active bowel sounds. No guarding or palpable hepatosplenomegaly. Perianal and buttock fistula as before with setons, he now has an open wound in the left buttock with a Penrose drain.  No drainage.  He has firm surrounding tissue which seems chronic.  No apparent infection by the look of it  Labs:  TPMT enzyme activity normal at 14  Negative QuantiFERON gold  Hepatitis A total antibody, negative hepatitis B surface antigen and surface antibody (needs vaccination for both) ___________________________________________ Radiologic studies:  04/03/2021 CT scan pelvis at ED visit  CLINICAL DATA:  Rectal abscess for 4 days,, previous incision and drainage for rectal abscess July 2022, history of Crohn disease   EXAM: CT PELVIS WITH CONTRAST   TECHNIQUE: Multidetector CT imaging of the pelvis was performed using the standard protocol following the bolus  administration of intravenous contrast.   CONTRAST:  92m OMNIPAQUE IOHEXOL 350 MG/ML SOLN   COMPARISON:  11/26/2013   FINDINGS: Urinary Tract:  The distal ureters and bladder are unremarkable.   Bowel: No bowel obstruction or ileus. Postsurgical changes are seen from subtotal colectomy and reanastomosis. There is wall thickening at the site of anastomosis within the ileal pouch which may reflect recurrent inflammatory bowel disease.   There is a large abscess within the left perianal region, measuring up to 7.6 x 5.6 cm in transverse dimension and extending approximately 10.3 cm in craniocaudal extent. There is likely fistulous connection with the anal verge to the left midline. Inflammatory changes extend into the left aspect of the perineum may involve the base of the penis, with a small gas and fluid collection reference image 48/3 measuring 2.7 x 1.0 cm. A linear radiopaque structure is seen coiled within the perineum, likely setons for treatment of the known fistula.   Vascular/Lymphatic: Mild atherosclerosis. No pathologically enlarged lymph nodes.   Reproductive:  Mild prostate enlargement unchanged since prior study.   Other: No free intraperitoneal fluid or free gas. No abdominal wall hernia.   Musculoskeletal: There are no acute or destructive bony lesions. Reconstructed images demonstrate no additional findings.   IMPRESSION: 1. 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 Small extension of this abscess at of the perineum at the base of the penis as above. 2. Coiled radiopaque structure within the perineum likely reflects setons for treatment of known fistula. 3. Subtotal colectomy, with bowel wall thickening at the ileal pouch consistent with inflammatory bowel disease. No evidence of obstruction. 4.  Aortic Atherosclerosis (ICD10-I70.0).     Electronically Signed   By: Randa Ngo M.D.   On:  04/03/2021 19:40  ____________________________________________ Other:   _____________________________________________ Assessment & Plan  Assessment: Encounter Diagnosis  Name Primary?   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  By his recollection, he took Humira about 8 months one under Dr. Lorie Apley care about 2015, and does not feel it ever helped.  As noted at previous visit, this could have been related to its mechanism of action, dosing, frequency, lack of concomitant immunosuppressive therapy or combination of those factors.  We discussed that with his type of Crohn's disease, anti-TNF therapy in combination with azathioprine generally has a best track record for treatment success.  I had originally considered putting him back on Humira, but after further consideration, I believe Remicade would be a better choice for him.  He is unlikely to have developed Humira antibodies with a relatively short period of time he was on it, but we will have to consider a Humira failure.  In such cases, especially with severe challenging perianal fistulizing disease, we often try salvage Remicade before deciding that anti-TNF therapy is not successful.  It would need to get to a therapeutic dose as measured by drug levels and clinical response, and use in combination with azathioprine.  I discussed azathioprine, the combined immunosuppressive and malignancy risk of that, risk of nausea and pancreatitis.  He needs to get up-to-date on some vaccinations as noted below, and then should start this treatment.  Remicade would be given by infusion at the [facility] infusion center on [address] prior authorization done by that pharmacy staff.  While he preferred to have self injection medicine such as Humira, he understands the rationale for Remicade and is willing to do it.  Plan: Start Remicade standard dosing 5 mg/kg for  induction to ensure no reaction, then increase to 71m/kg with week 6 dose First hepatitis A and B vaccines (offered to him today, but he wanted to wait until next week)  Second hepatitis B and pneumococcal 13 vaccine in a month  We will wait until he is on stable Remicade dosing to begin azathioprine.  Close follow-up after that will be arranged.  42 minutes were spent on this encounter (including chart review, history/exam, counseling/coordination of care, and documentation) > 50% of that time was spent on counseling and coordination of care.   HNelida MeuseIII

## 2021-04-27 NOTE — Patient Instructions (Signed)
If you are age 53 or older, your body mass index should be between 23-30. Your Body mass index is 32.86 kg/m. If this is out of the aforementioned range listed, please consider follow up with your Primary Care Provider.  If you are age 61 or younger, your body mass index should be between 19-25. Your Body mass index is 32.86 kg/m. If this is out of the aformentioned range listed, please consider follow up with your Primary Care Provider.   __________________________________________________________  The [facility] providers would like to encourage you to use [facility] to communicate with providers for non-urgent requests or questions.  Due to long hold times on the telephone, sending your provider a message by [facility] may be a faster and more efficient way to get a response.  Please allow 48 business hours for a response.  Please remember that this is for non-urgent requests.   It was a pleasure to see you today!  Thank you for trusting me with your gastrointestinal care!

## 2021-04-30 ENCOUNTER — Telehealth: Payer: Self-pay | Admitting: Pharmacy Technician

## 2021-04-30 ENCOUNTER — Encounter: Payer: Self-pay | Admitting: Gastroenterology

## 2021-04-30 ENCOUNTER — Other Ambulatory Visit (HOSPITAL_COMMUNITY): Payer: Self-pay

## 2021-04-30 DIAGNOSIS — K50114 Crohn's disease of large intestine with abscess: Secondary | ICD-10-CM | POA: Insufficient documentation

## 2021-04-30 MED ORDER — LOSARTAN POTASSIUM 25 MG PO TABS
ORAL_TABLET | ORAL | 3 refills | Status: DC
  Filled 2021-04-30: qty 90, 90d supply, fill #0

## 2021-04-30 NOTE — Telephone Encounter (Addendum)
Auth Submission: PENDING Payer: [employer] Medication & CPT/J Code(s) submitted: Remicade (Infliximab) J1745 Route of submission (phone, fax, portal): PHONE: [phone] Auth type: Buy/Bill Units/visits requested: 8 Reference number: [id number] Clinical faxed to: [phone]   BIV: Coverage: Active (07/22/20) Deductible: none OOP: $2500 which has been met. Ref# [id number] Benefits # [phone]    (Fyi note) 1st and 2nd dose 41m/kg 3rd dose and onward 171mkg no separate PA required for different dosing verified with  REP - [id number].

## 2021-04-30 NOTE — Addendum Note (Signed)
Addended by: Nelida Meuse on: 04/30/2021 08:43 AM   Modules accepted: Orders

## 2021-05-02 ENCOUNTER — Ambulatory Visit (INDEPENDENT_AMBULATORY_CARE_PROVIDER_SITE_OTHER): Payer: No Typology Code available for payment source | Admitting: Gastroenterology

## 2021-05-02 DIAGNOSIS — Z23 Encounter for immunization: Secondary | ICD-10-CM

## 2021-05-07 NOTE — Telephone Encounter (Signed)
FYI NOTE: Remicade PA still pending.

## 2021-05-08 ENCOUNTER — Other Ambulatory Visit (HOSPITAL_COMMUNITY): Payer: Self-pay

## 2021-05-09 ENCOUNTER — Other Ambulatory Visit: Payer: Self-pay | Admitting: *Deleted

## 2021-05-09 ENCOUNTER — Other Ambulatory Visit: Payer: Self-pay

## 2021-05-09 MED ORDER — INFLIXIMAB 100 MG IV SOLR: INTRAVENOUS | 0 refills | Status: DC

## 2021-05-09 NOTE — Telephone Encounter (Signed)
Spoke with patient, he has been scheduled for a follow up with Dr. Loletha Carrow on Wednesday, 06/20/21 at 9 am. Pt had no concerns at the end of the call.

## 2021-05-09 NOTE — Patient Outreach (Signed)
[facility]) Care Management  05/09/2021  Sean Schaefer 09-25-1965 [id number]   Transition of care /Case Closure Unsuccessful outreach    Referral received:04/03/21 Initial outreach:04/06/21 Insurance: [place] focus  4th outreach attempt  Unable to complete post hospital discharge transition of care assessment. No return call form patient after 3 call attempts and no response to request to contact RN Care Coordinator in unsuccessful outreach letter mailed to home on 04/06/21.  Objective: Per the electronic medical record, [name] was hospitalized at [facility]  9/13-9/15/22 I&D left perianal gluteal abscess . Comorbidities include: History of Crohn's disease, hx of ileal  j -pouch, hypertension , recurrent perianal abscesses , and multiple incision and drainage procedures, Diabetes type 2, A1c 8.6%.  He was discharged to home on 04/05/21 without the need for home health services or durable medical equipment per the discharge summary.    Plan Case closed to [facility] care management services unsuccessful call attempt x 4.    Joylene Draft, RN, BSN  [place] Management Coordinator  [phone]- Mobile [phone]- Toll Free Main Office

## 2021-05-09 NOTE — Telephone Encounter (Signed)
Joelene Millin,  Thank you for the update and quick work on this. ____________________________  Herbert Seta,  Please arrange a clinic visit with me in 6-8 weeks  - HD

## 2021-05-09 NOTE — Telephone Encounter (Signed)
Dr. Loletha Carrow,  Juluis Rainier)  Auth Submission: REMICADE - APPROVED  Payer: [employer] Medication & CPT/J Code(s) submitted: Remicade (Infliximab) J1745 Route of submission (phone, fax, portal): PHONE [phone] Case manager: Vickii Chafe [phone] Auth type: Buy/Bill Units/visits requested: 4 Reference number: [id number] Approval from: 05/09/21 to 08/09/21  1st and 2nd dose = 86m/kg 3rd and onward (q8wks) dose = 111mkg  Auth is approved for D1, D14, D42 , will need to call for continuation of therapy for 8wk dosing in Dec. 2022  Patient will be scheduled as soon as possible. Kim.

## 2021-05-10 ENCOUNTER — Other Ambulatory Visit (HOSPITAL_COMMUNITY): Payer: Self-pay

## 2021-05-10 MED ORDER — FREESTYLE LIBRE 3 SENSOR MISC
11 refills | Status: DC
  Filled 2021-05-10: qty 2, 28d supply, fill #0

## 2021-05-11 ENCOUNTER — Other Ambulatory Visit (HOSPITAL_COMMUNITY): Payer: Self-pay

## 2021-06-02 ENCOUNTER — Other Ambulatory Visit (HOSPITAL_COMMUNITY): Payer: Self-pay

## 2021-06-04 ENCOUNTER — Other Ambulatory Visit (HOSPITAL_COMMUNITY): Payer: Self-pay

## 2021-06-04 ENCOUNTER — Ambulatory Visit (INDEPENDENT_AMBULATORY_CARE_PROVIDER_SITE_OTHER): Payer: No Typology Code available for payment source | Admitting: Gastroenterology

## 2021-06-04 DIAGNOSIS — Z23 Encounter for immunization: Secondary | ICD-10-CM | POA: Diagnosis not present

## 2021-06-07 ENCOUNTER — Other Ambulatory Visit: Payer: Self-pay

## 2021-06-07 ENCOUNTER — Ambulatory Visit (INDEPENDENT_AMBULATORY_CARE_PROVIDER_SITE_OTHER): Payer: No Typology Code available for payment source

## 2021-06-07 VITALS — BP 185/95 | HR 77 | Temp 98.6°F | Resp 18 | Ht 71.0 in | Wt 234.0 lb

## 2021-06-07 DIAGNOSIS — K50114 Crohn's disease of large intestine with abscess: Secondary | ICD-10-CM | POA: Diagnosis not present

## 2021-06-07 MED ORDER — HYDROCORTISONE NA SUCCINATE PF 100 MG IJ SOLR
100.0000 mg | Freq: Once | INTRAMUSCULAR | Status: AC
  Administered 2021-06-07: 10:00:00 100 mg via INTRAVENOUS
  Filled 2021-06-07: qty 2

## 2021-06-07 MED ORDER — ACETAMINOPHEN 325 MG PO TABS
650.0000 mg | ORAL_TABLET | Freq: Once | ORAL | Status: AC
  Administered 2021-06-07: 10:00:00 650 mg via ORAL
  Filled 2021-06-07: qty 2

## 2021-06-07 MED ORDER — DIPHENHYDRAMINE HCL 25 MG PO CAPS
25.0000 mg | ORAL_CAPSULE | Freq: Once | ORAL | Status: AC
  Administered 2021-06-07: 10:00:00 25 mg via ORAL
  Filled 2021-06-07: qty 1

## 2021-06-07 MED ORDER — SODIUM CHLORIDE 0.9 % IV SOLN
5.0000 mg/kg | Freq: Once | INTRAVENOUS | Status: AC
  Administered 2021-06-07: 11:00:00 500 mg via INTRAVENOUS
  Filled 2021-06-07: qty 50

## 2021-06-07 NOTE — Progress Notes (Signed)
Diagnosis: Crohn's Disease  Provider:  Marshell Garfinkel, MD  Procedure: Infusion  IV Type: Peripheral, IV Location: R Antecubital  Remicade (Infliximab), Dose: 500 mg  Infusion Start Time: 2589  Infusion Stop Time: 4834  Post Infusion IV Care: Observation period completed  Discharge: Condition: Good, Destination: Home . AVS provided to patient.   Performed by:  Paul Dykes, RN

## 2021-06-11 NOTE — Progress Notes (Signed)
Diagnosis: Crohn's Disease  Provider:  Marshell Garfinkel, MD  Procedure: Infusion  IV Type: Peripheral, IV Location: R Antecubital  Remicade (Infliximab), Dose: 500 mg  Infusion Start Time: 1893  Infusion Stop Time: 7374  Post Infusion IV Care: Observation period completed  Discharge: Condition: Good, Destination: Home . AVS provided to patient.   Performed by:  Ruthell Rummage RN

## 2021-06-20 ENCOUNTER — Other Ambulatory Visit: Payer: Self-pay | Admitting: Pharmacy Technician

## 2021-06-20 ENCOUNTER — Encounter: Payer: Self-pay | Admitting: Gastroenterology

## 2021-06-20 ENCOUNTER — Ambulatory Visit (INDEPENDENT_AMBULATORY_CARE_PROVIDER_SITE_OTHER): Payer: No Typology Code available for payment source | Admitting: Gastroenterology

## 2021-06-20 VITALS — BP 138/82 | HR 90 | Ht 70.0 in | Wt 231.0 lb

## 2021-06-20 DIAGNOSIS — K6289 Other specified diseases of anus and rectum: Secondary | ICD-10-CM | POA: Diagnosis not present

## 2021-06-20 DIAGNOSIS — K50113 Crohn's disease of large intestine with fistula: Secondary | ICD-10-CM

## 2021-06-20 DIAGNOSIS — Z7960 Long term (current) use of unspecified immunomodulators and immunosuppressants: Secondary | ICD-10-CM

## 2021-06-20 NOTE — Patient Instructions (Signed)
If you are age 53 or older, your body mass index should be between 23-30. Your Body mass index is 33.15 kg/m. If this is out of the aforementioned range listed, please consider follow up with your Primary Care Provider.  If you are age 58 or younger, your body mass index should be between 19-25. Your Body mass index is 33.15 kg/m. If this is out of the aformentioned range listed, please consider follow up with your Primary Care Provider.   ________________________________________________________  The [facility] providers would like to encourage you to use [facility] to communicate with providers for non-urgent requests or questions.  Due to long hold times on the telephone, sending your provider a message by [facility] may be a faster and more efficient way to get a response.  Please allow 48 business hours for a response.  Please remember that this is for non-urgent requests.  _______________________________________________________  Please follow up in 6 weeks.   It was a pleasure to see you today!  Thank you for trusting me with your gastrointestinal care!

## 2021-06-20 NOTE — Progress Notes (Signed)
GI Progress Note  Chief Complaint: Perianal fistulizing Crohn's disease  Subjective  History: Sean Schaefer follows up after his last office visit on 04/27/2021.  He has since received 2 doses of Heplisav vaccine for hepatitis B and first of 2 hepatitis A vaccines as well as pneumococcal 13. On November 17 he received his first Remicade infusion (500 mg, 5 mg/kg) without difficulty.  His second dose is scheduled for tomorrow.  Sean Schaefer was accompanied by his wife again.  He feels about the same as last time I saw him, with rectal and perianal pain, periodic bleeding but also notices more odor from the area. No fever or chills or night sweats. Denies abdominal pain.  Appetite is good.  Confirms feeling well with the first Remicade infusion and no reaction. ROS: Cardiovascular:  no chest pain Respiratory: no dyspnea  The patient's Past Medical, Family and Social History were reviewed and are on file in the EMR.  Objective:  Med list reviewed  Current Outpatient Medications:    acetaminophen (TYLENOL) 500 MG tablet, Take 2 tablets (1,000 mg total) by mouth every 6 (six) hours as needed for mild pain., Disp: 30 tablet, Rfl: 0   blood glucose meter kit and supplies, Dispense based on patient and insurance preference. Use up to four times daily as directed. (FOR ICD-10 E10.9, E11.9)., Disp: 1 each, Rfl: 0   Continuous Blood Gluc Receiver (FREESTYLE LIBRE 14 DAY READER) DEVI, Use as directed, Disp: 1 each, Rfl: 2   glucose blood (FREESTYLE PRECISION NEO TEST) test strip, 1 each by Other route daily. Use Freestyle Precision Neo test strips as instructed to check blood sugar once daily., Disp: 50 each, Rfl: 3   HUMALOG KWIKPEN 100 UNIT/ML KwikPen, INJECT 8 UNITS BEFORE EACH MEAL OR AS DIRECTED (Patient taking differently: Inject 10 Units into the skin 3 (three) times daily with meals.), Disp: 9 mL, Rfl: 1   inFLIXimab (REMICADE) 100 MG injection, REMICADE - [facility]  1st and 2nd dose = 1m/kg 3rd and onward (q8wks) dose = 159mkg, Disp: 1 each, Rfl: 0   rosuvastatin (CRESTOR) 10 MG tablet, Take 1 tablet by mouth once daily, Disp: 90 tablet, Rfl: 3   TRESIBA FLEXTOUCH 100 UNIT/ML FlexTouch Pen, INJECT 38 UNITS UNDER THE SKIN ONCE A DAY, Disp: 12 mL, Rfl: 3   losartan (COZAAR) 25 MG tablet, Take 1 tablet by mouth once a day, Disp: 90 tablet, Rfl: 3  Current Facility-Administered Medications:    Insulin Pen Needle (NOVOFINE) 10 each, 1 packet, Subcutaneous, QHS, ClTereasa CoopPA-C   Vital signs in last 24 hrs: Vitals:   06/20/21 0900  BP: 138/82  Pulse: 90  SpO2: 98%   Wt Readings from Last 3 Encounters:  06/20/21 231 lb (104.8 kg)  06/07/21 234 lb (106.1 kg)  04/27/21 229 lb (103.9 kg)    Physical Exam   HEENT: sclera anicteric, oral mucosa moist without lesions Neck: supple, no thyromegaly, JVD or lymphadenopathy Cardiac: RRR without murmurs, S1S2 heard, no peripheral edema Pulm: clear to auscultation bilaterally, normal RR and effort noted Abdomen: soft, no tenderness, with active bowel sounds. No guarding or palpable hepatosplenomegaly. Skin; warm and dry, no jaundice or rash Perianal exam reveals multiple fistula as before with both setons and a Penrose drain. The most superior left-sided fistulous opening has soft formed stool at its opening.  Labs:   ___________________________________________ Radiologic studies:   ____________________________________________ Other:   _____________________________________________ Assessment & Plan  Assessment: Encounter Diagnoses  Name Primary?   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  While I would not have expected symptomatic or visible improvement yet, I am glad to see that he does not have an apparent  recurrent abscess. Stool output from one of the fistula is new as far as I had seen him, and he and his wife also believe that has not previously occurred.  [name] has his second dose of Remicade tomorrow at 5 mg/kg, then with his week 6 dose I will increase it to 10 mg/kg.  He will see me in about 6 weeks, at which time we will most likely start the concomitant azathioprine.  We reviewed this plan and he and his wife are comfortable with that, all questions answered. He was due to see Dr. Dema Severin about 2 weeks ago, but it apparently had to be rescheduled until January.  I will copy this note and message Dr. Dema Severin to see if he feels a sooner appointment is warranted given the stool output from one of the fistula. Surgical intervention would be challenging in this patient given his multiple prior interventions, but in some circumstances a diverting loop ileostomy to divert fecal output may be warranted.  This would have to be balanced against the fact that we are only just at the beginning of this patient's Remicade treatment.  33 minutes were spent on this encounter (including chart review, history/exam, counseling/coordination of care, and documentation) > 50% of that time was spent on counseling and coordination of care.   [name]

## 2021-06-21 ENCOUNTER — Ambulatory Visit (INDEPENDENT_AMBULATORY_CARE_PROVIDER_SITE_OTHER): Payer: No Typology Code available for payment source

## 2021-06-21 ENCOUNTER — Other Ambulatory Visit: Payer: Self-pay

## 2021-06-21 VITALS — BP 167/97 | HR 99 | Temp 98.5°F | Resp 16 | Ht 70.0 in | Wt 235.6 lb

## 2021-06-21 DIAGNOSIS — K50114 Crohn's disease of large intestine with abscess: Secondary | ICD-10-CM

## 2021-06-21 MED ORDER — SODIUM CHLORIDE 0.9 % IV SOLN: Freq: Once | INTRAVENOUS | Status: DC | PRN

## 2021-06-21 MED ORDER — ACETAMINOPHEN 325 MG PO TABS
650.0000 mg | ORAL_TABLET | Freq: Once | ORAL | Status: AC
  Administered 2021-06-21: 650 mg via ORAL
  Filled 2021-06-21: qty 2

## 2021-06-21 MED ORDER — FAMOTIDINE IN NACL 20-0.9 MG/50ML-% IV SOLN: 20.0000 mg | Freq: Once | INTRAVENOUS | Status: DC | PRN

## 2021-06-21 MED ORDER — EPINEPHRINE 0.3 MG/0.3ML IJ SOAJ: 0.3000 mg | Freq: Once | INTRAMUSCULAR | Status: DC | PRN

## 2021-06-21 MED ORDER — DIPHENHYDRAMINE HCL 50 MG/ML IJ SOLN: 50.0000 mg | Freq: Once | INTRAMUSCULAR | Status: DC | PRN

## 2021-06-21 MED ORDER — HYDROCORTISONE NA SUCCINATE PF 100 MG IJ SOLR
100.0000 mg | Freq: Once | INTRAMUSCULAR | Status: AC
  Administered 2021-06-21: 100 mg via INTRAVENOUS
  Filled 2021-06-21: qty 2

## 2021-06-21 MED ORDER — ALBUTEROL SULFATE HFA 108 (90 BASE) MCG/ACT IN AERS: 2.0000 | INHALATION_SPRAY | Freq: Once | RESPIRATORY_TRACT | Status: DC | PRN

## 2021-06-21 MED ORDER — DIPHENHYDRAMINE HCL 25 MG PO CAPS
25.0000 mg | ORAL_CAPSULE | Freq: Once | ORAL | Status: AC
  Administered 2021-06-21: 25 mg via ORAL
  Filled 2021-06-21: qty 1

## 2021-06-21 MED ORDER — SODIUM CHLORIDE 0.9 % IV SOLN
5.0000 mg/kg | Freq: Once | INTRAVENOUS | Status: AC
  Administered 2021-06-21: 500 mg via INTRAVENOUS
  Filled 2021-06-21: qty 50

## 2021-06-21 MED ORDER — METHYLPREDNISOLONE SODIUM SUCC 125 MG IJ SOLR: 125.0000 mg | Freq: Once | INTRAMUSCULAR | Status: DC | PRN

## 2021-06-21 NOTE — Progress Notes (Signed)
Diagnosis: Crohn's Disease  Provider:  Marshell Garfinkel, MD  Procedure: Infusion  IV Type: Peripheral, IV Location: R Antecubital  Remicade (Infliximab), Dose: 500 mg  Infusion Start Time: 0942am  Infusion Stop Time: 9470  Post Infusion IV Care: Peripheral IV Discontinued  Discharge: Condition: Good, Destination: Home . AVS provided to patient.   Performed by:  Koren Shiver, RN

## 2021-06-25 NOTE — Progress Notes (Signed)
Diagnosis: Crohn's Disease  Provider:  Marshell Garfinkel, MD  Procedure: Infusion  IV Type: Peripheral, IV Location: R Antecubital  Remicade (Infliximab), Dose: 500 mg  Infusion Start Time: 0942am  Infusion Stop Time: 9861  Post Infusion IV Care: Peripheral IV Discontinued  Discharge: Condition: Good, Destination: Home . AVS provided to patient.   Performed by:  Koren Shiver, RN

## 2021-06-27 ENCOUNTER — Encounter: Payer: Self-pay | Admitting: Gastroenterology

## 2021-06-27 ENCOUNTER — Telehealth: Payer: Self-pay | Admitting: Pharmacy Technician

## 2021-06-27 NOTE — Telephone Encounter (Signed)
UPDATED PA FOR INCREASE DOSE 10MG/KG  Auth Submission: APPROVED Payer: [employer] Medication & CPT/J Code(s) submitted: Remicade (Infliximab) J1745 Route of submission (phone, fax, portal):  Auth type: Buy/Bill Units/visits requested: 4 VISITS - 10MG/KG Q8WKS Reference number: [id number] Approval from: 05/09/21 to 09-21-21

## 2021-06-27 NOTE — Telephone Encounter (Signed)
Yes, he will need a new therapy plan. Maudie Mercury

## 2021-07-19 ENCOUNTER — Ambulatory Visit (INDEPENDENT_AMBULATORY_CARE_PROVIDER_SITE_OTHER): Payer: No Typology Code available for payment source

## 2021-07-19 ENCOUNTER — Other Ambulatory Visit: Payer: Self-pay

## 2021-07-19 VITALS — BP 176/99 | HR 78 | Temp 98.2°F | Resp 18 | Ht 70.0 in | Wt 235.2 lb

## 2021-07-19 DIAGNOSIS — K50114 Crohn's disease of large intestine with abscess: Secondary | ICD-10-CM | POA: Diagnosis not present

## 2021-07-19 MED ORDER — DIPHENHYDRAMINE HCL 50 MG/ML IJ SOLN: 50.0000 mg | Freq: Once | INTRAMUSCULAR | Status: DC | PRN

## 2021-07-19 MED ORDER — DIPHENHYDRAMINE HCL 25 MG PO CAPS
25.0000 mg | ORAL_CAPSULE | Freq: Once | ORAL | Status: AC
  Administered 2021-07-19: 09:00:00 25 mg via ORAL
  Filled 2021-07-19: qty 1

## 2021-07-19 MED ORDER — METHYLPREDNISOLONE SODIUM SUCC 125 MG IJ SOLR: 125.0000 mg | Freq: Once | INTRAMUSCULAR | Status: DC | PRN

## 2021-07-19 MED ORDER — FAMOTIDINE IN NACL 20-0.9 MG/50ML-% IV SOLN: 20.0000 mg | Freq: Once | INTRAVENOUS | Status: DC | PRN

## 2021-07-19 MED ORDER — HYDROCORTISONE NA SUCCINATE PF 100 MG IJ SOLR
100.0000 mg | Freq: Once | INTRAMUSCULAR | Status: AC
  Administered 2021-07-19: 09:00:00 100 mg via INTRAVENOUS
  Filled 2021-07-19: qty 2

## 2021-07-19 MED ORDER — SODIUM CHLORIDE 0.9 % IV SOLN: Freq: Once | INTRAVENOUS | Status: DC | PRN

## 2021-07-19 MED ORDER — SODIUM CHLORIDE 0.9 % IV SOLN
1000.0000 mg | Freq: Once | INTRAVENOUS | Status: AC
  Administered 2021-07-19: 10:00:00 1000 mg via INTRAVENOUS
  Filled 2021-07-19: qty 100

## 2021-07-19 MED ORDER — EPINEPHRINE 0.3 MG/0.3ML IJ SOAJ: 0.3000 mg | Freq: Once | INTRAMUSCULAR | Status: DC | PRN

## 2021-07-19 MED ORDER — ACETAMINOPHEN 325 MG PO TABS
650.0000 mg | ORAL_TABLET | Freq: Once | ORAL | Status: AC
  Administered 2021-07-19: 09:00:00 650 mg via ORAL
  Filled 2021-07-19: qty 2

## 2021-07-19 MED ORDER — ALBUTEROL SULFATE HFA 108 (90 BASE) MCG/ACT IN AERS: 2.0000 | INHALATION_SPRAY | Freq: Once | RESPIRATORY_TRACT | Status: DC | PRN

## 2021-07-19 NOTE — Progress Notes (Signed)
Diagnosis: Crohn's Disease  Provider:  Marshell Garfinkel, MD  Procedure: Infusion  IV Type: Peripheral, IV Location: L Forearm  Remicade (Infliximab), Dose: 1000 mg  Infusion Start Time: 0940  Infusion Stop Time: 1228  Post Infusion IV Care: Peripheral IV Discontinued  Discharge: Condition: Good, Destination: Home . AVS provided to patient.   Performed by:  Paul Dykes, RN

## 2021-07-24 ENCOUNTER — Telehealth: Payer: Self-pay | Admitting: Gastroenterology

## 2021-07-24 NOTE — Telephone Encounter (Signed)
Dr. Loletha Carrow, please see note below. Pt had Remicade 15m/kg on 12/29.

## 2021-07-24 NOTE — Telephone Encounter (Signed)
Thank you for the note.  I spoke with Sean Schaefer by phone and he reports having had headache begin the day after the Remicade infusion (which was his first infusion at the 10 mg/kg dose), and a headache flow subsided and resolved within 2 days.  This afternoon he got blurred vision that he noticed while driving, it lasted a few hours and is now resolved.  He has not had fever chills, eye pain, dizziness, unsteadiness, numbness or weakness in his face arms hands, slurred speech or change in mental status.  He was at home when I spoke with him and he said he was feeling back to normal.  Blurred vision has been from the left eye, and when speaking to him this evening he closed one eye and then the other and realize thinks it slowly return to normal.  This had not happened with his previous Remicade infusions, and I explained that it would be an unusual side effect but certainly possible.  He is not prone to headaches in the past so this was unusual for him.  I asked if he had an optometrist, and says that he does, so I asked if he could call them tomorrow and try to be seen same day to make sure he has normal eye pressure and no other apparent acute eye issues.  He is due to see me in a few weeks and at that time we can determine whether continue same dose of Remicade or perhaps make a dose reduction.  He will keep [place] posted after his visit to the optometrist.  HD

## 2021-07-24 NOTE — Telephone Encounter (Signed)
Patients wife called stating that patient received infusion last Thursday and is having sluggish feelings, can't see well out of one of his eyes ( not aware which), headaches.  Wanting to inform Dr. Loletha Carrow.

## 2021-07-25 NOTE — Telephone Encounter (Signed)
Noted, thanks!

## 2021-07-30 ENCOUNTER — Other Ambulatory Visit (HOSPITAL_COMMUNITY): Payer: Self-pay

## 2021-07-31 ENCOUNTER — Other Ambulatory Visit (HOSPITAL_COMMUNITY): Payer: Self-pay

## 2021-07-31 MED ORDER — INSULIN LISPRO (1 UNIT DIAL) 100 UNIT/ML (KWIKPEN)
PEN_INJECTOR | SUBCUTANEOUS | 3 refills | Status: DC
  Filled 2021-07-31: qty 6, 25d supply, fill #0

## 2021-07-31 MED ORDER — TRESIBA FLEXTOUCH 100 UNIT/ML ~~LOC~~ SOPN
PEN_INJECTOR | SUBCUTANEOUS | 11 refills | Status: DC
  Filled 2021-07-31: qty 30, 75d supply, fill #0

## 2021-08-08 ENCOUNTER — Other Ambulatory Visit (HOSPITAL_COMMUNITY): Payer: Self-pay

## 2021-08-15 ENCOUNTER — Other Ambulatory Visit: Payer: Self-pay | Admitting: Pharmacy Technician

## 2021-08-15 ENCOUNTER — Telehealth: Payer: Self-pay | Admitting: Pharmacy Technician

## 2021-08-15 ENCOUNTER — Other Ambulatory Visit (HOSPITAL_COMMUNITY): Payer: Self-pay

## 2021-08-15 ENCOUNTER — Ambulatory Visit (INDEPENDENT_AMBULATORY_CARE_PROVIDER_SITE_OTHER): Payer: No Typology Code available for payment source | Admitting: Gastroenterology

## 2021-08-15 ENCOUNTER — Encounter: Payer: Self-pay | Admitting: Gastroenterology

## 2021-08-15 VITALS — BP 140/90 | HR 100 | Ht 69.0 in | Wt 228.1 lb

## 2021-08-15 DIAGNOSIS — Z7960 Long term (current) use of unspecified immunomodulators and immunosuppressants: Secondary | ICD-10-CM

## 2021-08-15 DIAGNOSIS — K50113 Crohn's disease of large intestine with fistula: Secondary | ICD-10-CM | POA: Diagnosis not present

## 2021-08-15 DIAGNOSIS — K6289 Other specified diseases of anus and rectum: Secondary | ICD-10-CM

## 2021-08-15 MED ORDER — AZATHIOPRINE 100 MG PO TABS
50.0000 mg | ORAL_TABLET | Freq: Every day | ORAL | 1 refills | Status: DC
  Filled 2021-08-15: qty 30, 30d supply, fill #0

## 2021-08-15 NOTE — Telephone Encounter (Signed)
Fyi note:  Confirmed: new PA is NOT required for decrease in dose. 56m/kg. Phone: [phone]# [id number] - Jeff-B

## 2021-08-15 NOTE — Patient Instructions (Addendum)
If you are age 54 or older, your body mass index should be between 23-30. Your Body mass index is 33.69 kg/m. If this is out of the aforementioned range listed, please consider follow up with your Primary Care Provider.  If you are age 97 or younger, your body mass index should be between 19-25. Your Body mass index is 33.69 kg/m. If this is out of the aformentioned range listed, please consider follow up with your Primary Care Provider.   ________________________________________________________  The [place] [facility] providers would like to encourage you to use [facility] to communicate with providers for non-urgent requests or questions.  Due to long hold times on the telephone, sending your provider a message by [facility] may be a faster and more efficient way to get a response.  Please allow 48 business hours for a response.  Please remember that this is for non-urgent requests.  _______________________________________________________  Your provider has requested that you go to the basement level for lab work in 10-14 days after starting Azathioprine. Press "B" on the elevator. The lab is located at the first door on the left as you exit the elevator. Hours of operation are 8am-5pm.  It was a pleasure to see you today!  Thank you for trusting me with your gastrointestinal care!

## 2021-08-15 NOTE — Progress Notes (Addendum)
[facility] Progress Note  Chief Complaint: Fistulizing perianal Crohn's disease and J-pouch  Subjective  History: Last visit 06/20/2021, perianal disease looks stable.  Plan was for surgical follow-up, continuing infliximab and most likely starting azathioprine at next visit.   He saw Dr. Dema Severin 08/07/2021 - no additional incision and drainage performed. Called 07/24/21 a few days after his first 10 mg/kg infliximab infusion, and complained of a headache and a few hours of blurred vision. ____________ [name] was with his wife today and says he is feeling about the same since I last saw him.  He describes having had increased buttock pain that he thought was another abscess occurring, but it spontaneously started draining around the time he saw Dr. Dema Severin recently.  He has not had any further headaches or visual disturbance.  He tells me that he saw an optometrist the day after I spoke with him, and he was frustrated that they had not sent their note to me as they indicated they would.  He recalls being told that his eye pressure was normal, they did dilate and look at the retina and he was told that was all fine as well.  He apparently was referred to an ophthalmologist for more detailed evaluation, he is not quite sure why and has not seen them yet.  His bowel habits remain about the same.  He denies fever chills night sweats or abdominal pain.  He was asking if we still plan to start the azathioprine as discussed at last visit.  Shai is understandably frustrated that his condition remains as severe as it is despite having started infliximab.  ROS: Cardiovascular:  no chest pain Respiratory: no dyspnea Remainder of systems negative except as above The patient's Past Medical, Family and Social History were reviewed and are on file in the EMR.  Objective:  Med list reviewed  Current Outpatient Medications:    acetaminophen (TYLENOL) 500 MG tablet, Take 2 tablets (1,000 mg total) by  mouth every 6 (six) hours as needed for mild pain., Disp: 30 tablet, Rfl: 0   azathioprine (IMURAN) 100 MG tablet, Take 1/2 tablet (50 mg total) by mouth daily., Disp: 60 tablet, Rfl: 1   blood glucose meter kit and supplies, Dispense based on patient and insurance preference. Use up to four times daily as directed. (FOR ICD-10 E10.9, E11.9)., Disp: 1 each, Rfl: 0   Continuous Blood Gluc Receiver (FREESTYLE LIBRE 14 DAY READER) DEVI, Use as directed, Disp: 1 each, Rfl: 2   glucose blood (FREESTYLE PRECISION NEO TEST) test strip, 1 each by Other route daily. Use Freestyle Precision Neo test strips as instructed to check blood sugar once daily., Disp: 50 each, Rfl: 3   inFLIXimab (REMICADE) 100 MG injection, [facility] - [facility] 1st and 2nd dose = 34m/kg 3rd and onward (q8wks) dose = 126mkg, Disp: 1 each, Rfl: 0   insulin degludec (TRESIBA FLEXTOUCH) 100 UNIT/ML FlexTouch Pen, Inject 40 units under the skin daily at bedtime, Disp: 30 mL, Rfl: 11   insulin lispro (HUMALOG KWIKPEN) 100 UNIT/ML KwikPen, Inject 8 units into the skin three times a day as directed (Patient taking differently: Inject 10 Units into the skin 3 (three) times daily.), Disp: 9 mL, Rfl: 3   losartan (COZAAR) 25 MG tablet, Take 1 tablet by mouth once a day, Disp: 90 tablet, Rfl: 3   rosuvastatin (CRESTOR) 10 MG tablet, Take 1 tablet by mouth once daily, Disp: 90 tablet, Rfl: 3  Current Facility-Administered  Medications:    Insulin Pen Needle (NOVOFINE) 10 each, 1 packet, Subcutaneous, QHS, Tereasa Coop, PA-C   Vital signs in last 24 hrs: Vitals:   08/15/21 0919  BP: 140/90  Pulse: 100   Wt Readings from Last 3 Encounters:  08/15/21 228 lb 2 oz (103.5 kg)  07/19/21 235 lb 3.2 oz (106.7 kg)  06/21/21 235 lb 9.6 oz (106.9 kg)    Physical Exam  He is not acutely ill-appearing.  Able to sit in a chair without much discomfort. HEENT: sclera anicteric, oral mucosa moist without lesions Neck: supple, no  thyromegaly, JVD or lymphadenopathy Cardiac: RRR without murmurs, S1S2 heard, no peripheral edema Pulm: clear to auscultation bilaterally, normal RR and effort noted Abdomen: soft, no tenderness, with active bowel sounds. No guarding or palpable hepatosplenomegaly. Skin; warm and dry, no jaundice or rash Perianal exam reveals fistulizing perianal Crohn's as before, looks stable from last visit with me.  Setons and a left buttock Penrose drain in place.  Scant drainage expressed from that spot.  Labs:   ___________________________________________ Radiologic studies:   ____________________________________________ Other:   _____________________________________________ Assessment & Plan  Assessment: Encounter Diagnoses  Name Primary?   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  No additional incision and drainage needed with last week's visit at Dr. Orest Dikes office.  I am hopeful that anti-TNF therapy will get control of this, but I am apprehensive about having him continue the 10 mg/kg dose given what happened after his last infusion.  It sounds like it may have triggered something like a complex migraine, but I would not like to risk that happening again.  He did well with the 5 mg/kg infusions, so I am plan to decrease to that dose with his treatment at the end of February.  We will get a trough Remicade and antibody level the day prior to that next infusion. If antibodies negative (which most likely will be since he had not previously taken infliximab), and if drug level is therapeutic, but we are still not getting better control of this, he needs a consultation with an IBD specialist at [facility] or [facility].  If level is low, will change dosing to every 4  weeks.  Start azathioprine 100 mg tablets, 1/2 tablet daily.  Check CBC/differential and hepatic function panel in 10 to 14 days (orders placed).  If stable, slowly increase dose. Risks reviewed of added immune suppression, pancreatitis, small bowel malignancy in conjunction with anti-TNF therapy, nonmelanoma skin cancer.   40 minutes were spent on this encounter (including chart review, history/exam, counseling/coordination of care, and documentation) > 50% of that time was spent on counseling and coordination of care.   [name]  Addendum  I communicated with Tresa Res, RPh, the pharmacist overseeing the [place] infusion center, after Mr. [place] office visit today.  He has changed the Remicade dose for the upcoming infusion back to 5 mg/kg  Madelon Lips, MD  __________________________  Second addendum date 08/16/2021  After considering Mr. Gates case further at yesterday's visit, I have decided to put him on a course of antibiotics for any residual soft tissue infection that may be present, especially considering his history of abscesses.  I spoke with him on the  phone and he was agreeable.  Ciprofloxacin 500 mg, 1 tablet twice daily for 7 days Metronidazole 500 mg, 1 tablet twice daily for 14 days (start both antibiotics at the same time)  Prescription sent to [facility].  Madelon Lips, MD

## 2021-08-16 ENCOUNTER — Other Ambulatory Visit: Payer: Self-pay | Admitting: Gastroenterology

## 2021-08-16 ENCOUNTER — Other Ambulatory Visit (HOSPITAL_COMMUNITY): Payer: Self-pay

## 2021-08-16 DIAGNOSIS — K611 Rectal abscess: Secondary | ICD-10-CM

## 2021-08-16 MED ORDER — CIPROFLOXACIN HCL 500 MG PO TABS
500.0000 mg | ORAL_TABLET | Freq: Two times a day (BID) | ORAL | 0 refills | Status: AC
  Filled 2021-08-16: qty 14, 7d supply, fill #0

## 2021-08-16 MED ORDER — METRONIDAZOLE 500 MG PO TABS
500.0000 mg | ORAL_TABLET | Freq: Two times a day (BID) | ORAL | 0 refills | Status: AC
  Filled 2021-08-16: qty 28, 14d supply, fill #0

## 2021-08-19 ENCOUNTER — Encounter: Payer: Self-pay | Admitting: Gastroenterology

## 2021-08-27 ENCOUNTER — Other Ambulatory Visit (INDEPENDENT_AMBULATORY_CARE_PROVIDER_SITE_OTHER): Payer: No Typology Code available for payment source

## 2021-08-27 DIAGNOSIS — K50113 Crohn's disease of large intestine with fistula: Secondary | ICD-10-CM | POA: Diagnosis not present

## 2021-08-27 DIAGNOSIS — K6289 Other specified diseases of anus and rectum: Secondary | ICD-10-CM | POA: Diagnosis not present

## 2021-08-27 DIAGNOSIS — Z7960 Long term (current) use of unspecified immunomodulators and immunosuppressants: Secondary | ICD-10-CM

## 2021-08-27 LAB — CBC WITH DIFFERENTIAL/PLATELET
Basophils Absolute: 0 10*3/uL (ref 0.0–0.1)
Basophils Relative: 0.6 % (ref 0.0–3.0)
Eosinophils Absolute: 0.3 10*3/uL (ref 0.0–0.7)
Eosinophils Relative: 4.5 % (ref 0.0–5.0)
HCT: 46.5 % (ref 39.0–52.0)
Hemoglobin: 15.5 g/dL (ref 13.0–17.0)
Lymphocytes Relative: 23.3 % (ref 12.0–46.0)
Lymphs Abs: 1.7 10*3/uL (ref 0.7–4.0)
MCHC: 33.4 g/dL (ref 30.0–36.0)
MCV: 87.3 fl (ref 78.0–100.0)
Monocytes Absolute: 0.6 10*3/uL (ref 0.1–1.0)
Monocytes Relative: 8.2 % (ref 3.0–12.0)
Neutro Abs: 4.6 10*3/uL (ref 1.4–7.7)
Neutrophils Relative %: 63.4 % (ref 43.0–77.0)
Platelets: 197 10*3/uL (ref 150.0–400.0)
RBC: 5.33 Mil/uL (ref 4.22–5.81)
RDW: 13.2 % (ref 11.5–15.5)
WBC: 7.3 10*3/uL (ref 4.0–10.5)

## 2021-08-27 LAB — HEPATIC FUNCTION PANEL
ALT: 14 U/L (ref 0–53)
AST: 16 U/L (ref 0–37)
Albumin: 3.6 g/dL (ref 3.5–5.2)
Alkaline Phosphatase: 57 U/L (ref 39–117)
Bilirubin, Direct: 0.1 mg/dL (ref 0.0–0.3)
Total Bilirubin: 0.8 mg/dL (ref 0.2–1.2)
Total Protein: 7.8 g/dL (ref 6.0–8.3)

## 2021-08-29 ENCOUNTER — Other Ambulatory Visit (HOSPITAL_COMMUNITY): Payer: Self-pay

## 2021-08-29 ENCOUNTER — Other Ambulatory Visit: Payer: Self-pay

## 2021-08-29 DIAGNOSIS — Z7960 Long term (current) use of unspecified immunomodulators and immunosuppressants: Secondary | ICD-10-CM

## 2021-08-29 DIAGNOSIS — K50113 Crohn's disease of large intestine with fistula: Secondary | ICD-10-CM

## 2021-08-29 MED ORDER — AZATHIOPRINE 100 MG PO TABS
100.0000 mg | ORAL_TABLET | Freq: Every day | ORAL | 1 refills | Status: DC
  Filled 2021-08-29: qty 30, 30d supply, fill #0

## 2021-08-31 ENCOUNTER — Telehealth: Payer: Self-pay | Admitting: Pharmacy Technician

## 2021-08-31 ENCOUNTER — Telehealth: Payer: Self-pay | Admitting: Gastroenterology

## 2021-08-31 NOTE — Telephone Encounter (Signed)
Returned call to patient's wife. She states that she just talked with her husband and he told her that she overreacted. Pt's wife said to disregard. I told her to call back if they have any other concerns. Pt's wife verbalized understanding and had no concerns at the end of the call.

## 2021-08-31 NOTE — Telephone Encounter (Signed)
Inbound call from patient's wife requesting to speak with nurse please in regards to patient stating since increased dose of Imuran medication, he has been experiencing issues.  Please advise.

## 2021-08-31 NOTE — Telephone Encounter (Signed)
Auth Submission:  PA RENEWAL PENDING Payer: [employer] Medication & CPT/J Code(s) submitted: Remicade (Infliximab) J1745 Route of submission (phone, fax, portal): PHONE: [place]: [phone] Auth type: Buy/Bill Units/visits requested: 5MG/KG Q8WKS Reference number:    Will update once we receive a response.

## 2021-09-03 NOTE — Telephone Encounter (Signed)
Auth Submission: approved Payer: medwatch Medication & CPT/J Code(s) submitted: Remicade (Infliximab) J1745 Route of submission (phone, fax, portal):  Phone [phone] Auth type: Buy/Bill Units/visits requested: 518m q8wks Reference number: [id number] from: 09/10/21 to 09/10/22

## 2021-09-11 ENCOUNTER — Telehealth: Payer: Self-pay

## 2021-09-11 NOTE — Telephone Encounter (Signed)
Spoke with patient to remind him that he is due for labs at this time. No appointment is necessary. Patient is aware that he can stop by the lab in the basement at his convenience between 7:30 AM - 5 PM today or tomorrow. Pt knows that he will need these labs prior to his infusion on 2/23. Patient verbalized understanding and had no concerns at the end of the call.

## 2021-09-11 NOTE — Telephone Encounter (Signed)
-----   Message from Yevette Edwards, RN sent at 08/29/2021 11:55 AM EST ----- Regarding: Labs CBC/diff, hepatic function panel, Infliximab drug and antibody level due on 2/21 or 2/22. Orders are in epic.

## 2021-09-12 ENCOUNTER — Other Ambulatory Visit (INDEPENDENT_AMBULATORY_CARE_PROVIDER_SITE_OTHER): Payer: No Typology Code available for payment source

## 2021-09-12 DIAGNOSIS — Z7960 Long term (current) use of unspecified immunomodulators and immunosuppressants: Secondary | ICD-10-CM | POA: Diagnosis not present

## 2021-09-12 DIAGNOSIS — K50113 Crohn's disease of large intestine with fistula: Secondary | ICD-10-CM

## 2021-09-12 LAB — HEPATIC FUNCTION PANEL
ALT: 10 U/L (ref 0–53)
AST: 14 U/L (ref 0–37)
Albumin: 3.7 g/dL (ref 3.5–5.2)
Alkaline Phosphatase: 63 U/L (ref 39–117)
Bilirubin, Direct: 0.2 mg/dL (ref 0.0–0.3)
Total Bilirubin: 1.1 mg/dL (ref 0.2–1.2)
Total Protein: 8.2 g/dL (ref 6.0–8.3)

## 2021-09-12 LAB — CBC WITH DIFFERENTIAL/PLATELET
Basophils Absolute: 0.1 10*3/uL (ref 0.0–0.1)
Basophils Relative: 0.8 % (ref 0.0–3.0)
Eosinophils Absolute: 0.2 10*3/uL (ref 0.0–0.7)
Eosinophils Relative: 3.2 % (ref 0.0–5.0)
HCT: 45.6 % (ref 39.0–52.0)
Hemoglobin: 15.4 g/dL (ref 13.0–17.0)
Lymphocytes Relative: 21.2 % (ref 12.0–46.0)
Lymphs Abs: 1.4 10*3/uL (ref 0.7–4.0)
MCHC: 33.8 g/dL (ref 30.0–36.0)
MCV: 87.6 fl (ref 78.0–100.0)
Monocytes Absolute: 0.6 10*3/uL (ref 0.1–1.0)
Monocytes Relative: 9.2 % (ref 3.0–12.0)
Neutro Abs: 4.5 10*3/uL (ref 1.4–7.7)
Neutrophils Relative %: 65.6 % (ref 43.0–77.0)
Platelets: 193 10*3/uL (ref 150.0–400.0)
RBC: 5.21 Mil/uL (ref 4.22–5.81)
RDW: 13.8 % (ref 11.5–15.5)
WBC: 6.8 10*3/uL (ref 4.0–10.5)

## 2021-09-13 ENCOUNTER — Other Ambulatory Visit: Payer: Self-pay

## 2021-09-13 ENCOUNTER — Ambulatory Visit (INDEPENDENT_AMBULATORY_CARE_PROVIDER_SITE_OTHER): Payer: No Typology Code available for payment source

## 2021-09-13 VITALS — BP 157/99 | HR 88 | Temp 98.4°F | Resp 20 | Ht 70.0 in | Wt 224.2 lb

## 2021-09-13 DIAGNOSIS — K50114 Crohn's disease of large intestine with abscess: Secondary | ICD-10-CM

## 2021-09-13 MED ORDER — DIPHENHYDRAMINE HCL 25 MG PO CAPS
25.0000 mg | ORAL_CAPSULE | Freq: Once | ORAL | Status: AC
  Administered 2021-09-13: 25 mg via ORAL
  Filled 2021-09-13: qty 1

## 2021-09-13 MED ORDER — HYDROCORTISONE NA SUCCINATE PF 100 MG IJ SOLR
100.0000 mg | Freq: Once | INTRAMUSCULAR | Status: AC
  Administered 2021-09-13: 100 mg via INTRAVENOUS
  Filled 2021-09-13: qty 2

## 2021-09-13 MED ORDER — SODIUM CHLORIDE 0.9 % IV SOLN
5.0000 mg/kg | Freq: Once | INTRAVENOUS | Status: AC
  Administered 2021-09-13: 500 mg via INTRAVENOUS
  Filled 2021-09-13: qty 50

## 2021-09-13 MED ORDER — ACETAMINOPHEN 325 MG PO TABS
650.0000 mg | ORAL_TABLET | Freq: Once | ORAL | Status: AC
  Administered 2021-09-13: 650 mg via ORAL
  Filled 2021-09-13: qty 2

## 2021-09-13 NOTE — Progress Notes (Signed)
Diagnosis: Crohn's Disease  Provider:  Marshell Garfinkel, MD  Procedure: Infusion  IV Type: Peripheral, IV Location: R Antecubital  Remicade (Infliximab), Dose: 500 mg  Infusion Start Time: 0920  Infusion Stop Time: 1140  Post Infusion IV Care: Peripheral IV Discontinued  Discharge: Condition: Good, Destination: Home . AVS provided to patient.   Performed by:  Paul Dykes, RN

## 2021-09-14 ENCOUNTER — Other Ambulatory Visit (HOSPITAL_COMMUNITY): Payer: Self-pay

## 2021-09-14 ENCOUNTER — Other Ambulatory Visit: Payer: Self-pay

## 2021-09-14 DIAGNOSIS — Z7960 Long term (current) use of unspecified immunomodulators and immunosuppressants: Secondary | ICD-10-CM

## 2021-09-14 DIAGNOSIS — K50113 Crohn's disease of large intestine with fistula: Secondary | ICD-10-CM

## 2021-09-14 MED ORDER — AZATHIOPRINE 100 MG PO TABS
150.0000 mg | ORAL_TABLET | Freq: Every day | ORAL | 0 refills | Status: DC
  Filled 2021-09-14: qty 45, 30d supply, fill #0

## 2021-09-14 NOTE — Addendum Note (Signed)
Addended by: Yevette Edwards on: 09/14/2021 03:17 PM   Modules accepted: Orders

## 2021-09-17 ENCOUNTER — Other Ambulatory Visit (HOSPITAL_COMMUNITY): Payer: Self-pay

## 2021-09-18 ENCOUNTER — Other Ambulatory Visit (HOSPITAL_COMMUNITY): Payer: Self-pay

## 2021-09-20 ENCOUNTER — Other Ambulatory Visit (HOSPITAL_COMMUNITY): Payer: Self-pay

## 2021-09-21 ENCOUNTER — Other Ambulatory Visit: Payer: Self-pay

## 2021-09-21 ENCOUNTER — Encounter: Payer: Self-pay | Admitting: Gastroenterology

## 2021-09-21 LAB — INFLIXIMAB+AB (SERIAL MONITOR)
Anti-Infliximab Antibody: <22 ng/mL
Infliximab Drug Level: 1.7 ug/mL

## 2021-09-21 LAB — SERIAL MONITORING

## 2021-09-21 MED ORDER — INFLIXIMAB 100 MG IV SOLR: INTRAVENOUS | 0 refills | Status: DC

## 2021-09-21 NOTE — Addendum Note (Signed)
Addended by: Yevette Edwards on: 09/21/2021 04:01 PM   Modules accepted: Orders

## 2021-09-24 ENCOUNTER — Encounter: Payer: Self-pay | Admitting: Gastroenterology

## 2021-09-24 NOTE — Addendum Note (Signed)
Addended by: Terence Lux on: 09/24/2021 08:55 AM ? ? Modules accepted: Orders ? ?

## 2021-09-26 ENCOUNTER — Telehealth: Payer: Self-pay

## 2021-09-26 NOTE — Telephone Encounter (Signed)
-----   Message from Yevette Edwards, RN sent at 09/14/2021  3:16 PM EST ----- ?Regarding: Labs ?CBC/diff and hepatic function panel, orders are in epic.  ?Due this week ? ?

## 2021-09-26 NOTE — Telephone Encounter (Signed)
Left pt a detailed vm regarding lab reminder. Pt has been advised that no appt is necessary and he will need to stop by the lab within the next day or 2. Advised pt of lab hours and location.I told pt to call back or send a my chart message if he has any questions or concerns. ?

## 2021-09-27 ENCOUNTER — Other Ambulatory Visit (INDEPENDENT_AMBULATORY_CARE_PROVIDER_SITE_OTHER): Payer: No Typology Code available for payment source

## 2021-09-27 DIAGNOSIS — K50113 Crohn's disease of large intestine with fistula: Secondary | ICD-10-CM | POA: Diagnosis not present

## 2021-09-27 DIAGNOSIS — Z7960 Long term (current) use of unspecified immunomodulators and immunosuppressants: Secondary | ICD-10-CM

## 2021-09-27 LAB — CBC WITH DIFFERENTIAL/PLATELET
Basophils Absolute: 0 10*3/uL (ref 0.0–0.1)
Basophils Relative: 0.3 % (ref 0.0–3.0)
Eosinophils Absolute: 0.2 10*3/uL (ref 0.0–0.7)
Eosinophils Relative: 3 % (ref 0.0–5.0)
HCT: 44.1 % (ref 39.0–52.0)
Hemoglobin: 14.8 g/dL (ref 13.0–17.0)
Lymphocytes Relative: 32 % (ref 12.0–46.0)
Lymphs Abs: 1.9 10*3/uL (ref 0.7–4.0)
MCHC: 33.6 g/dL (ref 30.0–36.0)
MCV: 89.2 fl (ref 78.0–100.0)
Monocytes Absolute: 0.4 10*3/uL (ref 0.1–1.0)
Monocytes Relative: 7.1 % (ref 3.0–12.0)
Neutro Abs: 3.4 10*3/uL (ref 1.4–7.7)
Neutrophils Relative %: 57.6 % (ref 43.0–77.0)
Platelets: 302 10*3/uL (ref 150.0–400.0)
RBC: 4.94 Mil/uL (ref 4.22–5.81)
RDW: 13.5 % (ref 11.5–15.5)
WBC: 5.9 10*3/uL (ref 4.0–10.5)

## 2021-09-27 LAB — HEPATIC FUNCTION PANEL
ALT: 9 U/L (ref 0–53)
AST: 15 U/L (ref 0–37)
Albumin: 3.6 g/dL (ref 3.5–5.2)
Alkaline Phosphatase: 64 U/L (ref 39–117)
Bilirubin, Direct: 0 mg/dL (ref 0.0–0.3)
Total Bilirubin: 0.5 mg/dL (ref 0.2–1.2)
Total Protein: 8.3 g/dL (ref 6.0–8.3)

## 2021-10-11 ENCOUNTER — Ambulatory Visit (INDEPENDENT_AMBULATORY_CARE_PROVIDER_SITE_OTHER): Payer: No Typology Code available for payment source

## 2021-10-11 ENCOUNTER — Other Ambulatory Visit: Payer: Self-pay

## 2021-10-11 VITALS — BP 169/95 | HR 88 | Temp 98.0°F | Resp 18 | Ht 70.0 in | Wt 223.0 lb

## 2021-10-11 DIAGNOSIS — K50114 Crohn's disease of large intestine with abscess: Secondary | ICD-10-CM | POA: Diagnosis not present

## 2021-10-11 MED ORDER — DIPHENHYDRAMINE HCL 25 MG PO CAPS
25.0000 mg | ORAL_CAPSULE | Freq: Once | ORAL | Status: AC
  Administered 2021-10-11: 25 mg via ORAL
  Filled 2021-10-11: qty 1

## 2021-10-11 MED ORDER — SODIUM CHLORIDE 0.9 % IV SOLN
5.0000 mg/kg | INTRAVENOUS | Status: DC
  Administered 2021-10-11: 500 mg via INTRAVENOUS
  Filled 2021-10-11: qty 50

## 2021-10-11 MED ORDER — ACETAMINOPHEN 325 MG PO TABS
650.0000 mg | ORAL_TABLET | Freq: Once | ORAL | Status: AC
  Administered 2021-10-11: 650 mg via ORAL
  Filled 2021-10-11: qty 2

## 2021-10-11 MED ORDER — HYDROCORTISONE NA SUCCINATE PF 100 MG IJ SOLR
100.0000 mg | Freq: Once | INTRAMUSCULAR | Status: AC
  Administered 2021-10-11: 100 mg via INTRAVENOUS
  Filled 2021-10-11: qty 2

## 2021-10-11 NOTE — Progress Notes (Signed)
Diagnosis: Crohn's Disease ? ?Provider:  Marshell Garfinkel, MD ? ?Procedure: Infusion ? ?IV Type: Peripheral, IV Location: R Antecubital ? ?Remicade (Infliximab), Dose: 500 mg ? ?Infusion Start Time: [phone] ? ?Infusion Stop Time: 1210 ? ?Post Infusion IV Care: Observation period completed ? ?Discharge: Condition: Good, Destination: Home . AVS provided to patient.  ? ?Performed by:  Paul Dykes, RN  ?  ?

## 2021-10-15 ENCOUNTER — Telehealth: Payer: Self-pay | Admitting: Pharmacy Technician

## 2021-10-15 NOTE — Telephone Encounter (Signed)
[employer] Remicde co-pay card: Pending ?Patient has been enrolled in the co-pay assist. ?Awaiting response. ?Phone; [phone] ?Fax: [phone] ?

## 2021-10-17 NOTE — Telephone Encounter (Addendum)
Remicade co-pay card: APPROVED ID# [id number] GR# [id number] BIN# [id number] MAX; $ 20,000/YR Card# [id number] Exp: 3/26 Cvv: 201  Left v/m with patient in regards to co-pay card info, awaiting call back

## 2021-10-23 ENCOUNTER — Ambulatory Visit: Payer: No Typology Code available for payment source | Admitting: Gastroenterology

## 2021-10-29 ENCOUNTER — Other Ambulatory Visit: Payer: Self-pay

## 2021-10-29 ENCOUNTER — Other Ambulatory Visit (HOSPITAL_COMMUNITY): Payer: Self-pay

## 2021-10-29 MED ORDER — AZATHIOPRINE 100 MG PO TABS
150.0000 mg | ORAL_TABLET | Freq: Every day | ORAL | 2 refills | Status: DC
  Filled 2021-10-29: qty 6, 4d supply, fill #0
  Filled 2021-10-29: qty 39, 26d supply, fill #0
  Filled 2021-11-29: qty 45, 30d supply, fill #1
  Filled 2021-12-31: qty 45, 30d supply, fill #2

## 2021-10-30 ENCOUNTER — Other Ambulatory Visit (HOSPITAL_COMMUNITY): Payer: Self-pay

## 2021-11-01 ENCOUNTER — Ambulatory Visit (INDEPENDENT_AMBULATORY_CARE_PROVIDER_SITE_OTHER): Payer: No Typology Code available for payment source | Admitting: Gastroenterology

## 2021-11-01 DIAGNOSIS — K50113 Crohn's disease of large intestine with fistula: Secondary | ICD-10-CM

## 2021-11-01 DIAGNOSIS — Z23 Encounter for immunization: Secondary | ICD-10-CM

## 2021-11-08 ENCOUNTER — Ambulatory Visit (INDEPENDENT_AMBULATORY_CARE_PROVIDER_SITE_OTHER): Payer: No Typology Code available for payment source

## 2021-11-08 VITALS — BP 151/92 | HR 83 | Temp 98.4°F | Resp 18 | Ht 70.0 in | Wt 219.6 lb

## 2021-11-08 DIAGNOSIS — K50114 Crohn's disease of large intestine with abscess: Secondary | ICD-10-CM

## 2021-11-08 MED ORDER — HYDROCORTISONE NA SUCCINATE PF 100 MG IJ SOLR
100.0000 mg | Freq: Once | INTRAMUSCULAR | Status: AC
  Administered 2021-11-08: 100 mg via INTRAVENOUS
  Filled 2021-11-08: qty 2

## 2021-11-08 MED ORDER — DIPHENHYDRAMINE HCL 25 MG PO CAPS
25.0000 mg | ORAL_CAPSULE | Freq: Once | ORAL | Status: AC
  Administered 2021-11-08: 25 mg via ORAL
  Filled 2021-11-08: qty 1

## 2021-11-08 MED ORDER — ACETAMINOPHEN 325 MG PO TABS
650.0000 mg | ORAL_TABLET | Freq: Once | ORAL | Status: AC
  Administered 2021-11-08: 650 mg via ORAL
  Filled 2021-11-08: qty 2

## 2021-11-08 MED ORDER — SODIUM CHLORIDE 0.9 % IV SOLN
5.0000 mg/kg | INTRAVENOUS | Status: DC
  Administered 2021-11-08: 500 mg via INTRAVENOUS
  Filled 2021-11-08: qty 50

## 2021-11-08 NOTE — Progress Notes (Signed)
Diagnosis: Crohn's Disease ? ?Provider:  Marshell Garfinkel, MD ? ?Procedure: Infusion ? ?IV Type: Peripheral, IV Location: R Forearm ? ?Remicade (Infliximab), Dose: 500 mg ? ?Infusion Start Time: [phone] ? ?Infusion Stop Time: 1210 ? ?Post Infusion IV Care: Peripheral IV Discontinued ? ?Discharge: Condition: Good, Destination: Home . AVS provided to patient.  ? ?Performed by:  Paul Dykes, RN  ?  ?

## 2021-11-26 ENCOUNTER — Other Ambulatory Visit: Payer: Self-pay

## 2021-11-29 ENCOUNTER — Other Ambulatory Visit (HOSPITAL_COMMUNITY): Payer: Self-pay

## 2021-11-30 ENCOUNTER — Other Ambulatory Visit (HOSPITAL_COMMUNITY): Payer: Self-pay

## 2021-12-04 ENCOUNTER — Encounter: Payer: Self-pay | Admitting: Gastroenterology

## 2021-12-04 ENCOUNTER — Ambulatory Visit (INDEPENDENT_AMBULATORY_CARE_PROVIDER_SITE_OTHER): Payer: No Typology Code available for payment source | Admitting: Gastroenterology

## 2021-12-04 VITALS — BP 158/80 | HR 90 | Ht 69.0 in | Wt 221.4 lb

## 2021-12-04 DIAGNOSIS — Z7960 Long term (current) use of unspecified immunomodulators and immunosuppressants: Secondary | ICD-10-CM

## 2021-12-04 DIAGNOSIS — K6289 Other specified diseases of anus and rectum: Secondary | ICD-10-CM

## 2021-12-04 DIAGNOSIS — K50113 Crohn's disease of large intestine with fistula: Secondary | ICD-10-CM

## 2021-12-04 NOTE — Patient Instructions (Addendum)
If you are age 54 or older, your body mass index should be between 23-30. Your Body mass index is 32.69 kg/m?Marland Kitchen If this is out of the aforementioned range listed, please consider follow up with your Primary Care Provider. ? ?If you are age 46 or younger, your body mass index should be between 19-25. Your Body mass index is 32.69 kg/m?Marland Kitchen If this is out of the aformentioned range listed, please consider follow up with your Primary Care Provider.  ? ?________________________________________________________ ? ?The [facility] providers would like to encourage you to use [facility] to communicate with providers for non-urgent requests or questions.  Due to long hold times on the telephone, sending your provider a message by [facility] may be a faster and more efficient way to get a response.  Please allow 48 business hours for a response.  Please remember that this is for non-urgent requests.  ?_______________________________________________________ ? ?You have been scheduled for a flexible sigmoidoscopy. Please follow the written instructions given to you at your visit today. ?If you use inhalers (even only as needed), please bring them with you on the day of your procedure. ? ?Your provider has requested that you go to the basement level for lab work on Thursday. Press "B" on the elevator. The lab is located at the first door on the left as you exit the elevator.  ? ?You have been scheduled for an MRI/enteroscopy  12-12-21 at 12. Please arrive at 10:30am. Please arrive to admitting (at main entrance of the hospital). Please make certain not to have anything to eat or drink 4 hours prior to your test. In addition, if you have any metal in your body, have a pacemaker or defibrillator, please be sure to let your ordering physician know. This test typically takes 45 minutes to 1 hour to complete. Should you need to reschedule, please call [phone] to do so.  ? ?It was a pleasure to see you today! ? ?Thank you for trusting me  with your gastrointestinal care!   ? ? ? ?

## 2021-12-04 NOTE — Progress Notes (Signed)
? ? ? ?[facility] Progress Note ? ?Chief Complaint: Fistulizing perianal Crohn's pouchitis ? ?Subjective  ?History: ? ?I last saw Sean Schaefer in the office January 25.  He had just had another buttock abscess that drained spontaneously and was treated with antibiotics, evaluated by Dr. Dema Severin at [place]. ?We have tried 10 mg/kg infliximab, but the one infusion at that dose caused visual changes that resolved in a few days and he required evaluation by optometry. ?Subsequent infliximab level low at 1.7 with negative antibodies, thus he was changed to 5 mg/kg every 4 weeks. ?He was also started on azathioprine at 50 mg daily and gradually increase to 150 mg daily. ?Last CT abdomen September 2022 when found to have a perirectal abscess.  Last CT enterography 2015 ?Up-to-date on vaccinations. ? ?His bowel habits remain unchanged, with several semiformed to loose BMs per day.  He has perianal pain from the setons, no swelling or discharge.  There is an occasional spot of blood from one spot when he cleans after a BM.  No fever or chills. ?Tolerating every 4-week Remicade infusions, his third 1 is this week. ? ?ROS: ?Cardiovascular:  no chest pain ?Respiratory: no dyspnea ? ?The patient's Past Medical, Family and Social History were reviewed and are on file in the EMR. ? ?Objective: ? ?Med list reviewed ? ?Current Outpatient Medications:  ?  acetaminophen (TYLENOL) 500 MG tablet, Take 2 tablets (1,000 mg total) by mouth every 6 (six) hours as needed for mild pain., Disp: 30 tablet, Rfl: 0 ?  azathioprine (IMURAN) 100 MG tablet, Take 1 and 1/2 tablets (150 mg total) by mouth daily., Disp: 45 tablet, Rfl: 2 ?  blood glucose meter kit and supplies, Dispense based on patient and insurance preference. Use up to four times daily as directed. (FOR ICD-10 E10.9, E11.9)., Disp: 1 each, Rfl: 0 ?  Continuous Blood Gluc Receiver (FREESTYLE LIBRE 14 DAY READER) DEVI, Use as directed, Disp: 1 each, Rfl: 2 ?  glucose blood (FREESTYLE  PRECISION NEO TEST) test strip, 1 each by Other route daily. Use Freestyle Precision Neo test strips as instructed to check blood sugar once daily., Disp: 50 each, Rfl: 3 ?  inFLIXimab (REMICADE) 100 MG injection, REMICADE - [facility] [address] 57m/kg every 4 weeks, Disp: 1 each, Rfl: 0 ?  insulin degludec (TRESIBA FLEXTOUCH) 100 UNIT/ML FlexTouch Pen, Inject 40 units under the skin daily at bedtime, Disp: 30 mL, Rfl: 11 ?  insulin lispro (HUMALOG KWIKPEN) 100 UNIT/ML KwikPen, Inject 8 units into the skin three times a day as directed (Patient taking differently: Inject 10 Units into the skin 3 (three) times daily.), Disp: 9 mL, Rfl: 3 ?  losartan (COZAAR) 25 MG tablet, Take 1 tablet by mouth once a day, Disp: 90 tablet, Rfl: 3 ?  rosuvastatin (CRESTOR) 10 MG tablet, Take 1 tablet by mouth once daily, Disp: 90 tablet, Rfl: 3 ? ?Current Facility-Administered Medications:  ?  Insulin Pen Needle (NOVOFINE) 10 each, 1 packet, Subcutaneous, QHS, [facility] [employer] PA-C ? ? ?Vital signs in last 24 hrs: ?Vitals:  ? 12/04/21 0808  ?BP: (!) 158/80  ?Pulse: 90  ? ?Wt Readings from Last 3 Encounters:  ?12/04/21 221 lb 6 oz (100.4 kg)  ?11/08/21 219 lb 9.6 oz (99.6 kg)  ?10/11/21 223 lb (101.2 kg)  ?  ?Physical Exam ? ?Well-appearing ?HEENT: sclera anicteric, oral mucosa moist without lesions ?Neck: supple, no thyromegaly, JVD or lymphadenopathy ?Cardiac: RRR without murmurs, S1S2 heard, no peripheral edema ?Pulm:  clear to auscultation bilaterally, normal RR and effort noted ?Abdomen: soft, no tenderness, with active bowel sounds. No guarding or palpable hepatosplenomegaly.  Multiple scar ?Skin; warm and dry, no jaundice or rash ?Perianal exam reveals one seton and another Penrose drain material loop in a left buttock fistulous opening.  Scarring present, no erythema fluctuance tenderness or expressed drainage. ?Labs: ? ? ?  Latest Ref Rng & Units 09/27/2021  ? 12:19 PM 09/12/2021  ?  1:25 PM 08/27/2021  ? 11:00 AM  ?CBC   ?WBC 4.0 - 10.5 K/uL 5.9   6.8   7.3    ?Hemoglobin 13.0 - 17.0 g/dL 14.8   15.4   15.5    ?Hematocrit 39.0 - 52.0 % 44.1   45.6   46.5    ?Platelets 150.0 - 400.0 K/uL 302.0   193.0   197.0    ? ? ?  Latest Ref Rng & Units 09/27/2021  ? 12:19 PM 09/12/2021  ?  1:25 PM 08/27/2021  ? 11:00 AM  ?CMP  ?Total Protein 6.0 - 8.3 g/dL 8.3   8.2   7.8    ?Total Bilirubin 0.2 - 1.2 mg/dL 0.5   1.1   0.8    ?Alkaline Phos 39 - 117 U/L 64   63   57    ?AST 0 - 37 U/L _0 ?ALT 0 - 53 U/L _1 ? ? ?___________________________________________ ?Radiologic studies: ? ? ?____________________________________________ ?Other: ? ? ?_____________________________________________ ?Assessment & Plan  ?Assessment: ?Encounter Diagnoses  ?Name Primary?  ? 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 Long-term use of immunosuppressant medication   ? Perianal pain   ? ?He appears to be under the best control I have seen since establishing care here with me last year.  No abscess or need for surgical intervention in several months.  He is tolerating azathioprine and current dose of Remicade well.  He still has localized buttock and perianal pain from the chronic inflammation and scarring as well as 2 setons in place. ? ?We need to reassess his disease activity and monitor labs due to the azathioprine ?Plan: ?Cbc/cmp, drug level this week ?Pouchoscopy scheduled.  He was agreeable after discussion of procedure and risks. ? The benefits and risks of the planned procedure were described in detail with the patient or (when appropriate) their health care proxy.  Risks were outlined as including, but not limited to, bleeding, infection, perforation, adverse medication reaction leading to cardiac or pulmonary decompensation, pancreatitis (if ERCP).  The limitation of incomplete mucosal visualization was also discussed.  No guarantees or warranties were given. ? ? ?MR enterography with MRI pelvis ? ?With those results, we  then determine if we continue current treatment plan or if he needs a referral to a pouchitis/IBD specialist at [facility] or [facility]. ? ?33 minutes were spent on this encounter (including chart review, history/exam, counseling/coordination of care, and documentation) > 50% of that time was spent on counseling and coordination of care.  ? ?[name] ? ?

## 2021-12-06 ENCOUNTER — Ambulatory Visit (INDEPENDENT_AMBULATORY_CARE_PROVIDER_SITE_OTHER): Payer: No Typology Code available for payment source

## 2021-12-06 ENCOUNTER — Other Ambulatory Visit (INDEPENDENT_AMBULATORY_CARE_PROVIDER_SITE_OTHER): Payer: No Typology Code available for payment source

## 2021-12-06 VITALS — BP 166/101 | HR 82 | Temp 98.1°F | Resp 16 | Ht 69.0 in | Wt 220.8 lb

## 2021-12-06 DIAGNOSIS — K50114 Crohn's disease of large intestine with abscess: Secondary | ICD-10-CM | POA: Diagnosis not present

## 2021-12-06 DIAGNOSIS — Z7960 Long term (current) use of unspecified immunomodulators and immunosuppressants: Secondary | ICD-10-CM

## 2021-12-06 DIAGNOSIS — K6289 Other specified diseases of anus and rectum: Secondary | ICD-10-CM | POA: Diagnosis not present

## 2021-12-06 DIAGNOSIS — K50113 Crohn's disease of large intestine with fistula: Secondary | ICD-10-CM | POA: Diagnosis not present

## 2021-12-06 LAB — CBC WITH DIFFERENTIAL/PLATELET
Basophils Absolute: 0 10*3/uL (ref 0.0–0.1)
Basophils Relative: 0.9 % (ref 0.0–3.0)
Eosinophils Absolute: 0.2 10*3/uL (ref 0.0–0.7)
Eosinophils Relative: 4.3 % (ref 0.0–5.0)
HCT: 41.3 % (ref 39.0–52.0)
Hemoglobin: 14.7 g/dL (ref 13.0–17.0)
Lymphocytes Relative: 31 % (ref 12.0–46.0)
Lymphs Abs: 1.3 10*3/uL (ref 0.7–4.0)
MCHC: 35.6 g/dL (ref 30.0–36.0)
MCV: 93.7 fl (ref 78.0–100.0)
Monocytes Absolute: 0.4 10*3/uL (ref 0.1–1.0)
Monocytes Relative: 8.6 % (ref 3.0–12.0)
Neutro Abs: 2.3 10*3/uL (ref 1.4–7.7)
Neutrophils Relative %: 55.2 % (ref 43.0–77.0)
Platelets: 252 10*3/uL (ref 150.0–400.0)
RBC: 4.41 Mil/uL (ref 4.22–5.81)
RDW: 14.4 % (ref 11.5–15.5)
WBC: 4.2 10*3/uL (ref 4.0–10.5)

## 2021-12-06 LAB — HEPATIC FUNCTION PANEL
ALT: 25 U/L (ref 0–53)
AST: 45 U/L — ABNORMAL HIGH (ref 0–37)
Albumin: 3.7 g/dL (ref 3.5–5.2)
Alkaline Phosphatase: 110 U/L (ref 39–117)
Bilirubin, Direct: 0 mg/dL (ref 0.0–0.3)
Total Bilirubin: 0.5 mg/dL (ref 0.2–1.2)
Total Protein: 7.7 g/dL (ref 6.0–8.3)

## 2021-12-06 MED ORDER — SODIUM CHLORIDE 0.9 % IV SOLN
5.0000 mg/kg | INTRAVENOUS | Status: DC
  Administered 2021-12-06: 500 mg via INTRAVENOUS
  Filled 2021-12-06: qty 50

## 2021-12-06 MED ORDER — DIPHENHYDRAMINE HCL 25 MG PO CAPS
25.0000 mg | ORAL_CAPSULE | Freq: Once | ORAL | Status: AC
  Administered 2021-12-06: 25 mg via ORAL
  Filled 2021-12-06: qty 1

## 2021-12-06 MED ORDER — ACETAMINOPHEN 325 MG PO TABS
650.0000 mg | ORAL_TABLET | Freq: Once | ORAL | Status: AC
  Administered 2021-12-06: 650 mg via ORAL
  Filled 2021-12-06: qty 2

## 2021-12-06 MED ORDER — HYDROCORTISONE NA SUCCINATE PF 100 MG IJ SOLR
100.0000 mg | Freq: Once | INTRAMUSCULAR | Status: AC
  Administered 2021-12-06: 100 mg via INTRAVENOUS
  Filled 2021-12-06: qty 2

## 2021-12-06 NOTE — Progress Notes (Signed)
Diagnosis: Crohn's Disease  Provider:  Marshell Garfinkel, MD  Procedure: Infusion  IV Type: Peripheral, IV Location: R Antecubital  Remicade (Infliximab), Dose: 500 mg  Infusion Start Time: 0919  Infusion Stop Time: 9688  Post Infusion IV Care: Peripheral IV Discontinued  Discharge: Condition: Good, Destination: Home . AVS provided to patient.   Performed by:  Adelina Mings, LPN

## 2021-12-12 ENCOUNTER — Ambulatory Visit (HOSPITAL_COMMUNITY)
Admission: RE | Admit: 2021-12-12 | Discharge: 2021-12-12 | Disposition: A | Discharge status: Home / Self Care | Payer: No Typology Code available for payment source | Source: Ambulatory Visit | Attending: Gastroenterology | Admitting: Gastroenterology

## 2021-12-12 DIAGNOSIS — K6289 Other specified diseases of anus and rectum: Secondary | ICD-10-CM

## 2021-12-12 DIAGNOSIS — Z7960 Long term (current) use of unspecified immunomodulators and immunosuppressants: Secondary | ICD-10-CM

## 2021-12-12 DIAGNOSIS — K50113 Crohn's disease of large intestine with fistula: Secondary | ICD-10-CM | POA: Insufficient documentation

## 2021-12-12 IMAGING — MR MR ENTEROGRAPHY W/ CM
20 of 24 series · 40 of 48 positions shown · IV contrast (10 GADAVIST)
Comparison: CT of the pelvis from [DATE] and CT of the
abdomen and pelvis from [DATE].
COMPARISON: CT of the pelvis from [DATE] and CT of the
abdomen and pelvis from [DATE].

Addendum:
CLINICAL DATA: Crohn's disease with history of fistula.

EXAM:
MR ABDOMEN AND PELVIS WITHOUT AND WITH CONTRAST (MR ENTEROGRAPHY)
TECHNIQUE: Multiplanar, multisequence MRI of the abdomen and pelvis was
performed both before and during bolus administration of intravenous
contrast. Negative oral contrast VoLumen was given.
CONTRAST:  10mL GADAVIST GADOBUTROL 1 MMOL/ML IV SOLN

[Series 2: DWI · axial · 6.0mm · 1.64mm/px · z∈[-264,+117]mm · 3 of 108 slices shown (1 of 2)]
[im 1/108]
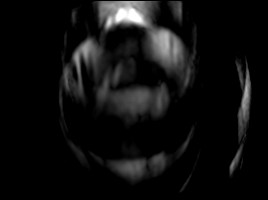
[im 54/108]
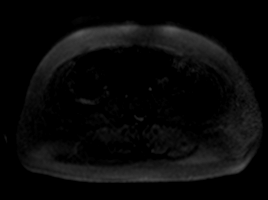
[im 108/108]
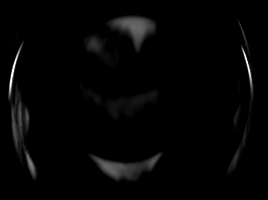

[Series 3: DWI · axial · 6.0mm · 1.64mm/px · z∈[-264,+117]mm · 2 of 54 slices shown (2 of 2)]
[im 1/54]
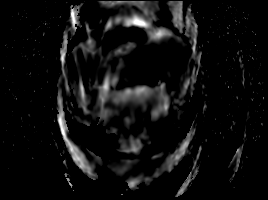
[im 54/54]
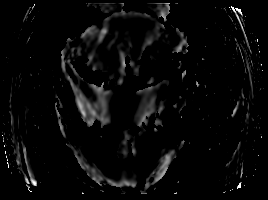

[Series 5: T2 · axial · 6.0mm · 0.90mm/px · z∈[-244,+142]mm · 2 of 56 slices shown (1 of 2)]
[im 1/56]
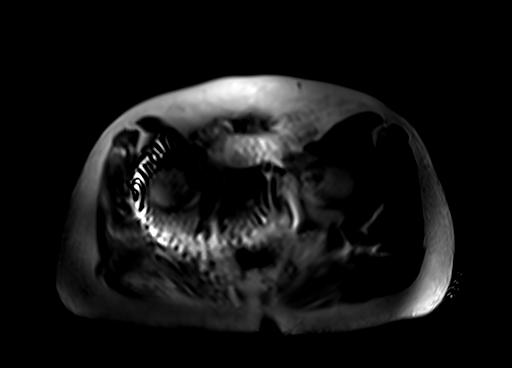
[im 56/56]
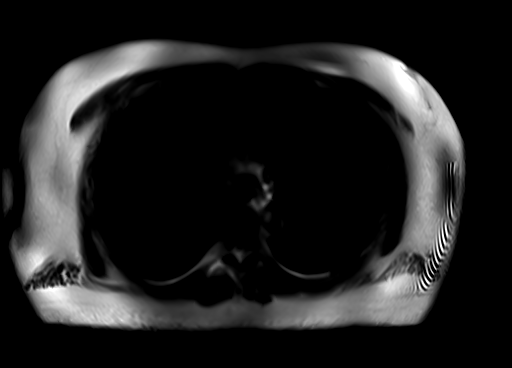

[Series 6: T2 · coronal · 6.0mm · 1.66mm/px · 2 of 42 slices shown (2 of 2)]
[im 1/42]
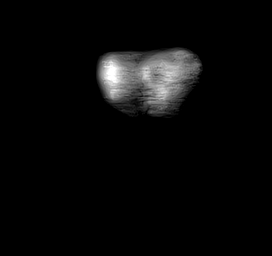
[im 42/42]
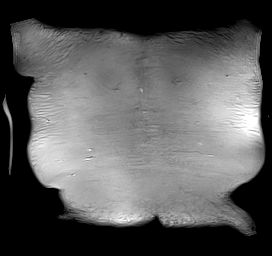

[Series 7: bSSFP · coronal · 6.0mm · 0.86mm/px · 2 of 34 slices shown]
[im 1/34]
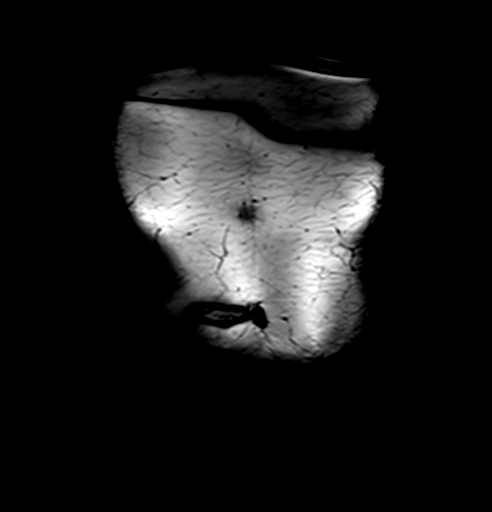
[im 34/34]
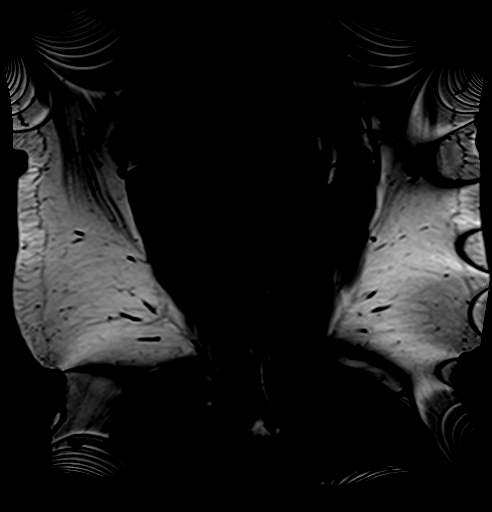

[Series 8: T1 · axial · 3.4mm · 1.56mm/px · z∈[-264,+113]mm · 2 of 112 slices shown (1 of 2)]
[im 1/112]
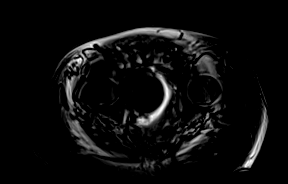
[im 112/112]
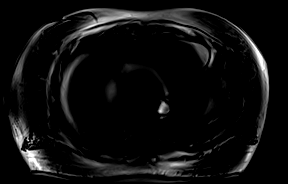

[Series 9: T1 · axial · 3.4mm · 1.56mm/px · z∈[-264,+113]mm · 2 of 112 slices shown (2 of 2)]
[im 1/112]
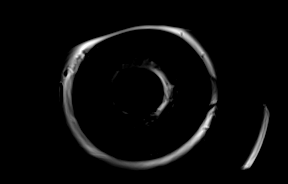
[im 112/112]
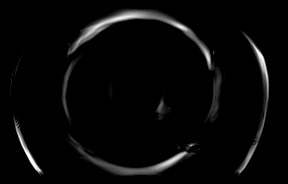

[Series 11: T1 dynamic · axial · 3.4mm · 1.56mm/px · z∈[-238,+112]mm · 2 of 104 slices shown (1 of 11)]
[im 1/104]
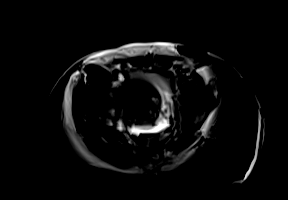
[im 104/104]
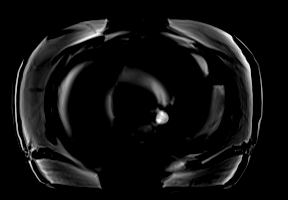

[Series 15: T1 dynamic · axial · 3.4mm · 1.56mm/px · z∈[-238,+112]mm · 2 of 104 slices shown (2 of 11)]
[im 1/104]
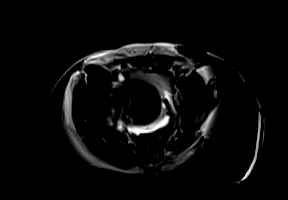
[im 104/104]
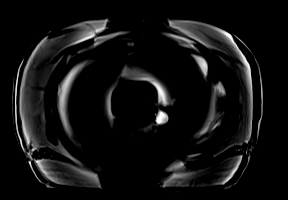

[Series 16: T1 dynamic · axial · 3.4mm · 1.56mm/px · z∈[-238,+112]mm · 2 of 104 slices shown (3 of 11)]
[im 1/104]
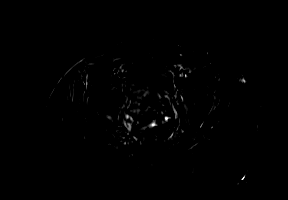
[im 104/104]
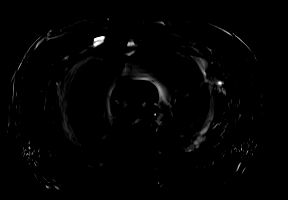

[Series 19: T1 dynamic · axial · 3.4mm · 1.56mm/px · z∈[-238,+112]mm · 2 of 104 slices shown (4 of 11)]
[im 1/104]
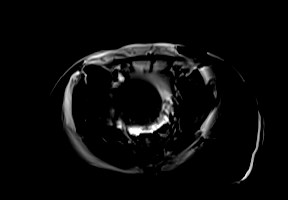
[im 104/104]
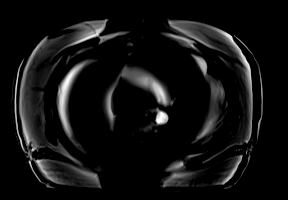

[Series 20: T1 dynamic · axial · 3.4mm · 1.56mm/px · z∈[-238,+112]mm · 2 of 104 slices shown (5 of 11)]
[im 1/104]
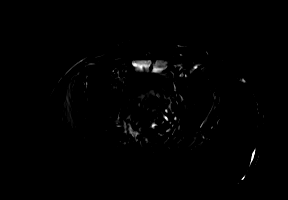
[im 104/104]
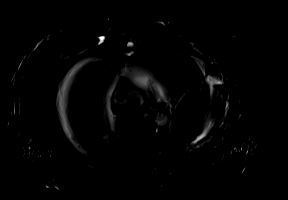

[Series 23: T1 dynamic · axial · 3.4mm · 1.56mm/px · z∈[-238,+112]mm · 2 of 104 slices shown (6 of 11)]
[im 1/104]
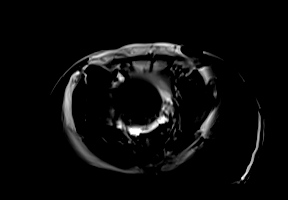
[im 104/104]
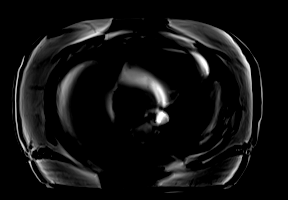

[Series 24: T1 dynamic · axial · 3.4mm · 1.56mm/px · z∈[-238,+112]mm · 2 of 104 slices shown (7 of 11)]
[im 1/104]
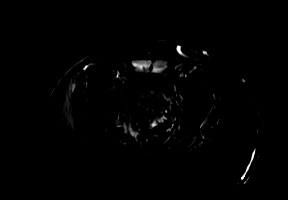
[im 104/104]
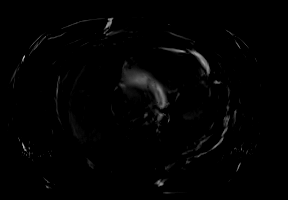

[Series 26: T1 dynamic · coronal · 5.0mm · 1.41mm/px · 1 of 52 slices shown (8 of 11)]
[im 1/52]
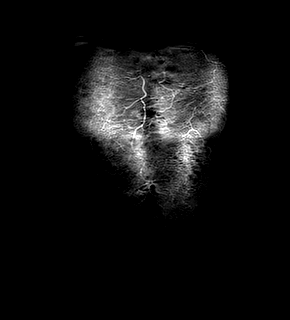

[Series 28: T1 dynamic · axial · 3.4mm · 1.56mm/px · z∈[-238,+112]mm · 2 of 104 slices shown (9 of 11)]
[im 1/104]
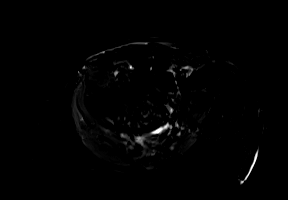
[im 104/104]
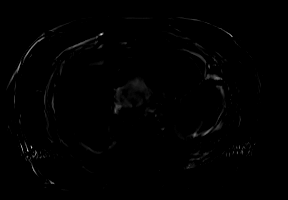

[Series 29: T1 dynamic · axial · 3.4mm · 1.56mm/px · z∈[-238,+112]mm · 2 of 104 slices shown (10 of 11)]
[im 1/104]
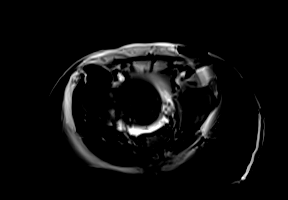
[im 104/104]
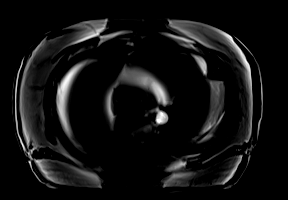

[Series 30: T1 dynamic · axial · 3.4mm · 1.56mm/px · z∈[-238,+112]mm · 2 of 104 slices shown (11 of 11)]
[im 1/104]
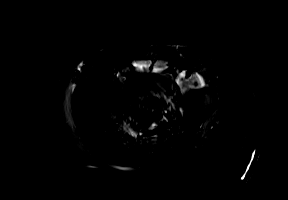
[im 104/104]
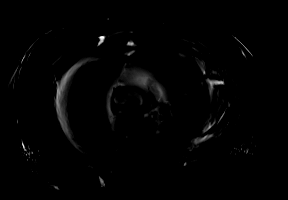

[Series 31: cor cine true-fisp · coronal · 10.0mm · 0.74mm/px · 2 of 100 slices shown (1 of 2)]
[im 1/100]
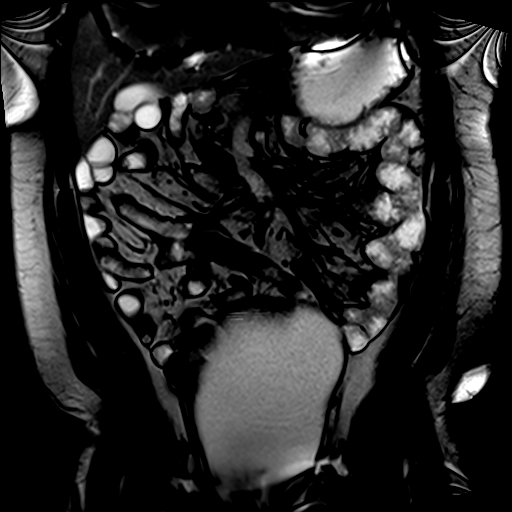
[im 100/100]
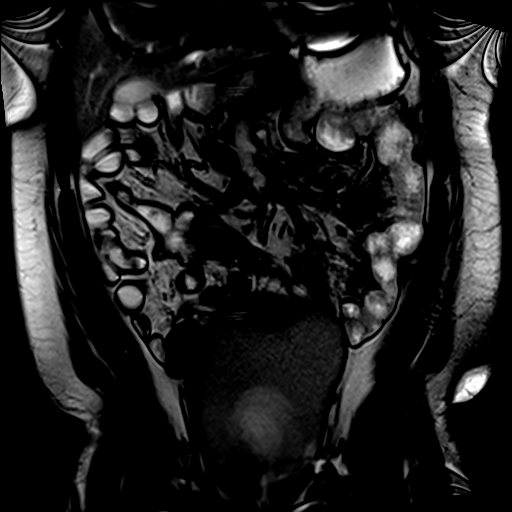

[Series 32: cor cine true-fisp · coronal · 10.0mm · 0.74mm/px · 2 of 100 slices shown (2 of 2)]
[im 1/100]
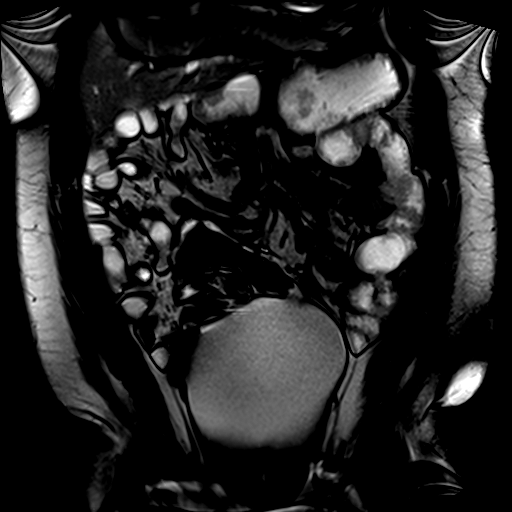
[im 100/100]
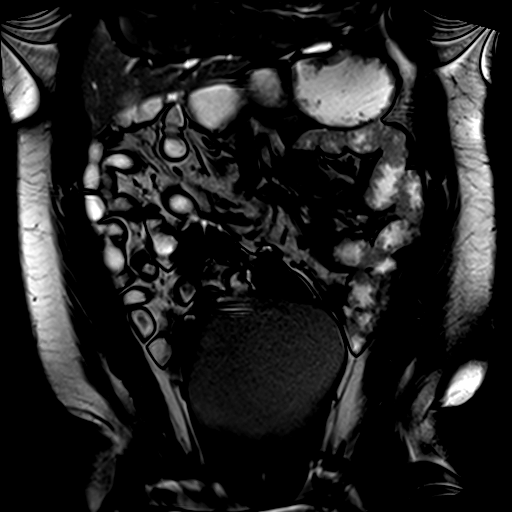

[40 of 48 positions shown; findings below may reference images not displayed]

FINDINGS: COMBINED FINDINGS FOR BOTH MR ABDOMEN AND PELVIS

Lower chest: Incidental imaging of the lung bases is unremarkable to
the extent evaluated.

Hepatobiliary: No visible hepatic lesion. The liver coverage limited
by field of view constraints. The portal vein is patent.

Pancreas: Normal intrinsic T1 signal. No ductal dilation or sign of
inflammation. No focal lesion.

Spleen:  Normal.

Adrenals/Urinary Tract:  Adrenal glands are normal.

Symmetric renal enhancement without hydronephrosis or suspicious
renal lesion.

Urinary bladder moderately distended extending into the low abdomen.

Stomach/Bowel: Post colectomy with ileoanal pouch anastomosis, not
well evaluated and shown to have inflammation on previous imaging.
Field of view constraints limited today's study. No gross
inflammation surrounding small bowel loops in the abdomen and no
sign of obstruction. Stool fills the pouch as visualized but there
is some stranding that is suspected to enter the presacral space in
the pelvis in this patient with signs of prior pouch inflammation.

Vascular/Lymphatic: No adenopathy in the abdomen or visualized
pelvis. Patent abdominal vessels without aneurysm of the abdominal
aorta.

Reproductive: No not assessed.

Other:  No ascites.

Musculoskeletal: No acute findings to the extent evaluated. There is
however some irregularity in the presacral space of the pelvis that
is not well assessed currently in this patient with pouch
inflammation on previous imaging from [4B] and extension of
inflammation into the perianal areas.
IMPRESSION: 1. Suspect continued inflammation of the patient's ileal pouch,
potentially extending into the presacral region. Area not well
assessed currently and the patient is being recalled for dedicated
imaging of the pelvis extending into the upper thigh to fully
assessed the perineum.
2. No gross inflammation surrounding small bowel loops in the
abdomen and no sign of obstruction.
3. No ascites.

ADDENDUM:
The patient is being recalled for dedicated pelvic fistula protocol
to further evaluate perianal disease.

*** End of Addendum ***
FINDINGS: COMBINED FINDINGS FOR BOTH MR ABDOMEN AND PELVIS

Lower chest: Incidental imaging of the lung bases is unremarkable to
the extent evaluated.

Hepatobiliary: No visible hepatic lesion. The liver coverage limited
by field of view constraints. The portal vein is patent.

Pancreas: Normal intrinsic T1 signal. No ductal dilation or sign of
inflammation. No focal lesion.

Spleen:  Normal.

Adrenals/Urinary Tract:  Adrenal glands are normal.

Symmetric renal enhancement without hydronephrosis or suspicious
renal lesion.

Urinary bladder moderately distended extending into the low abdomen.

Stomach/Bowel: Post colectomy with ileoanal pouch anastomosis, not
well evaluated and shown to have inflammation on previous imaging.
Field of view constraints limited today's study. No gross
inflammation surrounding small bowel loops in the abdomen and no
sign of obstruction. Stool fills the pouch as visualized but there
is some stranding that is suspected to enter the presacral space in
the pelvis in this patient with signs of prior pouch inflammation.

Vascular/Lymphatic: No adenopathy in the abdomen or visualized
pelvis. Patent abdominal vessels without aneurysm of the abdominal
aorta.

Reproductive: No not assessed.

Other:  No ascites.

Musculoskeletal: No acute findings to the extent evaluated. There is
however some irregularity in the presacral space of the pelvis that
is not well assessed currently in this patient with pouch
inflammation on previous imaging from [4B] and extension of
inflammation into the perianal areas.
IMPRESSION: 1. Suspect continued inflammation of the patient's ileal pouch,
potentially extending into the presacral region. Area not well
assessed currently and the patient is being recalled for dedicated
imaging of the pelvis extending into the upper thigh to fully
assessed the perineum.
2. No gross inflammation surrounding small bowel loops in the
abdomen and no sign of obstruction.
3. No ascites.

## 2021-12-12 IMAGING — MR MR ^MR ENTEROGRAPHY W/WO
19 of 23 series · 40 of 48 positions shown · IV contrast (10 GADAVIST)
Comparison: CT of the pelvis from [DATE] and CT of the
abdomen and pelvis from [DATE].
COMPARISON: CT of the pelvis from [DATE] and CT of the
abdomen and pelvis from [DATE].

Addendum:
CLINICAL DATA: Crohn's disease with history of fistula.

EXAM:
MR ABDOMEN AND PELVIS WITHOUT AND WITH CONTRAST (MR ENTEROGRAPHY)
TECHNIQUE: Multiplanar, multisequence MRI of the abdomen and pelvis was
performed both before and during bolus administration of intravenous
contrast. Negative oral contrast VoLumen was given.
CONTRAST:  10mL GADAVIST GADOBUTROL 1 MMOL/ML IV SOLN

[Series 3: DWI · axial · 6.0mm · 1.64mm/px · z∈[-264,+117]mm · 3 of 108 slices shown (1 of 2)]
[im 1/108]
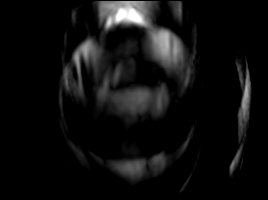
[im 54/108]
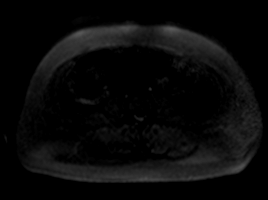
[im 108/108]
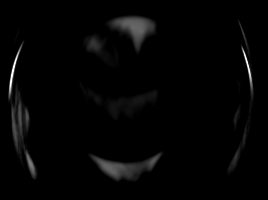

[Series 4: DWI · axial · 6.0mm · 1.64mm/px · z∈[-264,+117]mm · 2 of 54 slices shown (2 of 2)]
[im 1/54]
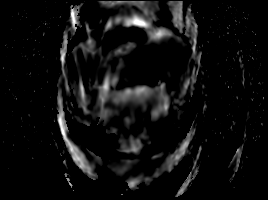
[im 54/54]
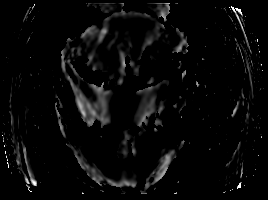

[Series 6: T2 · axial · 6.0mm · 0.90mm/px · z∈[-244,+142]mm · 2 of 56 slices shown (1 of 2)]
[im 1/56]
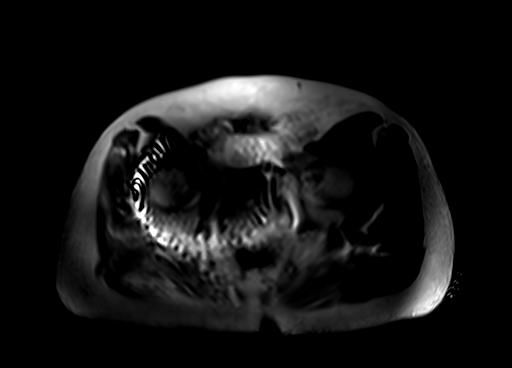
[im 56/56]
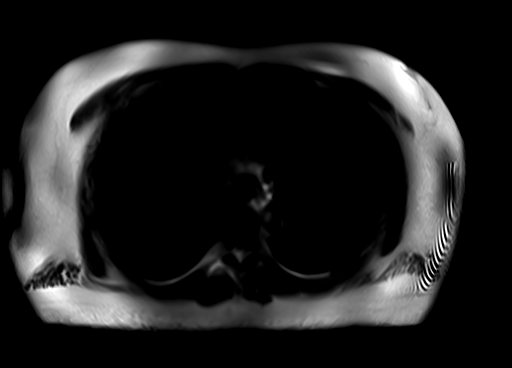

[Series 7: T2 · coronal · 6.0mm · 1.66mm/px · 2 of 42 slices shown (2 of 2)]
[im 1/42]
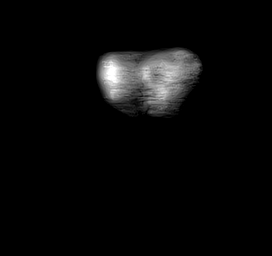
[im 42/42]
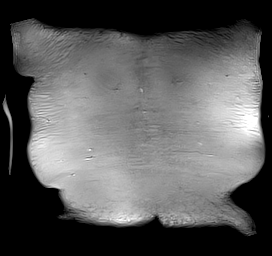

[Series 8: bSSFP · coronal · 6.0mm · 0.86mm/px · 2 of 34 slices shown]
[im 1/34]
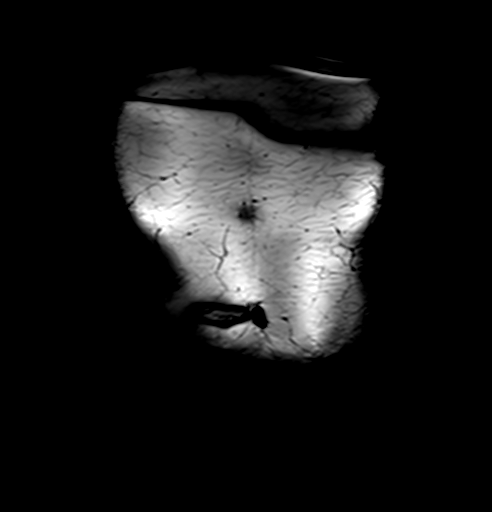
[im 34/34]
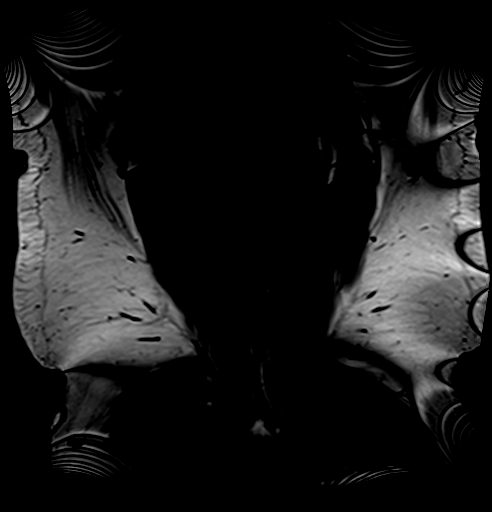

[Series 9: T1 · axial · 3.4mm · 1.56mm/px · z∈[-264,+113]mm · 3 of 112 slices shown (1 of 2)]
[im 1/112]
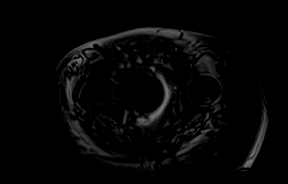
[im 56/112]
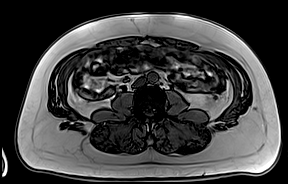
[im 112/112]
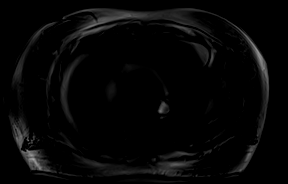

[Series 10: T1 · axial · 3.4mm · 1.56mm/px · z∈[-264,+113]mm · 3 of 112 slices shown (2 of 2)]
[im 1/112]
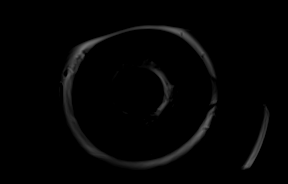
[im 56/112]
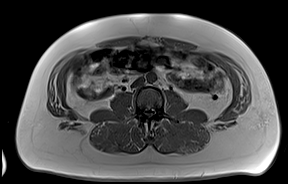
[im 112/112]
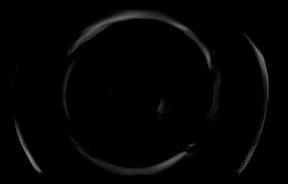

[Series 12: T1 dynamic · axial · 3.4mm · 1.56mm/px · z∈[-238,+112]mm · 2 of 104 slices shown (1 of 10)]
[im 1/104]
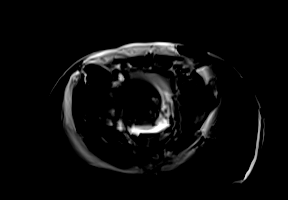
[im 104/104]
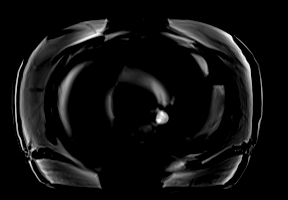

[Series 16: T1 dynamic · axial · 3.4mm · 1.56mm/px · z∈[-238,+112]mm · 2 of 104 slices shown (2 of 10)]
[im 1/104]
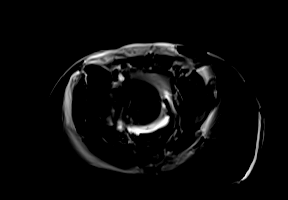
[im 104/104]
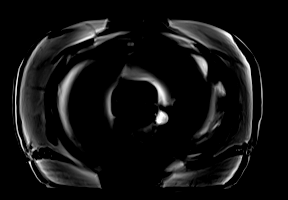

[Series 17: T1 dynamic · axial · 3.4mm · 1.56mm/px · z∈[-238,+112]mm · 2 of 104 slices shown (3 of 10)]
[im 1/104]
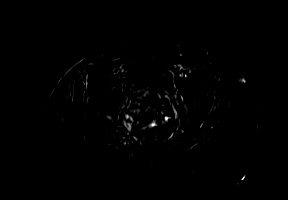
[im 104/104]
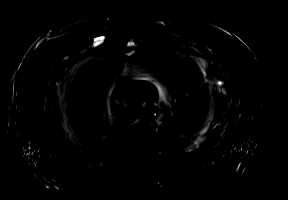

[Series 20: T1 dynamic · axial · 3.4mm · 1.56mm/px · z∈[-238,+112]mm · 2 of 104 slices shown (4 of 10)]
[im 1/104]
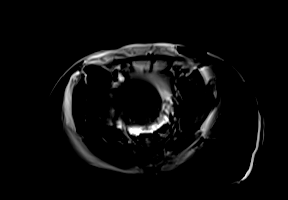
[im 104/104]
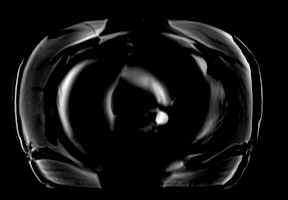

[Series 21: T1 dynamic · axial · 3.4mm · 1.56mm/px · z∈[-238,+112]mm · 2 of 104 slices shown (5 of 10)]
[im 1/104]
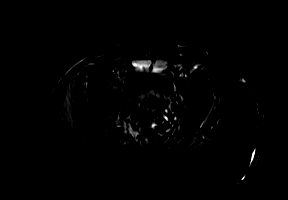
[im 104/104]
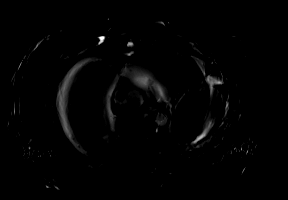

[Series 24: T1 dynamic · axial · 3.4mm · 1.56mm/px · z∈[-238,+112]mm · 2 of 104 slices shown (6 of 10)]
[im 1/104]
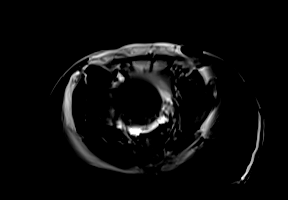
[im 104/104]
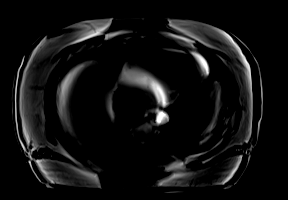

[Series 25: T1 dynamic · axial · 3.4mm · 1.56mm/px · z∈[-238,+112]mm · 2 of 104 slices shown (7 of 10)]
[im 1/104]
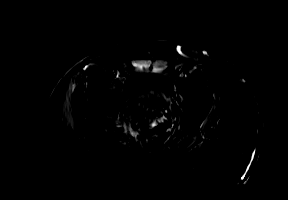
[im 104/104]
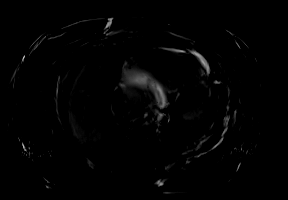

[Series 27: T1 dynamic · coronal · 5.0mm · 1.41mm/px · 1 of 52 slices shown (8 of 10)]
[im 1/52]
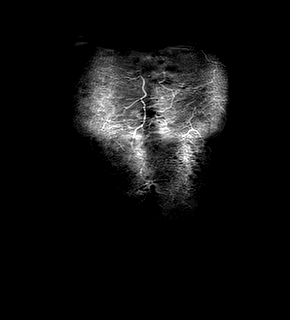

[Series 30: T1 dynamic · axial · 3.4mm · 1.56mm/px · z∈[-238,+112]mm · 2 of 104 slices shown (9 of 10)]
[im 1/104]
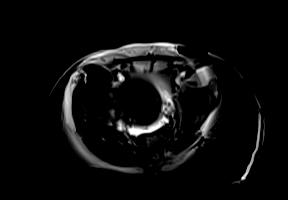
[im 104/104]
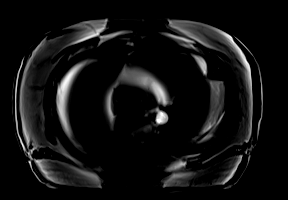

[Series 31: T1 dynamic · axial · 3.4mm · 1.56mm/px · z∈[-238,+112]mm · 2 of 104 slices shown (10 of 10)]
[im 1/104]
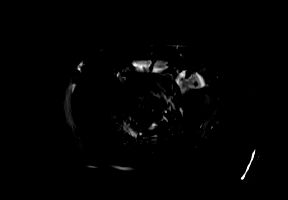
[im 104/104]
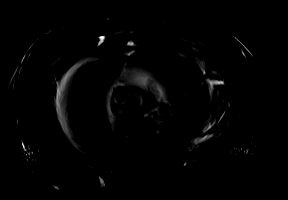

[Series 32: cor cine true-fisp · coronal · 10.0mm · 0.74mm/px · 2 of 100 slices shown (1 of 2)]
[im 1/100]
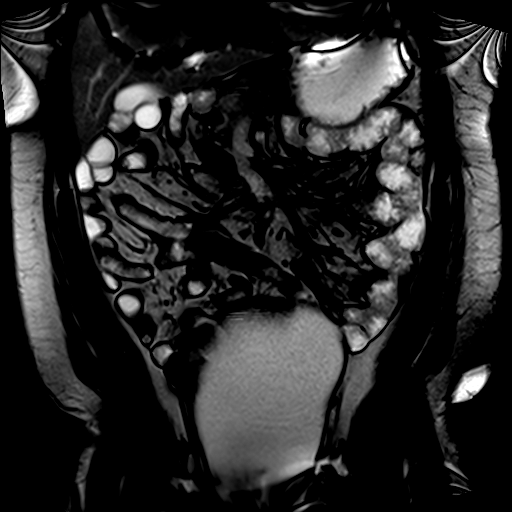
[im 100/100]
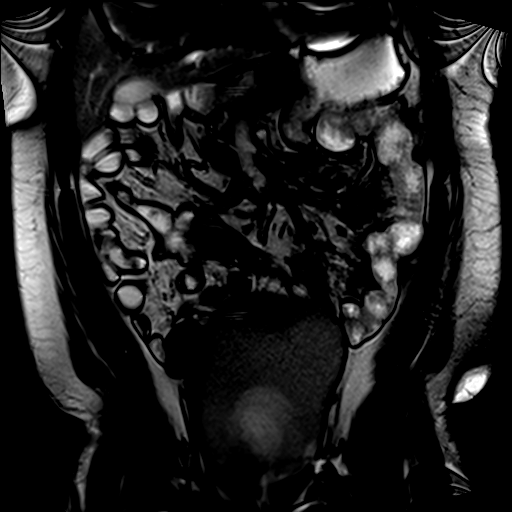

[Series 33: cor cine true-fisp · coronal · 10.0mm · 0.74mm/px · 2 of 100 slices shown (2 of 2)]
[im 1/100]
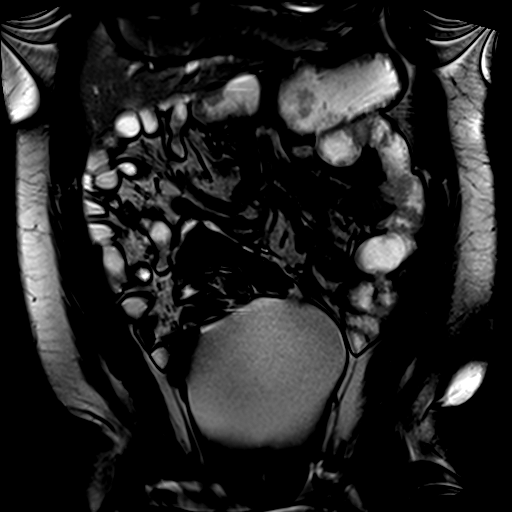
[im 100/100]
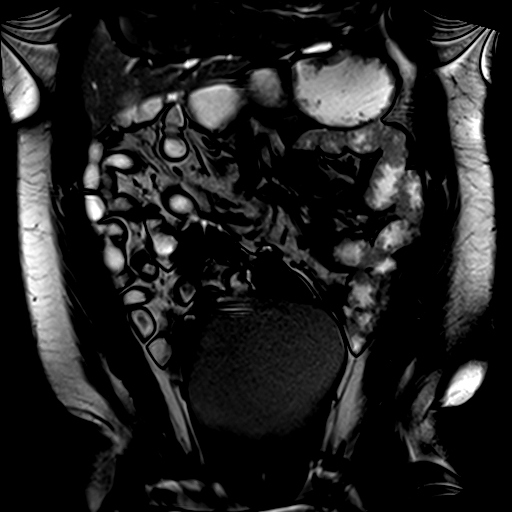

[40 of 48 positions shown; findings below may reference images not displayed]

FINDINGS: COMBINED FINDINGS FOR BOTH MR ABDOMEN AND PELVIS

Lower chest: Incidental imaging of the lung bases is unremarkable to
the extent evaluated.

Hepatobiliary: No visible hepatic lesion. The liver coverage limited
by field of view constraints. The portal vein is patent.

Pancreas: Normal intrinsic T1 signal. No ductal dilation or sign of
inflammation. No focal lesion.

Spleen:  Normal.

Adrenals/Urinary Tract:  Adrenal glands are normal.

Symmetric renal enhancement without hydronephrosis or suspicious
renal lesion.

Urinary bladder moderately distended extending into the low abdomen.

Stomach/Bowel: Post colectomy with ileoanal pouch anastomosis, not
well evaluated and shown to have inflammation on previous imaging.
Field of view constraints limited today's study. No gross
inflammation surrounding small bowel loops in the abdomen and no
sign of obstruction. Stool fills the pouch as visualized but there
is some stranding that is suspected to enter the presacral space in
the pelvis in this patient with signs of prior pouch inflammation.

Vascular/Lymphatic: No adenopathy in the abdomen or visualized
pelvis. Patent abdominal vessels without aneurysm of the abdominal
aorta.

Reproductive: No not assessed.

Other:  No ascites.

Musculoskeletal: No acute findings to the extent evaluated. There is
however some irregularity in the presacral space of the pelvis that
is not well assessed currently in this patient with pouch
inflammation on previous imaging from [4B] and extension of
inflammation into the perianal areas.
IMPRESSION: 1. Suspect continued inflammation of the patient's ileal pouch,
potentially extending into the presacral region. Area not well
assessed currently and the patient is being recalled for dedicated
imaging of the pelvis extending into the upper thigh to fully
assessed the perineum.
2. No gross inflammation surrounding small bowel loops in the
abdomen and no sign of obstruction.
3. No ascites.

ADDENDUM:
The patient is being recalled for dedicated pelvic fistula protocol
to further evaluate perianal disease.

*** End of Addendum ***
FINDINGS: COMBINED FINDINGS FOR BOTH MR ABDOMEN AND PELVIS

Lower chest: Incidental imaging of the lung bases is unremarkable to
the extent evaluated.

Hepatobiliary: No visible hepatic lesion. The liver coverage limited
by field of view constraints. The portal vein is patent.

Pancreas: Normal intrinsic T1 signal. No ductal dilation or sign of
inflammation. No focal lesion.

Spleen:  Normal.

Adrenals/Urinary Tract:  Adrenal glands are normal.

Symmetric renal enhancement without hydronephrosis or suspicious
renal lesion.

Urinary bladder moderately distended extending into the low abdomen.

Stomach/Bowel: Post colectomy with ileoanal pouch anastomosis, not
well evaluated and shown to have inflammation on previous imaging.
Field of view constraints limited today's study. No gross
inflammation surrounding small bowel loops in the abdomen and no
sign of obstruction. Stool fills the pouch as visualized but there
is some stranding that is suspected to enter the presacral space in
the pelvis in this patient with signs of prior pouch inflammation.

Vascular/Lymphatic: No adenopathy in the abdomen or visualized
pelvis. Patent abdominal vessels without aneurysm of the abdominal
aorta.

Reproductive: No not assessed.

Other:  No ascites.

Musculoskeletal: No acute findings to the extent evaluated. There is
however some irregularity in the presacral space of the pelvis that
is not well assessed currently in this patient with pouch
inflammation on previous imaging from [4B] and extension of
inflammation into the perianal areas.
IMPRESSION: 1. Suspect continued inflammation of the patient's ileal pouch,
potentially extending into the presacral region. Area not well
assessed currently and the patient is being recalled for dedicated
imaging of the pelvis extending into the upper thigh to fully
assessed the perineum.
2. No gross inflammation surrounding small bowel loops in the
abdomen and no sign of obstruction.
3. No ascites.

## 2021-12-12 MED ORDER — GADOBUTROL 1 MMOL/ML IV SOLN
10.0000 mL | Freq: Once | INTRAVENOUS | Status: AC | PRN
  Administered 2021-12-12: 10 mL via INTRAVENOUS

## 2021-12-12 MED ORDER — BARIUM SULFATE 0.1 % PO SUSP
450.0000 mL | Freq: Once | ORAL | Status: AC
  Administered 2021-12-12: 450 mL via ORAL

## 2021-12-14 ENCOUNTER — Other Ambulatory Visit: Payer: Self-pay | Admitting: Pharmacy Technician

## 2021-12-14 ENCOUNTER — Encounter: Payer: Self-pay | Admitting: Gastroenterology

## 2021-12-14 ENCOUNTER — Telehealth: Payer: Self-pay | Admitting: Gastroenterology

## 2021-12-14 NOTE — Telephone Encounter (Signed)
We received a call from [place] at [facility] requesting to speak with Dr. Loletha Carrow nurse. They need a standard order placed. Callback # [phone]

## 2021-12-14 NOTE — Telephone Encounter (Signed)
The pt MRI has been completed

## 2021-12-20 ENCOUNTER — Telehealth: Payer: Self-pay

## 2021-12-20 ENCOUNTER — Ambulatory Visit (AMBULATORY_SURGERY_CENTER): Payer: No Typology Code available for payment source | Admitting: Gastroenterology

## 2021-12-20 ENCOUNTER — Encounter: Payer: Self-pay | Admitting: Gastroenterology

## 2021-12-20 VITALS — BP 148/88 | HR 74 | Temp 98.9°F | Resp 23 | Ht 69.0 in | Wt 221.0 lb

## 2021-12-20 DIAGNOSIS — K50113 Crohn's disease of large intestine with fistula: Secondary | ICD-10-CM

## 2021-12-20 DIAGNOSIS — Z7960 Long term (current) use of unspecified immunomodulators and immunosuppressants: Secondary | ICD-10-CM

## 2021-12-20 DIAGNOSIS — K6289 Other specified diseases of anus and rectum: Secondary | ICD-10-CM

## 2021-12-20 MED ORDER — SODIUM CHLORIDE 0.9 % IV SOLN: 500.0000 mL | Freq: Once | INTRAVENOUS | Status: DC

## 2021-12-20 NOTE — Progress Notes (Signed)
Pt's states no medical or surgical changes since previsit or office visit. 

## 2021-12-20 NOTE — Telephone Encounter (Signed)
[facility] and spoke with Valarie Merino, Psychologist, clinical. He called [facility] MRI dept and they told him that patient would just need to call central scheduling and let them know that he is returning for additional images. No new order needs to be placed. Valarie Merino stated that I could also make the patient's appt.   Central scheduling is closed at this time. Will contact them in the morning to schedule patient's appt.

## 2021-12-20 NOTE — Progress Notes (Signed)
No changes to clinical history since GI office visit on 12/05/21.  The patient is appropriate for an endoscopic procedure in the ambulatory setting.  - Wilfrid Lund, MD

## 2021-12-20 NOTE — Op Note (Signed)
[place] Patient Name: Sean Schaefer Procedure Date: 12/20/2021 2:49 PM MRN: [id number] Endoscopist: [place]. Loletha Carrow , MD Age: 56 Referring MD:  Date of Birth: 05-30-1966 Gender: Male Account #: [id number] Procedure:                Pouchoscopy Indications:              History of total colectomy/J-pouch, Inflammatory                            bowel disease (fistulizing Crohn's disease,                            currently on infliximab and azathioprine                           recent MRE abdomen/pelvis showing improvement in                            pelvic inflammation. Radiology report indicates                            plans to contact patient to obtain deeper pelvic                            images Medicines:                Monitored Anesthesia Care Procedure:                Pre-Anesthesia Assessment:                           - Prior to the procedure, a History and Physical                            was performed, and patient medications and                            allergies were reviewed. The patient's tolerance of                            previous anesthesia was also reviewed. The risks                            and benefits of the procedure and the sedation                            options and risks were discussed with the patient.                            All questions were answered, and informed consent                            was obtained. Prior Anticoagulants: The patient has                            taken  no previous anticoagulant or antiplatelet                            agents. ASA Grade Assessment: II - A patient with                            mild systemic disease. After reviewing the risks                            and benefits, the patient was deemed in                            satisfactory condition to undergo the procedure.                           After obtaining informed consent, the endoscope was                             passed under direct vision. Throughout the                            procedure, the patient's blood pressure, pulse, and                            oxygen saturations were monitored continuously. The                            Olympus [id number]-[id number] (#[id number]) Colonoscope was                            introduced through the anus and advanced to the the                            ileoanal pouch and into the neo-terminal ileum.                            After obtaining informed consent, the endoscope was                            passed under direct vision. Throughout the                            procedure, the patient's blood pressure, pulse, and                            oxygen saturations were monitored continuously.The                            procedure was performed without difficulty. The                            patient tolerated the procedure well. The quality  of the bowel preparation was good. Scope In: 3:06:46 PM Scope Out: 3:10:52 PM Total Procedure Duration: 0 hours 4 minutes 6 seconds  Findings:                 Patient is status-post total colectomy with J-pouch                            reconstruction with an ileal pouch-anal anastomosis.                           A post-surgical anastomosis was found on digital                            exam. This was found to be intact. 2 steons and a                            penrose drain were present (see photos)                           The ileoanal pouch and neo-terminal ileum appeared                            normal. No active inflammation.                           Within the proximal to mid anal canal, two small                            excavated areas (? fistulous openings) were seen                            without drainage.                           In the most distal anal canal, two setons were                            exiting a fistulous opening. No drainage. (see                             photos) Complications:            No immediate complications. Estimated Blood Loss:     Estimated blood loss: none. Impression:               - Intact post-surgical anastomosis found on digital                            exam.                           - The ileoanal pouch and neo-terminal ileum are                            normal.                           -  No specimens collected. Recommendation:           - Patient has a contact number available for                            emergencies. The signs and symptoms of potential                            delayed complications were discussed with the                            patient. Return to normal activities tomorrow.                            Written discharge instructions were provided to the                            patient.                           - Resume previous diet.                           - Continue present medications.                           Contact radiology for further pelvic MRE images.                            Then re-evaluation by colorectal surgery. Boyce Keltner L. Loletha Carrow, MD 12/20/2021 3:22:27 PM This report has been signed electronically.

## 2021-12-20 NOTE — Progress Notes (Signed)
A and O x3. Report to RN. Tolerated MAC anesthesia well. 

## 2021-12-20 NOTE — Telephone Encounter (Signed)
This patient recently had an MRI of the abdomen and pelvis for his Crohn's disease.  The report states that [facility] radiology was planning to contact him to bring him back in for further images since he felt he had not gotten deep enough into the pelvis and perineum. However, when I saw him today for his pouchoscopy, he and his wife told me they have not heard from [facility] radiology. I asked them to contact radiology, but it would be very helpful if you would please call [facility] radiology as well to help expedite that.  It seems to have fallen through the cracks somehow.

## 2021-12-20 NOTE — Patient Instructions (Signed)
Contact Radiology for further pelvic MRE images ,then reevaluation by colorectal surgery   YOU HAD AN ENDOSCOPIC PROCEDURE TODAY AT [place]:   Refer to the procedure report that was given to you for any specific questions about what was found during the examination.  If the procedure report does not answer your questions, please call your gastroenterologist to clarify.  If you requested that your care partner not be given the details of your procedure findings, then the procedure report has been included in a sealed envelope for you to review at your convenience later.  YOU SHOULD EXPECT: Some feelings of bloating in the abdomen. Passage of more gas than usual.  Walking can help get rid of the air that was put into your GI tract during the procedure and reduce the bloating. If you had a lower endoscopy (such as a colonoscopy or flexible sigmoidoscopy) you may notice spotting of blood in your stool or on the toilet paper. If you underwent a bowel prep for your procedure, you may not have a normal bowel movement for a few days.  Please Note:  You might notice some irritation and congestion in your nose or some drainage.  This is from the oxygen used during your procedure.  There is no need for concern and it should clear up in a day or so.  SYMPTOMS TO REPORT IMMEDIATELY:  Following lower endoscopy (colonoscopy or flexible sigmoidoscopy):  Excessive amounts of blood in the stool  Significant tenderness or worsening of abdominal pains  Swelling of the abdomen that is new, acute  Fever of 100F or higher   For urgent or emergent issues, a gastroenterologist can be reached at any hour by calling [phone]. Do not use MyChart messaging for urgent concerns.    DIET:  We do recommend a small meal at first, but then you may proceed to your regular diet.  Drink plenty of fluids but you should avoid alcoholic beverages for 24 hours.  ACTIVITY:  You should plan to take it  easy for the rest of today and you should NOT DRIVE or use heavy machinery until tomorrow (because of the sedation medicines used during the test).    FOLLOW UP: Our staff will call the number listed on your records 48-72 hours following your procedure to check on you and address any questions or concerns that you may have regarding the information given to you following your procedure. If we do not reach you, we will leave a message.  We will attempt to reach you two times.  During this call, we will ask if you have developed any symptoms of COVID 19. If you develop any symptoms (ie: fever, flu-like symptoms, shortness of breath, cough etc.) before then, please call [phone].  If you test positive for Covid 19 in the 2 weeks post procedure, please call and report this information to [place].    If any biopsies were taken you will be contacted by phone or by letter within the next 1-3 weeks.  Please call us at [phone] if you have not heard about the biopsies in 3 weeks.    SIGNATURES/CONFIDENTIALITY: You and/or your care partner have signed paperwork which will be entered into your electronic medical record.  These signatures attest to the fact that that the information above on your After Visit Summary has been reviewed and is understood.  Full responsibility of the confidentiality of this discharge information lies with you and/or your care-partner.

## 2021-12-21 ENCOUNTER — Telehealth: Payer: Self-pay | Admitting: Pharmacy Technician

## 2021-12-21 ENCOUNTER — Telehealth: Payer: Self-pay | Admitting: *Deleted

## 2021-12-21 NOTE — Telephone Encounter (Addendum)
Auth Submission: INCREASE DOSING- 5MG/KG Q4WKS APPROVED Payer: [employer] Medication & CPT/J Code(s) submitted: Remicade (Infliximab) J1745 Route of submission (phone, fax, portal): PORTAL- MEDWATCH Auth type: Buy/Bill Units/visits requested: 5MG/KG Q4WKS Reference number: [id number] PA: [phone] Approval from: 12/28/21 to 12/29/22.

## 2021-12-21 NOTE — Telephone Encounter (Signed)
  Follow up Call-     12/20/2021    2:00 PM  Call back number  Post procedure Call Back phone  # [phone]  Permission to leave phone message Yes     Patient questions:  Do you have a fever, pain , or abdominal swelling? No. Pain Score  0 *  Have you tolerated food without any problems? Yes.    Have you been able to return to your normal activities? Yes.    Do you have any questions about your discharge instructions: Diet   No. Medications  No. Follow up visit  No.  Do you have questions or concerns about your Care? No.  Actions: * If pain score is 4 or above: No action needed, pain <4.

## 2021-12-21 NOTE — Telephone Encounter (Signed)
Called central scheduling. They reached out to [facility] and they told me that I do have to place a new order. They are requesting that I place an order for MR pelvis W/WO pelvic fistula protocol to further evaluate perianal disease. I informed them that when I called yesterday I was told that no new order needed to be placed. I also asked do they not contact the patient to set up the return appt is they are recalled? I was informed that a message would be sent to April Pait to find out.  MR order placed. Secure staff message sent to April Pait to have patient contacted to set up his appointment.

## 2021-12-24 NOTE — Telephone Encounter (Signed)
MR Pelvis appt has been scheduled for Monday, 12/31/21 at 7 am.

## 2021-12-28 ENCOUNTER — Other Ambulatory Visit (HOSPITAL_COMMUNITY): Payer: No Typology Code available for payment source

## 2021-12-31 ENCOUNTER — Ambulatory Visit (HOSPITAL_COMMUNITY)
Admission: RE | Admit: 2021-12-31 | Discharge: 2021-12-31 | Disposition: A | Discharge status: Home / Self Care | Payer: No Typology Code available for payment source | Source: Ambulatory Visit | Attending: Gastroenterology | Admitting: Gastroenterology

## 2021-12-31 ENCOUNTER — Other Ambulatory Visit (HOSPITAL_COMMUNITY): Payer: Self-pay

## 2021-12-31 DIAGNOSIS — Z7960 Long term (current) use of unspecified immunomodulators and immunosuppressants: Secondary | ICD-10-CM | POA: Diagnosis present

## 2021-12-31 DIAGNOSIS — K6289 Other specified diseases of anus and rectum: Secondary | ICD-10-CM | POA: Insufficient documentation

## 2021-12-31 DIAGNOSIS — K50113 Crohn's disease of large intestine with fistula: Secondary | ICD-10-CM | POA: Insufficient documentation

## 2021-12-31 IMAGING — MR MR PELVIS WO/W CM
5 of 8 series · 28 of 48 positions shown · IV contrast (gadavist)
Comparison: Pelvis CT on [DATE]

CLINICAL DATA: Crohn disease. Perianal fistula. Previous colectomy
with ileal pouch.

EXAM:
MRI PELVIS WITHOUT AND WITH CONTRAST
TECHNIQUE: Multiplanar multisequence MR imaging of the pelvis was performed
both before and after administration of intravenous contrast.
CONTRAST:  10mL GADAVIST GADOBUTROL 1 MMOL/ML IV SOLN

[Series 2: T2 · sagittal · 2.5mm · 1.17mm/px · 6 of 43 slices shown (1 of 2)]
[im 1/43]
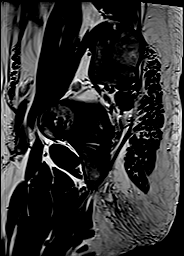
[im 9/43]
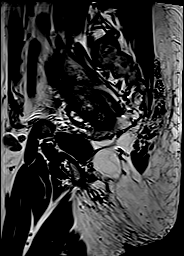
[im 17/43]
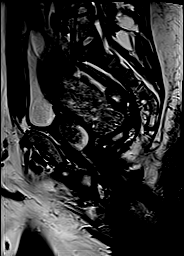
[im 26/43]
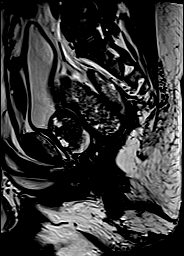
[im 34/43]
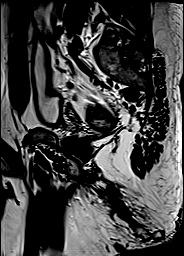
[im 43/43]
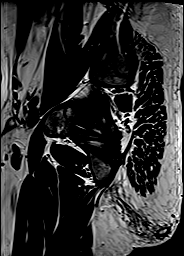

[Series 3: T2 fat-sat · sagittal · 2.5mm · 1.17mm/px · 6 of 43 slices shown (1 of 2)]
[im 1/43]
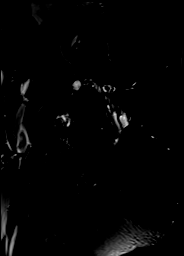
[im 9/43]
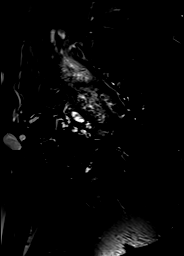
[im 17/43]
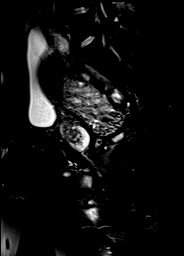
[im 26/43]
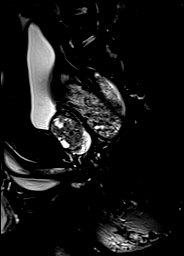
[im 34/43]
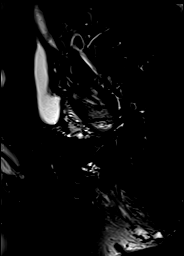
[im 43/43]
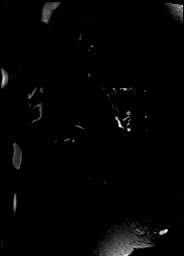

[Series 4: T1 · axial · 4.0mm · 0.43mm/px · z∈[-224,-82]mm · 6 of 34 slices shown]
[im 1/34]
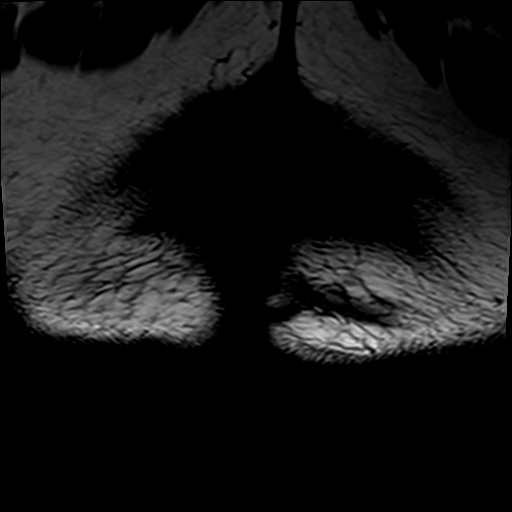
[im 7/34]
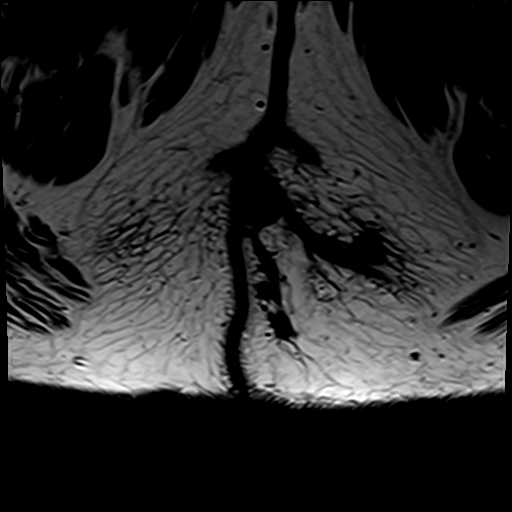
[im 14/34]
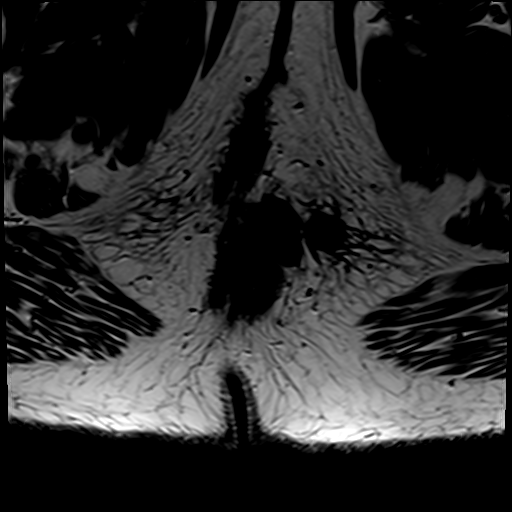
[im 20/34]
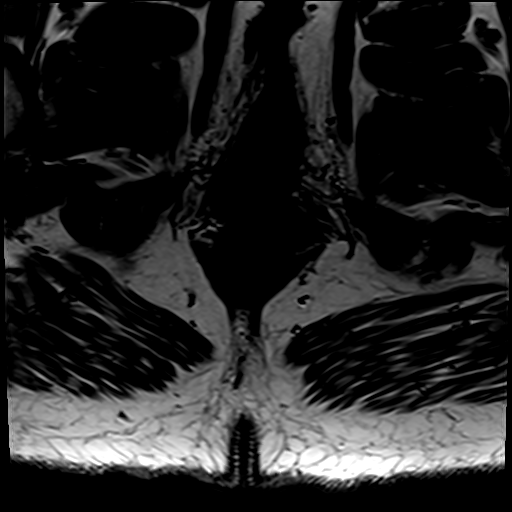
[im 27/34]
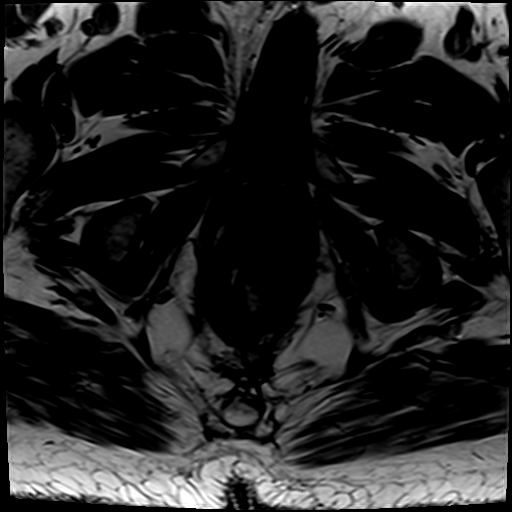
[im 34/34]
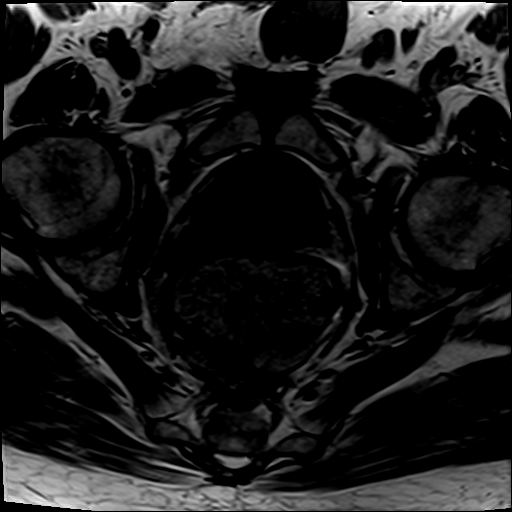

[Series 5: T2 · axial · 4.0mm · 0.43mm/px · z∈[-224,-82]mm · 6 of 34 slices shown (2 of 2)]
[im 1/34]
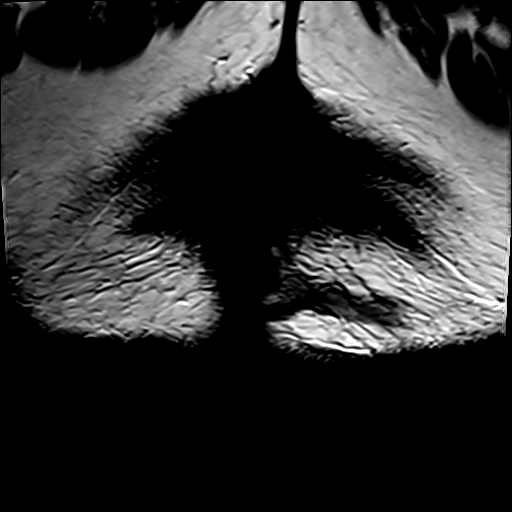
[im 7/34]
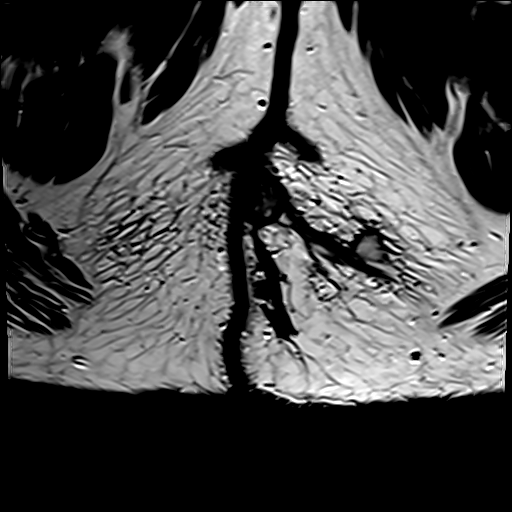
[im 14/34]
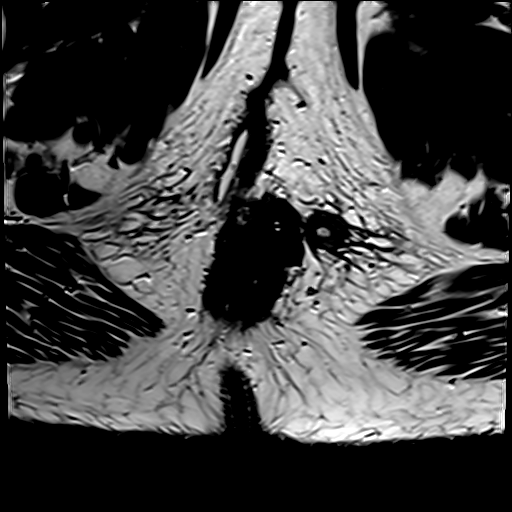
[im 20/34]
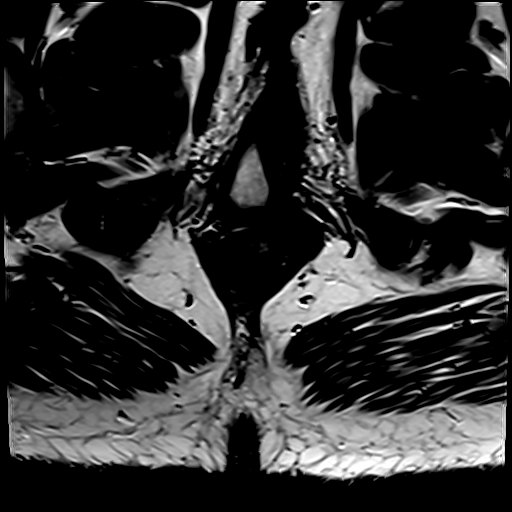
[im 27/34]
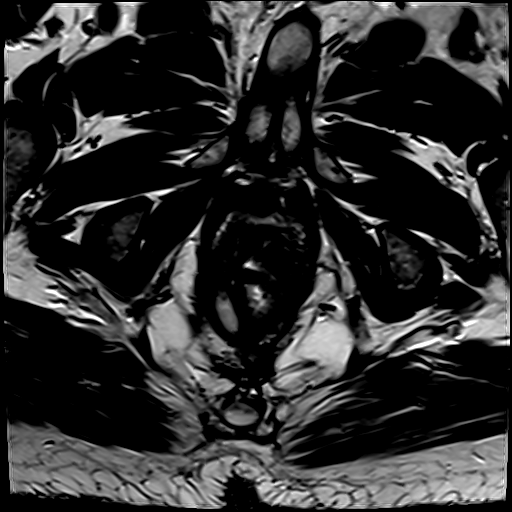
[im 34/34]
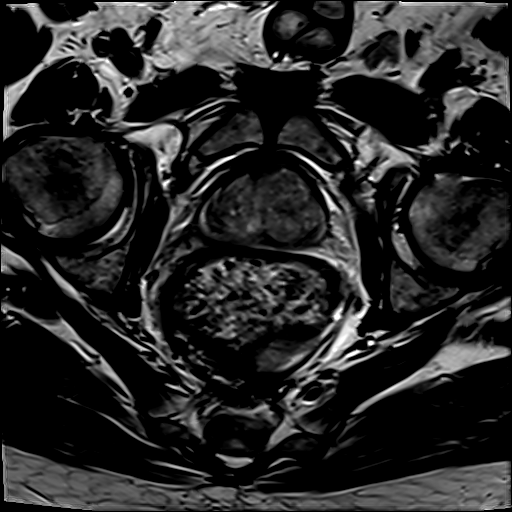

[Series 6: T2 fat-sat · axial · 4.0mm · 0.43mm/px · z∈[-232,-142]mm · 4 of 36 slices shown (2 of 2)]
[im 1/36]
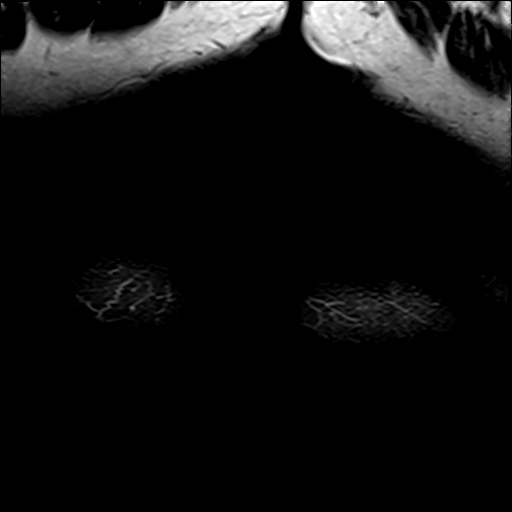
[im 8/36]
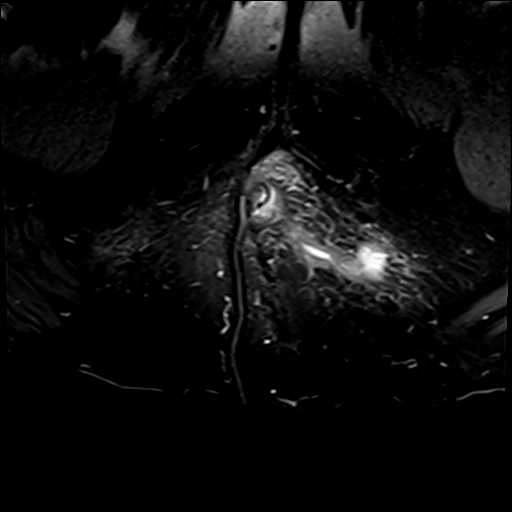
[im 15/36]
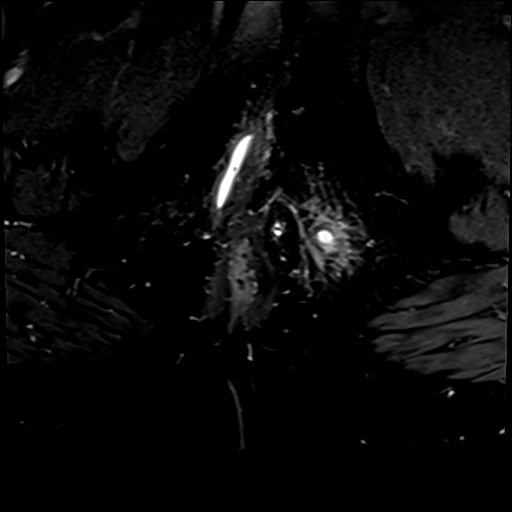
[im 22/36]
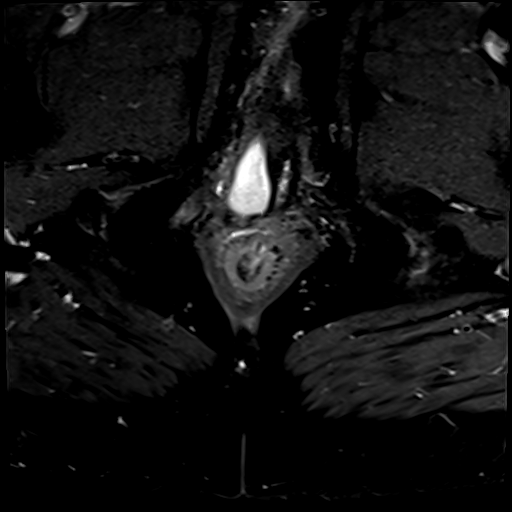

[28 of 48 positions shown; findings below may reference images not displayed]

FINDINGS: Lower Urinary Tract: Unremarkable urinary bladder.

Bowel: Ileoanal anastomosis is again seen. Two simple right-sided
perianal fistulae are seen arising from the anterior anal wall at
approximately the 12 o'clock position. One of these fistulae is
transsphincteric, and extends anteriorly and inferiorly into the
perineum (see image [DATE]). The other right-sided fistula is
supralevator in location, and extends along the right lateral anal
wall (e.g. image [DATE]).

Several fistulae are seen arising from the left anterior lateral
anal wall from the 1 to 4 o'clock positions. These fistulae show
transsphincteric extension. One extends anteriorly in the base of
the penis, along the left lateral wall of the corpus spongiosum.
Several others show extension inferiorly and laterally into the left
buttock subcutaneous tissues, exiting into the gluteal crease or
skin surface of the left buttock.

Two abscesses are located in the left buttock subcutaneous tissues
which are associated with these fistulae. These measure 4.1 x 1.5 cm
and 1.6 x 1.0 cm on image 33/6. These show significant decrease in
size compared to the large perianal abscess seen on prior CT which
measures 7.6 x 5.6 cm.

Vascular/Lymphatic: No pathologically enlarged lymph nodes or other
significant abnormality.

Reproductive:  No mass or other significant abnormality.

Other: None.

Musculoskeletal: No suspicious bone lesions identified.
IMPRESSION: Multiple bilateral transsphincteric perianal fistulae, left side
greater than right, as described above.

Two small abscesses in the left buttock subcutaneous tissues,
measuring 4.1 cm and 1.6 cm, significantly decreased in size
compared to prior CT.

## 2021-12-31 MED ORDER — GADOBUTROL 1 MMOL/ML IV SOLN
10.0000 mL | Freq: Once | INTRAVENOUS | Status: AC | PRN
  Administered 2021-12-31: 10 mL via INTRAVENOUS

## 2022-01-01 ENCOUNTER — Encounter: Payer: Self-pay | Admitting: Gastroenterology

## 2022-01-01 ENCOUNTER — Other Ambulatory Visit (HOSPITAL_COMMUNITY): Payer: Self-pay

## 2022-01-03 ENCOUNTER — Ambulatory Visit (INDEPENDENT_AMBULATORY_CARE_PROVIDER_SITE_OTHER): Payer: No Typology Code available for payment source

## 2022-01-03 VITALS — BP 168/95 | HR 80 | Temp 98.2°F | Resp 18 | Ht 69.0 in | Wt 219.2 lb

## 2022-01-03 DIAGNOSIS — K50114 Crohn's disease of large intestine with abscess: Secondary | ICD-10-CM | POA: Diagnosis not present

## 2022-01-03 MED ORDER — SODIUM CHLORIDE 0.9 % IV SOLN
5.0000 mg/kg | INTRAVENOUS | Status: DC
  Administered 2022-01-03: 500 mg via INTRAVENOUS
  Filled 2022-01-03: qty 50

## 2022-01-03 MED ORDER — METHYLPREDNISOLONE SODIUM SUCC 40 MG IJ SOLR
40.0000 mg | Freq: Once | INTRAMUSCULAR | Status: AC
  Administered 2022-01-03: 40 mg via INTRAVENOUS
  Filled 2022-01-03: qty 1

## 2022-01-03 MED ORDER — DIPHENHYDRAMINE HCL 25 MG PO CAPS
25.0000 mg | ORAL_CAPSULE | Freq: Once | ORAL | Status: AC
  Administered 2022-01-03: 25 mg via ORAL
  Filled 2022-01-03: qty 1

## 2022-01-03 MED ORDER — ACETAMINOPHEN 325 MG PO TABS
650.0000 mg | ORAL_TABLET | Freq: Once | ORAL | Status: AC
  Administered 2022-01-03: 650 mg via ORAL
  Filled 2022-01-03: qty 2

## 2022-01-03 NOTE — Progress Notes (Signed)
Diagnosis: Crohn's Disease  Provider:  Marshell Garfinkel, MD  Procedure: Infusion  IV Type: Peripheral, IV Location: L Forearm  Remicade (Infliximab), Dose: 500 mg  Infusion Start Time: 4599  Infusion Stop Time: 1211  Post Infusion IV Care: Peripheral IV Discontinued  Discharge: Condition: Good, Destination: Home . AVS provided to patient.   Performed by:  Adelina Mings, LPN

## 2022-01-11 ENCOUNTER — Other Ambulatory Visit (HOSPITAL_COMMUNITY): Payer: Self-pay

## 2022-01-11 MED ORDER — ROSUVASTATIN CALCIUM 10 MG PO TABS
10.0000 mg | ORAL_TABLET | Freq: Every day | ORAL | 3 refills | Status: DC
Start: 2022-01-11 — End: 2022-10-22
  Filled 2022-01-11: qty 90, 90d supply, fill #0
  Filled 2022-09-25: qty 90, 90d supply, fill #1

## 2022-01-11 MED ORDER — LOSARTAN POTASSIUM 25 MG PO TABS
25.0000 mg | ORAL_TABLET | Freq: Every day | ORAL | 3 refills | Status: DC
  Filled 2022-01-11: qty 90, 90d supply, fill #0
  Filled 2022-09-25: qty 90, 90d supply, fill #1

## 2022-01-14 ENCOUNTER — Other Ambulatory Visit (HOSPITAL_COMMUNITY): Payer: Self-pay

## 2022-01-14 MED ORDER — INSULIN PEN NEEDLE 32G X 6 MM MISC
3 refills | Status: DC
  Filled 2022-01-14: qty 400, 90d supply, fill #0
  Filled 2022-01-16 (×2): qty 300, 75d supply, fill #0

## 2022-01-14 MED ORDER — INSULIN LISPRO (1 UNIT DIAL) 100 UNIT/ML (KWIKPEN)
PEN_INJECTOR | SUBCUTANEOUS | 3 refills | Status: DC
  Filled 2022-01-14: qty 24, 90d supply, fill #0

## 2022-01-14 MED ORDER — TRESIBA FLEXTOUCH 100 UNIT/ML ~~LOC~~ SOPN
PEN_INJECTOR | SUBCUTANEOUS | 3 refills | Status: DC
Start: 2022-01-14 — End: 2022-12-24
  Filled 2022-01-14: qty 33, 87d supply, fill #0

## 2022-01-14 MED ORDER — FREESTYLE LIBRE 14 DAY READER DEVI
11 refills | Status: DC
Start: 2022-01-14 — End: 2022-10-19
  Filled 2022-01-14: qty 1, 30d supply, fill #0

## 2022-01-15 ENCOUNTER — Other Ambulatory Visit (HOSPITAL_COMMUNITY): Payer: Self-pay

## 2022-01-16 ENCOUNTER — Other Ambulatory Visit (HOSPITAL_COMMUNITY): Payer: Self-pay

## 2022-01-17 ENCOUNTER — Other Ambulatory Visit (HOSPITAL_COMMUNITY): Payer: Self-pay

## 2022-01-26 ENCOUNTER — Other Ambulatory Visit (HOSPITAL_COMMUNITY): Payer: Self-pay

## 2022-01-29 ENCOUNTER — Encounter: Payer: Self-pay | Admitting: Gastroenterology

## 2022-01-31 ENCOUNTER — Ambulatory Visit (INDEPENDENT_AMBULATORY_CARE_PROVIDER_SITE_OTHER): Payer: No Typology Code available for payment source

## 2022-01-31 VITALS — BP 180/106 | HR 77 | Temp 98.1°F | Resp 18 | Ht 70.0 in | Wt 221.6 lb

## 2022-01-31 DIAGNOSIS — K50114 Crohn's disease of large intestine with abscess: Secondary | ICD-10-CM | POA: Diagnosis not present

## 2022-01-31 MED ORDER — ACETAMINOPHEN 325 MG PO TABS
650.0000 mg | ORAL_TABLET | Freq: Once | ORAL | Status: AC
  Administered 2022-01-31: 650 mg via ORAL
  Filled 2022-01-31: qty 2

## 2022-01-31 MED ORDER — DIPHENHYDRAMINE HCL 25 MG PO CAPS
25.0000 mg | ORAL_CAPSULE | Freq: Once | ORAL | Status: AC
  Administered 2022-01-31: 25 mg via ORAL
  Filled 2022-01-31: qty 1

## 2022-01-31 MED ORDER — METHYLPREDNISOLONE SODIUM SUCC 40 MG IJ SOLR
40.0000 mg | Freq: Once | INTRAMUSCULAR | Status: AC
  Administered 2022-01-31: 40 mg via INTRAVENOUS
  Filled 2022-01-31: qty 1

## 2022-01-31 MED ORDER — SODIUM CHLORIDE 0.9 % IV SOLN
5.0000 mg/kg | INTRAVENOUS | Status: DC
  Administered 2022-01-31: 500 mg via INTRAVENOUS
  Filled 2022-01-31: qty 50

## 2022-01-31 NOTE — Progress Notes (Signed)
Diagnosis: Crohn's Disease  Provider:  Marshell Garfinkel, MD  Procedure: Infusion  IV Type: Peripheral, IV Location: R Antecubital  Remicade (Infliximab), Dose: 500 mg  Infusion Start Time: 0930  Infusion Stop Time: 2025  Post Infusion IV Care: Peripheral IV Discontinued  Discharge: Condition: Good, Destination: Home . AVS provided to patient.   Performed by:  Adelina Mings, LPN

## 2022-02-01 ENCOUNTER — Other Ambulatory Visit: Payer: Self-pay | Admitting: Gastroenterology

## 2022-02-01 ENCOUNTER — Other Ambulatory Visit (HOSPITAL_COMMUNITY): Payer: Self-pay

## 2022-02-01 MED ORDER — AZATHIOPRINE 100 MG PO TABS
150.0000 mg | ORAL_TABLET | Freq: Every day | ORAL | 2 refills | Status: DC
  Filled 2022-02-01: qty 45, 30d supply, fill #0
  Filled 2022-03-30: qty 45, 30d supply, fill #1
  Filled 2022-05-03: qty 45, 30d supply, fill #2

## 2022-02-02 ENCOUNTER — Other Ambulatory Visit (HOSPITAL_COMMUNITY): Payer: Self-pay

## 2022-02-08 ENCOUNTER — Other Ambulatory Visit (HOSPITAL_COMMUNITY): Payer: Self-pay

## 2022-02-12 ENCOUNTER — Other Ambulatory Visit (HOSPITAL_COMMUNITY): Payer: Self-pay

## 2022-02-14 ENCOUNTER — Other Ambulatory Visit (HOSPITAL_COMMUNITY): Payer: Self-pay

## 2022-02-15 ENCOUNTER — Other Ambulatory Visit (HOSPITAL_COMMUNITY): Payer: Self-pay

## 2022-02-15 MED ORDER — FREESTYLE LIBRE 14 DAY SENSOR MISC
11 refills | Status: DC
  Filled 2022-02-15: qty 2, 28d supply, fill #0

## 2022-02-15 MED ORDER — FREESTYLE LIBRE 2 READER DEVI
11 refills | Status: DC
Start: 2022-02-14 — End: 2022-10-19
  Filled 2022-02-15: qty 1, 30d supply, fill #0

## 2022-02-20 ENCOUNTER — Other Ambulatory Visit (HOSPITAL_COMMUNITY): Payer: Self-pay

## 2022-03-01 ENCOUNTER — Ambulatory Visit (INDEPENDENT_AMBULATORY_CARE_PROVIDER_SITE_OTHER): Payer: No Typology Code available for payment source

## 2022-03-01 VITALS — BP 195/101 | HR 79 | Temp 98.0°F | Resp 18 | Ht 69.0 in | Wt 222.0 lb

## 2022-03-01 DIAGNOSIS — K50114 Crohn's disease of large intestine with abscess: Secondary | ICD-10-CM | POA: Diagnosis not present

## 2022-03-01 MED ORDER — DIPHENHYDRAMINE HCL 25 MG PO CAPS
25.0000 mg | ORAL_CAPSULE | Freq: Once | ORAL | Status: AC
  Administered 2022-03-01: 25 mg via ORAL
  Filled 2022-03-01: qty 1

## 2022-03-01 MED ORDER — SODIUM CHLORIDE 0.9 % IV SOLN
5.0000 mg/kg | INTRAVENOUS | Status: DC
  Administered 2022-03-01: 500 mg via INTRAVENOUS
  Filled 2022-03-01: qty 50

## 2022-03-01 MED ORDER — ACETAMINOPHEN 325 MG PO TABS
650.0000 mg | ORAL_TABLET | Freq: Once | ORAL | Status: AC
  Administered 2022-03-01: 650 mg via ORAL
  Filled 2022-03-01: qty 2

## 2022-03-01 MED ORDER — METHYLPREDNISOLONE SODIUM SUCC 40 MG IJ SOLR
40.0000 mg | Freq: Once | INTRAMUSCULAR | Status: AC
  Administered 2022-03-01: 40 mg via INTRAVENOUS
  Filled 2022-03-01: qty 1

## 2022-03-01 NOTE — Progress Notes (Signed)
Diagnosis: Crohn's Disease  Provider:  Marshell Garfinkel MD  Procedure: Infusion  IV Type: Peripheral, IV Location: R Antecubital  Remicade (Infliximab), Dose: 500 mg  Infusion Start Time: 6838  Infusion Stop Time: 1216  Post Infusion IV Care: Peripheral IV Discontinued  Discharge: Condition: Good, Destination: Home . AVS provided to patient.   Performed by:  Adelina Mings, LPN

## 2022-03-05 ENCOUNTER — Other Ambulatory Visit (HOSPITAL_COMMUNITY): Payer: Self-pay

## 2022-03-13 ENCOUNTER — Other Ambulatory Visit (HOSPITAL_COMMUNITY): Payer: Self-pay

## 2022-03-28 ENCOUNTER — Ambulatory Visit (INDEPENDENT_AMBULATORY_CARE_PROVIDER_SITE_OTHER): Payer: No Typology Code available for payment source

## 2022-03-28 VITALS — BP 176/92 | HR 80 | Temp 98.6°F | Resp 18 | Ht 69.0 in | Wt 213.4 lb

## 2022-03-28 DIAGNOSIS — K50114 Crohn's disease of large intestine with abscess: Secondary | ICD-10-CM

## 2022-03-28 MED ORDER — DIPHENHYDRAMINE HCL 25 MG PO CAPS
25.0000 mg | ORAL_CAPSULE | Freq: Once | ORAL | Status: AC
  Administered 2022-03-28: 25 mg via ORAL
  Filled 2022-03-28: qty 1

## 2022-03-28 MED ORDER — ACETAMINOPHEN 325 MG PO TABS
650.0000 mg | ORAL_TABLET | Freq: Once | ORAL | Status: AC
  Administered 2022-03-28: 650 mg via ORAL
  Filled 2022-03-28: qty 2

## 2022-03-28 MED ORDER — METHYLPREDNISOLONE SODIUM SUCC 40 MG IJ SOLR
40.0000 mg | Freq: Once | INTRAMUSCULAR | Status: AC
  Administered 2022-03-28: 40 mg via INTRAVENOUS
  Filled 2022-03-28: qty 1

## 2022-03-28 MED ORDER — SODIUM CHLORIDE 0.9 % IV SOLN
5.0000 mg/kg | INTRAVENOUS | Status: DC
  Administered 2022-03-28: 500 mg via INTRAVENOUS
  Filled 2022-03-28: qty 50

## 2022-03-28 NOTE — Progress Notes (Signed)
Diagnosis: Crohn's Disease  Provider:  Marshell Garfinkel MD  Procedure: Infusion  IV Type: Peripheral, IV Location: R Forearm  Remicade (Infliximab), Dose: 500 mg  Infusion Start Time: 9532  Infusion Stop Time: 1215  Post Infusion IV Care: Observation period completed and Peripheral IV Discontinued  Discharge: Condition: Good, Destination: Home . AVS provided to patient.   Performed by:  Adelina Mings, LPN

## 2022-03-30 ENCOUNTER — Other Ambulatory Visit (HOSPITAL_COMMUNITY): Payer: Self-pay

## 2022-04-25 ENCOUNTER — Ambulatory Visit (INDEPENDENT_AMBULATORY_CARE_PROVIDER_SITE_OTHER): Payer: No Typology Code available for payment source

## 2022-04-25 VITALS — BP 186/108 | HR 73 | Temp 98.0°F | Resp 16 | Ht 69.0 in | Wt 216.0 lb

## 2022-04-25 DIAGNOSIS — K50114 Crohn's disease of large intestine with abscess: Secondary | ICD-10-CM

## 2022-04-25 MED ORDER — ACETAMINOPHEN 325 MG PO TABS
650.0000 mg | ORAL_TABLET | Freq: Once | ORAL | Status: AC
  Administered 2022-04-25: 650 mg via ORAL
  Filled 2022-04-25: qty 2

## 2022-04-25 MED ORDER — METHYLPREDNISOLONE SODIUM SUCC 40 MG IJ SOLR
40.0000 mg | Freq: Once | INTRAMUSCULAR | Status: AC
  Administered 2022-04-25: 40 mg via INTRAVENOUS
  Filled 2022-04-25: qty 1

## 2022-04-25 MED ORDER — SODIUM CHLORIDE 0.9 % IV SOLN
5.0000 mg/kg | INTRAVENOUS | Status: DC
  Administered 2022-04-25: 500 mg via INTRAVENOUS
  Filled 2022-04-25: qty 50

## 2022-04-25 MED ORDER — DIPHENHYDRAMINE HCL 25 MG PO CAPS
25.0000 mg | ORAL_CAPSULE | Freq: Once | ORAL | Status: AC
  Administered 2022-04-25: 25 mg via ORAL
  Filled 2022-04-25: qty 1

## 2022-04-25 NOTE — Progress Notes (Signed)
Diagnosis: Crohn's Disease  Provider:  Marshell Garfinkel MD  Procedure: Infusion  IV Type: Peripheral, IV Location: L Hand  Remicade (Infliximab), Dose: 500 mg  Infusion Start Time: 8358  Infusion Stop Time: 1200  Post Infusion IV Care: Peripheral IV Discontinued  Discharge: Condition: Good, Destination: Home . AVS provided to patient.   Performed by:  Koren Shiver, RN

## 2022-05-04 ENCOUNTER — Other Ambulatory Visit (HOSPITAL_COMMUNITY): Payer: Self-pay

## 2022-05-23 ENCOUNTER — Ambulatory Visit (INDEPENDENT_AMBULATORY_CARE_PROVIDER_SITE_OTHER): Payer: No Typology Code available for payment source

## 2022-05-23 VITALS — BP 176/115 | HR 86 | Temp 98.3°F | Resp 18 | Ht 70.0 in | Wt 218.0 lb

## 2022-05-23 DIAGNOSIS — K50114 Crohn's disease of large intestine with abscess: Secondary | ICD-10-CM | POA: Diagnosis not present

## 2022-05-23 MED ORDER — METHYLPREDNISOLONE SODIUM SUCC 40 MG IJ SOLR
40.0000 mg | Freq: Once | INTRAMUSCULAR | Status: AC
  Administered 2022-05-23: 40 mg via INTRAVENOUS
  Filled 2022-05-23: qty 1

## 2022-05-23 MED ORDER — DIPHENHYDRAMINE HCL 25 MG PO CAPS
25.0000 mg | ORAL_CAPSULE | Freq: Once | ORAL | Status: AC
  Administered 2022-05-23: 25 mg via ORAL
  Filled 2022-05-23: qty 1

## 2022-05-23 MED ORDER — ACETAMINOPHEN 325 MG PO TABS
650.0000 mg | ORAL_TABLET | Freq: Once | ORAL | Status: AC
  Administered 2022-05-23: 650 mg via ORAL
  Filled 2022-05-23: qty 2

## 2022-05-23 MED ORDER — SODIUM CHLORIDE 0.9 % IV SOLN
5.0000 mg/kg | INTRAVENOUS | Status: DC
  Administered 2022-05-23: 500 mg via INTRAVENOUS
  Filled 2022-05-23: qty 50

## 2022-05-23 NOTE — Progress Notes (Signed)
Diagnosis: Crohn's Disease  Provider:  Marshell Garfinkel MD  Procedure: Infusion  IV Type: Peripheral, IV Location: R Antecubital  Remicade (Infliximab), Dose: 500 mg  Infusion Start Time: 4301  Infusion Stop Time: 1230  Post Infusion IV Care: Peripheral IV Discontinued  Discharge: Condition: Good, Destination: Home . AVS provided to patient.   Performed by:  Cleophus Molt, RN

## 2022-06-05 ENCOUNTER — Other Ambulatory Visit: Payer: Self-pay | Admitting: Gastroenterology

## 2022-06-06 ENCOUNTER — Telehealth: Payer: Self-pay | Admitting: Gastroenterology

## 2022-06-06 ENCOUNTER — Other Ambulatory Visit: Payer: Self-pay

## 2022-06-06 ENCOUNTER — Other Ambulatory Visit (HOSPITAL_COMMUNITY): Payer: Self-pay

## 2022-06-06 MED ORDER — AZATHIOPRINE 100 MG PO TABS
150.0000 mg | ORAL_TABLET | Freq: Every day | ORAL | 2 refills | Status: DC
  Filled 2022-06-06: qty 45, 30d supply, fill #0

## 2022-06-06 MED ORDER — AZATHIOPRINE 50 MG PO TABS
150.0000 mg | ORAL_TABLET | Freq: Every day | ORAL | 1 refills | Status: DC
  Filled 2022-06-06: qty 180, 120d supply, fill #0
  Filled 2022-06-08: qty 39, 26d supply, fill #0
  Filled 2022-06-08: qty 6, 4d supply, fill #0
  Filled 2022-06-10: qty 270, 90d supply, fill #0
  Filled 2022-09-08: qty 270, 90d supply, fill #1

## 2022-06-06 NOTE — Telephone Encounter (Signed)
Spoke to Mrs. Coles who requested a 90 days supply. New Rx was submitted.

## 2022-06-06 NOTE — Telephone Encounter (Signed)
Inbound call from patient wife requesting a call in regards to imuran. Please advise.

## 2022-06-08 ENCOUNTER — Other Ambulatory Visit (HOSPITAL_COMMUNITY): Payer: Self-pay

## 2022-06-10 ENCOUNTER — Other Ambulatory Visit (HOSPITAL_COMMUNITY): Payer: Self-pay

## 2022-06-20 ENCOUNTER — Ambulatory Visit (INDEPENDENT_AMBULATORY_CARE_PROVIDER_SITE_OTHER): Payer: No Typology Code available for payment source

## 2022-06-20 VITALS — BP 162/105 | HR 81 | Temp 98.0°F | Resp 20 | Ht 69.0 in | Wt 217.2 lb

## 2022-06-20 DIAGNOSIS — K50114 Crohn's disease of large intestine with abscess: Secondary | ICD-10-CM | POA: Diagnosis not present

## 2022-06-20 MED ORDER — SODIUM CHLORIDE 0.9 % IV SOLN
5.0000 mg/kg | INTRAVENOUS | Status: DC
  Administered 2022-06-20: 500 mg via INTRAVENOUS
  Filled 2022-06-20: qty 50

## 2022-06-20 MED ORDER — METHYLPREDNISOLONE SODIUM SUCC 40 MG IJ SOLR
40.0000 mg | Freq: Once | INTRAMUSCULAR | Status: AC
  Administered 2022-06-20: 40 mg via INTRAVENOUS
  Filled 2022-06-20: qty 1

## 2022-06-20 MED ORDER — ACETAMINOPHEN 325 MG PO TABS
650.0000 mg | ORAL_TABLET | Freq: Once | ORAL | Status: AC
  Administered 2022-06-20: 650 mg via ORAL
  Filled 2022-06-20: qty 2

## 2022-06-20 MED ORDER — DIPHENHYDRAMINE HCL 25 MG PO CAPS
25.0000 mg | ORAL_CAPSULE | Freq: Once | ORAL | Status: AC
  Administered 2022-06-20: 25 mg via ORAL
  Filled 2022-06-20: qty 1

## 2022-06-20 NOTE — Progress Notes (Signed)
Diagnosis: Crohn's Disease  Provider:  Marshell Garfinkel MD  Procedure: Infusion  IV Type: Peripheral, IV Location: R Antecubital  Remicade (Infliximab), Dose: 500 mg  Infusion Start Time: 0923  Infusion Stop Time: 8389  Post Infusion IV Care: Peripheral IV Discontinued  Discharge: Condition: Good, Destination: Home . AVS provided to patient.   Performed by:  Adelina Mings, LPN

## 2022-06-25 ENCOUNTER — Other Ambulatory Visit (HOSPITAL_COMMUNITY): Payer: Self-pay

## 2022-07-01 ENCOUNTER — Encounter: Payer: Self-pay | Admitting: Gastroenterology

## 2022-07-04 ENCOUNTER — Other Ambulatory Visit (HOSPITAL_COMMUNITY): Payer: Self-pay

## 2022-07-04 MED ORDER — LOSARTAN POTASSIUM 50 MG PO TABS
50.0000 mg | ORAL_TABLET | Freq: Every day | ORAL | 3 refills | Status: DC
  Filled 2022-07-04: qty 90, 90d supply, fill #0

## 2022-07-06 ENCOUNTER — Encounter: Payer: Self-pay | Admitting: Gastroenterology

## 2022-07-16 ENCOUNTER — Other Ambulatory Visit (HOSPITAL_COMMUNITY): Payer: Self-pay

## 2022-07-18 ENCOUNTER — Ambulatory Visit (INDEPENDENT_AMBULATORY_CARE_PROVIDER_SITE_OTHER): Payer: No Typology Code available for payment source | Admitting: *Deleted

## 2022-07-18 VITALS — BP 167/105 | HR 78 | Temp 98.1°F | Resp 16 | Ht 69.0 in | Wt 216.0 lb

## 2022-07-18 DIAGNOSIS — K50114 Crohn's disease of large intestine with abscess: Secondary | ICD-10-CM

## 2022-07-18 MED ORDER — DIPHENHYDRAMINE HCL 25 MG PO CAPS
25.0000 mg | ORAL_CAPSULE | Freq: Once | ORAL | Status: AC
  Administered 2022-07-18: 25 mg via ORAL
  Filled 2022-07-18: qty 1

## 2022-07-18 MED ORDER — SODIUM CHLORIDE 0.9 % IV SOLN
5.0000 mg/kg | INTRAVENOUS | Status: DC
  Administered 2022-07-18: 500 mg via INTRAVENOUS
  Filled 2022-07-18: qty 50

## 2022-07-18 MED ORDER — METHYLPREDNISOLONE SODIUM SUCC 40 MG IJ SOLR
40.0000 mg | Freq: Once | INTRAMUSCULAR | Status: AC
  Administered 2022-07-18: 40 mg via INTRAVENOUS
  Filled 2022-07-18: qty 1

## 2022-07-18 MED ORDER — ACETAMINOPHEN 325 MG PO TABS
650.0000 mg | ORAL_TABLET | Freq: Once | ORAL | Status: AC
  Administered 2022-07-18: 650 mg via ORAL
  Filled 2022-07-18: qty 2

## 2022-07-18 NOTE — Progress Notes (Signed)
Diagnosis: Crohn's Disease  Provider:  Marshell Garfinkel MD  Procedure: Infusion  IV Type: Peripheral, IV Location: R Antecubital  Remicade (Infliximab), Dose: 500 mg  Infusion Start Time: 1146 am  Infusion Stop Time: 4314 am  Post Infusion IV Care: Observation period completed and Peripheral IV Discontinued  Discharge: Condition: Good, Destination: Home . AVS provided to patient.   Performed by:  Oren Beckmann, RN

## 2022-07-26 ENCOUNTER — Other Ambulatory Visit: Payer: Self-pay

## 2022-08-15 ENCOUNTER — Ambulatory Visit (INDEPENDENT_AMBULATORY_CARE_PROVIDER_SITE_OTHER): Payer: 59

## 2022-08-15 VITALS — BP 178/97 | HR 75 | Temp 98.1°F | Resp 16 | Ht 70.0 in | Wt 221.6 lb

## 2022-08-15 DIAGNOSIS — K50114 Crohn's disease of large intestine with abscess: Secondary | ICD-10-CM | POA: Diagnosis not present

## 2022-08-15 MED ORDER — DIPHENHYDRAMINE HCL 25 MG PO CAPS
25.0000 mg | ORAL_CAPSULE | Freq: Once | ORAL | Status: AC
  Administered 2022-08-15: 25 mg via ORAL
  Filled 2022-08-15: qty 1

## 2022-08-15 MED ORDER — METHYLPREDNISOLONE SODIUM SUCC 40 MG IJ SOLR
40.0000 mg | Freq: Once | INTRAMUSCULAR | Status: AC
  Administered 2022-08-15: 40 mg via INTRAVENOUS
  Filled 2022-08-15: qty 1

## 2022-08-15 MED ORDER — SODIUM CHLORIDE 0.9 % IV SOLN
5.0000 mg/kg | INTRAVENOUS | Status: DC
  Administered 2022-08-15: 500 mg via INTRAVENOUS
  Filled 2022-08-15: qty 50

## 2022-08-15 MED ORDER — ACETAMINOPHEN 325 MG PO TABS
650.0000 mg | ORAL_TABLET | Freq: Once | ORAL | Status: AC
  Administered 2022-08-15: 650 mg via ORAL
  Filled 2022-08-15: qty 2

## 2022-08-15 NOTE — Progress Notes (Signed)
Diagnosis: Crohn's Disease  Provider:  Marshell Garfinkel MD  Procedure: Infusion  IV Type: Peripheral, IV Location: R Forearm  Remicade (Infliximab), Dose: 500 mg  Infusion Start Time: 2458  Infusion Stop Time: 0998  Post Infusion IV Care: Peripheral IV Discontinued  Discharge: Condition: Good, Destination: Home . AVS provided to patient.   Performed by:  Koren Shiver, RN

## 2022-08-29 ENCOUNTER — Telehealth: Payer: Self-pay | Admitting: Pharmacy Technician

## 2022-08-29 ENCOUNTER — Other Ambulatory Visit: Payer: Self-pay | Admitting: Pharmacy Technician

## 2022-08-29 NOTE — Telephone Encounter (Addendum)
Auth Submission: APPROVED - [place] RENEWAL NEW INSURANCE Payer: [employer] Medication & CPT/J Code(s) submitted: Remicade (Infliximab) J1745 Route of submission (phone, fax, portal):  Phone [phone] Fax [phone] Auth type: Buy/Bill Units/visits requested: 500MG Q4WKS Reference number: [id number] Approval from:  08/29/22 - 02/26/23

## 2022-09-06 ENCOUNTER — Encounter: Payer: Self-pay | Admitting: Gastroenterology

## 2022-09-12 ENCOUNTER — Ambulatory Visit (INDEPENDENT_AMBULATORY_CARE_PROVIDER_SITE_OTHER): Payer: 59

## 2022-09-12 VITALS — BP 171/92 | HR 82 | Temp 97.9°F | Resp 18 | Ht 70.0 in | Wt 214.8 lb

## 2022-09-12 DIAGNOSIS — K50114 Crohn's disease of large intestine with abscess: Secondary | ICD-10-CM

## 2022-09-12 MED ORDER — ACETAMINOPHEN 325 MG PO TABS
650.0000 mg | ORAL_TABLET | Freq: Once | ORAL | Status: AC
  Administered 2022-09-12: 650 mg via ORAL
  Filled 2022-09-12: qty 2

## 2022-09-12 MED ORDER — SODIUM CHLORIDE 0.9 % IV SOLN
5.0000 mg/kg | Freq: Once | INTRAVENOUS | Status: AC
  Administered 2022-09-12: 500 mg via INTRAVENOUS
  Filled 2022-09-12: qty 50

## 2022-09-12 MED ORDER — DIPHENHYDRAMINE HCL 25 MG PO CAPS
25.0000 mg | ORAL_CAPSULE | Freq: Once | ORAL | Status: AC
  Administered 2022-09-12: 25 mg via ORAL
  Filled 2022-09-12: qty 1

## 2022-09-12 MED ORDER — METHYLPREDNISOLONE SODIUM SUCC 40 MG IJ SOLR
40.0000 mg | Freq: Once | INTRAMUSCULAR | Status: AC
  Administered 2022-09-12: 40 mg via INTRAVENOUS
  Filled 2022-09-12: qty 1

## 2022-09-12 NOTE — Progress Notes (Signed)
Diagnosis: Crohn's Disease  Provider:  Marshell Garfinkel MD  Procedure: Infusion  IV Type: Peripheral, IV Location: R Forearm  Remicade (Infliximab), Dose: 500 mg  Infusion Start Time: [id number]  Infusion Stop Time: [id number]  Post Infusion IV Care: Peripheral IV Discontinued  Discharge: Condition: Good, Destination: Home . AVS Provided and AVS Declined  Performed by:  Cleophus Molt, RN

## 2022-09-20 DIAGNOSIS — I2510 Atherosclerotic heart disease of native coronary artery without angina pectoris: Secondary | ICD-10-CM

## 2022-09-20 DIAGNOSIS — I219 Acute myocardial infarction, unspecified: Secondary | ICD-10-CM

## 2022-09-20 HISTORY — DX: Acute myocardial infarction, unspecified: I21.9

## 2022-09-20 HISTORY — DX: Atherosclerotic heart disease of native coronary artery without angina pectoris: I25.10

## 2022-10-10 ENCOUNTER — Ambulatory Visit (INDEPENDENT_AMBULATORY_CARE_PROVIDER_SITE_OTHER): Payer: 59

## 2022-10-10 VITALS — BP 172/100 | HR 78 | Temp 98.4°F | Resp 16 | Ht 70.0 in | Wt 212.4 lb

## 2022-10-10 DIAGNOSIS — K50114 Crohn's disease of large intestine with abscess: Secondary | ICD-10-CM

## 2022-10-10 MED ORDER — DIPHENHYDRAMINE HCL 25 MG PO CAPS
25.0000 mg | ORAL_CAPSULE | Freq: Once | ORAL | Status: AC
  Administered 2022-10-10: 25 mg via ORAL
  Filled 2022-10-10: qty 1

## 2022-10-10 MED ORDER — ACETAMINOPHEN 325 MG PO TABS
650.0000 mg | ORAL_TABLET | Freq: Once | ORAL | Status: AC
  Administered 2022-10-10: 650 mg via ORAL
  Filled 2022-10-10: qty 2

## 2022-10-10 MED ORDER — METHYLPREDNISOLONE SODIUM SUCC 40 MG IJ SOLR
40.0000 mg | Freq: Once | INTRAMUSCULAR | Status: AC
  Administered 2022-10-10: 40 mg via INTRAVENOUS
  Filled 2022-10-10: qty 1

## 2022-10-10 MED ORDER — SODIUM CHLORIDE 0.9 % IV SOLN
5.0000 mg/kg | Freq: Once | INTRAVENOUS | Status: AC
  Administered 2022-10-10: 500 mg via INTRAVENOUS
  Filled 2022-10-10: qty 50

## 2022-10-10 NOTE — Progress Notes (Signed)
Diagnosis: Crohn's Disease  Provider:  Marshell Garfinkel MD  Procedure: Infusion  IV Type: Peripheral, IV Location: R Antecubital  Remicade (Infliximab), Dose: 500 mg  Infusion Start Time: [id number]  Infusion Stop Time: 1151  Post Infusion IV Care: Patient declined observation and Peripheral IV Discontinued  Discharge: Condition: Good, Destination: Home . AVS Provided  Performed by:  Binnie Kand, RN

## 2022-10-15 ENCOUNTER — Telehealth: Payer: Self-pay

## 2022-10-16 NOTE — Telephone Encounter (Signed)
Per Dr Loletha Carrow schedule a follow up. I left a detailed message for Mr Sean Schaefer letting him know I booked this appointment for him. Appointment is 12-20-2022 @ 9:20 am

## 2022-10-19 ENCOUNTER — Inpatient Hospital Stay (HOSPITAL_COMMUNITY)
Admission: EM | Admit: 2022-10-19 | Discharge: 2022-10-22 | DRG: 322 | Disposition: A | Discharge status: Home / Self Care | Payer: 59 | Attending: Internal Medicine | Admitting: Internal Medicine

## 2022-10-19 ENCOUNTER — Other Ambulatory Visit: Payer: Self-pay

## 2022-10-19 ENCOUNTER — Emergency Department (HOSPITAL_COMMUNITY): Payer: 59

## 2022-10-19 DIAGNOSIS — I10 Essential (primary) hypertension: Secondary | ICD-10-CM | POA: Diagnosis present

## 2022-10-19 DIAGNOSIS — I2510 Atherosclerotic heart disease of native coronary artery without angina pectoris: Secondary | ICD-10-CM | POA: Diagnosis present

## 2022-10-19 DIAGNOSIS — E871 Hypo-osmolality and hyponatremia: Secondary | ICD-10-CM | POA: Diagnosis not present

## 2022-10-19 DIAGNOSIS — N179 Acute kidney failure, unspecified: Secondary | ICD-10-CM | POA: Diagnosis present

## 2022-10-19 DIAGNOSIS — E7800 Pure hypercholesterolemia, unspecified: Secondary | ICD-10-CM | POA: Diagnosis not present

## 2022-10-19 DIAGNOSIS — E119 Type 2 diabetes mellitus without complications: Secondary | ICD-10-CM | POA: Diagnosis not present

## 2022-10-19 DIAGNOSIS — K50114 Crohn's disease of large intestine with abscess: Secondary | ICD-10-CM | POA: Diagnosis not present

## 2022-10-19 DIAGNOSIS — Z794 Long term (current) use of insulin: Secondary | ICD-10-CM

## 2022-10-19 DIAGNOSIS — Z79899 Other long term (current) drug therapy: Secondary | ICD-10-CM | POA: Diagnosis not present

## 2022-10-19 DIAGNOSIS — E782 Mixed hyperlipidemia: Secondary | ICD-10-CM | POA: Diagnosis not present

## 2022-10-19 DIAGNOSIS — I213 ST elevation (STEMI) myocardial infarction of unspecified site: Secondary | ICD-10-CM | POA: Diagnosis not present

## 2022-10-19 DIAGNOSIS — I160 Hypertensive urgency: Secondary | ICD-10-CM | POA: Diagnosis present

## 2022-10-19 DIAGNOSIS — E1169 Type 2 diabetes mellitus with other specified complication: Secondary | ICD-10-CM | POA: Diagnosis present

## 2022-10-19 DIAGNOSIS — E876 Hypokalemia: Secondary | ICD-10-CM | POA: Diagnosis present

## 2022-10-19 DIAGNOSIS — K5010 Crohn's disease of large intestine without complications: Secondary | ICD-10-CM | POA: Diagnosis present

## 2022-10-19 DIAGNOSIS — K5090 Crohn's disease, unspecified, without complications: Secondary | ICD-10-CM | POA: Diagnosis present

## 2022-10-19 DIAGNOSIS — E1165 Type 2 diabetes mellitus with hyperglycemia: Secondary | ICD-10-CM

## 2022-10-19 DIAGNOSIS — Z9049 Acquired absence of other specified parts of digestive tract: Secondary | ICD-10-CM

## 2022-10-19 DIAGNOSIS — Z8249 Family history of ischemic heart disease and other diseases of the circulatory system: Secondary | ICD-10-CM

## 2022-10-19 DIAGNOSIS — R0789 Other chest pain: Secondary | ICD-10-CM | POA: Diagnosis not present

## 2022-10-19 DIAGNOSIS — R739 Hyperglycemia, unspecified: Secondary | ICD-10-CM | POA: Diagnosis not present

## 2022-10-19 DIAGNOSIS — Z955 Presence of coronary angioplasty implant and graft: Secondary | ICD-10-CM

## 2022-10-19 DIAGNOSIS — R000 Tachycardia, unspecified: Secondary | ICD-10-CM | POA: Diagnosis not present

## 2022-10-19 DIAGNOSIS — R079 Chest pain, unspecified: Secondary | ICD-10-CM | POA: Diagnosis not present

## 2022-10-19 DIAGNOSIS — Z833 Family history of diabetes mellitus: Secondary | ICD-10-CM | POA: Diagnosis not present

## 2022-10-19 DIAGNOSIS — I214 Non-ST elevation (NSTEMI) myocardial infarction: Principal | ICD-10-CM | POA: Diagnosis present

## 2022-10-19 DIAGNOSIS — I25110 Atherosclerotic heart disease of native coronary artery with unstable angina pectoris: Secondary | ICD-10-CM

## 2022-10-19 DIAGNOSIS — E785 Hyperlipidemia, unspecified: Secondary | ICD-10-CM | POA: Diagnosis present

## 2022-10-19 LAB — CBC WITH DIFFERENTIAL/PLATELET
Abs Immature Granulocytes: 0.04 10*3/uL (ref 0.00–0.07)
Basophils Absolute: 0 10*3/uL (ref 0.0–0.1)
Basophils Relative: 0 %
Eosinophils Absolute: 0 10*3/uL (ref 0.0–0.5)
Eosinophils Relative: 1 %
HCT: 38.8 % — ABNORMAL LOW (ref 39.0–52.0)
Hemoglobin: 13.1 g/dL (ref 13.0–17.0)
Immature Granulocytes: 1 %
Lymphocytes Relative: 14 %
Lymphs Abs: 1.2 10*3/uL (ref 0.7–4.0)
MCH: 32.5 pg (ref 26.0–34.0)
MCHC: 33.8 g/dL (ref 30.0–36.0)
MCV: 96.3 fL (ref 80.0–100.0)
Monocytes Absolute: 0.4 10*3/uL (ref 0.1–1.0)
Monocytes Relative: 5 %
Neutro Abs: 6.7 10*3/uL (ref 1.7–7.7)
Neutrophils Relative %: 79 %
Platelets: 161 10*3/uL (ref 150–400)
RBC: 4.03 MIL/uL — ABNORMAL LOW (ref 4.22–5.81)
RDW: 12.8 % (ref 11.5–15.5)
WBC: 8.3 10*3/uL (ref 4.0–10.5)
nRBC: 0 % (ref 0.0–0.2)

## 2022-10-19 LAB — URINALYSIS, ROUTINE W REFLEX MICROSCOPIC
Bacteria, UA: NONE SEEN
Bilirubin Urine: NEGATIVE
Glucose, UA: >=500 mg/dL — AB
Ketones, ur: 5 mg/dL — AB
Leukocytes,Ua: NEGATIVE
Nitrite: NEGATIVE
Protein, ur: 100 mg/dL — AB
Specific Gravity, Urine: 1.026 (ref 1.005–1.030)
pH: 5 (ref 5.0–8.0)

## 2022-10-19 LAB — CBG MONITORING, ED: Glucose-Capillary: 342 mg/dL — ABNORMAL HIGH (ref 70–99)

## 2022-10-19 LAB — COMPREHENSIVE METABOLIC PANEL
ALT: 17 U/L (ref 0–44)
AST: 50 U/L — ABNORMAL HIGH (ref 15–41)
Albumin: 3.4 g/dL — ABNORMAL LOW (ref 3.5–5.0)
Alkaline Phosphatase: 64 U/L (ref 38–126)
Anion gap: 13 (ref 5–15)
BUN: 21 mg/dL — ABNORMAL HIGH (ref 6–20)
CO2: 22 mmol/L (ref 22–32)
Calcium: 8.8 mg/dL — ABNORMAL LOW (ref 8.9–10.3)
Chloride: 96 mmol/L — ABNORMAL LOW (ref 98–111)
Creatinine, Ser: 1.59 mg/dL — ABNORMAL HIGH (ref 0.61–1.24)
GFR, Estimated: 51 mL/min — ABNORMAL LOW (ref >60)
Glucose, Bld: 451 mg/dL — ABNORMAL HIGH (ref 70–99)
Potassium: 4.3 mmol/L (ref 3.5–5.1)
Sodium: 131 mmol/L — ABNORMAL LOW (ref 135–145)
Total Bilirubin: 0.9 mg/dL (ref 0.3–1.2)
Total Protein: 8 g/dL (ref 6.5–8.1)

## 2022-10-19 LAB — TROPONIN I (HIGH SENSITIVITY)
Troponin I (High Sensitivity): 2264 ng/L (ref <18)
Troponin I (High Sensitivity): 7237 ng/L (ref <18)

## 2022-10-19 LAB — LIPASE, BLOOD: Lipase: 44 U/L (ref 11–51)

## 2022-10-19 MED ORDER — ACETAMINOPHEN 325 MG PO TABS: 650.0000 mg | ORAL_TABLET | Freq: Four times a day (QID) | ORAL | Status: DC | PRN

## 2022-10-19 MED ORDER — MORPHINE SULFATE (PF) 4 MG/ML IV SOLN
4.0000 mg | Freq: Once | INTRAVENOUS | Status: AC
  Administered 2022-10-19: 4 mg via INTRAVENOUS
  Filled 2022-10-19: qty 1

## 2022-10-19 MED ORDER — ASPIRIN 81 MG PO TBEC
81.0000 mg | DELAYED_RELEASE_TABLET | Freq: Every day | ORAL | Status: DC
  Administered 2022-10-20 – 2022-10-22 (×3): 81 mg via ORAL
  Filled 2022-10-19 (×3): qty 1

## 2022-10-19 MED ORDER — CARVEDILOL 12.5 MG PO TABS
12.5000 mg | ORAL_TABLET | Freq: Two times a day (BID) | ORAL | Status: DC
  Administered 2022-10-20 – 2022-10-22 (×5): 12.5 mg via ORAL
  Filled 2022-10-19 (×5): qty 1

## 2022-10-19 MED ORDER — ROSUVASTATIN CALCIUM 20 MG PO TABS
20.0000 mg | ORAL_TABLET | Freq: Every day | ORAL | Status: DC
  Administered 2022-10-20: 20 mg via ORAL
  Filled 2022-10-19: qty 1

## 2022-10-19 MED ORDER — HYDROMORPHONE HCL 1 MG/ML IJ SOLN
0.5000 mg | INTRAMUSCULAR | Status: DC | PRN
  Administered 2022-10-20: 1 mg via INTRAVENOUS
  Filled 2022-10-19: qty 1

## 2022-10-19 MED ORDER — HEPARIN (PORCINE) 25000 UT/250ML-% IV SOLN
1200.0000 [IU]/h | INTRAVENOUS | Status: DC
  Administered 2022-10-19 – 2022-10-20 (×2): 1200 [IU]/h via INTRAVENOUS
  Filled 2022-10-19 (×2): qty 250

## 2022-10-19 MED ORDER — HEPARIN BOLUS VIA INFUSION
4000.0000 [IU] | Freq: Once | INTRAVENOUS | Status: AC
  Administered 2022-10-19: 4000 [IU] via INTRAVENOUS
  Filled 2022-10-19: qty 4000

## 2022-10-19 MED ORDER — NITROGLYCERIN 2 % TD OINT
1.0000 [in_us] | TOPICAL_OINTMENT | Freq: Once | TRANSDERMAL | Status: AC
  Administered 2022-10-19: 1 [in_us] via TOPICAL
  Filled 2022-10-19: qty 1

## 2022-10-19 MED ORDER — ONDANSETRON HCL 4 MG/2ML IJ SOLN
4.0000 mg | Freq: Once | INTRAMUSCULAR | Status: AC
  Administered 2022-10-19: 4 mg via INTRAVENOUS
  Filled 2022-10-19: qty 2

## 2022-10-19 MED ORDER — SODIUM CHLORIDE 0.9 % IV BOLUS
500.0000 mL | Freq: Once | INTRAVENOUS | Status: AC
  Administered 2022-10-19: 500 mL via INTRAVENOUS

## 2022-10-19 MED ORDER — INSULIN GLARGINE-YFGN 100 UNIT/ML ~~LOC~~ SOLN
30.0000 [IU] | Freq: Every day | SUBCUTANEOUS | Status: DC
  Administered 2022-10-20 – 2022-10-21 (×3): 30 [IU] via SUBCUTANEOUS
  Filled 2022-10-19 (×5): qty 0.3

## 2022-10-19 MED ORDER — ACETAMINOPHEN 650 MG RE SUPP: 650.0000 mg | Freq: Four times a day (QID) | RECTAL | Status: DC | PRN

## 2022-10-19 MED ORDER — NITROGLYCERIN 0.4 MG SL SUBL
0.4000 mg | SUBLINGUAL_TABLET | SUBLINGUAL | Status: DC | PRN
  Administered 2022-10-19 – 2022-10-20 (×3): 0.4 mg via SUBLINGUAL
  Filled 2022-10-19 (×2): qty 1

## 2022-10-19 MED ORDER — ONDANSETRON HCL 4 MG/2ML IJ SOLN
4.0000 mg | Freq: Four times a day (QID) | INTRAMUSCULAR | Status: DC | PRN
  Administered 2022-10-20 (×2): 4 mg via INTRAVENOUS
  Filled 2022-10-19 (×2): qty 2

## 2022-10-19 MED ORDER — INSULIN ASPART 100 UNIT/ML IJ SOLN
20.0000 [IU] | Freq: Once | INTRAMUSCULAR | Status: AC
  Administered 2022-10-19: 20 [IU] via SUBCUTANEOUS

## 2022-10-19 MED ORDER — INSULIN ASPART 100 UNIT/ML IJ SOLN
0.0000 [IU] | INTRAMUSCULAR | Status: DC
  Administered 2022-10-20: 3 [IU] via SUBCUTANEOUS
  Administered 2022-10-20: 7 [IU] via SUBCUTANEOUS

## 2022-10-19 MED ORDER — OXYCODONE HCL 5 MG PO TABS: 5.0000 mg | ORAL_TABLET | ORAL | Status: DC | PRN

## 2022-10-19 MED ORDER — SODIUM CHLORIDE 0.9 % IV SOLN: INTRAVENOUS | Status: DC

## 2022-10-19 MED ORDER — METOPROLOL TARTRATE 5 MG/5ML IV SOLN: 2.5000 mg | Freq: Four times a day (QID) | INTRAVENOUS | Status: DC | PRN

## 2022-10-19 MED ORDER — HYDRALAZINE HCL 20 MG/ML IJ SOLN: 5.0000 mg | INTRAMUSCULAR | Status: DC | PRN

## 2022-10-19 NOTE — ED Triage Notes (Signed)
Pt brought in by [facility] from urgent care, code stemi prior to arrival.

## 2022-10-19 NOTE — Assessment & Plan Note (Signed)
Non-ST elevated myocardial infarction Patient is currently hemodynamically stable and chest pain control Initial high-sensitivity troponin 2264->7000s, patient received aspirin from EMS Plan: Heparin infusion, nitroglycerin tablet and/or paste for pain relief in addition to narcotic analgesia, serial troponins ordered, continue aspirin and statin, defer any second antiplatelet agent to cardiology,

## 2022-10-19 NOTE — Consult Note (Signed)
Cardiology Consultation   Patient ID: Sean Schaefer MRN: [id number]; DOB: [birth year]  Admit date: 10/19/2022 Date of Consult: 10/19/2022  PCP:  Renford Dills, MD   [facility] Providers Cardiologist:  None        Patient Profile:   Sean Schaefer is a 57 y.o. male with a hx of insulin-dependent diabetes, hypertension, Crohn's disease,  and hypercholesteremia who is being seen 10/19/2022 for the evaluation of chest pain at the request of Hospital Medicine.  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  The pain described as pressure-like, non-radiating, and associated with feeling like he was going to faint, nausea and vomiting (vomiting while in ambulance).  He denies any other associated symptoms such as shortness of breath, sweating, palpitations, or sweating.  Due to ongoing chest pain, he decided to drive himself to his local acute care clinic.  Emergency medical services was called to the clinic and patient was brought to our facility due to concern for STEMI that was cancelled.  He is now reporting 1/10 chest pain.  Patient denies previous chest pain issues.  Patient was given high dose aspirin by emergency medical services.  Patient was placed on heparin drip in the emergency room.  ECG performed did not demonstrate STEMI.  Initial HS-troponin was elevated at 2,264 ng/L.  Initial blood pressure reported was 185/126mmHg.d   Past Medical History:  Diagnosis Date   Crohn's disease (HCC)    Diabetes mellitus without complication (HCC)    Hypertension    Ulcerative colitis (HCC)     Past Surgical History:  Procedure Laterality Date   COLON SURGERY     COLONOSCOPY     EYE SURGERY Left    FLEXIBLE SIGMOIDOSCOPY N/A 02/05/2021   Procedure: FLEXIBLE POUCHOSCOPY WITH BIOPSY;  Surgeon: Andria Meuse, MD;  Location: [facility];  Service: General;  Laterality:  N/A;   INCISION AND DRAINAGE ABSCESS N/A 02/05/2021   Procedure: INCISION AND DRAINAGE OF PERIANAL ABSCESS;  Surgeon: Andria Meuse, MD;  Location: [facility];  Service: General;  Laterality: N/A;   INCISION AND DRAINAGE PERIRECTAL ABSCESS N/A 11/30/2020   Procedure: IRRIGATION AND DEBRIDEMENT PERIRECTAL ABSCESS;  Surgeon: Fritzi Mandes, MD;  Location: [facility];  Service: General;  Laterality: N/A;   INCISION AND DRAINAGE PERIRECTAL ABSCESS Left 04/03/2021   Procedure: left peri-rectal/gluteal abscess;  Surgeon: Diamantina Monks, MD;  Location: [facility];  Service: General;  Laterality: Left;   Perianal fistula repair  07/22/2010   PLACEMENT OF SETON N/A 02/05/2021   Procedure: PLACEMENT OF SETONS X2;  Surgeon: Andria Meuse, MD;  Location: [facility];  Service: General;  Laterality: N/A;   RECTAL EXAM UNDER ANESTHESIA N/A 02/05/2021   Procedure: ANORECTAL EXAM UNDER ANESTHESIA;  Surgeon: Andria Meuse, MD;  Location: [facility];  Service: General;  Laterality: N/A;   TESTICLE REMOVAL Left 2000       Inpatient Medications: Scheduled Meds:  Insulin Pen Needle  1 packet Subcutaneous QHS    morphine injection  4 mg Intravenous Once   Continuous Infusions:  sodium chloride     heparin 1,200 Units/hr (10/19/22 2046)   PRN Meds: nitroGLYCERIN  Allergies:    Allergies  Allergen Reactions   Statins Other (See Comments)    Myalgias  Currently taking Crestor 10mg .    Social History:   Social History   Socioeconomic History  Marital status: Married    Spouse name: Not on file   Number of children: 2   Years of education: Not on file   Highest education level: Not on file  Occupational History   Not on file  Tobacco Use   Smoking status: Never   Smokeless tobacco: Never  Vaping Use   Vaping Use: Never used  Substance and Sexual Activity   Alcohol use: No   Drug use: No   Sexual activity: Yes  Other Topics Concern   Not on file  Social History Narrative   Not on  file   Social Determinants of Health   Financial Resource Strain: Not on file  Food Insecurity: Not on file  Transportation Needs: Not on file  Physical Activity: Not on file  Stress: Not on file  Social Connections: Not on file  Intimate Partner Violence: Not on file    Family History:   Non-contributory Family History  Problem Relation Age of Onset   Hypertension Mother    Cancer Mother    Hypertension Father    Inflammatory bowel disease Brother    Diabetes Paternal Uncle    Diabetes Paternal Grandmother    Diabetes Paternal Grandfather    Heart disease Neg Hx    Colon cancer Neg Hx    Esophageal cancer Neg Hx    Rectal cancer Neg Hx    Stomach cancer Neg Hx      ROS:  Please see the history of present illness.  All other ROS reviewed and negative.     Physical Exam/Data:   Vitals:   10/19/22 1910 10/19/22 1930 10/19/22 2000 10/19/22 2030  BP: (!) 148/93 (!) 171/103 (!) 187/112 (!) 197/109  Pulse: 94 89 96 84  Resp: 16 12 (!) 25 13  SpO2: 100% 100% 100% 100%  Weight:      Height:       No intake or output data in the 24 hours ending 10/19/22 2107    10/19/2022    7:02 PM 10/10/2022    8:33 AM 09/12/2022    8:36 AM  Last 3 Weights  Weight (lbs) 214 lb 212 lb 6.4 oz 214 lb 12.8 oz  Weight (kg) 97.07 kg 96.344 kg 97.433 kg     Body mass index is 30.71 kg/m.  General:  Well nourished, well developed, in no acute distress HEENT: normal Neck: no JVD Vascular: No carotid bruits; Distal pulses 2+ bilaterally Cardiac:  normal S1, S2; RRR; no murmur  Lungs:  clear to auscultation bilaterally, no wheezing, rhonchi or rales  Abd: midline surgical scar, soft, nontender, no hepatomegaly  Ext: no edema Musculoskeletal:  No deformities, BUE and BLE strength normal and equal Skin: warm and dry  Neuro:  CNs 2-12 intact, no focal abnormalities noted Psych:  Normal affect   EKG:  The EKG was personally reviewed and demonstrates:  12/19/22 (20:01hrs):  NSR; LVH;  suspect precordial lead reversal Telemetry:  Telemetry was personally reviewed and demonstrates:  sinus tachycardia  Relevant CV Studies: None  Laboratory Data:  High Sensitivity Troponin:   Recent Labs  Lab 10/19/22 1830  TROPONINIHS 2,264*     Chemistry Recent Labs  Lab 10/19/22 1830  NA 131*  K 4.3  CL 96*  CO2 22  GLUCOSE 451*  BUN 21*  CREATININE 1.59*  CALCIUM 8.8*  GFRNONAA 51*  ANIONGAP 13    Recent Labs  Lab 10/19/22 1830  PROT 8.0  ALBUMIN 3.4*  AST 50*  ALT 17  ALKPHOS  64  BILITOT 0.9   Lipids No results for input(s): "CHOL", "TRIG", "HDL", "LABVLDL", "LDLCALC", "CHOLHDL" in the last 168 hours.  Hematology Recent Labs  Lab 10/19/22 1830  WBC 8.3  RBC 4.03*  HGB 13.1  HCT 38.8*  MCV 96.3  MCH 32.5  MCHC 33.8  RDW 12.8  PLT 161   Thyroid No results for input(s): "TSH", "FREET4" in the last 168 hours.  BNPNo results for input(s): "BNP", "PROBNP" in the last 168 hours.  DDimer No results for input(s): "DDIMER" in the last 168 hours.   Radiology/Studies:  DG Chest Portable 1 View  Result Date: 10/19/2022 CLINICAL DATA:  Chest pain EXAM: PORTABLE CHEST 1 VIEW COMPARISON:  12/02/2013 FINDINGS: The heart size and mediastinal contours are within normal limits. Both lungs are clear. The visualized skeletal structures are unremarkable. IMPRESSION: Normal study. Electronically Signed   By: Charlett Nose M.D.   On: 10/19/2022 19:03     Assessment and Plan:   Chest pain: Patient with chest pain concerning for acute angina in the setting of an elevated HS-troponin, making NSTEMI-ACS likely etiology.  Despite elevated blood pressures, hypertensive emergency is unlikely driving acute issue but cannot be excluded.   ECG without sings of ischemia/infarction.  Chest pain now 1/10 in intensity and patient is hemodynamically stable.  Patient has received high-dose aspirin and started on heparin drip. --Will arrange for coronary angiography Monday if patient  remains stable and becomes chest  pain free. --Please at least initiate nitroglycerin paste prior to starting heparin drip. --Please start carvedilol 12.5mg  for better rate control and improve blood pressure.   --Echocardiogram has been ordered. --Please address blood pressure with IV antihypertensive medications if needed.    Hypertension: Known primary hypertension without overt end-organ damage initially, but appears to have possible acute kidney injury.  Blood pressure at this time is uncontrolled. Taking losartan prior. --Consider holding losartan given acute increase in creatinine and starting patient on amlodipine and carvedilol as above. --If ongoing chest pain, reasonable to initiate nitroglycerin drip.    Type II diabetes: Insulin-dependent diabetes no with NSTEMI-ACS.   --Management per primary team. --Please consider initiating empagliflozen for cardioprotection long-term, but hold night prior to coronary angiography.    Risk Assessment/Risk Scores:     TIMI Risk Score for Unstable Angina or Non-ST Elevation MI:   The patient's TIMI risk score is 2, which indicates a 8% risk of all cause mortality, new or recurrent myocardial infarction or need for urgent revascularization in the next 14 days.          For questions or updates, please contact [facility] Please consult [email or web address] for contact info under    Signed, Judie Grieve, MD  10/19/2022 9:07 PM

## 2022-10-19 NOTE — ED Notes (Signed)
Pt care taken, pt is nauseous and said that the nitro did not help

## 2022-10-19 NOTE — ED Provider Notes (Signed)
Emergency Department Provider Note   I have reviewed the triage vital signs and the nursing notes.   HISTORY  Chief Complaint No chief complaint on file.   HPI Sean Schaefer is a 57 y.o. male with PMH of DM and HTN presents to the ED with CP, nausea/vomiting, and diaphoresis.  Patient was at an event where there was food.  He was eating when he suddenly had 10 out of 10 chest pain with nausea/vomiting.  He became concerned and initially drove himself to urgent care.  They activated 911 who transported him here for evaluation.  According to EMS he was given 325 mg of aspirin, nitroglycerin, Zofran prior to arrival.  He was diaphoretic with large volume emesis and route.  They were monitoring his EKG and in route had some septal ST elevation and they activated a code STEMI from the field.   On arrival to the emergency department, the patient's pain is currently 1 out of 10.  No shortness of breath.  Denies abdominal pain or nausea at this time.    Past Medical History:  Diagnosis Date   Crohn's disease ([place])    Diabetes mellitus without complication ([place])    Hypertension    Ulcerative colitis ([place])     Review of Systems  Constitutional: No fever/chills Eyes: No visual changes. ENT: No sore throat. Cardiovascular: Positive chest pain. Respiratory: Denies shortness of breath. Gastrointestinal: No abdominal pain. Positive nausea and vomiting.  No diarrhea.  No constipation. Genitourinary: Negative for dysuria. Musculoskeletal: Negative for back pain. Skin: Negative for rash. Neurological: Negative for headaches, focal weakness or numbness.  ____________________________________________   PHYSICAL EXAM:  VITAL SIGNS: ED Triage Vitals  Enc Vitals Group     BP      Pulse      Resp      Temp      Temp src      SpO2      Weight      Height      Head Circumference      Peak Flow      Pain Score      Pain Loc      Pain Edu?      Excl. in [place]?    Constitutional:  Alert and oriented. Well appearing and in no acute distress. Eyes: Conjunctivae are normal.  Head: Atraumatic. Nose: No congestion/rhinnorhea. Mouth/Throat: Mucous membranes are moist.  Neck: No stridor.   Cardiovascular: Normal rate, regular rhythm. Good peripheral circulation. Grossly normal heart sounds.   Respiratory: Normal respiratory effort.  No retractions. Lungs CTAB. Gastrointestinal: Soft and nontender. No distention.  Musculoskeletal: No lower extremity tenderness nor edema. No gross deformities of extremities. Neurologic:  Normal speech and language. No gross focal neurologic deficits are appreciated.  Skin:  Skin is warm, dry and intact. No rash noted. {**Psychiatric: Mood and affect are normal. Speech and behavior are normal.**}  ____________________________________________   LABS (all labs ordered are listed, but only abnormal results are displayed)  Labs Reviewed - No data to display ____________________________________________  EKG  *** ____________________________________________  RADIOLOGY  No results found.  ____________________________________________   PROCEDURES  Procedure(s) performed:   Procedures   ____________________________________________   INITIAL IMPRESSION / ASSESSMENT AND PLAN / ED COURSE  Pertinent labs & imaging results that were available during my care of the patient were reviewed by me and considered in my medical decision making (see chart for details).   This patient is Presenting for Evaluation of ***, which {  Range:23949} require a range of treatment options, and {MDMcomplaint:23950} a complaint that involves a {MDMlevelrisk:23951} risk of morbidity and mortality.  The Differential Diagnoses include***.  Critical Interventions-    Medications - No data to display  Reassessment after intervention:     I *** Additional Historical Information from ***, as the patient is ***.  I decided to review pertinent External  Data, and in summary ***.   Clinical Laboratory Tests Ordered, included   Radiologic Tests Ordered, included ***. I independently interpreted the images and agree with radiology interpretation.   Cardiac Monitor Tracing which shows ***   Social Determinants of Health Risk ***  Consult complete with  Medical Decision Making: Summary:    06:55 PM Made aware by nursing staff the patient's chest pain is returned.  Planning for repeat EKG.   Reevaluation with update and discussion with   ***Considered admission***  Patient's presentation is most consistent with {EM COPA:27473}   Disposition:   ____________________________________________  FINAL CLINICAL IMPRESSION(S) / ED DIAGNOSES  Final diagnoses:  None     NEW OUTPATIENT MEDICATIONS STARTED DURING THIS VISIT:  New Prescriptions   No medications on file    Note:  This document was prepared using Dragon voice recognition software and may include unintentional dictation errors.  Nanda Quinton, MD, [facility] Emergency Medicine

## 2022-10-19 NOTE — H&P (Signed)
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  Patient denies any diaphoresis he denies any previous anginal-like symptoms and he denies any recent changes in his exertional tolerance.  Of note the patient denies any orthopnea or dependent edema.  Additionally, the patient reports he has both difficulties with his inflammatory bowel disease as well as uncontrolled diabetes mellitus noting his recent A1c was greater than 13%.  He denies any family history of coronary artery disease but of note his father required stenting of peripheral arterial disease.  The patient initially presented from an urgent care via EMS as a STEMI alert the patient was seen by cardiology in the emergency department, but given the patient's medical comorbidities they requested the hospitalist admitted as primary.  The patient noted he wished to be full code and receive CPR or defibrillation if needed.  Review of Systems:  Constitutional: Denies Weight Loss, Fever, Chills or Night Sweats Respiratory: Denies Shortness of Breath, Cough, Hemoptysis, Wheezing, Pleurisy Cardiovascular: Reports Chest Pain but denies Paroxsymal Nocturnal Dyspnea, Palpitations, Edema Gastrointestinal: Denies Nausea, Vomiting, Diarrhea, Hematemesis, Melena All other systems were reviewed and are negative  Past Medical History:  Diagnosis Date   Crohn's disease ([place])     Diabetes mellitus without complication ([place])    Hypertension    Ulcerative colitis ([place])    Past Surgical History:  Procedure Laterality Date   COLON SURGERY     COLONOSCOPY     EYE SURGERY Left    FLEXIBLE SIGMOIDOSCOPY N/A 02/05/2021   Procedure: FLEXIBLE POUCHOSCOPY WITH BIOPSY;  Surgeon: Ileana Roup, MD;  Location: [facility];  Service: General;  Laterality: N/A;   INCISION AND DRAINAGE ABSCESS N/A 02/05/2021   Procedure: INCISION AND DRAINAGE OF PERIANAL ABSCESS;  Surgeon: Ileana Roup, MD;  Location: [facility];  Service: General;  Laterality: N/A;   INCISION AND DRAINAGE PERIRECTAL ABSCESS N/A 11/30/2020   Procedure: IRRIGATION AND DEBRIDEMENT PERIRECTAL ABSCESS;  Surgeon: Dwan Bolt, MD;  Location: [place];  Service: General;  Laterality: N/A;   INCISION AND DRAINAGE PERIRECTAL ABSCESS Left 04/03/2021   Procedure: left peri-rectal/gluteal abscess;  Surgeon: Jesusita Oka, MD;  Location: [place];  Service: General;  Laterality: Left;   Perianal fistula repair  07/22/2010   PLACEMENT OF SETON N/A 02/05/2021   Procedure: PLACEMENT OF SETONS X2;  Surgeon: Ileana Roup, MD;  Location: [facility];  Service: General;  Laterality: N/A;   RECTAL EXAM UNDER ANESTHESIA N/A 02/05/2021   Procedure: ANORECTAL EXAM UNDER ANESTHESIA;  Surgeon: Ileana Roup, MD;  Location: [facility];  Service: General;  Laterality: N/A;   TESTICLE REMOVAL Left 2000   Social History:  reports that he has never smoked. He has never used smokeless tobacco. He reports that he does not drink alcohol and does not use drugs.  Allergies  Allergen Reactions   Statins Other (See Comments)    Myalgias  Currently taking Crestor 10mg .    Family History  Problem Relation Age of Onset   Hypertension Mother    Cancer Mother    Hypertension Father    Inflammatory bowel disease Brother    Diabetes Paternal Uncle    Diabetes Paternal Grandmother    Diabetes Paternal Grandfather    Heart disease  Neg Hx    Colon cancer Neg Hx    Esophageal cancer Neg Hx    Rectal cancer Neg Hx    Stomach cancer Neg Hx     Prior to Admission medications   Medication Sig Start Date End Date Taking? Authorizing Provider  insulin degludec (TRESIBA FLEXTOUCH) 100 UNIT/ML FlexTouch Pen Inject 38 units into the skin once a day 90 days Patient taking differently: Inject 38 Units into the skin at bedtime. 01/14/22  Yes   rosuvastatin (CRESTOR) 10 MG tablet Take 1 tablet (10 mg total) by mouth daily. 01/11/22  Yes     Physical Exam: Vitals:   10/19/22 2100 10/19/22 2130 10/19/22 2240 10/20/22 0000  BP: (!) 205/118 (!) 159/92 132/84 121/70  Pulse: (!) 127 (!) 101 (!) 110 100  Resp: (!) 22 11 18 18   Temp:  98.2 F (36.8 C) 97.7 F (36.5 C) 97.9 F (36.6 C)  TempSrc:   Oral Oral  SpO2: 100% 96% 99% 98%  Weight:      Height:      Constitutional:  Vital Signs as per [facility] than three noted] No Acute Distress Eyes:  Pink Conjunctiva and no Ptosis ENMT:   External Appearance of Ears and Nose without obvious deformity, masses or scar             Examination of teeth lips and gums: satisfactory dentition Neck:     Trachea Midline, Neck Symmetric             Thyroid without tenderness, palpable masses or nodules Respiratory:   Respiratory Effort Normal: No Use of Respiratory Muscles,No  Intercostal Retractions             Lungs Clear to Auscultation Bilaterally Cardiovascular:   Heart Auscultated: Regular Regular without any added sounds or murmurs              No Lower Extremity Edema Gastrointestinal:  Abdomen soft and nontender without palpable masses, guarding or rebound  No Palpable Splenomegaly or Hepatomegaly Multiple Scars from previous colectomy including midline Lymphatic:  No Palpable Cervical Lymphadenopathy or Palpable No Axillary Lymphadenopathy Psychiatric:  Patient Orientated to Time, Place and Person Patient with appropriate mood and affect Recent and Remote Memory  Intact   Data Reviewed: ECG at 2001 p.m. independently reviewed and interpreted the patient has a sinus rhythm ventricular rate 91 QTc 429 with stable ST segments of note V2 and V4 changes similar to to ECG earlier in the day  Assessment and Plan: * NSTEMI (non-ST elevated myocardial infarction) ([place]) Non-ST elevated myocardial infarction Patient is currently hemodynamically stable and chest pain control Initial high-sensitivity troponin 2264->7000s, patient received aspirin from EMS Plan: Heparin infusion, nitroglycerin tablet and/or paste for pain relief in addition to narcotic analgesia, serial troponins ordered, continue aspirin and statin, defer any second antiplatelet agent to cardiology,  Hyperlipidemia  Increasing home rosuvastatin to 20 mg from 10 mg (his "allergy is myalgia currently taking 10 mg)  Hypertensive urgency Do not believe his chest pain is a primary hypertensive aspect although giving nitro which will lower his pressure we will follow closely holding home ACE inhibitor due to the AKI, will add another agent as needed following post  nitro, start Coreg 12.5 mg as well as IV hydralazine as needed for SBP >127mmhg, will follow closely so we do not pressures quickly and precipitate further myocardial ischemia  Update at 11:32 PM: Note significant blood pressure improvement slightly overcorrection along with tachycardia will change from hydralazine to IV metoprolol  AKI (acute kidney injury) ([place]) Creatinine is 1.59, increased from 1.02 to 1-year ago Suspect element of hypovolemic, of note patient has significant proteinuria, holding home ACE as well as NSAID Will repeat BMP in the morning  Hyponatremia Sodium is 131, likely pseudohyponatremia in the setting of hyperglycemia Suspect this is hypovolemic in nature secondary to osmotic diuresis Will provide normal saline IV  Uncontrolled type 2 diabetes mellitus with hyperglycemia, with long-term current use of insulin  ([place]) Type 2 diabetes mellitus with hyperglycemia Patient's glucose is 451->340s, no gap although some ketones in the urine Patient reports taking Humalog 10 units with meals and Tresiba 38 units at night Will keep the patient n.p.o., repeat BMP in 4 hours time to ensure no diabetic emergency We will give Tresiba 30 units now given he is n.p.o., hold prandial insulin, give 20 units aspart and then continue the every 4 hours sliding scale fraction, aiming for 140-180    Crohn's disease ([place]) Inflammatory bowel disease Holding infliximab and azathioprine Clarification:above as per patient concern, counseled that infliximab specifically controversial association with CAD Review as close interval to restart both when patient agreeable, counseled on the risk IBD gives for CAD as a chronic proinflammatory state        Advance Care Planning:   Code Status: Full Code   Consults: Cardiology  Family Communication: None as per patient will update wife  Severity of Illness: The appropriate patient status for this patient is INPATIENT. Inpatient status is judged to be reasonable and necessary in order to provide the required intensity of service to ensure the patient's safety. The patient's presenting symptoms, physical exam findings, and initial radiographic and laboratory data in the context of their chronic comorbidities is felt to place them at high risk for further clinical deterioration. Furthermore, it is not anticipated that the patient will be medically stable for discharge from the hospital within 2 midnights of admission.   * I certify that at the point of admission it is my clinical judgment that the patient will require inpatient hospital care spanning beyond 2 midnights from the point of admission due to high intensity of service, high risk for further deterioration and high frequency of surveillance required.*  Upon my evaluation, this patient had a high probability of imminent or  life-threatening deterioration due to Non-ST Elevated Myocardial Infarction, which required my direct attention, intervention, and personal management. I have personally provided 35 minutes of critical care time exclusive of time spent on separately billable procedures. Time includes review of laboratory data, radiology results, discussion with consultants, and monitoring for potential decompensation. Interventions were performed as documented above.   Author: Joelyn Oms, MD 10/20/2022 4:09 AM  For on call review [email or web address].

## 2022-10-19 NOTE — ED Notes (Signed)
Pt is resting now, pain is a 2 on the scale of 1-10, no complaints at this time.

## 2022-10-19 NOTE — Assessment & Plan Note (Signed)
Do not believe his chest pain is a primary hypertensive aspect although giving nitro which will lower his pressure we will follow closely holding home ACE inhibitor due to the AKI, will add another agent as needed following post nitro, start Coreg 12.5 mg as well as IV hydralazine as needed for SBP >131mmhg, will follow closely so we do not pressures quickly and precipitate further myocardial ischemia  Update at 11:32 PM: Note significant blood pressure improvement slightly overcorrection along with tachycardia will change from hydralazine to IV metoprolol

## 2022-10-19 NOTE — Assessment & Plan Note (Signed)
Creatinine is 1.59, increased from 1.02 to 1-year ago Suspect element of hypovolemic, of note patient has significant proteinuria, holding home ACE as well as NSAID Will repeat BMP in the morning

## 2022-10-19 NOTE — Progress Notes (Signed)
ANTICOAGULATION CONSULT NOTE - Initial Consult  Pharmacy Consult for Heparin Indication: chest pain/ACS  Allergies  Allergen Reactions   Statins     Other reaction(s): Unknown    Patient Measurements: Height: 5\' 10"  (177.8 cm) Weight: 97.1 kg (214 lb) IBW/kg (Calculated) : 73 Heparin Dosing Weight: 93 kg  Vital Signs: BP: 148/93 (03/30 1910) Pulse Rate: 94 (03/30 1910)  Labs: Recent Labs    10/19/22 1830  HGB 13.1  HCT 38.8*  PLT 161  CREATININE 1.59*  TROPONINIHS 2,264*    Estimated Creatinine Clearance: 62.1 mL/min (A) (by C-G formula based on SCr of 1.59 mg/dL (H)).   Medical History: Past Medical History:  Diagnosis Date   Crohn's disease ([place])    Diabetes mellitus without complication ([place])    Hypertension    Ulcerative colitis ([place])     Medications:  (Not in a hospital admission)  Scheduled:   Insulin Pen Needle  1 packet Subcutaneous QHS   Infusions:  PRN: nitroGLYCERIN  Assessment: 7 yom with a history of Crohn's disease, DM, HTN, UC. Patient is presenting with chest pain with N/V and diaphoresis. STEMI activation pre-hospital. Decision made on arrival to not rush to cath lab. Heparin per pharmacy consult placed for chest pain/ACS.  Patient is not on anticoagulation prior to arrival.  Hgb 13.1; plt 161 hsTrop 2264  Goal of Therapy:  Heparin level 0.3-0.7 units/ml Monitor platelets by anticoagulation protocol: Yes   Plan:  Give IV heparin 4000 units bolus x 1 Start heparin infusion at 1200 units/hr Check anti-Xa level at 0300 Daily heparin level while on heparin Continue to monitor H&H and platelets  Lorelei Pont, PharmD, BCPS 10/19/2022 8:14 PM ED Clinical Pharmacist -  [phone]

## 2022-10-19 NOTE — Assessment & Plan Note (Addendum)
Type 2 diabetes mellitus with hyperglycemia Patient's glucose is 451->340s, no gap although some ketones in the urine Patient reports taking Humalog 10 units with meals and Tresiba 38 units at night Will keep the patient n.p.o., repeat BMP in 4 hours time to ensure no diabetic emergency We will give Tresiba 30 units now given he is n.p.o., hold prandial insulin, give 20 units aspart and then continue the every 4 hours sliding scale fraction, aiming for 140-180

## 2022-10-19 NOTE — Assessment & Plan Note (Addendum)
  Increasing home rosuvastatin to 20 mg from 10 mg (his "allergy is myalgia currently taking 10 mg)

## 2022-10-19 NOTE — Assessment & Plan Note (Signed)
Inflammatory bowel disease Holding infliximab and azathioprine Clarification:above as per patient concern, counseled that infliximab specifically controversial association with CAD Review as close interval to restart both when patient agreeable, counseled on the risk IBD gives for CAD as a chronic proinflammatory state

## 2022-10-19 NOTE — Assessment & Plan Note (Signed)
Sodium is 131, likely pseudohyponatremia in the setting of hyperglycemia Suspect this is hypovolemic in nature secondary to osmotic diuresis Will provide normal saline IV

## 2022-10-20 ENCOUNTER — Encounter (HOSPITAL_COMMUNITY): Payer: Self-pay | Admitting: Internal Medicine

## 2022-10-20 ENCOUNTER — Encounter (HOSPITAL_COMMUNITY)
Admission: EM | Disposition: A | Discharge status: Home / Self Care | Payer: Self-pay | Source: Home / Self Care | Attending: Internal Medicine

## 2022-10-20 DIAGNOSIS — I25110 Atherosclerotic heart disease of native coronary artery with unstable angina pectoris: Secondary | ICD-10-CM | POA: Diagnosis not present

## 2022-10-20 DIAGNOSIS — I214 Non-ST elevation (NSTEMI) myocardial infarction: Secondary | ICD-10-CM | POA: Diagnosis not present

## 2022-10-20 DIAGNOSIS — I2510 Atherosclerotic heart disease of native coronary artery without angina pectoris: Secondary | ICD-10-CM

## 2022-10-20 HISTORY — PX: CORONARY BALLOON ANGIOPLASTY: CATH118233

## 2022-10-20 HISTORY — PX: CORONARY STENT INTERVENTION: CATH118234

## 2022-10-20 HISTORY — PX: LEFT HEART CATH AND CORONARY ANGIOGRAPHY: CATH118249

## 2022-10-20 LAB — GLUCOSE, CAPILLARY
Glucose-Capillary: 128 mg/dL — ABNORMAL HIGH (ref 70–99)
Glucose-Capillary: 130 mg/dL — ABNORMAL HIGH (ref 70–99)
Glucose-Capillary: 167 mg/dL — ABNORMAL HIGH (ref 70–99)
Glucose-Capillary: 221 mg/dL — ABNORMAL HIGH (ref 70–99)
Glucose-Capillary: 98 mg/dL (ref 70–99)

## 2022-10-20 LAB — COMPREHENSIVE METABOLIC PANEL
ALT: 27 U/L (ref 0–44)
AST: 163 U/L — ABNORMAL HIGH (ref 15–41)
Albumin: 3.5 g/dL (ref 3.5–5.0)
Alkaline Phosphatase: 65 U/L (ref 38–126)
Anion gap: 14 (ref 5–15)
BUN: 17 mg/dL (ref 6–20)
CO2: 22 mmol/L (ref 22–32)
Calcium: 9 mg/dL (ref 8.9–10.3)
Chloride: 100 mmol/L (ref 98–111)
Creatinine, Ser: 1.25 mg/dL — ABNORMAL HIGH (ref 0.61–1.24)
GFR, Estimated: >60 mL/min (ref >60)
Glucose, Bld: 198 mg/dL — ABNORMAL HIGH (ref 70–99)
Potassium: 3.6 mmol/L (ref 3.5–5.1)
Sodium: 136 mmol/L (ref 135–145)
Total Bilirubin: 0.9 mg/dL (ref 0.3–1.2)
Total Protein: 7.7 g/dL (ref 6.5–8.1)

## 2022-10-20 LAB — CBC
HCT: 38.4 % — ABNORMAL LOW (ref 39.0–52.0)
Hemoglobin: 13.9 g/dL (ref 13.0–17.0)
MCH: 33.8 pg (ref 26.0–34.0)
MCHC: 36.2 g/dL — ABNORMAL HIGH (ref 30.0–36.0)
MCV: 93.4 fL (ref 80.0–100.0)
Platelets: 161 10*3/uL (ref 150–400)
RBC: 4.11 MIL/uL — ABNORMAL LOW (ref 4.22–5.81)
RDW: 12.9 % (ref 11.5–15.5)
WBC: 7.9 10*3/uL (ref 4.0–10.5)
nRBC: 0 % (ref 0.0–0.2)

## 2022-10-20 LAB — TROPONIN I (HIGH SENSITIVITY)
Troponin I (High Sensitivity): 16113 ng/L (ref <18)
Troponin I (High Sensitivity): 23366 ng/L (ref <18)

## 2022-10-20 LAB — HEPARIN LEVEL (UNFRACTIONATED)
Heparin Unfractionated: 0.53 IU/mL (ref 0.30–0.70)
Heparin Unfractionated: 0.49 IU/mL (ref 0.30–0.70)

## 2022-10-20 LAB — MAGNESIUM: Magnesium: 2 mg/dL (ref 1.7–2.4)

## 2022-10-20 SURGERY — LEFT HEART CATH AND CORONARY ANGIOGRAPHY
Anesthesia: LOCAL

## 2022-10-20 MED ORDER — MORPHINE SULFATE (PF) 4 MG/ML IV SOLN: 4.0000 mg | Freq: Once | INTRAVENOUS | Status: AC

## 2022-10-20 MED ORDER — TICAGRELOR 90 MG PO TABS
ORAL_TABLET | ORAL | Status: AC
  Filled 2022-10-20: qty 1

## 2022-10-20 MED ORDER — INSULIN ASPART 100 UNIT/ML IJ SOLN
0.0000 [IU] | Freq: Three times a day (TID) | INTRAMUSCULAR | Status: DC
  Administered 2022-10-20: 3 [IU] via SUBCUTANEOUS
  Administered 2022-10-20: 4 [IU] via SUBCUTANEOUS
  Administered 2022-10-21: 3 [IU] via SUBCUTANEOUS
  Administered 2022-10-21: 7 [IU] via SUBCUTANEOUS
  Administered 2022-10-22 (×2): 4 [IU] via SUBCUTANEOUS

## 2022-10-20 MED ORDER — IOHEXOL 350 MG/ML SOLN
INTRAVENOUS | Status: DC | PRN
  Administered 2022-10-20: 175 mL

## 2022-10-20 MED ORDER — HEPARIN (PORCINE) IN NACL 1000-0.9 UT/500ML-% IV SOLN
INTRAVENOUS | Status: DC | PRN
  Administered 2022-10-20 (×2): 500 mL

## 2022-10-20 MED ORDER — NITROGLYCERIN 1 MG/10 ML FOR IR/CATH LAB
INTRA_ARTERIAL | Status: DC | PRN
  Administered 2022-10-20 (×2): 200 ug via INTRACORONARY

## 2022-10-20 MED ORDER — SODIUM CHLORIDE 0.9 % WEIGHT BASED INFUSION: 1.0000 mL/kg/h | INTRAVENOUS | Status: DC

## 2022-10-20 MED ORDER — SODIUM CHLORIDE 0.9 % IV SOLN: INTRAVENOUS | Status: AC

## 2022-10-20 MED ORDER — IRBESARTAN 75 MG PO TABS
37.5000 mg | ORAL_TABLET | Freq: Every day | ORAL | Status: DC
  Administered 2022-10-21: 37.5 mg via ORAL
  Filled 2022-10-20 (×2): qty 0.5

## 2022-10-20 MED ORDER — SODIUM CHLORIDE 0.9 % IV SOLN
12.5000 mg | Freq: Once | INTRAVENOUS | Status: DC
  Filled 2022-10-20: qty 0.5

## 2022-10-20 MED ORDER — MORPHINE SULFATE (PF) 4 MG/ML IV SOLN
4.0000 mg | Freq: Once | INTRAVENOUS | Status: AC
  Administered 2022-10-20: 4 mg via INTRAVENOUS
  Filled 2022-10-20: qty 1

## 2022-10-20 MED ORDER — ASPIRIN 81 MG PO CHEW: 81.0000 mg | CHEWABLE_TABLET | ORAL | Status: DC

## 2022-10-20 MED ORDER — SODIUM CHLORIDE 0.9% FLUSH: 3.0000 mL | INTRAVENOUS | Status: DC | PRN

## 2022-10-20 MED ORDER — HYDRALAZINE HCL 20 MG/ML IJ SOLN: 10.0000 mg | INTRAMUSCULAR | Status: AC | PRN

## 2022-10-20 MED ORDER — HEPARIN SODIUM (PORCINE) 1000 UNIT/ML IJ SOLN
INTRAMUSCULAR | Status: DC | PRN
  Administered 2022-10-20: 6000 [IU] via INTRAVENOUS
  Administered 2022-10-20: 4000 [IU] via INTRAVENOUS

## 2022-10-20 MED ORDER — VERAPAMIL HCL 2.5 MG/ML IV SOLN
INTRAVENOUS | Status: DC | PRN
  Administered 2022-10-20: 10 mL via INTRA_ARTERIAL

## 2022-10-20 MED ORDER — LABETALOL HCL 5 MG/ML IV SOLN
INTRAVENOUS | Status: DC | PRN
  Administered 2022-10-20: 10 mg via INTRAVENOUS

## 2022-10-20 MED ORDER — VERAPAMIL HCL 2.5 MG/ML IV SOLN
INTRAVENOUS | Status: AC
  Filled 2022-10-20: qty 2

## 2022-10-20 MED ORDER — SODIUM CHLORIDE 0.9 % IV SOLN: 250.0000 mL | INTRAVENOUS | Status: DC | PRN

## 2022-10-20 MED ORDER — TICAGRELOR 90 MG PO TABS
ORAL_TABLET | ORAL | Status: DC | PRN
  Administered 2022-10-20: 180 mg via ORAL

## 2022-10-20 MED ORDER — SODIUM CHLORIDE 0.9% FLUSH
3.0000 mL | Freq: Two times a day (BID) | INTRAVENOUS | Status: DC
  Administered 2022-10-21 – 2022-10-22 (×2): 3 mL via INTRAVENOUS

## 2022-10-20 MED ORDER — MORPHINE SULFATE (PF) 2 MG/ML IV SOLN: 2.0000 mg | Freq: Once | INTRAVENOUS | Status: DC

## 2022-10-20 MED ORDER — FENTANYL CITRATE (PF) 100 MCG/2ML IJ SOLN
INTRAMUSCULAR | Status: DC | PRN
  Administered 2022-10-20: 25 ug via INTRAVENOUS

## 2022-10-20 MED ORDER — NITROGLYCERIN IN D5W 200-5 MCG/ML-% IV SOLN
0.0000 ug/min | INTRAVENOUS | Status: DC
  Administered 2022-10-20: 5 ug/min via INTRAVENOUS
  Filled 2022-10-20: qty 250

## 2022-10-20 MED ORDER — LIDOCAINE HCL (PF) 1 % IJ SOLN
INTRAMUSCULAR | Status: DC | PRN
  Administered 2022-10-20: 2 mL via SUBCUTANEOUS

## 2022-10-20 MED ORDER — INSULIN ASPART 100 UNIT/ML IJ SOLN: 0.0000 [IU] | Freq: Three times a day (TID) | INTRAMUSCULAR | Status: DC

## 2022-10-20 MED ORDER — MIDAZOLAM HCL 2 MG/2ML IJ SOLN
INTRAMUSCULAR | Status: DC | PRN
  Administered 2022-10-20: 2 mg via INTRAVENOUS

## 2022-10-20 MED ORDER — MORPHINE SULFATE (PF) 4 MG/ML IV SOLN: 4.0000 mg | Freq: Once | INTRAVENOUS | Status: DC

## 2022-10-20 MED ORDER — SODIUM CHLORIDE 0.9% FLUSH
3.0000 mL | Freq: Two times a day (BID) | INTRAVENOUS | Status: DC
  Administered 2022-10-20 – 2022-10-22 (×3): 3 mL via INTRAVENOUS

## 2022-10-20 MED ORDER — ROSUVASTATIN CALCIUM 20 MG PO TABS
40.0000 mg | ORAL_TABLET | Freq: Every day | ORAL | Status: DC
  Administered 2022-10-21 – 2022-10-22 (×2): 40 mg via ORAL
  Filled 2022-10-20 (×2): qty 2

## 2022-10-20 MED ORDER — HEPARIN SODIUM (PORCINE) 1000 UNIT/ML IJ SOLN
INTRAMUSCULAR | Status: AC
  Filled 2022-10-20: qty 10

## 2022-10-20 MED ORDER — FENTANYL CITRATE (PF) 100 MCG/2ML IJ SOLN
INTRAMUSCULAR | Status: AC
  Filled 2022-10-20: qty 2

## 2022-10-20 MED ORDER — MIDAZOLAM HCL 2 MG/2ML IJ SOLN
INTRAMUSCULAR | Status: AC
  Filled 2022-10-20: qty 2

## 2022-10-20 MED ORDER — SODIUM CHLORIDE 0.9 % WEIGHT BASED INFUSION: 3.0000 mL/kg/h | INTRAVENOUS | Status: DC

## 2022-10-20 MED ORDER — LABETALOL HCL 5 MG/ML IV SOLN
INTRAVENOUS | Status: AC
  Filled 2022-10-20: qty 4

## 2022-10-20 MED ORDER — TICAGRELOR 90 MG PO TABS
90.0000 mg | ORAL_TABLET | Freq: Two times a day (BID) | ORAL | Status: DC
  Administered 2022-10-20 – 2022-10-22 (×4): 90 mg via ORAL
  Filled 2022-10-20 (×4): qty 1

## 2022-10-20 MED ORDER — LIDOCAINE HCL (PF) 1 % IJ SOLN
INTRAMUSCULAR | Status: AC
  Filled 2022-10-20: qty 30

## 2022-10-20 SURGICAL SUPPLY — 20 items
BALLN SAPPHIRE 1.25X10 (BALLOONS) ×1
BALLN SAPPHIRE 2.5X12 (BALLOONS) ×1
BALLOON SAPPHIRE 1.25X10 (BALLOONS) IMPLANT
BALLOON SAPPHIRE 2.5X12 (BALLOONS) IMPLANT
CATH OPTITORQUE TIG 4.0 5F (CATHETERS) IMPLANT
CATH VISTA GUIDE 6FR XBLAD3.5 (CATHETERS) IMPLANT
DEVICE RAD COMP TR BAND LRG (VASCULAR PRODUCTS) IMPLANT
ELECT DEFIB PAD ADLT CADENCE (PAD) IMPLANT
GLIDESHEATH SLEND SS 6F .021 (SHEATH) IMPLANT
GUIDEWIRE INQWIRE 1.5J.035X260 (WIRE) IMPLANT
INQWIRE 1.5J .035X260CM (WIRE) ×1
KIT ENCORE 26 ADVANTAGE (KITS) IMPLANT
KIT HEART LEFT (KITS) ×1 IMPLANT
PACK CARDIAC CATHETERIZATION (CUSTOM PROCEDURE TRAY) ×1 IMPLANT
STENT SYNERGY XD 3.0X16 (Permanent Stent) IMPLANT
SYNERGY XD 3.0X16 (Permanent Stent) ×1 IMPLANT
TRANSDUCER W/STOPCOCK (MISCELLANEOUS) ×1 IMPLANT
TUBING CIL FLEX 10 FLL-RA (TUBING) ×1 IMPLANT
WIRE ASAHI PROWATER 180CM (WIRE) IMPLANT
WIRE RUNTHROUGH IZANAI 014 180 (WIRE) IMPLANT

## 2022-10-20 NOTE — Progress Notes (Signed)
PROGRESS NOTE    Sean Schaefer  [id number] DOB: [birth year] DOA: 10/19/2022 PCP: [name], MD  56/M with Crohn's disease, 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, uncontrolled type 2 diabetes mellitus and hypertension admitted with chest pain, NSTEMI -Cards consulted, started on IV heparin   Subjective: -Okay overall, denies chest pain today  Assessment and Plan:   NSTEMI -At bedtime troponin has peaked at 23 K -Continue IV heparin infusion, aspirin, Coreg, statin -Check echo today -Cards following, plan for cath tomorrow  Hyperlipidemia Increasing home rosuvastatin to 20 mg from 10 mg (his "allergy is myalgia currently taking 10 mg)  Hypertensive urgency -Improved, continue Coreg, hydralazine as needed  AKI (acute kidney injury) ([place]) Creatinine is 1.59, increased from 1.02 to 1-year ago Suspect element of hypovolemic, of note patient has significant proteinuria, holding ACE inhibitor and NSAID -Creatinine improving  Hyponatremia -Resolved  Uncontrolled type 2 diabetes mellitus with hyperglycemia, glucose is 451->340s, no gap although some ketones in the urine Patient reports taking Humalog 10 units with meals and Tresiba 38 units at night -Improving, continue 30 units of glargine and sliding scale, n.p.o. after midnight, follow-up HbA1c   Crohn's disease ([place]) Inflammatory bowel disease Holding infliximab and azathioprine  DVT prophylaxis: IV heparin Code Status: Full code Family Communication: None present Disposition Plan:   Consultants: Cards   Procedures:   Antimicrobials:    Objective: Vitals:   10/19/22 2240 10/20/22 0000 10/20/22 0505 10/20/22 0815  BP: 132/84 121/70 117/71 132/69  Pulse: (!) 110 100 87 77  Resp: 18 18 18 18   Temp: 97.7 F (36.5 C) 97.9 F (36.6 C) 97.8 F (36.6 C) 97.8 F (36.6 C)  TempSrc: Oral Oral Oral Oral  SpO2: 99% 98% 99% 100%  Weight:   97.1 kg   Height:        Intake/Output Summary (Last 24  hours) at 10/20/2022 1057 Last data filed at 10/20/2022 0725 Gross per 24 hour  Intake 760.83 ml  Output --  Net 760.83 ml   Filed Weights   10/19/22 1902 10/20/22 0505  Weight: 97.1 kg 97.1 kg    Examination:  General exam: Appears calm and comfortable  Respiratory system: Clear to auscultation Cardiovascular system: S1 & S2 heard, RRR.  Abd: nondistended, soft and nontender.Normal bowel sounds heard. Central nervous system: Alert and oriented. No focal neurological deficits. Extremities: no edema Skin: No rashes Psychiatry:  Mood & affect appropriate.     Data Reviewed:   CBC: Recent Labs  Lab 10/19/22 1830 10/20/22 0050  WBC 8.3 7.9  NEUTROABS 6.7  --   HGB 13.1 13.9  HCT 38.8* 38.4*  MCV 96.3 93.4  PLT 161 [id number]   Basic Metabolic Panel: Recent Labs  Lab 10/19/22 1830 10/20/22 0049 10/20/22 0050  NA 131* 136  --   K 4.3 3.6  --   CL 96* 100  --   CO2 22 22  --   GLUCOSE 451* 198*  --   BUN 21* 17  --   CREATININE 1.59* 1.25*  --   CALCIUM 8.8* 9.0  --   MG  --   --  2.0   GFR: Estimated Creatinine Clearance: 78.9 mL/min (A) (by C-G formula based on SCr of 1.25 mg/dL (H)). Liver Function Tests: Recent Labs  Lab 10/19/22 1830 10/20/22 0049  AST 50* 163*  ALT 17 27  ALKPHOS 64 65  BILITOT 0.9 0.9  PROT 8.0 7.7  ALBUMIN 3.4* 3.5   Recent Labs  Lab 10/19/22 1830  LIPASE 44   No results for input(s): "AMMONIA" in the last 168 hours. Coagulation Profile: No results for input(s): "INR", "PROTIME" in the last 168 hours. Cardiac Enzymes: No results for input(s): "CKTOTAL", "CKMB", "CKMBINDEX", "TROPONINI" in the last 168 hours. BNP (last 3 results) No results for input(s): "PROBNP" in the last 8760 hours. HbA1C: No results for input(s): "HGBA1C" in the last 72 hours. CBG: Recent Labs  Lab 10/19/22 2157 10/20/22 0005 10/20/22 0523 10/20/22 0813  GLUCAP 342* 221* 98 130*   Lipid Profile: No results for input(s): "CHOL", "HDL", "LDLCALC",  "TRIG", "CHOLHDL", "LDLDIRECT" in the last 72 hours. Thyroid Function Tests: No results for input(s): "TSH", "T4TOTAL", "FREET4", "T3FREE", "THYROIDAB" in the last 72 hours. Anemia Panel: No results for input(s): "VITAMINB12", "FOLATE", "FERRITIN", "TIBC", "IRON", "RETICCTPCT" in the last 72 hours. Urine analysis:    Component Value Date/Time   COLORURINE YELLOW 10/19/2022 1955   APPEARANCEUR CLEAR 10/19/2022 1955   LABSPEC 1.026 10/19/2022 1955   PHURINE 5.0 10/19/2022 1955   GLUCOSEU >=500 (A) 10/19/2022 1955   HGBUR SMALL (A) 10/19/2022 1955   BILIRUBINUR NEGATIVE 10/19/2022 1955   KETONESUR 5 (A) 10/19/2022 1955   PROTEINUR 100 (A) 10/19/2022 1955   NITRITE NEGATIVE 10/19/2022 1955   LEUKOCYTESUR NEGATIVE 10/19/2022 1955   Sepsis Labs: @LABRCNTIP (procalcitonin:4,lacticidven:4)  )No results found for this or any previous visit (from the past 240 hour(s)).   Radiology Studies: DG Chest Portable 1 View  Result Date: 10/19/2022 CLINICAL DATA:  Chest pain EXAM: PORTABLE CHEST 1 VIEW COMPARISON:  12/02/2013 FINDINGS: The heart size and mediastinal contours are within normal limits. Both lungs are clear. The visualized skeletal structures are unremarkable. IMPRESSION: Normal study. Electronically Signed   By: Rolm Baptise M.D.   On: 10/19/2022 19:03     Scheduled Meds:  [START ON 10/21/2022] aspirin  81 mg Oral Pre-Cath   aspirin EC  81 mg Oral Daily   carvedilol  12.5 mg Oral BID WC   insulin aspart  0-20 Units Subcutaneous TID WC & HS   insulin glargine-yfgn  30 Units Subcutaneous Q2200   rosuvastatin  20 mg Oral Daily   sodium chloride flush  3 mL Intravenous Q12H   Continuous Infusions:  sodium chloride 75 mL/hr at 10/20/22 0725   sodium chloride     [START ON 10/21/2022] sodium chloride     Followed by   Derrill Memo ON 10/21/2022] sodium chloride     heparin 1,200 Units/hr (10/19/22 2046)     LOS: 1 day    Time spent: 77min    Domenic Polite, MD [facility]   10/20/2022, 10:57 AM

## 2022-10-20 NOTE — H&P (View-Only) (Signed)
Progress Note  Patient Name: Sean Schaefer Date of Encounter: 10/20/2022  Primary Cardiologist: None  Subjective   No acute events overnight. Patient is chest pain-free after applying the Nitropatch.  Inpatient Medications    Scheduled Meds:  aspirin EC  81 mg Oral Daily   carvedilol  12.5 mg Oral BID WC   insulin aspart  0-20 Units Subcutaneous TID WC & HS   insulin glargine-yfgn  30 Units Subcutaneous Q2200   rosuvastatin  20 mg Oral Daily   Continuous Infusions:  sodium chloride 75 mL/hr at 10/20/22 0725   heparin 1,200 Units/hr (10/19/22 2046)   PRN Meds: acetaminophen **OR** acetaminophen, HYDROmorphone (DILAUDID) injection, metoprolol tartrate, nitroGLYCERIN, ondansetron (ZOFRAN) IV, oxyCODONE   Vital Signs    Vitals:   10/19/22 2240 10/20/22 0000 10/20/22 0505 10/20/22 0815  BP: 132/84 121/70 117/71 132/69  Pulse: (!) 110 100 87 77  Resp: 18 18 18 18   Temp: 97.7 F (36.5 C) 97.9 F (36.6 C) 97.8 F (36.6 C) 97.8 F (36.6 C)  TempSrc: Oral Oral Oral Oral  SpO2: 99% 98% 99% 100%  Weight:   97.1 kg   Height:        Intake/Output Summary (Last 24 hours) at 10/20/2022 0932 Last data filed at 10/20/2022 0725 Gross per 24 hour  Intake 760.83 ml  Output --  Net 760.83 ml   Filed Weights   10/19/22 1902 10/20/22 0505  Weight: 97.1 kg 97.1 kg    Telemetry     Personally reviewed, normal sinus rhythm and rate controlled  ECG   EKG not performed today, EKG admission showed no ST changes  Physical Exam   GEN: No acute distress.   Neck: No JVD. Cardiac: RRR, no murmur, rub, or gallop.  Respiratory: Nonlabored. Clear to auscultation bilaterally. GI: Soft, nontender, bowel sounds present. MS: No edema; No deformity. Neuro:  Nonfocal. Psych: Alert and oriented x 3. Normal affect.  Labs    Chemistry Recent Labs  Lab 10/19/22 1830 10/20/22 0049  NA 131* 136  K 4.3 3.6  CL 96* 100  CO2 22 22  GLUCOSE 451* 198*  BUN 21* 17  CREATININE  1.59* 1.25*  CALCIUM 8.8* 9.0  PROT 8.0 7.7  ALBUMIN 3.4* 3.5  AST 50* 163*  ALT 17 27  ALKPHOS 64 65  BILITOT 0.9 0.9  GFRNONAA 51* >60  ANIONGAP 13 14     Hematology Recent Labs  Lab 10/19/22 1830 10/20/22 0050  WBC 8.3 7.9  RBC 4.03* 4.11*  HGB 13.1 13.9  HCT 38.8* 38.4*  MCV 96.3 93.4  MCH 32.5 33.8  MCHC 33.8 36.2*  RDW 12.8 12.9  PLT 161 161    Cardiac Enzymes Recent Labs  Lab 10/19/22 1830 10/19/22 2120 10/20/22 0050 10/20/22 0329  TROPONINIHS 2,264* 7,237* 16,113* 23,366*    BNPNo results for input(s): "BNP", "PROBNP" in the last 168 hours.   DDimerNo results for input(s): "DDIMER" in the last 168 hours.   Radiology    DG Chest Portable 1 View  Result Date: 10/19/2022 CLINICAL DATA:  Chest pain EXAM: PORTABLE CHEST 1 VIEW COMPARISON:  12/02/2013 FINDINGS: The heart size and mediastinal contours are within normal limits. Both lungs are clear. The visualized skeletal structures are unremarkable. IMPRESSION: Normal study. Electronically Signed   By: Rolm Baptise M.D.   On: 10/19/2022 19:03    Cardiac Studies   Echo pending  Assessment & Plan   Patient is a 57 year old M known to have Crohn's disease s/p  colectomy, on infusions and perianal fistula, HTN, DM 2 is currently admitted to the hospitalist team for the management of NSTEMI.  # NSTEMI -EKG showed normal sinus rhythm and no ST changes.Hs troponins 2k>>7k>>16>>23k. -Patient chest pain-free on nitro patch -Continue ACS protocol, aspirin and high intensity statin -Patient will benefit from invasive ischemia evaluation with LHC. Patient is posted for [facility] on 10/21/2022 unless he develops recurrent chest discomfort which is when we will perform LHC today. I spoke with interventional cardiologist on-call today, Dr Ellyn Hack who recommended [facility] tomorrow as the patient is chest pain free today. - Risks and benefits of cardiac catheterization have been discussed with the patient. These include bleeding,  infection, kidney damage, stroke, heart attack, death. The patient understands these risks and is willing to proceed. -Obtain A1c, lipid panel, lipoprotein a  # HTN, controlled: Continue carvedilol 12.5 mg twice daily. Will start ACE/ARB after LHC.  I have spent a total of 30 minutes with patient reviewing chart , telemetry, EKGs, labs and examining patient as well as establishing an assessment and plan that was discussed with the patient.  > 50% of time was spent in direct patient care.     Signed, Chalmers Guest, MD  10/20/2022, 9:32 AM

## 2022-10-20 NOTE — Progress Notes (Signed)
Progress Note  Patient Name: Sean Schaefer Date of Encounter: 10/20/2022  Primary Cardiologist: None  Subjective   No acute events overnight. Patient is chest pain-free after applying the Nitropatch.  Inpatient Medications    Scheduled Meds:  aspirin EC  81 mg Oral Daily   carvedilol  12.5 mg Oral BID WC   insulin aspart  0-20 Units Subcutaneous TID WC & HS   insulin glargine-yfgn  30 Units Subcutaneous Q2200   rosuvastatin  20 mg Oral Daily   Continuous Infusions:  sodium chloride 75 mL/hr at 10/20/22 0725   heparin 1,200 Units/hr (10/19/22 2046)   PRN Meds: acetaminophen **OR** acetaminophen, HYDROmorphone (DILAUDID) injection, metoprolol tartrate, nitroGLYCERIN, ondansetron (ZOFRAN) IV, oxyCODONE   Vital Signs    Vitals:   10/19/22 2240 10/20/22 0000 10/20/22 0505 10/20/22 0815  BP: 132/84 121/70 117/71 132/69  Pulse: (!) 110 100 87 77  Resp: 18 18 18 18   Temp: 97.7 F (36.5 C) 97.9 F (36.6 C) 97.8 F (36.6 C) 97.8 F (36.6 C)  TempSrc: Oral Oral Oral Oral  SpO2: 99% 98% 99% 100%  Weight:   97.1 kg   Height:        Intake/Output Summary (Last 24 hours) at 10/20/2022 0932 Last data filed at 10/20/2022 0725 Gross per 24 hour  Intake 760.83 ml  Output --  Net 760.83 ml   Filed Weights   10/19/22 1902 10/20/22 0505  Weight: 97.1 kg 97.1 kg    Telemetry     Personally reviewed, normal sinus rhythm and rate controlled  ECG   EKG not performed today, EKG admission showed no ST changes  Physical Exam   GEN: No acute distress.   Neck: No JVD. Cardiac: RRR, no murmur, rub, or gallop.  Respiratory: Nonlabored. Clear to auscultation bilaterally. GI: Soft, nontender, bowel sounds present. MS: No edema; No deformity. Neuro:  Nonfocal. Psych: Alert and oriented x 3. Normal affect.  Labs    Chemistry Recent Labs  Lab 10/19/22 1830 10/20/22 0049  NA 131* 136  K 4.3 3.6  CL 96* 100  CO2 22 22  GLUCOSE 451* 198*  BUN 21* 17  CREATININE  1.59* 1.25*  CALCIUM 8.8* 9.0  PROT 8.0 7.7  ALBUMIN 3.4* 3.5  AST 50* 163*  ALT 17 27  ALKPHOS 64 65  BILITOT 0.9 0.9  GFRNONAA 51* >60  ANIONGAP 13 14     Hematology Recent Labs  Lab 10/19/22 1830 10/20/22 0050  WBC 8.3 7.9  RBC 4.03* 4.11*  HGB 13.1 13.9  HCT 38.8* 38.4*  MCV 96.3 93.4  MCH 32.5 33.8  MCHC 33.8 36.2*  RDW 12.8 12.9  PLT 161 161    Cardiac Enzymes Recent Labs  Lab 10/19/22 1830 10/19/22 2120 10/20/22 0050 10/20/22 0329  TROPONINIHS 2,264* 7,237* 16,113* 23,366*    BNPNo results for input(s): "BNP", "PROBNP" in the last 168 hours.   DDimerNo results for input(s): "DDIMER" in the last 168 hours.   Radiology    DG Chest Portable 1 View  Result Date: 10/19/2022 CLINICAL DATA:  Chest pain EXAM: PORTABLE CHEST 1 VIEW COMPARISON:  12/02/2013 FINDINGS: The heart size and mediastinal contours are within normal limits. Both lungs are clear. The visualized skeletal structures are unremarkable. IMPRESSION: Normal study. Electronically Signed   By: Rolm Baptise M.D.   On: 10/19/2022 19:03    Cardiac Studies   Echo pending  Assessment & Plan   Patient is a 57 year old M known to have Crohn's disease s/p  colectomy, on infusions and perianal fistula, HTN, DM 2 is currently admitted to the hospitalist team for the management of NSTEMI.  # NSTEMI -EKG showed normal sinus rhythm and no ST changes.Hs troponins 2k>>7k>>16>>23k. -Patient chest pain-free on nitro patch -Continue ACS protocol, aspirin and high intensity statin -Patient will benefit from invasive ischemia evaluation with LHC. Patient is posted for [facility] on 10/21/2022 unless he develops recurrent chest discomfort which is when we will perform LHC today. I spoke with interventional cardiologist on-call today, Dr Ellyn Hack who recommended [facility] tomorrow as the patient is chest pain free today. - Risks and benefits of cardiac catheterization have been discussed with the patient. These include bleeding,  infection, kidney damage, stroke, heart attack, death. The patient understands these risks and is willing to proceed. -Obtain A1c, lipid panel, lipoprotein a  # HTN, controlled: Continue carvedilol 12.5 mg twice daily. Will start ACE/ARB after LHC.  I have spent a total of 30 minutes with patient reviewing chart , telemetry, EKGs, labs and examining patient as well as establishing an assessment and plan that was discussed with the patient.  > 50% of time was spent in direct patient care.     Signed, Chalmers Guest, MD  10/20/2022, 9:32 AM

## 2022-10-20 NOTE — Progress Notes (Signed)
ANTICOAGULATION CONSULT NOTE - Initial Consult  Pharmacy Consult for Heparin Indication: chest pain/ACS  Allergies  Allergen Reactions   Statins Other (See Comments)    Myalgias  Currently taking Crestor 10mg .    Patient Measurements: Height: 5\' 10"  (177.8 cm) Weight: 97.1 kg (214 lb 0.7 oz) IBW/kg (Calculated) : 73 Heparin Dosing Weight: 93 kg  Vital Signs: Temp: 97.8 F (36.6 C) (03/31 0815) Temp Source: Oral (03/31 0815) BP: 132/69 (03/31 0815) Pulse Rate: 77 (03/31 0815)  Labs: Recent Labs    10/19/22 1830 10/19/22 2120 10/20/22 0049 10/20/22 0050 10/20/22 0329 10/20/22 1140  HGB 13.1  --   --  13.9  --   --   HCT 38.8*  --   --  38.4*  --   --   PLT 161  --   --  161  --   --   HEPARINUNFRC  --   --   --   --  0.53 0.49  CREATININE 1.59*  --  1.25*  --   --   --   TROPONINIHS 2,264* 7,237*  --  16,113* 23,366*  --      Estimated Creatinine Clearance: 78.9 mL/min (A) (by C-G formula based on SCr of 1.25 mg/dL (H)).   Medical History: Past Medical History:  Diagnosis Date   Crohn's disease ([place])    Diabetes mellitus without complication ([place])    Hypertension    Ulcerative colitis ([place])     Medications:  Facility-Administered Medications Prior to Admission  Medication Dose Route Frequency Provider Last Rate Last Admin   Insulin Pen Needle (NOVOFINE) 10 each  1 packet Subcutaneous QHS Tereasa Coop, PA-C       Medications Prior to Admission  Medication Sig Dispense Refill Last Dose   insulin degludec (TRESIBA FLEXTOUCH) 100 UNIT/ML FlexTouch Pen Inject 38 units into the skin once a day 90 days (Patient taking differently: Inject 38 Units into the skin at bedtime.) 3500 mL 3 10/18/2022   rosuvastatin (CRESTOR) 10 MG tablet Take 1 tablet (10 mg total) by mouth daily. 90 tablet 3 10/19/2022    Scheduled:   [START ON 10/21/2022] aspirin  81 mg Oral Pre-Cath   aspirin EC  81 mg Oral Daily   carvedilol  12.5 mg Oral BID WC   insulin aspart  0-20 Units  Subcutaneous TID WC & HS   insulin glargine-yfgn  30 Units Subcutaneous Q2200   rosuvastatin  20 mg Oral Daily   sodium chloride flush  3 mL Intravenous Q12H   Infusions:   sodium chloride 75 mL/hr at 10/20/22 0725   sodium chloride     [START ON 10/21/2022] sodium chloride     Followed by   Derrill Memo ON 10/21/2022] sodium chloride     heparin 1,200 Units/hr (10/19/22 2046)   PRN: sodium chloride, acetaminophen **OR** acetaminophen, HYDROmorphone (DILAUDID) injection, metoprolol tartrate, nitroGLYCERIN, ondansetron (ZOFRAN) IV, oxyCODONE, sodium chloride flush  Assessment: 89 yom with a history of Crohn's disease, DM, HTN, UC. Patient is presenting with chest pain with N/V and diaphoresis. STEMI activation pre-hospital. Decision made on arrival to not rush to cath lab. Patient is not on anticoagulation prior to arrival. Heparin per pharmacy consult placed for chest pain/ACS.  Troponin 2,264>>23,366 since arrival. Plan for [facility] on Monday as long as patient remains free of chest pain today.   Heparin level today is therapeutic at 0.49, on 1200 units/hr. Hgb 13.9, plt 161--stable. No line issues or signs/symptoms of bleeding reported.  Goal of Therapy:  Heparin level 0.3-0.7 units/ml Monitor platelets by anticoagulation protocol: Yes   Plan:  Continue heparin gtt @ 1200 units/hr  Monitor heparin level, CBC, and s/sx of bleeding daily  LHC tentatively scheduled for Monday, 4/1    Billey Gosling, PharmD PGY1 Pharmacy Resident 3/31/202412:54 PM

## 2022-10-20 NOTE — Progress Notes (Signed)
Patient with chest pain beginning after ambulation to the bathroom. Nitro given times 1 with no decrease in BP or pain. MD notified. Orders for nitro again x1 and 4mg  of morphine. BP remains elevated and pain still 5 out of 10. MD or APP en route to bedside

## 2022-10-20 NOTE — Progress Notes (Signed)
   10/20/22 1434  Spiritual Encounters  Type of Visit Initial  Care provided to: Patient  Referral source Code page  Reason for visit Code (Code STEMI)  OnCall Visit Yes  Spiritual Framework  Presenting Themes Values and beliefs  Values/beliefs God as savior/maker/healer  Community/Connection Family  Patient Stress Factors Health changes  Family Stress Factors None identified  Interventions  Spiritual Care Interventions Made Established relationship of care and support;Compassionate presence;Reflective listening;Prayer  Intervention Outcomes  Outcomes Connection to spiritual care;Awareness of support   Chaplain Kenton Kingfisher responded to code STEMI. Patient was aware of the code and was on the phone with his brother who is a Marine scientist. Patient did not appear in emotional or spiritual distress. He stated he "know(s) who his maker is." Patient asked chaplain to pray. No family present.   Note Prepared by Abbott Pao, Chaplain Resident [phone]

## 2022-10-20 NOTE — Plan of Care (Signed)
  Problem: Education: Goal: Understanding of cardiac disease, CV risk reduction, and recovery process will improve Outcome: Progressing Goal: Individualized Educational Video(s) Outcome: Progressing   Problem: Activity: Goal: Ability to tolerate increased activity will improve Outcome: Progressing   Problem: Cardiac: Goal: Ability to achieve and maintain adequate cardiovascular perfusion will improve Outcome: Progressing   Problem: Health Behavior/Discharge Planning: Goal: Ability to safely manage health-related needs after discharge will improve Outcome: Progressing   Problem: Education: Goal: Knowledge of General Education information will improve Description: Including pain rating scale, medication(s)/side effects and non-pharmacologic comfort measures Outcome: Progressing   Problem: Health Behavior/Discharge Planning: Goal: Ability to manage health-related needs will improve Outcome: Progressing   Problem: Clinical Measurements: Goal: Ability to maintain clinical measurements within normal limits will improve Outcome: Progressing Goal: Will remain free from infection Outcome: Progressing Goal: Diagnostic test results will improve Outcome: Progressing Goal: Respiratory complications will improve Outcome: Progressing Goal: Cardiovascular complication will be avoided Outcome: Progressing   Problem: Activity: Goal: Risk for activity intolerance will decrease Outcome: Progressing   Problem: Nutrition: Goal: Adequate nutrition will be maintained Outcome: Progressing   Problem: Coping: Goal: Level of anxiety will decrease Outcome: Progressing   Problem: Elimination: Goal: Will not experience complications related to bowel motility Outcome: Progressing Goal: Will not experience complications related to urinary retention Outcome: Progressing   Problem: Pain Managment: Goal: General experience of comfort will improve Outcome: Progressing   Problem:  Safety: Goal: Ability to remain free from injury will improve Outcome: Progressing   Problem: Skin Integrity: Goal: Risk for impaired skin integrity will decrease Outcome: Progressing   Problem: Education: Goal: Understanding of CV disease, CV risk reduction, and recovery process will improve Outcome: Progressing Goal: Individualized Educational Video(s) Outcome: Progressing   Problem: Activity: Goal: Ability to return to baseline activity level will improve Outcome: Progressing   Problem: Cardiovascular: Goal: Ability to achieve and maintain adequate cardiovascular perfusion will improve Outcome: Progressing Goal: Vascular access site(s) Level 0-1 will be maintained Outcome: Progressing   

## 2022-10-20 NOTE — Progress Notes (Signed)
ANTICOAGULATION CONSULT NOTE - Follow Up Consult  Pharmacy Consult for heparin Indication: chest pain/ACS  Labs: Recent Labs    10/19/22 1830 10/19/22 2120 10/20/22 0049 10/20/22 0050 10/20/22 0329  HGB 13.1  --   --  13.9  --   HCT 38.8*  --   --  38.4*  --   PLT 161  --   --  161  --   HEPARINUNFRC  --   --   --   --  0.53  CREATININE 1.59*  --  1.25*  --   --   TROPONINIHS 2,264* 7,237*  --  16,113*  --     Assessment/Plan:  57yo male therapeutic on heparin with initial dosing for CP. Will continue infusion at current rate of 1200 units/hr and confirm stable with additional level.   Wynona Neat, PharmD, BCPS  10/20/2022,4:52 AM

## 2022-10-20 NOTE — Interval H&P Note (Signed)
History and Physical Interval Note:  10/20/2022 2:34 PM  Interval change from previous note: S: Patient states she started having mid-sternum chest pain about 2pm, after he was up and moving around. Pain was resolved overnight and this morning, recurred now. Pain has not improved despite Nitro paste and morphine injection. Pain is 5/10. He feels nausea and is actively vomiting.    O: Uncomfortable, actively vomiting bile color contents. Alert and oriented x3. No cognitive deficit. Cardiac S1S2, tachycardiac 110s, no murmur. Lungs clear bilaterally. Abdomen soft. BLE no edema. BP168/90   A/P:   NSTEMI - STAT EKG done at bedside, new TWI of lateral [place] [address] at 0330 today - Reviewed case with STEMI team Dr Ellyn Hack, agreed patient requires urgent LHC, will call in STEMI team - will start IV Nitro gtt, continue heparin gtt - already on ASA 81mg , coreg 12.5mg  BID, and crestor 20mg   - NPO now, continue IVF hydration, cath to follow  - Appreciate nursing team  [name], NP   Sean Schaefer  has presented today for surgery, with the diagnosis of *URGENT NON-STEMI.  The various methods of treatment have been discussed with the patient and family. After consideration of risks, benefits and other options for treatment, the patient has consented to   Sean Schaefer   as a surgical intervention.  The patient's history has been reviewed, patient examined, no change in status, stable for surgery.  I have reviewed the patient's chart and labs.  Questions were answered to the patient's satisfaction.    Cath Lab Visit (complete for each Cath Lab visit)  Clinical Evaluation Leading to the Procedure:   ACS: Yes.    Non-ACS:    Anginal Classification: CCS IV  Anti-ischemic medical therapy: Maximal Therapy (2 or more classes of medications)  Non-Invasive Test Results: No non-invasive testing  performed  Prior CABG: No previous CABG    Sean Schaefer

## 2022-10-20 NOTE — Progress Notes (Addendum)
    S: Patient states she started having mid-sternum chest pain about 2pm, after he was up and moving around. Pain was resolved overnight and this morning, recurred now. Pain has not improved despite Nitro paste and morphine injection. Pain is 5/10. He feels nausea and is actively vomiting.   O: Uncomfortable, actively vomiting bile color contents. Alert and oriented x3. No cognitive deficit. Cardiac S1S2, tachycardiac 110s, no murmur. Lungs clear bilaterally. Abdomen soft. BLE no edema. BP168/90  A/P:  NSTEMI - STAT EKG done at bedside, new TWI of lateral leads - [place] [address] at 0330 today - Reviewed case with STEMI team Dr Ellyn Hack, agreed patient requires urgent LHC, will call in STEMI team - will start IV Nitro gtt, continue heparin gtt - already on ASA 81mg , coreg 12.5mg  BID, and crestor 20mg   - NPO now, continue IVF hydration, cath to follow  - Appreciate nursing team

## 2022-10-21 ENCOUNTER — Encounter (HOSPITAL_COMMUNITY): Payer: Self-pay | Admitting: Cardiology

## 2022-10-21 ENCOUNTER — Other Ambulatory Visit (HOSPITAL_COMMUNITY): Payer: Self-pay

## 2022-10-21 ENCOUNTER — Encounter (HOSPITAL_COMMUNITY)
Admission: EM | Disposition: A | Discharge status: Home / Self Care | Payer: Self-pay | Source: Home / Self Care | Attending: Internal Medicine

## 2022-10-21 ENCOUNTER — Encounter: Payer: Self-pay | Admitting: Gastroenterology

## 2022-10-21 ENCOUNTER — Inpatient Hospital Stay (HOSPITAL_COMMUNITY): Payer: 59

## 2022-10-21 DIAGNOSIS — E1165 Type 2 diabetes mellitus with hyperglycemia: Secondary | ICD-10-CM

## 2022-10-21 DIAGNOSIS — E782 Mixed hyperlipidemia: Secondary | ICD-10-CM

## 2022-10-21 DIAGNOSIS — Z794 Long term (current) use of insulin: Secondary | ICD-10-CM

## 2022-10-21 DIAGNOSIS — I160 Hypertensive urgency: Secondary | ICD-10-CM

## 2022-10-21 DIAGNOSIS — I214 Non-ST elevation (NSTEMI) myocardial infarction: Secondary | ICD-10-CM

## 2022-10-21 DIAGNOSIS — Z955 Presence of coronary angioplasty implant and graft: Secondary | ICD-10-CM

## 2022-10-21 DIAGNOSIS — I25110 Atherosclerotic heart disease of native coronary artery with unstable angina pectoris: Secondary | ICD-10-CM | POA: Diagnosis not present

## 2022-10-21 HISTORY — DX: Presence of coronary angioplasty implant and graft: Z95.5

## 2022-10-21 LAB — BASIC METABOLIC PANEL
Anion gap: 10 (ref 5–15)
BUN: 9 mg/dL (ref 6–20)
CO2: 25 mmol/L (ref 22–32)
Calcium: 8.1 mg/dL — ABNORMAL LOW (ref 8.9–10.3)
Chloride: 102 mmol/L (ref 98–111)
Creatinine, Ser: 0.93 mg/dL (ref 0.61–1.24)
GFR, Estimated: >60 mL/min (ref >60)
Glucose, Bld: 150 mg/dL — ABNORMAL HIGH (ref 70–99)
Potassium: 3.2 mmol/L — ABNORMAL LOW (ref 3.5–5.1)
Sodium: 137 mmol/L (ref 135–145)

## 2022-10-21 LAB — ECHOCARDIOGRAM COMPLETE
AR max vel: 3.68 cm2
AV Area VTI: 3.48 cm2
AV Area mean vel: 3.54 cm2
AV Mean grad: 3 mmHg
AV Peak grad: 6 mmHg
Ao pk vel: 1.22 m/s
Area-P 1/2: 4.19 cm2
Height: 70 in
S' Lateral: 2.1 cm
Weight: 3424.71 oz

## 2022-10-21 LAB — POCT ACTIVATED CLOTTING TIME
Activated Clotting Time: 174 seconds
Activated Clotting Time: 239 seconds
Activated Clotting Time: 320 seconds

## 2022-10-21 LAB — GLUCOSE, CAPILLARY
Glucose-Capillary: 86 mg/dL (ref 70–99)
Glucose-Capillary: 148 mg/dL — ABNORMAL HIGH (ref 70–99)
Glucose-Capillary: 123 mg/dL — ABNORMAL HIGH (ref 70–99)
Glucose-Capillary: 229 mg/dL — ABNORMAL HIGH (ref 70–99)

## 2022-10-21 LAB — LIPID PANEL
Cholesterol: 132 mg/dL (ref 0–200)
HDL: 50 mg/dL (ref >40)
LDL Cholesterol: 62 mg/dL (ref 0–99)
Total CHOL/HDL Ratio: 2.6 RATIO
Triglycerides: 99 mg/dL (ref <150)
VLDL: 20 mg/dL (ref 0–40)

## 2022-10-21 LAB — CBC
HCT: 31.1 % — ABNORMAL LOW (ref 39.0–52.0)
Hemoglobin: 11.1 g/dL — ABNORMAL LOW (ref 13.0–17.0)
MCH: 34 pg (ref 26.0–34.0)
MCHC: 35.7 g/dL (ref 30.0–36.0)
MCV: 95.4 fL (ref 80.0–100.0)
Platelets: 128 10*3/uL — ABNORMAL LOW (ref 150–400)
RBC: 3.26 MIL/uL — ABNORMAL LOW (ref 4.22–5.81)
RDW: 13.1 % (ref 11.5–15.5)
WBC: 7.5 10*3/uL (ref 4.0–10.5)
nRBC: 0 % (ref 0.0–0.2)

## 2022-10-21 SURGERY — LEFT HEART CATH AND CORONARY ANGIOGRAPHY
Anesthesia: LOCAL

## 2022-10-21 MED ORDER — PERFLUTREN LIPID MICROSPHERE
1.0000 mL | INTRAVENOUS | Status: AC | PRN
  Administered 2022-10-21: 2 mL via INTRAVENOUS

## 2022-10-21 MED ORDER — POTASSIUM CHLORIDE CRYS ER 20 MEQ PO TBCR
40.0000 meq | EXTENDED_RELEASE_TABLET | Freq: Once | ORAL | Status: AC
  Administered 2022-10-21: 40 meq via ORAL
  Filled 2022-10-21: qty 2

## 2022-10-21 NOTE — Progress Notes (Signed)
  Transition of Care (TOC) Screening Note   Patient Details  Name: Sean Schaefer Date of Birth: 02-01-1966   Transition of Care [facility]) CM/SW Contact:    Cyndi Bender, RN Phone Number: 10/21/2022, 8:55 AM    Transition of Care Department [facility]) has reviewed patient and no TOC needs have been identified at this time. We will continue to monitor patient advancement through interdisciplinary progression rounds. If new patient transition needs arise, please place a TOC consult.

## 2022-10-21 NOTE — Progress Notes (Signed)
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, uncontrolled type 2 diabetes mellitus and hypertension admitted with chest pain, NSTEMI -Cards consulted, started on IV heparin -Worsening angina 3/31 afternoon, taken to the Cath Lab, noted to have occlusion of proximal circumflex, under went PTCA of proximal circumflex and PCI of proximal LAD with DES   Subjective: -Feels better today, no further chest pain or dyspnea  Assessment and Plan:   NSTEMI -At bedtime troponin has peaked at 23 K -Treated with IV heparin infusion, aspirin, Coreg, statin -CAD with multivessel disease, proximal circumflex lesion treated with PTCA, and underwent PCI/stenting of proximal LAD -Follow-up echo today -Continue aspirin, Coreg, Brilinta, Crestor  Hyperlipidemia -Crestor, dose increased  Hypertensive urgency -Improved, continue Coreg, hydralazine as needed  AKI (acute kidney injury) ([place]) Creatinine is 1.59, increased from 1.02 to 1-year ago Suspect element of hypovolemic, of note patient has significant proteinuria, holding ACE inhibitor and NSAID -Creatinine improving  Hyponatremia -Resolved  Hypokalemia -Repleted  Uncontrolled type 2 diabetes mellitus with hyperglycemia, glucose is 451->340s, no gap although some ketones in the urine Patient reports taking Humalog 10 units with meals and Tresiba 38 units at night -, Continue current regimen of glargine, HbA1c is pending  Crohn's disease ([place]) Inflammatory bowel disease Holding infliximab and azathioprine  DVT prophylaxis: IV heparin Code Status: Full code Family Communication: None present Disposition Plan:   Consultants: Cards   Procedures:  Left heart cath POST-CATH DIAGNOSES Three-Vessel Disease: Culprit Lesion: 99% proximal LCx into 100% OM1, TIMI 0 flow->  Successful Balloon PTCA restoring  TIMI-3 flow reducing to 30% stenosis. Focal ostial-proximal LAD 85% stenosis (TIMI-3 flow) followed by sequential 60% lesions in the mid and still vessel as well as 30% a goal with diffuse disease, 1 diminutive D1 1 followed by 2 small caliber D2 and D3 branches mild disease. ->  Successful DES PCI of ostial to proximal LAD with Synergy DES 3.0 mm x 16 mm deployed to 3.3 mm -> reduced to 0% (TIMI 3 flow pre & post) Large dominant RCA with proximal 25% and distal 20% lesions. There is a small posterolateral branch in the anterior descending artery actually bifurcates into get again another posterolateral branch that cover the large portion of the circumflex territory. This vessel has still percent ostial disease.  Antimicrobials:    Objective: Vitals:   10/21/22 0240 10/21/22 0358 10/21/22 0731 10/21/22 1107  BP: 131/75 130/74 (!) 141/79 (!) 140/95  Pulse: 89 86 85 86  Resp: 19 13 13 14   Temp: 98 F (36.7 C) 98.4 F (36.9 C) 98.2 F (36.8 C) 98.3 F (36.8 C)  TempSrc: Oral Oral Oral Oral  SpO2: 98% 96% 97% 97%  Weight:      Height:        Intake/Output Summary (Last 24 hours) at 10/21/2022 1135 Last data filed at 10/21/2022 0732 Gross per 24 hour  Intake 1641.45 ml  Output --  Net [id number] ml   Filed Weights   10/19/22 1902 10/20/22 0505  Weight: 97.1 kg 97.1 kg    Examination:  General exam: Appears calm and comfortable  Respiratory system: Clear to auscultation Cardiovascular system: S1 & S2 heard, RRR.  Abd: nondistended, soft and nontender.Normal bowel sounds heard. Central nervous system: Alert and oriented. No focal neurological deficits. Extremities: no edema Skin: No rashes Psychiatry:  Mood & affect appropriate.     Data Reviewed:  CBC: Recent Labs  Lab 10/19/22 1830 10/20/22 0050 10/21/22 0023  WBC 8.3 7.9 7.5  NEUTROABS 6.7  --   --   HGB 13.1 13.9 11.1*  HCT 38.8* 38.4* 31.1*  MCV 96.3 93.4 95.4  PLT 161 161 0000000*   Basic Metabolic Panel: Recent  Labs  Lab 10/19/22 1830 10/20/22 0049 10/20/22 0050 10/21/22 0023  NA 131* 136  --  137  K 4.3 3.6  --  3.2*  CL 96* 100  --  102  CO2 22 22  --  25  GLUCOSE 451* 198*  --  150*  BUN 21* 17  --  9  CREATININE 1.59* 1.25*  --  0.93  CALCIUM 8.8* 9.0  --  8.1*  MG  --   --  2.0  --    GFR: Estimated Creatinine Clearance: 106.1 mL/min (by C-G formula based on SCr of 0.93 mg/dL). Liver Function Tests: Recent Labs  Lab 10/19/22 1830 10/20/22 0049  AST 50* 163*  ALT 17 27  ALKPHOS 64 65  BILITOT 0.9 0.9  PROT 8.0 7.7  ALBUMIN 3.4* 3.5   Recent Labs  Lab 10/19/22 1830  LIPASE 44   No results for input(s): "AMMONIA" in the last 168 hours. Coagulation Profile: No results for input(s): "INR", "PROTIME" in the last 168 hours. Cardiac Enzymes: No results for input(s): "CKTOTAL", "CKMB", "CKMBINDEX", "TROPONINI" in the last 168 hours. BNP (last 3 results) No results for input(s): "PROBNP" in the last 8760 hours. HbA1C: No results for input(s): "HGBA1C" in the last 72 hours. CBG: Recent Labs  Lab 10/20/22 0813 10/20/22 1113 10/20/22 2116 10/21/22 0622 10/21/22 1110  GLUCAP 130* 128* 167* 86 148*   Lipid Profile: Recent Labs    10/21/22 0023  CHOL 132  HDL 50  LDLCALC 62  TRIG 99  CHOLHDL 2.6   Thyroid Function Tests: No results for input(s): "TSH", "T4TOTAL", "FREET4", "T3FREE", "THYROIDAB" in the last 72 hours. Anemia Panel: No results for input(s): "VITAMINB12", "FOLATE", "FERRITIN", "TIBC", "IRON", "RETICCTPCT" in the last 72 hours. Urine analysis:    Component Value Date/Time   COLORURINE YELLOW 10/19/2022 1955   APPEARANCEUR CLEAR 10/19/2022 1955   LABSPEC 1.026 10/19/2022 1955   PHURINE 5.0 10/19/2022 1955   GLUCOSEU >=500 (A) 10/19/2022 1955   HGBUR SMALL (A) 10/19/2022 [place] NEGATIVE 10/19/2022 1955   KETONESUR 5 (A) 10/19/2022 1955   PROTEINUR 100 (A) 10/19/2022 1955   NITRITE NEGATIVE 10/19/2022 1955   LEUKOCYTESUR NEGATIVE  10/19/2022 1955   Sepsis Labs: @LABRCNTIP (procalcitonin:4,lacticidven:4)  )No results found for this or any previous visit (from the past 240 hour(s)).   Radiology Studies: CARDIAC CATHETERIZATION  Result Date: 10/20/2022   CULPRIT Prox-Mid Cx lesion is 99% stenosed-> 1st Mrg lesion is 100% stenosed, TIMI 0 flow. -(1 lesion in segment)   Post intervention, there is a 30% residual stenosis throughout the entire segment restoring TIMI-3 flow.->   --------------------------------------------------------------------   Ost LAD to Prox LAD lesion is 85% stenosed.   A drug-eluting stent was successfully placed using a Synergy XD 3.0 x 16-deployed to 3.3 mm. Post intervention, there is a 0% residual stenosis.   Mid LAD to Dist LAD lesion is 60% stenosed. Dist LAD-1 lesion is 60% stenosed. Dist LAD-2 lesion is 30% stenosed.   ---------------------------------------------------------------------   Prox RCA lesion is 25% stenosed.  Dist RCA lesion is 20% stenosed.  2nd PL from rPDA is 60% stenosed.   ---------------------------------------------------------------------   The left ventricular systolic function is normal.  The left ventricular ejection fraction is 55-65% by visual estimate.  LV end diastolic pressure is normal.   There is no aortic valve stenosis. ================================================ POST-CATH DIAGNOSES Three-Vessel Disease: Culprit Lesion: 99% proximal LCx into 100% OM1, TIMI 0 flow-> Successful Balloon PTCA restoring TIMI-3 flow reducing to 30% stenosis. Focal ostial-proximal LAD 85% stenosis (TIMI-3 flow) followed by sequential 60% lesions in the mid and still vessel as well as 30% a goal with diffuse disease, 1 diminutive D1 1 followed by 2 small caliber D2 and D3 branches mild disease. -> Successful DES PCI of ostial to proximal LAD with Synergy DES 3.0 mm x 16 mm deployed to 3.3 mm -> reduced to 0% (TIMI 3 flow pre & post) Large dominant RCA with proximal 25% and distal 20% lesions.  There is a small posterolateral branch in the anterior descending artery actually bifurcates into get again another posterolateral branch that cover the large portion of the circumflex territory. This vessel has still percent ostial disease.  Relatively normal LVEF with no obvious wall motion abnormality. Normal EDP. RECOMMENDATIONS The patient be transferred to [facility] for post-cath care with TR band removal Continue IV nitroglycerin as necessary for blood pressure control.  Use PRN's overnight and initiate irbesartan 37.5 mg along with carvedilol 12.5 mg twice daily Follow-up 2D echo Anticipate that he should be stable for discharge tomorrow Need to monitor for GI bleed with Crohn's, if necessary may need to discontinue aspirin after 1 month and continue Brilinta monotherapy. Increased statin dose from 20 mg rosuvastatin 40 mg.  Seems to be tolerating.  If not would need PCSK9 inhibitor versus inclisiran. Diabetic control per primary team of consider SGLT2 inhibitor +/- GLP-1 agonist. Glenetta Hew, MD   DG Chest Portable 1 View  Result Date: 10/19/2022 CLINICAL DATA:  Chest pain EXAM: PORTABLE CHEST 1 VIEW COMPARISON:  12/02/2013 FINDINGS: The heart size and mediastinal contours are within normal limits. Both lungs are clear. The visualized skeletal structures are unremarkable. IMPRESSION: Normal study. Electronically Signed   By: Rolm Baptise M.D.   On: 10/19/2022 19:03     Scheduled Meds:  aspirin EC  81 mg Oral Daily   carvedilol  12.5 mg Oral BID WC   insulin aspart  0-20 Units Subcutaneous TID WC & HS   insulin glargine-yfgn  30 Units Subcutaneous Q2200   irbesartan  37.5 mg Oral Daily    morphine injection  2 mg Intravenous Once   rosuvastatin  40 mg Oral Daily   sodium chloride flush  3 mL Intravenous Q12H   sodium chloride flush  3 mL Intravenous Q12H   ticagrelor  90 mg Oral BID   Continuous Infusions:  sodium chloride 75 mL/hr at 10/20/22 1429   sodium chloride      nitroGLYCERIN 5 mcg/min (10/21/22 0503)   promethazine (PHENERGAN) injection (IM or IVPB)       LOS: 2 days    Time spent: 87min    Domenic Polite, MD [facility]   10/21/2022, 11:35 AM

## 2022-10-21 NOTE — Progress Notes (Signed)
CARDIAC REHAB PHASE I   PRE:  Rate/Rhythm: 90 SR    BP: sitting 149/85    SpO2: 97 RA  MODE:  Ambulation: 400 ft   POST:  Rate/Rhythm: 108 ST    BP: sitting 136/84     SpO2: 98 RA  Tolerated well, no c/o. Discussed with pt MI, stent/balloon, restrictions, Brilinta importance, diet (esp no sugared drinks with DM), exercise, NTG and CRPII. Pt receptive. He voices that he watches his diet and is active/exercising. Will refer to [place]. He voices that he had an adverse reaction to NTG.  [id number]   Yves Dill BS, ACSM-CEP 10/21/2022 10:30 AM

## 2022-10-21 NOTE — Progress Notes (Addendum)
Rounding Note    Patient Name: LAZARIUS MCKENNEY Date of Encounter: 10/21/2022  [place] Cardiologist: None   Subjective   Feeling well this morning. Sitting up in chair bedside. Has been up ambulating. Did well with this. No complaints.  Mentioned that he feels great.  Feels like he "could run a race".  Inpatient Medications    Scheduled Meds:  aspirin EC  81 mg Oral Daily   carvedilol  12.5 mg Oral BID WC   insulin aspart  0-20 Units Subcutaneous TID WC & HS   insulin glargine-yfgn  30 Units Subcutaneous Q2200   irbesartan  37.5 mg Oral Daily    morphine injection  2 mg Intravenous Once   rosuvastatin  40 mg Oral Daily   sodium chloride flush  3 mL Intravenous Q12H   sodium chloride flush  3 mL Intravenous Q12H   ticagrelor  90 mg Oral BID   Continuous Infusions:  sodium chloride 75 mL/hr at 10/20/22 1429   sodium chloride     nitroGLYCERIN 5 mcg/min (10/21/22 0503)   promethazine (PHENERGAN) injection (IM or IVPB)     PRN Meds: sodium chloride, acetaminophen **OR** acetaminophen, HYDROmorphone (DILAUDID) injection, metoprolol tartrate, ondansetron (ZOFRAN) IV, oxyCODONE, sodium chloride flush   Vital Signs    Vitals:   10/21/22 0240 10/21/22 0358 10/21/22 0731 10/21/22 1107  BP: 131/75 130/74 (!) 141/79 (!) 140/95  Pulse: 89 86 85 86  Resp: 19 13 13 14   Temp: 98 F (36.7 C) 98.4 F (36.9 C) 98.2 F (36.8 C) 98.3 F (36.8 C)  TempSrc: Oral Oral Oral Oral  SpO2: 98% 96% 97% 97%  Weight:      Height:        Intake/Output Summary (Last 24 hours) at 10/21/2022 1206 Last data filed at 10/21/2022 0732 Gross per 24 hour  Intake 1641.45 ml  Output --  Net [id number] ml      10/20/2022    5:05 AM 10/19/2022    7:02 PM 10/10/2022    8:33 AM  Last 3 Weights  Weight (lbs) 214 lb 0.7 oz 214 lb 212 lb 6.4 oz  Weight (kg) 97.09 kg 97.07 kg 96.344 kg      Telemetry    Normal sinus rhythm - Personally Reviewed  ECG    NSR, improved TWI - Personally  Reviewed  Physical Exam   GEN: No acute distress.   Neck: No JVD Cardiac: RRR, no murmurs, rubs, or gallops.  Respiratory: Clear to auscultation bilaterally. Breathing comfortably on RA GI: Soft, nontender, non-distended  MS: No edema; No deformity. Neuro:  Nonfocal  Psych: Normal affect, alert and oriented   Labs    High Sensitivity Troponin:   Recent Labs  Lab 10/19/22 1830 10/19/22 2120 10/20/22 0050 10/20/22 0329  TROPONINIHS 2,264* 7,237* 16,113* 23,366*     Chemistry Recent Labs  Lab 10/19/22 1830 10/20/22 0049 10/20/22 0050 10/21/22 0023  NA 131* 136  --  137  K 4.3 3.6  --  3.2*  CL 96* 100  --  102  CO2 22 22  --  25  GLUCOSE 451* 198*  --  150*  BUN 21* 17  --  9  CREATININE 1.59* 1.25*  --  0.93  CALCIUM 8.8* 9.0  --  8.1*  MG  --   --  2.0  --   PROT 8.0 7.7  --   --   ALBUMIN 3.4* 3.5  --   --   AST 50* 163*  --   --  ALT 17 27  --   --   ALKPHOS 64 65  --   --   BILITOT 0.9 0.9  --   --   GFRNONAA 51* >60  --  >60  ANIONGAP 13 14  --  10    Lipids  Recent Labs  Lab 10/21/22 0023  CHOL 132  TRIG 99  HDL 50  LDLCALC 62  CHOLHDL 2.6    Hematology Recent Labs  Lab 10/19/22 1830 10/20/22 0050 10/21/22 0023  WBC 8.3 7.9 7.5  RBC 4.03* 4.11* 3.26*  HGB 13.1 13.9 11.1*  HCT 38.8* 38.4* 31.1*  MCV 96.3 93.4 95.4  MCH 32.5 33.8 34.0  MCHC 33.8 36.2* 35.7  RDW 12.8 12.9 13.1  PLT 161 161 128*   Thyroid No results for input(s): "TSH", "FREET4" in the last 168 hours.  BNPNo results for input(s): "BNP", "PROBNP" in the last 168 hours.  DDimer No results for input(s): "DDIMER" in the last 168 hours.   Radiology    CARDIAC CATHETERIZATION  Result Date: 10/20/2022   CULPRIT Prox-Mid Cx lesion is 99% stenosed-> 1st Mrg lesion is 100% stenosed, TIMI 0 flow. -(1 lesion in segment)   Post intervention, there is a 30% residual stenosis throughout the entire segment restoring TIMI-3 flow.->    --------------------------------------------------------------------   Ost LAD to Prox LAD lesion is 85% stenosed.   A drug-eluting stent was successfully placed using a Synergy XD 3.0 x 16-deployed to 3.3 mm. Post intervention, there is a 0% residual stenosis.   Mid LAD to Dist LAD lesion is 60% stenosed. Dist LAD-1 lesion is 60% stenosed. Dist LAD-2 lesion is 30% stenosed.   ---------------------------------------------------------------------   Prox RCA lesion is 25% stenosed.  Dist RCA lesion is 20% stenosed.  2nd PL from rPDA is 60% stenosed.   ---------------------------------------------------------------------   The left ventricular systolic function is normal.  The left ventricular ejection fraction is 55-65% by visual estimate.  LV end diastolic pressure is normal.   There is no aortic valve stenosis. ================================================ POST-CATH DIAGNOSES Three-Vessel Disease: Culprit Lesion: 99% proximal LCx into 100% OM1, TIMI 0 flow-> Successful Balloon PTCA restoring TIMI-3 flow reducing to 30% stenosis. Focal ostial-proximal LAD 85% stenosis (TIMI-3 flow) followed by sequential 60% lesions in the mid and still vessel as well as 30% a goal with diffuse disease, 1 diminutive D1 1 followed by 2 small caliber D2 and D3 branches mild disease. -> Successful DES PCI of ostial to proximal LAD with Synergy DES 3.0 mm x 16 mm deployed to 3.3 mm -> reduced to 0% (TIMI 3 flow pre & post) Large dominant RCA with proximal 25% and distal 20% lesions. There is a small posterolateral branch in the anterior descending artery actually bifurcates into get again another posterolateral branch that cover the large portion of the circumflex territory. This vessel has still percent ostial disease.  Relatively normal LVEF with no obvious wall motion abnormality. Normal EDP. RECOMMENDATIONS The patient be transferred to [facility] Unit for post-cath care with TR band removal Continue IV nitroglycerin as  necessary for blood pressure control.  Use PRN's overnight and initiate irbesartan 37.5 mg along with carvedilol 12.5 mg twice daily Follow-up 2D echo Anticipate that he should be stable for discharge tomorrow Need to monitor for GI bleed with Crohn's, if necessary may need to discontinue aspirin after 1 month and continue Brilinta monotherapy. Increased statin dose from 20 mg rosuvastatin 40 mg.  Seems to be tolerating.  If not would need PCSK9  inhibitor versus inclisiran. Diabetic control per primary team of consider SGLT2 inhibitor +/- GLP-1 agonist. Glenetta Hew, MD   DG Chest Portable 1 View  Result Date: 10/19/2022 CLINICAL DATA:  Chest pain EXAM: PORTABLE CHEST 1 VIEW COMPARISON:  12/02/2013 FINDINGS: The heart size and mediastinal contours are within normal limits. Both lungs are clear. The visualized skeletal structures are unremarkable. IMPRESSION: Normal study. Electronically Signed   By: Rolm Baptise M.D.   On: 10/19/2022 19:03    Cardiac Studies   10/21/22 ECHO pending  Cardiac Cath-PCI 10/20/2022: 99%-100% LCx-OM1 - (small caliber ~ <2 mm) -> PTCA only; proximal LAD 85%-Synergy XD 3.0 x 16 mm - 3.3 mm. Diagnostic Dominance: Right     Intervention   Patient Profile     57 y.o. male with hx of DM, HTN, crohn's disease, and HLD who presented for was evaluated for chest pain at the request of hospital medicine service.   Assessment & Plan    # NSTEMI Troponin peaked at 23K.  S/p cath yesterday with PTCA to LCx-OM and DES proximal LAD. Currently on aspirin and brilinta.  Patient doing well post cath. Has ambulated without recurrence of pain. He is HDS. Telemetry NSR. EKG showing improvement in TWI.  - We will continue DAPT ASA and Brilinta 90 - continue Coreg -=> added irbesartan.  Plan to titrate up to 75 mg tomorrow - Continue Crestor - ECHO has been performed, will follow up these results.  - likely discharge tomorrow  # HTN Mild elevation above target.  On Coreg 12.5 BID.  Irbesartan 37.5mg  started today. - continue Coreg 12.5 BID - Irbesartan 37.5 -.  Plan to increase ARB tomorrow if renal function remains stable.   # HLD - Crestor 40mg   # DM - F/u a1c - Consider initiation of SGLT2i or GLP in outpatient setting. ->  Would defer to PCP given his insulin-dependent type 2 diabetes.  # Crohn's Disease - per primary  For questions or updates, please contact [place] Please consult [email or web address] for contact info under   Signed, Delene Ruffini, MD  10/21/2022, 12:06 PM     ATTENDING ATTESTATION  I have seen, examined and evaluated the patient this morning on rounds along with Resident Physician-Greylon Elliot Gurney, MD (IM R2).  After reviewing all the available data and chart, we discussed the patients laboratory, study & physical findings as well as symptoms in detail.  I agree with his findings, examination as well as impression recommendations as per our discussion.    Attending adjustments noted in italics.   Presented with non-STEMI and then had recurrent anginal pain yesterday requiring urgent catheterization found to have essentially occluded small caliber LCx-OM1 treated with PTCA but also 85% proximal LAD treated with DES PCI.  Doing well now.  Tolerating current meds.  ARB added yesterday, titrate up in the morning prior to discharge.  I think he is probably stable for discharge tomorrow morning provided his blood pressures are stable on current meds.  This will allow [place] to titrate the ARB further and assess his echo prior to discharge.  Would like for his PCP to strongly consider SGLT2 inhibitor or GLP-1 agonist with diabetes and cardiac disease   Anticipate discharge tomorrow.    Leonie Man, MD, MS Glenetta Hew, M.D., M.S. Interventional Cardiologist  [place]  Pager # [phone] Phone # [phone] [address] [place], [place] [address]

## 2022-10-21 NOTE — TOC Benefit Eligibility Note (Signed)
Patient Teacher, English as a foreign language completed.    The patient is currently admitted and upon discharge could be taking Jardiance 10 mg.  The current 30 day co-pay is $88.42.   The patient is currently admitted and upon discharge could be taking Farxiga 10 mg.  The current 30 day co-pay is $108.01.   The patient is currently admitted and upon discharge could be taking Brilinta 90 mg.  The current 30 day co-pay is $83.70.   The patient is insured through [employer]   This test claim was processed through [place] amounts may vary at other pharmacies due to pharmacy/plan contracts, or as the patient moves through the different stages of their insurance plan.  Lyndel Safe, [place] Patient Advocate Specialist [place] Patient Advocate Team Direct Number: [phone]  Fax: [phone]

## 2022-10-22 ENCOUNTER — Encounter: Payer: Self-pay | Admitting: Gastroenterology

## 2022-10-22 ENCOUNTER — Telehealth: Payer: Self-pay | Admitting: Gastroenterology

## 2022-10-22 ENCOUNTER — Other Ambulatory Visit (HOSPITAL_COMMUNITY): Payer: Self-pay

## 2022-10-22 DIAGNOSIS — Z955 Presence of coronary angioplasty implant and graft: Secondary | ICD-10-CM | POA: Diagnosis not present

## 2022-10-22 DIAGNOSIS — I25110 Atherosclerotic heart disease of native coronary artery with unstable angina pectoris: Secondary | ICD-10-CM | POA: Diagnosis not present

## 2022-10-22 DIAGNOSIS — K50114 Crohn's disease of large intestine with abscess: Secondary | ICD-10-CM

## 2022-10-22 DIAGNOSIS — I214 Non-ST elevation (NSTEMI) myocardial infarction: Secondary | ICD-10-CM | POA: Diagnosis not present

## 2022-10-22 LAB — CBC
HCT: 35.5 % — ABNORMAL LOW (ref 39.0–52.0)
Hemoglobin: 11.9 g/dL — ABNORMAL LOW (ref 13.0–17.0)
MCH: 33.5 pg (ref 26.0–34.0)
MCHC: 33.5 g/dL (ref 30.0–36.0)
MCV: 100 fL (ref 80.0–100.0)
Platelets: 131 10*3/uL — ABNORMAL LOW (ref 150–400)
RBC: 3.55 MIL/uL — ABNORMAL LOW (ref 4.22–5.81)
RDW: 13.2 % (ref 11.5–15.5)
WBC: 7.5 10*3/uL (ref 4.0–10.5)
nRBC: 0 % (ref 0.0–0.2)

## 2022-10-22 LAB — BASIC METABOLIC PANEL
Anion gap: 9 (ref 5–15)
BUN: 11 mg/dL (ref 6–20)
CO2: 25 mmol/L (ref 22–32)
Calcium: 8.7 mg/dL — ABNORMAL LOW (ref 8.9–10.3)
Chloride: 104 mmol/L (ref 98–111)
Creatinine, Ser: 1.12 mg/dL (ref 0.61–1.24)
GFR, Estimated: >60 mL/min (ref >60)
Glucose, Bld: 200 mg/dL — ABNORMAL HIGH (ref 70–99)
Potassium: 3.6 mmol/L (ref 3.5–5.1)
Sodium: 138 mmol/L (ref 135–145)

## 2022-10-22 LAB — LIPOPROTEIN A (LPA)
Lipoprotein (a): 103 nmol/L — ABNORMAL HIGH (ref <75.0)
Lipoprotein (a): 125.9 nmol/L — ABNORMAL HIGH (ref <75.0)

## 2022-10-22 LAB — HEMOGLOBIN A1C
Hgb A1c MFr Bld: 12.1 % — ABNORMAL HIGH (ref 4.8–5.6)
Mean Plasma Glucose: 301 mg/dL

## 2022-10-22 LAB — GLUCOSE, CAPILLARY
Glucose-Capillary: 156 mg/dL — ABNORMAL HIGH (ref 70–99)
Glucose-Capillary: 170 mg/dL — ABNORMAL HIGH (ref 70–99)

## 2022-10-22 MED ORDER — ASPIRIN 81 MG PO TBEC
81.0000 mg | DELAYED_RELEASE_TABLET | Freq: Every day | ORAL | 1 refills | Status: DC
  Filled 2022-10-22 – 2022-11-18 (×3): qty 30, 30d supply, fill #0

## 2022-10-22 MED ORDER — IRBESARTAN 150 MG PO TABS
150.0000 mg | ORAL_TABLET | Freq: Every day | ORAL | 1 refills | Status: DC
  Filled 2022-10-22 – 2022-11-11 (×2): qty 30, 30d supply, fill #0

## 2022-10-22 MED ORDER — IRBESARTAN 150 MG PO TABS: 75.0000 mg | ORAL_TABLET | Freq: Every day | ORAL | Status: DC

## 2022-10-22 MED ORDER — ROSUVASTATIN CALCIUM 40 MG PO TABS
40.0000 mg | ORAL_TABLET | Freq: Every day | ORAL | 1 refills | Status: DC
  Filled 2022-10-22 – 2022-11-11 (×2): qty 30, 30d supply, fill #0

## 2022-10-22 MED ORDER — EMPAGLIFLOZIN 10 MG PO TABS
10.0000 mg | ORAL_TABLET | Freq: Every day | ORAL | Status: DC
  Administered 2022-10-22: 10 mg via ORAL
  Filled 2022-10-22: qty 1

## 2022-10-22 MED ORDER — TICAGRELOR 90 MG PO TABS
90.0000 mg | ORAL_TABLET | Freq: Two times a day (BID) | ORAL | 1 refills | Status: DC
  Filled 2022-10-22 – 2022-11-11 (×2): qty 60, 30d supply, fill #0

## 2022-10-22 MED ORDER — EMPAGLIFLOZIN 10 MG PO TABS
10.0000 mg | ORAL_TABLET | Freq: Every day | ORAL | 0 refills | Status: DC
  Filled 2022-10-22: qty 30, 30d supply, fill #0

## 2022-10-22 MED ORDER — IRBESARTAN 150 MG PO TABS
150.0000 mg | ORAL_TABLET | Freq: Every day | ORAL | Status: DC
  Administered 2022-10-22: 150 mg via ORAL
  Filled 2022-10-22: qty 1

## 2022-10-22 MED ORDER — CARVEDILOL 12.5 MG PO TABS
12.5000 mg | ORAL_TABLET | Freq: Two times a day (BID) | ORAL | 1 refills | Status: DC
  Filled 2022-10-22 – 2022-11-11 (×2): qty 60, 30d supply, fill #0

## 2022-10-22 NOTE — Telephone Encounter (Signed)
Patient wife called wanting to let Dr. Loletha Carrow know that the patient was admitted to the ED this past Saturday 10/19/2022 and was discharged today 10/22/2022 due to heart attack, states patient also has started new medications and wanted Dr. Loletha Carrow to look at chart.

## 2022-10-22 NOTE — Progress Notes (Signed)
Pt walked 442ft around the unit with standby assist w/o CP and SOB.   BP after walk 122/80 HR after walk 108 ST  Pt feels good and denies questions from yesterday teachings.

## 2022-10-22 NOTE — Discharge Summary (Addendum)
Physician Discharge Summary  Sean Schaefer [id number] DOB: 11-Jan-1966 DOA: 10/19/2022  PCP: [name], MD  Admit date: 10/19/2022 Discharge date: 10/22/2022  Time spent: 35 minutes  Recommendations for Outpatient Follow-up:  Cardiology Dr.Harding in 72month 2. PCP in 1 week  Discharge Diagnoses:  Principal Problem:   NSTEMI (non-ST elevated myocardial infarction) Active Problems:   Crohn's disease ([place])   Uncontrolled type 2 diabetes mellitus with hyperglycemia, with long-term current use of insulin   Hyponatremia   AKI (acute kidney injury)   Hypertensive urgency   Hyperlipidemia   Coronary artery disease involving native coronary artery of native heart with unstable angina pectoris   Status post coronary artery stent placement   Discharge Condition: Improved  Diet recommendation: Low-sodium, heart healthy  Filed Weights   10/19/22 1902 10/20/22 0505  Weight: 97.1 kg 97.1 kg    History of present illness:  56/M with Crohn's disease, 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, uncontrolled type 2 diabetes mellitus and hypertension admitted with chest pain, NSTEMI -Cards consulted, started on IV heparin  Hospital Course:   NSTEMI -hs troponin peaked at 23 K -Treated with IV heparin infusion, aspirin, Coreg, statin -Underwent left cath 3/31, noted to have CAD with multivessel disease, proximal circumflex lesion treated with PTCA, and underwent PCI/stenting of proximal LAD -Follow-up echo with preserved EF -Continue aspirin, Coreg, Brilinta, Crestor -Follow-up with cardiology in 1 month   Hyperlipidemia -Crestor, dose increased   Hypertensive urgency -Improved, continue Coreg, ARB   AKI (acute kidney injury) ([place]) Creatinine is 1.59, increased from 1.02 to 1-year ago -Creatinine improving, 1.1 at discharge, started on ARB yesterday   Hyponatremia -Resolved   Hypokalemia -Repleted   Uncontrolled type 2 diabetes mellitus with hyperglycemia, glucose is  451->340s, no gap although some ketones in the urine Patient reports taking Humalog 10 units with meals and Tresiba 38 units at night -Hba1C still pending, continue home regimen of insulin, Jardiance added   Crohn's disease ([place]) Inflammatory bowel disease -Follow-up with GI  Procedures: LHC  POST-CATH DIAGNOSES Three-Vessel Disease: Culprit Lesion: 99% proximal LCx into 100% OM1, TIMI 0 flow-> Successful Balloon PTCA restoring TIMI-3 flow reducing to 30% stenosis. Focal ostial-proximal LAD 85% stenosis (TIMI-3 flow) followed by sequential 60% lesions in the mid and still vessel as well as 30% a goal with diffuse disease, 1 diminutive D1 1 followed by 2 small caliber D2 and D3 branches mild disease. -> Successful DES PCI of ostial to proximal LAD with Synergy DES 3.0 mm x 16 mm deployed to 3.3 mm -> reduced to 0% (TIMI 3 flow pre & post) Large dominant RCA with proximal 25% and distal 20% lesions. There is a small posterolateral branch in the anterior descending artery actually bifurcates into get again another posterolateral branch that cover the large portion of the circumflex territory. This vessel has still percent ostial disease.  Relatively normal LVEF with no obvious wall motion abnormality. Normal EDP.   Consultations: Cardiology  Discharge Exam: Vitals:   10/22/22 0742 10/22/22 1100  BP: (!) 151/95 139/77  Pulse: 84 77  Resp: 15 18  Temp: 97.7 F (36.5 C) 97.7 F (36.5 C)  SpO2:     Gen: Awake, Alert, Oriented X 3,  HEENT: no JVD Lungs: Good air movement bilaterally, CTAB CVS: S1S2/RRR Abd: soft, Non tender, non distended, BS present Extremities: No edema Skin: no new rashes on exposed skin   Discharge Instructions   Discharge Instructions     Amb Referral to Cardiac Rehabilitation  Complete by: As directed    Diagnosis:  Coronary Stents PTCA NSTEMI     After initial evaluation and assessments completed: Virtual Based Care may be provided alone or in  conjunction with Phase 2 Cardiac Rehab based on patient barriers.: Yes   Intensive Cardiac Rehabilitation (ICR) [place] location only OR Traditional Cardiac Rehabilitation (TCR) *If criteria for ICR are not met will enroll in TCR [facility] only): Yes   Diet - low sodium heart healthy   Complete by: As directed    Diet Carb Modified   Complete by: As directed    Increase activity slowly   Complete by: As directed       Allergies as of 10/22/2022       Reactions   Statins Other (See Comments)   Myalgias  Currently taking Crestor 10mg .        Medication List     TAKE these medications    aspirin EC 81 MG tablet Take 1 tablet (81 mg total) by mouth daily. Swallow whole. Start taking on: October 23, 2022   carvedilol 12.5 MG tablet Commonly known as: COREG Take 1 tablet (12.5 mg total) by mouth 2 (two) times daily with a meal.   empagliflozin 10 MG Tabs tablet Commonly known as: JARDIANCE Take 1 tablet (10 mg total) by mouth daily. Start taking on: October 23, 2022   irbesartan 150 MG tablet Commonly known as: AVAPRO Take 1 tablet (150 mg total) by mouth daily. Start taking on: October 23, 2022   rosuvastatin 40 MG tablet Commonly known as: Crestor Take 1 tablet (40 mg total) by mouth daily. What changed:  medication strength how much to take   ticagrelor 90 MG Tabs tablet Commonly known as: BRILINTA Take 1 tablet (90 mg total) by mouth 2 (two) times daily.   Tyler Aas FlexTouch 100 UNIT/ML FlexTouch Pen Generic drug: insulin degludec Inject 38 units into the skin once a day 90 days What changed:  how much to take when to take this       Allergies  Allergen Reactions   Statins Other (See Comments)    Myalgias  Currently taking Crestor 10mg .      The results of significant diagnostics from this hospitalization (including imaging, microbiology, ancillary and laboratory) are listed below for reference.    Significant Diagnostic Studies: ECHOCARDIOGRAM COMPLETE  Result  Date: 10/21/2022    ECHOCARDIOGRAM REPORT   Patient Name:   Sean Schaefer Date of Exam: 10/21/2022 Medical Rec #:  [id number]      Height:       70.0 in Accession #:    [id number]     Weight:       214.0 lb Date of Birth:  1965/07/27       BSA:          2.148 m Patient Age:    84 years       BP:           122/60 mmHg Patient Gender: M              HR:           97 bpm. Exam Location:  Inpatient Procedure: 2D Echo, Cardiac Doppler, Color Doppler and Intracardiac            Opacification Agent Indications:    I21.4 NSTEMI  History:        Patient has no prior history of Echocardiogram examinations.  Previous Myocardial Infarction; Risk Factors:Non-Smoker,                 Hypertension, Diabetes and Dyslipidemia.  Sonographer:    Wilkie Aye RVT RCS Referring Phys: 59 DAVID W [facility]  Sonographer Comments: Suboptimal apical window. IMPRESSIONS  1. Left ventricular ejection fraction, by estimation, is 70 to 75%. The left ventricle has hyperdynamic function. The left ventricle has no regional wall motion abnormalities. There is mild left ventricular hypertrophy. Indeterminate diastolic filling due to E-A fusion.  2. Right ventricular systolic function is normal. The right ventricular size is normal.  3. The mitral valve is normal in structure. Trivial mitral valve regurgitation.  4. The aortic valve is tricuspid. Aortic valve regurgitation is not visualized. Aortic valve sclerosis/calcification is present, without any evidence of aortic stenosis.  5. Aortic dilatation noted. There is moderate dilatation of the ascending aorta, measuring 43 mm.  6. The inferior vena cava is dilated in size with <50% respiratory variability, suggesting right atrial pressure of 15 mmHg. FINDINGS  Left Ventricle: Left ventricular ejection fraction, by estimation, is 70 to 75%. The left ventricle has hyperdynamic function. The left ventricle has no regional wall motion abnormalities. Definity contrast agent was given IV to  delineate the left ventricular endocardial borders. The left ventricular internal cavity size was normal in size. There is mild left ventricular hypertrophy. Indeterminate diastolic filling due to E-A fusion. Right Ventricle: The right ventricular size is normal. Right vetricular wall thickness was not assessed. Right ventricular systolic function is normal. Left Atrium: Left atrial size was normal in size. Right Atrium: Right atrial size was normal in size. Pericardium: There is no evidence of pericardial effusion. Mitral Valve: The mitral valve is normal in structure. Trivial mitral valve regurgitation. Tricuspid Valve: The tricuspid valve is normal in structure. Tricuspid valve regurgitation is trivial. Aortic Valve: The aortic valve is tricuspid. Aortic valve regurgitation is not visualized. Aortic valve sclerosis/calcification is present, without any evidence of aortic stenosis. Aortic valve mean gradient measures 3.0 mmHg. Aortic valve peak gradient measures 6.0 mmHg. Aortic valve area, by VTI measures 3.48 cm. Pulmonic Valve: The pulmonic valve was normal in structure. Pulmonic valve regurgitation is not visualized. Aorta: Aortic dilatation noted and the aortic root is normal in size and structure. There is moderate dilatation of the ascending aorta, measuring 43 mm. Venous: The inferior vena cava is dilated in size with less than 50% respiratory variability, suggesting right atrial pressure of 15 mmHg. IAS/Shunts: No atrial level shunt detected by color flow Doppler.  LEFT VENTRICLE PLAX 2D LVIDd:         3.80 cm   Diastology LVIDs:         2.10 cm   LV e' medial:    7.83 cm/s LV PW:         1.40 cm   LV E/e' medial:  10.3 LV IVS:        1.10 cm   LV e' lateral:   7.18 cm/s LVOT diam:     2.30 cm   LV E/e' lateral: 11.2 LV SV:         73 LV SV Index:   34 LVOT Area:     4.15 cm  RIGHT VENTRICLE             IVC RV Basal diam:  3.20 cm     IVC diam: 2.20 cm RV S prime:     16.50 cm/s LEFT ATRIUM  Index        RIGHT ATRIUM           Index LA diam:        3.70 cm 1.72 cm/m   RA Area:     14.90 cm LA Vol (A2C):   53.3 ml 24.81 ml/m  RA Volume:   41.50 ml  19.32 ml/m LA Vol (A4C):   51.2 ml 23.81 ml/m LA Biplane Vol: 60.3 ml 28.07 ml/m  AORTIC VALVE                    PULMONIC VALVE AV Area (Vmax):    3.68 cm     PV Vmax:       1.02 m/s AV Area (Vmean):   3.54 cm     PV Peak grad:  4.2 mmHg AV Area (VTI):     3.48 cm AV Vmax:           122.00 cm/s AV Vmean:          82.800 cm/s AV VTI:            0.209 m AV Peak Grad:      6.0 mmHg AV Mean Grad:      3.0 mmHg LVOT Vmax:         108.00 cm/s LVOT Vmean:        70.600 cm/s LVOT VTI:          0.175 m LVOT/AV VTI ratio: 0.84  AORTA Ao Root diam: 3.20 cm Ao Asc diam:  4.30 cm Ao Arch diam: 3.0 cm MITRAL VALVE MV Area (PHT): 4.19 cm    SHUNTS MV Decel Time: 181 msec    Systemic VTI:  0.18 m MV E velocity: 80.70 cm/s  Systemic Diam: 2.30 cm MV A velocity: 99.00 cm/s MV E/A ratio:  0.82 Dorris Carnes MD Electronically signed by Dorris Carnes MD Signature Date/Time: 10/21/2022/3:00:47 PM    Final    CARDIAC CATHETERIZATION  Result Date: 10/20/2022   CULPRIT Prox-Mid Cx lesion is 99% stenosed-> 1st Mrg lesion is 100% stenosed, TIMI 0 flow. -(1 lesion in segment)   Post intervention, there is a 30% residual stenosis throughout the entire segment restoring TIMI-3 flow.->   --------------------------------------------------------------------   Ost LAD to Prox LAD lesion is 85% stenosed.   A drug-eluting stent was successfully placed using a Synergy XD 3.0 x 16-deployed to 3.3 mm. Post intervention, there is a 0% residual stenosis.   Mid LAD to Dist LAD lesion is 60% stenosed. Dist LAD-1 lesion is 60% stenosed. Dist LAD-2 lesion is 30% stenosed.   ---------------------------------------------------------------------   Prox RCA lesion is 25% stenosed.  Dist RCA lesion is 20% stenosed.  2nd PL from rPDA is 60% stenosed.    ---------------------------------------------------------------------   The left ventricular systolic function is normal.  The left ventricular ejection fraction is 55-65% by visual estimate.  LV end diastolic pressure is normal.   There is no aortic valve stenosis. ================================================ POST-CATH DIAGNOSES Three-Vessel Disease: Culprit Lesion: 99% proximal LCx into 100% OM1, TIMI 0 flow-> Successful Balloon PTCA restoring TIMI-3 flow reducing to 30% stenosis. Focal ostial-proximal LAD 85% stenosis (TIMI-3 flow) followed by sequential 60% lesions in the mid and still vessel as well as 30% a goal with diffuse disease, 1 diminutive D1 1 followed by 2 small caliber D2 and D3 branches mild disease. -> Successful DES PCI of ostial to proximal LAD with Synergy DES 3.0 mm x 16 mm deployed to 3.3 mm -> reduced to  0% (TIMI 3 flow pre & post) Large dominant RCA with proximal 25% and distal 20% lesions. There is a small posterolateral branch in the anterior descending artery actually bifurcates into get again another posterolateral branch that cover the large portion of the circumflex territory. This vessel has still percent ostial disease.  Relatively normal LVEF with no obvious wall motion abnormality. Normal EDP. RECOMMENDATIONS The patient be transferred to [facility] for post-cath care with TR band removal Continue IV nitroglycerin as necessary for blood pressure control.  Use PRN's overnight and initiate irbesartan 37.5 mg along with carvedilol 12.5 mg twice daily Follow-up 2D echo Anticipate that he should be stable for discharge tomorrow Need to monitor for GI bleed with Crohn's, if necessary may need to discontinue aspirin after 1 month and continue Brilinta monotherapy. Increased statin dose from 20 mg rosuvastatin 40 mg.  Seems to be tolerating.  If not would need PCSK9 inhibitor versus inclisiran. Diabetic control per primary team of consider SGLT2 inhibitor +/- GLP-1  agonist. Glenetta Hew, MD   DG Chest Portable 1 View  Result Date: 10/19/2022 CLINICAL DATA:  Chest pain EXAM: PORTABLE CHEST 1 VIEW COMPARISON:  12/02/2013 FINDINGS: The heart size and mediastinal contours are within normal limits. Both lungs are clear. The visualized skeletal structures are unremarkable. IMPRESSION: Normal study. Electronically Signed   By: Rolm Baptise M.D.   On: 10/19/2022 19:03    Microbiology: No results found for this or any previous visit (from the past 240 hour(s)).   Labs: Basic Metabolic Panel: Recent Labs  Lab 10/19/22 1830 10/20/22 0049 10/20/22 0050 10/21/22 0023 10/22/22 0122  NA 131* 136  --  137 138  K 4.3 3.6  --  3.2* 3.6  CL 96* 100  --  102 104  CO2 22 22  --  25 25  GLUCOSE 451* 198*  --  150* 200*  BUN 21* 17  --  9 11  CREATININE 1.59* 1.25*  --  0.93 1.12  CALCIUM 8.8* 9.0  --  8.1* 8.7*  MG  --   --  2.0  --   --    Liver Function Tests: Recent Labs  Lab 10/19/22 1830 10/20/22 0049  AST 50* 163*  ALT 17 27  ALKPHOS 64 65  BILITOT 0.9 0.9  PROT 8.0 7.7  ALBUMIN 3.4* 3.5   Recent Labs  Lab 10/19/22 1830  LIPASE 44   No results for input(s): "AMMONIA" in the last 168 hours. CBC: Recent Labs  Lab 10/19/22 1830 10/20/22 0050 10/21/22 0023 10/22/22 0122  WBC 8.3 7.9 7.5 7.5  NEUTROABS 6.7  --   --   --   HGB 13.1 13.9 11.1* 11.9*  HCT 38.8* 38.4* 31.1* 35.5*  MCV 96.3 93.4 95.4 100.0  PLT 161 161 128* 131*   Cardiac Enzymes: No results for input(s): "CKTOTAL", "CKMB", "CKMBINDEX", "TROPONINI" in the last 168 hours. BNP: BNP (last 3 results) No results for input(s): "BNP" in the last 8760 hours.  ProBNP (last 3 results) No results for input(s): "PROBNP" in the last 8760 hours.  CBG: Recent Labs  Lab 10/21/22 1110 10/21/22 1658 10/21/22 2124 10/22/22 0627 10/22/22 1059  GLUCAP 148* 123* 229* 156* 170*       Signed:  Domenic Polite MD.  [facility] 10/22/2022, 11:17 AM

## 2022-10-22 NOTE — Progress Notes (Addendum)
Rounding Note    Patient Name: Sean Schaefer Date of Encounter: 10/22/2022  [place] Cardiologist: None   Subjective   Patient feels well this morning. He has no complaints and is eager to go home. Discussed importance of medication adherence with patient. He will be seen in cardiology clinic for outpatient follow up.   Inpatient Medications    Scheduled Meds:  aspirin EC  81 mg Oral Daily   carvedilol  12.5 mg Oral BID WC   empagliflozin  10 mg Oral Daily   insulin aspart  0-20 Units Subcutaneous TID WC & HS   insulin glargine-yfgn  30 Units Subcutaneous Q2200   Insulin Pen Needle  1 packet Subcutaneous QHS   irbesartan  150 mg Oral Daily   rosuvastatin  40 mg Oral Daily   sodium chloride flush  3 mL Intravenous Q12H   sodium chloride flush  3 mL Intravenous Q12H   ticagrelor  90 mg Oral BID   Continuous Infusions:  sodium chloride 75 mL/hr at 10/20/22 1429   sodium chloride     nitroGLYCERIN 5 mcg/min (10/21/22 0503)   PRN Meds: sodium chloride, acetaminophen **OR** acetaminophen, HYDROmorphone (DILAUDID) injection, metoprolol tartrate, ondansetron (ZOFRAN) IV, oxyCODONE, sodium chloride flush   Vital Signs    Vitals:   10/21/22 2329 10/22/22 0335 10/22/22 0742 10/22/22 1100  BP: 139/76 (!) 140/88 (!) 151/95 139/77  Pulse: 89 85 84 77  Resp: 17 15 15 18   Temp: 98.8 F (37.1 C) 98.6 F (37 C) 97.7 F (36.5 C) 97.7 F (36.5 C)  TempSrc: Oral Oral Oral Oral  SpO2: 96% 98%    Weight:      Height:        Intake/Output Summary (Last 24 hours) at 10/22/2022 1343 Last data filed at 10/22/2022 [id number] Gross per 24 hour  Intake 603 ml  Output --  Net 603 ml      10/20/2022    5:05 AM 10/19/2022    7:02 PM 10/10/2022    8:33 AM  Last 3 Weights  Weight (lbs) 214 lb 0.7 oz 214 lb 212 lb 6.4 oz  Weight (kg) 97.09 kg 97.07 kg 96.344 kg      Telemetry    Normal sinus rhythm - Personally Reviewed  ECG    NSR, improved TWI - Personally  Reviewed  Physical Exam   GEN: No acute distress.   Neck: No JVD Cardiac: RRR, no murmurs, rubs, or gallops.  Respiratory: Clear to auscultation bilaterally. Breathing comfortably on RA GI: Soft, nontender, non-distended  MS: No edema; No deformity. Neuro:  Nonfocal  Psych: Normal affect, alert and oriented   Labs    High Sensitivity Troponin:   Recent Labs  Lab 10/19/22 1830 10/19/22 2120 10/20/22 0050 10/20/22 0329  TROPONINIHS 2,264* 7,237* 16,113* 23,366*     Chemistry Recent Labs  Lab 10/19/22 1830 10/20/22 0049 10/20/22 0050 10/21/22 0023 10/22/22 0122  NA 131* 136  --  137 138  K 4.3 3.6  --  3.2* 3.6  CL 96* 100  --  102 104  CO2 22 22  --  25 25  GLUCOSE 451* 198*  --  150* 200*  BUN 21* 17  --  9 11  CREATININE 1.59* 1.25*  --  0.93 1.12  CALCIUM 8.8* 9.0  --  8.1* 8.7*  MG  --   --  2.0  --   --   PROT 8.0 7.7  --   --   --  ALBUMIN 3.4* 3.5  --   --   --   AST 50* 163*  --   --   --   ALT 17 27  --   --   --   ALKPHOS 64 65  --   --   --   BILITOT 0.9 0.9  --   --   --   GFRNONAA 51* >60  --  >60 >60  ANIONGAP 13 14  --  10 9    Lipids  Recent Labs  Lab 10/21/22 0023  CHOL 132  TRIG 99  HDL 50  LDLCALC 62  CHOLHDL 2.6    Hematology Recent Labs  Lab 10/20/22 0050 10/21/22 0023 10/22/22 0122  WBC 7.9 7.5 7.5  RBC 4.11* 3.26* 3.55*  HGB 13.9 11.1* 11.9*  HCT 38.4* 31.1* 35.5*  MCV 93.4 95.4 100.0  MCH 33.8 34.0 33.5  MCHC 36.2* 35.7 33.5  RDW 12.9 13.1 13.2  PLT 161 128* 131*   Thyroid No results for input(s): "TSH", "FREET4" in the last 168 hours.  BNPNo results for input(s): "BNP", "PROBNP" in the last 168 hours.  DDimer No results for input(s): "DDIMER" in the last 168 hours.   Radiology    No new radiology studies  Cardiac Studies   10/21/22 ECHO: Hyperdynamic LV with EF 7075%.  No RWMA.  Mild LVH.  Indeterminate diastolic filling due to ED effusion.  Aortic valve sclerosis but no stenosis.  Moderate dilation of the  ascending aorta 43 mm.  Dilated IVC suggest increased RAP  Cardiac Cath-PCI 10/20/2022: 99%-100% LCx-OM1 - (small caliber ~ <2 mm) -> PTCA only; proximal LAD 85%-Synergy XD 3.0 x 16 mm - 3.3 mm. Diagnostic Dominance: Right     Intervention   Patient Profile     57 y.o. male with hx of DM, HTN, crohn's disease, and HLD who presented for was evaluated for chest pain at the request of hospital medicine service -> ruled in for non-STEMI, had progressive increase in troponin levels and then recurrence of anginal pain.  Taken to the Cath Lab for urgent catheterization for postinfarct angina, found to have three-vessel disease with PTCA of LCx-OM 1 as well as DES PCI to the proximal LAD.Marland Kitchen   Assessment & Plan    # NSTEMI S/p cath with PTCA to Lcx-OM and DES proximal LAD. Currently on aspirin and brilinta. Stressed antiplatelet adherence to prevent in-stent re-thrombosis.   ECHO showing hyperdynamic function 70-75% with no regional wall motion abnormalities and grossly normal structure. No DD so does not need diuretic at this time.  He has remained HDS.  - We will continue DAPT with ASA and Brilinta 90 mg BID - > guideline recommendation is uninterrupted DAPT for 1 year although if there are bleeding issues with Crohn's, can stop aspirin after 3 months. - continue Coreg 12.5 mg twice daily, and irbesartan increased to 75 mg daily. - Continue Crestor-increased to 40 mg daily. - discharge today - cardiac rehab - outpatient follow up 4/15 - TOC meds/ pharmacy medication education  # HTN A little hypertensive this morning. His renal function and electrolyte are doing well so irbesartan was increased to 75mg  this AM. He will need a BMP checked at follow up.  - continue Coreg 12.5 BID - Irbesartan 75   # HLD LDL 113. Goal at least less than 70, ideally 55. LPA 125.  - Crestor 40mg   # DM Poorly controlled. A1c 12 2/2 dietary indiscretion.  He has been started on Jardiance 10mg .  Uses insulnis as  outpatient. May benefit from GLP initiation as well.  - Jardiance 10mg   # Crohn's Disease - per primary  For questions or updates, please contact [place] Please consult [email or web address] for contact info under   Signed, Delene Ruffini, MD  10/22/2022, 1:43 PM     ATTENDING ATTESTATION  I have seen, examined and evaluated the patient this morning on rounds, prior to discharge along with Dr. Elliot Gurney (IM Resident).  After reviewing all the available data and chart, we discussed the patients laboratory, study & physical findings as well as symptoms in detail.  I agree with his findings, examination as well as impression recommendations as per our discussion.    Attending adjustments noted in italics.   Doing well 2 days post PCI.  Renal function stable as his hemoglobin levels.  Medications have been adjusted with increasing dose of ARB today.  Can titrate further in the outpatient setting.  We also added Jardiance 10 mg daily for DM 2 with CAD. Hyperdynamic LV on echo therefore not necessarily issues concerning for heart failure other than mixed diastolic dysfunction.  Dilated IVC is somewhat unusual as he does not have any edema.  He also has mild aortic dilation which can be evaluated in the outpatient setting CT angiogram of the aorta in 1 year.  We considered him for SOS MI trial, however with concerns for potential bleeding felt that he may not be appropriate.  Not appropriate candidate for Librexia Trial 2/2 Crohn's disease with intermittent GI bleeding.  Overall very stable post PCI with no active angina.  He has been walking up and on the hallways with no issues.  Stable for discharge on current meds.  Discharge medication plan discussed Dr. Broad[facility] Attending)  Will establish close follow-up with me in the outpatient setting.    Leonie Man, MD, MS Glenetta Hew, M.D., M.S. Interventional Cardiologist  [facility]  Pager # [phone] Phone #  [phone] [address] [place], [place] [address]

## 2022-10-23 ENCOUNTER — Other Ambulatory Visit (HOSPITAL_COMMUNITY): Payer: Self-pay

## 2022-10-23 MED ORDER — FREESTYLE LIBRE 14 DAY SENSOR MISC
3 refills | Status: DC
  Filled 2022-10-23: qty 2, 28d supply, fill #0
  Filled 2022-11-22: qty 2, 28d supply, fill #1
  Filled 2022-12-20: qty 2, 28d supply, fill #2

## 2022-10-24 ENCOUNTER — Encounter: Payer: Self-pay | Admitting: Gastroenterology

## 2022-10-24 ENCOUNTER — Other Ambulatory Visit (HOSPITAL_COMMUNITY): Payer: Self-pay

## 2022-10-24 LAB — HEMOGLOBIN A1C
Hgb A1c MFr Bld: 12.1 % — ABNORMAL HIGH (ref 4.8–5.6)
Mean Plasma Glucose: 301 mg/dL

## 2022-10-25 ENCOUNTER — Encounter: Payer: Self-pay | Admitting: Gastroenterology

## 2022-10-25 ENCOUNTER — Other Ambulatory Visit (HOSPITAL_COMMUNITY): Payer: Self-pay

## 2022-10-28 DIAGNOSIS — E1169 Type 2 diabetes mellitus with other specified complication: Secondary | ICD-10-CM | POA: Diagnosis not present

## 2022-10-28 DIAGNOSIS — I2510 Atherosclerotic heart disease of native coronary artery without angina pectoris: Secondary | ICD-10-CM | POA: Diagnosis not present

## 2022-10-28 DIAGNOSIS — I10 Essential (primary) hypertension: Secondary | ICD-10-CM | POA: Diagnosis not present

## 2022-10-28 DIAGNOSIS — K5090 Crohn's disease, unspecified, without complications: Secondary | ICD-10-CM | POA: Diagnosis not present

## 2022-10-31 ENCOUNTER — Telehealth: Payer: Self-pay | Admitting: Cardiology

## 2022-10-31 NOTE — Telephone Encounter (Signed)
LVM to call office.

## 2022-10-31 NOTE — Telephone Encounter (Signed)
Pt c/o medication issue:  1. Name of Medication: ticagrelor (BRILINTA) 90 MG TABS tablet  aspirin EC 81 MG tablet   2. How are you currently taking this medication (dosage and times per day)? As prescribed   3. Are you having a reaction (difficulty breathing--STAT)? Yes   4. What is your medication issue? Wife reports patient is bleeding a lot in his private part area. States he has been bleeding since he was put on the blood thinner. Please advise.

## 2022-11-01 ENCOUNTER — Other Ambulatory Visit (HOSPITAL_COMMUNITY): Payer: Self-pay

## 2022-11-01 NOTE — Telephone Encounter (Signed)
Have him hold ASA until bleeding stops [facility]

## 2022-11-01 NOTE — Telephone Encounter (Signed)
LVM on wife's number for patient to stop the aspirin per Dr Elissa Hefty message  Have him hold ASA until bleeding stops DH  Called husband and spoke with him and gave instructions to hold the aspirin as well.  He states understanding.

## 2022-11-01 NOTE — Telephone Encounter (Signed)
Wife states she is not how much bleeding just said that it it is a lot there. I asked if it is with urination and she states yes, but then once discussed further she states it is from the rectum not other area.  She states her husband stated he was not sure why she called since he is coming in Monday for an appt.  He states it is fine until then.  Wife says It is not continuous bleeding but uses a lot of tissue when using the rest room.She states he was wearing a pad but not sure how often he changes them.  Difficult to gauge the amount with her answers.  Advised if it increases or he is saturating more than 3 pads in 1-2 hours, or has to change clothes due to bleeding, then he should be seen in the ED. Otherwise we will evaluate Monday.  She agrees .

## 2022-11-04 ENCOUNTER — Other Ambulatory Visit (HOSPITAL_COMMUNITY): Payer: Self-pay

## 2022-11-04 ENCOUNTER — Other Ambulatory Visit: Payer: Self-pay

## 2022-11-04 ENCOUNTER — Encounter: Payer: Self-pay | Admitting: Nurse Practitioner

## 2022-11-04 ENCOUNTER — Ambulatory Visit: Payer: 59 | Attending: Nurse Practitioner | Admitting: Nurse Practitioner

## 2022-11-04 VITALS — BP 126/84 | HR 85 | Ht 70.0 in | Wt 203.2 lb

## 2022-11-04 DIAGNOSIS — E1165 Type 2 diabetes mellitus with hyperglycemia: Secondary | ICD-10-CM

## 2022-11-04 DIAGNOSIS — I77810 Thoracic aortic ectasia: Secondary | ICD-10-CM

## 2022-11-04 DIAGNOSIS — K50919 Crohn's disease, unspecified, with unspecified complications: Secondary | ICD-10-CM | POA: Diagnosis not present

## 2022-11-04 DIAGNOSIS — E785 Hyperlipidemia, unspecified: Secondary | ICD-10-CM | POA: Diagnosis not present

## 2022-11-04 DIAGNOSIS — I2510 Atherosclerotic heart disease of native coronary artery without angina pectoris: Secondary | ICD-10-CM

## 2022-11-04 DIAGNOSIS — I10 Essential (primary) hypertension: Secondary | ICD-10-CM

## 2022-11-04 DIAGNOSIS — Z794 Long term (current) use of insulin: Secondary | ICD-10-CM | POA: Diagnosis not present

## 2022-11-04 MED ORDER — NITROGLYCERIN 0.4 MG SL SUBL
0.4000 mg | SUBLINGUAL_TABLET | SUBLINGUAL | 3 refills | Status: AC | PRN
  Filled 2022-11-04: qty 25, 8d supply, fill #0

## 2022-11-04 NOTE — Progress Notes (Signed)
Office Visit    Patient Name: Sean Schaefer Date of Encounter: 11/04/2022  Primary Care Provider:  Renford Dills, MD Primary Cardiologist:  Bryan Lemma, MD  Chief Complaint    57 year old male with a history of CAD s/p NSTEMI, DES-pLAD, PTCA-pLCx, hypertension, hyperlipidemia, Crohn's disease, and type 2 diabetes who presents for hospital follow-up related to CAD s/p NSTEMI.  Past Medical History    Past Medical History:  Diagnosis Date   Crohn's disease    Diabetes mellitus without complication    Hypertension    Ulcerative colitis    Past Surgical History:  Procedure Laterality Date   COLON SURGERY     COLONOSCOPY     CORONARY BALLOON ANGIOPLASTY N/A 10/20/2022   Procedure: CORONARY BALLOON ANGIOPLASTY;  Surgeon: Marykay Lex, MD;  Location: [facility] INVASIVE CV LAB;  Service: Cardiovascular;  Laterality: N/A;   CORONARY STENT INTERVENTION N/A 10/20/2022   Procedure: CORONARY STENT INTERVENTION;  Surgeon: Marykay Lex, MD;  Location: [facility] INVASIVE CV LAB;  Service: Cardiovascular;  Laterality: N/A;   EYE SURGERY Left    FLEXIBLE SIGMOIDOSCOPY N/A 02/05/2021   Procedure: FLEXIBLE POUCHOSCOPY WITH BIOPSY;  Surgeon: Andria Meuse, MD;  Location: [facility];  Service: General;  Laterality: N/A;   INCISION AND DRAINAGE ABSCESS N/A 02/05/2021   Procedure: INCISION AND DRAINAGE OF PERIANAL ABSCESS;  Surgeon: Andria Meuse, MD;  Location: [facility];  Service: General;  Laterality: N/A;   INCISION AND DRAINAGE PERIRECTAL ABSCESS N/A 11/30/2020   Procedure: IRRIGATION AND DEBRIDEMENT PERIRECTAL ABSCESS;  Surgeon: Fritzi Mandes, MD;  Location: [facility] OR;  Service: General;  Laterality: N/A;   INCISION AND DRAINAGE PERIRECTAL ABSCESS Left 04/03/2021   Procedure: left peri-rectal/gluteal abscess;  Surgeon: Diamantina Monks, MD;  Location: [facility] OR;  Service: General;  Laterality: Left;   LEFT HEART CATH AND CORONARY ANGIOGRAPHY N/A 10/20/2022   Procedure: LEFT HEART CATH AND  CORONARY ANGIOGRAPHY;  Surgeon: Marykay Lex, MD;  Location: [facility] INVASIVE CV LAB;  Service: Cardiovascular;  Laterality: N/A;   Perianal fistula repair  07/22/2010   PLACEMENT OF SETON N/A 02/05/2021   Procedure: PLACEMENT OF SETONS X2;  Surgeon: Andria Meuse, MD;  Location: [facility];  Service: General;  Laterality: N/A;   RECTAL EXAM UNDER ANESTHESIA N/A 02/05/2021   Procedure: ANORECTAL EXAM UNDER ANESTHESIA;  Surgeon: Andria Meuse, MD;  Location: [facility];  Service: General;  Laterality: N/A;   TESTICLE REMOVAL Left 2000    Allergies  Allergies  Allergen Reactions   Statins Other (See Comments)    Myalgias  Currently taking Crestor 10mg .     Labs/Other Studies Reviewed    The following studies were reviewed today: 10/21/22 ECHO: Hyperdynamic LV with EF 7075%.  No RWMA.  Mild LVH.  Indeterminate diastolic filling due to ED effusion.  Aortic valve sclerosis but no stenosis.  Moderate dilation of the ascending aorta 43 mm.  Dilated IVC suggest increased RAP   Cardiac Cath-PCI 10/20/2022: 99%-100% LCx-OM1 - (small caliber ~ <2 mm) -> PTCA only; proximal LAD 85%-Synergy XD 3.0 x 16 mm - 3.3 mm. Recent Labs: 10/20/2022: ALT 27; Magnesium 2.0 10/22/2022: BUN 11; Creatinine, Ser 1.12; Hemoglobin 11.9; Platelets 131; Potassium 3.6; Sodium 138  Recent Lipid Panel    Component Value Date/Time   CHOL 132 10/21/2022 0023   CHOL 206 (H) 02/04/2018 1720   TRIG 99 10/21/2022 0023   HDL 50 10/21/2022 0023   HDL 38 (L) 02/04/2018 1720   CHOLHDL  2.6 10/21/2022 0023   VLDL 20 10/21/2022 0023   LDLCALC 62 10/21/2022 0023   LDLCALC 105 (H) 02/04/2018 1720   LDLDIRECT 76.0 06/01/2019 1039    History of Present Illness   57 year old male with the above past medical history including CAD s/p NSTEMI, DES-pLAD, PTCA-pLCx, dilation of the ascending aorta, hypertension, hyperlipidemia, Crohn's disease, and type 2 diabetes.  He presented to the ED on 10/19/2022 with chest pain.  He ruled  in for NSTEMI.  Cardiac catheterization revealed three-vessel disease.  He underwent PTCA-LCx/OM1, as well as DES-pLAD.  Echocardiogram showed hyperdynamic function, EF 70 to 75%, mild LVH, no RWMA, moderate dilation of the ascending aorta measuring 43 mm.  He was started on DAPT with aspirin and Brilinta.  Additionally his blood pressure was elevated.  Irbesartan was increased to 75 mg daily.  He was started on Crestor.  He did have mild AKI.  He was discharged home in stable condition on 10/22/2022.  He contacted our office on 10/31/2022 with reports rectal bleeding.  He was advised to hold aspirin.   He presents today for follow-up accompanied by his wife.  Since his hospitalization he has done well from a cardiac standpoint.  He is taking his aspirin again.  He notes that it is not unusual for him to have bleeding from his Crohn's, particularly when he is almost due for his Remicade infusion.  He denies any symptoms concerning for angina.  He is eager to get back to work and his normal activities.  Overall, he reports feeling well.  Home Medications    Current Outpatient Medications  Medication Sig Dispense Refill   aspirin EC 81 MG tablet Take 1 tablet (81 mg total) by mouth daily. Swallow whole. 30 tablet 1   azaTHIOprine (IMURAN) 50 MG tablet Take 50 mg by mouth daily.     carvedilol (COREG) 12.5 MG tablet Take 1 tablet (12.5 mg total) by mouth 2 (two) times daily with a meal. 60 tablet 1   Continuous Blood Gluc Sensor (FREESTYLE LIBRE 14 DAY SENSOR) MISC Apply 1 sensor to the back of upper arm every 14 days 2 each 3   empagliflozin (JARDIANCE) 10 MG TABS tablet Take 1 tablet (10 mg total) by mouth daily. 30 tablet 0   insulin degludec (TRESIBA FLEXTOUCH) 100 UNIT/ML FlexTouch Pen Inject 38 units into the skin once a day 90 days (Patient taking differently: Inject 38 Units into the skin at bedtime.) 3500 mL 3   insulin lispro (HUMALOG) 100 UNIT/ML injection Inject 8 Units into the skin 3 (three)  times daily with meals.     irbesartan (AVAPRO) 150 MG tablet Take 1 tablet (150 mg total) by mouth daily. 30 tablet 1   nitroGLYCERIN (NITROSTAT) 0.4 MG SL tablet Place 1 tablet (0.4 mg total) under the tongue every 5 (five) minutes as needed for chest pain. 45 tablet 3   rosuvastatin (CRESTOR) 40 MG tablet Take 1 tablet (40 mg total) by mouth daily. 30 tablet 1   ticagrelor (BRILINTA) 90 MG TABS tablet Take 1 tablet (90 mg total) by mouth 2 (two) times daily. 60 tablet 1   Current Facility-Administered Medications  Medication Dose Route Frequency Provider Last Rate Last Admin   Insulin Pen Needle (NOVOFINE) 10 each  1 packet Subcutaneous QHS Ofilia Neas, PA-C         Review of Systems    He denies chest pain, palpitations, dyspnea, pnd, orthopnea, n, v, dizziness, syncope, edema, weight gain, or early satiety.  All other systems reviewed and are otherwise negative except as noted above.    Cardiac Rehabilitation Eligibility Assessment  The patient is ready to start cardiac rehabilitation from a cardiac standpoint.    Physical Exam    VS:  BP 126/84 (BP Location: Right Arm, Patient Position: Sitting, Cuff Size: Large)   Pulse 85   Ht  (1.778 m)   Wt 203 lb 3.2 oz (92.2 kg)   SpO2 97%   BMI 29.16 kg/m   GEN: Well nourished, well developed, in no acute distress. HEENT: normal. Neck: Supple, no JVD, carotid bruits, or masses. Cardiac: RRR, no murmurs, rubs, or gallops. No clubbing, cyanosis, edema.  Radials/DP/PT 2+ and equal bilaterally.  Right radial cath site with mild bruising, no bleeding, or hematoma. Respiratory:  Respirations regular and unlabored, clear to auscultation bilaterally. GI: Soft, nontender, nondistended, BS + x 4. MS: no deformity or atrophy. Skin: warm and dry, no rash. Neuro:  Strength and sensation are intact. Psych: Normal affect.  Accessory Clinical Findings    ECG personally reviewed by me today -NSR, 85 bpm- no acute changes.   Lab  Results  Component Value Date   WBC 7.5 10/22/2022   HGB 11.9 (L) 10/22/2022   HCT 35.5 (L) 10/22/2022   MCV 100.0 10/22/2022   PLT 131 (L) 10/22/2022   Lab Results  Component Value Date   CREATININE 1.12 10/22/2022   BUN 11 10/22/2022   NA 138 10/22/2022   K 3.6 10/22/2022   CL 104 10/22/2022   CO2 25 10/22/2022   Lab Results  Component Value Date   ALT 27 10/20/2022   AST 163 (H) 10/20/2022   ALKPHOS 65 10/20/2022   BILITOT 0.9 10/20/2022   Lab Results  Component Value Date   CHOL 132 10/21/2022   HDL 50 10/21/2022   LDLCALC 62 10/21/2022   LDLDIRECT 76.0 06/01/2019   TRIG 99 10/21/2022   CHOLHDL 2.6 10/21/2022    Lab Results  Component Value Date   HGBA1C 12.1 (H) 10/21/2022    Assessment & Plan   1. CAD: S/p NSTEMI, DES-pLAD, PTCA-pLCx/OM1. Stable with no anginal symptoms.  He did have bleeding in the setting of Crohn's, stable.  He is back on aspirin and Brilinta and tolerating this well. Will check CBC, BMET.  He is interested in cardiac rehab.  He may return to work.  Continue aspirin, Brilinta, carvedilol, irbesartan, Jardiance, and Crestor.   2. Aortic dilation: Measured 43 mm on most recent echo in 09/2022.  Will plan to monitor annually with either CT angio chest/aorta versus echocardiogram. BP well controlled.   3. Hypertension: BP well controlled. Continue current antihypertensive regimen.   4. Hyperlipidemia: LDL was 62 in 10/2022. Crestor was increased to 40 mg daily during recent hospitalization.  Will repeat fasting lipid panel, LFTs in 6 to 8 weeks.  5. Type 2 diabetes: A1c was  12.0 in 10/2022.  Monitored and managed per PCP.  6. Crohn's disease: Follows with GI.  7. Disposition: Follow-up in 3 months.       Joylene Grapes, NP 11/04/2022, 9:12 AM

## 2022-11-04 NOTE — Patient Instructions (Addendum)
Medication Instructions:  Nitroglycerin 0.4 mg/sl take as needed for chest pain  *If you need a refill on your cardiac medications before your next appointment, please call your pharmacy*   Lab Work: Your physician recommends that you complete lab work today and  return for lab work in 6-8 weeks. CBC & BMET (today) Fasting Lipid panel & LFTs (6-8 weeks)   If you have labs (blood work) drawn today and your tests are completely normal, you will receive your results only by: MyChart Message (if you have MyChart) OR A paper copy in the mail If you have any lab test that is abnormal or we need to change your treatment, we will call you to review the results.   Testing/Procedures: NONE ordered at this time of appointment     Follow-Up: At [facility], you and your health needs are our priority.  As part of our continuing mission to provide you with exceptional heart care, we have created designated Provider Care Teams.  These Care Teams include your primary Cardiologist (physician) and Advanced Practice Providers (APPs -  Physician Assistants and Nurse Practitioners) who all work together to provide you with the care you need, when you need it.  We recommend signing up for the patient portal called "MyChart".  Sign up information is provided on this After Visit Summary.  MyChart is used to connect with patients for Virtual Visits (Telemedicine).  Patients are able to view lab/test results, encounter notes, upcoming appointments, etc.  Non-urgent messages can be sent to your provider as well.   To learn more about what you can do with MyChart, go to [email or web address].    Your next appointment:   3 month(s)  Provider:   Bernadene Person, NP        Other Instructions

## 2022-11-05 LAB — CBC
Hematocrit: 38.9 % (ref 37.5–51.0)
Hemoglobin: 13.1 g/dL (ref 13.0–17.7)
MCH: 33.2 pg — ABNORMAL HIGH (ref 26.6–33.0)
MCHC: 33.7 g/dL (ref 31.5–35.7)
MCV: 99 fL — ABNORMAL HIGH (ref 79–97)
Platelets: 214 10*3/uL (ref 150–450)
RBC: 3.95 x10E6/uL — ABNORMAL LOW (ref 4.14–5.80)
RDW: 12.5 % (ref 11.6–15.4)
WBC: 4.9 10*3/uL (ref 3.4–10.8)

## 2022-11-05 LAB — BASIC METABOLIC PANEL
BUN/Creatinine Ratio: 17 (ref 9–20)
BUN: 21 mg/dL (ref 6–24)
CO2: 18 mmol/L — ABNORMAL LOW (ref 20–29)
Calcium: 9.5 mg/dL (ref 8.7–10.2)
Chloride: 101 mmol/L (ref 96–106)
Creatinine, Ser: 1.24 mg/dL (ref 0.76–1.27)
Glucose: 170 mg/dL — ABNORMAL HIGH (ref 70–99)
Potassium: 5.1 mmol/L (ref 3.5–5.2)
Sodium: 135 mmol/L (ref 134–144)
eGFR: 69 mL/min/{1.73_m2} (ref >59)

## 2022-11-06 ENCOUNTER — Telehealth (HOSPITAL_COMMUNITY): Payer: Self-pay

## 2022-11-06 NOTE — Telephone Encounter (Signed)
Called patient to see if he was interested in participating in the Cardiac Rehab Program. Patient stated yes. Patient will come in for orientation on 11/14/22@8am  and will attend the 815 exercise class.   Sent packet via [employer]

## 2022-11-07 ENCOUNTER — Ambulatory Visit (INDEPENDENT_AMBULATORY_CARE_PROVIDER_SITE_OTHER): Payer: 59

## 2022-11-07 VITALS — BP 120/84 | HR 83 | Temp 98.0°F | Resp 18 | Ht 70.0 in | Wt 203.2 lb

## 2022-11-07 DIAGNOSIS — K50114 Crohn's disease of large intestine with abscess: Secondary | ICD-10-CM

## 2022-11-07 MED ORDER — SODIUM CHLORIDE 0.9 % IV SOLN
5.0000 mg/kg | Freq: Once | INTRAVENOUS | Status: AC
  Administered 2022-11-07: 500 mg via INTRAVENOUS
  Filled 2022-11-07: qty 50

## 2022-11-07 MED ORDER — METHYLPREDNISOLONE SODIUM SUCC 40 MG IJ SOLR
40.0000 mg | Freq: Once | INTRAMUSCULAR | Status: AC
  Administered 2022-11-07: 40 mg via INTRAVENOUS
  Filled 2022-11-07: qty 1

## 2022-11-07 MED ORDER — ACETAMINOPHEN 325 MG PO TABS
650.0000 mg | ORAL_TABLET | Freq: Once | ORAL | Status: AC
  Administered 2022-11-07: 650 mg via ORAL
  Filled 2022-11-07: qty 2

## 2022-11-07 MED ORDER — DIPHENHYDRAMINE HCL 25 MG PO CAPS
25.0000 mg | ORAL_CAPSULE | Freq: Once | ORAL | Status: AC
  Administered 2022-11-07: 25 mg via ORAL
  Filled 2022-11-07: qty 1

## 2022-11-07 NOTE — Progress Notes (Signed)
Diagnosis: Crohn's Disease  Provider:  Chilton Greathouse MD  Procedure: Infusion  IV Type: Peripheral, IV Location: R Forearm  Remicade (Infliximab), Dose: 500 mg  Infusion Start Time: 1007  Infusion Stop Time: 1226  Post Infusion IV Care: Peripheral IV Discontinued  Discharge: Condition: Good, Destination: Home . AVS Declined  Performed by:  Nat Math, RN

## 2022-11-08 ENCOUNTER — Telehealth: Payer: Self-pay

## 2022-11-08 NOTE — Telephone Encounter (Signed)
Left message on machine. Pt was notified of lab results. Pt advised to call back with any questions or concerns if needed.

## 2022-11-11 ENCOUNTER — Other Ambulatory Visit (HOSPITAL_COMMUNITY): Payer: Self-pay

## 2022-11-12 ENCOUNTER — Telehealth: Payer: Self-pay | Admitting: Nurse Practitioner

## 2022-11-12 ENCOUNTER — Other Ambulatory Visit (HOSPITAL_COMMUNITY): Payer: Self-pay

## 2022-11-12 NOTE — Telephone Encounter (Signed)
 *  STAT* If patient is at the pharmacy, call can be transferred to refill team.   1. Which medications need to be refilled? (please list name of each medication and dose if known)  carvedilol (COREG) 12.5 MG tablet  empagliflozin (JARDIANCE) 10 MG TABS tablet   irbesartan (AVAPRO) 150 MG tablet    rosuvastatin (CRESTOR) 40 MG tablet    ticagrelor (BRILINTA) 90 MG TABS tablet     2. Which pharmacy/location (including street and city if local pharmacy) is medication to be sent to?  [facility]    3. Do they need a 30 day or 90 day supply? 90 days  Per pt's wife, need to change all prescription to 90 days supply

## 2022-11-13 ENCOUNTER — Telehealth (HOSPITAL_COMMUNITY): Payer: Self-pay

## 2022-11-13 ENCOUNTER — Other Ambulatory Visit (HOSPITAL_COMMUNITY): Payer: Self-pay

## 2022-11-13 MED ORDER — CARVEDILOL 12.5 MG PO TABS
12.5000 mg | ORAL_TABLET | Freq: Two times a day (BID) | ORAL | 1 refills | Status: DC
  Filled 2022-11-13 – 2022-11-18 (×2): qty 180, 90d supply, fill #0

## 2022-11-13 MED ORDER — IRBESARTAN 150 MG PO TABS
150.0000 mg | ORAL_TABLET | Freq: Every day | ORAL | 1 refills | Status: DC
  Filled 2022-11-13 – 2022-11-18 (×2): qty 90, 90d supply, fill #0

## 2022-11-13 MED ORDER — TICAGRELOR 90 MG PO TABS
90.0000 mg | ORAL_TABLET | Freq: Two times a day (BID) | ORAL | 1 refills | Status: DC
  Filled 2022-11-13 – 2022-12-08 (×2): qty 180, 90d supply, fill #0
  Filled 2023-03-24: qty 180, 90d supply, fill #1

## 2022-11-13 MED ORDER — EMPAGLIFLOZIN 10 MG PO TABS
10.0000 mg | ORAL_TABLET | Freq: Every day | ORAL | 1 refills | Status: DC
  Filled 2022-11-13 – 2022-11-18 (×2): qty 90, 90d supply, fill #0

## 2022-11-13 MED ORDER — ROSUVASTATIN CALCIUM 40 MG PO TABS
40.0000 mg | ORAL_TABLET | Freq: Every day | ORAL | 1 refills | Status: DC
  Filled 2022-11-13 – 2022-11-18 (×2): qty 90, 90d supply, fill #0
  Filled 2023-02-09: qty 90, 90d supply, fill #1

## 2022-11-13 NOTE — Telephone Encounter (Signed)
Pt called to confirm Cardiac Rehab orientation appointment tomorrow at 8am. Directions and instructions given to patient. Pt understands without assistance. Nursing Assessment completed.

## 2022-11-13 NOTE — Telephone Encounter (Signed)
Refills has been sent to the pharmacy. 

## 2022-11-14 ENCOUNTER — Encounter (HOSPITAL_COMMUNITY)
Admission: RE | Admit: 2022-11-14 | Discharge: 2022-11-14 | Disposition: A | Discharge status: Home / Self Care | Payer: 59 | Source: Ambulatory Visit | Attending: Cardiology | Admitting: Cardiology

## 2022-11-14 VITALS — BP 110/72 | HR 95 | Ht 70.0 in | Wt 206.8 lb

## 2022-11-14 DIAGNOSIS — I214 Non-ST elevation (NSTEMI) myocardial infarction: Secondary | ICD-10-CM | POA: Diagnosis present

## 2022-11-14 DIAGNOSIS — Z955 Presence of coronary angioplasty implant and graft: Secondary | ICD-10-CM | POA: Insufficient documentation

## 2022-11-14 NOTE — Progress Notes (Signed)
Cardiac Rehab Medication Review   Does the patient  feel that his/her medications are working for him/her?  yes  Has the patient been experiencing any side effects to the medications prescribed?  yes  Does the patient measure his/her own blood pressure or blood glucose at home?  yes   Does the patient have any problems obtaining medications due to transportation or finances?   no  Understanding of regimen: excellent Understanding of indications: excellent Potential of compliance: excellent    Comments: Pt understands the purpose of his medications. Pt does experience buring but is not sure which medication may be causing that. Pt checks blood sugars multiple times daily with CGM. Pt checks blood pressure daily.     Lorin Picket 11/14/2022 1:34 PM

## 2022-11-14 NOTE — Progress Notes (Signed)
Cardiac Individual Treatment Plan  Patient Details  Name: Sean Schaefer MRN: [id number] Date of Birth: Jan 12, 1966 Referring Provider:   Flowsheet Row INTENSIVE CARDIAC REHAB ORIENT from 11/14/2022 in [facility]  Referring Provider Bryan Lemma, MD       Initial Encounter Date:  Flowsheet Row INTENSIVE CARDIAC REHAB ORIENT from 11/14/2022 in [facility]  Date 11/14/22       Visit Diagnosis: 10/20/22 S/P drug eluting coronary stent placement  10/19/22 NSTEMI (non-ST elevated myocardial infarction)  Patient's Home Medications on Admission:  Current Outpatient Medications:    aspirin EC 81 MG tablet, Take 1 tablet (81 mg total) by mouth daily. Swallow whole., Disp: 30 tablet, Rfl: 1   azaTHIOprine (IMURAN) 50 MG tablet, Take 50 mg by mouth daily., Disp: , Rfl:    carvedilol (COREG) 12.5 MG tablet, Take 1 tablet (12.5 mg total) by mouth 2 (two) times daily with a meal., Disp: 180 tablet, Rfl: 1   Continuous Blood Gluc Sensor (FREESTYLE LIBRE 14 DAY SENSOR) MISC, Apply 1 sensor to the back of upper arm every 14 days, Disp: 2 each, Rfl: 3   insulin degludec (TRESIBA FLEXTOUCH) 100 UNIT/ML FlexTouch Pen, Inject 38 units into the skin once a day 90 days (Patient taking differently: Inject 38 Units into the skin at bedtime.), Disp: 3500 mL, Rfl: 3   insulin lispro (HUMALOG) 100 UNIT/ML injection, Inject 8 Units into the skin 3 (three) times daily with meals., Disp: , Rfl:    irbesartan (AVAPRO) 150 MG tablet, Take 1 tablet (150 mg total) by mouth daily., Disp: 90 tablet, Rfl: 1   nitroGLYCERIN (NITROSTAT) 0.4 MG SL tablet, Place 1 tablet (0.4 mg total) under the tongue every 5 (five) minutes as needed for chest pain., Disp: 45 tablet, Rfl: 3   rosuvastatin (CRESTOR) 40 MG tablet, Take 1 tablet (40 mg total) by mouth daily., Disp: 90 tablet, Rfl: 1   ticagrelor (BRILINTA) 90 MG  TABS tablet, Take 1 tablet (90 mg total) by mouth 2 (two) times daily., Disp: 180 tablet, Rfl: 1   empagliflozin (JARDIANCE) 10 MG TABS tablet, Take 1 tablet (10 mg total) by mouth daily., Disp: 90 tablet, Rfl: 1  Current Facility-Administered Medications:    Insulin Pen Needle (NOVOFINE) 10 each, 1 packet, Subcutaneous, QHS, Ofilia Neas, PA-C  Past Medical History: Past Medical History:  Diagnosis Date   Crohn's disease    Diabetes mellitus without complication    Hypertension    Ulcerative colitis     Tobacco Use: Social History   Tobacco Use  Smoking Status Never  Smokeless Tobacco Never    Labs: Review Flowsheet  More data exists      Latest Ref Rng & Units 12/10/2019 12/01/2020 01/30/2021 10/20/2022 10/21/2022  Labs for ITP Cardiac and Pulmonary Rehab  Cholestrol 0 - 200 mg/dL 409  - - - 811   LDL (calc) 0 - 99 mg/dL 914  - - - 62   HDL-C >78 mg/dL 29.56  - - - 50   Trlycerides <150 mg/dL 213.0  - - - 99   Hemoglobin A1c 4.8 - 5.6 % 9.4 Repeated and verified X2.  10.7  8.6  12.1  12.1     Capillary Blood Glucose: Lab Results  Component Value Date   GLUCAP 170 (H) 10/22/2022   GLUCAP 156 (H) 10/22/2022   GLUCAP 229 (H) 10/21/2022   GLUCAP 123 (  H) 10/21/2022   GLUCAP 148 (H) 10/21/2022     Exercise Target Goals: Exercise Program Goal: Individual exercise prescription set using results from initial 6 min walk test and THRR while considering  patient's activity barriers and safety.   Exercise Prescription Goal: Initial exercise prescription builds to 30-45 minutes a day of aerobic activity, 2-3 days per week.  Home exercise guidelines will be given to patient during program as part of exercise prescription that the participant will acknowledge.  Activity Barriers & Risk Stratification:  Activity Barriers & Cardiac Risk Stratification - 11/14/22 1319       Activity Barriers & Cardiac Risk Stratification   Activity Barriers Balance Concerns    Cardiac Risk  Stratification High             6 Minute Walk:  6 Minute Walk     Row Name 11/14/22 0903         6 Minute Walk   Phase Initial     Distance 1708 feet     Walk Time 6 minutes     # of Rest Breaks 0     MPH 3.23     METS 4.63     RPE 8     Perceived Dyspnea  0     VO2 Peak 16.22     Symptoms No     Resting HR 86 bpm     Resting BP 110/72     Resting Oxygen Saturation  100 %     Exercise Oxygen Saturation  during 6 min walk 100 %     Max Ex. HR 118 bpm     Max Ex. BP 138/80     2 Minute Post BP 114/70              Oxygen Initial Assessment:   Oxygen Re-Evaluation:   Oxygen Discharge (Final Oxygen Re-Evaluation):   Initial Exercise Prescription:  Initial Exercise Prescription - 11/14/22 1300       Date of Initial Exercise RX and Referring Provider   Date 11/14/22    Referring Provider Bryan Lemma, MD    Expected Discharge Date 01/25/23      Recumbant Bike   Level 2.2    RPM 60    Watts 25    Minutes 15    METs 4.6      NuStep   Level 2    SPM 80    Minutes 15    METs 4.6      Prescription Details   Frequency (times per week) 3    Duration Progress to 30 minutes of continuous aerobic without signs/symptoms of physical distress      Intensity   THRR 40-80% of Max Heartrate 66-133    Ratings of Perceived Exertion 11-13    Perceived Dyspnea 0-4      Progression   Progression Continue progressive overload as per policy without signs/symptoms or physical distress.      Resistance Training   Training Prescription Yes    Weight 4 lbs    Reps 10-15             Perform Capillary Blood Glucose checks as needed.  Exercise Prescription Changes:   Exercise Comments:   Exercise Goals and Review:   Exercise Goals     Row Name 11/14/22 1321             Exercise Goals   Increase Physical Activity Yes       Expected Outcomes Short Term: Attend rehab  on a regular basis to increase amount of physical activity.;Long Term: Add  in home exercise to make exercise part of routine and to increase amount of physical activity.;Long Term: Exercising regularly at least 3-5 days a week.       Increase Strength and Stamina Yes       Intervention Provide advice, education, support and counseling about physical activity/exercise needs.;Develop an individualized exercise prescription for aerobic and resistive training based on initial evaluation findings, risk stratification, comorbidities and participant's personal goals.       Expected Outcomes Short Term: Increase workloads from initial exercise prescription for resistance, speed, and METs.;Short Term: Perform resistance training exercises routinely during rehab and add in resistance training at home;Long Term: Improve cardiorespiratory fitness, muscular endurance and strength as measured by increased METs and functional capacity ( )       Able to understand and use rate of perceived exertion (RPE) scale Yes       Intervention Provide education and explanation on how to use RPE scale       Expected Outcomes Short Term: Able to use RPE daily in rehab to express subjective intensity level;Long Term:  Able to use RPE to guide intensity level when exercising independently       Knowledge and understanding of Target Heart Rate Range (THRR) Yes       Intervention Provide education and explanation of THRR including how the numbers were predicted and where they are located for reference       Expected Outcomes Short Term: Able to state/look up THRR;Long Term: Able to use THRR to govern intensity when exercising independently;Short Term: Able to use daily as guideline for intensity in rehab       Understanding of Exercise Prescription Yes       Intervention Provide education, explanation, and written materials on patient's individual exercise prescription       Expected Outcomes Short Term: Able to explain program exercise prescription;Long Term: Able to explain home exercise prescription to  exercise independently                Exercise Goals Re-Evaluation :   Discharge Exercise Prescription (Final Exercise Prescription Changes):   Nutrition:  Target Goals: Understanding of nutrition guidelines, daily intake of sodium 1500mg , cholesterol 200mg , calories 30% from fat and 7% or less from saturated fats, daily to have 5 or more servings of fruits and vegetables.  Biometrics:  Pre Biometrics - 11/14/22 0815       Pre Biometrics   Waist Circumference 41.5 inches    Hip Circumference 41.5 inches    Waist to Hip Ratio 1 %    Triceps Skinfold 12 mm    % Body Fat 27.4 %    Grip Strength 42 kg    Flexibility 12.5 in    Single Leg Stand 10.68 seconds              Nutrition Therapy Plan and Nutrition Goals:   Nutrition Assessments:  MEDIFICTS Score Key: ?70 Need to make dietary changes  40-70 Heart Healthy Diet ? 40 Therapeutic Level Cholesterol Diet    Picture Your Plate Scores: <16 Unhealthy dietary pattern with much room for improvement. 41-50 Dietary pattern unlikely to meet recommendations for good health and room for improvement. 51-60 More healthful dietary pattern, with some room for improvement.  >60 Healthy dietary pattern, although there may be some specific behaviors that could be improved.    Nutrition Goals Re-Evaluation:   Nutrition Goals Re-Evaluation:   Nutrition  Goals Discharge (Final Nutrition Goals Re-Evaluation):   Psychosocial: Target Goals: Acknowledge presence or absence of significant depression and/or stress, maximize coping skills, provide positive support system. Participant is able to verbalize types and ability to use techniques and skills needed for reducing stress and depression.  Initial Review & Psychosocial Screening:  Initial Psych Review & Screening - 11/14/22 1014       Initial Review   Current issues with Current Sleep Concerns   on-going for many years per patient     Family Dynamics   Good Support  System? Yes   Has sposue and brother who is an Charity fundraiser for support     Barriers   Psychosocial barriers to participate in program There are no identifiable barriers or psychosocial needs.      Screening Interventions   Interventions Encouraged to exercise             Quality of Life Scores:  Quality of Life - 11/14/22 1316       Quality of Life   Select Quality of Life      Quality of Life Scores   Health/Function Pre 27.11 %    Socioeconomic Pre 28 %    Psych/Spiritual Pre 27.86 %    Family Pre 25.8 %    GLOBAL Pre 27.84 %            Scores of 19 and below usually indicate a poorer quality of life in these areas.  A difference of  2-3 points is a clinically meaningful difference.  A difference of 2-3 points in the total score of the Quality of Life Index has been associated with significant improvement in overall quality of life, self-image, physical symptoms, and general health in studies assessing change in quality of life.  PHQ-9: Review Flowsheet  More data exists      11/14/2022 09/04/2018 08/24/2018 07/08/2018 03/10/2018  Depression screen PHQ 2/9  Decreased Interest 0 0 0 0 0  Down, Depressed, Hopeless 0 0 0 0 0  PHQ - 2 Score 0 0 0 0 0  Altered sleeping 3 - - - -  Tired, decreased energy 0 - - - -  Change in appetite 0 - - - -  Feeling bad or failure about yourself  0 - - - -  Trouble concentrating 0 - - - -  Moving slowly or fidgety/restless 0 - - - -  Suicidal thoughts 0 - - - -  PHQ-9 Score 3 - - - -  Difficult doing work/chores Not difficult at all - - - -   Interpretation of Total Score  Total Score Depression Severity:  1-4 = Minimal depression, 5-9 = Mild depression, 10-14 = Moderate depression, 15-19 = Moderately severe depression, 20-27 = Severe depression   Psychosocial Evaluation and Intervention:   Psychosocial Re-Evaluation:   Psychosocial Discharge (Final Psychosocial Re-Evaluation):   Vocational Rehabilitation: Provide vocational rehab  assistance to qualifying candidates.   Vocational Rehab Evaluation & Intervention:  Vocational Rehab - 11/14/22 1015       Initial Vocational Rehab Evaluation & Intervention   Assessment shows need for Vocational Rehabilitation No   Pt denies any needs at this time            Education: Education Goals: Education classes will be provided on a weekly basis, covering required topics. Participant will state understanding/return demonstration of topics presented.     Core Videos: Exercise    Move It!  Clinical staff conducted group or individual video education with  verbal and written material and guidebook.  Patient learns the recommended Pritikin exercise program. Exercise with the goal of living a long, healthy life. Some of the health benefits of exercise include controlled diabetes, healthier blood pressure levels, improved cholesterol levels, improved heart and lung capacity, improved sleep, and better body composition. Everyone should speak with their doctor before starting or changing an exercise routine.  Biomechanical Limitations Clinical staff conducted group or individual video education with verbal and written material and guidebook.  Patient learns how biomechanical limitations can impact exercise and how we can mitigate and possibly overcome limitations to have an impactful and balanced exercise routine.  Body Composition Clinical staff conducted group or individual video education with verbal and written material and guidebook.  Patient learns that body composition (ratio of muscle mass to fat mass) is a key component to assessing overall fitness, rather than body weight alone. Increased fat mass, especially visceral belly fat, can put [place] at increased risk for metabolic syndrome, type 2 diabetes, heart disease, and even death. It is recommended to combine diet and exercise (cardiovascular and resistance training) to improve your body composition. Seek guidance from your  physician and exercise physiologist before implementing an exercise routine.  Exercise Action Plan Clinical staff conducted group or individual video education with verbal and written material and guidebook.  Patient learns the recommended strategies to achieve and enjoy long-term exercise adherence, including variety, self-motivation, self-efficacy, and positive decision making. Benefits of exercise include fitness, good health, weight management, more energy, better sleep, less stress, and overall well-being.  Medical   Heart Disease Risk Reduction Clinical staff conducted group or individual video education with verbal and written material and guidebook.  Patient learns our heart is our most vital organ as it circulates oxygen, nutrients, white blood cells, and hormones throughout the entire body, and carries waste away. Data supports a plant-based eating plan like the Pritikin Program for its effectiveness in slowing progression of and reversing heart disease. The video provides a number of recommendations to address heart disease.   Metabolic Syndrome and Belly Fat  Clinical staff conducted group or individual video education with verbal and written material and guidebook.  Patient learns what metabolic syndrome is, how it leads to heart disease, and how one can reverse it and keep it from coming back. You have metabolic syndrome if you have 3 of the following 5 criteria: abdominal obesity, high blood pressure, high triglycerides, low HDL cholesterol, and high blood sugar.  Hypertension and Heart Disease Clinical staff conducted group or individual video education with verbal and written material and guidebook.  Patient learns that high blood pressure, or hypertension, is very common in the [place]. Hypertension is largely due to excessive salt intake, but other important risk factors include being overweight, physical inactivity, drinking too much alcohol, smoking, and not eating enough  potassium from fruits and vegetables. High blood pressure is a leading risk factor for heart attack, stroke, congestive heart failure, dementia, kidney failure, and premature death. Long-term effects of excessive salt intake include stiffening of the arteries and thickening of heart muscle and organ damage. Recommendations include ways to reduce hypertension and the risk of heart disease.  Diseases of Our Time - Focusing on Diabetes Clinical staff conducted group or individual video education with verbal and written material and guidebook.  Patient learns why the best way to stop diseases of our time is prevention, through food and other lifestyle changes. Medicine (such as prescription pills and surgeries) is often only  a Band-Aid on the problem, not a long-term solution. Most common diseases of our time include obesity, type 2 diabetes, hypertension, heart disease, and cancer. The Pritikin Program is recommended and has been proven to help reduce, reverse, and/or prevent the damaging effects of metabolic syndrome.  Nutrition   Overview of the Pritikin Eating Plan  Clinical staff conducted group or individual video education with verbal and written material and guidebook.  Patient learns about the Pritikin Eating Plan for disease risk reduction. The Pritikin Eating Plan emphasizes a wide variety of unrefined, minimally-processed carbohydrates, like fruits, vegetables, whole grains, and legumes. Go, Caution, and Stop food choices are explained. Plant-based and lean animal proteins are emphasized. Rationale provided for low sodium intake for blood pressure control, low added sugars for blood sugar stabilization, and low added fats and oils for coronary artery disease risk reduction and weight management.  Calorie Density  Clinical staff conducted group or individual video education with verbal and written material and guidebook.  Patient learns about calorie density and how it impacts the Pritikin Eating  Plan. Knowing the characteristics of the food you choose will help you decide whether those foods will lead to weight gain or weight loss, and whether you want to consume more or less of them. Weight loss is usually a side effect of the Pritikin Eating Plan because of its focus on low calorie-dense foods.  Label Reading  Clinical staff conducted group or individual video education with verbal and written material and guidebook.  Patient learns about the Pritikin recommended label reading guidelines and corresponding recommendations regarding calorie density, added sugars, sodium content, and whole grains.  Dining Out - Part 1  Clinical staff conducted group or individual video education with verbal and written material and guidebook.  Patient learns that restaurant meals can be sabotaging because they can be so high in calories, fat, sodium, and/or sugar. Patient learns recommended strategies on how to positively address this and avoid unhealthy pitfalls.  Facts on Fats  Clinical staff conducted group or individual video education with verbal and written material and guidebook.  Patient learns that lifestyle modifications can be just as effective, if not more so, as many medications for lowering your risk of heart disease. A Pritikin lifestyle can help to reduce your risk of inflammation and atherosclerosis (cholesterol build-up, or plaque, in the artery walls). Lifestyle interventions such as dietary choices and physical activity address the cause of atherosclerosis. A review of the types of fats and their impact on blood cholesterol levels, along with dietary recommendations to reduce fat intake is also included.  Nutrition Action Plan  Clinical staff conducted group or individual video education with verbal and written material and guidebook.  Patient learns how to incorporate Pritikin recommendations into their lifestyle. Recommendations include planning and keeping personal health goals in mind  as an important part of their success.  Healthy Mind-Set    Healthy Minds, Bodies, Hearts  Clinical staff conducted group or individual video education with verbal and written material and guidebook.  Patient learns how to identify when they are stressed. Video will discuss the impact of that stress, as well as the many benefits of stress management. Patient will also be introduced to stress management techniques. The way we think, act, and feel has an impact on our hearts.  How Our Thoughts Can Heal Our Hearts  Clinical staff conducted group or individual video education with verbal and written material and guidebook.  Patient learns that negative thoughts can cause depression and  anxiety. This can result in negative lifestyle behavior and serious health problems. Cognitive behavioral therapy is an effective method to help control our thoughts in order to change and improve our emotional outlook.  Additional Videos:  Exercise    Improving Performance  Clinical staff conducted group or individual video education with verbal and written material and guidebook.  Patient learns to use a non-linear approach by alternating intensity levels and lengths of time spent exercising to help burn more calories and lose more body fat. Cardiovascular exercise helps improve heart health, metabolism, hormonal balance, blood sugar control, and recovery from fatigue. Resistance training improves strength, endurance, balance, coordination, reaction time, metabolism, and muscle mass. Flexibility exercise improves circulation, posture, and balance. Seek guidance from your physician and exercise physiologist before implementing an exercise routine and learn your capabilities and proper form for all exercise.  Introduction to Yoga  Clinical staff conducted group or individual video education with verbal and written material and guidebook.  Patient learns about yoga, a discipline of the coming together of mind, breath,  and body. The benefits of yoga include improved flexibility, improved range of motion, better posture and core strength, increased lung function, weight loss, and positive self-image. Yoga's heart health benefits include lowered blood pressure, healthier heart rate, decreased cholesterol and triglyceride levels, improved immune function, and reduced stress. Seek guidance from your physician and exercise physiologist before implementing an exercise routine and learn your capabilities and proper form for all exercise.  Medical   Aging: Enhancing Your Quality of Life  Clinical staff conducted group or individual video education with verbal and written material and guidebook.  Patient learns key strategies and recommendations to stay in good physical health and enhance quality of life, such as prevention strategies, having an advocate, securing a Health Care Proxy and Power of Attorney, and keeping a list of medications and system for tracking them. It also discusses how to avoid risk for bone loss.  Biology of Weight Control  Clinical staff conducted group or individual video education with verbal and written material and guidebook.  Patient learns that weight gain occurs because we consume more calories than we burn (eating more, moving less). Even if your body weight is normal, you may have higher ratios of fat compared to muscle mass. Too much body fat puts you at increased risk for cardiovascular disease, heart attack, stroke, type 2 diabetes, and obesity-related cancers. In addition to exercise, following the Pritikin Eating Plan can help reduce your risk.  Decoding Lab Results  Clinical staff conducted group or individual video education with verbal and written material and guidebook.  Patient learns that lab test reflects one measurement whose values change over time and are influenced by many factors, including medication, stress, sleep, exercise, food, hydration, pre-existing medical conditions,  and more. It is recommended to use the knowledge from this video to become more involved with your lab results and evaluate your numbers to speak with your doctor.   Diseases of Our Time - Overview  Clinical staff conducted group or individual video education with verbal and written material and guidebook.  Patient learns that according to the CDC, 50% to 70% of chronic diseases (such as obesity, type 2 diabetes, elevated lipids, hypertension, and heart disease) are avoidable through lifestyle improvements including healthier food choices, listening to satiety cues, and increased physical activity.  Sleep Disorders Clinical staff conducted group or individual video education with verbal and written material and guidebook.  Patient learns how good quality and duration of  sleep are important to overall health and well-being. Patient also learns about sleep disorders and how they impact health along with recommendations to address them, including discussing with a physician.  Nutrition  Dining Out - Part 2 Clinical staff conducted group or individual video education with verbal and written material and guidebook.  Patient learns how to plan ahead and communicate in order to maximize their dining experience in a healthy and nutritious manner. Included are recommended food choices based on the type of restaurant the patient is visiting.   Fueling a Banker conducted group or individual video education with verbal and written material and guidebook.  There is a strong connection between our food choices and our health. Diseases like obesity and type 2 diabetes are very prevalent and are in large-part due to lifestyle choices. The Pritikin Eating Plan provides plenty of food and hunger-curbing satisfaction. It is easy to follow, affordable, and helps reduce health risks.  Menu Workshop  Clinical staff conducted group or individual video education with verbal and written material  and guidebook.  Patient learns that restaurant meals can sabotage health goals because they are often packed with calories, fat, sodium, and sugar. Recommendations include strategies to plan ahead and to communicate with the manager, chef, or server to help order a healthier meal.  Planning Your Eating Strategy  Clinical staff conducted group or individual video education with verbal and written material and guidebook.  Patient learns about the Pritikin Eating Plan and its benefit of reducing the risk of disease. The Pritikin Eating Plan does not focus on calories. Instead, it emphasizes high-quality, nutrient-rich foods. By knowing the characteristics of the foods, we choose, we can determine their calorie density and make informed decisions.  Targeting Your Nutrition Priorities  Clinical staff conducted group or individual video education with verbal and written material and guidebook.  Patient learns that lifestyle habits have a tremendous impact on disease risk and progression. This video provides eating and physical activity recommendations based on your personal health goals, such as reducing LDL cholesterol, losing weight, preventing or controlling type 2 diabetes, and reducing high blood pressure.  Vitamins and Minerals  Clinical staff conducted group or individual video education with verbal and written material and guidebook.  Patient learns different ways to obtain key vitamins and minerals, including through a recommended healthy diet. It is important to discuss all supplements you take with your doctor.   Healthy Mind-Set    Smoking Cessation  Clinical staff conducted group or individual video education with verbal and written material and guidebook.  Patient learns that cigarette smoking and tobacco addiction pose a serious health risk which affects millions of people. Stopping smoking will significantly reduce the risk of heart disease, lung disease, and many forms of cancer.  Recommended strategies for quitting are covered, including working with your doctor to develop a successful plan.  Culinary   Becoming a Set designer conducted group or individual video education with verbal and written material and guidebook.  Patient learns that cooking at home can be healthy, cost-effective, quick, and puts them in control. Keys to cooking healthy recipes will include looking at your recipe, assessing your equipment needs, planning ahead, making it simple, choosing cost-effective seasonal ingredients, and limiting the use of added fats, salts, and sugars.  Cooking - Breakfast and Snacks  Clinical staff conducted group or individual video education with verbal and written material and guidebook.  Patient learns how important breakfast is to satiety  and nutrition through the entire day. Recommendations include key foods to eat during breakfast to help stabilize blood sugar levels and to prevent overeating at meals later in the day. Planning ahead is also a key component.  Cooking - Educational psychologist conducted group or individual video education with verbal and written material and guidebook.  Patient learns eating strategies to improve overall health, including an approach to cook more at home. Recommendations include thinking of animal protein as a side on your plate rather than center stage and focusing instead on lower calorie dense options like vegetables, fruits, whole grains, and plant-based proteins, such as beans. Making sauces in large quantities to freeze for later and leaving the skin on your vegetables are also recommended to maximize your experience.  Cooking - Healthy Salads and Dressing Clinical staff conducted group or individual video education with verbal and written material and guidebook.  Patient learns that vegetables, fruits, whole grains, and legumes are the foundations of the Pritikin Eating Plan. Recommendations include how  to incorporate each of these in flavorful and healthy salads, and how to create homemade salad dressings. Proper handling of ingredients is also covered. Cooking - Soups and [employer] - Soups and Desserts Clinical staff conducted group or individual video education with verbal and written material and guidebook.  Patient learns that Pritikin soups and desserts make for easy, nutritious, and delicious snacks and meal components that are low in sodium, fat, sugar, and calorie density, while high in vitamins, minerals, and filling fiber. Recommendations include simple and healthy ideas for soups and desserts.   Overview     The Pritikin Solution Program Overview Clinical staff conducted group or individual video education with verbal and written material and guidebook.  Patient learns that the results of the Pritikin Program have been documented in more than 100 articles published in peer-reviewed journals, and the benefits include reducing risk factors for (and, in some cases, even reversing) high cholesterol, high blood pressure, type 2 diabetes, obesity, and more! An overview of the three key pillars of the Pritikin Program will be covered: eating well, doing regular exercise, and having a healthy mind-set.  WORKSHOPS  Exercise: Exercise Basics: Building Your Action Plan Clinical staff led group instruction and group discussion with PowerPoint presentation and patient guidebook. To enhance the learning environment the use of posters, models and videos may be added. At the conclusion of this workshop, patients will comprehend the difference between physical activity and exercise, as well as the benefits of incorporating both, into their routine. Patients will understand the FITT (Frequency, Intensity, Time, and Type) principle and how to use it to build an exercise action plan. In addition, safety concerns and other considerations for exercise and cardiac rehab will be addressed by the  presenter. The purpose of this lesson is to promote a comprehensive and effective weekly exercise routine in order to improve patients' overall level of fitness.   Managing Heart Disease: Your Path to a Healthier Heart Clinical staff led group instruction and group discussion with PowerPoint presentation and patient guidebook. To enhance the learning environment the use of posters, models and videos may be added.At the conclusion of this workshop, patients will understand the anatomy and physiology of the heart. Additionally, they will understand how Pritikin's three pillars impact the risk factors, the progression, and the management of heart disease.  The purpose of this lesson is to provide a high-level overview of the heart, heart disease, and how the Pritikin lifestyle  positively impacts risk factors.  Exercise Biomechanics Clinical staff led group instruction and group discussion with PowerPoint presentation and patient guidebook. To enhance the learning environment the use of posters, models and videos may be added. Patients will learn how the structural parts of their bodies function and how these functions impact their daily activities, movement, and exercise. Patients will learn how to promote a neutral spine, learn how to manage pain, and identify ways to improve their physical movement in order to promote healthy living. The purpose of this lesson is to expose patients to common physical limitations that impact physical activity. Participants will learn practical ways to adapt and manage aches and pains, and to minimize their effect on regular exercise. Patients will learn how to maintain good posture while sitting, walking, and lifting.  Balance Training and Fall Prevention  Clinical staff led group instruction and group discussion with PowerPoint presentation and patient guidebook. To enhance the learning environment the use of posters, models and videos may be added. At the  conclusion of this workshop, patients will understand the importance of their sensorimotor skills (vision, proprioception, and the vestibular system) in maintaining their ability to balance as they age. Patients will apply a variety of balancing exercises that are appropriate for their current level of function. Patients will understand the common causes for poor balance, possible solutions to these problems, and ways to modify their physical environment in order to minimize their fall risk. The purpose of this lesson is to teach patients about the importance of maintaining balance as they age and ways to minimize their risk of falling.  WORKSHOPS   Nutrition:  Fueling a Ship broker led group instruction and group discussion with PowerPoint presentation and patient guidebook. To enhance the learning environment the use of posters, models and videos may be added. Patients will review the foundational principles of the Pritikin Eating Plan and understand what constitutes a serving size in each of the food groups. Patients will also learn Pritikin-friendly foods that are better choices when away from home and review make-ahead meal and snack options. Calorie density will be reviewed and applied to three nutrition priorities: weight maintenance, weight loss, and weight gain. The purpose of this lesson is to reinforce (in a group setting) the key concepts around what patients are recommended to eat and how to apply these guidelines when away from home by planning and selecting Pritikin-friendly options. Patients will understand how calorie density may be adjusted for different weight management goals.  Mindful Eating  Clinical staff led group instruction and group discussion with PowerPoint presentation and patient guidebook. To enhance the learning environment the use of posters, models and videos may be added. Patients will briefly review the concepts of the Pritikin Eating Plan and the  importance of low-calorie dense foods. The concept of mindful eating will be introduced as well as the importance of paying attention to internal hunger signals. Triggers for non-hunger eating and techniques for dealing with triggers will be explored. The purpose of this lesson is to provide patients with the opportunity to review the basic principles of the Pritikin Eating Plan, discuss the value of eating mindfully and how to measure internal cues of hunger and fullness using the Hunger Scale. Patients will also discuss reasons for non-hunger eating and learn strategies to use for controlling emotional eating.  Targeting Your Nutrition Priorities Clinical staff led group instruction and group discussion with PowerPoint presentation and patient guidebook. To enhance the learning environment the use of posters,  models and videos may be added. Patients will learn how to determine their genetic susceptibility to disease by reviewing their family history. Patients will gain insight into the importance of diet as part of an overall healthy lifestyle in mitigating the impact of genetics and other environmental insults. The purpose of this lesson is to provide patients with the opportunity to assess their personal nutrition priorities by looking at their family history, their own health history and current risk factors. Patients will also be able to discuss ways of prioritizing and modifying the Pritikin Eating Plan for their highest risk areas  Menu  Clinical staff led group instruction and group discussion with PowerPoint presentation and patient guidebook. To enhance the learning environment the use of posters, models and videos may be added. Using menus brought in from [employer], or printed from [employer], patients will apply the Pritikin dining out guidelines that were presented in the [employer] video. Patients will also be able to practice these guidelines in a variety of  provided scenarios. The purpose of this lesson is to provide patients with the opportunity to practice hands-on learning of the Pritikin Dining Out guidelines with actual menus and practice scenarios.  Label Reading Clinical staff led group instruction and group discussion with PowerPoint presentation and patient guidebook. To enhance the learning environment the use of posters, models and videos may be added. Patients will review and discuss the Pritikin label reading guidelines presented in Pritikin's Label Reading Educational series video. Using fool labels brought in from local grocery stores and markets, patients will apply the label reading guidelines and determine if the packaged food meet the Pritikin guidelines. The purpose of this lesson is to provide patients with the opportunity to review, discuss, and practice hands-on learning of the Pritikin Label Reading guidelines with actual packaged food labels. Cooking School  Pritikin's [employer] are designed to teach patients ways to prepare quick, simple, and affordable recipes at home. The importance of nutrition's role in chronic disease risk reduction is reflected in its emphasis in the overall Pritikin program. By learning how to prepare essential core Pritikin Eating Plan recipes, patients will increase control over what they eat; be able to customize the flavor of foods without the use of added salt, sugar, or fat; and improve the quality of the food they consume. By learning a set of core recipes which are easily assembled, quickly prepared, and affordable, patients are more likely to prepare more healthy foods at home. These workshops focus on convenient breakfasts, simple entres, side dishes, and desserts which can be prepared with minimal effort and are consistent with nutrition recommendations for cardiovascular risk reduction. Cooking [employer] are taught by a Armed forces logistics/support/administrative officer (RD) who has been trained by the  [employer]. The chef or RD has a clear understanding of the importance of minimizing - if not completely eliminating - added fat, sugar, and sodium in recipes. Throughout the series of Cooking School Workshop sessions, patients will learn about healthy ingredients and efficient methods of cooking to build confidence in their capability to prepare    Cooking School weekly topics:  Adding Flavor- Sodium-Free  Fast and Healthy Breakfasts  Powerhouse Plant-Based Proteins  Satisfying Salads and Dressings  Simple Sides and Sauces  International Cuisine-Spotlight on the [employer] Zones  Delicious Desserts  Savory Soups  [employer] - Meals in a Astronomer Appetizers and Snacks  Comforting Weekend Breakfasts  One-Pot Wonders   Fast Evening  Meals  Easy Entertaining  Personalizing Your Pritikin Plate  WORKSHOPS   Healthy Mindset (Psychosocial):  Focused Goals, Sustainable Changes Clinical staff led group instruction and group discussion with PowerPoint presentation and patient guidebook. To enhance the learning environment the use of posters, models and videos may be added. Patients will be able to apply effective goal setting strategies to establish at least one personal goal, and then take consistent, meaningful action toward that goal. They will learn to identify common barriers to achieving personal goals and develop strategies to overcome them. Patients will also gain an understanding of how our mind-set can impact our ability to achieve goals and the importance of cultivating a positive and growth-oriented mind-set. The purpose of this lesson is to provide patients with a deeper understanding of how to set and achieve personal goals, as well as the tools and strategies needed to overcome common obstacles which may arise along the way.  From Head to Heart: The Power of a Healthy Outlook  Clinical staff led group instruction and group discussion with PowerPoint presentation  and patient guidebook. To enhance the learning environment the use of posters, models and videos may be added. Patients will be able to recognize and describe the impact of emotions and mood on physical health. They will discover the importance of self-care and explore self-care practices which may work for them. Patients will also learn how to utilize the 4 C's to cultivate a healthier outlook and better manage stress and challenges. The purpose of this lesson is to demonstrate to patients how a healthy outlook is an essential part of maintaining good health, especially as they continue their cardiac rehab journey.  Healthy Sleep for a Healthy Heart Clinical staff led group instruction and group discussion with PowerPoint presentation and patient guidebook. To enhance the learning environment the use of posters, models and videos may be added. At the conclusion of this workshop, patients will be able to demonstrate knowledge of the importance of sleep to overall health, well-being, and quality of life. They will understand the symptoms of, and treatments for, common sleep disorders. Patients will also be able to identify daytime and nighttime behaviors which impact sleep, and they will be able to apply these tools to help manage sleep-related challenges. The purpose of this lesson is to provide patients with a general overview of sleep and outline the importance of quality sleep. Patients will learn about a few of the most common sleep disorders. Patients will also be introduced to the concept of "sleep hygiene," and discover ways to self-manage certain sleeping problems through simple daily behavior changes. Finally, the workshop will motivate patients by clarifying the links between quality sleep and their goals of heart-healthy living.   Recognizing and Reducing Stress Clinical staff led group instruction and group discussion with PowerPoint presentation and patient guidebook. To enhance the learning  environment the use of posters, models and videos may be added. At the conclusion of this workshop, patients will be able to understand the types of stress reactions, differentiate between acute and chronic stress, and recognize the impact that chronic stress has on their health. They will also be able to apply different coping mechanisms, such as reframing negative self-talk. Patients will have the opportunity to practice a variety of stress management techniques, such as deep abdominal breathing, progressive muscle relaxation, and/or guided imagery.  The purpose of this lesson is to educate patients on the role of stress in their lives and to provide healthy techniques for coping with it.  Learning Barriers/Preferences:  Learning Barriers/Preferences - 11/14/22 1017       Learning Barriers/Preferences   Learning Barriers None    Learning Preferences Audio;Computer/Internet;Group Instruction;Individual Instruction;Pictoral;Skilled Demonstration;Verbal Instruction;Video;Written Material             Education Topics:  Knowledge Questionnaire Score:  Knowledge Questionnaire Score - 11/14/22 1017       Knowledge Questionnaire Score   Pre Score 20/24             Core Components/Risk Factors/Patient Goals at Admission:  Personal Goals and Risk Factors at Admission - 11/14/22 1015       Core Components/Risk Factors/Patient Goals on Admission    Weight Management Yes;Weight Loss    Intervention Weight Management: Develop a combined nutrition and exercise program designed to reach desired caloric intake, while maintaining appropriate intake of nutrient and fiber, sodium and fats, and appropriate energy expenditure required for the weight goal.;Weight Management: Provide education and appropriate resources to help participant work on and attain dietary goals.;Weight Management/Obesity: Establish reasonable short term and long term weight goals.    Admit Weight 206 lb 12.7 oz (93.8 kg)     Goal Weight: Long Term 190 lb (86.2 kg)   Pt goal   Expected Outcomes Short Term: Continue to assess and modify interventions until short term weight is achieved;Long Term: Adherence to nutrition and physical activity/exercise program aimed toward attainment of established weight goal;Weight Loss: Understanding of general recommendations for a balanced deficit meal plan, which promotes 1-2 lb weight loss per week and includes a negative energy balance of [phone] kcal/d;Understanding recommendations for meals to include 15-35% energy as protein, 25-35% energy from fat, 35-60% energy from carbohydrates, less than 200mg  of dietary cholesterol, 20-35 gm of total fiber daily;Understanding of distribution of calorie intake throughout the day with the consumption of 4-5 meals/snacks    Diabetes Yes    Intervention Provide education about signs/symptoms and action to take for hypo/hyperglycemia.;Provide education about proper nutrition, including hydration, and aerobic/resistive exercise prescription along with prescribed medications to achieve blood glucose in normal ranges: Fasting glucose 65-99 mg/dL    Expected Outcomes Short Term: Participant verbalizes understanding of the signs/symptoms and immediate care of hyper/hypoglycemia, proper foot care and importance of medication, aerobic/resistive exercise and nutrition plan for blood glucose control.;Long Term: Attainment of HbA1C < 7%.    Hypertension Yes    Intervention Provide education on lifestyle modifcations including regular physical activity/exercise, weight management, moderate sodium restriction and increased consumption of fresh fruit, vegetables, and low fat dairy, alcohol moderation, and smoking cessation.;Monitor prescription use compliance.    Expected Outcomes Short Term: Continued assessment and intervention until BP is < 140/31mm HG in hypertensive participants. < 130/60mm HG in hypertensive participants with diabetes, heart failure or  chronic kidney disease.;Long Term: Maintenance of blood pressure at goal levels.    Lipids Yes    Intervention Provide education and support for participant on nutrition & aerobic/resistive exercise along with prescribed medications to achieve LDL 70mg , HDL >40mg .    Expected Outcomes Short Term: Participant states understanding of desired cholesterol values and is compliant with medications prescribed. Participant is following exercise prescription and nutrition guidelines.;Long Term: Cholesterol controlled with medications as prescribed, with individualized exercise RX and with personalized nutrition plan. Value goals: LDL < 70mg , HDL > 40 mg.             Core Components/Risk Factors/Patient Goals Review:    Core Components/Risk Factors/Patient Goals at Discharge (Final Review):    ITP Comments:  ITP Comments     Row Name 11/14/22 0806           ITP Comments Armanda Magic, MD: Medical Director. Introduction to pritikin education program/ intensive cardiac rehab. Initial orientation packet reviewed with patient.                Comments: Participant attended orientation for the cardiac rehabilitation program on  11/14/2022  to perform initial intake and exercise walk test. Patient introduced to the Pritikin Program education and orientation packet was reviewed. Completed 6-minute walk test, measurements, initial ITP, and exercise prescription. Vital signs stable. Telemetry-normal sinus rhythm, asymptomatic.   Service time was from 8:00 to 9:47.

## 2022-11-18 ENCOUNTER — Other Ambulatory Visit (HOSPITAL_COMMUNITY): Payer: Self-pay

## 2022-11-18 ENCOUNTER — Encounter (HOSPITAL_COMMUNITY)
Admission: RE | Admit: 2022-11-18 | Discharge: 2022-11-18 | Disposition: A | Discharge status: Home / Self Care | Payer: 59 | Source: Ambulatory Visit | Attending: Cardiology | Admitting: Cardiology

## 2022-11-18 DIAGNOSIS — I214 Non-ST elevation (NSTEMI) myocardial infarction: Secondary | ICD-10-CM

## 2022-11-18 DIAGNOSIS — Z955 Presence of coronary angioplasty implant and graft: Secondary | ICD-10-CM | POA: Diagnosis not present

## 2022-11-18 LAB — GLUCOSE, CAPILLARY
Glucose-Capillary: 248 mg/dL — ABNORMAL HIGH (ref 70–99)
Glucose-Capillary: 238 mg/dL — ABNORMAL HIGH (ref 70–99)

## 2022-11-18 NOTE — Progress Notes (Signed)
Daily Session Note  Patient Details  Name: Sean Schaefer MRN: [id number] Date of Birth: February 19, 1966 Referring Provider:   Flowsheet Row INTENSIVE CARDIAC REHAB ORIENT from 11/14/2022 in [facility]  Referring Provider Bryan Lemma, MD       Encounter Date: 11/18/2022  Check In:  Session Check In - 11/18/22 0830       Check-In   Supervising physician immediately available to respond to emergencies [facility] MD immediately available    Physician(s) Robin Searing, NP    Location [facility]    Staff Present Lorin Picket, MS, ACSM-CEP, CCRP, Exercise Physiologist;Kaylee Earlene Plater, MS, ACSM-CEP, Exercise Physiologist;Tequlia Gonsalves, RN, Zachery Conch, MS, ACSM-CEP, Exercise Physiologist;Mary Gerre Scull, RN, Fuller Plan, RT    Virtual Visit No    Medication changes reported     No    Fall or balance concerns reported    No    Tobacco Cessation No Change    Warm-up and Cool-down Performed as group-led instruction    Resistance Training Performed Yes    VAD Patient? No    PAD/SET Patient? No      Pain Assessment   Currently in Pain? No/denies    Pain Score 0-No pain    Multiple Pain Sites No             Capillary Blood Glucose: Results for orders placed or performed during the hospital encounter of 11/18/22 (from the past 24 hour(s))  Glucose, capillary     Status: Abnormal   Collection Time: 11/18/22  9:06 AM  Result Value Ref Range   Glucose-Capillary 248 (H) 70 - 99 mg/dL  Glucose, capillary     Status: Abnormal   Collection Time: 11/18/22  9:53 AM  Result Value Ref Range   Glucose-Capillary 238 (H) 70 - 99 mg/dL     Exercise Prescription Changes - 11/18/22 1200       Response to Exercise   Blood Pressure (Admit) 114/74    Blood Pressure (Exercise) 152/84    Blood Pressure (Exit) 107/71    Heart Rate (Admit) 95 bpm    Heart Rate (Exercise) 117 bpm    Heart Rate (Exit) 98 bpm    Rating of  Perceived Exertion (Exercise) 13    Symptoms Fatigue    Comments Pt's first day in the CRP2 program    Duration Progress to 30 minutes of  aerobic without signs/symptoms of physical distress    Intensity THRR unchanged      Progression   Progression Continue to progress workloads to maintain intensity without signs/symptoms of physical distress.    Average METs 2.1      Resistance Training   Training Prescription Yes    Weight 4 lbs    Reps 10-15    Time 10 Minutes      Interval Training   Interval Training No      Recumbant Bike   Level 2    RPM 61    Watts 33    Minutes 15    METs 2.2      NuStep   Level 1   reduced from level 2  to level 1 due to fatigue   SPM 68    Minutes 10    METs 2             Social History   Tobacco Use  Smoking Status Never  Smokeless Tobacco Never    Goals Met:  Exercise tolerated well  No report of concerns or symptoms today Strength training completed today  Goals Unmet:  Not Applicable  Comments: Pt started cardiac rehab today.  Pt tolerated light exercise without difficulty. VSS, telemetry-Sinus Rhythm, asymptomatic.  Medication list reconciled. Pt denies barriers to medicaiton compliance.  PSYCHOSOCIAL ASSESSMENT:  PHQ-3. Pt exhibits positive coping skills, hopeful outlook with supportive family. No psychosocial needs identified at this time, no psychosocial interventions necessary.    Pt enjoys cars, cooking, playing the sax and going to church.   Pt oriented to exercise equipment and routine.    Understanding verbalized. Thayer Headings RN BSN    Dr. Armanda Magic is Medical Director for Cardiac Rehab at [facility]

## 2022-11-19 ENCOUNTER — Other Ambulatory Visit (HOSPITAL_COMMUNITY): Payer: Self-pay

## 2022-11-19 ENCOUNTER — Telehealth: Payer: Self-pay | Admitting: *Deleted

## 2022-11-19 NOTE — Telephone Encounter (Signed)
-----   Message from Marykay Lex, MD sent at 11/18/2022  7:07 PM EDT ----- Needs to discuss with PCP that his blood sugars been quite elevated and cardiac rehab.  May need more aggressive glycemic control.  Bryan Lemma, MD

## 2022-11-19 NOTE — Telephone Encounter (Signed)
Per DPR . Left message on voicemail with instruction to contact PCP-   Any question may call back Forward result to Dr Nehemiah Settle

## 2022-11-20 ENCOUNTER — Encounter (HOSPITAL_COMMUNITY)
Admission: RE | Admit: 2022-11-20 | Discharge: 2022-11-20 | Disposition: A | Discharge status: Home / Self Care | Payer: 59 | Source: Ambulatory Visit | Attending: Cardiology | Admitting: Cardiology

## 2022-11-20 DIAGNOSIS — Z955 Presence of coronary angioplasty implant and graft: Secondary | ICD-10-CM | POA: Insufficient documentation

## 2022-11-20 DIAGNOSIS — I214 Non-ST elevation (NSTEMI) myocardial infarction: Secondary | ICD-10-CM | POA: Insufficient documentation

## 2022-11-22 ENCOUNTER — Encounter (HOSPITAL_COMMUNITY)
Admission: RE | Admit: 2022-11-22 | Discharge: 2022-11-22 | Disposition: A | Discharge status: Home / Self Care | Payer: 59 | Source: Ambulatory Visit | Attending: Cardiology | Admitting: Cardiology

## 2022-11-22 ENCOUNTER — Other Ambulatory Visit (HOSPITAL_COMMUNITY): Payer: Self-pay

## 2022-11-22 DIAGNOSIS — I214 Non-ST elevation (NSTEMI) myocardial infarction: Secondary | ICD-10-CM | POA: Diagnosis not present

## 2022-11-22 DIAGNOSIS — Z955 Presence of coronary angioplasty implant and graft: Secondary | ICD-10-CM

## 2022-11-25 ENCOUNTER — Encounter (HOSPITAL_COMMUNITY)
Admission: RE | Admit: 2022-11-25 | Discharge: 2022-11-25 | Disposition: A | Discharge status: Home / Self Care | Payer: 59 | Source: Ambulatory Visit | Attending: Cardiology | Admitting: Cardiology

## 2022-11-25 DIAGNOSIS — I214 Non-ST elevation (NSTEMI) myocardial infarction: Secondary | ICD-10-CM

## 2022-11-25 DIAGNOSIS — Z955 Presence of coronary angioplasty implant and graft: Secondary | ICD-10-CM

## 2022-11-26 DIAGNOSIS — E113293 Type 2 diabetes mellitus with mild nonproliferative diabetic retinopathy without macular edema, bilateral: Secondary | ICD-10-CM | POA: Diagnosis not present

## 2022-11-27 ENCOUNTER — Encounter (HOSPITAL_COMMUNITY)
Admission: RE | Admit: 2022-11-27 | Discharge: 2022-11-27 | Disposition: A | Discharge status: Home / Self Care | Payer: 59 | Source: Ambulatory Visit | Attending: Cardiology | Admitting: Cardiology

## 2022-11-27 DIAGNOSIS — Z955 Presence of coronary angioplasty implant and graft: Secondary | ICD-10-CM

## 2022-11-27 DIAGNOSIS — I214 Non-ST elevation (NSTEMI) myocardial infarction: Secondary | ICD-10-CM

## 2022-11-27 LAB — GLUCOSE, CAPILLARY: Glucose-Capillary: 192 mg/dL — ABNORMAL HIGH (ref 70–99)

## 2022-11-27 NOTE — Progress Notes (Signed)
Cardiac Individual Treatment Plan  Patient Details  Name: Sean Schaefer MRN: [id number] Date of Birth: January 01, 1966 Referring Provider:   Flowsheet Row INTENSIVE CARDIAC REHAB ORIENT from 11/14/2022 in [facility] for Heart, Vascular, & Lung Health  Referring Provider Bryan Lemma, MD       Initial Encounter Date:  Flowsheet Row INTENSIVE CARDIAC REHAB ORIENT from 11/14/2022 in [facility] for Heart, Vascular, & Lung Health  Date 11/14/22       Visit Diagnosis: 10/19/22 NSTEMI (non-ST elevated myocardial infarction)  10/20/22 S/P drug eluting coronary stent placement  Patient's Home Medications on Admission:  Current Outpatient Medications:    aspirin EC 81 MG tablet, Take 1 tablet (81 mg total) by mouth daily. Swallow whole., Disp: 30 tablet, Rfl: 1   azaTHIOprine (IMURAN) 50 MG tablet, Take 50 mg by mouth daily., Disp: , Rfl:    carvedilol (COREG) 12.5 MG tablet, Take 1 tablet (12.5 mg total) by mouth 2 (two) times daily with a meal., Disp: 180 tablet, Rfl: 1   Continuous Glucose Sensor (FREESTYLE LIBRE 14 DAY SENSOR) MISC, Apply 1 sensor to the back of upper arm every 14 days, Disp: 2 each, Rfl: 3   empagliflozin (JARDIANCE) 10 MG TABS tablet, Take 1 tablet (10 mg total) by mouth daily., Disp: 90 tablet, Rfl: 1   insulin degludec (TRESIBA FLEXTOUCH) 100 UNIT/ML FlexTouch Pen, Inject 38 units into the skin once a day 90 days (Patient taking differently: Inject 38 Units into the skin at bedtime.), Disp: 3500 mL, Rfl: 3   insulin lispro (HUMALOG) 100 UNIT/ML injection, Inject 8 Units into the skin 3 (three) times daily with meals., Disp: , Rfl:    irbesartan (AVAPRO) 150 MG tablet, Take 1 tablet (150 mg total) by mouth daily., Disp: 90 tablet, Rfl: 1   nitroGLYCERIN (NITROSTAT) 0.4 MG SL tablet, Place 1 tablet (0.4 mg total) under the tongue every 5 (five) minutes as needed for chest pain., Disp: 45 tablet, Rfl: 3   rosuvastatin (CRESTOR)  40 MG tablet, Take 1 tablet (40 mg total) by mouth daily., Disp: 90 tablet, Rfl: 1   ticagrelor (BRILINTA) 90 MG TABS tablet, Take 1 tablet (90 mg total) by mouth 2 (two) times daily., Disp: 180 tablet, Rfl: 1  Current Facility-Administered Medications:    Insulin Pen Needle (NOVOFINE) 10 each, 1 packet, Subcutaneous, QHS, Ofilia Neas, PA-C  Past Medical History: Past Medical History:  Diagnosis Date   Crohn's disease (HCC)    Diabetes mellitus without complication (HCC)    Hypertension    Ulcerative colitis (HCC)     Tobacco Use: Social History   Tobacco Use  Smoking Status Never  Smokeless Tobacco Never    Labs: Review Flowsheet  More data exists      Latest Ref Rng & Units 12/10/2019 12/01/2020 01/30/2021 10/20/2022 10/21/2022  Labs for ITP Cardiac and Pulmonary Rehab  Cholestrol 0 - 200 mg/dL 409  - - - 811   LDL (calc) 0 - 99 mg/dL 914  - - - 62   HDL-C >78 mg/dL 29.56  - - - 50   Trlycerides <150 mg/dL 213.0  - - - 99   Hemoglobin A1c 4.8 - 5.6 % 9.4 Repeated and verified X2.  10.7  8.6  12.1  12.1     Capillary Blood Glucose: Lab Results  Component Value Date   GLUCAP 192 (H) 11/27/2022   GLUCAP 238 (H) 11/18/2022   GLUCAP 248 (H) 11/18/2022  GLUCAP 170 (H) 10/22/2022   GLUCAP 156 (H) 10/22/2022     Exercise Target Goals: Exercise Program Goal: Individual exercise prescription set using results from initial 6 min walk test and THRR while considering  patient's activity barriers and safety.   Exercise Prescription Goal: Initial exercise prescription builds to 30-45 minutes a day of aerobic activity, 2-3 days per week.  Home exercise guidelines will be given to patient during program as part of exercise prescription that the participant will acknowledge.  Activity Barriers & Risk Stratification:  Activity Barriers & Cardiac Risk Stratification - 11/14/22 1319       Activity Barriers & Cardiac Risk Stratification   Activity Barriers Balance Concerns     Cardiac Risk Stratification High             6 Minute Walk:  6 Minute Walk     Row Name 11/14/22 0903         6 Minute Walk   Phase Initial     Distance 1708 feet     Walk Time 6 minutes     # of Rest Breaks 0     MPH 3.23     METS 4.63     RPE 8     Perceived Dyspnea  0     VO2 Peak 16.22     Symptoms No     Resting HR 86 bpm     Resting BP 110/72     Resting Oxygen Saturation  100 %     Exercise Oxygen Saturation  during 6 min walk 100 %     Max Ex. HR 118 bpm     Max Ex. BP 138/80     2 Minute Post BP 114/70              Oxygen Initial Assessment:   Oxygen Re-Evaluation:   Oxygen Discharge (Final Oxygen Re-Evaluation):   Initial Exercise Prescription:  Initial Exercise Prescription - 11/14/22 1300       Date of Initial Exercise RX and Referring Provider   Date 11/14/22    Referring Provider Bryan Lemma, MD    Expected Discharge Date 01/25/23      Recumbant Bike   Level 2.2    RPM 60    Watts 25    Minutes 15    METs 4.6      NuStep   Level 2    SPM 80    Minutes 15    METs 4.6      Prescription Details   Frequency (times per week) 3    Duration Progress to 30 minutes of continuous aerobic without signs/symptoms of physical distress      Intensity   THRR 40-80% of Max Heartrate 66-133    Ratings of Perceived Exertion 11-13    Perceived Dyspnea 0-4      Progression   Progression Continue progressive overload as per policy without signs/symptoms or physical distress.      Resistance Training   Training Prescription Yes    Weight 4 lbs    Reps 10-15             Perform Capillary Blood Glucose checks as needed.  Exercise Prescription Changes:   Exercise Prescription Changes     Row Name 11/18/22 1200             Response to Exercise   Blood Pressure (Admit) 114/74       Blood Pressure (Exercise) 152/84       Blood Pressure (  Exit) 107/71       Heart Rate (Admit) 95 bpm       Heart Rate (Exercise) 117 bpm        Heart Rate (Exit) 98 bpm       Rating of Perceived Exertion (Exercise) 13       Symptoms Fatigue       Comments Pt's first day in the CRP2 program       Duration Progress to 30 minutes of  aerobic without signs/symptoms of physical distress       Intensity THRR unchanged         Progression   Progression Continue to progress workloads to maintain intensity without signs/symptoms of physical distress.       Average METs 2.1         Resistance Training   Training Prescription Yes       Weight 4 lbs       Reps 10-15       Time 10 Minutes         Interval Training   Interval Training No         Recumbant Bike   Level 2       RPM 61       Watts 33       Minutes 15       METs 2.2         NuStep   Level 1  reduced from level 2  to level 1 due to fatigue       SPM 68       Minutes 10       METs 2                Exercise Comments:   Exercise Comments     Row Name 11/18/22 1207           Exercise Comments Pt's first day in the CRP2 program. Pt becaem fatuiged on the nustep and level was reduced to 1. Pt did 10 minutes on nustep. No pain or other symptoms noted.                Exercise Goals and Review:   Exercise Goals     Row Name 11/14/22 1321             Exercise Goals   Increase Physical Activity Yes       Expected Outcomes Short Term: Attend rehab on a regular basis to increase amount of physical activity.;Long Term: Add in home exercise to make exercise part of routine and to increase amount of physical activity.;Long Term: Exercising regularly at least 3-5 days a week.       Increase Strength and Stamina Yes       Intervention Provide advice, education, support and counseling about physical activity/exercise needs.;Develop an individualized exercise prescription for aerobic and resistive training based on initial evaluation findings, risk stratification, comorbidities and participant's personal goals.       Expected Outcomes Short Term: Increase  workloads from initial exercise prescription for resistance, speed, and METs.;Short Term: Perform resistance training exercises routinely during rehab and add in resistance training at home;Long Term: Improve cardiorespiratory fitness, muscular endurance and strength as measured by increased METs and functional capacity ( )       Able to understand and use rate of perceived exertion (RPE) scale Yes       Intervention Provide education and explanation on how to use RPE scale       Expected Outcomes  Short Term: Able to use RPE daily in rehab to express subjective intensity level;Long Term:  Able to use RPE to guide intensity level when exercising independently       Knowledge and understanding of Target Heart Rate Range (THRR) Yes       Intervention Provide education and explanation of THRR including how the numbers were predicted and where they are located for reference       Expected Outcomes Short Term: Able to state/look up THRR;Long Term: Able to use THRR to govern intensity when exercising independently;Short Term: Able to use daily as guideline for intensity in rehab       Understanding of Exercise Prescription Yes       Intervention Provide education, explanation, and written materials on patient's individual exercise prescription       Expected Outcomes Short Term: Able to explain program exercise prescription;Long Term: Able to explain home exercise prescription to exercise independently                Exercise Goals Re-Evaluation :  Exercise Goals Re-Evaluation     Row Name 11/18/22 1206             Exercise Goal Re-Evaluation   Exercise Goals Review Increase Physical Activity;Increase Strength and Stamina;Able to understand and use rate of perceived exertion (RPE) scale;Knowledge and understanding of Target Heart Rate Range (THRR);Understanding of Exercise Prescription       Comments Pt's first day in the CRP2 program. Pt understands the exercise Rx, RPE sclae and THRR.        Expected Outcomes Will continue to monitor patient and progress exercise workloads as tolerated.                Discharge Exercise Prescription (Final Exercise Prescription Changes):  Exercise Prescription Changes - 11/18/22 1200       Response to Exercise   Blood Pressure (Admit) 114/74    Blood Pressure (Exercise) 152/84    Blood Pressure (Exit) 107/71    Heart Rate (Admit) 95 bpm    Heart Rate (Exercise) 117 bpm    Heart Rate (Exit) 98 bpm    Rating of Perceived Exertion (Exercise) 13    Symptoms Fatigue    Comments Pt's first day in the CRP2 program    Duration Progress to 30 minutes of  aerobic without signs/symptoms of physical distress    Intensity THRR unchanged      Progression   Progression Continue to progress workloads to maintain intensity without signs/symptoms of physical distress.    Average METs 2.1      Resistance Training   Training Prescription Yes    Weight 4 lbs    Reps 10-15    Time 10 Minutes      Interval Training   Interval Training No      Recumbant Bike   Level 2    RPM 61    Watts 33    Minutes 15    METs 2.2      NuStep   Level 1   reduced from level 2  to level 1 due to fatigue   SPM 68    Minutes 10    METs 2             Nutrition:  Target Goals: Understanding of nutrition guidelines, daily intake of sodium 1500mg , cholesterol 200mg , calories 30% from fat and 7% or less from saturated fats, daily to have 5 or more servings of fruits and vegetables.  Biometrics:  Pre  Biometrics - 11/14/22 0815       Pre Biometrics   Waist Circumference 41.5 inches    Hip Circumference 41.5 inches    Waist to Hip Ratio 1 %    Triceps Skinfold 12 mm    % Body Fat 27.4 %    Grip Strength 42 kg    Flexibility 12.5 in    Single Leg Stand 10.68 seconds              Nutrition Therapy Plan and Nutrition Goals:  Nutrition Therapy & Goals - 11/22/22 1010       Nutrition Therapy   Diet Heart Healthy/Carbohydrate Consistent  Diet    Drug/Food Interactions Statins/Certain Fruits      Personal Nutrition Goals   Nutrition Goal Patient to identify strategies for reducing cardiovascular risk by attending the Pritikin education and nutrition series weekly.    Personal Goal #2 Patient to improve diet quality by using the plate method as a guide for meal planning to include lean protein/plant protein, fruits, vegetables, whole grains, nonfat dairy as part of a well-balanced diet.    Personal Goal #3 Patient to identify strategies for improving blood sugar control with goal A1c <7%    Comments Discussed the plate method as a guide for meal planning and protein and high fiber foods to aid with blood sugar control. Dequon reports taking jardiance and using the humalog sliding scale regiment he was given in the hospital. He reports not taking tresiba over the last few days; recommended follow-up with endocrinology. He has started making some dietary changes including increasing lean protein and increasing vegetable and fruit intake.  He will benefit from participation in intensive cardiac rehab for nutrition, exercise, and lifestyle modification.      Intervention Plan   Intervention Prescribe, educate and counsel regarding individualized specific dietary modifications aiming towards targeted core components such as weight, hypertension, lipid management, diabetes, heart failure and other comorbidities.;Nutrition handout(s) given to patient.    Expected Outcomes Short Term Goal: Understand basic principles of dietary content, such as calories, fat, sodium, cholesterol and nutrients.;Long Term Goal: Adherence to prescribed nutrition plan.             Nutrition Assessments:  Nutrition Assessments - 11/21/22 1625       Rate Your Plate Scores   Pre Score 62            MEDIFICTS Score Key: ?70 Need to make dietary changes  40-70 Heart Healthy Diet ? 40 Therapeutic Level Cholesterol Diet   Flowsheet Row INTENSIVE  CARDIAC REHAB from 11/20/2022 in [facility] for Heart, Vascular, & Lung Health  Picture Your Plate Total Score on Admission 62      Picture Your Plate Scores: <16 Unhealthy dietary pattern with much room for improvement. 41-50 Dietary pattern unlikely to meet recommendations for good health and room for improvement. 51-60 More healthful dietary pattern, with some room for improvement.  >60 Healthy dietary pattern, although there may be some specific behaviors that could be improved.    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  His blood sugar is currently poorly controlled. He will benefit from participation in intensive cardiac rehab for nutrition, exercise, and lifestyle modification. Discussed the plate method as a guide for meal planning and protein and high fiber foods to aid with blood sugar control. Adrienne reports taking jardiance and using the humalog sliding scale regiment he was given in the hospital. He reports not taking tresiba over the last few days; recommended follow-up with endocrinology. He has started making some dietary changes including increasing lean protein and increasing vegetable and fruit intake. He will benefit from participation in intensive cardiac rehab for nutrition, exercise, and lifestyle modification.               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 His blood sugar is currently poorly controlled. He will benefit from participation in intensive cardiac rehab for nutrition, exercise, and lifestyle modification. Discussed the plate method as a guide for meal planning and protein and high fiber foods to aid with blood sugar control. Paras reports taking jardiance and using the humalog sliding scale regiment he was given in the hospital. He reports not taking tresiba over the last few days; recommended follow-up with endocrinology. He has started making some dietary changes including increasing lean protein and increasing vegetable and fruit intake. He will benefit from participation in intensive cardiac rehab for nutrition, exercise, and lifestyle modification.               Nutrition Goals Discharge (Final Nutrition Goals Re-Evaluation):  Nutrition Goals Re-Evaluation - 11/22/22 1010       Goals   Current Weight 206 lb 12.7 oz (93.8 kg)    Comment A1c 12.0, lipoprotein A 125.9, lipids WNL    Expected Outcome Discussed the plate method as a guide for meal planning and protein and high fiber foods to aid with blood sugar control. Psalm reports taking jardiance and using the humalog sliding scale regiment he was given in the hospital. He reports not taking tresiba over the last few days; recommended follow-up with endocrinology. He has started making some dietary changes including increasing lean protein and increasing vegetable and fruit intake. He will benefit from participation in intensive cardiac rehab for nutrition, exercise, and lifestyle modification.             Psychosocial: Target Goals: Acknowledge presence or absence of significant depression and/or stress, maximize coping skills, provide positive support system. Participant is able to verbalize types and ability to use techniques and skills needed for reducing stress and depression.  Initial Review & Psychosocial Screening:  Initial Psych Review  & Screening - 11/14/22 1014       Initial Review   Current issues with Current Sleep Concerns   on-going for many years per patient     Family Dynamics   Good Support System? Yes   Has sposue and brother who is an Charity fundraiser for support     Barriers   Psychosocial barriers to participate in program There are no identifiable barriers or psychosocial needs.      Screening Interventions   Interventions Encouraged to exercise  Quality of Life Scores:  Quality of Life - 11/14/22 1316       Quality of Life   Select Quality of Life      Quality of Life Scores   Health/Function Pre 27.11 %    Socioeconomic Pre 28 %    Psych/Spiritual Pre 27.86 %    Family Pre 25.8 %    GLOBAL Pre 27.84 %            Scores of 19 and below usually indicate a poorer quality of life in these areas.  A difference of  2-3 points is a clinically meaningful difference.  A difference of 2-3 points in the total score of the Quality of Life Index has been associated with significant improvement in overall quality of life, self-image, physical symptoms, and general health in studies assessing change in quality of life.  PHQ-9: Review Flowsheet  More data exists      11/14/2022 09/04/2018 08/24/2018 07/08/2018 03/10/2018  Depression screen PHQ 2/9  Decreased Interest 0 0 0 0 0  Down, Depressed, Hopeless 0 0 0 0 0  PHQ - 2 Score 0 0 0 0 0  Altered sleeping 3 - - - -  Tired, decreased energy 0 - - - -  Change in appetite 0 - - - -  Feeling bad or failure about yourself  0 - - - -  Trouble concentrating 0 - - - -  Moving slowly or fidgety/restless 0 - - - -  Suicidal thoughts 0 - - - -  PHQ-9 Score 3 - - - -  Difficult doing work/chores Not difficult at all - - - -   Interpretation of Total Score  Total Score Depression Severity:  1-4 = Minimal depression, 5-9 = Mild depression, 10-14 = Moderate depression, 15-19 = Moderately severe depression, 20-27 = Severe depression   Psychosocial  Evaluation and Intervention:   Psychosocial Re-Evaluation:  Psychosocial Re-Evaluation     Row Name 11/18/22 1714 11/27/22 0946           Psychosocial Re-Evaluation   Current issues with Current Sleep Concerns None Identified      Comments Ivy did not voice any concerns or stressors on his first day of exercise --      Expected Outcomes Kiran will have decreased concerns or stressors upon complketion of intensive cardiac rehab. --      Interventions -- Encouraged to attend Cardiac Rehabilitation for the exercise      Continue Psychosocial Services  Follow up required by staff No Follow up required        Initial Review   Comments Will continue to monitor and offer support as needed Will continue to monitor and offer support as needed               Psychosocial Discharge (Final Psychosocial Re-Evaluation):  Psychosocial Re-Evaluation - 11/27/22 0946       Psychosocial Re-Evaluation   Current issues with None Identified    Interventions Encouraged to attend Cardiac Rehabilitation for the exercise    Continue Psychosocial Services  No Follow up required      Initial Review   Comments Will continue to monitor and offer support as needed             Vocational Rehabilitation: Provide vocational rehab assistance to qualifying candidates.   Vocational Rehab Evaluation & Intervention:  Vocational Rehab - 11/14/22 1015       Initial Vocational Rehab Evaluation & Intervention   Assessment shows need  for Vocational Rehabilitation No   Pt denies any needs at this time            Education: Education Goals: Education classes will be provided on a weekly basis, covering required topics. Participant will state understanding/return demonstration of topics presented.    Education     Row Name 11/18/22 0900     Education   Cardiac Education Topics Pritikin   Select Core Videos     Core Videos   Educator Dietitian   Select Nutrition   Nutrition Facts on Fat    Instruction Review Code 1- Verbalizes Understanding   Class Start Time 0815   Class Stop Time [phone]   Class Time Calculation (min) 37 min    Row Name 11/20/22 1000     Education   Cardiac Education Topics Pritikin   Secondary school teacher School   Educator Dietitian   Weekly Topic Fast Evening Meals   Instruction Review Code 1- Verbalizes Understanding   Class Start Time 0815   Class Stop Time 9604   Class Time Calculation (min) 37 min    Row Name 11/22/22 0900     Education   Cardiac Education Topics Pritikin   Select Core Videos     Core Videos   Educator Dietitian   Select Nutrition   Nutrition Vitamins and Minerals   Instruction Review Code 1- Verbalizes Understanding   Class Start Time 0815   Class Stop Time 0855   Class Time Calculation (min) 40 min    Row Name 11/25/22 1100     Education   Cardiac Education Topics Pritikin   Select Workshops     Workshops   Educator Exercise Physiologist   Select Psychosocial   Psychosocial Workshop Healthy Sleep for a Healthy Heart   Instruction Review Code 1- Verbalizes Understanding   Class Start Time 0815   Class Stop Time 0856   Class Time Calculation (min) 41 min            Core Videos: Exercise    Move It!  Clinical staff conducted group or individual video education with verbal and written material and guidebook.  Patient learns the recommended Pritikin exercise program. Exercise with the goal of living a long, healthy life. Some of the health benefits of exercise include controlled diabetes, healthier blood pressure levels, improved cholesterol levels, improved heart and lung capacity, improved sleep, and better body composition. Everyone should speak with their doctor before starting or changing an exercise routine.  Biomechanical Limitations Clinical staff conducted group or individual video education with verbal and written material and guidebook.  Patient learns how biomechanical  limitations can impact exercise and how we can mitigate and possibly overcome limitations to have an impactful and balanced exercise routine.  Body Composition Clinical staff conducted group or individual video education with verbal and written material and guidebook.  Patient learns that body composition (ratio of muscle mass to fat mass) is a key component to assessing overall fitness, rather than body weight alone. Increased fat mass, especially visceral belly fat, can put [place] at increased risk for metabolic syndrome, type 2 diabetes, heart disease, and even death. It is recommended to combine diet and exercise (cardiovascular and resistance training) to improve your body composition. Seek guidance from your physician and exercise physiologist before implementing an exercise routine.  Exercise Action Plan Clinical staff conducted group or individual video education with verbal and written material and guidebook.  Patient learns the recommended strategies to  achieve and enjoy long-term exercise adherence, including variety, self-motivation, self-efficacy, and positive decision making. Benefits of exercise include fitness, good health, weight management, more energy, better sleep, less stress, and overall well-being.  Medical   Heart Disease Risk Reduction Clinical staff conducted group or individual video education with verbal and written material and guidebook.  Patient learns our heart is our most vital organ as it circulates oxygen, nutrients, white blood cells, and hormones throughout the entire body, and carries waste away. Data supports a plant-based eating plan like the Pritikin Program for its effectiveness in slowing progression of and reversing heart disease. The video provides a number of recommendations to address heart disease.   Metabolic Syndrome and Belly Fat  Clinical staff conducted group or individual video education with verbal and written material and guidebook.  Patient learns  what metabolic syndrome is, how it leads to heart disease, and how one can reverse it and keep it from coming back. You have metabolic syndrome if you have 3 of the following 5 criteria: abdominal obesity, high blood pressure, high triglycerides, low HDL cholesterol, and high blood sugar.  Hypertension and Heart Disease Clinical staff conducted group or individual video education with verbal and written material and guidebook.  Patient learns that high blood pressure, or hypertension, is very common in the [place]. Hypertension is largely due to excessive salt intake, but other important risk factors include being overweight, physical inactivity, drinking too much alcohol, smoking, and not eating enough potassium from fruits and vegetables. High blood pressure is a leading risk factor for heart attack, stroke, congestive heart failure, dementia, kidney failure, and premature death. Long-term effects of excessive salt intake include stiffening of the arteries and thickening of heart muscle and organ damage. Recommendations include ways to reduce hypertension and the risk of heart disease.  Diseases of Our Time - Focusing on Diabetes Clinical staff conducted group or individual video education with verbal and written material and guidebook.  Patient learns why the best way to stop diseases of our time is prevention, through food and other lifestyle changes. Medicine (such as prescription pills and surgeries) is often only a Band-Aid on the problem, not a long-term solution. Most common diseases of our time include obesity, type 2 diabetes, hypertension, heart disease, and cancer. The Pritikin Program is recommended and has been proven to help reduce, reverse, and/or prevent the damaging effects of metabolic syndrome.  Nutrition   Overview of the Pritikin Eating Plan  Clinical staff conducted group or individual video education with verbal and written material and guidebook.  Patient learns about the  Pritikin Eating Plan for disease risk reduction. The Pritikin Eating Plan emphasizes a wide variety of unrefined, minimally-processed carbohydrates, like fruits, vegetables, whole grains, and legumes. Go, Caution, and Stop food choices are explained. Plant-based and lean animal proteins are emphasized. Rationale provided for low sodium intake for blood pressure control, low added sugars for blood sugar stabilization, and low added fats and oils for coronary artery disease risk reduction and weight management.  Calorie Density  Clinical staff conducted group or individual video education with verbal and written material and guidebook.  Patient learns about calorie density and how it impacts the Pritikin Eating Plan. Knowing the characteristics of the food you choose will help you decide whether those foods will lead to weight gain or weight loss, and whether you want to consume more or less of them. Weight loss is usually a side effect of the Pritikin Eating Plan because of its focus  on low calorie-dense foods.  Label Reading  Clinical staff conducted group or individual video education with verbal and written material and guidebook.  Patient learns about the Pritikin recommended label reading guidelines and corresponding recommendations regarding calorie density, added sugars, sodium content, and whole grains.  Dining Out - Part 1  Clinical staff conducted group or individual video education with verbal and written material and guidebook.  Patient learns that restaurant meals can be sabotaging because they can be so high in calories, fat, sodium, and/or sugar. Patient learns recommended strategies on how to positively address this and avoid unhealthy pitfalls.  Facts on Fats  Clinical staff conducted group or individual video education with verbal and written material and guidebook.  Patient learns that lifestyle modifications can be just as effective, if not more so, as many medications for lowering  your risk of heart disease. A Pritikin lifestyle can help to reduce your risk of inflammation and atherosclerosis (cholesterol build-up, or plaque, in the artery walls). Lifestyle interventions such as dietary choices and physical activity address the cause of atherosclerosis. A review of the types of fats and their impact on blood cholesterol levels, along with dietary recommendations to reduce fat intake is also included.  Nutrition Action Plan  Clinical staff conducted group or individual video education with verbal and written material and guidebook.  Patient learns how to incorporate Pritikin recommendations into their lifestyle. Recommendations include planning and keeping personal health goals in mind as an important part of their success.  Healthy Mind-Set    Healthy Minds, Bodies, Hearts  Clinical staff conducted group or individual video education with verbal and written material and guidebook.  Patient learns how to identify when they are stressed. Video will discuss the impact of that stress, as well as the many benefits of stress management. Patient will also be introduced to stress management techniques. The way we think, act, and feel has an impact on our hearts.  How Our Thoughts Can Heal Our Hearts  Clinical staff conducted group or individual video education with verbal and written material and guidebook.  Patient learns that negative thoughts can cause depression and anxiety. This can result in negative lifestyle behavior and serious health problems. Cognitive behavioral therapy is an effective method to help control our thoughts in order to change and improve our emotional outlook.  Additional Videos:  Exercise    Improving Performance  Clinical staff conducted group or individual video education with verbal and written material and guidebook.  Patient learns to use a non-linear approach by alternating intensity levels and lengths of time spent exercising to help burn more  calories and lose more body fat. Cardiovascular exercise helps improve heart health, metabolism, hormonal balance, blood sugar control, and recovery from fatigue. Resistance training improves strength, endurance, balance, coordination, reaction time, metabolism, and muscle mass. Flexibility exercise improves circulation, posture, and balance. Seek guidance from your physician and exercise physiologist before implementing an exercise routine and learn your capabilities and proper form for all exercise.  Introduction to Yoga  Clinical staff conducted group or individual video education with verbal and written material and guidebook.  Patient learns about yoga, a discipline of the coming together of mind, breath, and body. The benefits of yoga include improved flexibility, improved range of motion, better posture and core strength, increased lung function, weight loss, and positive self-image. Yoga's heart health benefits include lowered blood pressure, healthier heart rate, decreased cholesterol and triglyceride levels, improved immune function, and reduced stress. Seek guidance from your physician  and exercise physiologist before implementing an exercise routine and learn your capabilities and proper form for all exercise.  Medical   Aging: Enhancing Your Quality of Life  Clinical staff conducted group or individual video education with verbal and written material and guidebook.  Patient learns key strategies and recommendations to stay in good physical health and enhance quality of life, such as prevention strategies, having an advocate, securing a Health Care Proxy and Power of Attorney, and keeping a list of medications and system for tracking them. It also discusses how to avoid risk for bone loss.  Biology of Weight Control  Clinical staff conducted group or individual video education with verbal and written material and guidebook.  Patient learns that weight gain occurs because we consume more  calories than we burn (eating more, moving less). Even if your body weight is normal, you may have higher ratios of fat compared to muscle mass. Too much body fat puts you at increased risk for cardiovascular disease, heart attack, stroke, type 2 diabetes, and obesity-related cancers. In addition to exercise, following the Pritikin Eating Plan can help reduce your risk.  Decoding Lab Results  Clinical staff conducted group or individual video education with verbal and written material and guidebook.  Patient learns that lab test reflects one measurement whose values change over time and are influenced by many factors, including medication, stress, sleep, exercise, food, hydration, pre-existing medical conditions, and more. It is recommended to use the knowledge from this video to become more involved with your lab results and evaluate your numbers to speak with your doctor.   Diseases of Our Time - Overview  Clinical staff conducted group or individual video education with verbal and written material and guidebook.  Patient learns that according to the CDC, 50% to 70% of chronic diseases (such as obesity, type 2 diabetes, elevated lipids, hypertension, and heart disease) are avoidable through lifestyle improvements including healthier food choices, listening to satiety cues, and increased physical activity.  Sleep Disorders Clinical staff conducted group or individual video education with verbal and written material and guidebook.  Patient learns how good quality and duration of sleep are important to overall health and well-being. Patient also learns about sleep disorders and how they impact health along with recommendations to address them, including discussing with a physician.  Nutrition  Dining Out - Part 2 Clinical staff conducted group or individual video education with verbal and written material and guidebook.  Patient learns how to plan ahead and communicate in order to maximize their  dining experience in a healthy and nutritious manner. Included are recommended food choices based on the type of restaurant the patient is visiting.   Fueling a Banker conducted group or individual video education with verbal and written material and guidebook.  There is a strong connection between our food choices and our health. Diseases like obesity and type 2 diabetes are very prevalent and are in large-part due to lifestyle choices. The Pritikin Eating Plan provides plenty of food and hunger-curbing satisfaction. It is easy to follow, affordable, and helps reduce health risks.  Menu Workshop  Clinical staff conducted group or individual video education with verbal and written material and guidebook.  Patient learns that restaurant meals can sabotage health goals because they are often packed with calories, fat, sodium, and sugar. Recommendations include strategies to plan ahead and to communicate with the manager, chef, or server to help order a healthier meal.  Planning Your Eating Strategy  Clinical  staff conducted group or individual video education with verbal and written material and guidebook.  Patient learns about the Pritikin Eating Plan and its benefit of reducing the risk of disease. The Pritikin Eating Plan does not focus on calories. Instead, it emphasizes high-quality, nutrient-rich foods. By knowing the characteristics of the foods, we choose, we can determine their calorie density and make informed decisions.  Targeting Your Nutrition Priorities  Clinical staff conducted group or individual video education with verbal and written material and guidebook.  Patient learns that lifestyle habits have a tremendous impact on disease risk and progression. This video provides eating and physical activity recommendations based on your personal health goals, such as reducing LDL cholesterol, losing weight, preventing or controlling type 2 diabetes, and reducing high  blood pressure.  Vitamins and Minerals  Clinical staff conducted group or individual video education with verbal and written material and guidebook.  Patient learns different ways to obtain key vitamins and minerals, including through a recommended healthy diet. It is important to discuss all supplements you take with your doctor.   Healthy Mind-Set    Smoking Cessation  Clinical staff conducted group or individual video education with verbal and written material and guidebook.  Patient learns that cigarette smoking and tobacco addiction pose a serious health risk which affects millions of people. Stopping smoking will significantly reduce the risk of heart disease, lung disease, and many forms of cancer. Recommended strategies for quitting are covered, including working with your doctor to develop a successful plan.  Culinary   Becoming a Set designer conducted group or individual video education with verbal and written material and guidebook.  Patient learns that cooking at home can be healthy, cost-effective, quick, and puts them in control. Keys to cooking healthy recipes will include looking at your recipe, assessing your equipment needs, planning ahead, making it simple, choosing cost-effective seasonal ingredients, and limiting the use of added fats, salts, and sugars.  Cooking - Breakfast and Snacks  Clinical staff conducted group or individual video education with verbal and written material and guidebook.  Patient learns how important breakfast is to satiety and nutrition through the entire day. Recommendations include key foods to eat during breakfast to help stabilize blood sugar levels and to prevent overeating at meals later in the day. Planning ahead is also a key component.  Cooking - Educational psychologist conducted group or individual video education with verbal and written material and guidebook.  Patient learns eating strategies to improve overall  health, including an approach to cook more at home. Recommendations include thinking of animal protein as a side on your plate rather than center stage and focusing instead on lower calorie dense options like vegetables, fruits, whole grains, and plant-based proteins, such as beans. Making sauces in large quantities to freeze for later and leaving the skin on your vegetables are also recommended to maximize your experience.  Cooking - Healthy Salads and Dressing Clinical staff conducted group or individual video education with verbal and written material and guidebook.  Patient learns that vegetables, fruits, whole grains, and legumes are the foundations of the Pritikin Eating Plan. Recommendations include how to incorporate each of these in flavorful and healthy salads, and how to create homemade salad dressings. Proper handling of ingredients is also covered. Cooking - Soups and [employer] - Soups and Desserts Clinical staff conducted group or individual video education with verbal and written material and guidebook.  Patient learns that Pritikin soups  and desserts make for easy, nutritious, and delicious snacks and meal components that are low in sodium, fat, sugar, and calorie density, while high in vitamins, minerals, and filling fiber. Recommendations include simple and healthy ideas for soups and desserts.   Overview     The Pritikin Solution Program Overview Clinical staff conducted group or individual video education with verbal and written material and guidebook.  Patient learns that the results of the Pritikin Program have been documented in more than 100 articles published in peer-reviewed journals, and the benefits include reducing risk factors for (and, in some cases, even reversing) high cholesterol, high blood pressure, type 2 diabetes, obesity, and more! An overview of the three key pillars of the Pritikin Program will be covered: eating well, doing regular exercise, and having a  healthy mind-set.  WORKSHOPS  Exercise: Exercise Basics: Building Your Action Plan Clinical staff led group instruction and group discussion with PowerPoint presentation and patient guidebook. To enhance the learning environment the use of posters, models and videos may be added. At the conclusion of this workshop, patients will comprehend the difference between physical activity and exercise, as well as the benefits of incorporating both, into their routine. Patients will understand the FITT (Frequency, Intensity, Time, and Type) principle and how to use it to build an exercise action plan. In addition, safety concerns and other considerations for exercise and cardiac rehab will be addressed by the presenter. The purpose of this lesson is to promote a comprehensive and effective weekly exercise routine in order to improve patients' overall level of fitness.   Managing Heart Disease: Your Path to a Healthier Heart Clinical staff led group instruction and group discussion with PowerPoint presentation and patient guidebook. To enhance the learning environment the use of posters, models and videos may be added.At the conclusion of this workshop, patients will understand the anatomy and physiology of the heart. Additionally, they will understand how Pritikin's three pillars impact the risk factors, the progression, and the management of heart disease.  The purpose of this lesson is to provide a high-level overview of the heart, heart disease, and how the Pritikin lifestyle positively impacts risk factors.  Exercise Biomechanics Clinical staff led group instruction and group discussion with PowerPoint presentation and patient guidebook. To enhance the learning environment the use of posters, models and videos may be added. Patients will learn how the structural parts of their bodies function and how these functions impact their daily activities, movement, and exercise. Patients will learn how to  promote a neutral spine, learn how to manage pain, and identify ways to improve their physical movement in order to promote healthy living. The purpose of this lesson is to expose patients to common physical limitations that impact physical activity. Participants will learn practical ways to adapt and manage aches and pains, and to minimize their effect on regular exercise. Patients will learn how to maintain good posture while sitting, walking, and lifting.  Balance Training and Fall Prevention  Clinical staff led group instruction and group discussion with PowerPoint presentation and patient guidebook. To enhance the learning environment the use of posters, models and videos may be added. At the conclusion of this workshop, patients will understand the importance of their sensorimotor skills (vision, proprioception, and the vestibular system) in maintaining their ability to balance as they age. Patients will apply a variety of balancing exercises that are appropriate for their current level of function. Patients will understand the common causes for poor balance, possible solutions to these problems,  and ways to modify their physical environment in order to minimize their fall risk. The purpose of this lesson is to teach patients about the importance of maintaining balance as they age and ways to minimize their risk of falling.  WORKSHOPS   Nutrition:  Fueling a Ship broker led group instruction and group discussion with PowerPoint presentation and patient guidebook. To enhance the learning environment the use of posters, models and videos may be added. Patients will review the foundational principles of the Pritikin Eating Plan and understand what constitutes a serving size in each of the food groups. Patients will also learn Pritikin-friendly foods that are better choices when away from home and review make-ahead meal and snack options. Calorie density will be reviewed and  applied to three nutrition priorities: weight maintenance, weight loss, and weight gain. The purpose of this lesson is to reinforce (in a group setting) the key concepts around what patients are recommended to eat and how to apply these guidelines when away from home by planning and selecting Pritikin-friendly options. Patients will understand how calorie density may be adjusted for different weight management goals.  Mindful Eating  Clinical staff led group instruction and group discussion with PowerPoint presentation and patient guidebook. To enhance the learning environment the use of posters, models and videos may be added. Patients will briefly review the concepts of the Pritikin Eating Plan and the importance of low-calorie dense foods. The concept of mindful eating will be introduced as well as the importance of paying attention to internal hunger signals. Triggers for non-hunger eating and techniques for dealing with triggers will be explored. The purpose of this lesson is to provide patients with the opportunity to review the basic principles of the Pritikin Eating Plan, discuss the value of eating mindfully and how to measure internal cues of hunger and fullness using the Hunger Scale. Patients will also discuss reasons for non-hunger eating and learn strategies to use for controlling emotional eating.  Targeting Your Nutrition Priorities Clinical staff led group instruction and group discussion with PowerPoint presentation and patient guidebook. To enhance the learning environment the use of posters, models and videos may be added. Patients will learn how to determine their genetic susceptibility to disease by reviewing their family history. Patients will gain insight into the importance of diet as part of an overall healthy lifestyle in mitigating the impact of genetics and other environmental insults. The purpose of this lesson is to provide patients with the opportunity to assess their personal  nutrition priorities by looking at their family history, their own health history and current risk factors. Patients will also be able to discuss ways of prioritizing and modifying the Pritikin Eating Plan for their highest risk areas  Menu  Clinical staff led group instruction and group discussion with PowerPoint presentation and patient guidebook. To enhance the learning environment the use of posters, models and videos may be added. Using menus brought in from [employer], or printed from [employer], patients will apply the Pritikin dining out guidelines that were presented in the [employer] video. Patients will also be able to practice these guidelines in a variety of provided scenarios. The purpose of this lesson is to provide patients with the opportunity to practice hands-on learning of the Pritikin Dining Out guidelines with actual menus and practice scenarios.  Label Reading Clinical staff led group instruction and group discussion with PowerPoint presentation and patient guidebook. To enhance the learning environment the use of posters, models  and videos may be added. Patients will review and discuss the Pritikin label reading guidelines presented in Pritikin's Label Reading Educational series video. Using fool labels brought in from local grocery stores and markets, patients will apply the label reading guidelines and determine if the packaged food meet the Pritikin guidelines. The purpose of this lesson is to provide patients with the opportunity to review, discuss, and practice hands-on learning of the Pritikin Label Reading guidelines with actual packaged food labels. Cooking School  Pritikin's [employer] are designed to teach patients ways to prepare quick, simple, and affordable recipes at home. The importance of nutrition's role in chronic disease risk reduction is reflected in its emphasis in the overall Pritikin program. By learning how to prepare  essential core Pritikin Eating Plan recipes, patients will increase control over what they eat; be able to customize the flavor of foods without the use of added salt, sugar, or fat; and improve the quality of the food they consume. By learning a set of core recipes which are easily assembled, quickly prepared, and affordable, patients are more likely to prepare more healthy foods at home. These workshops focus on convenient breakfasts, simple entres, side dishes, and desserts which can be prepared with minimal effort and are consistent with nutrition recommendations for cardiovascular risk reduction. Cooking [employer] are taught by a Armed forces logistics/support/administrative officer (RD) who has been trained by the [employer]. The chef or RD has a clear understanding of the importance of minimizing - if not completely eliminating - added fat, sugar, and sodium in recipes. Throughout the series of Cooking School Workshop sessions, patients will learn about healthy ingredients and efficient methods of cooking to build confidence in their capability to prepare    Cooking School weekly topics:  Adding Flavor- Sodium-Free  Fast and Healthy Breakfasts  Powerhouse Plant-Based Proteins  Satisfying Salads and Dressings  Simple Sides and Sauces  International Cuisine-Spotlight on the [employer] Zones  Delicious Desserts  Savory Soups  [employer] - Meals in a Astronomer Appetizers and Snacks  Comforting Weekend Breakfasts  One-Pot Wonders   Fast Evening Meals  Landscape architect Your Pritikin Plate  WORKSHOPS   Healthy Mindset (Psychosocial):  Focused Goals, Sustainable Changes Clinical staff led group instruction and group discussion with PowerPoint presentation and patient guidebook. To enhance the learning environment the use of posters, models and videos may be added. Patients will be able to apply effective goal setting strategies to establish at least one personal goal, and  then take consistent, meaningful action toward that goal. They will learn to identify common barriers to achieving personal goals and develop strategies to overcome them. Patients will also gain an understanding of how our mind-set can impact our ability to achieve goals and the importance of cultivating a positive and growth-oriented mind-set. The purpose of this lesson is to provide patients with a deeper understanding of how to set and achieve personal goals, as well as the tools and strategies needed to overcome common obstacles which may arise along the way.  From Head to Heart: The Power of a Healthy Outlook  Clinical staff led group instruction and group discussion with PowerPoint presentation and patient guidebook. To enhance the learning environment the use of posters, models and videos may be added. Patients will be able to recognize and describe the impact of emotions and mood on physical health. They will discover the importance of self-care and explore self-care practices which may work for them.  Patients will also learn how to utilize the 4 C's to cultivate a healthier outlook and better manage stress and challenges. The purpose of this lesson is to demonstrate to patients how a healthy outlook is an essential part of maintaining good health, especially as they continue their cardiac rehab journey.  Healthy Sleep for a Healthy Heart Clinical staff led group instruction and group discussion with PowerPoint presentation and patient guidebook. To enhance the learning environment the use of posters, models and videos may be added. At the conclusion of this workshop, patients will be able to demonstrate knowledge of the importance of sleep to overall health, well-being, and quality of life. They will understand the symptoms of, and treatments for, common sleep disorders. Patients will also be able to identify daytime and nighttime behaviors which impact sleep, and they will be able to apply these  tools to help manage sleep-related challenges. The purpose of this lesson is to provide patients with a general overview of sleep and outline the importance of quality sleep. Patients will learn about a few of the most common sleep disorders. Patients will also be introduced to the concept of "sleep hygiene," and discover ways to self-manage certain sleeping problems through simple daily behavior changes. Finally, the workshop will motivate patients by clarifying the links between quality sleep and their goals of heart-healthy living.   Recognizing and Reducing Stress Clinical staff led group instruction and group discussion with PowerPoint presentation and patient guidebook. To enhance the learning environment the use of posters, models and videos may be added. At the conclusion of this workshop, patients will be able to understand the types of stress reactions, differentiate between acute and chronic stress, and recognize the impact that chronic stress has on their health. They will also be able to apply different coping mechanisms, such as reframing negative self-talk. Patients will have the opportunity to practice a variety of stress management techniques, such as deep abdominal breathing, progressive muscle relaxation, and/or guided imagery.  The purpose of this lesson is to educate patients on the role of stress in their lives and to provide healthy techniques for coping with it.  Learning Barriers/Preferences:  Learning Barriers/Preferences - 11/14/22 1017       Learning Barriers/Preferences   Learning Barriers None    Learning Preferences Audio;Computer/Internet;Group Instruction;Individual Instruction;Pictoral;Skilled Demonstration;Verbal Instruction;Video;Written Material             Education Topics:  Knowledge Questionnaire Score:  Knowledge Questionnaire Score - 11/14/22 1017       Knowledge Questionnaire Score   Pre Score 20/24             Core Components/Risk  Factors/Patient Goals at Admission:  Personal Goals and Risk Factors at Admission - 11/14/22 1015       Core Components/Risk Factors/Patient Goals on Admission    Weight Management Yes;Weight Loss    Intervention Weight Management: Develop a combined nutrition and exercise program designed to reach desired caloric intake, while maintaining appropriate intake of nutrient and fiber, sodium and fats, and appropriate energy expenditure required for the weight goal.;Weight Management: Provide education and appropriate resources to help participant work on and attain dietary goals.;Weight Management/Obesity: Establish reasonable short term and long term weight goals.    Admit Weight 206 lb 12.7 oz (93.8 kg)    Goal Weight: Long Term 190 lb (86.2 kg)   Pt goal   Expected Outcomes Short Term: Continue to assess and modify interventions until short term weight is achieved;Long Term: Adherence to nutrition and  physical activity/exercise program aimed toward attainment of established weight goal;Weight Loss: Understanding of general recommendations for a balanced deficit meal plan, which promotes 1-2 lb weight loss per week and includes a negative energy balance of [id number] kcal/d;Understanding recommendations for meals to include 15-35% energy as protein, 25-35% energy from fat, 35-60% energy from carbohydrates, less than 200mg  of dietary cholesterol, 20-35 gm of total fiber daily;Understanding of distribution of calorie intake throughout the day with the consumption of 4-5 meals/snacks    Diabetes Yes    Intervention Provide education about signs/symptoms and action to take for hypo/hyperglycemia.;Provide education about proper nutrition, including hydration, and aerobic/resistive exercise prescription along with prescribed medications to achieve blood glucose in normal ranges: Fasting glucose 65-99 mg/dL    Expected Outcomes Short Term: Participant verbalizes understanding of the signs/symptoms and immediate  care of hyper/hypoglycemia, proper foot care and importance of medication, aerobic/resistive exercise and nutrition plan for blood glucose control.;Long Term: Attainment of HbA1C < 7%.    Hypertension Yes    Intervention Provide education on lifestyle modifcations including regular physical activity/exercise, weight management, moderate sodium restriction and increased consumption of fresh fruit, vegetables, and low fat dairy, alcohol moderation, and smoking cessation.;Monitor prescription use compliance.    Expected Outcomes Short Term: Continued assessment and intervention until BP is < 140/41mm HG in hypertensive participants. < 130/81mm HG in hypertensive participants with diabetes, heart failure or chronic kidney disease.;Long Term: Maintenance of blood pressure at goal levels.    Lipids Yes    Intervention Provide education and support for participant on nutrition & aerobic/resistive exercise along with prescribed medications to achieve LDL 70mg , HDL >40mg .    Expected Outcomes Short Term: Participant states understanding of desired cholesterol values and is compliant with medications prescribed. Participant is following exercise prescription and nutrition guidelines.;Long Term: Cholesterol controlled with medications as prescribed, with individualized exercise RX and with personalized nutrition plan. Value goals: LDL < 70mg , HDL > 40 mg.             Core Components/Risk Factors/Patient Goals Review:   Goals and Risk Factor Review     Row Name 11/18/22 1717 11/27/22 0947           Core Components/Risk Factors/Patient Goals Review   Personal Goals Review Weight Management/Obesity;Hypertension;Lipids;Diabetes Weight Management/Obesity;Hypertension;Lipids;Diabetes      Review Chip started intensive cardiac rehab on 11/18/22. Sani did well with exercise he did report some fatigue on the nustep. Vital sigs stable. CBG's in the 200's. Steadman is doing well with exercise at  intensive  cardiac rehab. vitals signs have been stable. Some variations noted with CBG's will continue to monitor      Expected Outcomes Zebulun will continue to participate in intensive cardiac rehab for exercise, nutrition and lifstyle modifications [place] will continue to participate in intensive cardiac rehab for exercise, nutrition and lifstyle modifications               Core Components/Risk Factors/Patient Goals at Discharge (Final Review):   Goals and Risk Factor Review - 11/27/22 0947       Core Components/Risk Factors/Patient Goals Review   Personal Goals Review Weight Management/Obesity;Hypertension;Lipids;Diabetes    Review Hulen is doing well with exercise at  intensive cardiac rehab. vitals signs have been stable. Some variations noted with CBG's will continue to monitor    Expected Outcomes Jadrien will continue to participate in intensive cardiac rehab for exercise, nutrition and lifstyle modifications             ITP Comments:  ITP Comments     Row Name 11/14/22 1610 11/18/22 1713 11/27/22 0945       ITP Comments Armanda Magic, MD: Medical Director. Introduction to pritikin education program/ intensive cardiac rehab. Initial orientation packet reviewed with patient. 30 Day ITP Review. Clois started intensive cardiac rehab on 11/18/22 and did well with exercise 30 Day ITP Review. Glyn has good attendance and participation in  intensive cardiac rehab. Eitan is off to a good start to exercise              Comments: See ITP comments.Thayer Headings RN BSN

## 2022-11-29 ENCOUNTER — Encounter (HOSPITAL_COMMUNITY)
Admission: RE | Admit: 2022-11-29 | Discharge: 2022-11-29 | Disposition: A | Discharge status: Home / Self Care | Payer: 59 | Source: Ambulatory Visit | Attending: Cardiology | Admitting: Cardiology

## 2022-11-29 DIAGNOSIS — I214 Non-ST elevation (NSTEMI) myocardial infarction: Secondary | ICD-10-CM

## 2022-11-29 DIAGNOSIS — Z955 Presence of coronary angioplasty implant and graft: Secondary | ICD-10-CM

## 2022-12-01 ENCOUNTER — Other Ambulatory Visit (HOSPITAL_COMMUNITY): Payer: Self-pay

## 2022-12-02 ENCOUNTER — Encounter (HOSPITAL_COMMUNITY)
Admission: RE | Admit: 2022-12-02 | Discharge: 2022-12-02 | Disposition: A | Discharge status: Home / Self Care | Payer: 59 | Source: Ambulatory Visit | Attending: Cardiology | Admitting: Cardiology

## 2022-12-02 DIAGNOSIS — I214 Non-ST elevation (NSTEMI) myocardial infarction: Secondary | ICD-10-CM | POA: Diagnosis not present

## 2022-12-02 DIAGNOSIS — Z955 Presence of coronary angioplasty implant and graft: Secondary | ICD-10-CM

## 2022-12-04 ENCOUNTER — Other Ambulatory Visit (HOSPITAL_COMMUNITY): Payer: Self-pay

## 2022-12-04 ENCOUNTER — Encounter (HOSPITAL_COMMUNITY)
Admission: RE | Admit: 2022-12-04 | Discharge: 2022-12-04 | Disposition: A | Discharge status: Home / Self Care | Payer: 59 | Source: Ambulatory Visit | Attending: Cardiology | Admitting: Cardiology

## 2022-12-04 DIAGNOSIS — Z955 Presence of coronary angioplasty implant and graft: Secondary | ICD-10-CM

## 2022-12-04 DIAGNOSIS — I214 Non-ST elevation (NSTEMI) myocardial infarction: Secondary | ICD-10-CM

## 2022-12-04 NOTE — Progress Notes (Addendum)
Patient reported feeling lightheaded getting off the nustep. Unable to auscultate blood pressure either by manual BP, auto or doppler.  Entry BP 104/66 heart rate 97. Blood pressure 144/60 on the nustep during exercise . Telemetry rhythm Sinus.  Patient  very diaphoretic and pale. Reported seeing spots.  given 16 ounces of water. Patient was taken to the treatment room  via transfer chair and placed on a stretcher. Feet elevated. Initial blood pressure 78 systolic the 80 systolic and 76 systolic.  Patient was given more water and placed on the Zoll. CBG 182 by his CGM. Onsite provider Eligha Bridegroom NP notified and came in to evaluate the patient. Patient's wife was called and notified about today's events and came to cardiac rehab to drive the patient home. Sal remained awake and alert the whole time. Never lost consciousness. Keagen did report feeling better and less diaphoretic once he was lying down and feet were elevated. Subsequent vital signs are as follows Blood pressure 88/60 BP 94/62 BP 92/60 heart rate 73 1036 101/68 heart rate 76 lying           107/73 sitting            76 systolic standing.  Asymptomatic. Had the patient to sit down again given more water Repeat sitting blood pressure 124/72 sitting.                                                  102/70 standing I had Mr Cavey walk in the gym repeat standing BP 88/59. Patient remains asymptomatic. Offered Dang more water he said he can't drink much more he feels like he is swimming. Patient's wife present now and says he is feeling better.  Repeat blood pressure 110/75 sitting Standing blood pressure 101/69  Jaz went to the bathroom his wife went in with him without symptoms or complaints. Eligha Bridegroom NP came back to check on Mr Ridenhour again. Okay to discharge and may return to next scheduled session on 12/06/22. Marcelino Duster advised Mr Rentz to take his avapro after exercise on cardiac rehab days. Zalan was discharged from  cardiac rehab to ride home with wife without symptoms. Will forward today's vital signs to Dr Herbie Baltimore for review. Will continue to monitor the patient throughout  the program. Gladstone Lighter, RN,BSN

## 2022-12-05 ENCOUNTER — Ambulatory Visit (INDEPENDENT_AMBULATORY_CARE_PROVIDER_SITE_OTHER): Payer: 59

## 2022-12-05 VITALS — BP 110/72 | HR 76 | Temp 98.1°F | Resp 16 | Ht 70.0 in | Wt 201.0 lb

## 2022-12-05 DIAGNOSIS — K50114 Crohn's disease of large intestine with abscess: Secondary | ICD-10-CM

## 2022-12-05 MED ORDER — DIPHENHYDRAMINE HCL 25 MG PO CAPS
25.0000 mg | ORAL_CAPSULE | Freq: Once | ORAL | Status: AC
  Administered 2022-12-05: 25 mg via ORAL
  Filled 2022-12-05: qty 1

## 2022-12-05 MED ORDER — SODIUM CHLORIDE 0.9 % IV SOLN
5.0000 mg/kg | Freq: Once | INTRAVENOUS | Status: AC
  Administered 2022-12-05: 500 mg via INTRAVENOUS
  Filled 2022-12-05: qty 50

## 2022-12-05 MED ORDER — ACETAMINOPHEN 325 MG PO TABS
650.0000 mg | ORAL_TABLET | Freq: Once | ORAL | Status: AC
  Administered 2022-12-05: 650 mg via ORAL
  Filled 2022-12-05: qty 2

## 2022-12-05 MED ORDER — METHYLPREDNISOLONE SODIUM SUCC 40 MG IJ SOLR
40.0000 mg | Freq: Once | INTRAMUSCULAR | Status: AC
  Administered 2022-12-05: 40 mg via INTRAVENOUS
  Filled 2022-12-05: qty 1

## 2022-12-05 NOTE — Progress Notes (Signed)
We should have him reduce his irbesartan dose to 1/2 tablet, and his morning dose of carvedilol to 1/2 tablet   Bryan Lemma, MD

## 2022-12-05 NOTE — Progress Notes (Signed)
Diagnosis: Crohn's Disease  Provider:  Chilton Greathouse MD  Procedure: IV Infusion  IV Type: Peripheral, IV Location: L Hand  Remicade (Infliximab), Dose: 500 mg  Infusion Start Time: 0926  Infusion Stop Time: 1140  Post Infusion IV Care: Peripheral IV Discontinued  Discharge: Condition: Good, Destination: Home . AVS Declined  Performed by:  Loney Hering, LPN

## 2022-12-06 ENCOUNTER — Telehealth (HOSPITAL_COMMUNITY): Payer: Self-pay | Admitting: *Deleted

## 2022-12-06 ENCOUNTER — Telehealth: Payer: Self-pay | Admitting: *Deleted

## 2022-12-06 ENCOUNTER — Encounter (HOSPITAL_COMMUNITY)
Admission: RE | Admit: 2022-12-06 | Discharge: 2022-12-06 | Disposition: A | Discharge status: Home / Self Care | Payer: 59 | Source: Ambulatory Visit | Attending: Cardiology | Admitting: Cardiology

## 2022-12-06 DIAGNOSIS — I214 Non-ST elevation (NSTEMI) myocardial infarction: Secondary | ICD-10-CM

## 2022-12-06 DIAGNOSIS — Z955 Presence of coronary angioplasty implant and graft: Secondary | ICD-10-CM

## 2022-12-06 MED ORDER — IRBESARTAN 150 MG PO TABS: 75.0000 mg | ORAL_TABLET | Freq: Every day | ORAL | 1 refills | Status: DC

## 2022-12-06 MED ORDER — CARVEDILOL 12.5 MG PO TABS: ORAL_TABLET | ORAL | 1 refills | Status: DC

## 2022-12-06 NOTE — Telephone Encounter (Signed)
We should have him reduce his irbesartan dose to 1/2 tablet, and his morning dose of carvedilol to 1/2 tablet   David Harding, MD  

## 2022-12-06 NOTE — Telephone Encounter (Signed)
Cardiac rehab aware  will inform patient of the medication changes

## 2022-12-06 NOTE — Progress Notes (Signed)
Incomplete Session Note  Patient Details  Name: Sean Schaefer MRN: [id number] Date of Birth: 1965-10-05 Referring Provider:   Flowsheet Row INTENSIVE CARDIAC REHAB ORIENT from 11/14/2022 in [facility]  Referring Provider Bryan Lemma, MD       [name] did not complete his rehab session.  Patient reported he had some vomiting last night. Will hold exercise today. Per Dr Herbie Baltimore  patient instructed to reduce his irbesartan dose to 1/2 tablet, and his morning dose of carvedilol to 1/2 tablet. Patient states understanding and plans to return to exercise on Monday. Thayer Headings RN BSN

## 2022-12-06 NOTE — Telephone Encounter (Signed)
-----   Message from Marykay Lex, MD sent at 12/05/2022  5:49 PM EDT -----    ----- Message ----- From: Cammy Copa, RN Sent: 12/04/2022   2:22 PM EDT To: Marykay Lex, MD; Tobin Chad, RN  Good afternoon Dr Herbie Baltimore, Mr Saile was very hypotensive and symptomatic today after exercise today. Please review vital signs. Please let us know if you have any recommendations.  Thanks for your assistance, [facility]

## 2022-12-06 NOTE — Telephone Encounter (Signed)
-----   Message from David W Harding, MD sent at 12/05/2022  5:49 PM EDT -----    ----- Message ----- From: Ayame Rena W, RN Sent: 12/04/2022   2:22 PM EDT To: David W Harding, MD; Sharon Martin V, RN  Good afternoon Dr Harding, Mr Mccune was very hypotensive and symptomatic today after exercise today. Please review vital signs. Please let us know if you have any recommendations.  Thanks for your assistance, [facility] Card[facility] Re[facility] 

## 2022-12-06 NOTE — Telephone Encounter (Signed)
-----   Message from David W Harding, MD sent at 12/05/2022  5:49 PM EDT -----    ----- Message ----- From: Whitaker, Maria W, RN Sent: 12/04/2022   2:22 PM EDT To: David W Harding, MD; Nala Kachel V, RN  Good afternoon Dr Harding, Mr Kerrick was very hypotensive and symptomatic today after exercise today. Please review vital signs. Please let us know if you have any recommendations.  Thanks for your assistance, [facility] 

## 2022-12-09 ENCOUNTER — Inpatient Hospital Stay (HOSPITAL_COMMUNITY)
Admission: EM | Admit: 2022-12-09 | Discharge: 2022-12-13 | DRG: 394 | Disposition: A | Discharge status: Home / Self Care | Payer: 59 | Attending: Internal Medicine | Admitting: Internal Medicine

## 2022-12-09 ENCOUNTER — Other Ambulatory Visit: Payer: Self-pay

## 2022-12-09 ENCOUNTER — Telehealth: Payer: Self-pay | Admitting: Cardiology

## 2022-12-09 ENCOUNTER — Emergency Department (HOSPITAL_COMMUNITY): Payer: 59

## 2022-12-09 ENCOUNTER — Other Ambulatory Visit: Payer: Self-pay | Admitting: Gastroenterology

## 2022-12-09 ENCOUNTER — Other Ambulatory Visit (HOSPITAL_COMMUNITY): Payer: Self-pay

## 2022-12-09 ENCOUNTER — Encounter (HOSPITAL_COMMUNITY)
Admission: RE | Admit: 2022-12-09 | Discharge: 2022-12-09 | Disposition: A | Discharge status: Home / Self Care | Payer: 59 | Source: Ambulatory Visit | Attending: Cardiology | Admitting: Cardiology

## 2022-12-09 ENCOUNTER — Encounter (HOSPITAL_COMMUNITY): Payer: Self-pay

## 2022-12-09 DIAGNOSIS — E1165 Type 2 diabetes mellitus with hyperglycemia: Secondary | ICD-10-CM

## 2022-12-09 DIAGNOSIS — Z888 Allergy status to other drugs, medicaments and biological substances status: Secondary | ICD-10-CM

## 2022-12-09 DIAGNOSIS — Z955 Presence of coronary angioplasty implant and graft: Secondary | ICD-10-CM

## 2022-12-09 DIAGNOSIS — I2510 Atherosclerotic heart disease of native coronary artery without angina pectoris: Secondary | ICD-10-CM | POA: Diagnosis present

## 2022-12-09 DIAGNOSIS — K50919 Crohn's disease, unspecified, with unspecified complications: Secondary | ICD-10-CM

## 2022-12-09 DIAGNOSIS — K91850 Pouchitis: Secondary | ICD-10-CM

## 2022-12-09 DIAGNOSIS — R1115 Cyclical vomiting syndrome unrelated to migraine: Principal | ICD-10-CM | POA: Diagnosis present

## 2022-12-09 DIAGNOSIS — I959 Hypotension, unspecified: Secondary | ICD-10-CM | POA: Diagnosis present

## 2022-12-09 DIAGNOSIS — E785 Hyperlipidemia, unspecified: Secondary | ICD-10-CM | POA: Diagnosis present

## 2022-12-09 DIAGNOSIS — Z7984 Long term (current) use of oral hypoglycemic drugs: Secondary | ICD-10-CM

## 2022-12-09 DIAGNOSIS — K8020 Calculus of gallbladder without cholecystitis without obstruction: Secondary | ICD-10-CM | POA: Diagnosis present

## 2022-12-09 DIAGNOSIS — K2100 Gastro-esophageal reflux disease with esophagitis, without bleeding: Secondary | ICD-10-CM | POA: Diagnosis present

## 2022-12-09 DIAGNOSIS — R112 Nausea with vomiting, unspecified: Secondary | ICD-10-CM | POA: Diagnosis not present

## 2022-12-09 DIAGNOSIS — Z794 Long term (current) use of insulin: Secondary | ICD-10-CM | POA: Diagnosis not present

## 2022-12-09 DIAGNOSIS — Z833 Family history of diabetes mellitus: Secondary | ICD-10-CM

## 2022-12-09 DIAGNOSIS — R079 Chest pain, unspecified: Secondary | ICD-10-CM

## 2022-12-09 DIAGNOSIS — K5090 Crohn's disease, unspecified, without complications: Secondary | ICD-10-CM | POA: Diagnosis present

## 2022-12-09 DIAGNOSIS — R0789 Other chest pain: Secondary | ICD-10-CM | POA: Diagnosis not present

## 2022-12-09 DIAGNOSIS — K224 Dyskinesia of esophagus: Secondary | ICD-10-CM | POA: Diagnosis present

## 2022-12-09 DIAGNOSIS — K449 Diaphragmatic hernia without obstruction or gangrene: Secondary | ICD-10-CM | POA: Diagnosis present

## 2022-12-09 DIAGNOSIS — Z7902 Long term (current) use of antithrombotics/antiplatelets: Secondary | ICD-10-CM

## 2022-12-09 DIAGNOSIS — I10 Essential (primary) hypertension: Secondary | ICD-10-CM | POA: Diagnosis present

## 2022-12-09 DIAGNOSIS — K3189 Other diseases of stomach and duodenum: Secondary | ICD-10-CM | POA: Diagnosis not present

## 2022-12-09 DIAGNOSIS — I25110 Atherosclerotic heart disease of native coronary artery with unstable angina pectoris: Secondary | ICD-10-CM

## 2022-12-09 DIAGNOSIS — I252 Old myocardial infarction: Secondary | ICD-10-CM

## 2022-12-09 DIAGNOSIS — Z79899 Other long term (current) drug therapy: Secondary | ICD-10-CM

## 2022-12-09 DIAGNOSIS — K50913 Crohn's disease, unspecified, with fistula: Secondary | ICD-10-CM | POA: Diagnosis present

## 2022-12-09 DIAGNOSIS — I214 Non-ST elevation (NSTEMI) myocardial infarction: Secondary | ICD-10-CM

## 2022-12-09 DIAGNOSIS — Z7982 Long term (current) use of aspirin: Secondary | ICD-10-CM

## 2022-12-09 DIAGNOSIS — Z8249 Family history of ischemic heart disease and other diseases of the circulatory system: Secondary | ICD-10-CM

## 2022-12-09 HISTORY — DX: Type 2 diabetes mellitus without complications: E11.9

## 2022-12-09 HISTORY — DX: Atherosclerotic heart disease of native coronary artery without angina pectoris: I25.10

## 2022-12-09 HISTORY — DX: Aortic ectasia, unspecified site: I77.819

## 2022-12-09 LAB — BASIC METABOLIC PANEL
Anion gap: 14 (ref 5–15)
BUN: 19 mg/dL (ref 6–20)
CO2: 22 mmol/L (ref 22–32)
Calcium: 9.3 mg/dL (ref 8.9–10.3)
Chloride: 99 mmol/L (ref 98–111)
Creatinine, Ser: 1.17 mg/dL (ref 0.61–1.24)
GFR, Estimated: >60 mL/min (ref >60)
Glucose, Bld: 183 mg/dL — ABNORMAL HIGH (ref 70–99)
Potassium: 3.9 mmol/L (ref 3.5–5.1)
Sodium: 135 mmol/L (ref 135–145)

## 2022-12-09 LAB — TROPONIN I (HIGH SENSITIVITY)
Troponin I (High Sensitivity): 9 ng/L (ref <18)
Troponin I (High Sensitivity): 8 ng/L (ref <18)

## 2022-12-09 LAB — CBG MONITORING, ED: Glucose-Capillary: 83 mg/dL (ref 70–99)

## 2022-12-09 LAB — HEPATIC FUNCTION PANEL
ALT: 23 U/L (ref 0–44)
AST: 30 U/L (ref 15–41)
Albumin: 3.4 g/dL — ABNORMAL LOW (ref 3.5–5.0)
Alkaline Phosphatase: 48 U/L (ref 38–126)
Bilirubin, Direct: 0.2 mg/dL (ref 0.0–0.2)
Indirect Bilirubin: 1.2 mg/dL — ABNORMAL HIGH (ref 0.3–0.9)
Total Bilirubin: 1.4 mg/dL — ABNORMAL HIGH (ref 0.3–1.2)
Total Protein: 7.7 g/dL (ref 6.5–8.1)

## 2022-12-09 LAB — CBC
HCT: 39.9 % (ref 39.0–52.0)
Hemoglobin: 13.4 g/dL (ref 13.0–17.0)
MCH: 32.4 pg (ref 26.0–34.0)
MCHC: 33.6 g/dL (ref 30.0–36.0)
MCV: 96.6 fL (ref 80.0–100.0)
Platelets: 172 10*3/uL (ref 150–400)
RBC: 4.13 MIL/uL — ABNORMAL LOW (ref 4.22–5.81)
RDW: 14 % (ref 11.5–15.5)
WBC: 5.2 10*3/uL (ref 4.0–10.5)
nRBC: 0 % (ref 0.0–0.2)

## 2022-12-09 LAB — LIPASE, BLOOD: Lipase: 40 U/L (ref 11–51)

## 2022-12-09 MED ORDER — ACETAMINOPHEN 325 MG PO TABS: 650.0000 mg | ORAL_TABLET | Freq: Four times a day (QID) | ORAL | Status: DC | PRN

## 2022-12-09 MED ORDER — IOHEXOL 350 MG/ML SOLN
75.0000 mL | Freq: Once | INTRAVENOUS | Status: AC | PRN
  Administered 2022-12-09: 75 mL via INTRAVENOUS

## 2022-12-09 MED ORDER — ONDANSETRON HCL 4 MG/2ML IJ SOLN
4.0000 mg | Freq: Once | INTRAMUSCULAR | Status: AC
  Administered 2022-12-09: 4 mg via INTRAVENOUS
  Filled 2022-12-09: qty 2

## 2022-12-09 MED ORDER — SODIUM CHLORIDE 0.9 % IV SOLN
12.5000 mg | Freq: Four times a day (QID) | INTRAVENOUS | Status: DC | PRN
  Administered 2022-12-10 – 2022-12-11 (×3): 12.5 mg via INTRAVENOUS
  Filled 2022-12-09 (×2): qty 0.5

## 2022-12-09 MED ORDER — SODIUM CHLORIDE 0.9 % IV SOLN
12.5000 mg | Freq: Four times a day (QID) | INTRAVENOUS | Status: DC | PRN
  Filled 2022-12-09: qty 0.5

## 2022-12-09 MED ORDER — CIPROFLOXACIN HCL 500 MG PO TABS
500.0000 mg | ORAL_TABLET | Freq: Two times a day (BID) | ORAL | Status: DC
  Administered 2022-12-09: 500 mg via ORAL
  Filled 2022-12-09: qty 1

## 2022-12-09 MED ORDER — ASPIRIN 81 MG PO TBEC
81.0000 mg | DELAYED_RELEASE_TABLET | Freq: Every day | ORAL | Status: DC
  Administered 2022-12-10 – 2022-12-13 (×3): 81 mg via ORAL
  Filled 2022-12-09 (×3): qty 1

## 2022-12-09 MED ORDER — CARVEDILOL 3.125 MG PO TABS
6.2500 mg | ORAL_TABLET | Freq: Every day | ORAL | Status: DC
  Administered 2022-12-10: 6.25 mg via ORAL
  Filled 2022-12-09: qty 2

## 2022-12-09 MED ORDER — INSULIN ASPART 100 UNIT/ML IJ SOLN
0.0000 [IU] | INTRAMUSCULAR | Status: DC
  Administered 2022-12-10 – 2022-12-13 (×5): 1 [IU] via SUBCUTANEOUS

## 2022-12-09 MED ORDER — SODIUM CHLORIDE 0.9 % IV SOLN: Freq: Once | INTRAVENOUS | Status: AC

## 2022-12-09 MED ORDER — ONDANSETRON HCL 4 MG/2ML IJ SOLN
4.0000 mg | Freq: Four times a day (QID) | INTRAMUSCULAR | Status: DC | PRN
  Administered 2022-12-10 – 2022-12-13 (×5): 4 mg via INTRAVENOUS
  Filled 2022-12-09 (×5): qty 2

## 2022-12-09 MED ORDER — SODIUM CHLORIDE 0.9 % IV BOLUS
500.0000 mL | Freq: Once | INTRAVENOUS | Status: AC
  Administered 2022-12-09: 500 mL via INTRAVENOUS

## 2022-12-09 MED ORDER — TICAGRELOR 90 MG PO TABS
90.0000 mg | ORAL_TABLET | Freq: Two times a day (BID) | ORAL | Status: DC
  Administered 2022-12-09 – 2022-12-13 (×7): 90 mg via ORAL
  Filled 2022-12-09 (×7): qty 1

## 2022-12-09 MED ORDER — CARVEDILOL 12.5 MG PO TABS: 12.5000 mg | ORAL_TABLET | Freq: Every day | ORAL | Status: DC

## 2022-12-09 MED ORDER — CARVEDILOL 3.125 MG PO TABS: 6.2500 mg | ORAL_TABLET | ORAL | Status: DC

## 2022-12-09 MED ORDER — IRBESARTAN 75 MG PO TABS
75.0000 mg | ORAL_TABLET | Freq: Every day | ORAL | Status: DC
  Administered 2022-12-10 – 2022-12-13 (×4): 75 mg via ORAL
  Filled 2022-12-09 (×5): qty 1

## 2022-12-09 MED ORDER — ONDANSETRON HCL 4 MG PO TABS: 4.0000 mg | ORAL_TABLET | Freq: Four times a day (QID) | ORAL | Status: DC | PRN

## 2022-12-09 MED ORDER — SODIUM CHLORIDE 0.9% FLUSH
3.0000 mL | Freq: Two times a day (BID) | INTRAVENOUS | Status: DC
  Administered 2022-12-11 – 2022-12-12 (×4): 3 mL via INTRAVENOUS
  Administered 2022-12-13: 10 mL via INTRAVENOUS

## 2022-12-09 MED ORDER — INSULIN GLARGINE-YFGN 100 UNIT/ML ~~LOC~~ SOLN
12.0000 [IU] | Freq: Every day | SUBCUTANEOUS | Status: DC
  Administered 2022-12-10 – 2022-12-11 (×2): 12 [IU] via SUBCUTANEOUS
  Filled 2022-12-09 (×5): qty 0.12

## 2022-12-09 MED ORDER — AZATHIOPRINE 50 MG PO TABS
50.0000 mg | ORAL_TABLET | Freq: Every day | ORAL | Status: DC
  Filled 2022-12-09: qty 1

## 2022-12-09 MED ORDER — SODIUM CHLORIDE 0.9 % IV SOLN: INTRAVENOUS | Status: AC

## 2022-12-09 MED ORDER — HYDROMORPHONE HCL 1 MG/ML IJ SOLN
0.5000 mg | INTRAMUSCULAR | Status: DC | PRN
  Administered 2022-12-10 – 2022-12-13 (×8): 0.5 mg via INTRAVENOUS
  Filled 2022-12-09: qty 0.5
  Filled 2022-12-09: qty 1
  Filled 2022-12-09 (×7): qty 0.5

## 2022-12-09 MED ORDER — ROSUVASTATIN CALCIUM 20 MG PO TABS
40.0000 mg | ORAL_TABLET | Freq: Every day | ORAL | Status: DC
  Administered 2022-12-10 – 2022-12-13 (×3): 40 mg via ORAL
  Filled 2022-12-09 (×3): qty 2

## 2022-12-09 MED ORDER — ENOXAPARIN SODIUM 40 MG/0.4ML IJ SOSY
40.0000 mg | PREFILLED_SYRINGE | Freq: Every day | INTRAMUSCULAR | Status: DC
  Administered 2022-12-11 – 2022-12-13 (×2): 40 mg via SUBCUTANEOUS
  Filled 2022-12-09 (×2): qty 0.4

## 2022-12-09 MED ORDER — METRONIDAZOLE 500 MG PO TABS
500.0000 mg | ORAL_TABLET | Freq: Two times a day (BID) | ORAL | Status: DC
  Administered 2022-12-09: 500 mg via ORAL
  Filled 2022-12-09: qty 1

## 2022-12-09 MED ORDER — MORPHINE SULFATE (PF) 4 MG/ML IV SOLN
4.0000 mg | Freq: Once | INTRAVENOUS | Status: AC
  Administered 2022-12-09: 4 mg via INTRAVENOUS
  Filled 2022-12-09: qty 1

## 2022-12-09 MED ORDER — ACETAMINOPHEN 650 MG RE SUPP: 650.0000 mg | Freq: Four times a day (QID) | RECTAL | Status: DC | PRN

## 2022-12-09 NOTE — ED Notes (Addendum)
Pt alert, NAD, calm, interactive, resps e/u, speaking in clear complete sentences, VSS, BP improved, denies pain, sob, nausea, or dizziness. Pending CXR. Pending lab results, in process. BP 115/85.

## 2022-12-09 NOTE — Progress Notes (Signed)
Daily Session Note  Patient Details  Name: Sean Schaefer MRN: [id number] Date of Birth: December 27, 1965 Referring Provider:   Flowsheet Row INTENSIVE CARDIAC REHAB ORIENT from 11/14/2022 in [facility]  Referring Provider Bryan Lemma, MD       Encounter Date: 12/09/2022  Check In:  Session Check In - 12/09/22 4098       Check-In   Supervising physician immediately available to respond to emergencies [facility] MD immediately available    Physician(s) Bernadene Person NP    Location [facility]    Staff Present Gladstone Lighter, RN, BSN;Johnny Hale Bogus, MS, Exercise Physiologist;David Makemson, MS, ACSM-CEP, CCRP, Exercise Physiologist;Marquavius Scaife Cleophas Dunker, RN, MSN;Bailey Wallace Cullens, MS, Exercise Physiologist    Virtual Visit No    Medication changes reported     Yes    Comments decrease sartan 75 mg and coreg 6.25 mg am, and 12.5 mg pm    Fall or balance concerns reported    No    Tobacco Cessation No Change    Warm-up and Cool-down Performed as group-led instruction   pt sent home due to vomitting night before   Resistance Training Performed Yes    VAD Patient? No      Pain Assessment   Currently in Pain? No/denies    Pain Score 0-No pain    Multiple Pain Sites No             Capillary Blood Glucose: No results found for this or any previous visit (from the past 24 hour(s)).    Social History   Tobacco Use  Smoking Status Never  Smokeless Tobacco Never    Goals Met:  Independence with exercise equipment Exercise tolerated well No report of concerns or symptoms today Strength training completed today  Goals Unmet:  Not Applicable  Comments: Pt tolerated exercise well. Will continue to monitor.   Dr. Armanda Magic is Medical Director for Cardiac Rehab at [facility].

## 2022-12-09 NOTE — Telephone Encounter (Signed)
Pt spouse calling in stating pt has been fatigue and nauseated and believes it is related to some medications he is taking. Please advise.

## 2022-12-09 NOTE — Telephone Encounter (Signed)
Spoke to patient's wife.She stated she is on her way home to check on husband.Stated he has been feeling bad since last Wed 5/15.Stated he has been having nausea and vomiting.Stated he feels like he did when he had his heart attack.  Spoke to patient he stated he presently having chest pain.Feels nauseated.He vomited earlier today.He is getting ready to place a NTG under his tongue.Advised if NTG does not relieve pain he will need to go to ED to be evaluated.Appointment scheduled with Azalee Course PA 5/22 at 3:10 pm.  Spoke to patient he stated he has took 2 NTG with little relief.He continues to have chest pain.His wife is taking him to [facility] ED.I will make Dr.Harding aware.

## 2022-12-09 NOTE — ED Notes (Signed)
Patient transported to CT 

## 2022-12-09 NOTE — ED Triage Notes (Signed)
Pt reports he went to cardiac rehab today; hypotensive; 90s systolic; went home, having CP, N/V; cardiac stent placement march 31st; states feeling similar to prior MI; pt took 5 nitro PTA; pain resolved at this time

## 2022-12-09 NOTE — ED Provider Notes (Signed)
[place] EMERGENCY DEPARTMENT AT [facility] Provider Note   CSN: [id number] Arrival date & time: 12/09/22  1709     History  Chief Complaint  Patient presents with   Chest Pain    Sean Schaefer is a 57 y.o. male.  57 year old male with prior medical history as detailed below presents for evaluation.  Patient reports low blood pressure earlier today while at cardiac rehab.  Shortly thereafter he developed nausea and vomiting.  He complains of associated chest pain with his nausea and vomiting.  He reports multiple episodes of vomiting over the course of the day.  He denies abdominal pain or fever.   The history is provided by the patient and medical records.       Home Medications Prior to Admission medications   Medication Sig Start Date End Date Taking? Authorizing Provider  aspirin EC 81 MG tablet Take 1 tablet (81 mg total) by mouth daily. Swallow whole. 10/23/22   Zannie Cove, MD  azaTHIOprine (IMURAN) 50 MG tablet Take 50 mg by mouth daily.    [provider]  carvedilol (COREG) 12.5 MG tablet Take 6.25 mg ( 1/2 tablet of 12.5 mg)  in the morning and 12.5 mg  in the evening 12/06/22   Marykay Lex, MD  Continuous Glucose Sensor (FREESTYLE LIBRE 14 DAY SENSOR) MISC Apply 1 sensor to the back of upper arm every 14 days 10/23/22     empagliflozin (JARDIANCE) 10 MG TABS tablet Take 1 tablet (10 mg total) by mouth daily. 11/13/22   Joylene Grapes, NP  insulin degludec (TRESIBA FLEXTOUCH) 100 UNIT/ML FlexTouch Pen Inject 38 units into the skin once a day 90 days Patient taking differently: Inject 38 Units into the skin at bedtime. 01/14/22     insulin lispro (HUMALOG) 100 UNIT/ML injection Inject 8 Units into the skin 3 (three) times daily with meals.    [provider]  irbesartan (AVAPRO) 150 MG tablet Take 0.5 tablets (75 mg total) by mouth daily. 12/06/22   Marykay Lex, MD  nitroGLYCERIN (NITROSTAT) 0.4 MG SL tablet Place 1 tablet (0.4  mg total) under the tongue every 5 (five) minutes as needed for chest pain. 11/04/22 02/02/23  Joylene Grapes, NP  rosuvastatin (CRESTOR) 40 MG tablet Take 1 tablet (40 mg total) by mouth daily. 11/13/22   Joylene Grapes, NP  ticagrelor (BRILINTA) 90 MG TABS tablet Take 1 tablet (90 mg) by mouth 2 times daily. 11/13/22 11/13/23  Joylene Grapes, NP      Allergies    Statins    Review of Systems   Review of Systems  All other systems reviewed and are negative.   Physical Exam Updated Vital Signs BP (!) 170/101   Pulse 83   Temp 97.9 F (36.6 C) (Oral)   Resp 16   SpO2 100%  Physical Exam Vitals and nursing note reviewed.  Constitutional:      General: He is not in acute distress.    Appearance: Normal appearance. He is well-developed.  HENT:     Head: Normocephalic and atraumatic.  Eyes:     Conjunctiva/sclera: Conjunctivae normal.     Pupils: Pupils are equal, round, and reactive to light.  Cardiovascular:     Rate and Rhythm: Normal rate and regular rhythm.     Heart sounds: Normal heart sounds.  Pulmonary:     Effort: Pulmonary effort is normal. No respiratory distress.     Breath sounds: Normal breath sounds.  Abdominal:     General: There is no distension.     Palpations: Abdomen is soft.     Tenderness: There is no abdominal tenderness.  Musculoskeletal:        General: No deformity. Normal range of motion.     Cervical back: Normal range of motion and neck supple.  Skin:    General: Skin is warm and dry.  Neurological:     General: No focal deficit present.     Mental Status: He is alert and oriented to person, place, and time.     ED Results / Procedures / Treatments   Labs (all labs ordered are listed, but only abnormal results are displayed) Labs Reviewed  BASIC METABOLIC PANEL - Abnormal; Notable for the following components:      Result Value   Glucose, Bld 183 (*)    All other components within normal limits  CBC - Abnormal; Notable for the following  components:   RBC 4.13 (*)    All other components within normal limits  HEPATIC FUNCTION PANEL - Abnormal; Notable for the following components:   Albumin 3.4 (*)    Total Bilirubin 1.4 (*)    Indirect Bilirubin 1.2 (*)    All other components within normal limits  LIPASE, BLOOD  TROPONIN I (HIGH SENSITIVITY)  TROPONIN I (HIGH SENSITIVITY)    EKG None  Radiology DG Chest 2 View  Result Date: 12/09/2022 CLINICAL DATA:  Chest pain.  Three EXAM: CHEST - 2 VIEW COMPARISON:  3024 FINDINGS: The heart size and mediastinal contours are within normal limits. Both lungs are clear. The visualized skeletal structures are unremarkable. IMPRESSION: No active cardiopulmonary disease. Electronically Signed   By: Norva Pavlov M.D.   On: 12/09/2022 18:20    Procedures Procedures    Medications Ordered in ED Medications  promethazine (PHENERGAN) 12.5 mg in sodium chloride 0.9 % 50 mL IVPB (has no administration in time range)  sodium chloride 0.9 % bolus 500 mL (0 mLs Intravenous Stopped 12/09/22 1915)  ondansetron (ZOFRAN) injection 4 mg (4 mg Intravenous Given 12/09/22 1851)  morphine (PF) 4 MG/ML injection 4 mg (4 mg Intravenous Given 12/09/22 1946)  ondansetron (ZOFRAN) injection 4 mg (4 mg Intravenous Given 12/09/22 2029)  0.9 %  sodium chloride infusion ( Intravenous New Bag/Given 12/09/22 2029)    ED Course/ Medical Decision Making/ A&P                             Medical Decision Making Amount and/or Complexity of Data Reviewed Labs: ordered. Radiology: ordered.  Risk Prescription drug management. Decision regarding hospitalization.    Medical Screen Complete  This patient presented to the ED with complaint of chest pain, nausea, vomiting.  This complaint involves an extensive number of treatment options. The initial differential diagnosis includes, but is not limited to, ACS, metabolic abnormality, Crohn's exacerbation, etc.  This presentation is: Acute, Chronic,  Self-Limited, Previously Undiagnosed, Uncertain Prognosis, Complicated, Systemic Symptoms, and Threat to Life/Bodily Function  Patient presents with complaint of nausea, vomiting, chest pain.  Patient with history of NSTEMI status post recent intervention with cardiology.  Patient with also extensive history of Crohn's.  Patient with several episodes of vomiting just prior to and during ED evaluation.  He also developed significant diarrhea while in the ED.  EKG is without evidence of acute ischemia.  Troponin x 2 is 9 and then 8.  Case discussed briefly with cardiology fellow -  Dr. Hyacinth Meeker.  He agrees with ED management.  Cardiology happy to consult if called by admitting team.  Patient with continued nausea and vomiting despite Zofran and Phenergan.  CT abdomen pelvis is without acute abnormality.  Patient would benefit from admission.  Hospitalist service made aware of case and will evaluate for same.    Additional history obtained:  External records from outside sources obtained and reviewed including prior ED visits and prior Inpatient records.    Lab Tests:  I ordered and personally interpreted labs.     Imaging Studies ordered:  I ordered imaging studies including ct ap  I independently visualized and interpreted obtained imaging which showed NAD I agree with the radiologist interpretation.   Cardiac Monitoring:  The patient was maintained on a cardiac monitor.  I personally viewed and interpreted the cardiac monitor which showed an underlying rhythm of: Sinus tach then NSR   Medicines ordered:  I ordered medication including Zofran, morphine, Phenergan, IV fluids for chest pain, nausea, vomiting Reevaluation of the patient after these medicines showed that the patient: improved   Problem List / ED Course:  Nausea, vomiting, chest pain   Reevaluation:  After the interventions noted above, I reevaluated the patient and found that they have:  improved  Disposition:  After consideration of the diagnostic results and the patients response to treatment, I feel that the patent would benefit from admission.          Final Clinical Impression(s) / ED Diagnoses Final diagnoses:  Chest pain, unspecified type  Nausea and vomiting, unspecified vomiting type    Rx / DC Orders ED Discharge Orders     None         Wynetta Fines, MD 12/09/22 2337

## 2022-12-09 NOTE — Progress Notes (Addendum)
Daily Session Note  Patient Details  Name: Sean Schaefer MRN: [id number] Date of Birth: 09-17-1965 Referring Provider:   Flowsheet Row INTENSIVE CARDIAC REHAB ORIENT from 11/14/2022 in [facility]  Referring Provider Bryan Lemma, MD       Encounter Date: 12/09/2022  Check In:  Session Check In - 12/09/22 4098       Check-In   Supervising physician immediately available to respond to emergencies [facility] MD immediately available    Physician(s) Bernadene Person NP    Location [facility]    Staff Present Gladstone Lighter, RN, BSN;Johnny Hale Bogus, MS, Exercise Physiologist;David Makemson, MS, ACSM-CEP, CCRP, Exercise Physiologist;Sarah Cleophas Dunker, RN, MSN;Bailey Wallace Cullens, MS, Exercise Physiologist    Virtual Visit No    Medication changes reported     Yes    Comments decrease sartan 75 mg and coreg 6.25 mg am, and 12.5 mg pm    Fall or balance concerns reported    No    Tobacco Cessation No Change    Warm-up and Cool-down Performed as group-led instruction   pt sent home due to vomitting night before   Resistance Training Performed Yes    VAD Patient? No      Pain Assessment   Currently in Pain? No/denies    Pain Score 0-No pain    Multiple Pain Sites No             Capillary Blood Glucose: No results found for this or any previous visit (from the past 24 hour(s)).    Social History   Tobacco Use  Smoking Status Never  Smokeless Tobacco Never    Goals Met:  Exercise tolerated well No report of concerns or symptoms today Strength training completed today  Goals Unmet:  Not Applicable  Comments: Post exercise BP 87/63 asymptomatic.  Ate breakfast and drank 2 16 ounce bottles of water.  Took am medications with the adjusted dose for the Carvediol to 6.25mg  and Irbesaran to 75mg .  Instructed last week to decrease the medications to 1/2.  Rechecked BP 101/68 sitting.  Noted this arm differs from last week  due to the placement of his CGM.  Standing bp 87/60 asymptomatic.  Additional water given standing bp on Right arm was 93/63.  Pt remained asymptomatic.   Asked pt to hold his BP medications until after exercise and to continue to monitor his bp and report any symptoms to the provider. Karlene Lineman RN, BSN Cardiac and Pulmonary Rehab Nurse Navigator     Dr. Armanda Magic is Medical Director for Cardiac Rehab at [facility].

## 2022-12-09 NOTE — H&P (Signed)
History and Physical    Sean Schaefer:[id number] DOB: 1966-07-09 DOA: 12/09/2022  PCP: Renford Dills, MD   Patient coming from: Home   Chief Complaint: N/V, chest pain   HPI: Sean Schaefer is a pleasant 57 y.o. male with medical history significant for Crohn disease with fistula, insulin-dependent diabetes mellitus, hypertension, and CAD with recent NSTEMI who presents with nausea, vomiting, and chest pain.  Patient has been experiencing nausea for close to a week now, went to cardiac rehab today where he was exercising without any angina, but then experienced recurrent episodes of nonbloody vomiting with central chest discomfort while at rest.  He took 2 nitroglycerin without much change in the chest discomfort, called his cardiology clinic, and was advised to seek evaluation in the ED given persistent discomfort.  He went on to take 3 more nitroglycerin prior to coming into the ED.  He denies abdominal pain, change in his chronic diarrhea, melena or hematochezia, fever, or chills.  ED Course: Upon arrival to the ED, patient is found to be afebrile and saturating well on room air with elevated heart rate and labile blood pressure.  EKG demonstrates sinus tachycardia and chest x-ray is negative for acute cardiopulmonary disease.  CT of the abdomen and pelvis is notable for postoperative changes of colectomy and ileoanal pouch with findings suggestive of pouchitis.  Labs are notable for preserved renal function, normal WBC, and normal troponin x 2.  ED physician reviewed the case with cardiology and administered IV fluids, antiemetics, and analgesics.  Review of Systems:  All other systems reviewed and apart from HPI, are negative.  Past Medical History:  Diagnosis Date   Crohn's disease (HCC)    Diabetes mellitus without complication (HCC)    Hypertension    Ulcerative colitis (HCC)     Past Surgical History:  Procedure Laterality Date   COLON SURGERY     COLONOSCOPY      CORONARY BALLOON ANGIOPLASTY N/A 10/20/2022   Procedure: CORONARY BALLOON ANGIOPLASTY;  Surgeon: Marykay Lex, MD;  Location: [facility] INVASIVE CV LAB;  Service: Cardiovascular;  Laterality: N/A;   CORONARY STENT INTERVENTION N/A 10/20/2022   Procedure: CORONARY STENT INTERVENTION;  Surgeon: Marykay Lex, MD;  Location: [facility] INVASIVE CV LAB;  Service: Cardiovascular;  Laterality: N/A;   EYE SURGERY Left    FLEXIBLE SIGMOIDOSCOPY N/A 02/05/2021   Procedure: FLEXIBLE POUCHOSCOPY WITH BIOPSY;  Surgeon: Andria Meuse, MD;  Location: [facility] ORS;  Service: General;  Laterality: N/A;   INCISION AND DRAINAGE ABSCESS N/A 02/05/2021   Procedure: INCISION AND DRAINAGE OF PERIANAL ABSCESS;  Surgeon: Andria Meuse, MD;  Location: [facility];  Service: General;  Laterality: N/A;   INCISION AND DRAINAGE PERIRECTAL ABSCESS N/A 11/30/2020   Procedure: IRRIGATION AND DEBRIDEMENT PERIRECTAL ABSCESS;  Surgeon: Fritzi Mandes, MD;  Location: [facility] OR;  Service: General;  Laterality: N/A;   INCISION AND DRAINAGE PERIRECTAL ABSCESS Left 04/03/2021   Procedure: left peri-rectal/gluteal abscess;  Surgeon: Diamantina Monks, MD;  Location: [facility] OR;  Service: General;  Laterality: Left;   LEFT HEART CATH AND CORONARY ANGIOGRAPHY N/A 10/20/2022   Procedure: LEFT HEART CATH AND CORONARY ANGIOGRAPHY;  Surgeon: Marykay Lex, MD;  Location: [facility] INVASIVE CV LAB;  Service: Cardiovascular;  Laterality: N/A;   Perianal fistula repair  07/22/2010   PLACEMENT OF SETON N/A 02/05/2021   Procedure: PLACEMENT OF SETONS X2;  Surgeon: Andria Meuse, MD;  Location: [facility] ORS;  Service: General;  Laterality: N/A;  RECTAL EXAM UNDER ANESTHESIA N/A 02/05/2021   Procedure: ANORECTAL EXAM UNDER ANESTHESIA;  Surgeon: Andria Meuse, MD;  Location: [facility];  Service: General;  Laterality: N/A;   TESTICLE REMOVAL Left 2000    Social History:   reports that he has never smoked. He has never used smokeless tobacco. He reports that he  does not drink alcohol and does not use drugs.  Allergies  Allergen Reactions   Statins Other (See Comments)    Myalgias  Currently taking Crestor 10mg .    Family History  Problem Relation Age of Onset   Hypertension Mother    Cancer Mother    Hypertension Father    Inflammatory bowel disease Brother    Diabetes Paternal Uncle    Diabetes Paternal Grandmother    Diabetes Paternal Grandfather    Heart disease Neg Hx    Colon cancer Neg Hx    Esophageal cancer Neg Hx    Rectal cancer Neg Hx    Stomach cancer Neg Hx      Prior to Admission medications   Medication Sig Start Date End Date Taking? Authorizing Provider  aspirin EC 81 MG tablet Take 1 tablet (81 mg total) by mouth daily. Swallow whole. 10/23/22   Zannie Cove, MD  azaTHIOprine (IMURAN) 50 MG tablet Take 50 mg by mouth daily.    [provider]  carvedilol (COREG) 12.5 MG tablet Take 6.25 mg ( 1/2 tablet of 12.5 mg)  in the morning and 12.5 mg  in the evening 12/06/22   Marykay Lex, MD  Continuous Glucose Sensor (FREESTYLE LIBRE 14 DAY SENSOR) MISC Apply 1 sensor to the back of upper arm every 14 days 10/23/22     empagliflozin (JARDIANCE) 10 MG TABS tablet Take 1 tablet (10 mg total) by mouth daily. 11/13/22   Joylene Grapes, NP  insulin degludec (TRESIBA FLEXTOUCH) 100 UNIT/ML FlexTouch Pen Inject 38 units into the skin once a day 90 days Patient taking differently: Inject 38 Units into the skin at bedtime. 01/14/22     insulin lispro (HUMALOG) 100 UNIT/ML injection Inject 8 Units into the skin 3 (three) times daily with meals.    [provider]  irbesartan (AVAPRO) 150 MG tablet Take 0.5 tablets (75 mg total) by mouth daily. 12/06/22   Marykay Lex, MD  nitroGLYCERIN (NITROSTAT) 0.4 MG SL tablet Place 1 tablet (0.4 mg total) under the tongue every 5 (five) minutes as needed for chest pain. 11/04/22 02/02/23  Joylene Grapes, NP  rosuvastatin (CRESTOR) 40 MG tablet Take 1 tablet (40 mg total) by  mouth daily. 11/13/22   Joylene Grapes, NP  ticagrelor (BRILINTA) 90 MG TABS tablet Take 1 tablet (90 mg) by mouth 2 times daily. 11/13/22 11/13/23  Joylene Grapes, NP    Physical Exam: Vitals:   12/09/22 2215 12/10/22 0058 12/10/22 0200 12/10/22 0230  BP: 137/83 (!) 124/51 (!) 163/68 (!) 181/108  Pulse: 92 97 98 95  Resp: 12 16 (!) 7 16  Temp:  98.2 F (36.8 C)    TempSrc:  Oral    SpO2: 100% 100% 100% 100%    Constitutional: NAD, calm  Eyes: PERTLA, lids and conjunctivae normal ENMT: Mucous membranes are moist. Posterior pharynx clear of any exudate or lesions.   Neck: supple, no masses  Respiratory: no wheezing, no crackles. No accessory muscle use.  Cardiovascular: S1 & S2 heard, regular rate and rhythm. No extremity edema.   Abdomen: No distension, soft, no tenderness. Bowel  sounds active.  Musculoskeletal: no clubbing / cyanosis. No joint deformity upper and lower extremities.   Skin: no significant rashes, lesions, ulcers. Warm, dry, well-perfused. Neurologic: CN 2-12 grossly intact. Moving all extremities. Alert and oriented.  Psychiatric: Pleasant. Cooperative.    Labs and Imaging on Admission: I have personally reviewed following labs and imaging studies  CBC: Recent Labs  Lab 12/09/22 1730 12/10/22 0131  WBC 5.2 5.7  HGB 13.4 12.7*  HCT 39.9 37.9*  MCV 96.6 98.2  PLT 172 154   Basic Metabolic Panel: Recent Labs  Lab 12/09/22 1730  NA 135  K 3.9  CL 99  CO2 22  GLUCOSE 183*  BUN 19  CREATININE 1.17  CALCIUM 9.3   GFR: Estimated Creatinine Clearance: 82 mL/min (by C-G formula based on SCr of 1.17 mg/dL). Liver Function Tests: Recent Labs  Lab 12/09/22 1858  AST 30  ALT 23  ALKPHOS 48  BILITOT 1.4*  PROT 7.7  ALBUMIN 3.4*   Recent Labs  Lab 12/09/22 1858  LIPASE 40   No results for input(s): "AMMONIA" in the last 168 hours. Coagulation Profile: No results for input(s): "INR", "PROTIME" in the last 168 hours. Cardiac Enzymes: No results  for input(s): "CKTOTAL", "CKMB", "CKMBINDEX", "TROPONINI" in the last 168 hours. BNP (last 3 results) No results for input(s): "PROBNP" in the last 8760 hours. HbA1C: No results for input(s): "HGBA1C" in the last 72 hours. CBG: Recent Labs  Lab 12/09/22 2356  GLUCAP 83   Lipid Profile: No results for input(s): "CHOL", "HDL", "LDLCALC", "TRIG", "CHOLHDL", "LDLDIRECT" in the last 72 hours. Thyroid Function Tests: No results for input(s): "TSH", "T4TOTAL", "FREET4", "T3FREE", "THYROIDAB" in the last 72 hours. Anemia Panel: No results for input(s): "VITAMINB12", "FOLATE", "FERRITIN", "TIBC", "IRON", "RETICCTPCT" in the last 72 hours. Urine analysis:    Component Value Date/Time   COLORURINE YELLOW 10/19/2022 1955   APPEARANCEUR CLEAR 10/19/2022 1955   LABSPEC 1.026 10/19/2022 1955   PHURINE 5.0 10/19/2022 1955   GLUCOSEU >=500 (A) 10/19/2022 1955   HGBUR SMALL (A) 10/19/2022 1955   BILIRUBINUR NEGATIVE 10/19/2022 1955   KETONESUR 5 (A) 10/19/2022 1955   PROTEINUR 100 (A) 10/19/2022 1955   NITRITE NEGATIVE 10/19/2022 1955   LEUKOCYTESUR NEGATIVE 10/19/2022 1955   Sepsis Labs: @LABRCNTIP (procalcitonin:4,lacticidven:4) )No results found for this or any previous visit (from the past 240 hour(s)).   Radiological Exams on Admission: CT ABDOMEN PELVIS W CONTRAST  Result Date: 12/09/2022 CLINICAL DATA:  Crohn's exacerbation. EXAM: CT ABDOMEN AND PELVIS WITH CONTRAST TECHNIQUE: Multidetector CT imaging of the abdomen and pelvis was performed using the standard protocol following bolus administration of intravenous contrast. RADIATION DOSE REDUCTION: This exam was performed according to the departmental dose-optimization program which includes automated exposure control, adjustment of the mA and/or kV according to patient size and/or use of iterative reconstruction technique. CONTRAST:  75mL OMNIPAQUE IOHEXOL 350 MG/ML SOLN COMPARISON:  MRI pelvis 12/31/2021; MR enterography 07/14/2022; CT  pelvis 04/03/2021 FINDINGS: Lower chest: No acute abnormality. Hepatobiliary: Cholelithiasis without evidence of cholecystitis. Unremarkable liver. No biliary dilation. Pancreas: Unremarkable. Spleen: Unremarkable. Adrenals/Urinary Tract: Normal adrenal glands. No urinary calculi or hydronephrosis. Unremarkable bladder. 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 Small stool ball in the ileoanal pouch with mild wall thickening an adjacent stranding. No evidence of obstruction. No additional bowel wall thickening. Stomach is within normal limits. Vascular/Lymphatic: Subcentimeter perirectal lymph nodes are likely reactive. No acute vascular abnormality. Reproductive: Enlarged prostate. Other: No free intraperitoneal air. Partially visualized setons within  the left perianal region where there is soft tissue thickening in the region of the previously seen abscess in perianal fistula. Musculoskeletal: No acute osseous abnormality. IMPRESSION: 1. 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 2. Partially visualized perianal fistula.  No visible abscess. 3. Cholelithiasis without evidence of cholecystitis. Electronically Signed   By: Minerva Fester M.D.   On: 12/09/2022 22:55   DG Chest 2 View  Result Date: 12/09/2022 CLINICAL DATA:  Chest pain.  Three EXAM: CHEST - 2 VIEW COMPARISON:  3024 FINDINGS: The heart size and mediastinal contours are within normal limits. Both lungs are clear. The visualized skeletal structures are unremarkable. IMPRESSION: No active cardiopulmonary disease. Electronically Signed   By: Norva Pavlov M.D.   On: 12/09/2022 18:20    EKG: Independently reviewed. Sinus tachycardia, rate 121.   Assessment/Plan:   1. Intractable N/V  - Presents with N/V since 5/15 that has become intractable  - No abdominal pain, exam is benign, and CT notable only for pouchitis  - Continue antiemetics, IVF  hydration, and monitoring of electrolytes; advance diet as he improves   2. Chest pain; CAD  - Hx NSTEMI on 3/32/24 treated with PTCA to LCx-OM and DES to proximal LAD  - Exercised today at cardiac rehab without chest discomfort but then developed central chest discomfort in setting of recurrent N/V  - Troponin is normal x2 and no acute ischemic features noted on EKG; ED physician reviewed with cards fellow  - Continue DAPT, statin, and beta-blocker, continue treatment of GI issues that are likely cause of chest discomfort    3. Crohn disease with fistula; pouchitis  - CT findings suggestive of pouchitis  - Continue Imuran, start Cipro and Flagyl    4. Insulin-dependent DM  - A1c was 12.1% in April 2024  - Check CBGs and continue long- and short-acting insulin     DVT prophylaxis: Lovenox  Code Status: Full  Level of Care: Level of care: Telemetry Cardiac Family Communication: Wife at bedside  Disposition Plan:  Patient is from: home  Anticipated d/c is to: Home  Anticipated d/c date is: 5/21 or 12/11/22  Patient currently: Pending control of N/V, tolerance of adequate oral intake  Consults called: None  Admission status: Observation     Briscoe Deutscher, MD [facility]  12/10/2022, 2:58 AM

## 2022-12-09 NOTE — ED Provider Triage Note (Signed)
Emergency Medicine Provider Triage Evaluation Note  Sean Schaefer , a 57 y.o. male  was evaluated in triage.  Patient presenting with chest pain.  He had an NSTEMI with stent placement at the end of March.  Says that he was having chest pain that was reminiscent of his MI.  Has been doing cardiac rehab.  Took 5 nitroglycerin prior to arrival and now has a blood pressure of 80s over 60s.  Tachycardic to the 120s.  Says that it helped the pain a lot  Review of Systems  Positive:  Negative:   Physical Exam  BP (!) 85/67   Pulse (!) 120   Temp 97.9 F (36.6 C) (Oral)   Resp 18   SpO2 99%  Gen:   Awake, no distress   Resp:  Normal effort  MSK:   Moves extremities without difficulty  Other:  RRR  Medical Decision Making  Medically screening exam initiated at 5:19 PM.  Appropriate orders placed.  Sean Schaefer was informed that the remainder of the evaluation will be completed by another provider, this initial triage assessment does not replace that evaluation, and the importance of remaining in the ED until their evaluation is complete.  Charge and triage nurse made aware that the patient needs the next room   Carnita Golob A, PA-C 12/09/22 1720

## 2022-12-09 NOTE — ED Notes (Signed)
Ambulated to the restroom independently.

## 2022-12-10 ENCOUNTER — Encounter (HOSPITAL_COMMUNITY): Payer: Self-pay | Admitting: Family Medicine

## 2022-12-10 DIAGNOSIS — Z833 Family history of diabetes mellitus: Secondary | ICD-10-CM | POA: Diagnosis not present

## 2022-12-10 DIAGNOSIS — Z794 Long term (current) use of insulin: Secondary | ICD-10-CM | POA: Diagnosis not present

## 2022-12-10 DIAGNOSIS — Z7902 Long term (current) use of antithrombotics/antiplatelets: Secondary | ICD-10-CM | POA: Diagnosis not present

## 2022-12-10 DIAGNOSIS — K449 Diaphragmatic hernia without obstruction or gangrene: Secondary | ICD-10-CM | POA: Diagnosis not present

## 2022-12-10 DIAGNOSIS — E785 Hyperlipidemia, unspecified: Secondary | ICD-10-CM | POA: Diagnosis not present

## 2022-12-10 DIAGNOSIS — K2100 Gastro-esophageal reflux disease with esophagitis, without bleeding: Secondary | ICD-10-CM | POA: Diagnosis not present

## 2022-12-10 DIAGNOSIS — K50913 Crohn's disease, unspecified, with fistula: Secondary | ICD-10-CM | POA: Diagnosis not present

## 2022-12-10 DIAGNOSIS — R1115 Cyclical vomiting syndrome unrelated to migraine: Secondary | ICD-10-CM | POA: Diagnosis not present

## 2022-12-10 DIAGNOSIS — R079 Chest pain, unspecified: Secondary | ICD-10-CM | POA: Diagnosis not present

## 2022-12-10 DIAGNOSIS — E11649 Type 2 diabetes mellitus with hypoglycemia without coma: Secondary | ICD-10-CM | POA: Diagnosis not present

## 2022-12-10 DIAGNOSIS — E1165 Type 2 diabetes mellitus with hyperglycemia: Secondary | ICD-10-CM | POA: Diagnosis not present

## 2022-12-10 DIAGNOSIS — R112 Nausea with vomiting, unspecified: Secondary | ICD-10-CM | POA: Diagnosis not present

## 2022-12-10 DIAGNOSIS — I2510 Atherosclerotic heart disease of native coronary artery without angina pectoris: Secondary | ICD-10-CM | POA: Diagnosis not present

## 2022-12-10 DIAGNOSIS — E119 Type 2 diabetes mellitus without complications: Secondary | ICD-10-CM | POA: Diagnosis not present

## 2022-12-10 DIAGNOSIS — Z955 Presence of coronary angioplasty implant and graft: Secondary | ICD-10-CM | POA: Diagnosis not present

## 2022-12-10 DIAGNOSIS — K224 Dyskinesia of esophagus: Secondary | ICD-10-CM | POA: Diagnosis present

## 2022-12-10 DIAGNOSIS — K91850 Pouchitis: Secondary | ICD-10-CM | POA: Diagnosis not present

## 2022-12-10 DIAGNOSIS — Z8249 Family history of ischemic heart disease and other diseases of the circulatory system: Secondary | ICD-10-CM | POA: Diagnosis not present

## 2022-12-10 DIAGNOSIS — I10 Essential (primary) hypertension: Secondary | ICD-10-CM | POA: Diagnosis not present

## 2022-12-10 DIAGNOSIS — I959 Hypotension, unspecified: Secondary | ICD-10-CM | POA: Diagnosis not present

## 2022-12-10 DIAGNOSIS — R0789 Other chest pain: Secondary | ICD-10-CM

## 2022-12-10 DIAGNOSIS — K8020 Calculus of gallbladder without cholecystitis without obstruction: Secondary | ICD-10-CM | POA: Diagnosis present

## 2022-12-10 DIAGNOSIS — Z79899 Other long term (current) drug therapy: Secondary | ICD-10-CM | POA: Diagnosis not present

## 2022-12-10 DIAGNOSIS — Z7982 Long term (current) use of aspirin: Secondary | ICD-10-CM | POA: Diagnosis not present

## 2022-12-10 DIAGNOSIS — Z7984 Long term (current) use of oral hypoglycemic drugs: Secondary | ICD-10-CM | POA: Diagnosis not present

## 2022-12-10 DIAGNOSIS — I252 Old myocardial infarction: Secondary | ICD-10-CM | POA: Diagnosis not present

## 2022-12-10 DIAGNOSIS — K50813 Crohn's disease of both small and large intestine with fistula: Secondary | ICD-10-CM | POA: Diagnosis not present

## 2022-12-10 DIAGNOSIS — Z888 Allergy status to other drugs, medicaments and biological substances status: Secondary | ICD-10-CM | POA: Diagnosis not present

## 2022-12-10 LAB — CBC
HCT: 37.9 % — ABNORMAL LOW (ref 39.0–52.0)
Hemoglobin: 12.7 g/dL — ABNORMAL LOW (ref 13.0–17.0)
MCH: 32.9 pg (ref 26.0–34.0)
MCHC: 33.5 g/dL (ref 30.0–36.0)
MCV: 98.2 fL (ref 80.0–100.0)
Platelets: 154 10*3/uL (ref 150–400)
RBC: 3.86 MIL/uL — ABNORMAL LOW (ref 4.22–5.81)
RDW: 14.3 % (ref 11.5–15.5)
WBC: 5.7 10*3/uL (ref 4.0–10.5)
nRBC: 0 % (ref 0.0–0.2)

## 2022-12-10 LAB — CBG MONITORING, ED
Glucose-Capillary: 84 mg/dL (ref 70–99)
Glucose-Capillary: 75 mg/dL (ref 70–99)
Glucose-Capillary: 98 mg/dL (ref 70–99)

## 2022-12-10 LAB — COMPREHENSIVE METABOLIC PANEL
ALT: 20 U/L (ref 0–44)
AST: 21 U/L (ref 15–41)
Albumin: 3.3 g/dL — ABNORMAL LOW (ref 3.5–5.0)
Alkaline Phosphatase: 47 U/L (ref 38–126)
Anion gap: 14 (ref 5–15)
BUN: 17 mg/dL (ref 6–20)
CO2: 19 mmol/L — ABNORMAL LOW (ref 22–32)
Calcium: 8.8 mg/dL — ABNORMAL LOW (ref 8.9–10.3)
Chloride: 106 mmol/L (ref 98–111)
Creatinine, Ser: 1.03 mg/dL (ref 0.61–1.24)
GFR, Estimated: >60 mL/min (ref >60)
Glucose, Bld: 88 mg/dL (ref 70–99)
Potassium: 4.2 mmol/L (ref 3.5–5.1)
Sodium: 139 mmol/L (ref 135–145)
Total Bilirubin: 1.4 mg/dL — ABNORMAL HIGH (ref 0.3–1.2)
Total Protein: 7.5 g/dL (ref 6.5–8.1)

## 2022-12-10 LAB — MAGNESIUM: Magnesium: 2 mg/dL (ref 1.7–2.4)

## 2022-12-10 LAB — GLUCOSE, CAPILLARY
Glucose-Capillary: 103 mg/dL — ABNORMAL HIGH (ref 70–99)
Glucose-Capillary: 139 mg/dL — ABNORMAL HIGH (ref 70–99)

## 2022-12-10 LAB — HIV ANTIBODY (ROUTINE TESTING W REFLEX): HIV Screen 4th Generation wRfx: NONREACTIVE

## 2022-12-10 MED ORDER — FAMOTIDINE IN NACL 20-0.9 MG/50ML-% IV SOLN
20.0000 mg | Freq: Two times a day (BID) | INTRAVENOUS | Status: DC
  Administered 2022-12-10 – 2022-12-12 (×4): 20 mg via INTRAVENOUS
  Filled 2022-12-10 (×5): qty 50

## 2022-12-10 MED ORDER — AZATHIOPRINE 50 MG PO TABS
150.0000 mg | ORAL_TABLET | Freq: Every day | ORAL | Status: DC
  Administered 2022-12-10 – 2022-12-13 (×3): 150 mg via ORAL
  Filled 2022-12-10 (×5): qty 3

## 2022-12-10 MED ORDER — SODIUM CHLORIDE 0.9 % IV SOLN: INTRAVENOUS | Status: AC

## 2022-12-10 MED ORDER — CARVEDILOL 12.5 MG PO TABS
12.5000 mg | ORAL_TABLET | Freq: Two times a day (BID) | ORAL | Status: DC
  Administered 2022-12-10 – 2022-12-13 (×5): 12.5 mg via ORAL
  Filled 2022-12-10 (×5): qty 1

## 2022-12-10 NOTE — ED Notes (Signed)
Patient vomiting at this time.

## 2022-12-10 NOTE — ED Notes (Signed)
Patient continues to c/o nausea, MD made aware

## 2022-12-10 NOTE — ED Notes (Signed)
Pt AxOx4. Eating meal

## 2022-12-10 NOTE — Consult Note (Addendum)
Cardiology Consultation   Patient ID: KADO CARITHERS MRN: [id number]; DOB: October 03, 1965  Admit date: 12/09/2022 Date of Consult: 12/10/2022  PCP:  Renford Dills, MD   [facility] Providers Cardiologist:  Bryan Lemma, MD   {   Patient Profile:   Sean Schaefer is a 57 y.o. male with a hx of CAD s/p NSTEMI, DES-pLAD, PTCA-pLCx, hypertension, hyperlipidemia, Crohn's disease, dilation of the ascending aorta (43mm by echo 10/2022) and insulin-dependent type 2 diabetes who is being seen 12/10/2022 for the evaluation of chest pain at the request of Dr. Jerral Ralph.  History of Present Illness:   [name] has cardiac history significant for multivessel disease and a recent admission in March 2024 for NSTEMI. Patient underwent cardiac catheterization with DES to the proximal LAD and PTCA to the proximal left circumflex.  He had nonobstructive residual disease in the LAD (60% mid, 30% distal), RCA (25% prox, 20% distal, 60% inferior septal branch) managed medically. Echocardiogram during that admission indicated hyperdynamic function of 70 to 75%, mild LVH, no regional wall motion abnormalities and moderate dilatation of aorta measuring 43 mm.  He was discharged on aspirin and Brilinta.  Patient was seen in follow-up later in April with some complaints of rectal bleeding that was related to his Crohn's.  At the time he was reported to have no anginal symptoms and was able to return to work in his normal activities.   Patient now has been admitted for episodes of vomiting associated with some chest discomfort.  He states that last Thursday after receiving his infusion of his Remicade for his Crohn's he had an episode of vomiting. He went to cardiac rehab and was noted to be hypotensive so session was deferred. His carvedilol was decreased to 1/2 tablet in AM, irbesartan dose cut in half. He felt fine after that but then yesterday went back to cardiac rehab. He was able to participate in the session  without any angina though BP continued to be on the soft side. An hour after exercising and returning back home he started to feel some chest discomfort and then started vomiting again. He denies any radiation with this pain, it is nonexertional, feels heavy, and rates it 2 out of 10.  At that point patient had taken nitroglycerin and found some relief.  Patient states that he took 5 nitroglycerin yesterday. He then called the cardiology clinic because he felt that the pain was similar to his heart attack.  His office recommended emergency evaluation. Aside from these episodes of chest discomfort associated with vomiting, patient has had no chest pain complaints since his catheterization.    Upon arrival he was tachycardic (sinus tach in the 120s) and hypotensive in the 85/67. EKGs have all shown NSR without any ischemic changes. Troponins have been negative. CT of the abdomen suggests pouchitis. He was initially given antibiotics however it is felt that his findings are more associated with inflammation rather than infection. He subsequently has swung hypertensive. He's been given multiple antiemetics without any improvement in vomiting. He continues to feel ongoing 1-2/10 chest pain therefore cardiology asked to see. He is being started on Pepcid, IV fluids and clear liquid diet.   Past Medical History:  Diagnosis Date   CAD (coronary artery disease)    Crohn's disease (HCC)    Diabetes mellitus (HCC)    Dilation of aorta (HCC)    Hypertension    Ulcerative colitis (HCC)     Past Surgical History:  Procedure Laterality Date  COLON SURGERY     COLONOSCOPY     CORONARY BALLOON ANGIOPLASTY N/A 10/20/2022   Procedure: CORONARY BALLOON ANGIOPLASTY;  Surgeon: Marykay Lex, MD;  Location: [facility] INVASIVE CV LAB;  Service: Cardiovascular;  Laterality: N/A;   CORONARY STENT INTERVENTION N/A 10/20/2022   Procedure: CORONARY STENT INTERVENTION;  Surgeon: Marykay Lex, MD;  Location: [facility] INVASIVE CV  LAB;  Service: Cardiovascular;  Laterality: N/A;   EYE SURGERY Left    FLEXIBLE SIGMOIDOSCOPY N/A 02/05/2021   Procedure: FLEXIBLE POUCHOSCOPY WITH BIOPSY;  Surgeon: Andria Meuse, MD;  Location: [facility] ORS;  Service: General;  Laterality: N/A;   INCISION AND DRAINAGE ABSCESS N/A 02/05/2021   Procedure: INCISION AND DRAINAGE OF PERIANAL ABSCESS;  Surgeon: Andria Meuse, MD;  Location: [facility] ORS;  Service: General;  Laterality: N/A;   INCISION AND DRAINAGE PERIRECTAL ABSCESS N/A 11/30/2020   Procedure: IRRIGATION AND DEBRIDEMENT PERIRECTAL ABSCESS;  Surgeon: Fritzi Mandes, MD;  Location: [facility] OR;  Service: General;  Laterality: N/A;   INCISION AND DRAINAGE PERIRECTAL ABSCESS Left 04/03/2021   Procedure: left peri-rectal/gluteal abscess;  Surgeon: Diamantina Monks, MD;  Location: [facility] OR;  Service: General;  Laterality: Left;   LEFT HEART CATH AND CORONARY ANGIOGRAPHY N/A 10/20/2022   Procedure: LEFT HEART CATH AND CORONARY ANGIOGRAPHY;  Surgeon: Marykay Lex, MD;  Location: [facility] INVASIVE CV LAB;  Service: Cardiovascular;  Laterality: N/A;   Perianal fistula repair  07/22/2010   PLACEMENT OF SETON N/A 02/05/2021   Procedure: PLACEMENT OF SETONS X2;  Surgeon: Andria Meuse, MD;  Location: [facility] ORS;  Service: General;  Laterality: N/A;   RECTAL EXAM UNDER ANESTHESIA N/A 02/05/2021   Procedure: ANORECTAL EXAM UNDER ANESTHESIA;  Surgeon: Andria Meuse, MD;  Location: [facility] ORS;  Service: General;  Laterality: N/A;   TESTICLE REMOVAL Left 2000     Inpatient Medications: Scheduled Meds:  aspirin EC  81 mg Oral Daily   azaTHIOprine  150 mg Oral Daily   carvedilol  6.25 mg Oral Q breakfast   And   carvedilol  12.5 mg Oral QAC supper   enoxaparin (LOVENOX) injection  40 mg Subcutaneous Daily   insulin aspart  0-9 Units Subcutaneous Q4H   insulin glargine-yfgn  12 Units Subcutaneous QHS   Insulin Pen Needle  1 packet Subcutaneous QHS   irbesartan  75 mg Oral Daily   rosuvastatin   40 mg Oral Daily   sodium chloride flush  3 mL Intravenous Q12H   ticagrelor  90 mg Oral BID   Continuous Infusions:  sodium chloride     famotidine (PEPCID) IV     promethazine (PHENERGAN) injection (IM or IVPB) Stopped (12/10/22 1227)   PRN Meds: acetaminophen **OR** acetaminophen, HYDROmorphone (DILAUDID) injection, ondansetron **OR** ondansetron (ZOFRAN) IV, promethazine (PHENERGAN) injection (IM or IVPB)  Allergies:    Allergies  Allergen Reactions   Statins Other (See Comments)    Myalgias  Currently taking Crestor 10mg .    Social History:   Social History   Socioeconomic History   Marital status: Married    Spouse name: Not on file   Number of children: 2   Years of education: Not on file   Highest education level: Not on file  Occupational History   Not on file  Tobacco Use   Smoking status: Never   Smokeless tobacco: Never  Vaping Use   Vaping Use: Never used  Substance and Sexual Activity   Alcohol use: No   Drug use: No  Sexual activity: Yes  Other Topics Concern   Not on file  Social History Narrative   Not on file   Social Determinants of Health   Financial Resource Strain: Not on file  Food Insecurity: No Food Insecurity (10/20/2022)   Hunger Vital Sign    Worried About Running Out of Food in the Last Year: Never true    Ran Out of Food in the Last Year: Never true  Transportation Needs: No Transportation Needs (10/20/2022)   PRAPARE - Administrator, Civil Service (Medical): No    Lack of Transportation (Non-Medical): No  Physical Activity: Not on file  Stress: Not on file  Social Connections: Not on file  Intimate Partner Violence: Not At Risk (10/20/2022)   Humiliation, Afraid, Rape, and Kick questionnaire    Fear of Current or Ex-Partner: No    Emotionally Abused: No    Physically Abused: No    Sexually Abused: No    Family History:    Family History  Problem Relation Age of Onset   Hypertension Mother    Cancer Mother     Hypertension Father    Inflammatory bowel disease Brother    Diabetes Paternal Uncle    Diabetes Paternal Grandmother    Diabetes Paternal Grandfather    Heart disease Neg Hx    Colon cancer Neg Hx    Esophageal cancer Neg Hx    Rectal cancer Neg Hx    Stomach cancer Neg Hx      ROS:  Please see the history of present illness.   All other ROS reviewed and negative.     Physical Exam/Data:   Vitals:   12/10/22 0930 12/10/22 0935 12/10/22 1015 12/10/22 1243  BP: 136/67  (!) 179/89 (!) 187/79  Pulse:  100 91 (!) 103  Resp:  18 15 20   Temp:    98.1 F (36.7 C)  TempSrc:    Oral  SpO2: 100% 100% 100% 100%    Intake/Output Summary (Last 24 hours) at 12/10/2022 1355 Last data filed at 12/09/2022 1915 Gross per 24 hour  Intake 500 ml  Output --  Net 500 ml      12/05/2022    8:00 AM 11/14/2022    8:15 AM 11/07/2022    8:30 AM  Last 3 Weights  Weight (lbs) 201 lb 206 lb 12.7 oz 203 lb 3.2 oz  Weight (kg) 91.173 kg 93.8 kg 92.171 kg     There is no height or weight on file to calculate BMI.  General: Ill-appearing and in obvious dicomfort HEENT: normal Neck: no JVD Vascular: No carotid bruits; Distal pulses 2+ bilaterally Cardiac:  normal S1, S2; RRR; no murmur  Lungs:  clear to auscultation bilaterally, no wheezing, rhonchi or rales  Abd: soft, nontender, no hepatomegaly  Ext: no edema Musculoskeletal:  No deformities, BUE and BLE strength normal and equal Skin: warm and dry  Neuro:  CNs 2-12 intact, no focal abnormalities noted Psych:  Normal affect   EKG:  The EKG was personally reviewed and demonstrates:  Normal sinus rhythm, heart rate 93.  No ST-T wave changes. Telemetry:  Telemetry was personally reviewed and demonstrates: Sinus tachycardia with heart rates fluctuating between 90-100s  Relevant CV Studies: Echocardiogram 10/21/2022 1. Left ventricular ejection fraction, by estimation, is 70 to 75%. The  left ventricle has hyperdynamic function. The left  ventricle has no  regional wall motion abnormalities. There is mild left ventricular  hypertrophy. Indeterminate diastolic filling  due to E-A  fusion.   2. Right ventricular systolic function is normal. The right ventricular  size is normal.   3. The mitral valve is normal in structure. Trivial mitral valve  regurgitation.   4. The aortic valve is tricuspid. Aortic valve regurgitation is not  visualized. Aortic valve sclerosis/calcification is present, without any  evidence of aortic stenosis.   5. Aortic dilatation noted. There is moderate dilatation of the ascending  aorta, measuring 43 mm.   6. The inferior vena cava is dilated in size with <50% respiratory  variability, suggesting right atrial pressure of 15 mmHg.   Left heart catheterization 10/20/2022 ULPRIT Prox-Mid Cx lesion is 99% stenosed-> 1st Mrg lesion is 100% stenosed, TIMI 0 flow. -(1 lesion in segment)   Post intervention, there is a 30% residual stenosis throughout the entire segment restoring TIMI-3 flow.->   --------------------------------------------------------------------   Ost LAD to Prox LAD lesion is 85% stenosed.   A drug-eluting stent was successfully placed using a Synergy XD 3.0 x 16-deployed to 3.3 mm. Post intervention, there is a 0% residual stenosis.   Mid LAD to Dist LAD lesion is 60% stenosed. Dist LAD-1 lesion is 60% stenosed. Dist LAD-2 lesion is 30% stenosed.   ---------------------------------------------------------------------   Prox RCA lesion is 25% stenosed.  Dist RCA lesion is 20% stenosed.  2nd PL from rPDA is 60% stenosed.   ---------------------------------------------------------------------   The left ventricular systolic function is normal.  The left ventricular ejection fraction is 55-65% by visual estimate.  LV end diastolic pressure is normal.   There is no aortic valve stenosis.     ================================================ POST-CATH DIAGNOSES Three-Vessel Disease: Culprit  Lesion: 99% proximal LCx into 100% OM1, TIMI 0 flow->  Successful Balloon PTCA restoring TIMI-3 flow reducing to 30% stenosis. Focal ostial-proximal LAD 85% stenosis (TIMI-3 flow) followed by sequential 60% lesions in the mid and still vessel as well as 30% a goal with diffuse disease, 1 diminutive D1 1 followed by 2 small caliber D2 and D3 branches mild disease. ->  Successful DES PCI of ostial to proximal LAD with Synergy DES 3.0 mm x 16 mm deployed to 3.3 mm -> reduced to 0% (TIMI 3 flow pre & post) Large dominant RCA with proximal 25% and distal 20% lesions. There is a small posterolateral branch in the anterior descending artery actually bifurcates into get again another posterolateral branch that cover the large portion of the circumflex territory. This vessel has still percent ostial disease.   Relatively normal LVEF with no obvious wall motion abnormality. Normal EDP.   Laboratory Data:  High Sensitivity Troponin:   Recent Labs  Lab 12/09/22 1730 12/09/22 1858  TROPONINIHS 9 8     Chemistry Recent Labs  Lab 12/09/22 1730 12/10/22 0131  NA 135 139  K 3.9 4.2  CL 99 106  CO2 22 19*  GLUCOSE 183* 88  BUN 19 17  CREATININE 1.17 1.03  CALCIUM 9.3 8.8*  MG  --  2.0  GFRNONAA >60 >60  ANIONGAP 14 14    Recent Labs  Lab 12/09/22 1858 12/10/22 0131  PROT 7.7 7.5  ALBUMIN 3.4* 3.3*  AST 30 21  ALT 23 20  ALKPHOS 48 47  BILITOT 1.4* 1.4*   Lipids No results for input(s): "CHOL", "TRIG", "HDL", "LABVLDL", "LDLCALC", "CHOLHDL" in the last 168 hours.  Hematology Recent Labs  Lab 12/09/22 1730 12/10/22 0131  WBC 5.2 5.7  RBC 4.13* 3.86*  HGB 13.4 12.7*  HCT 39.9 37.9*  MCV 96.6 98.2  MCH  32.4 32.9  MCHC 33.6 33.5  RDW 14.0 14.3  PLT 172 154   Thyroid No results for input(s): "TSH", "FREET4" in the last 168 hours.  BNPNo results for input(s): "BNP", "PROBNP" in the last 168 hours.  DDimer No results for input(s): "DDIMER" in the last 168  hours.   Radiology/Studies:  CT ABDOMEN PELVIS W CONTRAST  Result Date: 12/09/2022 CLINICAL DATA:  Crohn's exacerbation. EXAM: CT ABDOMEN AND PELVIS WITH CONTRAST TECHNIQUE: Multidetector CT imaging of the abdomen and pelvis was performed using the standard protocol following bolus administration of intravenous contrast. RADIATION DOSE REDUCTION: This exam was performed according to the departmental dose-optimization program which includes automated exposure control, adjustment of the mA and/or kV according to patient size and/or use of iterative reconstruction technique. CONTRAST:  75mL OMNIPAQUE IOHEXOL 350 MG/ML SOLN COMPARISON:  MRI pelvis 12/31/2021; MR enterography 07/14/2022; CT pelvis 04/03/2021 FINDINGS: Lower chest: No acute abnormality. Hepatobiliary: Cholelithiasis without evidence of cholecystitis. Unremarkable liver. No biliary dilation. Pancreas: Unremarkable. Spleen: Unremarkable. Adrenals/Urinary Tract: Normal adrenal glands. No urinary calculi or hydronephrosis. Unremarkable bladder. 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 Small stool ball in the ileoanal pouch with mild wall thickening an adjacent stranding. No evidence of obstruction. No additional bowel wall thickening. Stomach is within normal limits. Vascular/Lymphatic: Subcentimeter perirectal lymph nodes are likely reactive. No acute vascular abnormality. Reproductive: Enlarged prostate. Other: No free intraperitoneal air. Partially visualized setons within the left perianal region where there is soft tissue thickening in the region of the previously seen abscess in perianal fistula. Musculoskeletal: No acute osseous abnormality. IMPRESSION: 1. 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 2. Partially visualized perianal fistula.  No visible abscess. 3. Cholelithiasis without evidence of cholecystitis. Electronically Signed   By: Minerva Fester M.D.   On: 12/09/2022 22:55   DG Chest 2 View  Result Date: 12/09/2022 CLINICAL DATA:  Chest pain.  Three EXAM: CHEST - 2 VIEW COMPARISON:  3024 FINDINGS: The heart size and mediastinal contours are within normal limits. Both lungs are clear. The visualized skeletal structures are unremarkable. IMPRESSION: No active cardiopulmonary disease. Electronically Signed   By: Norva Pavlov M.D.   On: 12/09/2022 18:20     Assessment and Plan:   Chest discomfort with intractable nausea and vomiting Since receiving patient's Remicade infusion last Thursday patient has had intermittent vomiting that has been associated with some chest discomfort.  EKG showing no ST-T wave changes, negative troponins.  Given compliancy with DAPT therapy and lack of symptoms outside of these episodes of vomiting it is unlikely that this is ischemic in nature. He could certainly be having some demand from physiologic stressors in setting of known residual disease but doubt ACS. No clinical signs of stent thrombosis. CT also showing pouchitis. Suspect that this may be attributed to Crohn's flareup, recent Remicade infusion or some other GI related issue.  Consider GI consult. If tachycardia does not improve with resolution of vomiting, could consider PE r/o though suspicion for this is much lower, no hypoxia.  CAD DES-pLAD, PTCA-pLCx Reports compliancy with DAPT therapy. Continue aspirin 81 mg daily and Brilinta 90 mg twice daily.  Tolerating medications well with no evidence of bleeding complications. Continue rosuvastatin 40 mg daily, carvedilol 6.25mg  QAM, 12.5 mg QPM. Can probably increase carvedilol back to 12.5mg  BID since hypotension resolved. Despite vomiting patient has not seen any nondigestible pills.  Hypotension (on several days prior to admission) then hypertension Patient remains mildly tachycardic and  now has had elevated blood pressures reaching the 190s.  In the setting of persistent vomiting this is  expected. May be dehydrated despite normal lab findings.  Will increase carvedilol back to 12.5mg  BID If renal function stable in AM return irbesartan to 150mg  daily   Crohn's disease Type 2 diabetes dependent on insulin  Risk Assessment/Risk Scores:   For questions or updates, please contact [facility] Please consult [email or web address] for contact info under    Signed, Abagail Kitchens, PA-C  12/10/2022 1:55 PM   Agree with note by Yvonna Alanis PA-C  We are asked to see Mr. Olshefski because of atypical chest pain.  He had LAD stenting and circumflex angioplasty in March of this year with normal LV function.  He has been having nausea and vomiting with associated chest pain that does not sound cardiac.  He is currently pain-free.  His EKG shows no acute changes.  His troponins are low and flat.  He has been taking his aspirin and Brilinta religiously.  His exam is benign.  I do not think his pain is ischemically mediated.  Suggest GI consult.  No further evaluation of his chest pain is necessary at this time.  Will see back on an as-needed basis.   Runell Gess, M.D., FACP, [facility], [facility] [address] [place], [place]  [address]  [phone] 12/10/2022 3:07 PM

## 2022-12-10 NOTE — Progress Notes (Signed)
PROGRESS NOTE    Sean Schaefer  VWU:[id number] DOB: 1965/08/12 DOA: 12/09/2022 PCP: Renford Dills, MD    Brief Narrative:  Patient is 57 year old gentleman with recent history of non-STEMI status postcardiac cath and stenting, Crohn's disease with fistula, insulin-dependent type 2 diabetes, hypertension who presents to the emergency room with about 1 week of ongoing nausea, vomiting and chest discomfort.  He had episode of dizziness at cardiac rehab.  Chest pain started even before vomiting. In the emergency room hemodynamically stable.  Troponins negative.  CT scan abdomen pelvis notable for postoperative changes of colectomy and ileoanal pouch inflammation otherwise normal.  Admitted due to significant symptoms.   Assessment & Plan:   Intractable nausea vomiting: Patient did not tolerate advancing to regular diet today.  Scale back to clears liquid diet.  Adequate IV fluid.  Adequate nausea medications.  Will add Pepcid. Abdominal exam is benign. Patient with normal bowel function. CT scan abdomen pelvis without any significant findings.  Unlikely infection.  Will discontinue antibiotics.  Monitor for tolerance of diet.  Chest pain in a patient with coronary artery disease and recent LAD stent: Symptoms exacerbated after working at cardiac rehab. Troponins have been negative.  EKG nonischemic. Patient remains on aspirin and Brilinta, statin and beta-blocker. Due to ongoing chest discomfort, I asked for cardiology consultation.  Crohn's disease with fistula, pouchitis: This is likely chronic findings and related to inflammation.  Not infection.  Will discontinue antibiotics.  Essential hypertension with fluctuating blood pressures: Resume Coreg and irbesartan.  Monitor.  Type 2 diabetes: Recent A1c of 12.1.  No reliable oral intake.  Avoid hypoglycemia.  On lower dose of long-acting insulin and sliding scale insulin.  DVT prophylaxis: enoxaparin (LOVENOX) injection 40 mg Start:  12/10/22 1000   Code Status: Full code Family Communication: None at the bedside Disposition Plan: Status is: Observation The patient will require care spanning > 2 midnights and should be moved to inpatient because: Persistent nausea vomiting, did not tolerate oral challenge, IV fluids     Consultants:  Cardiology  Procedures:  None  Antimicrobials:  Rocephin Flagyl 5/20-5/21   Subjective: Patient seen and examined in the emergency room.  On my exam he had just finished his clear liquid diet and was feeling well.  Gave him regular diet, patient started having nausea, midsternal chest pain.  Denies any shortness of breath.  Objective: Vitals:   12/10/22 0930 12/10/22 0935 12/10/22 1015 12/10/22 1243  BP: 136/67  (!) 179/89 (!) 187/79  Pulse:  100 91 (!) 103  Resp:  18 15 20   Temp:    98.1 F (36.7 C)  TempSrc:    Oral  SpO2: 100% 100% 100% 100%    Intake/Output Summary (Last 24 hours) at 12/10/2022 1254 Last data filed at 12/09/2022 1915 Gross per 24 hour  Intake 500 ml  Output --  Net 500 ml   There were no vitals filed for this visit.  Examination:  General exam: Appears calm and comfortable at rest. Respiratory system: No added sounds. Cardiovascular system: S1 & S2 heard, RRR.  Gastrointestinal system: Soft.  Nontender.  Bowel sound present.  Surgical scars are nontender. Central nervous system: Alert and oriented. No focal neurological deficits. Extremities: Symmetric 5 x 5 power. Skin: No rashes, lesions or ulcers Psychiatry: Judgement and insight appear normal. Mood & affect appropriate.     Data Reviewed: I have personally reviewed following labs and imaging studies  CBC: Recent Labs  Lab 12/09/22 1730 12/10/22 0131  WBC  5.2 5.7  HGB 13.4 12.7*  HCT 39.9 37.9*  MCV 96.6 98.2  PLT 172 154   Basic Metabolic Panel: Recent Labs  Lab 12/09/22 1730 12/10/22 0131  NA 135 139  K 3.9 4.2  CL 99 106  CO2 22 19*  GLUCOSE 183* 88  BUN 19 17   CREATININE 1.17 1.03  CALCIUM 9.3 8.8*  MG  --  2.0   GFR: Estimated Creatinine Clearance: 93.1 mL/min (by C-G formula based on SCr of 1.03 mg/dL). Liver Function Tests: Recent Labs  Lab 12/09/22 1858 12/10/22 0131  AST 30 21  ALT 23 20  ALKPHOS 48 47  BILITOT 1.4* 1.4*  PROT 7.7 7.5  ALBUMIN 3.4* 3.3*   Recent Labs  Lab 12/09/22 1858  LIPASE 40   No results for input(s): "AMMONIA" in the last 168 hours. Coagulation Profile: No results for input(s): "INR", "PROTIME" in the last 168 hours. Cardiac Enzymes: No results for input(s): "CKTOTAL", "CKMB", "CKMBINDEX", "TROPONINI" in the last 168 hours. BNP (last 3 results) No results for input(s): "PROBNP" in the last 8760 hours. HbA1C: No results for input(s): "HGBA1C" in the last 72 hours. CBG: Recent Labs  Lab 12/09/22 2356 12/10/22 0319 12/10/22 0750 12/10/22 1200  GLUCAP 83 84 75 98   Lipid Profile: No results for input(s): "CHOL", "HDL", "LDLCALC", "TRIG", "CHOLHDL", "LDLDIRECT" in the last 72 hours. Thyroid Function Tests: No results for input(s): "TSH", "T4TOTAL", "FREET4", "T3FREE", "THYROIDAB" in the last 72 hours. Anemia Panel: No results for input(s): "VITAMINB12", "FOLATE", "FERRITIN", "TIBC", "IRON", "RETICCTPCT" in the last 72 hours. Sepsis Labs: No results for input(s): "PROCALCITON", "LATICACIDVEN" in the last 168 hours.  No results found for this or any previous visit (from the past 240 hour(s)).       Radiology Studies: CT ABDOMEN PELVIS W CONTRAST  Result Date: 12/09/2022 CLINICAL DATA:  Crohn's exacerbation. EXAM: CT ABDOMEN AND PELVIS WITH CONTRAST TECHNIQUE: Multidetector CT imaging of the abdomen and pelvis was performed using the standard protocol following bolus administration of intravenous contrast. RADIATION DOSE REDUCTION: This exam was performed according to the departmental dose-optimization program which includes automated exposure control, adjustment of the mA and/or kV according  to patient size and/or use of iterative reconstruction technique. CONTRAST:  75mL OMNIPAQUE IOHEXOL 350 MG/ML SOLN COMPARISON:  MRI pelvis 12/31/2021; MR enterography 07/14/2022; CT pelvis 04/03/2021 FINDINGS: Lower chest: No acute abnormality. Hepatobiliary: Cholelithiasis without evidence of cholecystitis. Unremarkable liver. No biliary dilation. Pancreas: Unremarkable. Spleen: Unremarkable. Adrenals/Urinary Tract: Normal adrenal glands. No urinary calculi or hydronephrosis. Unremarkable bladder. 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 Small stool ball in the ileoanal pouch with mild wall thickening an adjacent stranding. No evidence of obstruction. No additional bowel wall thickening. Stomach is within normal limits. Vascular/Lymphatic: Subcentimeter perirectal lymph nodes are likely reactive. No acute vascular abnormality. Reproductive: Enlarged prostate. Other: No free intraperitoneal air. Partially visualized setons within the left perianal region where there is soft tissue thickening in the region of the previously seen abscess in perianal fistula. Musculoskeletal: No acute osseous abnormality. IMPRESSION: 1. 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 2. Partially visualized perianal fistula.  No visible abscess. 3. Cholelithiasis without evidence of cholecystitis. Electronically Signed   By: Minerva Fester M.D.   On: 12/09/2022 22:55   DG Chest 2 View  Result Date: 12/09/2022 CLINICAL DATA:  Chest pain.  Three EXAM: CHEST - 2 VIEW COMPARISON:  3024 FINDINGS: The heart size and mediastinal contours are within normal  limits. Both lungs are clear. The visualized skeletal structures are unremarkable. IMPRESSION: No active cardiopulmonary disease. Electronically Signed   By: Norva Pavlov M.D.   On: 12/09/2022 18:20        Scheduled Meds:  aspirin EC  81 mg Oral Daily   azaTHIOprine  150 mg Oral  Daily   carvedilol  6.25 mg Oral Q breakfast   And   carvedilol  12.5 mg Oral QAC supper   enoxaparin (LOVENOX) injection  40 mg Subcutaneous Daily   insulin aspart  0-9 Units Subcutaneous Q4H   insulin glargine-yfgn  12 Units Subcutaneous QHS   Insulin Pen Needle  1 packet Subcutaneous QHS   irbesartan  75 mg Oral Daily   rosuvastatin  40 mg Oral Daily   sodium chloride flush  3 mL Intravenous Q12H   ticagrelor  90 mg Oral BID   Continuous Infusions:  sodium chloride     famotidine (PEPCID) IV     promethazine (PHENERGAN) injection (IM or IVPB) Stopped (12/10/22 1227)     LOS: 0 days    Time spent: 35 minutes    Dorcas Carrow, MD [facility] Pager [phone]

## 2022-12-10 NOTE — ED Notes (Signed)
ED TO INPATIENT HANDOFF REPORT  ED Nurse Name and Phone #:   S Name/Age/Gender Sean Schaefer 57 y.o. male Room/Bed: 044C/044C  Code Status   Code Status: Full Code  Home/SNF/Other Home Patient oriented to: self, place, time, and situation Is this baseline? Yes   Triage Complete: Triage complete  Chief Complaint Intractable nausea and vomiting [R11.2]  Triage Note Pt reports he went to cardiac rehab today; hypotensive; 90s systolic; went home, having CP, N/V; cardiac stent placement march 31st; states feeling similar to prior MI; pt took 5 nitro PTA; pain resolved at this time   Allergies Allergies  Allergen Reactions   Statins Other (See Comments)    Myalgias  Currently taking Crestor 10mg .    Level of Care/Admitting Diagnosis ED Disposition     ED Disposition  Admit   Condition  --   Comment  Hospital Area: [facility] [[id number]]  Level of Care: Telemetry Cardiac [103]  May place patient in [facility] at [facility] or [facility] if equivalent level of care is available:: Yes  Covid Evaluation: Asymptomatic - no recent exposure (last 10 days) testing not required  Diagnosis: Intractable nausea and vomiting [720114]  Admitting Physician: Briscoe Deutscher [[id number]]  Attending Physician: Briscoe Deutscher [[id number]]          B Medical/Surgery History Past Medical History:  Diagnosis Date   Crohn's disease (HCC)    Diabetes mellitus without complication (HCC)    Hypertension    Ulcerative colitis (HCC)    Past Surgical History:  Procedure Laterality Date   COLON SURGERY     COLONOSCOPY     CORONARY BALLOON ANGIOPLASTY N/A 10/20/2022   Procedure: CORONARY BALLOON ANGIOPLASTY;  Surgeon: Marykay Lex, MD;  Location: [facility] INVASIVE CV LAB;  Service: Cardiovascular;  Laterality: N/A;   CORONARY STENT INTERVENTION N/A 10/20/2022   Procedure: CORONARY STENT INTERVENTION;  Surgeon: Marykay Lex, MD;  Location: [facility] INVASIVE CV LAB;  Service:  Cardiovascular;  Laterality: N/A;   EYE SURGERY Left    FLEXIBLE SIGMOIDOSCOPY N/A 02/05/2021   Procedure: FLEXIBLE POUCHOSCOPY WITH BIOPSY;  Surgeon: Andria Meuse, MD;  Location: [facility] ORS;  Service: General;  Laterality: N/A;   INCISION AND DRAINAGE ABSCESS N/A 02/05/2021   Procedure: INCISION AND DRAINAGE OF PERIANAL ABSCESS;  Surgeon: Andria Meuse, MD;  Location: [facility] ORS;  Service: General;  Laterality: N/A;   INCISION AND DRAINAGE PERIRECTAL ABSCESS N/A 11/30/2020   Procedure: IRRIGATION AND DEBRIDEMENT PERIRECTAL ABSCESS;  Surgeon: Fritzi Mandes, MD;  Location: [facility] OR;  Service: General;  Laterality: N/A;   INCISION AND DRAINAGE PERIRECTAL ABSCESS Left 04/03/2021   Procedure: left peri-rectal/gluteal abscess;  Surgeon: Diamantina Monks, MD;  Location: [facility] OR;  Service: General;  Laterality: Left;   LEFT HEART CATH AND CORONARY ANGIOGRAPHY N/A 10/20/2022   Procedure: LEFT HEART CATH AND CORONARY ANGIOGRAPHY;  Surgeon: Marykay Lex, MD;  Location: [facility] INVASIVE CV LAB;  Service: Cardiovascular;  Laterality: N/A;   Perianal fistula repair  07/22/2010   PLACEMENT OF SETON N/A 02/05/2021   Procedure: PLACEMENT OF SETONS X2;  Surgeon: Andria Meuse, MD;  Location: [facility] ORS;  Service: General;  Laterality: N/A;   RECTAL EXAM UNDER ANESTHESIA N/A 02/05/2021   Procedure: ANORECTAL EXAM UNDER ANESTHESIA;  Surgeon: Andria Meuse, MD;  Location: [facility] ORS;  Service: General;  Laterality: N/A;   TESTICLE REMOVAL Left 2000     A IV Location/Drains/Wounds Patient Lines/Drains/Airways Status  Active Line/Drains/Airways     Name Placement date Placement time Site Days   Peripheral IV 12/09/22 20 G Right Antecubital 12/09/22  1733  Antecubital  1   Closed System Drain 1 Other (Comment) Rectal Other (Comment) 02/05/21  0928  Rectal  673   Open Drain 1 Left Perineal  11/30/20  --  Perineal  740   Open Drain 1 Left Buttock  04/04/21  0019  Buttock  615             Intake/Output Last 24 hours  Intake/Output Summary (Last 24 hours) at 12/10/2022 1245 Last data filed at 12/09/2022 1915 Gross per 24 hour  Intake 500 ml  Output --  Net 500 ml    Labs/Imaging Results for orders placed or performed during the hospital encounter of 12/09/22 (from the past 48 hour(s))  Basic metabolic panel     Status: Abnormal   Collection Time: 12/09/22  5:30 PM  Result Value Ref Range   Sodium 135 135 - 145 mmol/L   Potassium 3.9 3.5 - 5.1 mmol/L   Chloride 99 98 - 111 mmol/L   CO2 22 22 - 32 mmol/L   Glucose, Bld 183 (H) 70 - 99 mg/dL    Comment: Glucose reference range applies only to samples taken after fasting for at least 8 hours.   BUN 19 6 - 20 mg/dL   Creatinine, Ser 4.09 0.61 - 1.24 mg/dL   Calcium 9.3 8.9 - 81.1 mg/dL   GFR, Estimated >91 >47 mL/min    Comment: (NOTE) Calculated using the CKD-EPI Creatinine Equation (2021)    Anion gap 14 5 - 15    Comment: Performed at [facility] Lab, [address] [place], [place] [address]  CBC     Status: Abnormal   Collection Time: 12/09/22  5:30 PM  Result Value Ref Range   WBC 5.2 4.0 - 10.5 K/uL   RBC 4.13 (L) 4.22 - 5.81 MIL/uL   Hemoglobin 13.4 13.0 - 17.0 g/dL   HCT 21.3 08.6 - 57.8 %   MCV 96.6 80.0 - 100.0 fL   MCH 32.4 26.0 - 34.0 pg   MCHC 33.6 30.0 - 36.0 g/dL   RDW 46.9 62.9 - 52.8 %   Platelets 172 150 - 400 K/uL   nRBC 0.0 0.0 - 0.2 %    Comment: Performed at [facility], [address] [place], [place] [address]  Troponin I (High Sensitivity)     Status: None   Collection Time: 12/09/22  5:30 PM  Result Value Ref Range   Troponin I (High Sensitivity) 9 <18 ng/L    Comment: (NOTE) Elevated high sensitivity troponin I (hsTnI) values and significant  changes across serial measurements may suggest ACS but many other  chronic and acute conditions are known to elevate hsTnI results.  Refer to the "Links" section for chest pain algorithms and additional   guidance. Performed at [facility], [address] [place], [place] [address]   Troponin I (High Sensitivity)     Status: None   Collection Time: 12/09/22  6:58 PM  Result Value Ref Range   Troponin I (High Sensitivity) 8 <18 ng/L    Comment: (NOTE) Elevated high sensitivity troponin I (hsTnI) values and significant  changes across serial measurements may suggest ACS but many other  chronic and acute conditions are known to elevate hsTnI results.  Refer to the "Links" section for chest pain algorithms and additional  guidance. Performed at  [facility], [address], [place], [place] [address]   Hepatic function panel     Status: Abnormal   Collection Time: 12/09/22  6:58 PM  Result Value Ref Range   Total Protein 7.7 6.5 - 8.1 g/dL   Albumin 3.4 (L) 3.5 - 5.0 g/dL   AST 30 15 - 41 U/L   ALT 23 0 - 44 U/L   Alkaline Phosphatase 48 38 - 126 U/L   Total Bilirubin 1.4 (H) 0.3 - 1.2 mg/dL   Bilirubin, Direct 0.2 0.0 - 0.2 mg/dL   Indirect Bilirubin 1.2 (H) 0.3 - 0.9 mg/dL    Comment: Performed at [facility], [address] [place], [place] [address]  Lipase, blood     Status: None   Collection Time: 12/09/22  6:58 PM  Result Value Ref Range   Lipase 40 11 - 51 U/L    Comment: Performed at [facility], [address] [place], [place] [address]  CBG monitoring, ED     Status: None   Collection Time: 12/09/22 11:56 PM  Result Value Ref Range   Glucose-Capillary 83 70 - 99 mg/dL    Comment: Glucose reference range applies only to samples taken after fasting for at least 8 hours.  HIV Antibody (routine testing w rflx)     Status: None   Collection Time: 12/10/22  1:31 AM  Result Value Ref Range   HIV Screen 4th Generation wRfx Non Reactive Non Reactive    Comment: Performed at [facility], [address] [place], [place] [address]  Comprehensive metabolic panel     Status: Abnormal   Collection Time: 12/10/22  1:31 AM  Result Value Ref  Range   Sodium 139 135 - 145 mmol/L   Potassium 4.2 3.5 - 5.1 mmol/L   Chloride 106 98 - 111 mmol/L   CO2 19 (L) 22 - 32 mmol/L   Glucose, Bld 88 70 - 99 mg/dL    Comment: Glucose reference range applies only to samples taken after fasting for at least 8 hours.   BUN 17 6 - 20 mg/dL   Creatinine, Ser 2.95 0.61 - 1.24 mg/dL   Calcium 8.8 (L) 8.9 - 10.3 mg/dL   Total Protein 7.5 6.5 - 8.1 g/dL   Albumin 3.3 (L) 3.5 - 5.0 g/dL   AST 21 15 - 41 U/L   ALT 20 0 - 44 U/L   Alkaline Phosphatase 47 38 - 126 U/L   Total Bilirubin 1.4 (H) 0.3 - 1.2 mg/dL   GFR, Estimated >62 >13 mL/min    Comment: (NOTE) Calculated using the CKD-EPI Creatinine Equation (2021)    Anion gap 14 5 - 15    Comment: Performed at [facility], [address] [place], [place] [address]  Magnesium     Status: None   Collection Time: 12/10/22  1:31 AM  Result Value Ref Range   Magnesium 2.0 1.7 - 2.4 mg/dL    Comment: Performed at [facility], [address] [place], [place] [address]  CBC     Status: Abnormal   Collection Time: 12/10/22  1:31 AM  Result Value Ref Range   WBC 5.7 4.0 - 10.5 K/uL   RBC 3.86 (L) 4.22 - 5.81 MIL/uL   Hemoglobin 12.7 (L) 13.0 - 17.0 g/dL   HCT 29.5 (L) 28.4 - 13.2 %   MCV 98.2 80.0 - 100.0 fL   MCH 32.9 26.0 - 34.0  pg   MCHC 33.5 30.0 - 36.0 g/dL   RDW 16.1 09.6 - 04.5 %   Platelets 154 150 - 400 K/uL   nRBC 0.0 0.0 - 0.2 %    Comment: Performed at [facility], [address] [place], [place] [address]  CBG monitoring, ED     Status: None   Collection Time: 12/10/22  3:19 AM  Result Value Ref Range   Glucose-Capillary 84 70 - 99 mg/dL    Comment: Glucose reference range applies only to samples taken after fasting for at least 8 hours.  CBG monitoring, ED     Status: None   Collection Time: 12/10/22  7:50 AM  Result Value Ref Range   Glucose-Capillary 75 70 - 99 mg/dL    Comment: Glucose reference range applies only to samples taken after fasting for at  least 8 hours.  CBG monitoring, ED     Status: None   Collection Time: 12/10/22 12:00 PM  Result Value Ref Range   Glucose-Capillary 98 70 - 99 mg/dL    Comment: Glucose reference range applies only to samples taken after fasting for at least 8 hours.   CT ABDOMEN PELVIS W CONTRAST  Result Date: 12/09/2022 CLINICAL DATA:  Crohn's exacerbation. EXAM: CT ABDOMEN AND PELVIS WITH CONTRAST TECHNIQUE: Multidetector CT imaging of the abdomen and pelvis was performed using the standard protocol following bolus administration of intravenous contrast. RADIATION DOSE REDUCTION: This exam was performed according to the departmental dose-optimization program which includes automated exposure control, adjustment of the mA and/or kV according to patient size and/or use of iterative reconstruction technique. CONTRAST:  75mL OMNIPAQUE IOHEXOL 350 MG/ML SOLN COMPARISON:  MRI pelvis 12/31/2021; MR enterography 07/14/2022; CT pelvis 04/03/2021 FINDINGS: Lower chest: No acute abnormality. Hepatobiliary: Cholelithiasis without evidence of cholecystitis. Unremarkable liver. No biliary dilation. Pancreas: Unremarkable. Spleen: Unremarkable. Adrenals/Urinary Tract: Normal adrenal glands. No urinary calculi or hydronephrosis. Unremarkable bladder. 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 Small stool ball in the ileoanal pouch with mild wall thickening an adjacent stranding. No evidence of obstruction. No additional bowel wall thickening. Stomach is within normal limits. Vascular/Lymphatic: Subcentimeter perirectal lymph nodes are likely reactive. No acute vascular abnormality. Reproductive: Enlarged prostate. Other: No free intraperitoneal air. Partially visualized setons within the left perianal region where there is soft tissue thickening in the region of the previously seen abscess in perianal fistula. Musculoskeletal: No acute osseous abnormality. IMPRESSION: 1. Postoperative change of  colectomy with ileoanal pouch anastomosis with findings suggestive pouchitis. 2. Partially visualized perianal fistula.  No visible abscess. 3. Cholelithiasis without evidence of cholecystitis. Electronically Signed   By: Minerva Fester M.D.   On: 12/09/2022 22:55   DG Chest 2 View  Result Date: 12/09/2022 CLINICAL DATA:  Chest pain.  Three EXAM: CHEST - 2 VIEW COMPARISON:  3024 FINDINGS: The heart size and mediastinal contours are within normal limits. Both lungs are clear. The visualized skeletal structures are unremarkable. IMPRESSION: No active cardiopulmonary disease. Electronically Signed   By: Norva Pavlov M.D.   On: 12/09/2022 18:20    Pending Labs Unresulted Labs (From admission, onward)     Start     Ordered   12/16/22 0500  Creatinine, serum  (enoxaparin (LOVENOX)    CrCl >/= 30 ml/min)  Weekly,   R     Comments: while on enoxaparin therapy    12/09/22 2331   12/10/22 0500  CBC  Daily,   R      12/09/22 2331  Vitals/Pain Today's Vitals   12/10/22 0930 12/10/22 0935 12/10/22 1015 12/10/22 1243  BP: 136/67  (!) 179/89 (!) 187/79  Pulse:  100 91 (!) 103  Resp:  18 15 20   Temp:    98.1 F (36.7 C)  TempSrc:    Oral  SpO2: 100% 100% 100% 100%  PainSc:        Isolation Precautions No active isolations  Medications Medications  aspirin EC tablet 81 mg (81 mg Oral Given 12/10/22 1000)  irbesartan (AVAPRO) tablet 75 mg (75 mg Oral Given 12/10/22 1000)  rosuvastatin (CRESTOR) tablet 40 mg (40 mg Oral Given 12/10/22 1000)  ticagrelor (BRILINTA) tablet 90 mg (90 mg Oral Given 12/10/22 1000)  insulin glargine-yfgn (SEMGLEE) injection 12 Units (12 Units Subcutaneous Not Given 12/10/22 0100)  insulin aspart (novoLOG) injection 0-9 Units ( Subcutaneous Not Given 12/10/22 1207)  enoxaparin (LOVENOX) injection 40 mg (0 mg Subcutaneous Hold 12/10/22 1001)  sodium chloride flush (NS) 0.9 % injection 3 mL (3 mLs Intravenous Not Given 12/10/22 1001)  acetaminophen  (TYLENOL) tablet 650 mg (has no administration in time range)    Or  acetaminophen (TYLENOL) suppository 650 mg (has no administration in time range)  0.9 %  sodium chloride infusion (0 mLs Intravenous Stopped 12/10/22 1121)  HYDROmorphone (DILAUDID) injection 0.5 mg (has no administration in time range)  ondansetron (ZOFRAN) tablet 4 mg (has no administration in time range)    Or  ondansetron (ZOFRAN) injection 4 mg (has no administration in time range)  promethazine (PHENERGAN) 12.5 mg in sodium chloride 0.9 % 50 mL IVPB (0 mg Intravenous Stopped 12/10/22 1227)  carvedilol (COREG) tablet 6.25 mg (6.25 mg Oral Given 12/10/22 0847)    And  carvedilol (COREG) tablet 12.5 mg (has no administration in time range)  azaTHIOprine (IMURAN) tablet 150 mg (150 mg Oral Given 12/10/22 1001)  sodium chloride 0.9 % bolus 500 mL (0 mLs Intravenous Stopped 12/09/22 1915)  ondansetron (ZOFRAN) injection 4 mg (4 mg Intravenous Given 12/09/22 1851)  morphine (PF) 4 MG/ML injection 4 mg (4 mg Intravenous Given 12/09/22 1946)  ondansetron (ZOFRAN) injection 4 mg (4 mg Intravenous Given 12/09/22 2029)  0.9 %  sodium chloride infusion (0 mLs Intravenous Stopped 12/10/22 0000)  iohexol (OMNIPAQUE) 350 MG/ML injection 75 mL (75 mLs Intravenous Contrast Given 12/09/22 2234)    Mobility walks     Focused Assessments    R Recommendations: See Admitting Provider Note  Report given to:   Additional Notes:

## 2022-12-10 NOTE — Telephone Encounter (Signed)
Noted.  I see that he was seen in the ER.  Troponins were negative.  I think he is be admitted to medicine.  Bryan Lemma, MD

## 2022-12-10 NOTE — ED Notes (Signed)
Patient complained of chest pain and also had to use the bathroom. Performed an EKG and informed Dr. Antionette Char. EKG looks good per MD and it is believed this is GI related. Patient says he feels better after using the bathroom.

## 2022-12-11 ENCOUNTER — Ambulatory Visit: Payer: 59 | Admitting: Physician Assistant

## 2022-12-11 ENCOUNTER — Encounter (HOSPITAL_COMMUNITY): Payer: 59

## 2022-12-11 DIAGNOSIS — I2510 Atherosclerotic heart disease of native coronary artery without angina pectoris: Secondary | ICD-10-CM | POA: Diagnosis not present

## 2022-12-11 DIAGNOSIS — K50813 Crohn's disease of both small and large intestine with fistula: Secondary | ICD-10-CM

## 2022-12-11 DIAGNOSIS — R112 Nausea with vomiting, unspecified: Secondary | ICD-10-CM | POA: Diagnosis not present

## 2022-12-11 DIAGNOSIS — Z794 Long term (current) use of insulin: Secondary | ICD-10-CM | POA: Diagnosis not present

## 2022-12-11 DIAGNOSIS — E11649 Type 2 diabetes mellitus with hypoglycemia without coma: Secondary | ICD-10-CM

## 2022-12-11 LAB — BASIC METABOLIC PANEL
Anion gap: 14 (ref 5–15)
BUN: 21 mg/dL — ABNORMAL HIGH (ref 6–20)
CO2: 20 mmol/L — ABNORMAL LOW (ref 22–32)
Calcium: 8.3 mg/dL — ABNORMAL LOW (ref 8.9–10.3)
Chloride: 101 mmol/L (ref 98–111)
Creatinine, Ser: 1.33 mg/dL — ABNORMAL HIGH (ref 0.61–1.24)
GFR, Estimated: >60 mL/min (ref >60)
Glucose, Bld: 143 mg/dL — ABNORMAL HIGH (ref 70–99)
Potassium: 4.4 mmol/L (ref 3.5–5.1)
Sodium: 135 mmol/L (ref 135–145)

## 2022-12-11 LAB — CBC
HCT: 36.3 % — ABNORMAL LOW (ref 39.0–52.0)
Hemoglobin: 12.2 g/dL — ABNORMAL LOW (ref 13.0–17.0)
MCH: 32.6 pg (ref 26.0–34.0)
MCHC: 33.6 g/dL (ref 30.0–36.0)
MCV: 97.1 fL (ref 80.0–100.0)
Platelets: 149 10*3/uL — ABNORMAL LOW (ref 150–400)
RBC: 3.74 MIL/uL — ABNORMAL LOW (ref 4.22–5.81)
RDW: 14.3 % (ref 11.5–15.5)
WBC: 7.1 10*3/uL (ref 4.0–10.5)
nRBC: 0 % (ref 0.0–0.2)

## 2022-12-11 LAB — GLUCOSE, CAPILLARY
Glucose-Capillary: 101 mg/dL — ABNORMAL HIGH (ref 70–99)
Glucose-Capillary: 94 mg/dL (ref 70–99)
Glucose-Capillary: 122 mg/dL — ABNORMAL HIGH (ref 70–99)
Glucose-Capillary: 120 mg/dL — ABNORMAL HIGH (ref 70–99)
Glucose-Capillary: 112 mg/dL — ABNORMAL HIGH (ref 70–99)
Glucose-Capillary: 97 mg/dL (ref 70–99)

## 2022-12-11 LAB — SEDIMENTATION RATE: Sed Rate: 63 mm/hr — ABNORMAL HIGH (ref 0–16)

## 2022-12-11 LAB — C-REACTIVE PROTEIN: CRP: <0.5 mg/dL (ref <1.0)

## 2022-12-11 MED ORDER — SODIUM CHLORIDE 0.9 % IV SOLN
25.0000 mg | Freq: Four times a day (QID) | INTRAVENOUS | Status: DC | PRN
  Administered 2022-12-12: 25 mg via INTRAVENOUS
  Filled 2022-12-11: qty 1

## 2022-12-11 MED ORDER — LABETALOL HCL 5 MG/ML IV SOLN
10.0000 mg | INTRAVENOUS | Status: DC | PRN
  Administered 2022-12-11 – 2022-12-12 (×4): 10 mg via INTRAVENOUS
  Filled 2022-12-11 (×4): qty 4

## 2022-12-11 NOTE — Progress Notes (Signed)
PROGRESS NOTE    Sean Schaefer  ZOX:[id number] DOB: Jan 10, 1966 DOA: 12/09/2022 PCP: Renford Dills, MD    Brief Narrative:  Patient is 57 year old gentleman with recent history of non-STEMI status postcardiac cath and stenting, Crohn's disease with fistula, insulin-dependent type 2 diabetes, hypertension who presents to the emergency room with about 1 week of ongoing nausea, vomiting and chest discomfort.  He had episode of dizziness at cardiac rehab.  Chest pain started even before vomiting. In the emergency room hemodynamically stable.  Troponins negative.  CT scan abdomen pelvis notable for postoperative changes of colectomy and ileoanal pouch inflammation otherwise normal.  Admitted due to significant symptoms.   Assessment & Plan:   Intractable nausea vomiting: Persistent symptoms despite conservative management, around-the-clock Phenergan and Zofran.  IV fluids.  Not tolerating advancing diet. Pepcid added. Abdominal exam is benign. Patient with normal bowel function. CT scan abdomen pelvis without any significant findings.  Unlikely infection.  Will discontinue antibiotics.   Due to persistent symptoms, will consult gastroenterology.  Chest pain in a patient with coronary artery disease and recent LAD stent: Symptoms exacerbated after working at cardiac rehab. Troponins have been negative.  EKG nonischemic. Patient remains on aspirin and Brilinta, statin and beta-blocker. Due to ongoing chest discomfort, seen by cardiology.  Unlikely cardiac pain.  Ruled out.  Crohn's disease with fistula, pouchitis: This is likely chronic findings and related to inflammation.  Not infection.  Antibiotics discontinued.  Essential hypertension with fluctuating blood pressures: Resume Coreg and irbesartan.  Monitor.  As needed labetalol.  Exacerbated by nausea.  Type 2 diabetes: Recent A1c of 12.1.  No reliable oral intake.  Avoid hypoglycemia.  On lower dose of long-acting insulin and sliding  scale insulin.  DVT prophylaxis: enoxaparin (LOVENOX) injection 40 mg Start: 12/10/22 1000   Code Status: Full code Family Communication: None at the bedside Disposition Plan: Status is: Inpatient.  Remains inpatient due to persistent nausea vomiting.   Consultants:  Cardiology Gastroenterology  Procedures:  None  Antimicrobials:  Rocephin Flagyl 5/20-5/21   Subjective:  Patient seen and examined.  Continues to have nausea.  He was trying some sips of water however gets nauseated.  Occasional midsternal discomfort after nausea but denies any precordial chest pain or shortness of breath.  Blood pressure is elevated.  Phenergan is sometimes useful to help with nausea.  Objective: Vitals:   12/10/22 2100 12/11/22 0424 12/11/22 0742 12/11/22 0944  BP:  115/80 (!) 198/103 (!) 197/106  Pulse: (!) 103 90 (!) 101 98  Resp:  18 18   Temp:  98.1 F (36.7 C) 98.4 F (36.9 C)   TempSrc:  Oral Oral   SpO2:  100% 100% 100%  Weight:  90.6 kg     No intake or output data in the 24 hours ending 12/11/22 1015  Filed Weights   12/11/22 0424  Weight: 90.6 kg    Examination:  General exam: Appears calm and comfortable at rest.  Slightly anxious. Respiratory system: No added sounds. Cardiovascular system: S1 & S2 heard, RRR.  Gastrointestinal system: Soft.  Nontender.  Bowel sound present.  Surgical scars are nontender. Central nervous system: Alert and oriented. No focal neurological deficits. Extremities: Symmetric 5 x 5 power. Skin: No rashes, lesions or ulcers Psychiatry: Judgement and insight appear normal.  Anxious.    Data Reviewed: I have personally reviewed following labs and imaging studies  CBC: Recent Labs  Lab 12/09/22 1730 12/10/22 0131 12/11/22 0053  WBC 5.2 5.7 7.1  HGB 13.4  12.7* 12.2*  HCT 39.9 37.9* 36.3*  MCV 96.6 98.2 97.1  PLT 172 154 149*    Basic Metabolic Panel: Recent Labs  Lab 12/09/22 1730 12/10/22 0131 12/11/22 0053  NA 135 139 135   K 3.9 4.2 4.4  CL 99 106 101  CO2 22 19* 20*  GLUCOSE 183* 88 143*  BUN 19 17 21*  CREATININE 1.17 1.03 1.33*  CALCIUM 9.3 8.8* 8.3*  MG  --  2.0  --     GFR: Estimated Creatinine Clearance: 71.8 mL/min (A) (by C-G formula based on SCr of 1.33 mg/dL (H)). Liver Function Tests: Recent Labs  Lab 12/09/22 1858 12/10/22 0131  AST 30 21  ALT 23 20  ALKPHOS 48 47  BILITOT 1.4* 1.4*  PROT 7.7 7.5  ALBUMIN 3.4* 3.3*    Recent Labs  Lab 12/09/22 1858  LIPASE 40    No results for input(s): "AMMONIA" in the last 168 hours. Coagulation Profile: No results for input(s): "INR", "PROTIME" in the last 168 hours. Cardiac Enzymes: No results for input(s): "CKTOTAL", "CKMB", "CKMBINDEX", "TROPONINI" in the last 168 hours. BNP (last 3 results) No results for input(s): "PROBNP" in the last 8760 hours. HbA1C: No results for input(s): "HGBA1C" in the last 72 hours. CBG: Recent Labs  Lab 12/10/22 1200 12/10/22 1552 12/10/22 2107 12/11/22 0431 12/11/22 0633  GLUCAP 98 103* 139* 101* 94    Lipid Profile: No results for input(s): "CHOL", "HDL", "LDLCALC", "TRIG", "CHOLHDL", "LDLDIRECT" in the last 72 hours. Thyroid Function Tests: No results for input(s): "TSH", "T4TOTAL", "FREET4", "T3FREE", "THYROIDAB" in the last 72 hours. Anemia Panel: No results for input(s): "VITAMINB12", "FOLATE", "FERRITIN", "TIBC", "IRON", "RETICCTPCT" in the last 72 hours. Sepsis Labs: No results for input(s): "PROCALCITON", "LATICACIDVEN" in the last 168 hours.  No results found for this or any previous visit (from the past 240 hour(s)).       Radiology Studies: CT ABDOMEN PELVIS W CONTRAST  Result Date: 12/09/2022 CLINICAL DATA:  Crohn's exacerbation. EXAM: CT ABDOMEN AND PELVIS WITH CONTRAST TECHNIQUE: Multidetector CT imaging of the abdomen and pelvis was performed using the standard protocol following bolus administration of intravenous contrast. RADIATION DOSE REDUCTION: This exam was  performed according to the departmental dose-optimization program which includes automated exposure control, adjustment of the mA and/or kV according to patient size and/or use of iterative reconstruction technique. CONTRAST:  75mL OMNIPAQUE IOHEXOL 350 MG/ML SOLN COMPARISON:  MRI pelvis 12/31/2021; MR enterography 07/14/2022; CT pelvis 04/03/2021 FINDINGS: Lower chest: No acute abnormality. Hepatobiliary: Cholelithiasis without evidence of cholecystitis. Unremarkable liver. No biliary dilation. Pancreas: Unremarkable. Spleen: Unremarkable. Adrenals/Urinary Tract: Normal adrenal glands. No urinary calculi or hydronephrosis. Unremarkable bladder. 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 Small stool ball in the ileoanal pouch with mild wall thickening an adjacent stranding. No evidence of obstruction. No additional bowel wall thickening. Stomach is within normal limits. Vascular/Lymphatic: Subcentimeter perirectal lymph nodes are likely reactive. No acute vascular abnormality. Reproductive: Enlarged prostate. Other: No free intraperitoneal air. Partially visualized setons within the left perianal region where there is soft tissue thickening in the region of the previously seen abscess in perianal fistula. Musculoskeletal: No acute osseous abnormality. IMPRESSION: 1. 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 2. Partially visualized perianal fistula.  No visible abscess. 3. Cholelithiasis without evidence of cholecystitis. Electronically Signed   By: Minerva Fester M.D.   On: 12/09/2022 22:55   DG Chest 2 View  Result Date: 12/09/2022 CLINICAL DATA:  Chest pain.  Three EXAM: CHEST - 2 VIEW COMPARISON:  3024 FINDINGS: The heart size and mediastinal contours are within normal limits. Both lungs are clear. The visualized skeletal structures are unremarkable. IMPRESSION: No active cardiopulmonary disease. Electronically  Signed   By: Norva Pavlov M.D.   On: 12/09/2022 18:20        Scheduled Meds:  aspirin EC  81 mg Oral Daily   azaTHIOprine  150 mg Oral Daily   carvedilol  12.5 mg Oral BID WC   enoxaparin (LOVENOX) injection  40 mg Subcutaneous Daily   insulin aspart  0-9 Units Subcutaneous Q4H   insulin glargine-yfgn  12 Units Subcutaneous QHS   irbesartan  75 mg Oral Daily   rosuvastatin  40 mg Oral Daily   sodium chloride flush  3 mL Intravenous Q12H   ticagrelor  90 mg Oral BID   Continuous Infusions:  sodium chloride 125 mL/hr at 12/10/22 1518   famotidine (PEPCID) IV 20 mg (12/11/22 0942)   promethazine (PHENERGAN) injection (IM or IVPB)       LOS: 1 day    Time spent: 35 minutes    Dorcas Carrow, MD [facility] Pager [phone]

## 2022-12-11 NOTE — H&P (View-Only) (Signed)
                                             Consultation Note   Referring Provider:  [facility] PCP: Polite, Ronald, MD Primary Gastroenterologist: Henry Danis, MD       Reason for consultation: nausea and vomiting  DOA: 12/09/2022         Hospital Day: 3   Attending physician's note  I have taken a history, reviewed the chart and examined the patient. I performed a substantive portion of this encounter, including complete performance of at least one of the key components, in conjunction with the APP. I agree with the APP's note, impression and recommendations.    56-year-old male with history of fistulizing Crohn's disease currently on maintenance dose of azathioprine and infliximab admitted with chest pain, NSTEMI with nausea and vomiting. He was hypotensive prior to admission and is currently hypertensive with systolic blood pressure in the 200's  Will plan to proceed with EGD for further evaluation of atypical chest pain, nausea and vomiting.  Exclude severe erosive esophagitis or peptic ulcer The risks and benefits as well as alternatives of endoscopic procedure(s) have been discussed and reviewed. All questions answered. The patient agrees to proceed. Clear liquids as tolerated N.p.o. after midnight  Further recommendations based on EGD findings   The patient was provided an opportunity to ask questions and all were answered. The patient agreed with the plan and demonstrated an understanding of the instructions.  K. Veena Carrigan Delafuente , MD [phone]     Assessment and Plan   56 yo male with perianal fistulizing Crohn's disease maintained on Remicade and Azathioprine. 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 Maintained on Azathioprine and Remicade and seemingly in clinical remission. No active disease on pouchoscopy a year ago though CT scan suggesting pouchitis. Currently admitted  with N/V and chest pain. -Continue Azathioprine -ESR, CRP  Nausea / vomiting and non-exertional chest pain x 1 week. Etiology unclear.  Had recent NSTEMI but Cardiology has evaluated and doesn't feel symptoms are cardiac related. He did recently start a daily baby asa so PUD is possible. Has cholelithiasis but no evidence for acute cholecystitis and not having abdominal pain.  Plan:  -Continue BID IV famotidine.  -Continue anti-emetics -May need EGD ( on Brillinta) when BP improves.   CAD / NSTEMI s/p PTCA and stenting 10/20/22. On Brillinta + ASA   History of Present Illness Patient is a 56 y.o. 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 At some point he had recurrent problems with a perianal abscess requiring surgery and seton placement. In 2015 he saw Dr. Mann, ? Put on Humira for Crohn's disease. He felt bad on treatment, stopped the medication and remained untreated for years. In 2022 he underwent a perianal abscess drainage by Dr. White with [facility] who then referred him to us for Crohn's management. He established care with Dr. Danis 04/03/21. Please refer to that note for further details. Following that visit he required hospitalization for treatment of   another perirectal / buttock abscess.   In Nov 2022 he was started on Remicade. He developed headaches / blurred vision. He saw an Optometrist, recalls being told that his eye pressure was normal, they did dilate and look at the retina and he was told that was all fine as well. We lowered dose of Remicade to 5 mg / kg. He was started on Azathioprine. At time of last office visit in May 2023 he was doing well on treatment. He underwent  pouchoscopy to reassess disease activity. There was no active disease. Within the anal canal there were two small excavated areas (? fistulous openings) without drainage. In the most distal anal canal, two setons were exiting a fistulous opening. Also got an MR enterography / MRI  shwoing multiple bilateral transsphincteric perianal fistulae and two small abscesses in the left buttock subcutaneous tissues, significantly decreased in size compared to prior CT.  Patient admitted the end of March with an NSTEMI. He underwent craterization with DES to proximal LAD and PTCA to left circumflex. Started on Brillinta and asa.  Interval History:  Sean Schaefer admitted with nausea, vomiting and chest pain. Symptoms started last Thursday after Remicade infusion. Continued to have intermittent symptoms and found to be hypotensive at cardiac rehab. He hasn't had any hematemesis. No blood in stool. Having less frequent BMs but hasn't been eating much. The chest pain is not exertional. No SHOB. He has no history of reflux.   In ED he was mildly tachycardic with high BP. EKG showed sinus tachycardia . CXR negative for acute cardiopulmonary disease. Troponin normal x2. WBC normal. Hgb normal at 13.4. Tbili 1.4 ( predominantly indirect). Remainder of LFTs normal. Lipase normal. Cardiology saw him, doesn't feel pain is cardiac related.   CT AP w contrast shows  postoperative change of colectomy with ileoanal pouch anastomosis with findings suggestive pouchitis.Partially visualized perianal fistula.  No visible abscess. Cholelithiasis without evidence of cholecystitis.    Labs and Imaging: Recent Labs    12/09/22 1730 12/10/22 0131 12/11/22 0053  WBC 5.2 5.7 7.1  HGB 13.4 12.7* 12.2*  HCT 39.9 37.9* 36.3*  PLT 172 154 149*   Recent Labs    12/09/22 1730 12/10/22 0131 12/11/22 0053  NA 135 139 135  K 3.9 4.2 4.4  CL 99 106 101  CO2 22 19* 20*  GLUCOSE 183* 88 143*  BUN 19 17 21*  CREATININE 1.17 1.03 1.33*   CALCIUM 9.3 8.8* 8.3*   Recent Labs    12/09/22 1858 12/10/22 0131  PROT 7.7 7.5  ALBUMIN 3.4* 3.3*  AST 30 21  ALT 23 20  ALKPHOS 48 47  BILITOT 1.4* 1.4*  BILIDIR 0.2  --   IBILI 1.2*  --    No results for input(s): "HEPBSAG", "HCVAB", "HEPAIGM", "HEPBIGM" in the last 72 hours. No results for input(s): "LABPROT", "INR" in the last 72 hours.  Previous GI Evaluation:   May 2023 MRI enterography 1. Suspect continued inflammation of the patient's ileal pouch, potentially extending into the presacral region. Area not well assessed currently and the patient is being recalled for dedicated imaging of the pelvis extending into the upper thigh to fully assessed the perineum. 2. No gross inflammation surrounding small bowel loops in the abdomen and no sign of obstruction. 3. No ascites. Addendum: patient being recalled for dedicated pelvic fistula protocol  12/20/21 pouchoscopy - Intact post-surgical anastomosis found on digital exam. - The ileoanal pouch and neo-terminal ileum are normal. - No specimens collected. Within the   proximal to mid anal canal, two small excavated areas (? fistulous openings) were seen without drainage. In the most distal anal canal, two setons were exiting a fistulous opening. No drainage. (see photos)  12/31/21 MRI pelvis Multiple bilateral transsphincteric perianal fistulae, left side greater than right, as described above.   Two small abscesses in the left buttock subcutaneous tissues, measuring 4.1 cm and 1.6 cm, significantly decreased in size compared to prior CT.   Principal Problem:   Intractable nausea and vomiting Active Problems:   Crohn's disease (HCC)   Uncontrolled type 2 diabetes mellitus with hyperglycemia, with long-term current use of insulin (HCC)   Coronary artery disease involving native coronary artery of native heart with unstable angina pectoris (HCC)   Pouchitis (HCC)     Past Medical History:  Diagnosis Date   CAD  (coronary artery disease)    Crohn's disease (HCC)    Diabetes mellitus (HCC)    Dilation of aorta (HCC)    Hypertension    Ulcerative colitis (HCC)     Past Surgical History:  Procedure Laterality Date   COLON SURGERY     COLONOSCOPY     CORONARY BALLOON ANGIOPLASTY N/A 10/20/2022   Procedure: CORONARY BALLOON ANGIOPLASTY;  Surgeon: Harding, David W, MD;  Location: [facility];  Service: Cardiovascular;  Laterality: N/A;   CORONARY STENT INTERVENTION N/A 10/20/2022   Procedure: CORONARY STENT INTERVENTION;  Surgeon: Harding, David W, MD;  Location: [facility];  Service: Cardiovascular;  Laterality: N/A;   EYE SURGERY Left    FLEXIBLE SIGMOIDOSCOPY N/A 02/05/2021   Procedure: FLEXIBLE POUCHOSCOPY WITH BIOPSY;  Surgeon: White, Christopher M, MD;  Location: [facility];  Service: General;  Laterality: N/A;   INCISION AND DRAINAGE ABSCESS N/A 02/05/2021   Procedure: INCISION AND DRAINAGE OF PERIANAL ABSCESS;  Surgeon: White, Christopher M, MD;  Location: [facility];  Service: General;  Laterality: N/A;   INCISION AND DRAINAGE PERIRECTAL ABSCESS N/A 11/30/2020   Procedure: IRRIGATION AND DEBRIDEMENT PERIRECTAL ABSCESS;  Surgeon: Allen, Shelby L, MD;  Location: [facility];  Service: General;  Laterality: N/A;   INCISION AND DRAINAGE PERIRECTAL ABSCESS Left 04/03/2021   Procedure: left peri-rectal/gluteal abscess;  Surgeon: Lovick, Ayesha N, MD;  Location: [facility];  Service: General;  Laterality: Left;   LEFT HEART CATH AND CORONARY ANGIOGRAPHY N/A 10/20/2022   Procedure: LEFT HEART CATH AND CORONARY ANGIOGRAPHY;  Surgeon: Harding, David W, MD;  Location: [facility];  Service: Cardiovascular;  Laterality: N/A;   Perianal fistula repair  07/22/2010   PLACEMENT OF SETON N/A 02/05/2021   Procedure: PLACEMENT OF SETONS X2;  Surgeon: White, Christopher M, MD;  Location: [facility];  Service: General;  Laterality: N/A;   RECTAL EXAM UNDER ANESTHESIA N/A 02/05/2021   Procedure: ANORECTAL EXAM UNDER  ANESTHESIA;  Surgeon: White, Christopher M, MD;  Location: [facility];  Service: General;  Laterality: N/A;   TESTICLE REMOVAL Left 2000    Family History  Problem Relation Age of Onset   Hypertension Mother    Cancer Mother    Hypertension Father    Inflammatory bowel disease Brother    Diabetes Paternal Uncle    Diabetes Paternal Grandmother    Diabetes Paternal Grandfather    Heart disease Neg Hx    Colon cancer Neg Hx    Esophageal cancer Neg Hx    Rectal cancer Neg Hx    Stomach cancer Neg Hx     Prior to Admission medications   Medication Sig   Start Date End Date Taking? Authorizing Provider  aspirin EC 81 MG tablet Take 1 tablet (81 mg total) by mouth daily. Swallow whole. 10/23/22  Yes Joseph, Preetha, MD  azaTHIOprine (IMURAN) 50 MG tablet Take 50 mg by mouth daily.   Yes [provider]  carvedilol (COREG) 12.5 MG tablet Take 6.25 mg ( 1/2 tablet of 12.5 mg)  in the morning and 12.5 mg  in the evening 12/06/22  Yes Harding, David W, MD  empagliflozin (JARDIANCE) 10 MG TABS tablet Take 1 tablet (10 mg total) by mouth daily. 11/13/22  Yes Monge, Emily C, NP  irbesartan (AVAPRO) 150 MG tablet Take 0.5 tablets (75 mg total) by mouth daily. 12/06/22  Yes Harding, David W, MD  nitroGLYCERIN (NITROSTAT) 0.4 MG SL tablet Place 1 tablet (0.4 mg total) under the tongue every 5 (five) minutes as needed for chest pain. 11/04/22 02/02/23 Yes Monge, Emily C, NP  rosuvastatin (CRESTOR) 40 MG tablet Take 1 tablet (40 mg total) by mouth daily. 11/13/22  Yes Monge, Emily C, NP  ticagrelor (BRILINTA) 90 MG TABS tablet Take 1 tablet (90 mg) by mouth 2 times daily. 11/13/22 11/13/23 Yes Monge, Emily C, NP  Continuous Glucose Sensor (FREESTYLE LIBRE 14 DAY SENSOR) MISC Apply 1 sensor to the back of upper arm every 14 days 10/23/22     insulin degludec (TRESIBA FLEXTOUCH) 100 UNIT/ML FlexTouch Pen Inject 38 units into the skin once a day 90 days Patient not taking: Reported on 12/10/2022 01/14/22      insulin lispro (HUMALOG) 100 UNIT/ML injection Inject 8 Units into the skin 3 (three) times daily with meals. Patient not taking: Reported on 12/10/2022    [provider]    Current Facility-Administered Medications  Medication Dose Route Frequency Provider Last Rate Last Admin   0.9 %  sodium chloride infusion   Intravenous Continuous Ghimire, Kuber, MD 125 mL/hr at 12/10/22 1518 New Bag at 12/10/22 1518   acetaminophen (TYLENOL) tablet 650 mg  650 mg Oral Q6H PRN Opyd, Timothy S, MD       Or   acetaminophen (TYLENOL) suppository 650 mg  650 mg Rectal Q6H PRN Opyd, Timothy S, MD       aspirin EC tablet 81 mg  81 mg Oral Daily Opyd, Timothy S, MD   81 mg at 12/11/22 0829   azaTHIOprine (IMURAN) tablet 150 mg  150 mg Oral Daily Ghimire, Kuber, MD   150 mg at 12/11/22 0829   carvedilol (COREG) tablet 12.5 mg  12.5 mg Oral BID WC Haley, Sheng L, PA-C   12.5 mg at 12/11/22 0633   enoxaparin (LOVENOX) injection 40 mg  40 mg Subcutaneous Daily Opyd, Timothy S, MD   40 mg at 12/11/22 0830   famotidine (PEPCID) IVPB 20 mg premix  20 mg Intravenous Q12H Ghimire, Kuber, MD 100 mL/hr at 12/11/22 0942 20 mg at 12/11/22 0942   HYDROmorphone (DILAUDID) injection 0.5 mg  0.5 mg Intravenous Q3H PRN Opyd, Timothy S, MD   0.5 mg at 12/11/22 0937   insulin aspart (novoLOG) injection 0-9 Units  0-9 Units Subcutaneous Q4H Opyd, Timothy S, MD   1 Units at 12/10/22 2139   insulin glargine-yfgn (SEMGLEE) injection 12 Units  12 Units Subcutaneous QHS Opyd, Timothy S, MD   12 Units at 12/10/22 2138   irbesartan (AVAPRO) tablet 75 mg  75 mg Oral Daily Opyd, Timothy S, MD   75 mg at 12/11/22 0829   labetalol (NORMODYNE) injection 10 mg  10   mg Intravenous Q2H PRN Ghimire, Kuber, MD   10 mg at 12/11/22 1029   ondansetron (ZOFRAN) tablet 4 mg  4 mg Oral Q6H PRN Opyd, Timothy S, MD       Or   ondansetron (ZOFRAN) injection 4 mg  4 mg Intravenous Q6H PRN Opyd, Timothy S, MD   4 mg at 12/11/22 0637   promethazine  (PHENERGAN) 25 mg in sodium chloride 0.9 % 50 mL IVPB  25 mg Intravenous Q6H PRN Ghimire, Kuber, MD       rosuvastatin (CRESTOR) tablet 40 mg  40 mg Oral Daily Opyd, Timothy S, MD   40 mg at 12/11/22 0828   sodium chloride flush (NS) 0.9 % injection 3 mL  3 mL Intravenous Q12H Opyd, Timothy S, MD   3 mL at 12/11/22 0830   ticagrelor (BRILINTA) tablet 90 mg  90 mg Oral BID Opyd, Timothy S, MD   90 mg at 12/11/22 0829    Allergies as of 12/09/2022 - Reviewed 12/09/2022  Allergen Reaction Noted   Statins Other (See Comments) 02/09/2021    Social History   Socioeconomic History   Marital status: Married    Spouse name: Not on file   Number of children: 2   Years of education: Not on file   Highest education level: Not on file  Occupational History   Not on file  Tobacco Use   Smoking status: Never   Smokeless tobacco: Never  Vaping Use   Vaping Use: Never used  Substance and Sexual Activity   Alcohol use: No   Drug use: No   Sexual activity: Yes  Other Topics Concern   Not on file  Social History Narrative   Not on file   Social Determinants of Health   Financial Resource Strain: Not on file  Food Insecurity: No Food Insecurity (12/10/2022)   Hunger Vital Sign    Worried About Running Out of Food in the Last Year: Never true    Ran Out of Food in the Last Year: Never true  Transportation Needs: No Transportation Needs (12/10/2022)   PRAPARE - Transportation    Lack of Transportation (Medical): No    Lack of Transportation (Non-Medical): No  Physical Activity: Not on file  Stress: Not on file  Social Connections: Not on file  Intimate Partner Violence: Not At Risk (12/10/2022)   Humiliation, Afraid, Rape, and Kick questionnaire    Fear of Current or Ex-Partner: No    Emotionally Abused: No    Physically Abused: No    Sexually Abused: No     Code Status   Code Status: Full Code  Review of Systems: All systems reviewed and negative except where noted in  HPI.  Physical Exam: Vital signs in last 24 hours: Temp:  [97.8 F (36.6 C)-98.4 F (36.9 C)] 98.4 F (36.9 C) (05/22 1100) Pulse Rate:  [89-108] 90 (05/22 1100) Resp:  [18-20] 18 (05/22 1100) BP: (115-207)/(79-112) 187/100 (05/22 1100) SpO2:  [99 %-100 %] 99 % (05/22 1100) Weight:  [90.6 kg] 90.6 kg (05/22 0424) Last BM Date : 12/10/22  General:  Pleasant male in NAD Psych:  Cooperative. Normal mood and affect Eyes: Pupils equal Ears:  Normal auditory acuity Nose: No deformity, discharge or lesions Neck:  Supple, no masses felt Lungs:  Clear to auscultation.  Heart:  Regular rate, regular rhythm.  Abdomen:  Soft, nondistended, nontender, active bowel sounds, no masses felt Rectal :  Deferred Msk: Symmetrical without gross deformities.  Neurologic:  Alert,   oriented, grossly normal neurologically Extremities : No edema Skin:  Intact without significant lesions.    Intake/Output from previous day: No intake/output data recorded. Intake/Output this shift:  No intake/output data recorded.     Paula Guenther, NP-C @  12/11/2022, 11:13 AM     

## 2022-12-11 NOTE — Consult Note (Addendum)
Consultation Note   Referring Provider:  [facility] PCP: Sean Dills, MD Primary Gastroenterologist: Sean Jupiter, MD       Reason for consultation: nausea and vomiting  DOA: 12/09/2022         Hospital Day: 3   Attending physician's note  I have taken a history, reviewed the chart and examined the patient. I performed a substantive portion of this encounter, including complete performance of at least one of the key components, in conjunction with the APP. I agree with the APP's note, impression and recommendations.    57 year old male with history of fistulizing Crohn's disease currently on maintenance dose of azathioprine and infliximab admitted with chest pain, NSTEMI with nausea and vomiting. He was hypotensive prior to admission and is currently hypertensive with systolic blood pressure in the 200's  Will plan to proceed with EGD for further evaluation of atypical chest pain, nausea and vomiting.  Exclude severe erosive esophagitis or peptic ulcer The risks and benefits as well as alternatives of endoscopic procedure(s) have been discussed and reviewed. All questions answered. The patient agrees to proceed. Clear liquids as tolerated N.p.o. after midnight  Further recommendations based on EGD findings   The patient was provided an opportunity to ask questions and all were answered. The patient agreed with the plan and demonstrated an understanding of the instructions.  Sean Schaefer , MD [phone]     Assessment and Plan   57 yo male with perianal fistulizing Crohn's disease maintained on Remicade and Azathioprine. 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 Maintained on Azathioprine and Remicade and seemingly in clinical remission. No active disease on pouchoscopy a year ago though CT scan suggesting pouchitis. Currently admitted  with N/V and chest pain. -Continue Azathioprine -ESR, CRP  Nausea / vomiting and non-exertional chest pain x 1 week. Etiology unclear.  Had recent NSTEMI but Cardiology has evaluated and doesn't feel symptoms are cardiac related. He did recently start a daily baby asa so PUD is possible. Has cholelithiasis but no evidence for acute cholecystitis and not having abdominal pain.  Plan:  -Continue BID IV famotidine.  -Continue anti-emetics -May need EGD ( on Brillinta) when BP improves.   CAD / NSTEMI s/p PTCA and stenting 10/20/22. On Brillinta + ASA   History of Present Illness Patient is a 57 y.o. 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 At some point he had recurrent problems with a perianal abscess requiring surgery and seton placement. In 2015 he saw Dr. Loreta Schaefer, ? Put on Humira for Crohn's disease. He felt bad on treatment, stopped the medication and remained untreated for years. In 2022 he underwent a perianal abscess drainage by Dr. Cliffton Schaefer with [facility] who then referred him to [place] for Crohn's management. He established care with Dr. Myrtie Schaefer 04/03/21. Please refer to that note for further details. Following that visit he required hospitalization for treatment of  another perirectal / buttock abscess.   In Nov 2022 he was started on Remicade. He developed headaches / blurred vision. He saw an Optometrist, recalls being told that his eye pressure was normal, they did dilate and look at the retina and he was told that was all fine as well. We lowered dose of Remicade to 5 mg / kg. He was started on Azathioprine. At time of last office visit in May 2023 he was doing well on treatment. He underwent  pouchoscopy to reassess disease activity. There was no active disease. Within the anal canal there were two small excavated areas (? fistulous openings) without drainage. In the most distal anal canal, two setons were exiting a fistulous opening. Also got an MR enterography / MRI  shwoing multiple bilateral transsphincteric perianal fistulae and two small abscesses in the left buttock subcutaneous tissues, significantly decreased in size compared to prior CT.  Patient admitted the end of March with an NSTEMI. He underwent craterization with DES to proximal LAD and PTCA to left circumflex. Started on Success and asa.  Interval History:  Sean Schaefer admitted with nausea, vomiting and chest pain. Symptoms started last Thursday after Remicade infusion. Continued to have intermittent symptoms and found to be hypotensive at cardiac rehab. He hasn't had any hematemesis. No blood in stool. Having less frequent BMs but hasn't been eating much. The chest pain is not exertional. No SHOB. He has no history of reflux.   In ED he was mildly tachycardic with high BP. EKG showed sinus tachycardia . CXR negative for acute cardiopulmonary disease. Troponin normal x2. WBC normal. Hgb normal at 13.4. Tbili 1.4 ( predominantly indirect). Remainder of LFTs normal. Lipase normal. Cardiology saw him, doesn't feel pain is cardiac related.   CT AP w contrast shows  postoperative change of colectomy with ileoanal pouch anastomosis with findings suggestive pouchitis.Partially visualized perianal fistula.  No visible abscess. Cholelithiasis without evidence of cholecystitis.    Labs and Imaging: Recent Labs    12/09/22 1730 12/10/22 0131 12/11/22 0053  WBC 5.2 5.7 7.1  HGB 13.4 12.7* 12.2*  HCT 39.9 37.9* 36.3*  PLT 172 154 149*   Recent Labs    12/09/22 1730 12/10/22 0131 12/11/22 0053  NA 135 139 135  K 3.9 4.2 4.4  CL 99 106 101  CO2 22 19* 20*  GLUCOSE 183* 88 143*  BUN 19 17 21*  CREATININE 1.17 1.03 1.33*   CALCIUM 9.3 8.8* 8.3*   Recent Labs    12/09/22 1858 12/10/22 0131  PROT 7.7 7.5  ALBUMIN 3.4* 3.3*  AST 30 21  ALT 23 20  ALKPHOS 48 47  BILITOT 1.4* 1.4*  BILIDIR 0.2  --   IBILI 1.2*  --    No results for input(s): "HEPBSAG", "HCVAB", "HEPAIGM", "HEPBIGM" in the last 72 hours. No results for input(s): "LABPROT", "INR" in the last 72 hours.  Previous GI Evaluation:   May 2023 MRI enterography 1. Suspect continued inflammation of the patient's ileal pouch, potentially extending into the presacral region. Area not well assessed currently and the patient is being recalled for dedicated imaging of the pelvis extending into the upper thigh to fully assessed the perineum. 2. No gross inflammation surrounding small bowel loops in the abdomen and no sign of obstruction. 3. No ascites. Addendum: patient being recalled for dedicated pelvic fistula protocol  12/20/21 pouchoscopy - Intact post-surgical anastomosis found on digital exam. - The ileoanal pouch and neo-terminal ileum are normal. - No specimens collected. Within the  proximal to mid anal canal, two small excavated areas (? fistulous openings) were seen without drainage. In the most distal anal canal, two setons were exiting a fistulous opening. No drainage. (see photos)  12/31/21 MRI pelvis Multiple bilateral transsphincteric perianal fistulae, left side greater than right, as described above.   Two small abscesses in the left buttock subcutaneous tissues, measuring 4.1 cm and 1.6 cm, significantly decreased in size compared to prior CT.   Principal Problem:   Intractable nausea and vomiting Active Problems:   Crohn's disease (HCC)   Uncontrolled type 2 diabetes mellitus with hyperglycemia, with long-term current use of insulin (HCC)   Coronary artery disease involving native coronary artery of native heart with unstable angina pectoris (HCC)   Pouchitis (HCC)     Past Medical History:  Diagnosis Date   CAD  (coronary artery disease)    Crohn's disease (HCC)    Diabetes mellitus (HCC)    Dilation of aorta (HCC)    Hypertension    Ulcerative colitis (HCC)     Past Surgical History:  Procedure Laterality Date   COLON SURGERY     COLONOSCOPY     CORONARY BALLOON ANGIOPLASTY N/A 10/20/2022   Procedure: CORONARY BALLOON ANGIOPLASTY;  Surgeon: Marykay Lex, MD;  Location: [facility] INVASIVE CV LAB;  Service: Cardiovascular;  Laterality: N/A;   CORONARY STENT INTERVENTION N/A 10/20/2022   Procedure: CORONARY STENT INTERVENTION;  Surgeon: Marykay Lex, MD;  Location[facility] INV[facility] CV LAB;  Service: Cardiovascular;  Laterality: N/A;   EYE SURGERY Left    FLEXIBLE SIGMOIDOSCOPY N/A 02/05/2021   Procedure: FLEXIBLE POUCHOSCOPY WITH BIOPSY;  Surgeon: Andria Meuse, MD;  Location: [facility] ORS;  Service: General;  Laterality: N/A;   INCISION AND DRAINAGE ABSCESS N/A 02/05/2021   Procedure: INCISION AND DRAINAGE OF PERIANAL ABSCESS;  Surgeon: Andria Meuse, MD;  Location: [facility] ORS;  Service: General;  Laterality: N/A;   INCISION AND DRAINAGE PERIRECTAL ABSCESS N/A 11/30/2020   Procedure: IRRIGATION AND DEBRIDEMENT PERIRECTAL ABSCESS;  Surgeon: Fritzi Mandes, MD;  Location: [facility] OR;  Service: General;  Laterality: N/A;   INCISION AND DRAINAGE PERIRECTAL ABSCESS Left 04/03/2021   Procedure: left peri-rectal/gluteal abscess;  Surgeon: Diamantina Monks, MD;  Location: [facility] OR;  Service: General;  Laterality: Left;   LEFT HEART CATH AND CORONARY ANGIOGRAPHY N/A 10/20/2022   Procedure: LEFT HEART CATH AND CORONARY ANGIOGRAPHY;  Surgeon: Marykay Lex, MD;  Location: [facility] INVASIVE CV LAB;  Service: Cardiovascular;  Laterality: N/A;   Perianal fistula repair  07/22/2010   PLACEMENT OF SETON N/A 02/05/2021   Procedure: PLACEMENT OF SETONS X2;  Surgeon: Andria Meuse, MD;  Location: [facility] ORS;  Service: General;  Laterality: N/A;   RECTAL EXAM UNDER ANESTHESIA N/A 02/05/2021   Procedure: ANORECTAL EXAM UNDER  ANESTHESIA;  Surgeon: Andria Meuse, MD;  Location: [facility] ORS;  Service: General;  Laterality: N/A;   TESTICLE REMOVAL Left 2000    Family History  Problem Relation Age of Onset   Hypertension Mother    Cancer Mother    Hypertension Father    Inflammatory bowel disease Brother    Diabetes Paternal Uncle    Diabetes Paternal Grandmother    Diabetes Paternal Grandfather    Heart disease Neg Hx    Colon cancer Neg Hx    Esophageal cancer Neg Hx    Rectal cancer Neg Hx    Stomach cancer Neg Hx     Prior to Admission medications   Medication Sig  Start Date End Date Taking? Authorizing Provider  aspirin EC 81 MG tablet Take 1 tablet (81 mg total) by mouth daily. Swallow whole. 10/23/22  Yes Zannie Cove, MD  azaTHIOprine (IMURAN) 50 MG tablet Take 50 mg by mouth daily.   Yes [provider]  carvedilol (COREG) 12.5 MG tablet Take 6.25 mg ( 1/2 tablet of 12.5 mg)  in the morning and 12.5 mg  in the evening 12/06/22  Yes Marykay Lex, MD  empagliflozin (JARDIANCE) 10 MG TABS tablet Take 1 tablet (10 mg total) by mouth daily. 11/13/22  Yes Monge, Petra Kuba, NP  irbesartan (AVAPRO) 150 MG tablet Take 0.5 tablets (75 mg total) by mouth daily. 12/06/22  Yes Marykay Lex, MD  nitroGLYCERIN (NITROSTAT) 0.4 MG SL tablet Place 1 tablet (0.4 mg total) under the tongue every 5 (five) minutes as needed for chest pain. 11/04/22 02/02/23 Yes Monge, Petra Kuba, NP  rosuvastatin (CRESTOR) 40 MG tablet Take 1 tablet (40 mg total) by mouth daily. 11/13/22  Yes Monge, Petra Kuba, NP  ticagrelor (BRILINTA) 90 MG TABS tablet Take 1 tablet (90 mg) by mouth 2 times daily. 11/13/22 11/13/23 Yes Monge, Petra Kuba, NP  Continuous Glucose Sensor (FREESTYLE LIBRE 14 DAY SENSOR) MISC Apply 1 sensor to the back of upper arm every 14 days 10/23/22     insulin degludec (TRESIBA FLEXTOUCH) 100 UNIT/ML FlexTouch Pen Inject 38 units into the skin once a day 90 days Patient not taking: Reported on 12/10/2022 01/14/22      insulin lispro (HUMALOG) 100 UNIT/ML injection Inject 8 Units into the skin 3 (three) times daily with meals. Patient not taking: Reported on 12/10/2022    [provider]    Current Facility-Administered Medications  Medication Dose Route Frequency Provider Last Rate Last Admin   0.9 %  sodium chloride infusion   Intravenous Continuous Dorcas Carrow, MD 125 mL/hr at 12/10/22 1518 New Bag at 12/10/22 1518   acetaminophen (TYLENOL) tablet 650 mg  650 mg Oral Q6H PRN Opyd, Lavone Neri, MD       Or   acetaminophen (TYLENOL) suppository 650 mg  650 mg Rectal Q6H PRN Opyd, Lavone Neri, MD       aspirin EC tablet 81 mg  81 mg Oral Daily Opyd, Lavone Neri, MD   81 mg at 12/11/22 0829   azaTHIOprine (IMURAN) tablet 150 mg  150 mg Oral Daily Dorcas Carrow, MD   150 mg at 12/11/22 0829   carvedilol (COREG) tablet 12.5 mg  12.5 mg Oral BID WC Haley, Sheng L, PA-C   12.5 mg at 12/11/22 1610   enoxaparin (LOVENOX) injection 40 mg  40 mg Subcutaneous Daily Opyd, Lavone Neri, MD   40 mg at 12/11/22 0830   famotidine (PEPCID) IVPB 20 mg premix  20 mg Intravenous Q12H Dorcas Carrow, MD 100 mL/hr at 12/11/22 0942 20 mg at 12/11/22 0942   HYDROmorphone (DILAUDID) injection 0.5 mg  0.5 mg Intravenous Q3H PRN Briscoe Deutscher, MD   0.5 mg at 12/11/22 0937   insulin aspart (novoLOG) injection 0-9 Units  0-9 Units Subcutaneous Q4H Opyd, Lavone Neri, MD   1 Units at 12/10/22 2139   insulin glargine-yfgn (SEMGLEE) injection 12 Units  12 Units Subcutaneous QHS Briscoe Deutscher, MD   12 Units at 12/10/22 2138   irbesartan (AVAPRO) tablet 75 mg  75 mg Oral Daily Opyd, Lavone Neri, MD   75 mg at 12/11/22 0829   labetalol (NORMODYNE) injection 10 mg  10  mg Intravenous Q2H PRN Dorcas Carrow, MD   10 mg at 12/11/22 1029   ondansetron (ZOFRAN) tablet 4 mg  4 mg Oral Q6H PRN Opyd, Lavone Neri, MD       Or   ondansetron (ZOFRAN) injection 4 mg  4 mg Intravenous Q6H PRN Opyd, Lavone Neri, MD   4 mg at 12/11/22 7846   promethazine  (PHENERGAN) 25 mg in sodium chloride 0.9 % 50 mL IVPB  25 mg Intravenous Q6H PRN Dorcas Carrow, MD       rosuvastatin (CRESTOR) tablet 40 mg  40 mg Oral Daily Opyd, Lavone Neri, MD   40 mg at 12/11/22 9629   sodium chloride flush (NS) 0.9 % injection 3 mL  3 mL Intravenous Q12H Opyd, Lavone Neri, MD   3 mL at 12/11/22 0830   ticagrelor (BRILINTA) tablet 90 mg  90 mg Oral BID Briscoe Deutscher, MD   90 mg at 12/11/22 5284    Allergies as of 12/09/2022 - Reviewed 12/09/2022  Allergen Reaction Noted   Statins Other (See Comments) 02/09/2021    Social History   Socioeconomic History   Marital status: Married    Spouse name: Not on file   Number of children: 2   Years of education: Not on file   Highest education level: Not on file  Occupational History   Not on file  Tobacco Use   Smoking status: Never   Smokeless tobacco: Never  Vaping Use   Vaping Use: Never used  Substance and Sexual Activity   Alcohol use: No   Drug use: No   Sexual activity: Yes  Other Topics Concern   Not on file  Social History Narrative   Not on file   Social Determinants of Health   Financial Resource Strain: Not on file  Food Insecurity: No Food Insecurity (12/10/2022)   Hunger Vital Sign    Worried About Running Out of Food in the Last Year: Never true    Ran Out of Food in the Last Year: Never true  Transportation Needs: No Transportation Needs (12/10/2022)   PRAPARE - Administrator, Civil Service (Medical): No    Lack of Transportation (Non-Medical): No  Physical Activity: Not on file  Stress: Not on file  Social Connections: Not on file  Intimate Partner Violence: Not At Risk (12/10/2022)   Humiliation, Afraid, Rape, and Kick questionnaire    Fear of Current or Ex-Partner: No    Emotionally Abused: No    Physically Abused: No    Sexually Abused: No     Code Status   Code Status: Full Code  Review of Systems: All systems reviewed and negative except where noted in  HPI.  Physical Exam: Vital signs in last 24 hours: Temp:  [97.8 F (36.6 C)-98.4 F (36.9 C)] 98.4 F (36.9 C) (05/22 1100) Pulse Rate:  [89-108] 90 (05/22 1100) Resp:  [18-20] 18 (05/22 1100) BP: (115-207)/(79-112) 187/100 (05/22 1100) SpO2:  [99 %-100 %] 99 % (05/22 1100) Weight:  [90.6 kg] 90.6 kg (05/22 0424) Last BM Date : 12/10/22  General:  Pleasant male in NAD Psych:  Cooperative. Normal mood and affect Eyes: Pupils equal Ears:  Normal auditory acuity Nose: No deformity, discharge or lesions Neck:  Supple, no masses felt Lungs:  Clear to auscultation.  Heart:  Regular rate, regular rhythm.  Abdomen:  Soft, nondistended, nontender, active bowel sounds, no masses felt Rectal :  Deferred Msk: Symmetrical without gross deformities.  Neurologic:  Alert,  oriented, grossly normal neurologically Extremities : No edema Skin:  Intact without significant lesions.    Intake/Output from previous day: No intake/output data recorded. Intake/Output this shift:  No intake/output data recorded.     Willette Cluster, NP-C @  12/11/2022, 11:13 AM

## 2022-12-11 NOTE — TOC CM/SW Note (Signed)
Transition of Care [facility]) - Inpatient Brief Assessment   Patient Details  Name: Sean Schaefer MRN: [id number] Date of Birth: June 23, 1966  [facility] CM/SW Contact:    Leone Haven, RN Phone Number: 12/11/2022, 5:01 PM   Clinical Narrative: From home with wife, indep, has PCP on file and insurance.  Wife will transport him home at dc.  If any additional needs needed please consult TOC .   Transition of Care Asessment: Insurance and Status: Insurance coverage has been reviewed Patient has primary care physician: Yes Home environment has been reviewed: home with spouse Prior level of function:: indep Prior/Current Home Services: No current home services Social Determinants of Health Reivew: SDOH reviewed no interventions necessary Readmission risk has been reviewed: Yes Transition of care needs: no transition of care needs at this time

## 2022-12-11 NOTE — Plan of Care (Signed)
  Problem: Coping: Goal: Ability to adjust to condition or change in health will improve Outcome: Progressing   Problem: Fluid Volume: Goal: Ability to maintain a balanced intake and output will improve Outcome: Progressing   Problem: Metabolic: Goal: Ability to maintain appropriate glucose levels will improve Outcome: Progressing   Problem: Health Behavior/Discharge Planning: Goal: Ability to manage health-related needs will improve Outcome: Progressing   Problem: Clinical Measurements: Goal: Will remain free from infection Outcome: Progressing   Problem: Nutritional: Goal: Maintenance of adequate nutrition will improve Outcome: Not Met (add Reason) Note: Poor appetite.   Problem: Skin Integrity: Goal: Risk for impaired skin integrity will decrease Outcome: Not Applicable

## 2022-12-12 ENCOUNTER — Other Ambulatory Visit (HOSPITAL_COMMUNITY): Payer: Self-pay

## 2022-12-12 ENCOUNTER — Encounter (HOSPITAL_COMMUNITY): Payer: Self-pay | Admitting: Family Medicine

## 2022-12-12 ENCOUNTER — Encounter (HOSPITAL_COMMUNITY)
Admission: EM | Disposition: A | Discharge status: Home / Self Care | Payer: Self-pay | Source: Home / Self Care | Attending: Internal Medicine

## 2022-12-12 ENCOUNTER — Inpatient Hospital Stay (HOSPITAL_COMMUNITY): Payer: 59 | Admitting: Anesthesiology

## 2022-12-12 DIAGNOSIS — K449 Diaphragmatic hernia without obstruction or gangrene: Secondary | ICD-10-CM

## 2022-12-12 DIAGNOSIS — K2100 Gastro-esophageal reflux disease with esophagitis, without bleeding: Secondary | ICD-10-CM | POA: Diagnosis not present

## 2022-12-12 DIAGNOSIS — R112 Nausea with vomiting, unspecified: Secondary | ICD-10-CM | POA: Diagnosis not present

## 2022-12-12 DIAGNOSIS — I10 Essential (primary) hypertension: Secondary | ICD-10-CM

## 2022-12-12 DIAGNOSIS — Z955 Presence of coronary angioplasty implant and graft: Secondary | ICD-10-CM

## 2022-12-12 DIAGNOSIS — R079 Chest pain, unspecified: Secondary | ICD-10-CM

## 2022-12-12 DIAGNOSIS — I2510 Atherosclerotic heart disease of native coronary artery without angina pectoris: Secondary | ICD-10-CM

## 2022-12-12 DIAGNOSIS — I252 Old myocardial infarction: Secondary | ICD-10-CM

## 2022-12-12 HISTORY — PX: ESOPHAGOGASTRODUODENOSCOPY (EGD) WITH PROPOFOL: SHX5813

## 2022-12-12 HISTORY — PX: BIOPSY: SHX5522

## 2022-12-12 LAB — CBC
HCT: 36.4 % — ABNORMAL LOW (ref 39.0–52.0)
Hemoglobin: 12.5 g/dL — ABNORMAL LOW (ref 13.0–17.0)
MCH: 33.2 pg (ref 26.0–34.0)
MCHC: 34.3 g/dL (ref 30.0–36.0)
MCV: 96.8 fL (ref 80.0–100.0)
Platelets: 139 10*3/uL — ABNORMAL LOW (ref 150–400)
RBC: 3.76 MIL/uL — ABNORMAL LOW (ref 4.22–5.81)
RDW: 14.4 % (ref 11.5–15.5)
WBC: 5.3 10*3/uL (ref 4.0–10.5)
nRBC: 0 % (ref 0.0–0.2)

## 2022-12-12 LAB — GLUCOSE, CAPILLARY
Glucose-Capillary: 103 mg/dL — ABNORMAL HIGH (ref 70–99)
Glucose-Capillary: 104 mg/dL — ABNORMAL HIGH (ref 70–99)
Glucose-Capillary: 123 mg/dL — ABNORMAL HIGH (ref 70–99)
Glucose-Capillary: 125 mg/dL — ABNORMAL HIGH (ref 70–99)
Glucose-Capillary: 125 mg/dL — ABNORMAL HIGH (ref 70–99)
Glucose-Capillary: 91 mg/dL (ref 70–99)
Glucose-Capillary: 96 mg/dL (ref 70–99)

## 2022-12-12 LAB — TROPONIN I (HIGH SENSITIVITY)
Troponin I (High Sensitivity): 13 ng/L (ref <18)
Troponin I (High Sensitivity): 14 ng/L (ref <18)

## 2022-12-12 SURGERY — ESOPHAGOGASTRODUODENOSCOPY (EGD) WITH PROPOFOL
Anesthesia: Monitor Anesthesia Care

## 2022-12-12 MED ORDER — PROPOFOL 10 MG/ML IV BOLUS
INTRAVENOUS | Status: DC | PRN
  Administered 2022-12-12: 10 mg via INTRAVENOUS
  Administered 2022-12-12 (×2): 20 mg via INTRAVENOUS

## 2022-12-12 MED ORDER — NITROGLYCERIN 0.4 MG SL SUBL
0.4000 mg | SUBLINGUAL_TABLET | Freq: Once | SUBLINGUAL | Status: AC
  Administered 2022-12-12: 0.4 mg via SUBLINGUAL

## 2022-12-12 MED ORDER — NITROGLYCERIN IN D5W 200-5 MCG/ML-% IV SOLN
0.0000 ug/min | INTRAVENOUS | Status: DC
  Administered 2022-12-12: 5 ug/min via INTRAVENOUS
  Filled 2022-12-12: qty 250

## 2022-12-12 MED ORDER — PANTOPRAZOLE SODIUM 40 MG IV SOLR
40.0000 mg | Freq: Two times a day (BID) | INTRAVENOUS | Status: DC
  Administered 2022-12-12 – 2022-12-13 (×2): 40 mg via INTRAVENOUS
  Filled 2022-12-12: qty 10

## 2022-12-12 MED ORDER — HYDRALAZINE HCL 20 MG/ML IJ SOLN
INTRAMUSCULAR | Status: AC
  Filled 2022-12-12: qty 1

## 2022-12-12 MED ORDER — LABETALOL HCL 5 MG/ML IV SOLN
10.0000 mg | Freq: Once | INTRAVENOUS | Status: AC
  Administered 2022-12-12: 10 mg via INTRAVENOUS

## 2022-12-12 MED ORDER — SODIUM CHLORIDE 0.9 % IV SOLN: INTRAVENOUS | Status: DC

## 2022-12-12 MED ORDER — NITROGLYCERIN 0.4 MG SL SUBL
SUBLINGUAL_TABLET | SUBLINGUAL | Status: AC
  Administered 2022-12-12: 0.4 mg via SUBLINGUAL
  Filled 2022-12-12: qty 1

## 2022-12-12 MED ORDER — FENTANYL CITRATE (PF) 100 MCG/2ML IJ SOLN
50.0000 ug | Freq: Once | INTRAMUSCULAR | Status: AC
  Administered 2022-12-12: 50 ug via INTRAVENOUS

## 2022-12-12 MED ORDER — LACTATED RINGERS IV SOLN: INTRAVENOUS | Status: DC

## 2022-12-12 MED ORDER — LABETALOL HCL 5 MG/ML IV SOLN
5.0000 mg | Freq: Once | INTRAVENOUS | Status: AC
  Administered 2022-12-12: 5 mg via INTRAVENOUS

## 2022-12-12 MED ORDER — HYDRALAZINE HCL 20 MG/ML IJ SOLN
10.0000 mg | Freq: Once | INTRAMUSCULAR | Status: AC
  Administered 2022-12-12: 10 mg via INTRAVENOUS

## 2022-12-12 MED ORDER — ONDANSETRON HCL 4 MG/2ML IJ SOLN
INTRAMUSCULAR | Status: AC
  Filled 2022-12-12: qty 2

## 2022-12-12 MED ORDER — PANTOPRAZOLE SODIUM 40 MG PO TBEC: 40.0000 mg | DELAYED_RELEASE_TABLET | Freq: Two times a day (BID) | ORAL | Status: DC

## 2022-12-12 MED ORDER — LABETALOL HCL 5 MG/ML IV SOLN
INTRAVENOUS | Status: AC
  Filled 2022-12-12: qty 4

## 2022-12-12 MED ORDER — ONDANSETRON HCL 4 MG/2ML IJ SOLN
4.0000 mg | Freq: Once | INTRAMUSCULAR | Status: AC
  Administered 2022-12-12: 4 mg via INTRAVENOUS

## 2022-12-12 MED ORDER — FENTANYL CITRATE (PF) 100 MCG/2ML IJ SOLN
INTRAMUSCULAR | Status: AC
  Filled 2022-12-12: qty 2

## 2022-12-12 MED ORDER — PROPOFOL 500 MG/50ML IV EMUL
INTRAVENOUS | Status: DC | PRN
  Administered 2022-12-12: 125 ug/kg/min via INTRAVENOUS

## 2022-12-12 MED ORDER — LIDOCAINE 2% (20 MG/ML) 5 ML SYRINGE
INTRAMUSCULAR | Status: DC | PRN
  Administered 2022-12-12: 20 mg via INTRAVENOUS

## 2022-12-12 SURGICAL SUPPLY — 15 items

## 2022-12-12 NOTE — Anesthesia Postprocedure Evaluation (Signed)
Anesthesia Post Note  Patient: Sean Schaefer  Procedure(s) Performed: ESOPHAGOGASTRODUODENOSCOPY (EGD) WITH PROPOFOL BIOPSY     Patient location during evaluation: PACU Anesthesia Type: MAC Level of consciousness: awake and alert Pain management: pain level controlled Vital Signs Assessment: post-procedure vital signs reviewed and stable Respiratory status: spontaneous breathing, nonlabored ventilation and respiratory function stable Cardiovascular status: stable and blood pressure returned to baseline Anesthetic complications: no   No notable events documented.  Last Vitals:  Vitals:   12/12/22 1410 12/12/22 1424  BP: (!) 177/92 (!) 180/94  Pulse: 82 77  Resp: 15 18  Temp:  36.8 C  SpO2: 100% 100%    Last Pain:  Vitals:   12/12/22 1424  TempSrc: Oral  PainSc:                  Beryle Lathe

## 2022-12-12 NOTE — Progress Notes (Signed)
   12/12/22 1730  Vitals  BP (!) 205/99  MAP (mmHg) 129  Pulse Rate 89  ECG Heart Rate 91  MEWS COLOR  MEWS Score Color Yellow  Oxygen Therapy  SpO2 100 %  MEWS Score  MEWS Temp 0  MEWS Systolic 2  MEWS Pulse 0  MEWS RR 0  MEWS LOC 0  MEWS Score 2   Ghimire MD made aware. Patient unable to receive prn IV labetalol. Chest pressure of 4/10. Nitro gtt initiated to control BP and chest pain.

## 2022-12-12 NOTE — Progress Notes (Signed)
PROGRESS NOTE    Sean Schaefer  ZOX:[id number] DOB: 06-30-1966 DOA: 12/09/2022 PCP: Renford Dills, MD    Brief Narrative:  Patient is 57 year old gentleman with recent history of non-STEMI status postcardiac cath and stenting, Crohn's disease with fistula, insulin-dependent type 2 diabetes, hypertension who presents to the emergency room with about 1 week of ongoing nausea, vomiting and chest discomfort.  He had episode of dizziness at cardiac rehab.  Chest pain started even before vomiting. In the emergency room hemodynamically stable.  Troponins negative.  CT scan abdomen pelvis notable for postoperative changes of colectomy and ileoanal pouch inflammation otherwise normal.  Admitted due to significant symptoms. With some clinical improvement underwent upper GI endoscopy 5/23, he started having recurrent symptoms nausea and midsternal chest pain after the procedure.   Assessment & Plan:   Intractable nausea vomiting: Had some improvement of symptoms after conservative management. Underwent EGD 5/23, hypertension, nausea and midsternal chest pain after the procedure. Continue Phenergan and Zofran.  IV fluids.  Not tolerating advancing diet. Pepcid added. Abdominal exam is benign. Patient with normal bowel function. CT scan abdomen pelvis without any significant findings.  Unlikely infection.  Will discontinue antibiotics.    Chest pain in a patient with coronary artery disease and recent LAD stent: Symptoms exacerbated after working at cardiac rehab. Troponins have been negative.  EKG nonischemic. Patient remains on aspirin and Brilinta, statin and beta-blocker. Due to ongoing chest discomfort, seen by cardiology.   Not recommending any further ischemic evaluation.  Crohn's disease with fistula, pouchitis: This is likely chronic findings and related to inflammation.  Not infection.  Antibiotics discontinued.  Essential hypertension with fluctuating blood pressures: Resume Coreg and  irbesartan.  Monitor.  As needed labetalol.  Exacerbated by nausea. Use labetalol as needed.  Type 2 diabetes: Recent A1c of 12.1.  No reliable oral intake.  Avoid hypoglycemia.  On lower dose of long-acting insulin and sliding scale insulin.  DVT prophylaxis: enoxaparin (LOVENOX) injection 40 mg Start: 12/10/22 1000   Code Status: Full code Family Communication: Wife at the bedside. Disposition Plan: Status is: Inpatient.  Remains inpatient due to persistent nausea vomiting.   Consultants:  Cardiology Gastroenterology  Procedures:  Upper GI endoscopy.  Normal findings.  Antimicrobials:  Rocephin Flagyl 5/20-5/21   Subjective:  Patient examined in the morning rounds.  He did not have any issues and had not experienced any nausea or vomiting for the last 12 hours he underwent upper GI endoscopy.  Came back with nausea, vomiting, hypertensive with chest discomfort. EKG nonischemic. Chest pain improved with Dilaudid. Troponins pending.  Objective: Vitals:   12/12/22 1400 12/12/22 1410 12/12/22 1424 12/12/22 1501  BP: (!) 187/92 (!) 177/92 (!) 180/94 (!) 174/96  Pulse: 85 82 77 83  Resp: 12 15 18 16   Temp:   98.3 F (36.8 C)   TempSrc:   Oral   SpO2: 100% 100% 100% 100%  Weight:      Height:        Intake/Output Summary (Last 24 hours) at 12/12/2022 1543 Last data filed at 12/12/2022 0818 Gross per 24 hour  Intake 530 ml  Output 0 ml  Net 530 ml    Filed Weights   12/11/22 0424 12/12/22 0445 12/12/22 1028  Weight: 90.6 kg 87.7 kg 87.5 kg    Examination:  General exam: Appears anxious. Respiratory system: No added sounds. Cardiovascular system: S1 & S2 heard, RRR.  Gastrointestinal system: Soft.  Nontender.  Bowel sound present.  Surgical scars are nontender.  Central nervous system: Alert and oriented. No focal neurological deficits. Extremities: Symmetric 5 x 5 power. Skin: No rashes, lesions or ulcers Psychiatry: Judgement and insight appear normal.   Anxious.    Data Reviewed: I have personally reviewed following labs and imaging studies  CBC: Recent Labs  Lab 12/09/22 1730 12/10/22 0131 12/11/22 0053 12/12/22 0051  WBC 5.2 5.7 7.1 5.3  HGB 13.4 12.7* 12.2* 12.5*  HCT 39.9 37.9* 36.3* 36.4*  MCV 96.6 98.2 97.1 96.8  PLT 172 154 149* 139*    Basic Metabolic Panel: Recent Labs  Lab 12/09/22 1730 12/10/22 0131 12/11/22 0053  NA 135 139 135  K 3.9 4.2 4.4  CL 99 106 101  CO2 22 19* 20*  GLUCOSE 183* 88 143*  BUN 19 17 21*  CREATININE 1.17 1.03 1.33*  CALCIUM 9.3 8.8* 8.3*  MG  --  2.0  --     GFR: Estimated Creatinine Clearance: 65.6 mL/min (A) (by C-G formula based on SCr of 1.33 mg/dL (H)). Liver Function Tests: Recent Labs  Lab 12/09/22 1858 12/10/22 0131  AST 30 21  ALT 23 20  ALKPHOS 48 47  BILITOT 1.4* 1.4*  PROT 7.7 7.5  ALBUMIN 3.4* 3.3*    Recent Labs  Lab 12/09/22 1858  LIPASE 40    No results for input(s): "AMMONIA" in the last 168 hours. Coagulation Profile: No results for input(s): "INR", "PROTIME" in the last 168 hours. Cardiac Enzymes: No results for input(s): "CKTOTAL", "CKMB", "CKMBINDEX", "TROPONINI" in the last 168 hours. BNP (last 3 results) No results for input(s): "PROBNP" in the last 8760 hours. HbA1C: No results for input(s): "HGBA1C" in the last 72 hours. CBG: Recent Labs  Lab 12/11/22 2304 12/12/22 0444 12/12/22 0750 12/12/22 1207 12/12/22 1422  GLUCAP 97 91 96 103* 104*    Lipid Profile: No results for input(s): "CHOL", "HDL", "LDLCALC", "TRIG", "CHOLHDL", "LDLDIRECT" in the last 72 hours. Thyroid Function Tests: No results for input(s): "TSH", "T4TOTAL", "FREET4", "T3FREE", "THYROIDAB" in the last 72 hours. Anemia Panel: No results for input(s): "VITAMINB12", "FOLATE", "FERRITIN", "TIBC", "IRON", "RETICCTPCT" in the last 72 hours. Sepsis Labs: No results for input(s): "PROCALCITON", "LATICACIDVEN" in the last 168 hours.  No results found for this or any  previous visit (from the past 240 hour(s)).       Radiology Studies: No results found.      Scheduled Meds:  aspirin EC  81 mg Oral Daily   azaTHIOprine  150 mg Oral Daily   carvedilol  12.5 mg Oral BID WC   enoxaparin (LOVENOX) injection  40 mg Subcutaneous Daily   insulin aspart  0-9 Units Subcutaneous Q4H   insulin glargine-yfgn  12 Units Subcutaneous QHS   irbesartan  75 mg Oral Daily   pantoprazole  40 mg Oral BID   rosuvastatin  40 mg Oral Daily   sodium chloride flush  3 mL Intravenous Q12H   ticagrelor  90 mg Oral BID   Continuous Infusions:  famotidine (PEPCID) IV 20 mg (12/12/22 1453)   promethazine (PHENERGAN) injection (IM or IVPB) 25 mg (12/12/22 1538)     LOS: 2 days    Time spent: 35 minutes    Dorcas Carrow, MD [facility] Pager [phone]

## 2022-12-12 NOTE — Anesthesia Preprocedure Evaluation (Addendum)
Anesthesia Evaluation  Patient identified by MRN, date of birth, ID band Patient awake    Reviewed: Allergy & Precautions, NPO status , Patient's Chart, lab work & pertinent test results  History of Anesthesia Complications Negative for: history of anesthetic complications  Airway Mallampati: I  TM Distance: >3 FB Neck ROM: Full    Dental  (+) Dental Advisory Given, Teeth Intact   Pulmonary neg pulmonary ROS   Pulmonary exam normal        Cardiovascular hypertension, Pt. on home beta blockers and Pt. on medications + CAD, + Past MI and + Cardiac Stents (~ 2 mo ago)  Normal cardiovascular exam   '24 TTE - EF 70 to 75%. There is mild left ventricular hypertrophy. Trivial mitral valve regurgitation. There is moderate dilatation of the ascending aorta, measuring 43 mm.     Neuro/Psych negative neurological ROS  negative psych ROS   GI/Hepatic Neg liver ROS, PUD,,, UC, Crohn's disease    Endo/Other  diabetes, Type 2, Insulin Dependent, Oral Hypoglycemic Agents    Renal/GU Renal InsufficiencyRenal disease     Musculoskeletal negative musculoskeletal ROS (+)    Abdominal   Peds  Hematology  (+) Blood dyscrasia, anemia  Plt 139k On brilinta    Anesthesia Other Findings   Reproductive/Obstetrics                             Anesthesia Physical Anesthesia Plan  ASA: 4  Anesthesia Plan: MAC   Post-op Pain Management: Minimal or no pain anticipated   Induction:   PONV Risk Score and Plan: 1 and Propofol infusion and Treatment may vary due to age or medical condition  Airway Management Planned: Nasal Cannula and Natural Airway  Additional Equipment: None  Intra-op Plan:   Post-operative Plan:   Informed Consent: I have reviewed the patients History and Physical, chart, labs and discussed the procedure including the risks, benefits and alternatives for the proposed anesthesia with  the patient or authorized representative who has indicated his/her understanding and acceptance.       Plan Discussed with: CRNA and Anesthesiologist  Anesthesia Plan Comments:         Anesthesia Quick Evaluation

## 2022-12-12 NOTE — Interval H&P Note (Signed)
History and Physical Interval Note: EGD to evaluate nausea / vomiting. He is feeling better since admission. No changes otherwise. Have discussed risks / benefits he wishes to proceed. He has taken Brilinta recently.  12/12/2022 11:37 AM  Sean Schaefer  has presented today for surgery, with the diagnosis of nausea, vomiting and chest pain.  The various methods of treatment have been discussed with the patient and family. After consideration of risks, benefits and other options for treatment, the patient has consented to  Procedure(s): ESOPHAGOGASTRODUODENOSCOPY (EGD) WITH PROPOFOL (N/A) as a surgical intervention.  The patient's history has been reviewed, patient examined, no change in status, stable for surgery.  I have reviewed the patient's chart and labs.  Questions were answered to the patient's satisfaction.     [name]

## 2022-12-12 NOTE — Op Note (Signed)
[facility] Patient Name: Sean Schaefer Procedure Date : 12/12/2022 MRN: [id number] Attending MD: Willaim Rayas. Adela Lank , MD, [id number] Date of Birth: 1966-06-10 CSN: [id number] Age: 57 Admit Type: Inpatient Procedure:                Upper GI endoscopy Indications:              Chest pain (non cardiac), Nausea with vomiting. On                            pepcid Providers:                Willaim Rayas. Adela Lank, MD, Adolph Pollack, RN,                            Salley Scarlet, Technician, Ginette Otto, CRNA Referring MD:              Medicines:                Monitored Anesthesia Care Complications:            No immediate complications. Estimated blood loss:                            Minimal. Estimated Blood Loss:     Estimated blood loss was minimal. Procedure:                Pre-Anesthesia Assessment:                           - Prior to the procedure, a History and Physical                            was performed, and patient medications and                            allergies were reviewed. The patient's tolerance of                            previous anesthesia was also reviewed. The risks                            and benefits of the procedure and the sedation                            options and risks were discussed with the patient.                            All questions were answered, and informed consent                            was obtained. Prior Anticoagulants: The patient has                            taken Brilinta (ticagrelor), last dose was 2 days  prior to procedure. ASA Grade Assessment: IV - A                            patient with severe systemic disease that is a                            constant threat to life. After reviewing the risks                            and benefits, the patient was deemed in                            satisfactory condition to undergo the procedure.                           After  obtaining informed consent, the endoscope was                            passed under direct vision. Throughout the                            procedure, the patient's blood pressure, pulse, and                            oxygen saturations were monitored continuously. The                            [id number]-[id number] ([id number]) Olympus endoscope was introduced                            through the mouth, and advanced to the second part                            of duodenum. The upper GI endoscopy was                            accomplished without difficulty. The patient                            tolerated the procedure well. Scope In: Scope Out: Findings:      Esophagogastric landmarks were identified: the Z-line was found at 39       cm, the gastroesophageal junction was found at 39 cm and the upper       extent of the gastric folds was found at 40 cm from the incisors.      A 1 cm hiatal hernia was present.      LA Grade A (one or more mucosal breaks less than 5 mm, not extending       between tops of 2 mucosal folds) esophagitis was found at the GEJ.      The exam of the esophagus was otherwise normal.      The entire examined stomach was normal. Biopsies were taken with a cold       forceps for Helicobacter pylori testing.      The examined  duodenum was normal. Impression:               - Esophagogastric landmarks identified.                           - 1 cm hiatal hernia.                           - LA Grade A reflux esophagitis.                           - Normal esophagus otherwise.                           - Normal stomach. Biopsied.                           - Normal examined duodenum.                           Reflux esophagitis could be cause of chest pain.                            Patient is feeling significantly better today,                            wants to eat. Would advance diet as tolerated,                            continue Zofran. Would switch pepcid to protonix                             40mg  twice daily for a few weeks and then once                            daily thereafter (if no contraindications - I see                            he is on pepcid only) Recommendation:           - Return patient to hospital ward for ongoing care.                           - Advance diet as tolerated.                           - Continue present medications.                           - Change pepcid to protonix as outlined if no                            contraindications                           - Continue antiemetics                           -  Await pathology results.                           - GI service will sign off for now, call with                            questions moving forward. Procedure Code(s):        --- Professional ---                           [phone], Esophagogastroduodenoscopy, flexible,                            transoral; with biopsy, single or multiple Diagnosis Code(s):        --- Professional ---                           K44.9, Diaphragmatic hernia without obstruction or                            gangrene                           K21.00, Gastro-esophageal reflux disease with                            esophagitis, without bleeding                           R07.89, Other chest pain                           R11.2, Nausea with vomiting, unspecified CPT copyright 2022 [employer]. All rights reserved. The codes documented in this report are preliminary and upon coder review may  be revised to meet current compliance requirements. Viviann Spare P. Yunis Voorheis, MD 12/12/2022 11:59:32 AM This report has been signed electronically. Number of Addenda: 0

## 2022-12-12 NOTE — Plan of Care (Signed)
  Problem: Education: Goal: Ability to describe self-care measures that may prevent or decrease complications (Diabetes Survival Skills Education) will improve Outcome: Progressing Goal: Individualized Educational Video(s) Outcome: Progressing   Problem: Coping: Goal: Ability to adjust to condition or change in health will improve Outcome: Progressing   Problem: Fluid Volume: Goal: Ability to maintain a balanced intake and output will improve Outcome: Progressing   Problem: Health Behavior/Discharge Planning: Goal: Ability to identify and utilize available resources and services will improve Outcome: Progressing Goal: Ability to manage health-related needs will improve Outcome: Progressing   Problem: Metabolic: Goal: Ability to maintain appropriate glucose levels will improve Outcome: Progressing   Problem: Nutritional: Goal: Maintenance of adequate nutrition will improve Outcome: Progressing Goal: Progress toward achieving an optimal weight will improve Outcome: Progressing   Problem: Tissue Perfusion: Goal: Adequacy of tissue perfusion will improve Outcome: Progressing   Problem: Education: Goal: Knowledge of General Education information will improve Description: Including pain rating scale, medication(s)/side effects and non-pharmacologic comfort measures Outcome: Progressing   Problem: Health Behavior/Discharge Planning: Goal: Ability to manage health-related needs will improve Outcome: Progressing   Problem: Clinical Measurements: Goal: Ability to maintain clinical measurements within normal limits will improve Outcome: Progressing Goal: Will remain free from infection Outcome: Progressing Goal: Diagnostic test results will improve Outcome: Progressing Goal: Respiratory complications will improve Outcome: Progressing Goal: Cardiovascular complication will be avoided Outcome: Progressing   Problem: Activity: Goal: Risk for activity intolerance will  decrease Outcome: Progressing   Problem: Nutrition: Goal: Adequate nutrition will be maintained Outcome: Progressing   Problem: Coping: Goal: Level of anxiety will decrease Outcome: Progressing   Problem: Elimination: Goal: Will not experience complications related to bowel motility Outcome: Progressing Goal: Will not experience complications related to urinary retention Outcome: Progressing   Problem: Pain Managment: Goal: General experience of comfort will improve Outcome: Progressing   Problem: Safety: Goal: Ability to remain free from injury will improve Outcome: Progressing   Problem: Skin Integrity: Goal: Risk for impaired skin integrity will decrease Outcome: Progressing   

## 2022-12-12 NOTE — Transfer of Care (Signed)
Immediate Anesthesia Transfer of Care Note  Patient: Sean Schaefer  Procedure(s) Performed: ESOPHAGOGASTRODUODENOSCOPY (EGD) WITH PROPOFOL BIOPSY  Patient Location: PACU  Anesthesia Type:MAC  Level of Consciousness: awake, alert , and oriented  Airway & Oxygen Therapy: Patient connected to nasal cannula oxygen  Post-op Assessment: Post -op Vital signs reviewed and stable  Post vital signs: stable  Last Vitals:  Vitals Value Taken Time  BP 168/101 12/12/22 1200  Temp    Pulse 76 12/12/22 1202  Resp 19 12/12/22 1202  SpO2 100 % 12/12/22 1202  Vitals shown include unvalidated device data.  Last Pain:  Vitals:   12/12/22 1028  TempSrc:   PainSc: 0-No pain         Complications: No notable events documented.

## 2022-12-13 ENCOUNTER — Other Ambulatory Visit (HOSPITAL_COMMUNITY): Payer: Self-pay

## 2022-12-13 ENCOUNTER — Encounter (HOSPITAL_COMMUNITY): Payer: 59

## 2022-12-13 DIAGNOSIS — R112 Nausea with vomiting, unspecified: Secondary | ICD-10-CM | POA: Diagnosis not present

## 2022-12-13 LAB — GLUCOSE, CAPILLARY
Glucose-Capillary: 149 mg/dL — ABNORMAL HIGH (ref 70–99)
Glucose-Capillary: 132 mg/dL — ABNORMAL HIGH (ref 70–99)
Glucose-Capillary: 93 mg/dL (ref 70–99)

## 2022-12-13 LAB — SURGICAL PATHOLOGY

## 2022-12-13 MED ORDER — ONDANSETRON 4 MG PO TBDP
4.0000 mg | ORAL_TABLET | Freq: Three times a day (TID) | ORAL | 0 refills | Status: DC | PRN
  Filled 2022-12-13: qty 20, 7d supply, fill #0

## 2022-12-13 MED ORDER — PANTOPRAZOLE SODIUM 40 MG PO TBEC
40.0000 mg | DELAYED_RELEASE_TABLET | Freq: Two times a day (BID) | ORAL | 11 refills | Status: DC
  Filled 2022-12-13: qty 60, 30d supply, fill #0
  Filled 2023-01-07: qty 60, 30d supply, fill #1
  Filled 2023-02-09: qty 60, 30d supply, fill #2

## 2022-12-13 NOTE — Progress Notes (Signed)
   12/13/22 0400  Assess: MEWS Score  BP (!) 61/48  Assess: MEWS Score  MEWS Temp 0  MEWS Systolic 3  MEWS Pulse 1  MEWS RR 0  MEWS LOC 0  MEWS Score 4  MEWS Score Color Red  Assess: if the MEWS score is Yellow or Red  Were vital signs taken at a resting state? Yes  Focused Assessment Change from prior assessment (see assessment flowsheet)  Does the patient meet 2 or more of the SIRS criteria? No  MEWS guidelines implemented  Yes, red  Treat  MEWS Interventions Considered administering scheduled or prn medications/treatments as ordered  Take Vital Signs  Increase Vital Sign Frequency  Red: Q1hr x2, continue Q4hrs until patient remains green for 12hrs  Escalate  MEWS: Escalate Red: Discuss with charge nurse and notify provider. Consider notifying RRT. If remains red for 2 hours consider need for higher level of care

## 2022-12-13 NOTE — Plan of Care (Signed)
Patient discharge instructions reviewed and patient verbalzied understanding using teach back. Time was provided for questions or concern and verbalized all questions had been answered.

## 2022-12-13 NOTE — Progress Notes (Signed)
   12/13/22 0403  Assess: MEWS Score  BP (!) 63/47  Assess: MEWS Score  MEWS Temp 0  MEWS Systolic 3  MEWS Pulse 1  MEWS RR 0  MEWS LOC 0  MEWS Score 4  MEWS Score Color Red  Assess: if the MEWS score is Yellow or Red  Were vital signs taken at a resting state? Yes  Focused Assessment Change from prior assessment (see assessment flowsheet)  Does the patient meet 2 or more of the SIRS criteria? No  MEWS guidelines implemented  No, previously red, continue vital signs every 4 hours (on a Nitro drip, stopped Nitro)

## 2022-12-13 NOTE — Discharge Summary (Signed)
Physician Discharge Summary  Sean Schaefer WUJ:[id number] DOB: 01-22-1966 DOA: 12/09/2022  PCP: Sean Dills, MD  Admit date: 12/09/2022 Discharge date: 12/13/2022  Admitted From: Home Disposition: Home  Recommendations for Outpatient Follow-up:  Follow up with PCP in 1-2 weeks Please obtain BMP/CBC/magnesium/phosphorus in one week GI and cardiology will schedule follow-up  Discharge Condition: Stable CODE STATUS: Full code Diet recommendation: Low-salt and low-carb diet, soft diet  Discharge summary: Patient is 57 year old gentleman with recent history of non-STEMI status postcardiac cath and stenting, Crohn's disease with fistula, insulin-dependent type 2 diabetes, hypertension who presented to the emergency room with about 1 week of ongoing nausea, vomiting and chest discomfort.  He had episode of dizziness at cardiac rehab.  Chest pain started even before vomiting. In the emergency room hemodynamically stable.  Troponins negative.  CT scan abdomen pelvis notable for postoperative changes of colectomy and ileoanal pouch inflammation otherwise normal.  Admitted due to significant symptoms. With some clinical improvement underwent upper GI endoscopy 5/23, he started having recurrent symptoms nausea and midsternal chest pain after the procedure.  Improved with symptomatic management. Remained in the hospital due to persistent nausea vomiting and elevated blood pressures after nausea.  # Intractable nausea vomiting: Had some improvement of symptoms after conservative management. Underwent EGD 5/23, hypertension, nausea and midsternal chest pain after the procedure. Again treated symptomatically with nausea medications and IV fluids. Added Protonix 40 mg twice daily.  He has normal bowel function. EGD with no significant findings, 1 cm hiatal hernia and grade A reflux esophagitis. Today tolerating regular soft diet.  Symptoms improved. Patient will go home with Protonix 40 mg twice daily  until seen by GI office in follow-up. Abdominal exam is benign. Patient with normal bowel function. CT scan abdomen pelvis without any significant findings.    #  Chest pain in a patient with coronary artery disease and recent LAD stent: Symptoms exacerbated after working at cardiac rehab. Troponins have been negative.  EKG nonischemic. Patient remained on aspirin and Brilinta, statin and beta-blocker. Due to ongoing chest discomfort, seen by cardiology.   Not recommending any further ischemic evaluation. His chest pain is likely related to reflux esophagitis and esophageal spasm.   Crohn's disease with fistula, pouchitis: This is likely chronic findings and related to inflammation.  Patient is on Imuran that is continued.   Essential hypertension with fluctuating blood pressures:  Blood pressure is usually controlled on Coreg and irbesartan.  Exacerbated with episodes of nausea and chest discomfort.  No change in therapy today.   Type 2 diabetes: Recent A1c of 12.1.  Now eating regular diet.  Go back on home regimen of long-acting insulin and prandial insulin.  Stable for discharge.  Discharge Diagnoses:  Principal Problem:   Intractable nausea and vomiting Active Problems:   Crohn's disease (HCC)   Uncontrolled type 2 diabetes mellitus with hyperglycemia, with long-term current use of insulin (HCC)   Coronary artery disease involving native coronary artery of native heart with unstable angina pectoris (HCC)   Pouchitis (HCC)   Chest pain    Discharge Instructions  Discharge Instructions     Call MD for:  persistant nausea and vomiting   Complete by: As directed    Diet - low sodium heart healthy   Complete by: As directed    Soft diet   Increase activity slowly   Complete by: As directed       Allergies as of 12/13/2022       Reactions   Statins  Other (See Comments)   Myalgias  Currently taking Crestor 10mg .        Medication List     TAKE these  medications    aspirin EC 81 MG tablet Take 1 tablet (81 mg total) by mouth daily. Swallow whole.   azaTHIOprine 50 MG tablet Commonly known as: IMURAN Take 50 mg by mouth daily.   carvedilol 12.5 MG tablet Commonly known as: COREG Take 6.25 mg ( 1/2 tablet of 12.5 mg)  in the morning and 12.5 mg  in the evening   FreeStyle Libre 14 Day Sensor Misc Apply 1 sensor to the back of upper arm every 14 days   insulin lispro 100 UNIT/ML injection Commonly known as: HUMALOG Inject 8 Units into the skin 3 (three) times daily with meals.   irbesartan 150 MG tablet Commonly known as: AVAPRO Take 0.5 tablets (75 mg total) by mouth daily.   Jardiance 10 MG Tabs tablet Generic drug: empagliflozin Take 1 tablet (10 mg total) by mouth daily.   nitroGLYCERIN 0.4 MG SL tablet Commonly known as: NITROSTAT Place 1 tablet (0.4 mg total) under the tongue every 5 (five) minutes as needed for chest pain.   ondansetron 4 MG disintegrating tablet Commonly known as: ZOFRAN-ODT Take 1 tablet (4 mg total) by mouth every 8 (eight) hours as needed for nausea or vomiting.   pantoprazole 40 MG tablet Commonly known as: Protonix Take 1 tablet (40 mg total) by mouth 2 (two) times daily.   rosuvastatin 40 MG tablet Commonly known as: Crestor Take 1 tablet (40 mg total) by mouth daily.   ticagrelor 90 MG Tabs tablet Commonly known as: BRILINTA Take 1 tablet (90 mg) by mouth 2 times daily.   [name] FlexTouch 100 UNIT/ML FlexTouch Pen Generic drug: insulin degludec Inject 38 units into the skin once a day 90 days        Allergies  Allergen Reactions   Statins Other (See Comments)    Myalgias  Currently taking Crestor 10mg .    Consultations: Cardiology Gastroenterology   Procedures/Studies: CT ABDOMEN PELVIS W CONTRAST  Result Date: 12/09/2022 CLINICAL DATA:  Crohn's exacerbation. EXAM: CT ABDOMEN AND PELVIS WITH CONTRAST TECHNIQUE: Multidetector CT imaging of the abdomen and pelvis  was performed using the standard protocol following bolus administration of intravenous contrast. RADIATION DOSE REDUCTION: This exam was performed according to the departmental dose-optimization program which includes automated exposure control, adjustment of the mA and/or kV according to patient size and/or use of iterative reconstruction technique. CONTRAST:  75mL OMNIPAQUE IOHEXOL 350 MG/ML SOLN COMPARISON:  MRI pelvis 12/31/2021; MR enterography 07/14/2022; CT pelvis 04/03/2021 FINDINGS: Lower chest: No acute abnormality. Hepatobiliary: Cholelithiasis without evidence of cholecystitis. Unremarkable liver. No biliary dilation. Pancreas: Unremarkable. Spleen: Unremarkable. Adrenals/Urinary Tract: Normal adrenal glands. No urinary calculi or hydronephrosis. Unremarkable bladder. 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 Small stool ball in the ileoanal pouch with mild wall thickening an adjacent stranding. No evidence of obstruction. No additional bowel wall thickening. Stomach is within normal limits. Vascular/Lymphatic: Subcentimeter perirectal lymph nodes are likely reactive. No acute vascular abnormality. Reproductive: Enlarged prostate. Other: No free intraperitoneal air. Partially visualized setons within the left perianal region where there is soft tissue thickening in the region of the previously seen abscess in perianal fistula. Musculoskeletal: No acute osseous abnormality. IMPRESSION: 1. 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 2. Partially visualized perianal fistula.  No visible abscess. 3. Cholelithiasis without evidence of cholecystitis. Electronically Signed  By: Minerva Fester M.D.   On: 12/09/2022 22:55   DG Chest 2 View  Result Date: 12/09/2022 CLINICAL DATA:  Chest pain.  Three EXAM: CHEST - 2 VIEW COMPARISON:  3024 FINDINGS: The heart size and mediastinal contours are within normal  limits. Both lungs are clear. The visualized skeletal structures are unremarkable. IMPRESSION: No active cardiopulmonary disease. Electronically Signed   By: Norva Pavlov M.D.   On: 12/09/2022 18:20   (Echo, Carotid, EGD, Colonoscopy, ERCP)    Subjective: Patient seen in the morning rounds.  Overnight hypertensive on nitro drip that was discontinued.  Blood pressures are well-controlled now.  Patient was kept in the hospital to monitor for diet tolerance and he did well.  Took soft diet without obvious nausea. Go home with Zofran as needed.   Discharge Exam: Vitals:   12/13/22 1000 12/13/22 1124  BP: 133/73 (!) 144/68  Pulse: 95 86  Resp:  20  Temp:  98.2 F (36.8 C)  SpO2: 99% 98%   Vitals:   12/13/22 0600 12/13/22 0833 12/13/22 1000 12/13/22 1124  BP: 105/72 (!) 149/81 133/73 (!) 144/68  Pulse: 92 93 95 86  Resp: 18   20  Temp:    98.2 F (36.8 C)  TempSrc:    Oral  SpO2:  99% 99% 98%  Weight:      Height:        General: Pt is alert, awake, not in acute distress Cardiovascular: RRR, S1/S2 +, no rubs, no gallops Respiratory: CTA bilaterally, no wheezing, no rhonchi Abdominal: Soft, NT, ND, bowel sounds +, surgical scars healthy and nontender. Extremities: no edema, no cyanosis    The results of significant diagnostics from this hospitalization (including imaging, microbiology, ancillary and laboratory) are listed below for reference.     Microbiology: No results found for this or any previous visit (from the past 240 hour(s)).   Labs: BNP (last 3 results) No results for input(s): "BNP" in the last 8760 hours. Basic Metabolic Panel: Recent Labs  Lab 12/09/22 1730 12/10/22 0131 12/11/22 0053  NA 135 139 135  K 3.9 4.2 4.4  CL 99 106 101  CO2 22 19* 20*  GLUCOSE 183* 88 143*  BUN 19 17 21*  CREATININE 1.17 1.03 1.33*  CALCIUM 9.3 8.8* 8.3*  MG  --  2.0  --    Liver Function Tests: Recent Labs  Lab 12/09/22 1858 12/10/22 0131  AST 30 21  ALT 23  20  ALKPHOS 48 47  BILITOT 1.4* 1.4*  PROT 7.7 7.5  ALBUMIN 3.4* 3.3*   Recent Labs  Lab 12/09/22 1858  LIPASE 40   No results for input(s): "AMMONIA" in the last 168 hours. CBC: Recent Labs  Lab 12/09/22 1730 12/10/22 0131 12/11/22 0053 12/12/22 0051  WBC 5.2 5.7 7.1 5.3  HGB 13.4 12.7* 12.2* 12.5*  HCT 39.9 37.9* 36.3* 36.4*  MCV 96.6 98.2 97.1 96.8  PLT 172 154 149* 139*   Cardiac Enzymes: No results for input(s): "CKTOTAL", "CKMB", "CKMBINDEX", "TROPONINI" in the last 168 hours. BNP: Invalid input(s): "POCBNP" CBG: Recent Labs  Lab 12/12/22 1947 12/12/22 2348 12/13/22 0318 12/13/22 0734 12/13/22 1122  GLUCAP 125* 125* 149* 132* 93   D-Dimer No results for input(s): "DDIMER" in the last 72 hours. Hgb A1c No results for input(s): "HGBA1C" in the last 72 hours. Lipid Profile No results for input(s): "CHOL", "HDL", "LDLCALC", "TRIG", "CHOLHDL", "LDLDIRECT" in the last 72 hours. Thyroid function studies No results for input(s): "TSH", "T4TOTAL", "  T3FREE", "THYROIDAB" in the last 72 hours.  Invalid input(s): "FREET3" Anemia work up No results for input(s): "VITAMINB12", "FOLATE", "FERRITIN", "TIBC", "IRON", "RETICCTPCT" in the last 72 hours. Urinalysis    Component Value Date/Time   COLORURINE YELLOW 10/19/2022 1955   APPEARANCEUR CLEAR 10/19/2022 1955   LABSPEC 1.026 10/19/2022 1955   PHURINE 5.0 10/19/2022 1955   GLUCOSEU >=500 (A) 10/19/2022 1955   HGBUR SMALL (A) 10/19/2022 1955   BILIRUBINUR NEGATIVE 10/19/2022 1955   KETONESUR 5 (A) 10/19/2022 1955   PROTEINUR 100 (A) 10/19/2022 1955   NITRITE NEGATIVE 10/19/2022 1955   LEUKOCYTESUR NEGATIVE 10/19/2022 1955   Sepsis Labs Recent Labs  Lab 12/09/22 1730 12/10/22 0131 12/11/22 0053 12/12/22 0051  WBC 5.2 5.7 7.1 5.3   Microbiology No results found for this or any previous visit (from the past 240 hour(s)).   Time coordinating discharge: 32 minutes  SIGNED:   Dorcas Carrow,  MD  [facility] 12/13/2022, 3:07 PM

## 2022-12-13 NOTE — Progress Notes (Signed)
   12/13/22 0410  Assess: MEWS Score  BP (!) 75/61  Pulse Rate (!) 101  Assess: MEWS Score  MEWS Temp 0  MEWS Systolic 2  MEWS Pulse 1  MEWS RR 0  MEWS LOC 0  MEWS Score 3  MEWS Score Color Yellow  Assess: if the MEWS score is Yellow or Red  Were vital signs taken at a resting state? Yes  Focused Assessment Change from prior assessment (see assessment flowsheet)  Does the patient meet 2 or more of the SIRS criteria? No  MEWS guidelines implemented  Yes, yellow  Treat  MEWS Interventions Considered administering scheduled or prn medications/treatments as ordered  Take Vital Signs  Increase Vital Sign Frequency  Yellow: Q2hr x1, continue Q4hrs until patient remains green for 12hrs  Escalate  MEWS: Escalate Yellow: Discuss with charge nurse and consider notifying provider and/or RRT  Assess: SIRS CRITERIA  SIRS Temperature  0  SIRS Pulse 1  SIRS Respirations  0  SIRS WBC 0  SIRS Score Sum  1

## 2022-12-13 NOTE — Progress Notes (Signed)
   12/13/22 0419  Assess: MEWS Score  BP (!) 77/50  Pulse Rate (!) 106  Assess: MEWS Score  MEWS Temp 0  MEWS Systolic 2  MEWS Pulse 1  MEWS RR 0  MEWS LOC 0  MEWS Score 3  MEWS Score Color Yellow  Assess: if the MEWS score is Yellow or Red  Were vital signs taken at a resting state? Yes  Focused Assessment No change from prior assessment  Does the patient meet 2 or more of the SIRS criteria? No  MEWS guidelines implemented  No, previously yellow, continue vital signs every 4 hours  Assess: SIRS CRITERIA  SIRS Temperature  0  SIRS Pulse 1  SIRS Respirations  0  SIRS WBC 0  SIRS Score Sum  1

## 2022-12-13 NOTE — Plan of Care (Signed)
  Problem: Elimination: Goal: Will not experience complications related to urinary retention Outcome: Completed/Met   Problem: Skin Integrity: Goal: Risk for impaired skin integrity will decrease Outcome: Completed/Met   

## 2022-12-13 NOTE — TOC Transition Note (Addendum)
Transition of Care [facility]) - CM/SW Discharge Note   Patient Details  Name: Sean Schaefer MRN: [id number] Date of Birth: [birth year]  [facility] CM/SW Contact:  Leone Haven, RN Phone Number: 12/13/2022, 3:22 PM   Clinical Narrative:    Patient is for dc today, wife will transport home, he is indep.  He has no needs. Patient will call to make his follow up apt,          Patient Goals and CMS Choice      Discharge Placement                         Discharge Plan and Services Additional resources added to the After Visit Summary for                                       Social Determinants of Health (SDOH) Interventions SDOH Screenings   Food Insecurity: No Food Insecurity (12/10/2022)  Housing: Low Risk  (12/10/2022)  Transportation Needs: No Transportation Needs (12/10/2022)  Utilities: Not At Risk (12/10/2022)  Depression (PHQ2-9): Low Risk  (11/14/2022)  Tobacco Use: Low Risk  (12/12/2022)     Readmission Risk Interventions    12/11/2022    4:59 PM  Readmission Risk Prevention Plan  Transportation Screening Complete  PCP or Specialist Appt within 5-7 Days Complete  Home Care Screening Complete  Medication Review (RN CM) Complete

## 2022-12-13 NOTE — Progress Notes (Signed)
   12/13/22 0506  Assess: MEWS Score  Temp 97.9 F (36.6 C)  BP 95/65  Pulse Rate 96  Resp 18  Assess: MEWS Score  MEWS Temp 0  MEWS Systolic 1  MEWS Pulse 0  MEWS RR 0  MEWS LOC 0  MEWS Score 1  MEWS Score Color Green  Assess: if the MEWS score is Yellow or Red  Were vital signs taken at a resting state? Yes  Focused Assessment Change from prior assessment (see assessment flowsheet)  Does the patient meet 2 or more of the SIRS criteria? No  MEWS guidelines implemented  No, other (Comment) (BP is back to WNL,)  Assess: SIRS CRITERIA  SIRS Temperature  0  SIRS Pulse 1  SIRS Respirations  0  SIRS WBC 0  SIRS Score Sum  1

## 2022-12-13 NOTE — Progress Notes (Signed)
   12/13/22 0500  Assess: MEWS Score  BP (!) 74/53  Pulse Rate 99  Assess: MEWS Score  MEWS Temp 0  MEWS Systolic 2  MEWS Pulse 0  MEWS RR 0  MEWS LOC 0  MEWS Score 2  MEWS Score Color Yellow  Assess: if the MEWS score is Yellow or Red  Were vital signs taken at a resting state? Yes  Focused Assessment No change from prior assessment  Does the patient meet 2 or more of the SIRS criteria? No  MEWS guidelines implemented  No, previously yellow, continue vital signs every 4 hours  Assess: SIRS CRITERIA  SIRS Temperature  0  SIRS Pulse 1  SIRS Respirations  0  SIRS WBC 0  SIRS Score Sum  1

## 2022-12-14 ENCOUNTER — Other Ambulatory Visit (HOSPITAL_COMMUNITY): Payer: Self-pay

## 2022-12-14 ENCOUNTER — Telehealth: Payer: Self-pay | Admitting: Physician Assistant

## 2022-12-14 NOTE — Telephone Encounter (Signed)
12/14/2022 10:05 AM Telephone Call  Patient called on-call service.  Her husband was discharged from the hospital yesterday, did have an EGD while here with gastritis.  Apparently started feeling nauseous at discharge and ended up taking 2 Zofran, but woke up around 3:00 in the morning with vomiting and just "in a bad way".  They have not yet started the Pantoprazole as prescribed.  Patient's wife is calling to see what else they can do.  Apparently she went to the pharmacy [facility] and they talked about Carafate.  At this point recommend that they start the Pantoprazole taking this twice daily, discussed how this can help heal his gastritis.  Would also recommend to go ahead and schedule Zofran one 4 mg tab every 6 hours.  Discussed that if he is still nauseous after taking 1 he could take another tablet within that timeframe.  His wife verbalized understanding.  They can try over-the-counter Maalox/Mylanta on top of his other medicines to act as a coating.  Would wait on the Carafate for now as this can hinder absorption of his other medicines and may be difficult to take in conjunction with all that he is on.  Asked her to call us back if they have further problems.  Hyacinth Meeker, PA-C

## 2022-12-15 ENCOUNTER — Encounter (HOSPITAL_COMMUNITY): Payer: Self-pay | Admitting: Gastroenterology

## 2022-12-17 ENCOUNTER — Telehealth (HOSPITAL_COMMUNITY): Payer: Self-pay | Admitting: *Deleted

## 2022-12-17 ENCOUNTER — Other Ambulatory Visit (HOSPITAL_COMMUNITY): Payer: Self-pay

## 2022-12-17 NOTE — Telephone Encounter (Signed)
Spoke with [employer] will hold exercise until cleared by MD to return to exercise. Patient states understanding.Thayer Headings RN BSN

## 2022-12-17 NOTE — Telephone Encounter (Signed)
Thank you for the update.  After reviewing the discharge summary of this hospitalization, I agree with those recommendations, including avoidance of Carafate at this point.  He has an appointment with me later this week.  HD

## 2022-12-17 NOTE — Telephone Encounter (Signed)
Thanks for the note Victorino Dike. Saunders Glance the patient was extremely hypertensive to over 200s systolic while admitted, not sure how much of that was playing a role in some of his symptoms as well at the time. Hopefully that has improved

## 2022-12-18 ENCOUNTER — Encounter (HOSPITAL_COMMUNITY): Payer: 59

## 2022-12-20 ENCOUNTER — Other Ambulatory Visit (INDEPENDENT_AMBULATORY_CARE_PROVIDER_SITE_OTHER): Payer: 59

## 2022-12-20 ENCOUNTER — Emergency Department (HOSPITAL_COMMUNITY): Payer: 59

## 2022-12-20 ENCOUNTER — Other Ambulatory Visit (HOSPITAL_COMMUNITY): Payer: Self-pay

## 2022-12-20 ENCOUNTER — Inpatient Hospital Stay (HOSPITAL_COMMUNITY)
Admission: EM | Admit: 2022-12-20 | Discharge: 2022-12-24 | DRG: 348 | Disposition: A | Discharge status: Home / Self Care | Payer: 59 | Attending: Internal Medicine | Admitting: Internal Medicine

## 2022-12-20 ENCOUNTER — Other Ambulatory Visit: Payer: Self-pay | Admitting: Student

## 2022-12-20 ENCOUNTER — Ambulatory Visit: Payer: 59 | Admitting: Gastroenterology

## 2022-12-20 ENCOUNTER — Encounter: Payer: Self-pay | Admitting: Gastroenterology

## 2022-12-20 ENCOUNTER — Encounter (HOSPITAL_COMMUNITY): Payer: Self-pay

## 2022-12-20 VITALS — BP 132/80 | HR 114 | Ht 70.0 in | Wt 194.0 lb

## 2022-12-20 DIAGNOSIS — E876 Hypokalemia: Secondary | ICD-10-CM | POA: Diagnosis present

## 2022-12-20 DIAGNOSIS — Z7960 Long term (current) use of unspecified immunomodulators and immunosuppressants: Secondary | ICD-10-CM

## 2022-12-20 DIAGNOSIS — K6289 Other specified diseases of anus and rectum: Secondary | ICD-10-CM

## 2022-12-20 DIAGNOSIS — E663 Overweight: Secondary | ICD-10-CM | POA: Diagnosis present

## 2022-12-20 DIAGNOSIS — Z955 Presence of coronary angioplasty implant and graft: Secondary | ICD-10-CM

## 2022-12-20 DIAGNOSIS — K610 Anal abscess: Secondary | ICD-10-CM | POA: Diagnosis not present

## 2022-12-20 DIAGNOSIS — I2510 Atherosclerotic heart disease of native coronary artery without angina pectoris: Secondary | ICD-10-CM | POA: Diagnosis not present

## 2022-12-20 DIAGNOSIS — D696 Thrombocytopenia, unspecified: Secondary | ICD-10-CM | POA: Diagnosis not present

## 2022-12-20 DIAGNOSIS — K612 Anorectal abscess: Secondary | ICD-10-CM | POA: Diagnosis present

## 2022-12-20 DIAGNOSIS — N401 Enlarged prostate with lower urinary tract symptoms: Secondary | ICD-10-CM | POA: Diagnosis present

## 2022-12-20 DIAGNOSIS — Z6828 Body mass index (BMI) 28.0-28.9, adult: Secondary | ICD-10-CM

## 2022-12-20 DIAGNOSIS — I252 Old myocardial infarction: Secondary | ICD-10-CM

## 2022-12-20 DIAGNOSIS — E1165 Type 2 diabetes mellitus with hyperglycemia: Secondary | ICD-10-CM

## 2022-12-20 DIAGNOSIS — E785 Hyperlipidemia, unspecified: Secondary | ICD-10-CM | POA: Diagnosis present

## 2022-12-20 DIAGNOSIS — Z888 Allergy status to other drugs, medicaments and biological substances status: Secondary | ICD-10-CM

## 2022-12-20 DIAGNOSIS — F5104 Psychophysiologic insomnia: Secondary | ICD-10-CM | POA: Diagnosis present

## 2022-12-20 DIAGNOSIS — Z79620 Long term (current) use of immunosuppressive biologic: Secondary | ICD-10-CM

## 2022-12-20 DIAGNOSIS — R112 Nausea with vomiting, unspecified: Secondary | ICD-10-CM | POA: Diagnosis not present

## 2022-12-20 DIAGNOSIS — K50114 Crohn's disease of large intestine with abscess: Principal | ICD-10-CM | POA: Diagnosis present

## 2022-12-20 DIAGNOSIS — K611 Rectal abscess: Secondary | ICD-10-CM

## 2022-12-20 DIAGNOSIS — N179 Acute kidney failure, unspecified: Secondary | ICD-10-CM | POA: Diagnosis present

## 2022-12-20 DIAGNOSIS — Z7984 Long term (current) use of oral hypoglycemic drugs: Secondary | ICD-10-CM

## 2022-12-20 DIAGNOSIS — Z7982 Long term (current) use of aspirin: Secondary | ICD-10-CM

## 2022-12-20 DIAGNOSIS — D649 Anemia, unspecified: Secondary | ICD-10-CM | POA: Diagnosis present

## 2022-12-20 DIAGNOSIS — I10 Essential (primary) hypertension: Secondary | ICD-10-CM | POA: Diagnosis not present

## 2022-12-20 DIAGNOSIS — K8020 Calculus of gallbladder without cholecystitis without obstruction: Secondary | ICD-10-CM | POA: Diagnosis not present

## 2022-12-20 DIAGNOSIS — Z9049 Acquired absence of other specified parts of digestive tract: Secondary | ICD-10-CM

## 2022-12-20 DIAGNOSIS — K50113 Crohn's disease of large intestine with fistula: Secondary | ICD-10-CM | POA: Diagnosis present

## 2022-12-20 DIAGNOSIS — K5000 Crohn's disease of small intestine without complications: Secondary | ICD-10-CM | POA: Diagnosis not present

## 2022-12-20 DIAGNOSIS — Z794 Long term (current) use of insulin: Secondary | ICD-10-CM | POA: Diagnosis not present

## 2022-12-20 DIAGNOSIS — Z809 Family history of malignant neoplasm, unspecified: Secondary | ICD-10-CM | POA: Diagnosis not present

## 2022-12-20 DIAGNOSIS — Z7902 Long term (current) use of antithrombotics/antiplatelets: Secondary | ICD-10-CM

## 2022-12-20 DIAGNOSIS — R338 Other retention of urine: Secondary | ICD-10-CM | POA: Diagnosis present

## 2022-12-20 DIAGNOSIS — Z79899 Other long term (current) drug therapy: Secondary | ICD-10-CM

## 2022-12-20 DIAGNOSIS — Z833 Family history of diabetes mellitus: Secondary | ICD-10-CM | POA: Diagnosis not present

## 2022-12-20 DIAGNOSIS — R739 Hyperglycemia, unspecified: Secondary | ICD-10-CM

## 2022-12-20 DIAGNOSIS — Z8249 Family history of ischemic heart disease and other diseases of the circulatory system: Secondary | ICD-10-CM

## 2022-12-20 DIAGNOSIS — K5090 Crohn's disease, unspecified, without complications: Secondary | ICD-10-CM | POA: Diagnosis present

## 2022-12-20 DIAGNOSIS — I119 Hypertensive heart disease without heart failure: Secondary | ICD-10-CM | POA: Diagnosis not present

## 2022-12-20 DIAGNOSIS — E119 Type 2 diabetes mellitus without complications: Secondary | ICD-10-CM

## 2022-12-20 LAB — CBC WITH DIFFERENTIAL/PLATELET
Abs Immature Granulocytes: 0.04 10*3/uL (ref 0.00–0.07)
Basophils Absolute: 0 10*3/uL (ref 0.0–0.1)
Basophils Absolute: 0 10*3/uL (ref 0.0–0.1)
Basophils Relative: 0.2 % (ref 0.0–3.0)
Basophils Relative: 0 %
Eosinophils Absolute: 0.1 10*3/uL (ref 0.0–0.7)
Eosinophils Absolute: 0.1 10*3/uL (ref 0.0–0.5)
Eosinophils Relative: 0.8 % (ref 0.0–5.0)
Eosinophils Relative: 1 %
HCT: 34.1 % — ABNORMAL LOW (ref 39.0–52.0)
HCT: 33.8 % — ABNORMAL LOW (ref 39.0–52.0)
Hemoglobin: 11.5 g/dL — ABNORMAL LOW (ref 13.0–17.0)
Hemoglobin: 11.7 g/dL — ABNORMAL LOW (ref 13.0–17.0)
Immature Granulocytes: 1 %
Lymphocytes Relative: 4.8 % — ABNORMAL LOW (ref 12.0–46.0)
Lymphocytes Relative: 7 %
Lymphs Abs: 0.5 10*3/uL — ABNORMAL LOW (ref 0.7–4.0)
Lymphs Abs: 0.6 10*3/uL — ABNORMAL LOW (ref 0.7–4.0)
MCH: 34.3 pg — ABNORMAL HIGH (ref 26.0–34.0)
MCHC: 33.8 g/dL (ref 30.0–36.0)
MCHC: 34.6 g/dL (ref 30.0–36.0)
MCV: 99.9 fl (ref 78.0–100.0)
MCV: 99.1 fL (ref 80.0–100.0)
Monocytes Absolute: 0.9 10*3/uL (ref 0.1–1.0)
Monocytes Absolute: 1 10*3/uL (ref 0.1–1.0)
Monocytes Relative: 9.4 % (ref 3.0–12.0)
Monocytes Relative: 12 %
Neutro Abs: 8.3 10*3/uL — ABNORMAL HIGH (ref 1.4–7.7)
Neutro Abs: 6.6 10*3/uL (ref 1.7–7.7)
Neutrophils Relative %: 84.8 % — ABNORMAL HIGH (ref 43.0–77.0)
Neutrophils Relative %: 79 %
Platelets: 128 10*3/uL — ABNORMAL LOW (ref 150.0–400.0)
Platelets: 143 10*3/uL — ABNORMAL LOW (ref 150–400)
RBC: 3.42 Mil/uL — ABNORMAL LOW (ref 4.22–5.81)
RBC: 3.41 MIL/uL — ABNORMAL LOW (ref 4.22–5.81)
RDW: 15.2 % (ref 11.5–15.5)
RDW: 14.6 % (ref 11.5–15.5)
WBC: 9.8 10*3/uL (ref 4.0–10.5)
WBC: 8.4 10*3/uL (ref 4.0–10.5)
nRBC: 0 % (ref 0.0–0.2)

## 2022-12-20 LAB — COMPREHENSIVE METABOLIC PANEL
ALT: 9 U/L (ref 0–53)
ALT: 14 U/L (ref 0–44)
AST: 11 U/L (ref 0–37)
AST: 15 U/L (ref 15–41)
Albumin: 3.6 g/dL (ref 3.5–5.2)
Albumin: 3.3 g/dL — ABNORMAL LOW (ref 3.5–5.0)
Alkaline Phosphatase: 48 U/L (ref 39–117)
Alkaline Phosphatase: 51 U/L (ref 38–126)
Anion gap: 11 (ref 5–15)
BUN: 14 mg/dL (ref 6–23)
BUN: 13 mg/dL (ref 6–20)
CO2: 22 mEq/L (ref 19–32)
CO2: 23 mmol/L (ref 22–32)
Calcium: 9.1 mg/dL (ref 8.4–10.5)
Calcium: 8.9 mg/dL (ref 8.9–10.3)
Chloride: 97 mEq/L (ref 96–112)
Chloride: 97 mmol/L — ABNORMAL LOW (ref 98–111)
Creatinine, Ser: 1.47 mg/dL (ref 0.40–1.50)
Creatinine, Ser: 1.75 mg/dL — ABNORMAL HIGH (ref 0.61–1.24)
GFR, Estimated: 46 mL/min — ABNORMAL LOW (ref >60)
GFR: 53.59 mL/min — ABNORMAL LOW (ref >60.00)
Glucose, Bld: 236 mg/dL — ABNORMAL HIGH (ref 70–99)
Glucose, Bld: 302 mg/dL — ABNORMAL HIGH (ref 70–99)
Potassium: 4 mEq/L (ref 3.5–5.1)
Potassium: 3.9 mmol/L (ref 3.5–5.1)
Sodium: 133 mEq/L — ABNORMAL LOW (ref 135–145)
Sodium: 131 mmol/L — ABNORMAL LOW (ref 135–145)
Total Bilirubin: 1.7 mg/dL — ABNORMAL HIGH (ref 0.2–1.2)
Total Bilirubin: 1.7 mg/dL — ABNORMAL HIGH (ref 0.3–1.2)
Total Protein: 7.9 g/dL (ref 6.0–8.3)
Total Protein: 7.8 g/dL (ref 6.5–8.1)

## 2022-12-20 LAB — PROTIME-INR
INR: 1.1 (ref 0.8–1.2)
Prothrombin Time: 14 seconds (ref 11.4–15.2)

## 2022-12-20 LAB — GLUCOSE, CAPILLARY: Glucose-Capillary: 218 mg/dL — ABNORMAL HIGH (ref 70–99)

## 2022-12-20 MED ORDER — METRONIDAZOLE 500 MG PO TABS
500.0000 mg | ORAL_TABLET | Freq: Two times a day (BID) | ORAL | 0 refills | Status: DC
  Filled 2022-12-20: qty 42, 21d supply, fill #0

## 2022-12-20 MED ORDER — METRONIDAZOLE 500 MG/100ML IV SOLN: 500.0000 mg | Freq: Two times a day (BID) | INTRAVENOUS | Status: DC

## 2022-12-20 MED ORDER — OXYCODONE-ACETAMINOPHEN 5-325 MG PO TABS
1.0000 | ORAL_TABLET | Freq: Four times a day (QID) | ORAL | 0 refills | Status: AC | PRN
  Filled 2022-12-20: qty 45, 6d supply, fill #0

## 2022-12-20 MED ORDER — CIPROFLOXACIN HCL 500 MG PO TABS
500.0000 mg | ORAL_TABLET | Freq: Two times a day (BID) | ORAL | 0 refills | Status: DC
  Filled 2022-12-20: qty 28, 14d supply, fill #0

## 2022-12-20 MED ORDER — NITROGLYCERIN 0.4 MG SL SUBL: 0.4000 mg | SUBLINGUAL_TABLET | SUBLINGUAL | Status: DC | PRN

## 2022-12-20 MED ORDER — SODIUM CHLORIDE 0.9 % IV SOLN: INTRAVENOUS | Status: DC

## 2022-12-20 MED ORDER — PIPERACILLIN-TAZOBACTAM 3.375 G IVPB
3.3750 g | Freq: Three times a day (TID) | INTRAVENOUS | Status: DC
  Administered 2022-12-21 – 2022-12-24 (×10): 3.375 g via INTRAVENOUS
  Filled 2022-12-20 (×10): qty 50

## 2022-12-20 MED ORDER — INSULIN ASPART 100 UNIT/ML IJ SOLN
0.0000 [IU] | Freq: Three times a day (TID) | INTRAMUSCULAR | Status: DC
  Administered 2022-12-21: 1 [IU] via SUBCUTANEOUS
  Administered 2022-12-22: 2 [IU] via SUBCUTANEOUS
  Administered 2022-12-22: 1 [IU] via SUBCUTANEOUS
  Administered 2022-12-22: 2 [IU] via SUBCUTANEOUS
  Administered 2022-12-23 – 2022-12-24 (×3): 1 [IU] via SUBCUTANEOUS

## 2022-12-20 MED ORDER — IOHEXOL 350 MG/ML SOLN
70.0000 mL | Freq: Once | INTRAVENOUS | Status: AC | PRN
  Administered 2022-12-20: 70 mL via INTRAVENOUS

## 2022-12-20 MED ORDER — INSULIN ASPART 100 UNIT/ML IJ SOLN
8.0000 [IU] | Freq: Three times a day (TID) | INTRAMUSCULAR | Status: DC
  Administered 2022-12-22 – 2022-12-24 (×4): 8 [IU] via SUBCUTANEOUS

## 2022-12-20 MED ORDER — ONDANSETRON HCL 4 MG/2ML IJ SOLN: 4.0000 mg | Freq: Four times a day (QID) | INTRAMUSCULAR | Status: DC | PRN

## 2022-12-20 MED ORDER — ACETAMINOPHEN 325 MG PO TABS: 650.0000 mg | ORAL_TABLET | Freq: Four times a day (QID) | ORAL | Status: DC | PRN

## 2022-12-20 MED ORDER — PANTOPRAZOLE SODIUM 40 MG PO TBEC
40.0000 mg | DELAYED_RELEASE_TABLET | Freq: Two times a day (BID) | ORAL | Status: DC
  Administered 2022-12-20 – 2022-12-24 (×7): 40 mg via ORAL
  Filled 2022-12-20 (×7): qty 1

## 2022-12-20 MED ORDER — INSULIN ASPART 100 UNIT/ML IJ SOLN
0.0000 [IU] | Freq: Every day | INTRAMUSCULAR | Status: DC
  Administered 2022-12-20: 2 [IU] via SUBCUTANEOUS
  Administered 2022-12-21: 3 [IU] via SUBCUTANEOUS

## 2022-12-20 MED ORDER — FENTANYL CITRATE PF 50 MCG/ML IJ SOSY
75.0000 ug | PREFILLED_SYRINGE | INTRAMUSCULAR | Status: DC | PRN
  Administered 2022-12-20: 75 ug via INTRAVENOUS
  Filled 2022-12-20: qty 2

## 2022-12-20 MED ORDER — OXYCODONE-ACETAMINOPHEN 5-325 MG PO TABS
1.0000 | ORAL_TABLET | ORAL | Status: DC | PRN
  Administered 2022-12-20 – 2022-12-24 (×7): 1 via ORAL
  Filled 2022-12-20 (×7): qty 1

## 2022-12-20 MED ORDER — TICAGRELOR 90 MG PO TABS
90.0000 mg | ORAL_TABLET | Freq: Two times a day (BID) | ORAL | Status: DC
  Administered 2022-12-20 – 2022-12-24 (×7): 90 mg via ORAL
  Filled 2022-12-20 (×9): qty 1

## 2022-12-20 MED ORDER — ASPIRIN 81 MG PO TBEC
81.0000 mg | DELAYED_RELEASE_TABLET | Freq: Every day | ORAL | Status: DC
  Administered 2022-12-22 – 2022-12-24 (×3): 81 mg via ORAL
  Filled 2022-12-20 (×3): qty 1

## 2022-12-20 MED ORDER — ONDANSETRON HCL 4 MG PO TABS: 4.0000 mg | ORAL_TABLET | Freq: Four times a day (QID) | ORAL | Status: DC | PRN

## 2022-12-20 MED ORDER — ENOXAPARIN SODIUM 40 MG/0.4ML IJ SOSY
40.0000 mg | PREFILLED_SYRINGE | INTRAMUSCULAR | Status: DC
  Administered 2022-12-22 – 2022-12-24 (×3): 40 mg via SUBCUTANEOUS
  Filled 2022-12-20 (×3): qty 0.4

## 2022-12-20 MED ORDER — INSULIN GLARGINE 100 UNIT/ML ~~LOC~~ SOLN
20.0000 [IU] | Freq: Every day | SUBCUTANEOUS | Status: DC
  Administered 2022-12-21: 20 [IU] via SUBCUTANEOUS
  Filled 2022-12-20: qty 0.2

## 2022-12-20 MED ORDER — ROSUVASTATIN CALCIUM 20 MG PO TABS
40.0000 mg | ORAL_TABLET | Freq: Every day | ORAL | Status: DC
  Administered 2022-12-22 – 2022-12-24 (×3): 40 mg via ORAL
  Filled 2022-12-20 (×3): qty 2

## 2022-12-20 MED ORDER — ACETAMINOPHEN 650 MG RE SUPP: 650.0000 mg | Freq: Four times a day (QID) | RECTAL | Status: DC | PRN

## 2022-12-20 MED ORDER — CARVEDILOL 6.25 MG PO TABS
6.2500 mg | ORAL_TABLET | Freq: Two times a day (BID) | ORAL | Status: DC
  Administered 2022-12-21 – 2022-12-24 (×7): 6.25 mg via ORAL
  Filled 2022-12-20 (×7): qty 1

## 2022-12-20 MED ORDER — PIPERACILLIN-TAZOBACTAM 3.375 G IVPB 30 MIN
3.3750 g | Freq: Once | INTRAVENOUS | Status: AC
  Administered 2022-12-20: 3.375 g via INTRAVENOUS
  Filled 2022-12-20: qty 50

## 2022-12-20 MED ORDER — SODIUM CHLORIDE 0.9 % IV BOLUS
1000.0000 mL | Freq: Once | INTRAVENOUS | Status: AC
  Administered 2022-12-20: 1000 mL via INTRAVENOUS

## 2022-12-20 MED ORDER — TICAGRELOR 90 MG PO TABS: 90.0000 mg | ORAL_TABLET | Freq: Two times a day (BID) | ORAL | Status: DC

## 2022-12-20 NOTE — H&P (Signed)
REFERRING PROVIDER:  Hassell Done,*  PROVIDER:  Reine Just, PA  MRN: [id number] DOB: 08/31/1965 DATE OF ENCOUNTER: 12/20/2022  Subjective   Chief Complaint: Follow-up   History of Present Illness: VEDAD WANNINGER is a 57 y.o. male with HTN, previously carried the diagnosis of ulcerative colitis and underwent a total abdominal colectomy with ileostomy here in [place] with Dr. Mosetta Anis. He was subsequently referred to the [facility] in [place], [place] and under the care of Dr. Lynne Logan underwent creation of an ileal J-pouch. Occurred ~1989. In the years after surgery he has been dealing with recurrent perianal abscesses. He has had multiple incision and drainage procedures. He has also had multiple abscesses at home and he has been able to self express.   He has a recent history of NSTEMI, status post cardiac cath and stenting in March 2024 on Brilinta and Aspirin.  He has been taking Remicade for Crohn's disease but states it has not been effective.    He is referred to urgent office today by Dr. Myrtie Neither, [facility], who evaluated him earlier this morning, 12/20/22, for a recurrent left-sided perianal abscess.  He developed left buttock pain 3 days ago.  It has worsened since then and he is unable to sit down or sleep comfortably.  He denies fever and drainage.  He states he still has 2 setons in place. He states the Penrose drain placed by Dr. Bedelia Person emergently in Sept 2022, was removed/fell out about 5 to 6 months ago.  He last saw Dr. Cliffton Asters in January 2023.    OR 02/05/21  1. I&D perianal abscess 2. Placement of draining setons x2 3. Pouchoscopy with biopsies   Pathology returned: A. J POUCH, BIOPSY:  -  Superficial colonic mucosa with surface erosions, acute and chronic  inflammation  -  No granulomas or malignancy identified   B. ANAL CANAL, BIOPSY:  -  Superficial colonic mucosa with surface erosions, acute and chronic  inflammation  -  No granulomas  or malignancy identified    He is following with [facility], Dr. Myrtie Neither.  Completed initiation of Remicide and is now on maintenance.   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 Janean Sark)   FHx: Denies any known family history of colorectal, breast, endometrial or ovarian cancer   Social Hx: Denies use of tobacco/EtOH/illicit drug. Works multiple jobs including jobs in Engineer, manufacturing. He was seen in the office with his wife whom works with [employer] in behavioral health   Review of Systems: A complete review of systems was obtained from the patient.  I have reviewed this information and discussed as appropriate with the patient.  See HPI as well for other ROS.  ROS  All other review of systems negative.   Medical History: Past Medical History:  Diagnosis Date   Diabetes mellitus without complication (CMS/HHS-HCC)     Patient Active Problem List  Diagnosis   Anal fistula   Crohn's disease (CMS/HHS-HCC)   Diabetes (CMS/HHS-HCC)   Gluteal abscess   History of ulcerative colitis   Latent tuberculosis by blood test   Patellar malalignment syndrome, left   Rectal abscess   Uncontrolled type 2 diabetes mellitus with hyperglycemia (CMS/HHS-HCC)    Past Surgical History:  Procedure Laterality Date   PLACEMENT SETON  02/06/2021     Allergies  Allergen Reactions   Statins-Hmg-Coa Reductase Inhibitors Unknown and Other (See Comments)    Myalgias   Currently taking  Crestor 10mg .    Current Outpatient Medications on File Prior to Visit  Medication Sig Dispense Refill   aspirin 81 MG EC tablet Take 81 mg by mouth once daily     azaTHIOprine (IMURAN) 50 mg tablet Take by mouth     carvediloL (COREG) 12.5 MG tablet Take 6.25 mg ( 1/2 tablet of 12.5 mg)  in the morning and 12.5 mg  in the evening     empagliflozin (JARDIANCE) 10 mg tablet Take 1 tablet by mouth once daily     inFLIXimab (REMICADE) 100 mg injection Inject into the vein     insulin  DEGLUDEC (TRESIBA FLEXTOUCH) pen injector (concentration 100 units/mL) Inject subcutaneously     insulin LISPRO (HUMALOG KWIKPEN) pen injector (concentration 100 units/mL) INJECT 8 UNITS BEFORE EACH MEAL OR AS DIRECTED     irbesartan (AVAPRO) 150 MG tablet Take 150 mg by mouth at bedtime     irbesartan (AVAPRO) 150 MG tablet Take by mouth     nitroGLYcerin (NITROSTAT) 0.4 MG SL tablet Place 0.4 mg under the tongue every 5 (five) minutes as needed     ondansetron (ZOFRAN-ODT) 4 MG disintegrating tablet Take by mouth     oxyCODONE (ROXICODONE) 5 MG immediate release tablet Take 1 tablet (5 mg total) by mouth every 6 (six) hours as needed for Pain 15 tablet 0   oxyCODONE (ROXICODONE) 5 MG immediate release tablet Take 1 tablet (5 mg total) by mouth every 6 (six) hours as needed for Pain 8 tablet 0   pantoprazole (PROTONIX) 40 MG DR tablet Take 1 tablet by mouth 2 (two) times daily     rosuvastatin (CRESTOR) 10 MG tablet      ticagrelor (BRILINTA) 90 mg tablet Take by mouth     No current facility-administered medications on file prior to visit.    Family History  Problem Relation Age of Onset   High blood pressure (Hypertension) Mother    Breast cancer Mother    High blood pressure (Hypertension) Father      Social History   Tobacco Use  Smoking Status Never  Smokeless Tobacco Never     Social History   Socioeconomic History   Marital status: Married  Tobacco Use   Smoking status: Never   Smokeless tobacco: Never  Substance and Sexual Activity   Alcohol use: Not Currently   Drug use: Not Currently   Social Determinants of Health   Food Insecurity: No Food Insecurity (12/10/2022)   Received from [facility]   Hunger Vital Sign    Worried About Running Out of Food in the Last Year: Never true    Ran Out of Food in the Last Year: Never true  Transportation Needs: No Transportation Needs (12/10/2022)   Received from [facility] - Transportation    Lack of  Transportation (Medical): No    Lack of Transportation (Non-Medical): No   Received from [facility]   Social Network    Objective:   BP 96/63   Pulse (!) 118   Temp 36.7 C (98 F)   Ht 177.8 cm (5\' 10" )   Wt 87.6 kg (193 lb 3.2 oz)   SpO2 96%   BMI 27.72 kg/m  Body mass index is 27.72 kg/m.  General: Well-developed, well-nourished, in no acute distress.   Eyes: No scleral icterus.  Pupils equal, lids normal Respiratory: Normal effort, no use of accessory muscles Musculoskeletal: Normal gait. Grossly normal ROM upper and lower extremities  Psychiatric: Normal judgement  and insight.  Normal mood and affect.  Alert, oriented x 3  Rectal: There is a large area of erythema and fluctuance to the left perianal region/medial buttock. Two vessel loop draining setons are in place.   There is no active drainage.  There are perianal erosions likely related to chronic moisture exposure.  Assessment and Plan:   Diagnoses and all orders for this visit:  Crohn's disease of small intestine without complication (CMS/HHS-HCC)  Rectal abscess    MILEZ LAGUEUX is a very pleasant 56 year old male with history of complex Crohn's disease presenting with a large left-sided perianal abscess.  Dr. Cliffton Asters also evaluated the patient and recommends further evaluation in the ER, particularly to obtain CT abd/pelvis with IV contrast to evaluate the size and location of the abscess.  We believe incision and drainage in the OR would be most beneficial given that he is on blood thinners and we need the ability to achieve hemostasis.  He and his wife provided understanding and will go to [facility] emergency room.  I spoke to the charge nurse who is aware he is on the way.  This patient encounter took moderate decision making today to perform the following: review records, take history, perform exam, counsel the patient on the diagnosis, and document encounter, findings, and plan in the EHR.  Saunders Glance,  [facility]

## 2022-12-20 NOTE — Progress Notes (Signed)
BP 139/66 (BP Location: Left Arm)   Pulse (!) 109   Temp 99.3 F (37.4 C) (Oral)   Resp 16   Ht 5' 10" (1.778 m)   Wt 88.5 kg   SpO2 100%   BMI 27.98 kg/m   CBC    Component Value Date/Time   WBC 8.4 12/20/2022 1704   RBC 3.41 (L) 12/20/2022 1704   HGB 11.7 (L) 12/20/2022 1704   HGB 13.1 11/04/2022 0915   HCT 33.8 (L) 12/20/2022 1704   HCT 38.9 11/04/2022 0915   PLT 143 (L) 12/20/2022 1704   PLT 214 11/04/2022 0915   MCV 99.1 12/20/2022 1704   MCV 99 (H) 11/04/2022 0915   MCH 34.3 (H) 12/20/2022 1704   MCHC 34.6 12/20/2022 1704   RDW 14.6 12/20/2022 1704   RDW 12.5 11/04/2022 0915   LYMPHSABS 0.6 (L) 12/20/2022 1704   MONOABS 1.0 12/20/2022 1704   EOSABS 0.1 12/20/2022 1704   BASOSABS 0.0 12/20/2022 1704   Patient seen and examined in the ED. Reviewed office note and H&P from my PA from earlier today Reviewed operative notes x 2 Nontoxic,.  Family member at bedside.  [facility] hospitalist at the bedside as well Significant induration along the left buttock extends several inches from anal verge.  2 existing setons in place, scant drainage.  Multiple old scars. Patient has not voided since 11 AM  CT scan personally reviewed Large distended bladder; significant soft tissue fluid collection on the left rectal area extending into the gluteal soft tissues  Diabetes, Recent LAD stent placement on aspirin and Brilinta Crohn's disease with fistula and perianal perirectal abscesses Hypertension  Admit to [facility] Will need I&D, exam under anesthesia timing to be determined by Dr. Connor IV antibiotic I doubt cardiology would want us to interrupt his Brilinta given recent cardiac stent.  May have to operate while patient is on Brilinta.  Will discuss with cardiology in the morning as well as our daytime surgeon N.p.o. after midnight except meds Continue blood thinners for now [facility] to address his urinary retention  I personally saw the patient and performed a substantive  portion of the medical decision making, in conjunction with the Advanced Practice provider Puja Gosai, PA-C for the treatment of  crohns with fistula, recurrent perianal/rectal abscesses.  I personally reviewed images of CT showing Large distended bladder; significant soft tissue fluid collection on the left rectal area extending into the gluteal soft tissues. I personally discussed this patient's care with [facility] about blood thinner, distended bladder. I reviewed recent lab values, vitals, and notes.  This care required high  level of medical decision making.   Daekwon Beswick M. Adamarie Izzo, MD, FACS General, Bariatric, & Minimally Invasive Surgery [facility],  A [facility]   

## 2022-12-20 NOTE — H&P (View-Only) (Signed)
BP 139/66 (BP Location: Left Arm)   Pulse (!) 109   Temp 99.3 F (37.4 C) (Oral)   Resp 16   Ht 5\' 10"  (1.778 m)   Wt 88.5 kg   SpO2 100%   BMI 27.98 kg/m   CBC    Component Value Date/Time   WBC 8.4 12/20/2022 1704   RBC 3.41 (L) 12/20/2022 1704   HGB 11.7 (L) 12/20/2022 1704   HGB 13.1 11/04/2022 0915   HCT 33.8 (L) 12/20/2022 1704   HCT 38.9 11/04/2022 0915   PLT 143 (L) 12/20/2022 1704   PLT 214 11/04/2022 0915   MCV 99.1 12/20/2022 1704   MCV 99 (H) 11/04/2022 0915   MCH 34.3 (H) 12/20/2022 1704   MCHC 34.6 12/20/2022 1704   RDW 14.6 12/20/2022 1704   RDW 12.5 11/04/2022 0915   LYMPHSABS 0.6 (L) 12/20/2022 1704   MONOABS 1.0 12/20/2022 1704   EOSABS 0.1 12/20/2022 1704   BASOSABS 0.0 12/20/2022 1704   Patient seen and examined in the ED. Reviewed office note and H&P from my PA from earlier today Reviewed operative notes x 2 Nontoxic,.  Family member at bedside.  [facility] hospitalist at the bedside as well Significant induration along the left buttock extends several inches from anal verge.  2 existing setons in place, scant drainage.  Multiple old scars. Patient has not voided since 11 AM  CT scan personally reviewed Large distended bladder; significant soft tissue fluid collection on the left rectal area extending into the gluteal soft tissues  Diabetes, Recent LAD stent placement on aspirin and Brilinta Crohn's disease with fistula and perianal perirectal abscesses Hypertension  Admit to [facility] Will need I&D, exam under anesthesia timing to be determined by Dr. Fredricka Bonine IV antibiotic I doubt cardiology would want [place] to interrupt his Brilinta given recent cardiac stent.  May have to operate while patient is on Brilinta.  Will discuss with cardiology in the morning as well as our daytime surgeon N.p.o. after midnight except meds Continue blood thinners for now [facility] to address his urinary retention  I personally saw the patient and performed a substantive  portion of the medical decision making, in conjunction with the Advanced Practice provider Hedda Slade, PA-C for the treatment of  crohns with fistula, recurrent perianal/rectal abscesses.  I personally reviewed images of CT showing Large distended bladder; significant soft tissue fluid collection on the left rectal area extending into the gluteal soft tissues. I personally discussed this patient's care with [facility] about blood thinner, distended bladder. I reviewed recent lab values, vitals, and notes.  This care required high  level of medical decision making.   Mary Sella. Andrey Campanile, MD, FACS General, Bariatric, & Minimally Invasive Surgery [facility],  [facility]

## 2022-12-20 NOTE — ED Notes (Signed)
ED TO INPATIENT HANDOFF REPORT  ED Nurse Name and Phone #:  Johnna Acosta [phone]  S Name/Age/Gender Sean Schaefer 57 y.o. male Room/Bed: 027C/027C  Code Status   Code Status: Prior  Home/SNF/Other Home Patient oriented to: self, place, time, and situation Is this baseline? Yes   Triage Complete: Triage complete  Chief Complaint Abscess, perirectal [K61.1]  Triage Note Pt arrives from [facility]. Pt sent for labs and imaging. Pt is supposed to have surgery tomorrow for his abscess on left buttocks. Pt takes Brilinita and ASA.  PA from [facility] requests CT abd/pelvis with IV contrast to evaluate the size and location of the abscess .   Allergies Allergies  Allergen Reactions   Statins Other (See Comments)    Myalgias  Currently taking Crestor 10mg .    Level of Care/Admitting Diagnosis ED Disposition     ED Disposition  Admit   Condition  --   Comment  Hospital Area: [facility] [[id number]]  Level of Care: Telemetry Surgical [[id number]]  May admit patient to [facility] or [facility] if equivalent level of care is available:: No  Covid Evaluation: Asymptomatic - no recent exposure (last 10 days) testing not required  Diagnosis: Abscess, perirectal [[id number]]  Admitting Physician: Dorcas Carrow [[id number]]  Attending Physician: Dorcas Carrow [[id number]]  Certification:: I certify this patient will need inpatient services for at least 2 midnights  Estimated Length of Stay: 3          B Medical/Surgery History Past Medical History:  Diagnosis Date   CAD (coronary artery disease)    Crohn's disease (HCC)    Diabetes mellitus (HCC)    Dilation of aorta (HCC)    Hypertension    Ulcerative colitis (HCC)    Past Surgical History:  Procedure Laterality Date   BIOPSY  12/12/2022   Procedure: BIOPSY;  Surgeon: Benancio Deeds, MD;  Location: [facility] ENDOSCOPY;  Service: Gastroenterology;;   COLON SURGERY     COLONOSCOPY     CORONARY  BALLOON ANGIOPLASTY N/A 10/20/2022   Procedure: CORONARY BALLOON ANGIOPLASTY;  Surgeon: Marykay Lex, MD;  Location: [facility] INVASIVE CV LAB;  Service: Cardiovascular;  Laterality: N/A;   CORONARY STENT INTERVENTION N/A 10/20/2022   Procedure: CORONARY STENT INTERVENTION;  Surgeon: Marykay Lex, MD;  Location: [facility] INVASIVE CV LAB;  Service: Cardiovascular;  Laterality: N/A;   ESOPHAGOGASTRODUODENOSCOPY (EGD) WITH PROPOFOL N/A 12/12/2022   Procedure: ESOPHAGOGASTRODUODENOSCOPY (EGD) WITH PROPOFOL;  Surgeon: Benancio Deeds, MD;  Location: [facility] ENDOSCOPY;  Service: Gastroenterology;  Laterality: N/A;   EYE SURGERY Left    FLEXIBLE SIGMOIDOSCOPY N/A 02/05/2021   Procedure: FLEXIBLE POUCHOSCOPY WITH BIOPSY;  Surgeon: Andria Meuse, MD;  Location: [facility] ORS;  Service: General;  Laterality: N/A;   INCISION AND DRAINAGE ABSCESS N/A 02/05/2021   Procedure: INCISION AND DRAINAGE OF PERIANAL ABSCESS;  Surgeon: Andria Meuse, MD;  Location: [facility] ORS;  Service: General;  Laterality: N/A;   INCISION AND DRAINAGE PERIRECTAL ABSCESS N/A 11/30/2020   Procedure: IRRIGATION AND DEBRIDEMENT PERIRECTAL ABSCESS;  Surgeon: Fritzi Mandes, MD;  Location: [facility] OR;  Service: General;  Laterality: N/A;   INCISION AND DRAINAGE PERIRECTAL ABSCESS Left 04/03/2021   Procedure: left peri-rectal/gluteal abscess;  Surgeon: Diamantina Monks, MD;  Location: [facility] OR;  Service: General;  Laterality: Left;   LEFT HEART CATH AND CORONARY ANGIOGRAPHY N/A 10/20/2022   Procedure: LEFT HEART CATH AND CORONARY ANGIOGRAPHY;  Surgeon: Marykay Lex, MD;  Location: [facility] INVASIVE CV LAB;  Service: Cardiovascular;  Laterality: N/A;   Perianal fistula repair  07/22/2010   PLACEMENT OF SETON N/A 02/05/2021   Procedure: PLACEMENT OF SETONS X2;  Surgeon: Andria Meuse, MD;  Location: [facility] ORS;  Service: General;  Laterality: N/A;   RECTAL EXAM UNDER ANESTHESIA N/A 02/05/2021   Procedure: ANORECTAL EXAM UNDER ANESTHESIA;  Surgeon:  Andria Meuse, MD;  Location: [facility] ORS;  Service: General;  Laterality: N/A;   TESTICLE REMOVAL Left 2000     A IV Location/Drains/Wounds Patient Lines/Drains/Airways Status     Active Line/Drains/Airways     Name Placement date Placement time Site Days   Peripheral IV 12/20/22 Right Antecubital 12/20/22  1943  Antecubital  less than 1            Intake/Output Last 24 hours No intake or output data in the 24 hours ending 12/20/22 2150  Labs/Imaging Results for orders placed or performed during the hospital encounter of 12/20/22 (from the past 48 hour(s))  CBC with Differential     Status: Abnormal   Collection Time: 12/20/22  5:04 PM  Result Value Ref Range   WBC 8.4 4.0 - 10.5 K/uL   RBC 3.41 (L) 4.22 - 5.81 MIL/uL   Hemoglobin 11.7 (L) 13.0 - 17.0 g/dL   HCT 16.1 (L) 09.6 - 04.5 %   MCV 99.1 80.0 - 100.0 fL   MCH 34.3 (H) 26.0 - 34.0 pg   MCHC 34.6 30.0 - 36.0 g/dL   RDW 40.9 81.1 - 91.4 %   Platelets 143 (L) 150 - 400 K/uL   nRBC 0.0 0.0 - 0.2 %   Neutrophils Relative % 79 %   Neutro Abs 6.6 1.7 - 7.7 K/uL   Lymphocytes Relative 7 %   Lymphs Abs 0.6 (L) 0.7 - 4.0 K/uL   Monocytes Relative 12 %   Monocytes Absolute 1.0 0.1 - 1.0 K/uL   Eosinophils Relative 1 %   Eosinophils Absolute 0.1 0.0 - 0.5 K/uL   Basophils Relative 0 %   Basophils Absolute 0.0 0.0 - 0.1 K/uL   Immature Granulocytes 1 %   Abs Immature Granulocytes 0.04 0.00 - 0.07 K/uL    Comment: Performed at [facility], [address] [place], [place] [address]  Comprehensive metabolic panel     Status: Abnormal   Collection Time: 12/20/22  5:04 PM  Result Value Ref Range   Sodium 131 (L) 135 - 145 mmol/L   Potassium 3.9 3.5 - 5.1 mmol/L   Chloride 97 (L) 98 - 111 mmol/L   CO2 23 22 - 32 mmol/L   Glucose, Bld 302 (H) 70 - 99 mg/dL    Comment: Glucose reference range applies only to samples taken after fasting for at least 8 hours.   BUN 13 6 - 20 mg/dL   Creatinine, Ser 6.21 (H) 0.61  - 1.24 mg/dL   Calcium 8.9 8.9 - 30.8 mg/dL   Total Protein 7.8 6.5 - 8.1 g/dL   Albumin 3.3 (L) 3.5 - 5.0 g/dL   AST 15 15 - 41 U/L   ALT 14 0 - 44 U/L   Alkaline Phosphatase 51 38 - 126 U/L   Total Bilirubin 1.7 (H) 0.3 - 1.2 mg/dL   GFR, Estimated 46 (L) >60 mL/min    Comment: (NOTE) Calculated using the CKD-EPI Creatinine Equation (2021)    Anion gap 11 5 - 15    Comment: Performed at [facility], [address] [place], [place] [address]  Protime-INR     Status: None   Collection Time: 12/20/22  5:04 PM  Result Value Ref Range   Prothrombin Time 14.0 11.4 - 15.2 seconds   INR 1.1 0.8 - 1.2    Comment: (NOTE) INR goal varies based on device and disease states. Performed at [facility], [address] [place], [place] [address]    CT ABDOMEN PELVIS W CONTRAST  Result Date: 12/20/2022 CLINICAL DATA:  Anorectal abscess. EXAM: CT ABDOMEN AND PELVIS WITH CONTRAST TECHNIQUE: Multidetector CT imaging of the abdomen and pelvis was performed using the standard protocol following bolus administration of intravenous contrast. RADIATION DOSE REDUCTION: This exam was performed according to the departmental dose-optimization program which includes automated exposure control, adjustment of the mA and/or kV according to patient size and/or use of iterative reconstruction technique. CONTRAST:  70mL OMNIPAQUE IOHEXOL 350 MG/ML SOLN COMPARISON:  CT 12/09/2022 FINDINGS: Lower chest: Bilateral gynecomastia. No acute basilar airspace disease or pleural effusion. Hepatobiliary: Layering gallstones. No gallbladder inflammation. Unremarkable appearance of the liver without focal liver lesion. No biliary dilatation. Pancreas: No ductal dilatation or inflammation. Spleen: Normal in size without focal abnormality. Adrenals/Urinary Tract: Normal adrenal glands. No hydronephrosis or perinephric edema. Homogeneous renal enhancement with symmetric excretion on delayed phase imaging. No renal stone or  focal renal abnormality. Urinary bladder is distended extending superior to the umbilicus, volume = 900 cm^3. No definite bladder wall thickening. Stomach/Bowel: Subtotal colonic resection with ileo anal anastomosis. Stool within the ileal anal pouch with mild bowel wall thickening again seen. Fluid collection extending from the left aspect of the rectum extends into the gluteal soft tissues, multiloculated and irregular in shape. The largest component measures at least 6.6 x 4.4 x 7 cm. Suspected seton in place. There is adjacent subcutaneous stranding in the soft tissues. Component extends along the [place] in the left perineum extending to the base of the penis with small amount of gas. No inflammation or obstruction of small bowel. Minimal distal esophageal wall thickening. Unremarkable appearance of the stomach. Vascular/Lymphatic: Mild aorto bi-iliac atherosclerosis. Reactive appearing 12 mm left inguinal nodes. Reproductive: Large prostate causes mass effect on the bladder base. Prostate spans 0.3 cm transverse. Other: No intra-abdominopelvic fluid collection. Free intra-abdominal Musculoskeletal: Degenerative change in the spine, facet predominant. Bilateral hip degenerative change. There are no acute or suspicious osseous abnormalities. No intramuscular collection. IMPRESSION: 1. Fluid collection extending from the left aspect of the rectum into the gluteal soft tissues, largest component measures at least 6.6 x 4.4 x 7 cm. Suspected seton in place. Component of the fluid collection extends along the seton in the left perineum extending to the base of the penis with small amount of gas. 2. Status post colectomy with ileal anal anastomosis. Mild wall thickening of the anorectal junction. 3. Distended urinary bladder extending superior to the umbilicus, recommend correlation for urinary retention. 4. Enlarged prostate causes mass effect on the bladder base. 5. Cholelithiasis without gallbladder inflammation.  Aortic Atherosclerosis (ICD10-I70.0). Electronically Signed   By: Narda Rutherford M.D.   On: 12/20/2022 20:19    Pending Labs Unresulted Labs (From admission, onward)    None       Vitals/Pain Today's Vitals   12/20/22 1617 12/20/22 1656 12/20/22 1932 12/20/22 2147  BP: 102/76  131/72 139/66  Pulse: (!) 117  (!) 107 (!) 109  Resp: 18  16 16   Temp: 99.7 F (37.6 C)  99.3 F (37.4 C)   TempSrc: Oral  Oral   SpO2: 99%  100% 100%  Weight:  88.5 kg    Height:  5\' 10"  (1.778 m)    PainSc:  6  6  8      Isolation Precautions No active isolations  Medications Medications  fentaNYL (SUBLIMAZE) injection 75 mcg (75 mcg Intravenous Given 12/20/22 2145)  metroNIDAZOLE (FLAGYL) IVPB 500 mg (has no administration in time range)  0.9 %  sodium chloride infusion (has no administration in time range)  piperacillin-tazobactam (ZOSYN) IVPB 3.375 g (has no administration in time range)  ticagrelor (BRILINTA) tablet 90 mg (has no administration in time range)  iohexol (OMNIPAQUE) 350 MG/ML injection 70 mL (70 mLs Intravenous Contrast Given 12/20/22 2009)  sodium chloride 0.9 % bolus 1,000 mL (1,000 mLs Intravenous New Bag/Given 12/20/22 2146)    Mobility walks     Focused Assessments Pulmonary Assessment Handoff:  Lung sounds:   O2 Device: Room Air      R Recommendations: See Admitting Provider Note  Report given to:   Additional Notes:

## 2022-12-20 NOTE — ED Triage Notes (Signed)
Pt arrives from [facility]. Pt sent for labs and imaging. Pt is supposed to have surgery tomorrow for his abscess on left buttocks. Pt takes Brilinita and ASA.  PA from [facility] requests CT abd/pelvis with IV contrast to evaluate the size and location of the abscess .

## 2022-12-20 NOTE — ED Provider Notes (Signed)
[place] EMERGENCY DEPARTMENT AT [facility] Provider Note   CSN: [id number] Arrival date & time: 12/20/22  1609     History  Chief Complaint  Patient presents with   Abscess    Sean Schaefer is a 57 y.o. male.   Abscess  Patient is a 57 year old male with a past medical history significant for Crohn's disease with multiple perirectal and gluteal abscesses has required surgery in the past, history of uncontrolled diabetes with hyperglycemia, CAD, HTN, hyponatremia  Patient states that he noticed swelling and pain in his left buttock 3 to 4 days ago and has been persistent and worsening since.  He has been able to sit down because of pain.   No fevers nausea or vomiting.   He saw his gastroenterologist this morning and then was referred to urgent surgery clinic.  He was then referred here     Home Medications Prior to Admission medications   Medication Sig Start Date End Date Taking? Authorizing Provider  aspirin EC 81 MG tablet Take 1 tablet (81 mg total) by mouth daily. Swallow whole. 10/23/22   Zannie Cove, MD  azaTHIOprine (IMURAN) 50 MG tablet Take 150 mg by mouth daily. Pt taking 3 times a day    [provider]  carvedilol (COREG) 12.5 MG tablet Take 6.25 mg ( 1/2 tablet of 12.5 mg)  in the morning and 12.5 mg  in the evening 12/06/22   Marykay Lex, MD  ciprofloxacin (CIPRO) 500 MG tablet Take 1 tablet (500 mg total) by mouth 2 (two) times daily for 14 days. 12/20/22 01/03/23  Sherrilyn Rist, MD  Continuous Glucose Sensor (FREESTYLE LIBRE 14 DAY SENSOR) MISC Apply 1 sensor to the back of upper arm every 14 days 10/23/22     empagliflozin (JARDIANCE) 10 MG TABS tablet Take 1 tablet (10 mg total) by mouth daily. 11/13/22   Joylene Grapes, NP  insulin degludec (TRESIBA FLEXTOUCH) 100 UNIT/ML FlexTouch Pen Inject 38 units into the skin once a day 90 days 01/14/22     insulin lispro (HUMALOG) 100 UNIT/ML injection Inject 8 Units into the skin 3  (three) times daily with meals.    [provider]  irbesartan (AVAPRO) 150 MG tablet Take 0.5 tablets (75 mg total) by mouth daily. 12/06/22   Marykay Lex, MD  metroNIDAZOLE (FLAGYL) 500 MG tablet Take 1 tablet (500 mg total) by mouth 2 (two) times daily for 21 days. 12/20/22 01/10/23  Sherrilyn Rist, MD  nitroGLYCERIN (NITROSTAT) 0.4 MG SL tablet Place 1 tablet (0.4 mg total) under the tongue every 5 (five) minutes as needed for chest pain. 11/04/22 02/02/23  Joylene Grapes, NP  ondansetron (ZOFRAN-ODT) 4 MG disintegrating tablet Dissolve 1 tablet (4 mg total) by mouth every 8 (eight) hours as needed for nausea or vomiting. 12/13/22   Dorcas Carrow, MD  oxyCODONE-acetaminophen (PERCOCET/ROXICET) 5-325 MG tablet Take 1-2 tablets by mouth every 6 (six) hours as needed for up to 7 days for severe pain. 12/20/22 12/27/22  Sherrilyn Rist, MD  pantoprazole (PROTONIX) 40 MG tablet Take 1 tablet (40 mg total) by mouth 2 (two) times daily. 12/13/22 12/13/23  Dorcas Carrow, MD  rosuvastatin (CRESTOR) 40 MG tablet Take 1 tablet (40 mg total) by mouth daily. 11/13/22   Joylene Grapes, NP  ticagrelor (BRILINTA) 90 MG TABS tablet Take 1 tablet (90 mg) by mouth 2 times daily. 11/13/22 11/13/23  Joylene Grapes, NP  Allergies    Statins    Review of Systems   Review of Systems  Physical Exam Updated Vital Signs BP 139/66 (BP Location: Left Arm)   Pulse (!) 109   Temp 99.3 F (37.4 C) (Oral)   Resp 16   Ht 5\' 10"  (1.778 m)   Wt 88.5 kg   SpO2 100%   BMI 27.98 kg/m  Physical Exam Vitals and nursing note reviewed.  Constitutional:      General: He is not in acute distress. HENT:     Head: Normocephalic and atraumatic.     Nose: Nose normal.  Eyes:     General: No scleral icterus. Cardiovascular:     Rate and Rhythm: Regular rhythm. Tachycardia present.     Pulses: Normal pulses.     Heart sounds: Normal heart sounds.  Pulmonary:     Effort: Pulmonary effort is normal. No  respiratory distress.     Breath sounds: No wheezing.  Abdominal:     Palpations: Abdomen is soft.     Tenderness: There is no abdominal tenderness. There is no guarding or rebound.  Genitourinary:    Comments: Left buttocks with swelling and fluctuant areas that are tender Musculoskeletal:     Cervical back: Normal range of motion.     Right lower leg: No edema.     Left lower leg: No edema.  Skin:    General: Skin is warm and dry.     Capillary Refill: Capillary refill takes less than 2 seconds.  Neurological:     Mental Status: He is alert. Mental status is at baseline.  Psychiatric:        Mood and Affect: Mood normal.        Behavior: Behavior normal.     ED Results / Procedures / Treatments   Labs (all labs ordered are listed, but only abnormal results are displayed) Labs Reviewed  CBC WITH DIFFERENTIAL/PLATELET - Abnormal; Notable for the following components:      Result Value   RBC 3.41 (*)    Hemoglobin 11.7 (*)    HCT 33.8 (*)    MCH 34.3 (*)    Platelets 143 (*)    Lymphs Abs 0.6 (*)    All other components within normal limits  COMPREHENSIVE METABOLIC PANEL - Abnormal; Notable for the following components:   Sodium 131 (*)    Chloride 97 (*)    Glucose, Bld 302 (*)    Creatinine, Ser 1.75 (*)    Albumin 3.3 (*)    Total Bilirubin 1.7 (*)    GFR, Estimated 46 (*)    All other components within normal limits  PROTIME-INR    EKG None  Radiology CT ABDOMEN PELVIS W CONTRAST  Result Date: 12/20/2022 CLINICAL DATA:  Anorectal abscess. EXAM: CT ABDOMEN AND PELVIS WITH CONTRAST TECHNIQUE: Multidetector CT imaging of the abdomen and pelvis was performed using the standard protocol following bolus administration of intravenous contrast. RADIATION DOSE REDUCTION: This exam was performed according to the departmental dose-optimization program which includes automated exposure control, adjustment of the mA and/or kV according to patient size and/or use of  iterative reconstruction technique. CONTRAST:  70mL OMNIPAQUE IOHEXOL 350 MG/ML SOLN COMPARISON:  CT 12/09/2022 FINDINGS: Lower chest: Bilateral gynecomastia. No acute basilar airspace disease or pleural effusion. Hepatobiliary: Layering gallstones. No gallbladder inflammation. Unremarkable appearance of the liver without focal liver lesion. No biliary dilatation. Pancreas: No ductal dilatation or inflammation. Spleen: Normal in size without focal abnormality. Adrenals/Urinary Tract: Normal adrenal  glands. No hydronephrosis or perinephric edema. Homogeneous renal enhancement with symmetric excretion on delayed phase imaging. No renal stone or focal renal abnormality. Urinary bladder is distended extending superior to the umbilicus, volume = 900 cm^3. No definite bladder wall thickening. Stomach/Bowel: Subtotal colonic resection with ileo anal anastomosis. Stool within the ileal anal pouch with mild bowel wall thickening again seen. Fluid collection extending from the left aspect of the rectum extends into the gluteal soft tissues, multiloculated and irregular in shape. The largest component measures at least 6.6 x 4.4 x 7 cm. Suspected seton in place. There is adjacent subcutaneous stranding in the soft tissues. Component extends along the Enoch in the left perineum extending to the base of the penis with small amount of gas. No inflammation or obstruction of small bowel. Minimal distal esophageal wall thickening. Unremarkable appearance of the stomach. Vascular/Lymphatic: Mild aorto bi-iliac atherosclerosis. Reactive appearing 12 mm left inguinal nodes. Reproductive: Large prostate causes mass effect on the bladder base. Prostate spans 0.3 cm transverse. Other: No intra-abdominopelvic fluid collection. Free intra-abdominal Musculoskeletal: Degenerative change in the spine, facet predominant. Bilateral hip degenerative change. There are no acute or suspicious osseous abnormalities. No intramuscular collection.  IMPRESSION: 1. Fluid collection extending from the left aspect of the rectum into the gluteal soft tissues, largest component measures at least 6.6 x 4.4 x 7 cm. Suspected seton in place. Component of the fluid collection extends along the seton in the left perineum extending to the base of the penis with small amount of gas. 2. Status post colectomy with ileal anal anastomosis. Mild wall thickening of the anorectal junction. 3. Distended urinary bladder extending superior to the umbilicus, recommend correlation for urinary retention. 4. Enlarged prostate causes mass effect on the bladder base. 5. Cholelithiasis without gallbladder inflammation. Aortic Atherosclerosis (ICD10-I70.0). Electronically Signed   By: Narda Rutherford M.D.   On: 12/20/2022 20:19    Procedures Procedures    Medications Ordered in ED Medications  fentaNYL (SUBLIMAZE) injection 75 mcg (75 mcg Intravenous Given 12/20/22 2145)  metroNIDAZOLE (FLAGYL) IVPB 500 mg (has no administration in time range)  0.9 %  sodium chloride infusion (has no administration in time range)  piperacillin-tazobactam (ZOSYN) IVPB 3.375 g (has no administration in time range)  ticagrelor (BRILINTA) tablet 90 mg (has no administration in time range)  iohexol (OMNIPAQUE) 350 MG/ML injection 70 mL (70 mLs Intravenous Contrast Given 12/20/22 2009)  sodium chloride 0.9 % bolus 1,000 mL (1,000 mLs Intravenous New Bag/Given 12/20/22 2146)    ED Course/ Medical Decision Making/ A&P Clinical Course as of 12/20/22 2154  Fri Dec 20, 2022  2124 Discussed with Gaynelle Adu of general surgery who will evaluate patient at bedside recommend Zosyn and hospitalist admission. [WF]    Clinical Course User Index [WF] Gailen Shelter, PA                             Medical Decision Making Amount and/or Complexity of Data Reviewed Labs: ordered. Radiology: ordered.  Risk Prescription drug management. Decision regarding hospitalization.   This patient presents  to the ED for concern of L buttucks pain, this involves a number of treatment options, and is a complaint that carries with it a moderate risk of complications and morbidity. A differential diagnosis was considered for the patient's symptoms which is discussed below:   Pilonidal abscess, perianal, perirectal, cellulitis, fracture, laceration, abrasion, contusion   Co morbidities: Discussed in HPI   Brief History:  Patient is a 57 year old male with a past medical history significant for Crohn's disease with multiple perirectal and gluteal abscesses has required surgery in the past, history of uncontrolled diabetes with hyperglycemia, CAD, HTN, hyponatremia  Patient states that he noticed swelling and pain in his left buttock 3 to 4 days ago and has been persistent and worsening since.  He has been able to sit down because of pain.   No fevers nausea or vomiting.   He saw his gastroenterologist this morning and then was referred to urgent surgery clinic.  He was then referred here    EMR reviewed including pt PMHx, past surgical history and past visits to ER.   See HPI for more details   Lab Tests:   I ordered and independently interpreted labs. Labs notable for CBC without leukocytosis some mild anemia CMP with creatinine that is elevated some     Imaging Studies:  Abnormal findings. I personally reviewed all imaging studies. Imaging notable for abscess   IMPRESSION:  1. Fluid collection extending from the left aspect of the rectum  into the gluteal soft tissues, largest component measures at least  6.6 x 4.4 x 7 cm. Suspected seton in place. Component of the fluid  collection extends along the seton in the left perineum extending to  the base of the penis with small amount of gas.  2. Status post colectomy with ileal anal anastomosis. Mild wall  thickening of the anorectal junction.  3. Distended urinary bladder extending superior to the umbilicus,  recommend  correlation for urinary retention.  4. Enlarged prostate causes mass effect on the bladder base.  5. Cholelithiasis without gallbladder inflammation.    Aortic Atherosclerosis (ICD10-I70.0).      Electronically Signed    By: Narda Rutherford M.D.    On: 12/20/2022 20:19    Cardiac Monitoring:  The patient was maintained on a cardiac monitor.  I personally viewed and interpreted the cardiac monitored which showed an underlying rhythm of: sinus tach/NSR NA   Medicines ordered:  I ordered medication including Zosyn for antibacterial coverage, IV hydration, fentanyl for pain Reevaluation of the patient after these medicines showed that the patient improved I have reviewed the patients home medicines and have made adjustments as needed   Critical Interventions:     Consults/Attending Physician   I requested consultation with Dr. Andrey Campanile of general surgery,  and discussed lab and imaging findings as well as pertinent plan - they recommend: Zosyn, hospitalist admission  Discussed with Dr. Jerral Ralph who will admit.  He discussed the case directly with Dr. Andrey Campanile and evaluated the patient  Reevaluation:  After the interventions noted above I re-evaluated patient and found that they have :improved   Social Determinants of Health:      Problem List / ED Course:  Patient with large left buttocks abscess has tachycardia in the setting of infection.  Given IV fluids, antibiotics and will admit to the hospital.  Patient has a very mild AKI blood sugars elevated here likely acute phase reactant   Dispostion:  After consideration of the diagnostic results and the patients response to treatment, I feel that the patent would benefit from admission   Final Clinical Impression(s) / ED Diagnoses Final diagnoses:  Rectal abscess  Hyperglycemia  AKI (acute kidney injury) [facility])    Rx / DC Orders ED Discharge Orders     None         Gailen Shelter, [place] 12/20/22 2310     Foley,  Raynelle Fanning, MD 12/20/22 [phone]

## 2022-12-20 NOTE — Progress Notes (Signed)
[facility] progress t Note:  History: TAYLOR BRODERSEN 12/20/2022  Referring provider: Renford Dills, MD  Reason for consult/chief complaint: Crohn's Disease and Hemorrhoids (Pt having problems with rectal pain and a lot of swelling, pt had some small amount of BRB)   Subjective  HPI: Sean Schaefer is here to follow-up for his Crohn's disease and nausea and vomiting with recent hospitalization. 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  See prior clinic notes for details of his complex care over the years.  When I saw him for this procedure, he was much improved symptomatically and radiographically on azathioprine and infliximab, he still had some active fistulizing with setons and a Penrose drain in place.  He follows with Dr. Marin Olp at [facility] for additional management of the fistula pelvic disease.  He was admitted to the hospital with a non-STEMI April 2024, and underwent drug-eluting stent placement to the culprit lesion in the circumflex as well as in the LAD.  LVEF 55 to 65%, discharged on DAPT.  He was readmitted 12/09/2022 with chest pain while at cardiac rehab, and he was found to have accelerated hypertension.  No acute coronary syndrome, but he had intractable nausea and vomiting.  Inpatient EGD done by Dr. Adela Lank on 12/12/2022, the experienced hypertension nausea and chest pain after the procedure.  EGD had no significant findings, small hiatal hernia, mild distal esophageal changes of reflux or vomiting.  CTAP without obstructive findings, naturally show findings of his known Crohn's disease (report below)  Jvion was here with his wife today, and there was a lot to review given the time it has been since his last visit with me and all the recent events.  In addition, several days ago he developed left buttock pain and swelling that has steadily worsened to the  point that he is somewhat comfortable he cannot sit down and could not sleep well last night.  There has not been any drainage from the area, he still has one seton in place, and another fell out sometime last year.  He and his wife have no recollection of having been contacted for an appointment at Dr. Lucilla Lame office last year,, nor has he reached out to that office. Frequency of BMs remains about the same as before, he has small-volume blood per anus.  Nausea and vomiting has subsided on current treatment.  He thinks a lot of it has to do with high blood pressure.  Naturally, he is frustrated by the recurrence of perianal/buttock problems related to Crohn's disease, and he has been taking all his medicines faithfully.  He is nearly out of the Imuran and his wife wondered if we need to refill it.  Last remicade 5/16//24 (500mg  - approx 5mg /kg)  Q 4 weeks - prior tried 10 mg/kg, but this gave him a headache and visual disturbance (see prior notes) ROS:  Review of Systems  Constitutional:  Positive for fatigue. Negative for appetite change and unexpected weight change.  HENT:  Negative for mouth sores and voice change.   Eyes:  Negative for pain and redness.  Respiratory:  Negative for cough and shortness of breath.   Cardiovascular:  Negative for chest pain and palpitations.  Gastrointestinal:        Perianal and buttock pain as noted above  Genitourinary:  Negative for dysuria and hematuria.  Musculoskeletal:  Negative for arthralgias and myalgias.  Skin:  Negative  for pallor and rash.  Neurological:  Negative for weakness and headaches.  Hematological:  Negative for adenopathy.     Past Medical History: Past Medical History:  Diagnosis Date   CAD (coronary artery disease)    Crohn's disease (HCC)    Diabetes mellitus (HCC)    Dilation of aorta (HCC)    Hypertension    Ulcerative colitis (HCC)      Past Surgical History: Past Surgical History:  Procedure Laterality Date    BIOPSY  12/12/2022   Procedure: BIOPSY;  Surgeon: Benancio Deeds, MD;  Location: [facility];  Service: Gastroenterology;;   COLON SURGERY     COLONOSCOPY     CORONARY BALLOON ANGIOPLASTY N/A 10/20/2022   Procedure: CORONARY BALLOON ANGIOPLASTY;  Surgeon: Marykay Lex, MD;  Location: [facility] INVASIVE CV LAB;  Service: Cardiovascular;  Laterality: N/A;   CORONARY STENT INTERVENTION N/A 10/20/2022   Procedure: CORONARY STENT INTERVENTION;  Surgeon: Marykay Lex, MD;  [facility]: [facility] INVASIVE CV LAB;  Service: Cardiovascular;  Laterality: N/A;   ESOPHAGOGASTRODUODENOSCOPY (EGD) WITH PROPOFOL N/A 12/12/2022   Procedure: ESOPHAGOGASTRODUODENOSCOPY (EGD) WITH PROPOFOL;  Surgeon: Benancio Deeds, MD;  Location: [facility] ENDOSCOPY;  Service: Gastroenterology;  Laterality: N/A;   EYE SURGERY Left    FLEXIBLE SIGMOIDOSCOPY N/A 02/05/2021   Procedure: FLEXIBLE POUCHOSCOPY WITH BIOPSY;  Surgeon: Andria Meuse, MD;  Location: [facility] ORS;  Service: General;  Laterality: N/A;   INCISION AND DRAINAGE ABSCESS N/A 02/05/2021   Procedure: INCISION AND DRAINAGE OF PERIANAL ABSCESS;  Surgeon: Andria Meuse, MD;  Location: [facility] ORS;  Service: General;  Laterality: N/A;   INCISION AND DRAINAGE PERIRECTAL ABSCESS N/A 11/30/2020   Procedure: IRRIGATION AND DEBRIDEMENT PERIRECTAL ABSCESS;  Surgeon: Fritzi Mandes, MD;  Location: [facility] OR;  Service: General;  Laterality: N/A;   INCISION AND DRAINAGE PERIRECTAL ABSCESS Left 04/03/2021   Procedure: left peri-rectal/gluteal abscess;  Surgeon: Diamantina Monks, MD;  Location: [facility] OR;  Service: General;  Laterality: Left;   LEFT HEART CATH AND CORONARY ANGIOGRAPHY N/A 10/20/2022   Procedure: LEFT HEART CATH AND CORONARY ANGIOGRAPHY;  Surgeon: Marykay Lex, MD;  Location[facility] INVASIVE CV LAB;  Service: Cardiovascular;  Laterality: N/A;   Perianal fistula repair  07/22/2010   PLACEMENT OF SETON N/A 02/05/2021   Procedure: PLACEMENT OF SETONS X2;  Surgeon: Andria Meuse, MD;  Location: [facility] ORS;  Service: General;  Laterality: N/A;   RECTAL EXAM UNDER ANESTHESIA N/A 02/05/2021   Procedure: ANORECTAL EXAM UNDER ANESTHESIA;  Surgeon: Andria Meuse, MD;  Location: [facility] ORS;  Service: General;  Laterality: N/A;   TESTICLE REMOVAL Left 2000     Family History: Family History  Problem Relation Age of Onset   Hypertension Mother    Cancer Mother    Hypertension Father    Inflammatory bowel disease Brother    Diabetes Paternal Uncle    Diabetes Paternal Grandmother    Diabetes Paternal Grandfather    Heart disease Neg Hx    Colon cancer Neg Hx    Esophageal cancer Neg Hx    Rectal cancer Neg Hx    Stomach cancer Neg Hx     Social History: Social History   Socioeconomic History   Marital status: Married    Spouse name: Not on file   Number of children: 2   Years of education: Not on file   Highest education level: Not on file  Occupational History   Not on file  Tobacco Use  Smoking status: Never   Smokeless tobacco: Never  Vaping Use   Vaping Use: Never used  Substance and Sexual Activity   Alcohol use: No   Drug use: No   Sexual activity: Yes  Other Topics Concern   Not on file  Social History Narrative   Not on file   Social Determinants of Health   Financial Resource Strain: Not on file  Food Insecurity: No Food Insecurity (12/10/2022)   Hunger Vital Sign    Worried About Running Out of Food in the Last Year: Never true    Ran Out of Food in the Last Year: Never true  Transportation Needs: No Transportation Needs (12/10/2022)   PRAPARE - Administrator, Civil Service (Medical): No    Lack of Transportation (Non-Medical): No  Physical Activity: Not on file  Stress: Not on file  Social Connections: Not on file    Allergies: Allergies  Allergen Reactions   Statins Other (See Comments)    Myalgias  Currently taking Crestor 10mg .    Outpatient Meds: Current Outpatient Medications  Medication  Sig Dispense Refill   aspirin EC 81 MG tablet Take 1 tablet (81 mg total) by mouth daily. Swallow whole. 30 tablet 1   azaTHIOprine (IMURAN) 50 MG tablet Take 150 mg by mouth daily. Pt taking 3 times a day     carvedilol (COREG) 12.5 MG tablet Take 6.25 mg ( 1/2 tablet of 12.5 mg)  in the morning and 12.5 mg  in the evening 180 tablet 1   ciprofloxacin (CIPRO) 500 MG tablet Take 1 tablet (500 mg total) by mouth 2 (two) times daily for 14 days. 28 tablet 0   Continuous Glucose Sensor (FREESTYLE LIBRE 14 DAY SENSOR) MISC Apply 1 sensor to the back of upper arm every 14 days 2 each 3   empagliflozin (JARDIANCE) 10 MG TABS tablet Take 1 tablet (10 mg total) by mouth daily. 90 tablet 1   insulin degludec (TRESIBA FLEXTOUCH) 100 UNIT/ML FlexTouch Pen Inject 38 units into the skin once a day 90 days 3500 mL 3   insulin lispro (HUMALOG) 100 UNIT/ML injection Inject 8 Units into the skin 3 (three) times daily with meals.     irbesartan (AVAPRO) 150 MG tablet Take 0.5 tablets (75 mg total) by mouth daily. 90 tablet 1   metroNIDAZOLE (FLAGYL) 500 MG tablet Take 1 tablet (500 mg total) by mouth 2 (two) times daily for 21 days. 42 tablet 0   nitroGLYCERIN (NITROSTAT) 0.4 MG SL tablet Place 1 tablet (0.4 mg total) under the tongue every 5 (five) minutes as needed for chest pain. 45 tablet 3   ondansetron (ZOFRAN-ODT) 4 MG disintegrating tablet Dissolve 1 tablet (4 mg total) by mouth every 8 (eight) hours as needed for nausea or vomiting. 20 tablet 0   pantoprazole (PROTONIX) 40 MG tablet Take 1 tablet (40 mg total) by mouth 2 (two) times daily. 60 tablet 11   rosuvastatin (CRESTOR) 40 MG tablet Take 1 tablet (40 mg total) by mouth daily. 90 tablet 1   ticagrelor (BRILINTA) 90 MG TABS tablet Take 1 tablet (90 mg) by mouth 2 times daily. 180 tablet 1   Current Facility-Administered Medications  Medication Dose Route Frequency Provider Last Rate Last Admin   Insulin Pen Needle (NOVOFINE) 10 each  1 packet  Subcutaneous QHS Ofilia Neas, PA-C          ___________________________________________________________________ Objective   Exam:  BP 132/80   Pulse (!) 114  Ht 5\' 10"  (1.778 m)   Wt 194 lb (88 kg)   BMI 27.84 kg/m  Wt Readings from Last 3 Encounters:  12/20/22 194 lb (88 kg)  12/13/22 195 lb 1.7 oz (88.5 kg)  12/05/22 201 lb (91.2 kg)    General: He is clearly uncomfortable, but he is alert conversational and appropriate.  Has to to lay on his side.  Nontoxic, pulse 100 Eyes: sclera anicteric, no redness ENT: oral mucosa moist without lesions, no cervical or supraclavicular lymphadenopathy CV: Regular, no appreciable murmur, no JVD, no peripheral edema Resp: clear to auscultation bilaterally, normal RR and effort noted GI: soft, no tenderness, with active bowel sounds. No guarding or palpable organomegaly noted. He has significant swelling with overlying erythema and tenderness in the left buttock adjacent to a fistulous opening.  No drainage could be expressed from that opening.  There is a perianal fistula as well with a seton in place, no drainage from that area. Skin; warm and dry, no rash or jaundice noted Neuro: awake, alert and oriented x 3. Normal gross motor function and fluent speech  Labs:     Latest Ref Rng & Units 12/20/2022   10:23 AM 12/12/2022   12:51 AM 12/11/2022   12:53 AM  CBC  WBC 4.0 - 10.5 K/uL 9.8  5.3  7.1   Hemoglobin 13.0 - 17.0 g/dL 16.1  09.6  04.5   Hematocrit 39.0 - 52.0 % 34.1  36.4  36.3   Platelets 150.0 - 400.0 K/uL 128.0  139  149       Latest Ref Rng & Units 12/20/2022   10:23 AM 12/11/2022   12:53 AM 12/10/2022    1:31 AM  CMP  Glucose 70 - 99 mg/dL 409  811  88   BUN 6 - 23 mg/dL 14  21  17    Creatinine 0.40 - 1.50 mg/dL 9.14  7.82  9.56   Sodium 135 - 145 mEq/L 133  135  139   Potassium 3.5 - 5.1 mEq/L 4.0  4.4  4.2   Chloride 96 - 112 mEq/L 97  101  106   CO2 19 - 32 mEq/L 22  20  19    Calcium 8.4 - 10.5 mg/dL 9.1   8.3  8.8   Total Protein 6.0 - 8.3 g/dL 7.9   7.5   Total Bilirubin 0.2 - 1.2 mg/dL 1.7   1.4   Alkaline Phos 39 - 117 U/L 48   47   AST 0 - 37 U/L 11   21   ALT 0 - 53 U/L 9   20    EGD path:  no H pylori   Radiologic Studies:  CLINICAL DATA:  Crohn's exacerbation.   EXAM: CT ABDOMEN AND PELVIS WITH CONTRAST   TECHNIQUE: Multidetector CT imaging of the abdomen and pelvis was performed using the standard protocol following bolus administration of intravenous contrast.   RADIATION DOSE REDUCTION: This exam was performed according to the departmental dose-optimization program which includes automated exposure control, adjustment of the mA and/or kV according to patient size and/or use of iterative reconstruction technique.   CONTRAST:  75mL OMNIPAQUE IOHEXOL 350 MG/ML SOLN   COMPARISON:  MRI pelvis 12/31/2021; MR enterography 07/14/2022; CT pelvis 04/03/2021   FINDINGS: Lower chest: No acute abnormality.   Hepatobiliary: Cholelithiasis without evidence of cholecystitis. Unremarkable liver. No biliary dilation.   Pancreas: Unremarkable.   Spleen: Unremarkable.   Adrenals/Urinary Tract: Normal adrenal glands. No urinary calculi or hydronephrosis. Unremarkable bladder.  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 Small stool ball in the ileoanal pouch with mild wall thickening an adjacent stranding. No evidence of obstruction. No additional bowel wall thickening. Stomach is within normal limits.   Vascular/Lymphatic: Subcentimeter perirectal lymph nodes are likely reactive. No acute vascular abnormality.   Reproductive: Enlarged prostate.   Other: No free intraperitoneal air. Partially visualized setons within the left perianal region where there is soft tissue thickening in the region of the previously seen abscess in perianal fistula.   Musculoskeletal: No acute osseous abnormality.   IMPRESSION: 1. Postoperative  change of colectomy with ileoanal pouch anastomosis with findings suggestive pouchitis. 2. Partially visualized perianal fistula.  No visible abscess. 3. Cholelithiasis without evidence of cholecystitis.     Electronically Signed   By: Minerva Fester M.D.   On: 12/09/2022 22:55 ____________________________   CLINICAL DATA:  Crohn disease. Perianal fistula. Previous colectomy with ileal pouch.   EXAM: MRI PELVIS WITHOUT AND WITH CONTRAST   TECHNIQUE: Multiplanar multisequence MR imaging of the pelvis was performed both before and after administration of intravenous contrast.   CONTRAST:  10mL GADAVIST GADOBUTROL 1 MMOL/ML IV SOLN   COMPARISON:  Pelvis CT on 04/03/2021   FINDINGS: Lower Urinary Tract: Unremarkable urinary bladder.   Bowel: Ileoanal anastomosis is again seen. Two simple right-sided perianal fistulae are seen arising from the anterior anal wall at approximately the 12 o'clock position. One of these fistulae is transsphincteric, and extends anteriorly and inferiorly into the perineum (see image 23/6). The other right-sided fistula is supralevator in location, and extends along the right lateral anal wall (e.g. image 8/6).   Several fistulae are seen arising from the left anterior lateral anal wall from the 1 to 4 o'clock positions. These fistulae show transsphincteric extension. One extends anteriorly in the base of the penis, along the left lateral wall of the corpus spongiosum. Several others show extension inferiorly and laterally into the left buttock subcutaneous tissues, exiting into the gluteal crease or skin surface of the left buttock.   Two abscesses are located in the left buttock subcutaneous tissues which are associated with these fistulae. These measure 4.1 x 1.5 cm and 1.6 x 1.0 cm on image 33/6. These show significant decrease in size compared to the large perianal abscess seen on prior CT which measures 7.6 x 5.6 cm.    Vascular/Lymphatic: No pathologically enlarged lymph nodes or other significant abnormality.   Reproductive:  No mass or other significant abnormality.   Other: None.   Musculoskeletal: No suspicious bone lesions identified.   IMPRESSION: Multiple bilateral transsphincteric perianal fistulae, left side greater than right, as described above.   Two small abscesses in the left buttock subcutaneous tissues, measuring 4.1 cm and 1.6 cm, significantly decreased in size compared to prior CT.     Electronically Signed   By: Danae Orleans M.D.   On: 12/31/2021 15:33    Assessment: Encounter Diagnoses  Name Primary?   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  He has fallen out of regular care with colorectal surgery and needs to reestablish care ASAP.  He was having intractable nausea and vomiting after cardiac event, inpatient EGD unrevealing, H. pylori negative.  Lately much improved with better control of  blood pressure and as needed antiemetics.  No obstruction on CT scan.  His current Crohn's therapy is not controlling his condition.  He was not able to tolerate a 10 mg/kg dose in the past because of a side effect with that first infusion characterized by headache and visual disturbance.  So he is on 5 mg/kg every 4 weeks.  He is also on azathioprine, and recently had nausea and vomiting.  While the latter may have been from accelerated hypertension, we should check his thiopurine metabolites before refilling and continuing Imuran.  Plan:  Ciprofloxacin 500 mg twice daily for 14 days Metronidazole 500 mg twice daily for 21 days  Labs today: CBC and CMP (see results above), infliximab drug and antibody level, thiopurine metabolites  We  were able to get [facility] an appointment to see the PA at [facility] surgery later today, and I will send this note as well as an additional message to Dr. Cliffton Asters.  We have also arranged a pelvic MRI for more detailed evaluation of the extent of this abscess and any active fistula and also to rule out pelvic abscess adjacent to his pouch.  I feel that he would benefit from multidisciplinary evaluation at an IBD referral center (both GI and IBD surgery).  I will reach out to colleagues about that and set a referral once we have his lab work and know whether or not he needs surgery for this current abscess.  A prescription for Percocet was also sent to give him relief of the pain from this abscess.   60 minutes were spent on this encounter (including chart review, history/exam, counseling/coordination of care, and documentation) > 50% of that time was spent on counseling and coordination of care.     Thank you for the courtesy of this consult.  Please call me with any questions or concerns.  Charlie Pitter III  CC: Referring provider noted above Marin Olp, MD

## 2022-12-20 NOTE — ED Notes (Signed)
Pt states that he has a known abscess in his buttocks. Was seen by his PMD and told he needs to be evaluated in the ED for  it as he is on blood thinners and has other aggravating factors.

## 2022-12-20 NOTE — Patient Instructions (Signed)
_______________________________________________________  If your blood pressure at your visit was 140/90 or greater, please contact your primary care physician to follow up on this.  _______________________________________________________  If you are age 55 or older, your body mass index should be between 23-30. Your Body mass index is 27.84 kg/m. If this is out of the aforementioned range listed, please consider follow up with your Primary Care Provider.  If you are age 71 or younger, your body mass index should be between 19-25. Your Body mass index is 27.84 kg/m. If this is out of the aformentioned range listed, please consider follow up with your Primary Care Provider.   ________________________________________________________  The [facility] providers would like to encourage you to use [facility] to communicate with providers for non-urgent requests or questions.  Due to long hold times on the telephone, sending your provider a message by [facility] may be a faster and more efficient way to get a response.  Please allow 48 business hours for a response.  Please remember that this is for non-urgent requests.  _______________________________________________________  Sean Schaefer have been scheduled for an MRI at [facility] on 12-30-2022. Your appointment time is 6:30pm. Please arrive to admitting (at main entrance of the hospital) 30 minutes prior to your appointment time for registration purposes. Please make certain not to have anything to eat or drink 6 hours prior to your test. In addition, if you have any metal in your body, have a pacemaker or defibrillator, please be sure to let your ordering physician know. This test typically takes 45 minutes to 1 hour to complete. Should you need to reschedule, please call [phone] to do so.  Due to recent changes in healthcare laws, you may see the results of your imaging and laboratory studies on MyChart before your provider has had a chance to review  them.  We understand that in some cases there may be results that are confusing or concerning to you. Not all laboratory results come back in the same time frame and the provider may be waiting for multiple results in order to interpret others.  Please give [place] 48 hours in order for your provider to thoroughly review all the results before contacting the office for clarification of your results.   We have scheduled you an appointment today with [facility]   [address], [place], [place] [address]  Phone: [phone]

## 2022-12-20 NOTE — Progress Notes (Signed)
Pharmacy Antibiotic Note  DEKE RENCH is a 57 y.o. male admitted on 12/20/2022 with  wound infection .  Pharmacy has been consulted for Zosyn dosing. SCr 1.75 on presentation.  Plan: Zosyn 3.375g IV ( infusion) x1; then 3.375g IV q8h (4h infusion) Monitor clinical progress, c/s, renal function F/u de-escalation plan/LOT   Height: 5\' 10"  (177.8 cm) Weight: 88.5 kg (195 lb) IBW/kg (Calculated) : 73  Temp (24hrs), Avg:99.5 F (37.5 C), Min:99.3 F (37.4 C), Max:99.7 F (37.6 C)  Recent Labs  Lab 12/20/22 1023 12/20/22 1704  WBC 9.8 8.4  CREATININE 1.47 1.75*    Estimated Creatinine Clearance: 54.1 mL/min (A) (by C-G formula based on SCr of 1.75 mg/dL (H)).    Allergies  Allergen Reactions   Statins Other (See Comments)    Myalgias  Currently taking Crestor 10mg .    Leia Alf, PharmD, BCPS Please check AMION for all [facility] contact numbers Clinical Pharmacist 12/20/2022 10:20 PM

## 2022-12-20 NOTE — H&P (Signed)
History and Physical    Sean Schaefer WGN:[id number] DOB: 04/27/1966 DOA: 12/20/2022  PCP: Renford Dills, MD  Patient coming from: Home  I have personally briefly reviewed patient's old medical records available.   Chief Complaint: Pain in the rectal area  HPI: Sean Schaefer is a 57 y.o. male with medical history significant of type 2 diabetes on insulin, recent LAD stenting on March 31 currently on aspirin and Brilinta, recent admission for intractable nausea, Crohn's disease with multiple perirectal abscesses in the past currently on azathioprine and Remicade presents to the emergency room as directed from surgery office for admission, inpatient procedure for perirectal abscess.  According to the patient, about 3 days ago he started having discomfort on his rectal area, more on the left side, severe pain 9 out of 10 worse with mobility and sitting on the buttock, unable to lay comfortable.  Without fever.  Without drainage.  Denies any nausea vomiting abdominal pain or chest pain.  He is able to tolerate regular diet.  He has not urinated since 11 AM today.  Taken aspirin and morning dose of Brilinta today.  He went to see GI doctor today who noted to have abscess, sent to surgery office and ultimately directed to ER. ED Course: Hemodynamically stable.  White count is normal.  Blood sugars are elevated.  Patient is in much pain and relieved with fentanyl.  CT scan with about 7 cm fluid collection left buttock.  Seen with surgery at the bedside, probable I&D tomorrow.  Also recommended to involve cardiology.  Recommended to continue Brilinta.  Zosyn and Flagyl was started.  His creatinine 1.75 that was normal in early labs.  Review of Systems: all systems are reviewed and pertinent positive as per HPI otherwise rest are negative.    Past Medical History:  Diagnosis Date   CAD (coronary artery disease)    Crohn's disease (HCC)    Diabetes mellitus (HCC)    Dilation of aorta (HCC)     Hypertension    Ulcerative colitis (HCC)     Past Surgical History:  Procedure Laterality Date   BIOPSY  12/12/2022   Procedure: BIOPSY;  Surgeon: Benancio Deeds, MD;  Location: [facility] ENDOSCOPY;  Service: Gastroenterology;;   COLON SURGERY     COLONOSCOPY     CORONARY BALLOON ANGIOPLASTY N/A 10/20/2022   Procedure: CORONARY BALLOON ANGIOPLASTY;  Surgeon: Marykay Lex, MD;  Location: [facility] INVASIVE CV LAB;  Service: Cardiovascular;  Laterality: N/A;   CORONARY STENT INTERVENTION N/A 10/20/2022   Procedure: CORONARY STENT INTERVENTION;  Surgeon: Marykay Lex, MD;  Location: [facility] INVASIVE CV LAB;  Service: Cardiovascular;  Laterality: N/A;   ESOPHAGOGASTRODUODENOSCOPY (EGD) WITH PROPOFOL N/A 12/12/2022   Procedure: ESOPHAGOGASTRODUODENOSCOPY (EGD) WITH PROPOFOL;  Surgeon: Benancio Deeds, MD;  Location: [facility] ENDOSCOPY;  Service: Gastroenterology;  Laterality: N/A;   EYE SURGERY Left    FLEXIBLE SIGMOIDOSCOPY N/A 02/05/2021   Procedure: FLEXIBLE POUCHOSCOPY WITH BIOPSY;  Surgeon: Andria Meuse, MD;  Location: [facility] ORS;  Service: General;  Laterality: N/A;   INCISION AND DRAINAGE ABSCESS N/A 02/05/2021   Procedure: INCISION AND DRAINAGE OF PERIANAL ABSCESS;  Surgeon: Andria Meuse, MD;  Location: [facility] ORS;  Service: General;  Laterality: N/A;   INCISION AND DRAINAGE PERIRECTAL ABSCESS N/A 11/30/2020   Procedure: IRRIGATION AND DEBRIDEMENT PERIRECTAL ABSCESS;  Surgeon: Fritzi Mandes, MD;  Location: [facility] OR;  Service: General;  Laterality: N/A;   INCISION AND DRAINAGE PERIRECTAL ABSCESS Left 04/03/2021   Procedure:  left peri-rectal/gluteal abscess;  Surgeon: Diamantina Monks, MD;  Location: [facility] OR;  Service: General;  Laterality: Left;   LEFT HEART CATH AND CORONARY ANGIOGRAPHY N/A 10/20/2022   Procedure: LEFT HEART CATH AND CORONARY ANGIOGRAPHY;  Surgeon: Marykay Lex, MD;  Location: [facility] INVASIVE CV LAB;  Service: Cardiovascular;  Laterality: N/A;   Perianal fistula repair   07/22/2010   PLACEMENT OF SETON N/A 02/05/2021   Procedure: PLACEMENT OF SETONS X2;  Surgeon: Andria Meuse, MD;  Location: [facility] ORS;  Service: General;  Laterality: N/A;   RECTAL EXAM UNDER ANESTHESIA N/A 02/05/2021   Procedure: ANORECTAL EXAM UNDER ANESTHESIA;  Surgeon: Andria Meuse, MD;  Location: [facility] ORS;  Service: General;  Laterality: N/A;   TESTICLE REMOVAL Left 2000    Social history   reports that he has never smoked. He has never used smokeless tobacco. He reports that he does not drink alcohol and does not use drugs.  Allergies  Allergen Reactions   Statins Other (See Comments)    Myalgias  Currently taking Crestor 10mg .    Family History  Problem Relation Age of Onset   Hypertension Mother    Cancer Mother    Hypertension Father    Inflammatory bowel disease Brother    Diabetes Paternal Uncle    Diabetes Paternal Grandmother    Diabetes Paternal Grandfather    Heart disease Neg Hx    Colon cancer Neg Hx    Esophageal cancer Neg Hx    Rectal cancer Neg Hx    Stomach cancer Neg Hx      Prior to Admission medications   Medication Sig Start Date End Date Taking? Authorizing Provider  aspirin EC 81 MG tablet Take 1 tablet (81 mg total) by mouth daily. Swallow whole. 10/23/22   Zannie Cove, MD  azaTHIOprine (IMURAN) 50 MG tablet Take 150 mg by mouth daily. Pt taking 3 times a day    [provider]  carvedilol (COREG) 12.5 MG tablet Take 6.25 mg ( 1/2 tablet of 12.5 mg)  in the morning and 12.5 mg  in the evening 12/06/22   Marykay Lex, MD  ciprofloxacin (CIPRO) 500 MG tablet Take 1 tablet (500 mg total) by mouth 2 (two) times daily for 14 days. 12/20/22 01/03/23  Sherrilyn Rist, MD  Continuous Glucose Sensor (FREESTYLE LIBRE 14 DAY SENSOR) MISC Apply 1 sensor to the back of upper arm every 14 days 10/23/22     empagliflozin (JARDIANCE) 10 MG TABS tablet Take 1 tablet (10 mg total) by mouth daily. 11/13/22   Joylene Grapes, NP  insulin  degludec (TRESIBA FLEXTOUCH) 100 UNIT/ML FlexTouch Pen Inject 38 units into the skin once a day 90 days 01/14/22     insulin lispro (HUMALOG) 100 UNIT/ML injection Inject 8 Units into the skin 3 (three) times daily with meals.    [provider]  irbesartan (AVAPRO) 150 MG tablet Take 0.5 tablets (75 mg total) by mouth daily. 12/06/22   Marykay Lex, MD  metroNIDAZOLE (FLAGYL) 500 MG tablet Take 1 tablet (500 mg total) by mouth 2 (two) times daily for 21 days. 12/20/22 01/10/23  Sherrilyn Rist, MD  nitroGLYCERIN (NITROSTAT) 0.4 MG SL tablet Place 1 tablet (0.4 mg total) under the tongue every 5 (five) minutes as needed for chest pain. 11/04/22 02/02/23  Joylene Grapes, NP  ondansetron (ZOFRAN-ODT) 4 MG disintegrating tablet Dissolve 1 tablet (4 mg total) by mouth every 8 (eight) hours as needed  for nausea or vomiting. 12/13/22   Dorcas Carrow, MD  oxyCODONE-acetaminophen (PERCOCET/ROXICET) 5-325 MG tablet Take 1-2 tablets by mouth every 6 (six) hours as needed for up to 7 days for severe pain. 12/20/22 12/27/22  Sherrilyn Rist, MD  pantoprazole (PROTONIX) 40 MG tablet Take 1 tablet (40 mg total) by mouth 2 (two) times daily. 12/13/22 12/13/23  Dorcas Carrow, MD  rosuvastatin (CRESTOR) 40 MG tablet Take 1 tablet (40 mg total) by mouth daily. 11/13/22   Joylene Grapes, NP  ticagrelor (BRILINTA) 90 MG TABS tablet Take 1 tablet (90 mg) by mouth 2 times daily. 11/13/22 11/13/23  Joylene Grapes, NP    Physical Exam: Vitals:   12/20/22 1617 12/20/22 1656 12/20/22 1932 12/20/22 2147  BP: 102/76  131/72 139/66  Pulse: (!) 117  (!) 107 (!) 109  Resp: 18  16 16   Temp: 99.7 F (37.6 C)  99.3 F (37.4 C)   TempSrc: Oral  Oral   SpO2: 99%  100% 100%  Weight:  88.5 kg    Height:  5\' 10"  (1.778 m)      Constitutional: NAD, mild discomfort with pain, appropriately anxious. Vitals:   12/20/22 1617 12/20/22 1656 12/20/22 1932 12/20/22 2147  BP: 102/76  131/72 139/66  Pulse: (!) 117  (!) 107 (!)  109  Resp: 18  16 16   Temp: 99.7 F (37.6 C)  99.3 F (37.4 C)   TempSrc: Oral  Oral   SpO2: 99%  100% 100%  Weight:  88.5 kg    Height:  5\' 10"  (1.778 m)     Eyes: PERRL, lids and conjunctivae normal ENMT: Mucous membranes are moist. Posterior pharynx clear of any exudate or lesions.Normal dentition.  Neck: normal, supple, no masses, no thyromegaly Respiratory: clear to auscultation bilaterally, no wheezing, no crackles. Normal respiratory effort. No accessory muscle use.  Cardiovascular: Regular rate and rhythm, no murmurs / rubs / gallops. No extremity edema. 2+ pedal pulses. No carotid bruits.  Abdomen: no tenderness, no masses palpated. No hepatosplenomegaly. Bowel sounds positive.  Perirectal exam with 2 setons in place, multiple nodularity and erythema mostly on the left side of the anal opening.  Tender to palpate. Musculoskeletal: no clubbing / cyanosis. No joint deformity upper and lower extremities. Good ROM, no contractures. Normal muscle tone.  Skin: no rashes, lesions, ulcers. No induration Neurologic: CN 2-12 grossly intact. Sensation intact, DTR normal. Strength 5/5 in all 4.  Psychiatric: Normal judgment and insight. Alert and oriented x 3. Normal mood.     Labs on Admission: I have personally reviewed following labs and imaging studies  CBC: Recent Labs  Lab 12/20/22 1023 12/20/22 1704  WBC 9.8 8.4  NEUTROABS 8.3* 6.6  HGB 11.5* 11.7*  HCT 34.1* 33.8*  MCV 99.9 99.1  PLT 128.0* 143*   Basic Metabolic Panel: Recent Labs  Lab 12/20/22 1023 12/20/22 1704  NA 133* 131*  K 4.0 3.9  CL 97 97*  CO2 22 23  GLUCOSE 236* 302*  BUN 14 13  CREATININE 1.47 1.75*  CALCIUM 9.1 8.9   GFR: Estimated Creatinine Clearance: 54.1 mL/min (A) (by C-G formula based on SCr of 1.75 mg/dL (H)). Liver Function Tests: Recent Labs  Lab 12/20/22 1023 12/20/22 1704  AST 11 15  ALT 9 14  ALKPHOS 48 51  BILITOT 1.7* 1.7*  PROT 7.9 7.8  ALBUMIN 3.6 3.3*   No results  for input(s): "LIPASE", "AMYLASE" in the last 168 hours. No results for input(s): "AMMONIA"  in the last 168 hours. Coagulation Profile: Recent Labs  Lab 12/20/22 1704  INR 1.1   Cardiac Enzymes: No results for input(s): "CKTOTAL", "CKMB", "CKMBINDEX", "TROPONINI" in the last 168 hours. BNP (last 3 results) No results for input(s): "PROBNP" in the last 8760 hours. HbA1C: No results for input(s): "HGBA1C" in the last 72 hours. CBG: No results for input(s): "GLUCAP" in the last 168 hours. Lipid Profile: No results for input(s): "CHOL", "HDL", "LDLCALC", "TRIG", "CHOLHDL", "LDLDIRECT" in the last 72 hours. Thyroid Function Tests: No results for input(s): "TSH", "T4TOTAL", "FREET4", "T3FREE", "THYROIDAB" in the last 72 hours. Anemia Panel: No results for input(s): "VITAMINB12", "FOLATE", "FERRITIN", "TIBC", "IRON", "RETICCTPCT" in the last 72 hours. Urine analysis:    Component Value Date/Time   COLORURINE YELLOW 10/19/2022 1955   APPEARANCEUR CLEAR 10/19/2022 1955   LABSPEC 1.026 10/19/2022 1955   PHURINE 5.0 10/19/2022 1955   GLUCOSEU >=500 (A) 10/19/2022 1955   HGBUR SMALL (A) 10/19/2022 1955   BILIRUBINUR NEGATIVE 10/19/2022 1955   KETONESUR 5 (A) 10/19/2022 1955   PROTEINUR 100 (A) 10/19/2022 1955   NITRITE NEGATIVE 10/19/2022 1955   LEUKOCYTESUR NEGATIVE 10/19/2022 1955    Radiological Exams on Admission: CT ABDOMEN PELVIS W CONTRAST  Result Date: 12/20/2022 CLINICAL DATA:  Anorectal abscess. EXAM: CT ABDOMEN AND PELVIS WITH CONTRAST TECHNIQUE: Multidetector CT imaging of the abdomen and pelvis was performed using the standard protocol following bolus administration of intravenous contrast. RADIATION DOSE REDUCTION: This exam was performed according to the departmental dose-optimization program which includes automated exposure control, adjustment of the mA and/or kV according to patient size and/or use of iterative reconstruction technique. CONTRAST:  70mL OMNIPAQUE IOHEXOL  350 MG/ML SOLN COMPARISON:  CT 12/09/2022 FINDINGS: Lower chest: Bilateral gynecomastia. No acute basilar airspace disease or pleural effusion. Hepatobiliary: Layering gallstones. No gallbladder inflammation. Unremarkable appearance of the liver without focal liver lesion. No biliary dilatation. Pancreas: No ductal dilatation or inflammation. Spleen: Normal in size without focal abnormality. Adrenals/Urinary Tract: Normal adrenal glands. No hydronephrosis or perinephric edema. Homogeneous renal enhancement with symmetric excretion on delayed phase imaging. No renal stone or focal renal abnormality. Urinary bladder is distended extending superior to the umbilicus, volume = 900 cm^3. No definite bladder wall thickening. Stomach/Bowel: Subtotal colonic resection with ileo anal anastomosis. Stool within the ileal anal pouch with mild bowel wall thickening again seen. Fluid collection extending from the left aspect of the rectum extends into the gluteal soft tissues, multiloculated and irregular in shape. The largest component measures at least 6.6 x 4.4 x 7 cm. Suspected seton in place. There is adjacent subcutaneous stranding in the soft tissues. Component extends along the [place] in the left perineum extending to the base of the penis with small amount of gas. No inflammation or obstruction of small bowel. Minimal distal esophageal wall thickening. Unremarkable appearance of the stomach. Vascular/Lymphatic: Mild aorto bi-iliac atherosclerosis. Reactive appearing 12 mm left inguinal nodes. Reproductive: Large prostate causes mass effect on the bladder base. Prostate spans 0.3 cm transverse. Other: No intra-abdominopelvic fluid collection. Free intra-abdominal Musculoskeletal: Degenerative change in the spine, facet predominant. Bilateral hip degenerative change. There are no acute or suspicious osseous abnormalities. No intramuscular collection. IMPRESSION: 1. Fluid collection extending from the left aspect of the  rectum into the gluteal soft tissues, largest component measures at least 6.6 x 4.4 x 7 cm. Suspected seton in place. Component of the fluid collection extends along the seton in the left perineum extending to the base of the penis with small  amount of gas. 2. Status post colectomy with ileal anal anastomosis. Mild wall thickening of the anorectal junction. 3. Distended urinary bladder extending superior to the umbilicus, recommend correlation for urinary retention. 4. Enlarged prostate causes mass effect on the bladder base. 5. Cholelithiasis without gallbladder inflammation. Aortic Atherosclerosis (ICD10-I70.0). Electronically Signed   By: Narda Rutherford M.D.   On: 12/20/2022 20:19    EKG: Independently reviewed. 5/23 reviewed . Normal sinus rhythm, no acute changes   Assessment/Plan Principal Problem:   Abscess, perirectal Active Problems:   Crohn's disease (HCC)   Uncontrolled type 2 diabetes mellitus with hyperglycemia, with long-term current use of insulin (HCC)   AKI (acute kidney injury) (HCC)     Rectal abscess in a patient with Crohn's disease, previous multiple procedures. Admit, clears, n.p.o. past midnight, IV antibiotics with Zosyn and Flagyl.  Adequate pain medications.  Maintenance IV fluids.  Seen with surgery at the bedside.  Possible surgery tomorrow morning.  2.  Coronary artery disease with recent LAD stenting: On aspirin and Brilinta.  Discussed with surgery at bedside.  Recommended to continue aspirin and Brilinta.  May take to surgery on antiplatelet therapy.  Recommended to involve cardiology perioperative, I have sent staff message to cardiology to consult on the patient in the morning. Currently without any active chest pain.  Continue aspirin, Brilinta, statin, Coreg.  3.  Acute kidney injury, urinary retention: Encourage voiding.  If unable, will need a Foley catheter.  Maintenance IV fluids after a bolus fluid now.  4.  Type 2 diabetes, uncontrolled with  hyperglycemia: Resume half dose of long-acting insulin as he is going to be NPO.  Resume prandial insulin when he is eating.  Keep on sliding scale insulin.  5.  Essential hypertension, blood pressure stable.  Resume home medications.   DVT prophylaxis: Lovenox Code Status: Full code Family Communication: Wife at bedside Disposition Plan: Home when stable Consults called: General surgery.  Message sent to cardiology. Admission status: Inpatient.  Monitor bed.   Dorcas Carrow MD [facility] Pager [phone]

## 2022-12-21 ENCOUNTER — Encounter (HOSPITAL_COMMUNITY)
Admission: EM | Disposition: A | Discharge status: Home / Self Care | Payer: Self-pay | Source: Home / Self Care | Attending: Internal Medicine

## 2022-12-21 ENCOUNTER — Inpatient Hospital Stay (HOSPITAL_COMMUNITY): Payer: 59 | Admitting: Anesthesiology

## 2022-12-21 ENCOUNTER — Encounter (HOSPITAL_COMMUNITY): Payer: Self-pay | Admitting: Internal Medicine

## 2022-12-21 ENCOUNTER — Other Ambulatory Visit: Payer: Self-pay

## 2022-12-21 DIAGNOSIS — Z955 Presence of coronary angioplasty implant and graft: Secondary | ICD-10-CM

## 2022-12-21 DIAGNOSIS — I2510 Atherosclerotic heart disease of native coronary artery without angina pectoris: Secondary | ICD-10-CM | POA: Diagnosis not present

## 2022-12-21 DIAGNOSIS — K610 Anal abscess: Secondary | ICD-10-CM

## 2022-12-21 DIAGNOSIS — R338 Other retention of urine: Secondary | ICD-10-CM

## 2022-12-21 DIAGNOSIS — I252 Old myocardial infarction: Secondary | ICD-10-CM

## 2022-12-21 DIAGNOSIS — K611 Rectal abscess: Secondary | ICD-10-CM | POA: Diagnosis not present

## 2022-12-21 DIAGNOSIS — I119 Hypertensive heart disease without heart failure: Secondary | ICD-10-CM | POA: Diagnosis not present

## 2022-12-21 DIAGNOSIS — N179 Acute kidney failure, unspecified: Secondary | ICD-10-CM | POA: Diagnosis not present

## 2022-12-21 HISTORY — PX: INCISION AND DRAINAGE ABSCESS: SHX5864

## 2022-12-21 LAB — BASIC METABOLIC PANEL
Anion gap: 12 (ref 5–15)
BUN: 11 mg/dL (ref 6–20)
CO2: 20 mmol/L — ABNORMAL LOW (ref 22–32)
Calcium: 8.4 mg/dL — ABNORMAL LOW (ref 8.9–10.3)
Chloride: 100 mmol/L (ref 98–111)
Creatinine, Ser: 1.54 mg/dL — ABNORMAL HIGH (ref 0.61–1.24)
GFR, Estimated: 53 mL/min — ABNORMAL LOW (ref >60)
Glucose, Bld: 196 mg/dL — ABNORMAL HIGH (ref 70–99)
Potassium: 3.3 mmol/L — ABNORMAL LOW (ref 3.5–5.1)
Sodium: 132 mmol/L — ABNORMAL LOW (ref 135–145)

## 2022-12-21 LAB — MAGNESIUM: Magnesium: 1.8 mg/dL (ref 1.7–2.4)

## 2022-12-21 LAB — CBC
HCT: 29.3 % — ABNORMAL LOW (ref 39.0–52.0)
Hemoglobin: 9.9 g/dL — ABNORMAL LOW (ref 13.0–17.0)
MCH: 32.9 pg (ref 26.0–34.0)
MCHC: 33.8 g/dL (ref 30.0–36.0)
MCV: 97.3 fL (ref 80.0–100.0)
Platelets: 119 10*3/uL — ABNORMAL LOW (ref 150–400)
RBC: 3.01 MIL/uL — ABNORMAL LOW (ref 4.22–5.81)
RDW: 14.4 % (ref 11.5–15.5)
WBC: 7.4 10*3/uL (ref 4.0–10.5)
nRBC: 0 % (ref 0.0–0.2)

## 2022-12-21 LAB — GLUCOSE, CAPILLARY
Glucose-Capillary: 176 mg/dL — ABNORMAL HIGH (ref 70–99)
Glucose-Capillary: 148 mg/dL — ABNORMAL HIGH (ref 70–99)
Glucose-Capillary: 112 mg/dL — ABNORMAL HIGH (ref 70–99)
Glucose-Capillary: 110 mg/dL — ABNORMAL HIGH (ref 70–99)
Glucose-Capillary: 110 mg/dL — ABNORMAL HIGH (ref 70–99)
Glucose-Capillary: 122 mg/dL — ABNORMAL HIGH (ref 70–99)
Glucose-Capillary: 266 mg/dL — ABNORMAL HIGH (ref 70–99)

## 2022-12-21 LAB — SURGICAL PCR SCREEN
MRSA, PCR: NEGATIVE
Staphylococcus aureus: NEGATIVE

## 2022-12-21 SURGERY — INCISION AND DRAINAGE, ABSCESS
Anesthesia: General

## 2022-12-21 MED ORDER — ACETAMINOPHEN 500 MG PO TABS: 1000.0000 mg | ORAL_TABLET | Freq: Once | ORAL | Status: DC | PRN

## 2022-12-21 MED ORDER — CHLORHEXIDINE GLUCONATE 0.12 % MT SOLN
OROMUCOSAL | Status: AC
  Administered 2022-12-21: 15 mL via OROMUCOSAL
  Filled 2022-12-21: qty 15

## 2022-12-21 MED ORDER — ONDANSETRON HCL 4 MG/2ML IJ SOLN
INTRAMUSCULAR | Status: DC | PRN
  Administered 2022-12-21: 4 mg via INTRAVENOUS

## 2022-12-21 MED ORDER — 0.9 % SODIUM CHLORIDE (POUR BTL) OPTIME
TOPICAL | Status: DC | PRN
  Administered 2022-12-21: 1000 mL

## 2022-12-21 MED ORDER — EPHEDRINE SULFATE (PRESSORS) 50 MG/ML IJ SOLN
INTRAMUSCULAR | Status: DC | PRN
  Administered 2022-12-21 (×2): 5 mg via INTRAVENOUS

## 2022-12-21 MED ORDER — LACTATED RINGERS IV SOLN: INTRAVENOUS | Status: DC

## 2022-12-21 MED ORDER — MIDAZOLAM HCL 2 MG/2ML IJ SOLN
INTRAMUSCULAR | Status: DC | PRN
  Administered 2022-12-21: 2 mg via INTRAVENOUS

## 2022-12-21 MED ORDER — LACTATED RINGERS IV SOLN: INTRAVENOUS | Status: DC | PRN

## 2022-12-21 MED ORDER — INSULIN ASPART 100 UNIT/ML IJ SOLN: 0.0000 [IU] | INTRAMUSCULAR | Status: DC | PRN

## 2022-12-21 MED ORDER — OXYCODONE HCL 5 MG/5ML PO SOLN: 5.0000 mg | Freq: Once | ORAL | Status: DC | PRN

## 2022-12-21 MED ORDER — ACETAMINOPHEN 160 MG/5ML PO SOLN: 1000.0000 mg | Freq: Once | ORAL | Status: DC | PRN

## 2022-12-21 MED ORDER — INSULIN GLARGINE-YFGN 100 UNIT/ML ~~LOC~~ SOLN
20.0000 [IU] | Freq: Every day | SUBCUTANEOUS | Status: DC
  Administered 2022-12-21 – 2022-12-23 (×3): 20 [IU] via SUBCUTANEOUS
  Filled 2022-12-21 (×4): qty 0.2

## 2022-12-21 MED ORDER — OXYCODONE HCL 5 MG PO TABS: 5.0000 mg | ORAL_TABLET | Freq: Once | ORAL | Status: DC | PRN

## 2022-12-21 MED ORDER — ACETAMINOPHEN 10 MG/ML IV SOLN: 1000.0000 mg | Freq: Once | INTRAVENOUS | Status: DC | PRN

## 2022-12-21 MED ORDER — FENTANYL CITRATE (PF) 100 MCG/2ML IJ SOLN: 25.0000 ug | INTRAMUSCULAR | Status: DC | PRN

## 2022-12-21 MED ORDER — POTASSIUM CHLORIDE 10 MEQ/100ML IV SOLN
10.0000 meq | INTRAVENOUS | Status: AC
  Administered 2022-12-21 (×4): 10 meq via INTRAVENOUS
  Filled 2022-12-21 (×4): qty 100

## 2022-12-21 MED ORDER — DEXAMETHASONE SODIUM PHOSPHATE 10 MG/ML IJ SOLN
INTRAMUSCULAR | Status: DC | PRN
  Administered 2022-12-21: 5 mg via INTRAVENOUS

## 2022-12-21 MED ORDER — MIDAZOLAM HCL 2 MG/2ML IJ SOLN
INTRAMUSCULAR | Status: AC
  Filled 2022-12-21: qty 2

## 2022-12-21 MED ORDER — FENTANYL CITRATE (PF) 250 MCG/5ML IJ SOLN
INTRAMUSCULAR | Status: DC | PRN
  Administered 2022-12-21: 25 ug via INTRAVENOUS
  Administered 2022-12-21: 50 ug via INTRAVENOUS
  Administered 2022-12-21: 25 ug via INTRAVENOUS

## 2022-12-21 MED ORDER — PHENYLEPHRINE 80 MCG/ML (10ML) SYRINGE FOR IV PUSH (FOR BLOOD PRESSURE SUPPORT)
PREFILLED_SYRINGE | INTRAVENOUS | Status: DC | PRN
  Administered 2022-12-21 (×3): 80 ug via INTRAVENOUS

## 2022-12-21 MED ORDER — ORAL CARE MOUTH RINSE: 15.0000 mL | Freq: Once | OROMUCOSAL | Status: AC

## 2022-12-21 MED ORDER — LIDOCAINE 2% (20 MG/ML) 5 ML SYRINGE
INTRAMUSCULAR | Status: DC | PRN
  Administered 2022-12-21: 60 mg via INTRAVENOUS

## 2022-12-21 MED ORDER — PROPOFOL 10 MG/ML IV BOLUS
INTRAVENOUS | Status: DC | PRN
  Administered 2022-12-21: 200 mg via INTRAVENOUS

## 2022-12-21 MED ORDER — FENTANYL CITRATE (PF) 250 MCG/5ML IJ SOLN
INTRAMUSCULAR | Status: AC
  Filled 2022-12-21: qty 5

## 2022-12-21 MED ORDER — CHLORHEXIDINE GLUCONATE 0.12 % MT SOLN: 15.0000 mL | Freq: Once | OROMUCOSAL | Status: AC

## 2022-12-21 SURGICAL SUPPLY — 29 items
BAG COUNTER SPONGE SURGICOUNT (BAG) ×1 IMPLANT
BAG SPNG CNTER NS LX DISP (BAG) ×1
BLADE CLIPPER SURG (BLADE) IMPLANT
BNDG GAUZE DERMACEA FLUFF 4 (GAUZE/BANDAGES/DRESSINGS) IMPLANT
BNDG GZE 12X3 1 PLY HI ABS (GAUZE/BANDAGES/DRESSINGS) ×1
BNDG GZE DERMACEA 4 6PLY (GAUZE/BANDAGES/DRESSINGS)
BNDG STRETCH GAUZE 3IN X12FT (GAUZE/BANDAGES/DRESSINGS) IMPLANT
CANISTER SUCT 3000ML PPV (MISCELLANEOUS) ×1 IMPLANT
COVER SURGICAL LIGHT HANDLE (MISCELLANEOUS) ×1 IMPLANT
DRAPE LAPAROSCOPIC ABDOMINAL (DRAPES) IMPLANT
DRAPE LAPAROTOMY 100X72 PEDS (DRAPES) IMPLANT
ELECT REM PT RETURN 9FT ADLT (ELECTROSURGICAL) ×1
ELECTRODE REM PT RTRN 9FT ADLT (ELECTROSURGICAL) ×1 IMPLANT
GAUZE PAD ABD 8X10 STRL (GAUZE/BANDAGES/DRESSINGS) IMPLANT
GAUZE SPONGE 4X4 12PLY STRL (GAUZE/BANDAGES/DRESSINGS) IMPLANT
GLOVE BIO SURGEON STRL SZ 6 (GLOVE) ×1 IMPLANT
GLOVE INDICATOR 6.5 STRL GRN (GLOVE) ×1 IMPLANT
GOWN STRL REUS W/ TWL LRG LVL3 (GOWN DISPOSABLE) ×2 IMPLANT
GOWN STRL REUS W/TWL LRG LVL3 (GOWN DISPOSABLE) ×2
KIT BASIN OR (CUSTOM PROCEDURE TRAY) ×1 IMPLANT
KIT TURNOVER KIT B (KITS) ×1 IMPLANT
NS IRRIG 1000ML POUR BTL (IV SOLUTION) ×1 IMPLANT
PACK GENERAL/GYN (CUSTOM PROCEDURE TRAY) ×1 IMPLANT
PAD ARMBOARD 7.5X6 YLW CONV (MISCELLANEOUS) ×1 IMPLANT
PENCIL SMOKE EVACUATOR (MISCELLANEOUS) ×1 IMPLANT
SWAB COLLECTION DEVICE MRSA (MISCELLANEOUS) IMPLANT
SWAB CULTURE ESWAB REG 1ML (MISCELLANEOUS) IMPLANT
TOWEL GREEN STERILE (TOWEL DISPOSABLE) ×1 IMPLANT
TOWEL GREEN STERILE FF (TOWEL DISPOSABLE) ×1 IMPLANT

## 2022-12-21 NOTE — Interval H&P Note (Signed)
History and Physical Interval Note:  12/21/2022 11:13 AM  Sean Schaefer  has presented today for surgery, with the diagnosis of PERIANAL ABSCESS.  The various methods of treatment have been discussed with the patient and family. After consideration of risks, benefits and other options for treatment, the patient has consented to  Procedure(s): INCISION AND DRAINAGE ABSCESS (N/A) as a surgical intervention.  The patient's history has been reviewed, patient examined, no change in status, stable for surgery.  I have reviewed the patient's chart and labs.  Questions were answered to the patient's satisfaction.     Anaisha Mago Lollie Sails

## 2022-12-21 NOTE — Anesthesia Preprocedure Evaluation (Addendum)
Anesthesia Evaluation  Patient identified by MRN, date of birth, ID band Patient awake    Reviewed: Allergy & Precautions, NPO status , Patient's Chart, lab work & pertinent test results  History of Anesthesia Complications Negative for: history of anesthetic complications  Airway Mallampati: I  TM Distance: >3 FB Neck ROM: Full    Dental  (+) Teeth Intact, Dental Advisory Given   Pulmonary neg pulmonary ROS   breath sounds clear to auscultation       Cardiovascular hypertension, Pt. on medications and Pt. on home beta blockers (-) angina + CAD, + Past MI and + Cardiac Stents   Rhythm:Regular      ECHOCARDIOGRAM REPORT       Patient Name:   Sean Schaefer Date of Exam: 10/21/2022 Medical Rec #:  [id number]      Height:       70.0 in Accession #:    [id number]     Weight:       214.0 lb Date of Birth:  08/28/1965       BSA:          2.148 m Patient Age:    56 years       BP:           122/60 mmHg Patient Gender: M              HR:           97 bpm. Exam Location:  Inpatient  Procedure: 2D Echo, Cardiac Doppler, Color Doppler and Intracardiac            Opacification Agent  Indications:    I21.4 NSTEMI   History:        Patient has no prior history of Echocardiogram examinations.                 Previous Myocardial Infarction; Risk Factors:Non-Smoker,                 Hypertension, Diabetes and Dyslipidemia.   Sonographer:    Dondra Prader RVT RCS Referring Phys: 53 DAVID W [facility]    Sonographer Comments: Suboptimal apical window. IMPRESSIONS    1. Left ventricular ejection fraction, by estimation, is 70 to 75%. The left ventricle has hyperdynamic function. The left ventricle has no regional wall motion abnormalities. There is mild left ventricular hypertrophy. Indeterminate diastolic filling due to E-A fusion.  2. Right ventricular systolic function is normal. The right ventricular size is normal.  3. The mitral  valve is normal in structure. Trivial mitral valve regurgitation.  4. The aortic valve is tricuspid. Aortic valve regurgitation is not visualized. Aortic valve sclerosis/calcification is present, without any evidence of aortic stenosis.  5. Aortic dilatation noted. There is moderate dilatation of the ascending aorta, measuring 43 mm.  6. The inferior vena cava is dilated in size with <50% respiratory variability, suggesting right atrial pressure of 15 mmHg.      CULPRIT Prox-Mid Cx lesion is 99% stenosed-> 1st Mrg lesion is 100% stenosed, TIMI 0 flow. -(1 lesion in segment)   Post intervention, there is a 30% residual stenosis throughout the entire segment restoring TIMI-3 flow.->   --------------------------------------------------------------------   Ost LAD to Prox LAD lesion is 85% stenosed.   A drug-eluting stent was successfully placed using a Synergy XD 3.0 x 16-deployed to 3.3 mm. Post intervention, there is a 0% residual stenosis.   Mid LAD to Dist LAD lesion is 60% stenosed. Dist LAD-1 lesion is 60% stenosed. Dist LAD-2  lesion is 30% stenosed.   ---------------------------------------------------------------------   Prox RCA lesion is 25% stenosed.  Dist RCA lesion is 20% stenosed.  2nd PL from rPDA is 60% stenosed.   ---------------------------------------------------------------------   The left ventricular systolic function is normal.  The left ventricular ejection fraction is 55-65% by visual estimate.  LV end diastolic pressure is normal.   There is no aortic valve stenosis.     Neuro/Psych negative neurological ROS  negative psych ROS   GI/Hepatic PUD,,,  Endo/Other  diabetes, Type 2, Insulin Dependent  Lab Results      Component                Value               Date                      HGBA1C                   12.1 (H)            10/21/2022             Renal/GU Renal diseaseLab Results      Component                Value               Date                       CREATININE               1.54 (H)            12/21/2022                Musculoskeletal negative musculoskeletal ROS (+)    Abdominal   Peds  Hematology  (+) Blood dyscrasia, anemia Lab Results      Component                Value               Date                      WBC                      7.4                 12/21/2022                HGB                      9.9 (L)             12/21/2022                HCT                      29.3 (L)            12/21/2022                MCV                      97.3                12/21/2022                PLT  119 (L)             12/21/2022              Anesthesia Other Findings   Reproductive/Obstetrics                              Anesthesia Physical Anesthesia Plan  ASA: 3  Anesthesia Plan: General   Post-op Pain Management: Ofirmev IV (intra-op)*   Induction: Intravenous  PONV Risk Score and Plan: 3 and Ondansetron, Midazolam and Treatment may vary due to age or medical condition  Airway Management Planned: Oral ETT and LMA  Additional Equipment: None  Intra-op Plan:   Post-operative Plan: Extubation in OR  Informed Consent: I have reviewed the patients History and Physical, chart, labs and discussed the procedure including the risks, benefits and alternatives for the proposed anesthesia with the patient or authorized representative who has indicated his/her understanding and acceptance.     Dental advisory given  Plan Discussed with: CRNA  Anesthesia Plan Comments:         Anesthesia Quick Evaluation

## 2022-12-21 NOTE — Progress Notes (Addendum)
PROGRESS NOTE   Sean Schaefer  ZOX:[id number]    DOB: 03/13/1966    DOA: 12/20/2022  PCP: Renford Dills, MD   I have briefly reviewed patients previous medical records in [facility].  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  Admitted for large left-sided perianal abscess.  Per surgery, will need I&D, exam under anesthesia, timing to be determined.  Cardiology consulted by night TRH MD and general surgery given recent NSTEMI.   Assessment & Plan:  Principal Problem:   Abscess, perirectal Active Problems:   Crohn's disease (HCC)   Uncontrolled type 2 diabetes mellitus with hyperglycemia, with long-term current use of insulin (HCC)   AKI (acute kidney injury) (HCC)   Large left-sided perianal abscess, complicating complex Crohn's disease: History as noted above. CT A/P 5/31: Fluid collection extending from the left aspect of the rectum into the gluteal soft tissues, largest component measures at least 6.6 x 4.4 x 7 cm.  Suspected seton in place.  Component of the fluid collection extends along the seton in the left perineum extending to the base of the penis with small amount of gas.  S/p colectomy with ileal anal anastomosis. Continue IV fluids, IV Zosyn and multimodality pain control. General surgery input overnight appreciated, plan I&D, exam under anesthesia, timing to be determined and they plan to discuss with cardiology prior to  procedure regarding his DAPT regimen perioperatively.  For now continuing aspirin and Brilinta.  Acute urinary retention: By CT A/P 5/31: Bladder distended supraumbilically. Patient reports that once he was able to stand up, he voided without difficulty, large amounts. Check bladder scan to make sure he is not retaining further.  It is possible that he may get a Foley catheter during surgery. Enlarged prostate on CT,?  BPH,?  Consideration of tamsulosin  Acute kidney injury: Baseline creatinine probably in the 1, presented with creatinine of 1.47 which peaked to 1.75 and came down to 1.54.  Likely mostly due to acute urinary retention but may have had an element of hypovolemia from nausea and vomiting.   Address urinary retention as above.  Continue IV fluids. Trend daily BMP.  Avoid nephrotoxics.  Not sure if he is on ARB PTA (awaiting pharmacy home med rec review)  CAD s/p LAD stenting 10/20/2022: No anginal symptoms TTE 10/21/2022: LVEF 70 to 75% and no aortic stenosis. As per general surgery, for now continue DAPT pending cardiology input.  Continue carvedilol and rosuvastatin.  Poorly controlled type II DM/IDDM with hyperglycemia: A1c on 10/21/2022: 12.1. Continue Semglee 20 units nightly, SSI, mealtime NovoLog when able.  Adjust insulins as needed.  Hypertension Controlled on carvedilol, continue.  Hypokalemia Replace IV and follow.  Magnesium normal at 1.8.  Normocytic anemia Baseline hemoglobin probably in the 12 g range.  Presented with hemoglobin of 11.5 which has dropped to 9.9, suspect dilutional in the absence of overt large-volume bleeding.  Follow daily CBCs.  Thrombocytopenia: Appears mild, chronic and intermittent.  Follow CBCs daily.  Complex Crohn's disease: Outpatient follow-up with  Dr. Myrtie Neither, [facility] who plans multidisciplinary evaluation at an IBD referral center. Azathioprine currently on hold.  Body mass index is 28.15 kg/m./Overweight   ACP Documents:  None present DVT prophylaxis: enoxaparin (LOVENOX) injection 40 mg Start: 12/21/22 1000     Code Status: Full Code:  Family Communication: Spouse via phone. Disposition:  Status is: Inpatient Remains inpatient appropriate because: Large left perianal abscess that requires surgery.  IV antibiotics and IV fluids.     Consultants:   General surgery Cardiology-input pending.  Procedures:     Antimicrobials:   IV Zosyn.   Subjective:  Denies complaints.  States that he had 1 loose BM this morning without blood.  Denies rectal or abdominal pain.  No chest pain or dyspnea.  Objective:   Vitals:   12/20/22 2221 12/21/22 0438 12/21/22 0500 12/21/22 0756  BP: (!) 143/74 138/76  139/80  Pulse: (!) 107 (!) 108  (!) 109  Resp: 19 19  17   Temp: 99.3 F (37.4 C) 98.7 F (37.1 C)  99.5 F (37.5 C)  TempSrc: Oral Oral  Oral  SpO2: 100% 100%  99%  Weight:   89 kg   Height:        General exam: Young male, moderately built and nourished lying comfortably supine in bed without distress. Respiratory system: Clear to auscultation. Respiratory effort normal. Cardiovascular system: S1 & S2 heard, RRR. No JVD, murmurs, rubs, gallops or clicks. No pedal edema.  Telemetry personally reviewed: Sinus tachycardia in the low 100s. Gastrointestinal system: Abdomen is nondistended, soft and nontender. No organomegaly or masses felt. Normal bowel sounds heard.  Did not do rectal exam.  Noted exam by general surgery overnight. Central nervous system: Alert and oriented. No focal neurological deficits. Extremities: Symmetric 5 x 5 power. Skin: No rashes, lesions or ulcers Psychiatry: Judgement and insight appear normal. Mood & affect appropriate.     Data Reviewed:   I have personally reviewed following labs and imaging studies   CBC: Recent Labs  Lab 12/20/22 1023 12/20/22 1704 12/21/22 0048  WBC 9.8 8.4 7.4  NEUTROABS 8.3* 6.6  --   HGB 11.5* 11.7* 9.9*  HCT 34.1* 33.8* 29.3*  MCV  99.9 99.1 97.3  PLT 128.0* 143* 119*    Basic Metabolic Panel: Recent Labs  Lab 12/20/22 1023 12/20/22 1704 12/21/22 0047 12/21/22 0048  NA 133* 131*  --  132*  K 4.0 3.9  --  3.3*  CL 97 97*  --  100  CO2 22 23  --  20*  GLUCOSE 236* 302*  --  196*  BUN 14 13  --  11  CREATININE 1.47 1.75*  --  1.54*  CALCIUM 9.1 8.9  --  8.4*  MG  --   --  1.8  --     Liver Function Tests: Recent Labs  Lab 12/20/22 1023 12/20/22 1704  AST 11 15  ALT 9 14  ALKPHOS 48 51  BILITOT 1.7* 1.7*  PROT 7.9 7.8  ALBUMIN 3.6 3.3*    CBG: Recent Labs  Lab 12/20/22 2224 12/21/22 0407 12/21/22 0756  GLUCAP 218* 176* 148*    Microbiology Studies:   Recent Results (from the past 240 hour(s))  Surgical PCR screen     Status: None   Collection Time: 12/20/22 11:11 PM   Specimen: Nasal Mucosa; Nasal Swab  Result Value Ref Range Status   MRSA, PCR NEGATIVE NEGATIVE Final   Staphylococcus aureus NEGATIVE NEGATIVE Final    Comment: (NOTE) The Xpert  SA Assay (FDA approved for NASAL specimens in patients 60 years of age and older), is one component of a comprehensive surveillance program. It is not intended to diagnose infection nor to guide or monitor treatment. Performed at [facility], [address], [place], [place] [address]     Radiology Studies:  CT ABDOMEN PELVIS W CONTRAST  Result Date: 12/20/2022 CLINICAL DATA:  Anorectal abscess. EXAM: CT ABDOMEN AND PELVIS WITH CONTRAST TECHNIQUE: Multidetector CT imaging of the abdomen and pelvis was performed using the standard protocol following bolus administration of intravenous contrast. RADIATION DOSE REDUCTION: This exam was performed according to the departmental dose-optimization program which includes automated exposure control, adjustment of the mA and/or kV according to patient size and/or use of iterative reconstruction technique. CONTRAST:  70mL OMNIPAQUE IOHEXOL 350 MG/ML SOLN COMPARISON:  CT 12/09/2022 FINDINGS: Lower  chest: Bilateral gynecomastia. No acute basilar airspace disease or pleural effusion. Hepatobiliary: Layering gallstones. No gallbladder inflammation. Unremarkable appearance of the liver without focal liver lesion. No biliary dilatation. Pancreas: No ductal dilatation or inflammation. Spleen: Normal in size without focal abnormality. Adrenals/Urinary Tract: Normal adrenal glands. No hydronephrosis or perinephric edema. Homogeneous renal enhancement with symmetric excretion on delayed phase imaging. No renal stone or focal renal abnormality. Urinary bladder is distended extending superior to the umbilicus, volume = 900 cm^3. No definite bladder wall thickening. Stomach/Bowel: Subtotal colonic resection with ileo anal anastomosis. Stool within the ileal anal pouch with mild bowel wall thickening again seen. Fluid collection extending from the left aspect of the rectum extends into the gluteal soft tissues, multiloculated and irregular in shape. The largest component measures at least 6.6 x 4.4 x 7 cm. Suspected seton in place. There is adjacent subcutaneous stranding in the soft tissues. Component extends along the [place] in the left perineum extending to the base of the penis with small amount of gas. No inflammation or obstruction of small bowel. Minimal distal esophageal wall thickening. Unremarkable appearance of the stomach. Vascular/Lymphatic: Mild aorto bi-iliac atherosclerosis. Reactive appearing 12 mm left inguinal nodes. Reproductive: Large prostate causes mass effect on the bladder base. Prostate spans 0.3 cm transverse. Other: No intra-abdominopelvic fluid collection. Free intra-abdominal Musculoskeletal: Degenerative change in the spine, facet predominant. Bilateral hip degenerative change. There are no acute or suspicious osseous abnormalities. No intramuscular collection. IMPRESSION: 1. Fluid collection extending from the left aspect of the rectum into the gluteal soft tissues, largest component  measures at least 6.6 x 4.4 x 7 cm. Suspected seton in place. Component of the fluid collection extends along the seton in the left perineum extending to the base of the penis with small amount of gas. 2. Status post colectomy with ileal anal anastomosis. Mild wall thickening of the anorectal junction. 3. Distended urinary bladder extending superior to the umbilicus, recommend correlation for urinary retention. 4. Enlarged prostate causes mass effect on the bladder base. 5. Cholelithiasis without gallbladder inflammation. Aortic Atherosclerosis (ICD10-I70.0). Electronically Signed   By: Narda Rutherford M.D.   On: 12/20/2022 20:19    Scheduled Meds:    aspirin EC  81 mg Oral Daily   carvedilol  6.25 mg Oral BID WC   enoxaparin (LOVENOX) injection  40 mg Subcutaneous Q24H   insulin aspart  0-5 Units Subcutaneous QHS   insulin aspart  0-9 Units Subcutaneous TID WC   insulin aspart  8 Units Subcutaneous TID WC   insulin glargine-yfgn  20 Units Subcutaneous QHS   pantoprazole  40 mg Oral BID   rosuvastatin  40  mg Oral Daily   ticagrelor  90 mg Oral BID    Continuous Infusions:    sodium chloride 100 mL/hr at 12/20/22 2241   piperacillin-tazobactam (ZOSYN)  IV 3.375 g (12/21/22 0757)   potassium chloride 10 mEq (12/21/22 0807)     LOS: 1 day     Marcellus Scott, MD,  FACP, FHM, [facility], [facility], [facility]   [facility] [place]     To contact the attending provider between 7A-7P or the covering provider during after hours 7P-7A, please log into the web site [email or web address] and access using universal [place] password for that web site. If you do not have the password, please call the hospital operator.  12/21/2022, 9:38 AM

## 2022-12-21 NOTE — Anesthesia Postprocedure Evaluation (Signed)
Anesthesia Post Note  Patient: Sean Schaefer  Procedure(s) Performed: INCISION AND DRAINAGE ABSCESS     Patient location during evaluation: PACU Anesthesia Type: General Level of consciousness: awake and alert Pain management: pain level controlled Vital Signs Assessment: post-procedure vital signs reviewed and stable Respiratory status: spontaneous breathing, nonlabored ventilation and respiratory function stable Cardiovascular status: blood pressure returned to baseline and stable Postop Assessment: no apparent nausea or vomiting Anesthetic complications: no   No notable events documented.  Last Vitals:  Vitals:   12/21/22 1230 12/21/22 1245  BP: 101/71 127/66  Pulse: 100 98  Resp: 12 14  Temp:  37.1 C  SpO2: 98% 99%    Last Pain:  Vitals:   12/21/22 1245  TempSrc:   PainSc: 0-No pain                 Johathon Overturf

## 2022-12-21 NOTE — Anesthesia Procedure Notes (Signed)
Procedure Name: LMA Insertion Date/Time: 12/21/2022 11:28 AM  Performed by: Dairl Ponder, CRNAPre-anesthesia Checklist: Patient identified, Emergency Drugs available, Suction available and Patient being monitored Patient Re-evaluated:Patient Re-evaluated prior to induction Oxygen Delivery Method: Circle System Utilized Preoxygenation: Pre-oxygenation with 100% oxygen Induction Type: IV induction Ventilation: Mask ventilation without difficulty LMA: LMA inserted LMA Size: 4.0 Number of attempts: 1 Airway Equipment and Method: Bite block Placement Confirmation: positive ETCO2 Tube secured with: Tape Dental Injury: Teeth and Oropharynx as per pre-operative assessment

## 2022-12-21 NOTE — Transfer of Care (Signed)
Immediate Anesthesia Transfer of Care Note  Patient: Sean Schaefer  Procedure(s) Performed: INCISION AND DRAINAGE ABSCESS  Patient Location: PACU  Anesthesia Type:General  Level of Consciousness: awake, alert , and oriented  Airway & Oxygen Therapy: Patient Spontanous Breathing  Post-op Assessment: Report given to RN and Post -op Vital signs reviewed and stable  Post vital signs: Reviewed and stable  Last Vitals:  Vitals Value Taken Time  BP 119/75 12/21/22 1215  Temp    Pulse 99 12/21/22 1218  Resp 13 12/21/22 1218  SpO2 98 % 12/21/22 1218  Vitals shown include unvalidated device data.  Last Pain:  Vitals:   12/21/22 0756  TempSrc: Oral  PainSc:          Complications: No notable events documented.

## 2022-12-21 NOTE — Op Note (Signed)
Operative Note  BUCK ILGENFRITZ  [id number]  [id number]  12/21/2022   Surgeon: Phylliss Blakes MD FACS   Procedure performed: Incision and drainage of complex perianal abscess   Preop diagnosis: Recurrent perianal abscess Post-op diagnosis/intraop findings: Same   Specimens: no Retained items: Kerlix packing EBL: Minimal cc Complications: none   Description of procedure: After obtaining informed consent the patient was taken to the operating room and placed supine on operating room table where general endotracheal anesthesia was initiated, preoperative antibiotics were administered, SCDs applied, and a formal timeout was performed.  Patient was then repositioned in the dorsolithotomy position with all pressure points appropriately padded.  The perineum was prepped and draped with Betadine.  On initial examination there are 2 setons in the left perianal region and lateral to this is an area of fluctuance and erythema with a central draining area evacuating purulent fluid.  The area that was already draining was enlarged with cautery to create an approximately 1 cm wound.  Through this I was able to probe the deeper soft tissues and evacuated all appreciable abscess cavities.  There is some that extends slightly laterally and then some that extends anteriorly.  Hemostasis was ensured with cautery and the wound and abscess cavities were gently packed with 2 inch Kerlix followed by gauze fluffs and ABD.  The patient was then awakened, extubated and taken to PACU in stable condition.    All counts were correct at the completion of the case.

## 2022-12-21 NOTE — Progress Notes (Signed)
Rounding Note    Patient Name: Sean Schaefer Date of Encounter: 12/21/2022  [facility] Cardiologist: Bryan Lemma, MD   Subjective   This patient was seen by [place] on 5/20.  Now we are called back to comment on DAPT.  He is status post drainage of an abscess today.  He feels OK.  No pain.  No SOB.     Inpatient Medications    Scheduled Meds:  aspirin EC  81 mg Oral Daily   carvedilol  6.25 mg Oral BID WC   enoxaparin (LOVENOX) injection  40 mg Subcutaneous Q24H   insulin aspart  0-5 Units Subcutaneous QHS   insulin aspart  0-9 Units Subcutaneous TID WC   insulin aspart  8 Units Subcutaneous TID WC   insulin glargine-yfgn  20 Units Subcutaneous QHS   pantoprazole  40 mg Oral BID   rosuvastatin  40 mg Oral Daily   ticagrelor  90 mg Oral BID   Continuous Infusions:  sodium chloride 100 mL/hr at 12/20/22 2241   piperacillin-tazobactam (ZOSYN)  IV 3.375 g (12/21/22 1554)   potassium chloride 10 mEq (12/21/22 1550)   PRN Meds: acetaminophen **OR** acetaminophen, fentaNYL (SUBLIMAZE) injection, nitroGLYCERIN, ondansetron **OR** ondansetron (ZOFRAN) IV, oxyCODONE-acetaminophen   Vital Signs    Vitals:   12/21/22 1215 12/21/22 1230 12/21/22 1245 12/21/22 1308  BP: 119/75 101/71 127/66 131/66  Pulse: 100 100 98 97  Resp: 16 12 14 16   Temp:   98.8 F (37.1 C) 98.6 F (37 C)  TempSrc:    Oral  SpO2: 98% 98% 99% 100%  Weight:      Height:        Intake/Output Summary (Last 24 hours) at 12/21/2022 1625 Last data filed at 12/21/2022 1208 Gross per 24 hour  Intake 1218.74 ml  Output 15 ml  Net 1203.74 ml      12/21/2022   10:05 AM 12/21/2022    5:00 AM 12/20/2022    4:56 PM  Last 3 Weights  Weight (lbs) 196 lb 3.4 oz 196 lb 3.4 oz 195 lb  Weight (kg) 89 kg 89 kg 88.451 kg      Physical Exam   GEN: No acute distress.   Buttocks:  Wound with packing/dressing without frank bleeding  Labs    High Sensitivity Troponin:   Recent Labs  Lab 12/09/22 1730  12/09/22 1858 12/12/22 1500 12/12/22 1726  TROPONINIHS 9 8 13 14      Chemistry Recent Labs  Lab 12/20/22 1023 12/20/22 1704 12/21/22 0047 12/21/22 0048  NA 133* 131*  --  132*  K 4.0 3.9  --  3.3*  CL 97 97*  --  100  CO2 22 23  --  20*  GLUCOSE 236* 302*  --  196*  BUN 14 13  --  11  CREATININE 1.47 1.75*  --  1.54*  CALCIUM 9.1 8.9  --  8.4*  MG  --   --  1.8  --   PROT 7.9 7.8  --   --   ALBUMIN 3.6 3.3*  --   --   AST 11 15  --   --   ALT 9 14  --   --   ALKPHOS 48 51  --   --   BILITOT 1.7* 1.7*  --   --   GFRNONAA  --  46*  --  53*  ANIONGAP  --  11  --  12    Lipids No results for input(s): "CHOL", "  TRIG", "HDL", "LABVLDL", "LDLCALC", "CHOLHDL" in the last 168 hours.  Hematology Recent Labs  Lab 12/20/22 1023 12/20/22 1704 12/21/22 0048  WBC 9.8 8.4 7.4  RBC 3.42* 3.41* 3.01*  HGB 11.5* 11.7* 9.9*  HCT 34.1* 33.8* 29.3*  MCV 99.9 99.1 97.3  MCH  --  34.3* 32.9  MCHC 33.8 34.6 33.8  RDW 15.2 14.6 14.4  PLT 128.0* 143* 119*   Thyroid No results for input(s): "TSH", "FREET4" in the last 168 hours.  BNPNo results for input(s): "BNP", "PROBNP" in the last 168 hours.  DDimer No results for input(s): "DDIMER" in the last 168 hours.   Radiology    CT ABDOMEN PELVIS W CONTRAST  Result Date: 12/20/2022 CLINICAL DATA:  Anorectal abscess. EXAM: CT ABDOMEN AND PELVIS WITH CONTRAST TECHNIQUE: Multidetector CT imaging of the abdomen and pelvis was performed using the standard protocol following bolus administration of intravenous contrast. RADIATION DOSE REDUCTION: This exam was performed according to the departmental dose-optimization program which includes automated exposure control, adjustment of the mA and/or kV according to patient size and/or use of iterative reconstruction technique. CONTRAST:  70mL OMNIPAQUE IOHEXOL 350 MG/ML SOLN COMPARISON:  CT 12/09/2022 FINDINGS: Lower chest: Bilateral gynecomastia. No acute basilar airspace disease or pleural effusion.  Hepatobiliary: Layering gallstones. No gallbladder inflammation. Unremarkable appearance of the liver without focal liver lesion. No biliary dilatation. Pancreas: No ductal dilatation or inflammation. Spleen: Normal in size without focal abnormality. Adrenals/Urinary Tract: Normal adrenal glands. No hydronephrosis or perinephric edema. Homogeneous renal enhancement with symmetric excretion on delayed phase imaging. No renal stone or focal renal abnormality. Urinary bladder is distended extending superior to the umbilicus, volume = 900 cm^3. No definite bladder wall thickening. Stomach/Bowel: Subtotal colonic resection with ileo anal anastomosis. Stool within the ileal anal pouch with mild bowel wall thickening again seen. Fluid collection extending from the left aspect of the rectum extends into the gluteal soft tissues, multiloculated and irregular in shape. The largest component measures at least 6.6 x 4.4 x 7 cm. Suspected seton in place. There is adjacent subcutaneous stranding in the soft tissues. Component extends along the [place] in the left perineum extending to the base of the penis with small amount of gas. No inflammation or obstruction of small bowel. Minimal distal esophageal wall thickening. Unremarkable appearance of the stomach. Vascular/Lymphatic: Mild aorto bi-iliac atherosclerosis. Reactive appearing 12 mm left inguinal nodes. Reproductive: Large prostate causes mass effect on the bladder base. Prostate spans 0.3 cm transverse. Other: No intra-abdominopelvic fluid collection. Free intra-abdominal Musculoskeletal: Degenerative change in the spine, facet predominant. Bilateral hip degenerative change. There are no acute or suspicious osseous abnormalities. No intramuscular collection. IMPRESSION: 1. Fluid collection extending from the left aspect of the rectum into the gluteal soft tissues, largest component measures at least 6.6 x 4.4 x 7 cm. Suspected seton in place. Component of the fluid  collection extends along the seton in the left perineum extending to the base of the penis with small amount of gas. 2. Status post colectomy with ileal anal anastomosis. Mild wall thickening of the anorectal junction. 3. Distended urinary bladder extending superior to the umbilicus, recommend correlation for urinary retention. 4. Enlarged prostate causes mass effect on the bladder base. 5. Cholelithiasis without gallbladder inflammation. Aortic Atherosclerosis (ICD10-I70.0). Electronically Signed   By: Narda Rutherford M.D.   On: 12/20/2022 20:19    Cardiac Studies   NA  Patient Profile     57 y.o. male with a hx of CAD s/p NSTEMI, DES-pLAD, PTCA-pLCx, hypertension,  hyperlipidemia, Crohn's disease, dilation of the ascending aorta (43mm by echo 10/2022) and insulin-dependent type 2 diabetes who is being seen 12/10/2022 for the evaluation of chest pain at the request of Dr. Jerral Ralph.   Assessment & Plan    CAD:  Patient went to OR this morning for perianal abscess drainage.   His morning dose of Brilinta was held.  Given recent coronary intervention resume Brilinta.  I checked with surgical service and OK to give Brilinta tonight and resume ASA in the morning.       For questions or updates, please contact [facility] Please consult [email or web address] for contact info under        Signed, Rollene Rotunda, MD  12/21/2022, 4:25 PM

## 2022-12-22 ENCOUNTER — Encounter (HOSPITAL_COMMUNITY): Payer: Self-pay | Admitting: Surgery

## 2022-12-22 DIAGNOSIS — N179 Acute kidney failure, unspecified: Secondary | ICD-10-CM | POA: Diagnosis not present

## 2022-12-22 DIAGNOSIS — K611 Rectal abscess: Secondary | ICD-10-CM | POA: Diagnosis not present

## 2022-12-22 LAB — GLUCOSE, CAPILLARY
Glucose-Capillary: 117 mg/dL — ABNORMAL HIGH (ref 70–99)
Glucose-Capillary: 144 mg/dL — ABNORMAL HIGH (ref 70–99)
Glucose-Capillary: 167 mg/dL — ABNORMAL HIGH (ref 70–99)
Glucose-Capillary: 177 mg/dL — ABNORMAL HIGH (ref 70–99)
Glucose-Capillary: 182 mg/dL — ABNORMAL HIGH (ref 70–99)
Glucose-Capillary: 231 mg/dL — ABNORMAL HIGH (ref 70–99)

## 2022-12-22 LAB — BASIC METABOLIC PANEL
Anion gap: 11 (ref 5–15)
BUN: 16 mg/dL (ref 6–20)
CO2: 19 mmol/L — ABNORMAL LOW (ref 22–32)
Calcium: 8.1 mg/dL — ABNORMAL LOW (ref 8.9–10.3)
Chloride: 102 mmol/L (ref 98–111)
Creatinine, Ser: 1.77 mg/dL — ABNORMAL HIGH (ref 0.61–1.24)
GFR, Estimated: 45 mL/min — ABNORMAL LOW (ref >60)
Glucose, Bld: 223 mg/dL — ABNORMAL HIGH (ref 70–99)
Potassium: 4.2 mmol/L (ref 3.5–5.1)
Sodium: 132 mmol/L — ABNORMAL LOW (ref 135–145)

## 2022-12-22 LAB — CBC
HCT: 27.8 % — ABNORMAL LOW (ref 39.0–52.0)
Hemoglobin: 9.5 g/dL — ABNORMAL LOW (ref 13.0–17.0)
MCH: 33.2 pg (ref 26.0–34.0)
MCHC: 34.2 g/dL (ref 30.0–36.0)
MCV: 97.2 fL (ref 80.0–100.0)
Platelets: 143 10*3/uL — ABNORMAL LOW (ref 150–400)
RBC: 2.86 MIL/uL — ABNORMAL LOW (ref 4.22–5.81)
RDW: 14.2 % (ref 11.5–15.5)
WBC: 6.3 10*3/uL (ref 4.0–10.5)
nRBC: 0 % (ref 0.0–0.2)

## 2022-12-22 MED ORDER — MELATONIN 3 MG PO TABS
3.0000 mg | ORAL_TABLET | Freq: Every day | ORAL | Status: DC
  Administered 2022-12-22 – 2022-12-23 (×2): 3 mg via ORAL
  Filled 2022-12-22 (×2): qty 1

## 2022-12-22 NOTE — Progress Notes (Signed)
Assessment & Plan: POD#1 - status post incision and drainage of complex perianal abscess - 12/21/2022 Dr. Fredricka Bonine  Crohn's disease  Dressing/packing removed with minimal bleeding - dry dressing place, no packing  Continue IV abx's  Discussed with Dr. Waymon Amato and with nursing        Darnell Level, MD [facility] practice Office: [phone]        Chief Complaint: Left buttock complex abscess, Crohn's disease  Subjective: Patient in bed, comfortable.  Assists with dressing change.  Objective: Vital signs in last 24 hours: Temp:  [97.5 F (36.4 C)-99.5 F (37.5 C)] 97.5 F (36.4 C) (06/02 0517) Pulse Rate:  [79-106] 79 (06/02 0805) Resp:  [12-20] 17 (06/02 0805) BP: (101-165)/(66-95) 153/95 (06/02 0805) SpO2:  [93 %-100 %] 100 % (06/02 0805) Weight:  [89 kg-89.2 kg] 89.2 kg (06/02 0500) Last BM Date : 12/21/22  Intake/Output from previous day: 06/01 0701 - 06/02 0700 In: 1040 [P.O.:240; I.V.:800] Out: 615 [Urine:600; Blood:15] Intake/Output this shift: No intake/output data recorded.  Physical Exam: HEENT - sclerae clear, mucous membranes moist Perineum - packing removed from left buttock wound; minimal bleeding; dry dressing placed  Lab Results:  Recent Labs    12/21/22 0048 12/22/22 0102  WBC 7.4 6.3  HGB 9.9* 9.5*  HCT 29.3* 27.8*  PLT 119* 143*   BMET Recent Labs    12/21/22 0048 12/22/22 0102  NA 132* 132*  K 3.3* 4.2  CL 100 102  CO2 20* 19*  GLUCOSE 196* 223*  BUN 11 16  CREATININE 1.54* 1.77*  CALCIUM 8.4* 8.1*   PT/INR Recent Labs    12/20/22 1704  LABPROT 14.0  INR 1.1   Comprehensive Metabolic Panel:    Component Value Date/Time   NA 132 (L) 12/22/2022 0102   NA 132 (L) 12/21/2022 0048   NA 135 11/04/2022 0915   NA 139 08/24/2018 1750   K 4.2 12/22/2022 0102   K 3.3 (L) 12/21/2022 0048   CL 102 12/22/2022 0102   CL 100 12/21/2022 0048   CO2 19 (L) 12/22/2022 0102   CO2 20 (L) 12/21/2022 0048    BUN 16 12/22/2022 0102   BUN 11 12/21/2022 0048   BUN 21 11/04/2022 0915   BUN 14 08/24/2018 1750   CREATININE 1.77 (H) 12/22/2022 0102   CREATININE 1.54 (H) 12/21/2022 0048   CREATININE 1.25 01/08/2016 1611   CREATININE 0.82 02/15/2014 1736   GLUCOSE 223 (H) 12/22/2022 0102   GLUCOSE 196 (H) 12/21/2022 0048   CALCIUM 8.1 (L) 12/22/2022 0102   CALCIUM 8.4 (L) 12/21/2022 0048   AST 15 12/20/2022 1704   AST 11 12/20/2022 1023   ALT 14 12/20/2022 1704   ALT 9 12/20/2022 1023   ALKPHOS 51 12/20/2022 1704   ALKPHOS 48 12/20/2022 1023   BILITOT 1.7 (H) 12/20/2022 1704   BILITOT 1.7 (H) 12/20/2022 1023   BILITOT 0.8 12/17/2017 1018   BILITOT 0.8 12/06/2016 0843   PROT 7.8 12/20/2022 1704   PROT 7.9 12/20/2022 1023   PROT 7.6 12/17/2017 1018   PROT 7.7 12/06/2016 0843   ALBUMIN 3.3 (L) 12/20/2022 1704   ALBUMIN 3.6 12/20/2022 1023   ALBUMIN 3.8 12/17/2017 1018   ALBUMIN 3.9 12/06/2016 0843    Studies/Results: CT ABDOMEN PELVIS W CONTRAST  Result Date: 12/20/2022 CLINICAL DATA:  Anorectal abscess. EXAM: CT ABDOMEN AND PELVIS WITH CONTRAST TECHNIQUE: Multidetector CT imaging of the abdomen and pelvis was performed using the standard protocol following bolus  administration of intravenous contrast. RADIATION DOSE REDUCTION: This exam was performed according to the departmental dose-optimization program which includes automated exposure control, adjustment of the mA and/or kV according to patient size and/or use of iterative reconstruction technique. CONTRAST:  70mL OMNIPAQUE IOHEXOL 350 MG/ML SOLN COMPARISON:  CT 12/09/2022 FINDINGS: Lower chest: Bilateral gynecomastia. No acute basilar airspace disease or pleural effusion. Hepatobiliary: Layering gallstones. No gallbladder inflammation. Unremarkable appearance of the liver without focal liver lesion. No biliary dilatation. Pancreas: No ductal dilatation or inflammation. Spleen: Normal in size without focal abnormality. Adrenals/Urinary Tract:  Normal adrenal glands. No hydronephrosis or perinephric edema. Homogeneous renal enhancement with symmetric excretion on delayed phase imaging. No renal stone or focal renal abnormality. Urinary bladder is distended extending superior to the umbilicus, volume = 900 cm^3. No definite bladder wall thickening. Stomach/Bowel: Subtotal colonic resection with ileo anal anastomosis. Stool within the ileal anal pouch with mild bowel wall thickening again seen. Fluid collection extending from the left aspect of the rectum extends into the gluteal soft tissues, multiloculated and irregular in shape. The largest component measures at least 6.6 x 4.4 x 7 cm. Suspected seton in place. There is adjacent subcutaneous stranding in the soft tissues. Component extends along the [place] in the left perineum extending to the base of the penis with small amount of gas. No inflammation or obstruction of small bowel. Minimal distal esophageal wall thickening. Unremarkable appearance of the stomach. Vascular/Lymphatic: Mild aorto bi-iliac atherosclerosis. Reactive appearing 12 mm left inguinal nodes. Reproductive: Large prostate causes mass effect on the bladder base. Prostate spans 0.3 cm transverse. Other: No intra-abdominopelvic fluid collection. Free intra-abdominal Musculoskeletal: Degenerative change in the spine, facet predominant. Bilateral hip degenerative change. There are no acute or suspicious osseous abnormalities. No intramuscular collection. IMPRESSION: 1. Fluid collection extending from the left aspect of the rectum into the gluteal soft tissues, largest component measures at least 6.6 x 4.4 x 7 cm. Suspected seton in place. Component of the fluid collection extends along the seton in the left perineum extending to the base of the penis with small amount of gas. 2. Status post colectomy with ileal anal anastomosis. Mild wall thickening of the anorectal junction. 3. Distended urinary bladder extending superior to the  umbilicus, recommend correlation for urinary retention. 4. Enlarged prostate causes mass effect on the bladder base. 5. Cholelithiasis without gallbladder inflammation. Aortic Atherosclerosis (ICD10-I70.0). Electronically Signed   By: Narda Rutherford M.D.   On: 12/20/2022 20:19      Darnell Level 12/22/2022  Patient ID: Sean Schaefer, male   DOB: 06-10-1966, 57 y.o.   MRN: [id number]

## 2022-12-22 NOTE — Progress Notes (Signed)
PROGRESS NOTE   Sean Schaefer  ZOX:[id number]    DOB: 1966-01-16    DOA: 12/20/2022  PCP: Renford Dills, MD   I have briefly reviewed patients previous medical records in [facility].  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  Admitted for large left-sided perianal abscess.  S/p I&D of complex perineal abscess in OR on 12/21/2022 by Dr. Doylene Canard, CCS.   Assessment & Plan:  Principal Problem:   Abscess, perirectal Active Problems:   Crohn's disease (HCC)   Uncontrolled type 2 diabetes mellitus with hyperglycemia, with long-term current use of insulin (HCC)   AKI (acute kidney injury) (HCC)   Large left-sided perianal abscess, complicating complex Crohn's disease: History as noted above. CT A/P 5/31: Fluid collection extending from the left aspect of the rectum into the gluteal soft tissues, largest component measures at least 6.6 x 4.4 x 7 cm.  Suspected seton in place.  Component of the fluid collection extends along the seton in the left perineum extending to the base of the penis with small amount of gas.  S/p colectomy with ileal anal anastomosis. Continue IV fluids, IV Zosyn and multimodality pain control. S/p I&D of complex perineal abscess in OR on 12/21/2022 by Dr. Thresa Ross, CCS.  No interruption of DAPT for surgery. Discussed with Dr. Gerrit Friends, CCS.  Ongoing dressing changes.  Acute urinary retention: By CT A/P 5/31: Bladder  distended supraumbilically. Patient voiding without difficulty.  Bladder scan ordered yesterday and this morning but not done yet, discussed with RN to do now. Enlarged prostate on CT,?  BPH,?  Consideration of tamsulosin  Acute kidney injury: Baseline creatinine has fluctuated over the course of this year but suspect that it is still in the 0.9-1.1 range.  Since admission, patient's creatinine has gone up and down, now back up to 1.77.  No hypotension through this admission.  Suspect no acute urinary retention but checking bladder scan this morning.  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  Continue to support with IV fluids and trend daily BMP.  Avoid nephrotoxics.  Holding home Avapro.  CAD s/p LAD stenting 10/20/2022: No anginal symptoms TTE 10/21/2022: LVEF 70 to 75% and no aortic stenosis. As per general surgery, for now continue DAPT pending cardiology input.  Continue carvedilol and rosuvastatin.  Hold Jardiance until AKI resolves. Cardiology input appreciated.  No new recommendations.  Continuing Brilinta and aspirin.  Poorly controlled type II DM/IDDM with hyperglycemia: A1c on 10/21/2022: 12.1. At home appears to be on Tresiba 38 units daily, Humalog 8 units 3 times daily with meals. Continue Semglee 20 units nightly, SSI, mealtime NovoLog when able.  Adjust insulins as needed. CBGs since this morning 177, 167, continue current regimen.  Hypertension Controlled on carvedilol, continue.  Holding Avapro.  Hypokalemia Replaced.  Magnesium normal at 1.8.  Normocytic anemia Baseline hemoglobin probably in the 12 g range.  Presented with hemoglobin of 11.5 which has dropped to 9.9, suspect dilutional in the absence of overt large-volume bleeding.  Follow daily  CBCs.  Hemoglobin stable in the 9 g range.  Thrombocytopenia: Appears mild, chronic and intermittent.  Follow CBCs daily.  Stable.  Complex Crohn's disease: Outpatient follow-up with Dr. Milbert Coulter GI who  plans multidisciplinary evaluation at an IBD referral center. Azathioprine currently on hold.  Insomnia: Usually has difficulty sleeping at night.  Does not use sleeping aids.  Willing to try melatonin at bedtime as needed.  Body mass index is 28.22 kg/m./Overweight   ACP Documents: None present DVT prophylaxis: enoxaparin (LOVENOX) injection 40 mg Start: 12/21/22 1000     Code Status: Full Code:  Family Communication: None at bedside Disposition:  Status is: Inpatient Remains inpatient appropriate because: Large left perianal abscess that requires surgery.  IV antibiotics and IV fluids.     Consultants:   General surgery Cardiology  Procedures:     Antimicrobials:   IV Zosyn.   Subjective:  No abdominal pain or rectal pain reported.  Indicates difficulty sleeping and has not slept much last night.  Appears to have chronic insomnia issues.  Tolerated diet well yesterday from my [employer].  Objective:   Vitals:   12/21/22 2024 12/22/22 0500 12/22/22 0517 12/22/22 0805  BP: (!) 156/88  (!) 149/87 (!) 153/95  Pulse: 96  86 79  Resp: 20   17  Temp: 98.1 F (36.7 C)  (!) 97.5 F (36.4 C)   TempSrc: Oral  Oral   SpO2: 100%  100% 100%  Weight:  89.2 kg    Height:        General exam: Young male, moderately built and nourished lying comfortably supine in bed without distress. Respiratory system: Clear to auscultation.  No increased work of breathing. Cardiovascular system: S1 & S2 heard, RRR. No JVD, murmurs, rubs, gallops or clicks. No pedal edema.  Telemetry personally reviewed: Mild ST in the low 100s. Gastrointestinal system: Abdomen is nondistended, soft and nontender. No organomegaly or masses felt. Normal bowel sounds heard.   Central nervous system: Alert and oriented. No focal neurological deficits. Extremities: Symmetric 5 x 5 power. Skin: No rashes, lesions or ulcers Psychiatry: Judgement and insight appear normal. Mood & affect appropriate.     Data  Reviewed:   I have personally reviewed following labs and imaging studies   CBC: Recent Labs  Lab 12/20/22 1023 12/20/22 1704 12/21/22 0048 12/22/22 0102  WBC 9.8 8.4 7.4 6.3  NEUTROABS 8.3* 6.6  --   --   HGB 11.5* 11.7* 9.9* 9.5*  HCT 34.1* 33.8* 29.3* 27.8*  MCV 99.9 99.1 97.3 97.2  PLT 128.0* 143* 119* 143*    Basic Metabolic Panel: Recent Labs  Lab 12/20/22 1023 12/20/22 1704 12/21/22 0047 12/21/22 0048 12/22/22 0102  NA 133* 131*  --  132* 132*  K 4.0 3.9  --  3.3* 4.2  CL 97 97*  --  100 102  CO2 22 23  --  20* 19*  GLUCOSE 236* 302*  --  196* 223*  BUN 14 13  --  11 16  CREATININE 1.47 1.75*  --  1.54* 1.77*  CALCIUM 9.1 8.9  --  8.4* 8.1*  MG  --   --  1.8  --   --     Liver Function Tests: Recent Labs  Lab 12/20/22 1023 12/20/22 1704  AST 11 15  ALT 9 14  ALKPHOS 48 51  BILITOT 1.7* 1.7*  PROT 7.9 7.8  ALBUMIN 3.6 3.3*    CBG: Recent Labs  Lab 12/22/22 0018 12/22/22 0606 12/22/22  0806  GLUCAP 231* 177* 167*    Microbiology Studies:   Recent Results (from the past 240 hour(s))  Surgical PCR screen     Status: None   Collection Time: 12/20/22 11:11 PM   Specimen: Nasal Mucosa; Nasal Swab  Result Value Ref Range Status   MRSA, PCR NEGATIVE NEGATIVE Final   Staphylococcus aureus NEGATIVE NEGATIVE Final    Comment: (NOTE) The Xpert SA Assay (FDA approved for NASAL specimens in patients 61 years of age and older), is one component of a comprehensive surveillance program. It is not intended to diagnose infection nor to guide or monitor treatment. Performed at [facility], [address] [place], [place] [address]     Radiology Studies:  CT ABDOMEN PELVIS W CONTRAST  Result Date: 12/20/2022 CLINICAL DATA:  Anorectal abscess. EXAM: CT ABDOMEN AND PELVIS WITH CONTRAST TECHNIQUE: Multidetector CT imaging of the abdomen and pelvis was performed using the standard protocol following bolus administration of intravenous contrast.  RADIATION DOSE REDUCTION: This exam was performed according to the departmental dose-optimization program which includes automated exposure control, adjustment of the mA and/or kV according to patient size and/or use of iterative reconstruction technique. CONTRAST:  70mL OMNIPAQUE IOHEXOL 350 MG/ML SOLN COMPARISON:  CT 12/09/2022 FINDINGS: Lower chest: Bilateral gynecomastia. No acute basilar airspace disease or pleural effusion. Hepatobiliary: Layering gallstones. No gallbladder inflammation. Unremarkable appearance of the liver without focal liver lesion. No biliary dilatation. Pancreas: No ductal dilatation or inflammation. Spleen: Normal in size without focal abnormality. Adrenals/Urinary Tract: Normal adrenal glands. No hydronephrosis or perinephric edema. Homogeneous renal enhancement with symmetric excretion on delayed phase imaging. No renal stone or focal renal abnormality. Urinary bladder is distended extending superior to the umbilicus, volume = 900 cm^3. No definite bladder wall thickening. Stomach/Bowel: Subtotal colonic resection with ileo anal anastomosis. Stool within the ileal anal pouch with mild bowel wall thickening again seen. Fluid collection extending from the left aspect of the rectum extends into the gluteal soft tissues, multiloculated and irregular in shape. The largest component measures at least 6.6 x 4.4 x 7 cm. Suspected seton in place. There is adjacent subcutaneous stranding in the soft tissues. Component extends along the [place] in the left perineum extending to the base of the penis with small amount of gas. No inflammation or obstruction of small bowel. Minimal distal esophageal wall thickening. Unremarkable appearance of the stomach. Vascular/Lymphatic: Mild aorto bi-iliac atherosclerosis. Reactive appearing 12 mm left inguinal nodes. Reproductive: Large prostate causes mass effect on the bladder base. Prostate spans 0.3 cm transverse. Other: No intra-abdominopelvic fluid  collection. Free intra-abdominal Musculoskeletal: Degenerative change in the spine, facet predominant. Bilateral hip degenerative change. There are no acute or suspicious osseous abnormalities. No intramuscular collection. IMPRESSION: 1. Fluid collection extending from the left aspect of the rectum into the gluteal soft tissues, largest component measures at least 6.6 x 4.4 x 7 cm. Suspected seton in place. Component of the fluid collection extends along the seton in the left perineum extending to the base of the penis with small amount of gas. 2. Status post colectomy with ileal anal anastomosis. Mild wall thickening of the anorectal junction. 3. Distended urinary bladder extending superior to the umbilicus, recommend correlation for urinary retention. 4. Enlarged prostate causes mass effect on the bladder base. 5. Cholelithiasis without gallbladder inflammation. Aortic Atherosclerosis (ICD10-I70.0). Electronically Signed   By: Narda Rutherford M.D.   On: 12/20/2022 20:19    Scheduled Meds:    aspirin EC  81 mg Oral  Daily   carvedilol  6.25 mg Oral BID WC   enoxaparin (LOVENOX) injection  40 mg Subcutaneous Q24H   insulin aspart  0-5 Units Subcutaneous QHS   insulin aspart  0-9 Units Subcutaneous TID WC   insulin aspart  8 Units Subcutaneous TID WC   insulin glargine-yfgn  20 Units Subcutaneous QHS   pantoprazole  40 mg Oral BID   rosuvastatin  40 mg Oral Daily   ticagrelor  90 mg Oral BID    Continuous Infusions:    sodium chloride 100 mL/hr at 12/22/22 1023   piperacillin-tazobactam (ZOSYN)  IV 3.375 g (12/22/22 0601)     LOS: 2 days     Marcellus Scott, MD,  FACP, FHM, [facility], [facility], [facility]   [facility] [place]     To contact the attending provider between 7A-7P or the covering provider during after hours 7P-7A, please log into the web site [email or web address] and access using universal [place] password for that web site. If you do not have the  password, please call the hospital operator.  12/22/2022, 11:22 AM

## 2022-12-22 NOTE — Progress Notes (Signed)
The patient complained about his dose of Semglee,the patient was offered to let the nurse inform  the on-call ,but disagreed.The patient stated "go  ahead and give it'

## 2022-12-22 NOTE — Progress Notes (Signed)
Bladder scan revealed 220 ml. Provider notified

## 2022-12-23 ENCOUNTER — Encounter (HOSPITAL_COMMUNITY): Payer: 59

## 2022-12-23 DIAGNOSIS — N179 Acute kidney failure, unspecified: Secondary | ICD-10-CM | POA: Diagnosis not present

## 2022-12-23 DIAGNOSIS — K611 Rectal abscess: Secondary | ICD-10-CM | POA: Diagnosis not present

## 2022-12-23 LAB — BASIC METABOLIC PANEL
Anion gap: 13 (ref 5–15)
BUN: 17 mg/dL (ref 6–20)
CO2: 18 mmol/L — ABNORMAL LOW (ref 22–32)
Calcium: 7.9 mg/dL — ABNORMAL LOW (ref 8.9–10.3)
Chloride: 101 mmol/L (ref 98–111)
Creatinine, Ser: 1.44 mg/dL — ABNORMAL HIGH (ref 0.61–1.24)
GFR, Estimated: 58 mL/min — ABNORMAL LOW (ref >60)
Glucose, Bld: 125 mg/dL — ABNORMAL HIGH (ref 70–99)
Potassium: 4 mmol/L (ref 3.5–5.1)
Sodium: 132 mmol/L — ABNORMAL LOW (ref 135–145)

## 2022-12-23 LAB — CBC
HCT: 27.6 % — ABNORMAL LOW (ref 39.0–52.0)
Hemoglobin: 9.6 g/dL — ABNORMAL LOW (ref 13.0–17.0)
MCH: 34.2 pg — ABNORMAL HIGH (ref 26.0–34.0)
MCHC: 34.8 g/dL (ref 30.0–36.0)
MCV: 98.2 fL (ref 80.0–100.0)
Platelets: 172 10*3/uL (ref 150–400)
RBC: 2.81 MIL/uL — ABNORMAL LOW (ref 4.22–5.81)
RDW: 14.4 % (ref 11.5–15.5)
WBC: 8 10*3/uL (ref 4.0–10.5)
nRBC: 0 % (ref 0.0–0.2)

## 2022-12-23 LAB — GLUCOSE, CAPILLARY
Glucose-Capillary: 91 mg/dL (ref 70–99)
Glucose-Capillary: 144 mg/dL — ABNORMAL HIGH (ref 70–99)
Glucose-Capillary: 121 mg/dL — ABNORMAL HIGH (ref 70–99)
Glucose-Capillary: 143 mg/dL — ABNORMAL HIGH (ref 70–99)

## 2022-12-23 MED ORDER — LACTATED RINGERS IV SOLN: INTRAVENOUS | Status: AC

## 2022-12-23 NOTE — Plan of Care (Signed)

## 2022-12-23 NOTE — Progress Notes (Signed)
PROGRESS NOTE   Sean Schaefer  ZOX:[id number]    DOB: [birth year]    DOA: 12/20/2022  PCP: Renford Dills, MD   I have briefly reviewed patients previous medical records in [facility].  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  Admitted for large left-sided perianal abscess.  S/p I&D of complex perineal abscess in OR on 12/21/2022 by Dr. Doylene Canard, CCS. 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   Assessment & Plan:  Principal Problem:   Abscess, perirectal Active Problems:   Crohn's disease (HCC)   Uncontrolled type 2 diabetes mellitus with hyperglycemia, with long-term current use of insulin (HCC)   AKI (acute kidney injury) (HCC)   Large left-sided perianal abscess, complicating complex Crohn's disease: History as noted above. CT A/P 5/31: Fluid collection extending from the left aspect of the rectum into the gluteal soft tissues, largest component measures at least 6.6 x 4.4 x 7 cm.  Suspected seton in place.  Component of the fluid collection extends along the seton in the left perineum extending to the base of the penis with small amount of gas.  S/p colectomy with ileal anal anastomosis. Continue IV fluids, IV Zosyn and multimodality pain control. S/p I&D of complex perineal abscess in OR on 12/21/2022 by Dr. Thresa Ross, CCS.  No interruption of DAPT for surgery. General surgery follow-up appreciated, have cleared him for discharge  on total 10 days of antibiotics, Augmentin at discharge, patient familiar with wound care and sitz bath, outpatient follow-up with surgery in 1 to 2 weeks for wound check.  Acute urinary retention: By CT A/P 5/31: Bladder distended supraumbilically. Resolved.  Bladder scan to 20 mL yesterday.  Voiding without difficulty. Enlarged prostate on CT,?  BPH.  Acute kidney injury: Baseline creatinine has fluctuated over the course of this year but suspect that it is still in the 0.9-1.1 range.  Since admission, patient's creatinine has gone up and down, was back up to 1.77.  No hypotension through this admission.  Recurrent urinary retention ruled out.  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  Continue to support with IV fluids and trend daily BMP.  Avoid nephrotoxics.  Holding home Avapro.  Improving, continue gentle IV fluids for additional 24 hours.  CAD s/p LAD stenting 10/20/2022: No anginal symptoms TTE 10/21/2022: LVEF 70 to 75% and no aortic stenosis. As per general surgery, for now continue DAPT pending cardiology input.  Continue carvedilol and rosuvastatin.  Hold Jardiance until AKI resolves. Cardiology input appreciated.  No new recommendations.  Continuing Brilinta and aspirin.  Poorly controlled type II DM/IDDM with hyperglycemia: A1c on 10/21/2022: 12.1. At home appears to be on Tresiba 38 units daily, Humalog 8 units 3 times daily with meals. Continue Semglee 20 units nightly, SSI, mealtime NovoLog when able.  Adjust insulins as needed. Improved control.  No changes made today.  Hypertension Controlled on carvedilol, continue.  Holding Avapro.  Hypokalemia Replaced.  Magnesium normal at 1.8.  Normocytic anemia Baseline hemoglobin probably in the 12 g  range.  Presented with hemoglobin of 11.5 which has dropped to 9.9, suspect dilutional in the absence of overt large-volume bleeding.  Follow daily CBCs.  Hemoglobin stable in the 9 g range.  Thrombocytopenia: Appears  mild, chronic and intermittent.  Resolved.  Complex Crohn's disease: Outpatient follow-up with Dr. Milbert Coulter GI who plans multidisciplinary evaluation at an IBD referral center. Azathioprine currently on hold.  Insomnia: Usually has difficulty sleeping at night.  Does not use sleeping aids.  Reports that melatonin did not work.  Asking for something stronger.  Ordered trazodone as needed at bedtime.  Body mass index is 28.79 kg/m./Overweight   ACP Documents: None present DVT prophylaxis: enoxaparin (LOVENOX) injection 40 mg Start: 12/21/22 1000     Code Status: Full Code:  Family Communication: None at bedside Disposition:  AKI, ongoing IV fluids, hopeful DC in a.m. pending improvement in AKI.     Consultants:   General surgery Cardiology  Procedures:     Antimicrobials:   IV Zosyn.   Subjective:  Per nursing, had to change dressing multiple times due to copious drainage.  No pain reported by patient.  Urinating well.  Did not sleep again last night despite melatonin and asking for something stronger.  Objective:   Vitals:   12/23/22 0500 12/23/22 0530 12/23/22 0613 12/23/22 0738  BP:  (!) 159/90 (!) 166/88 (!) 175/80  Pulse:  85 76 64  Resp:  18  16  Temp:  97.7 F (36.5 C) (!) 97.5 F (36.4 C) 98 F (36.7 C)  TempSrc:  Oral Oral Oral  SpO2:  100% 100% 100%  Weight: 91 kg     Height:        General exam: Young male, moderately built and nourished lying comfortably supine in bed without distress.  Oral mucosa moist Respiratory system: Clear to auscultation.  No increased work of breathing. Cardiovascular system: S1 and S2 heard, RRR.  No JVD, murmurs or pedal edema. Gastrointestinal system: Abdomen is nondistended, soft and nontender. No organomegaly or masses felt. Normal bowel sounds heard. GU: Exam as per CCS, "wound clean with minimal drainage, induration improving". Central nervous system: Alert and oriented. No focal neurological  deficits. Extremities: Symmetric 5 x 5 power. Skin: No rashes, lesions or ulcers Psychiatry: Judgement and insight appear normal. Mood & affect appropriate.     Data Reviewed:   I have personally reviewed following labs and imaging studies   CBC: Recent Labs  Lab 12/20/22 1023 12/20/22 1704 12/21/22 0048 12/22/22 0102 12/23/22 0358  WBC 9.8 8.4 7.4 6.3 8.0  NEUTROABS 8.3* 6.6  --   --   --   HGB 11.5* 11.7* 9.9* 9.5* 9.6*  HCT 34.1* 33.8* 29.3* 27.8* 27.6*  MCV 99.9 99.1 97.3 97.2 98.2  PLT 128.0* 143* 119* 143* 172    Basic Metabolic Panel: Recent Labs  Lab 12/20/22 1023 12/20/22 1704 12/21/22 0047 12/21/22 0048 12/22/22 0102 12/23/22 0024  NA 133* 131*  --  132* 132* 132*  K 4.0 3.9  --  3.3* 4.2 4.0  CL 97 97*  --  100 102 101  CO2 22 23  --  20* 19* 18*  GLUCOSE 236* 302*  --  196* 223* 125*  BUN 14 13  --  11 16 17   CREATININE 1.47 1.75*  --  1.54* 1.77* 1.44*  CALCIUM 9.1 8.9  --  8.4* 8.1* 7.9*  MG  --   --  1.8  --   --   --  Liver Function Tests: Recent Labs  Lab 12/20/22 1023 12/20/22 1704  AST 11 15  ALT 9 14  ALKPHOS 48 51  BILITOT 1.7* 1.7*  PROT 7.9 7.8  ALBUMIN 3.6 3.3*    CBG: Recent Labs  Lab 12/22/22 1645 12/22/22 2120 12/23/22 0818  GLUCAP 144* 117* 91    Microbiology Studies:   Recent Results (from the past 240 hour(s))  Surgical PCR screen     Status: None   Collection Time: 12/20/22 11:11 PM   Specimen: Nasal Mucosa; Nasal Swab  Result Value Ref Range Status   MRSA, PCR NEGATIVE NEGATIVE Final   Staphylococcus aureus NEGATIVE NEGATIVE Final    Comment: (NOTE) The Xpert SA Assay (FDA approved for NASAL specimens in patients 36 years of age and older), is one component of a comprehensive surveillance program. It is not intended to diagnose infection nor to guide or monitor treatment. Performed at [facility], [address], [place], [place] [address]     Radiology Studies:  No results  found.  Scheduled Meds:    aspirin EC  81 mg Oral Daily   carvedilol  6.25 mg Oral BID WC   enoxaparin (LOVENOX) injection  40 mg Subcutaneous Q24H   insulin aspart  0-5 Units Subcutaneous QHS   insulin aspart  0-9 Units Subcutaneous TID WC   insulin aspart  8 Units Subcutaneous TID WC   insulin glargine-yfgn  20 Units Subcutaneous QHS   melatonin  3 mg Oral QHS   pantoprazole  40 mg Oral BID   rosuvastatin  40 mg Oral Daily   ticagrelor  90 mg Oral BID    Continuous Infusions:    sodium chloride 100 mL/hr at 12/22/22 2152   piperacillin-tazobactam (ZOSYN)  IV 3.375 g (12/23/22 0705)     LOS: 3 days     Marcellus Scott, MD,  FACP, FHM, [facility], [facility], [facility]   [facility] [place]     To contact the attending provider between 7A-7P or the covering provider during after hours 7P-7A, please log into the web site [email or web address] and access using universal [place] password for that web site. If you do not have the password, please call the hospital operator.  12/23/2022, 10:36 AM

## 2022-12-23 NOTE — Progress Notes (Signed)
Progress Note  2 Days Post-Op  Subjective: Pt reports some bloody drainage overnight but improving. Pain well controlled. Familiar with sitz baths vs using detachable shower head.   Objective: Vital signs in last 24 hours: Temp:  [97.5 F (36.4 C)-98 F (36.7 C)] 98 F (36.7 C) (06/03 0738) Pulse Rate:  [64-89] 64 (06/03 0738) Resp:  [16-18] 16 (06/03 0738) BP: (109-175)/(39-90) 175/80 (06/03 0738) SpO2:  [100 %] 100 % (06/03 0738) Weight:  [91 kg] 91 kg (06/03 0500) Last BM Date : 12/22/22  Intake/Output from previous day: 06/02 0701 - 06/03 0700 In: 460 [P.O.:460] Out: 800 [Urine:800] Intake/Output this shift: No intake/output data recorded.  PE: General: pleasant, WD, WN male who is laying in bed in NAD Lungs: Respiratory effort nonlabored Abd: soft, NT, ND GU: wound clean with minimal drainage, induration improving  Psych: A&Ox3 with an appropriate affect.    Lab Results:  Recent Labs    12/22/22 0102 12/23/22 0358  WBC 6.3 8.0  HGB 9.5* 9.6*  HCT 27.8* 27.6*  PLT 143* 172   BMET Recent Labs    12/22/22 0102 12/23/22 0024  NA 132* 132*  K 4.2 4.0  CL 102 101  CO2 19* 18*  GLUCOSE 223* 125*  BUN 16 17  CREATININE 1.77* 1.44*  CALCIUM 8.1* 7.9*   PT/INR Recent Labs    12/20/22 1704  LABPROT 14.0  INR 1.1   CMP     Component Value Date/Time   NA 132 (L) 12/23/2022 0024   NA 135 11/04/2022 0915   K 4.0 12/23/2022 0024   CL 101 12/23/2022 0024   CO2 18 (L) 12/23/2022 0024   GLUCOSE 125 (H) 12/23/2022 0024   BUN 17 12/23/2022 0024   BUN 21 11/04/2022 0915   CREATININE 1.44 (H) 12/23/2022 0024   CREATININE 1.25 01/08/2016 1611   CALCIUM 7.9 (L) 12/23/2022 0024   PROT 7.8 12/20/2022 1704   PROT 7.6 12/17/2017 1018   ALBUMIN 3.3 (L) 12/20/2022 1704   ALBUMIN 3.8 12/17/2017 1018   AST 15 12/20/2022 1704   ALT 14 12/20/2022 1704   ALKPHOS 51 12/20/2022 1704   BILITOT 1.7 (H) 12/20/2022 1704   BILITOT 0.8 12/17/2017 1018   GFRNONAA  58 (L) 12/23/2022 0024   GFRNONAA 68 01/08/2016 1611   GFRAA 105 08/24/2018 1750   GFRAA 79 01/08/2016 1611   Lipase     Component Value Date/Time   LIPASE 40 12/09/2022 1858       Studies/Results: No results found.  Anti-infectives: Anti-infectives (From admission, onward)    Start     Dose/Rate Route Frequency Ordered Stop   12/21/22 0700  piperacillin-tazobactam (ZOSYN) IVPB 3.375 g        3.375 g 12.5 mL/hr over 240 Minutes Intravenous Every 8 hours 12/20/22 2220     12/20/22 2200  metroNIDAZOLE (FLAGYL) IVPB 500 mg  Status:  Discontinued        500 mg 100 mL/hr over 60 Minutes Intravenous Every 12 hours 12/20/22 2145 12/20/22 2220   12/20/22 2200  piperacillin-tazobactam (ZOSYN) IVPB 3.375 g        3.375 g 100 mL/hr over 30 Minutes Intravenous  Once 12/20/22 2147 12/20/22 2323        Assessment/Plan  POD2 s/p I&D complex perianal abscess 12/21/22 Dr. Fredricka Bonine - packing out and bloody drainage improved - patient familiar with wound care and sitz - follow up  in 1-2 weeks for wound check - recommend total course 10 days of  abx - ok to DC on PO augmentin  - stable for DC from surgery standpoint   FEN: reg diet, IVF per [facility] VTE: brilinta, LMWH ID: [email or web address]>>   LOS: 3 days     Juliet Rude, [facility] 12/23/2022, 10:08 AM Please see Amion for pager number during day hours 7:00am-4:30pm

## 2022-12-24 ENCOUNTER — Other Ambulatory Visit (HOSPITAL_COMMUNITY): Payer: Self-pay

## 2022-12-24 ENCOUNTER — Telehealth: Payer: Self-pay | Admitting: Gastroenterology

## 2022-12-24 DIAGNOSIS — K611 Rectal abscess: Secondary | ICD-10-CM | POA: Diagnosis not present

## 2022-12-24 LAB — COMPREHENSIVE METABOLIC PANEL
ALT: 12 U/L (ref 0–44)
AST: 14 U/L — ABNORMAL LOW (ref 15–41)
Albumin: 2.1 g/dL — ABNORMAL LOW (ref 3.5–5.0)
Alkaline Phosphatase: 33 U/L — ABNORMAL LOW (ref 38–126)
Anion gap: 7 (ref 5–15)
BUN: 13 mg/dL (ref 6–20)
CO2: 23 mmol/L (ref 22–32)
Calcium: 8.1 mg/dL — ABNORMAL LOW (ref 8.9–10.3)
Chloride: 106 mmol/L (ref 98–111)
Creatinine, Ser: 1.5 mg/dL — ABNORMAL HIGH (ref 0.61–1.24)
GFR, Estimated: 55 mL/min — ABNORMAL LOW (ref >60)
Glucose, Bld: 152 mg/dL — ABNORMAL HIGH (ref 70–99)
Potassium: 3.5 mmol/L (ref 3.5–5.1)
Sodium: 136 mmol/L (ref 135–145)
Total Bilirubin: 0.8 mg/dL (ref 0.3–1.2)
Total Protein: 5.7 g/dL — ABNORMAL LOW (ref 6.5–8.1)

## 2022-12-24 LAB — THIOPURINE METABOLITES
6 MMP(6-Methylmercaptopurine): 2529 pmol/8x10(8)RBC (ref <5700)
6 TG(6-Thioguanine): 170 pmol/8x10(8)RBC — ABNORMAL LOW (ref 235–400)

## 2022-12-24 LAB — GLUCOSE, CAPILLARY
Glucose-Capillary: 135 mg/dL — ABNORMAL HIGH (ref 70–99)
Glucose-Capillary: 111 mg/dL — ABNORMAL HIGH (ref 70–99)

## 2022-12-24 MED ORDER — CARVEDILOL 12.5 MG PO TABS: 12.5000 mg | ORAL_TABLET | Freq: Two times a day (BID) | ORAL | Status: DC

## 2022-12-24 MED ORDER — AMOXICILLIN-POT CLAVULANATE 875-125 MG PO TABS
1.0000 | ORAL_TABLET | Freq: Two times a day (BID) | ORAL | 0 refills | Status: AC
  Filled 2022-12-24: qty 14, 7d supply, fill #0

## 2022-12-24 MED ORDER — TRESIBA FLEXTOUCH 100 UNIT/ML ~~LOC~~ SOPN: 38.0000 [IU] | PEN_INJECTOR | Freq: Every day | SUBCUTANEOUS | Status: DC

## 2022-12-24 NOTE — Progress Notes (Signed)
Pharmacy Antibiotic Note  Sean Schaefer is a 57 y.o. male admitted on 12/20/2022 with  wound infection .  Pharmacy has been consulted for Zosyn dosing. SCr 1.75 on presentation.  Plan: Zosyn 3.375g IV q8h (4h infusion) Planning to transition at DC to Augmentin for total duration 10 days Monitor clinical progress, c/s, renal function   Height: 5\' 10"  (177.8 cm) Weight: 91.1 kg (200 lb 13.4 oz) IBW/kg (Calculated) : 73  Temp (24hrs), Avg:97.8 F (36.6 C), Min:97.5 F (36.4 C), Max:98 F (36.7 C)  Recent Labs  Lab 12/20/22 1023 12/20/22 1704 12/21/22 0048 12/22/22 0102 12/23/22 0024 12/23/22 0358 12/24/22 0104  WBC 9.8 8.4 7.4 6.3  --  8.0  --   CREATININE 1.47 1.75* 1.54* 1.77* 1.44*  --  1.50*    Estimated Creatinine Clearance: 63.9 mL/min (A) (by C-G formula based on SCr of 1.5 mg/dL (H)).    Allergies  Allergen Reactions   Statins Other (See Comments)    Myalgias  Currently taking Crestor 10mg .    Thank you for allowing pharmacy to be a part of this patient's care.  Thelma Barge, PharmD Clinical Pharmacist

## 2022-12-24 NOTE — Plan of Care (Signed)

## 2022-12-24 NOTE — Discharge Summary (Signed)
Physician Discharge Summary  Sean Schaefer ZOX:[id number] DOB: [birth year]  PCP: Renford Dills, MD  Admitted from: Home Discharged to: Home  Admit date: 12/20/2022 Discharge date: 12/24/2022  Recommendations for Outpatient Follow-up:    Follow-up Information     Maczis, Hedda Slade, [place]. Go on 01/09/2023.   Specialty: General Surgery Why: 3:15 PM for wound check. Please arrive 30 min prior to appointment to check in. Contact information: [address] [place] [address] [facility] [place] [address] [phone]         Renford Dills, MD. Schedule an appointment as soon as possible for a visit in 1 week(s).   Specialty: Internal Medicine Why: To be seen with repeat labs (CBC & BMP). Contact information: [address] [place] [address] [phone]         Sherrilyn Rist, MD. Schedule an appointment as soon as possible for a visit.   Specialty: Gastroenterology Contact information: [address] [place] [address] [phone]                  Home Health: None    Equipment/Devices: None    Discharge Condition: Improved and stable   Code Status: Full Code Diet recommendation:  Discharge Diet Orders (From admission, onward)     Start     Ordered   12/24/22 0000  Diet - low sodium heart healthy        12/24/22 1306   12/24/22 0000  Diet Carb Modified        12/24/22 1306             Discharge Diagnoses:  Principal Problem:   Abscess, perirectal Active Problems:   Crohn's disease (HCC)   Uncontrolled type 2 diabetes mellitus with hyperglycemia, with long-term current use of insulin (HCC)   AKI (acute kidney injury) (HCC)   Brief Summary: 57 year old married male, medical history significant for complex postsurgical fistulizing perianal Crohn's disease on Remicade and azathioprine, recurrent perianal abscesses requiring multiple I&D's, 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  Admitted for large left-sided perianal abscess.  S/p I&D of complex perineal abscess in OR on 12/21/2022 by Dr. Doylene Canard, CCS. 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     Assessment & Plan:   Large left-sided perianal abscess, complicating complex Crohn's disease: History as noted above. CT A/P 5/31: Fluid collection extending from the left aspect of the rectum into the gluteal soft tissues, largest component measures at least 6.6 x 4.4 x 7 cm.  Suspected seton in place.  Component of the fluid collection extends along the seton in the left perineum extending to the base of the penis with small amount of gas.  S/p colectomy with ileal anal anastomosis. Treated with IV fluids, IV Zosyn and multimodality pain control. S/p I&D of complex perineal abscess in OR on 12/21/2022 by Dr. Thresa Ross, CCS.  No interruption of DAPT for surgery. General surgery signed off 6/3 appreciated, have cleared him for discharge on total 10 days of antibiotics, Augmentin at discharge, patient familiar with wound care and sitz bath, outpatient follow-up with surgery in 1 to 2 weeks for wound check.  They have made a follow-up appointment.   Acute  urinary retention: By CT A/P 5/31: Bladder distended supraumbilically. Resolved and voiding without difficulty. Enlarged prostate on CT,?  BPH.   Acute kidney injury: Baseline creatinine has fluctuated over the course of this year but suspect that it is still in the 0.9-1.1 range.  Since admission, patient's creatinine had gone up and down, was back up to 1.77.  No hypotension through this admission.  Recurrent urinary retention ruled out.  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  Avapro  held.  Treated with IV fluids, creatinine improved but over the last 2 days has plateaued in the 1.4-1.5 range.  On day of discharge, reviewed case with and discussed with Nephrology, Dr. Ronalee Belts who agrees that his AKI likely due to CIN and urinary retention, creatinine has been stable over the last 2 days, expects improvement to his baseline in the next few days to weeks, recommended holding ARB and Jardiance until AKI has resolved and can be discharged with close outpatient follow-up.  As per recommendation follow-up BMP in 1 week and if AKI not improved or resolved, patient can be referred to [facility] for further evaluation and management.  Patient has been extensively counseled regarding maintaining adequate hydration and avoiding NSAIDs.   CAD, NSTEMI, s/p LAD stenting 10/20/2022: No anginal symptoms TTE 10/21/2022: LVEF 70 to 75% and no aortic stenosis. DAPT was not held for surgery.   Continue carvedilol and rosuvastatin.   Hold Jardiance and ARB until AKI resolves.  Patient to follow-up closely with PCP regarding this. Cardiology input appreciated.  No new recommendations.  Continuing Brilinta and aspirin.   Poorly controlled type II DM/IDDM with hyperglycemia: A1c on 10/21/2022: 12.1. At home appears to be on Tresiba 38 units daily, Humalog 8 units 3 times daily with meals. Continue same regimen at discharge.   Hypertension Avapro held due to AKI.  Patient was getting less than prior home dose of carvedilol.  He was getting carvedilol 6.25 Mg twice daily at home he was on carvedilol 6.25 Mg in the mornings and 12.5 Mg in the evenings.  Resume home carvedilol dose at discharge.  Hopefully with this higher dose, his mildly uncontrolled hypertension will improve.  If not, may have to add another agent i.e. amlodipine if his renal functions do not improve an ARB cannot be resumed.   Hypokalemia Replaced.  Magnesium normal at 1.8.   Normocytic anemia Baseline hemoglobin  probably in the 12 g range.  Presented with hemoglobin of 11.5 which has dropped to 9.9, suspect dilutional in the absence of overt large-volume bleeding.  Follow daily CBCs.  Hemoglobin stable in the 9 g range.   Thrombocytopenia: Appears mild, chronic and intermittent.  Resolved.   Complex Crohn's disease: Outpatient follow-up with Dr. Milbert Coulter GI who plans multidisciplinary evaluation at an IBD referral center. Azathioprine currently on hold.   Insomnia: Patient reports that he slept well last night.   Body mass index is 28.79 kg/m./Overweight      Consultants:   General surgery Cardiology   Procedures:   As noted above.   Discharge Instructions  Discharge Instructions     Activity as tolerated - No restrictions   Complete by: As directed    Call MD for:  difficulty breathing, headache or visual disturbances   Complete by: As directed    Call MD for:  extreme fatigue   Complete by: As directed    Call MD for:  persistant dizziness or light-headedness   Complete by: As directed  Call MD for:  persistant nausea and vomiting   Complete by: As directed    Call MD for:  redness, tenderness, or signs of infection (pain, swelling, redness, odor or green/yellow discharge around incision site)   Complete by: As directed    Call MD for:  severe uncontrolled pain   Complete by: As directed    Call MD for:  temperature >100.4   Complete by: As directed    Diet - low sodium heart healthy   Complete by: As directed    Diet Carb Modified   Complete by: As directed         Medication List     STOP taking these medications    azaTHIOprine 50 MG tablet Commonly known as: IMURAN   ciprofloxacin 500 MG tablet Commonly known as: CIPRO   irbesartan 150 MG tablet Commonly known as: AVAPRO   Jardiance 10 MG Tabs tablet Generic drug: empagliflozin   metroNIDAZOLE 500 MG tablet Commonly known as: Flagyl       TAKE these medications     amoxicillin-clavulanate 875-125 MG tablet Commonly known as: AUGMENTIN Take 1 tablet by mouth 2 (two) times daily for 7 days.   aspirin EC 81 MG tablet Take 1 tablet (81 mg total) by mouth daily. Swallow whole.   Brilinta 90 MG Tabs tablet Generic drug: ticagrelor Take 1 tablet (90 mg) by mouth 2 times daily.   carvedilol 12.5 MG tablet Commonly known as: COREG Take 6.25 mg ( 1/2 tablet of 12.5 mg)  in the morning and 12.5 mg  in the evening   FreeStyle Libre 14 Day Sensor Misc Apply 1 sensor to the back of upper arm every 14 days   insulin lispro 100 UNIT/ML injection Commonly known as: HUMALOG Inject 8 Units into the skin 3 (three) times daily with meals.   nitroGLYCERIN 0.4 MG SL tablet Commonly known as: NITROSTAT Place 1 tablet (0.4 mg total) under the tongue every 5 (five) minutes as needed for chest pain.   ondansetron 4 MG disintegrating tablet Commonly known as: ZOFRAN-ODT Dissolve 1 tablet (4 mg total) by mouth every 8 (eight) hours as needed for nausea or vomiting.   oxyCODONE-acetaminophen 5-325 MG tablet Commonly known as: PERCOCET/ROXICET Take 1-2 tablets by mouth every 6 (six) hours as needed for up to 7 days for severe pain.   pantoprazole 40 MG tablet Commonly known as: Protonix Take 1 tablet (40 mg total) by mouth 2 (two) times daily.   rosuvastatin 40 MG tablet Commonly known as: Crestor Take 1 tablet (40 mg total) by mouth daily.   [name] FlexTouch 100 UNIT/ML FlexTouch Pen Generic drug: insulin degludec Inject 38 Units into the skin daily. Start taking on: December 25, 2022 What changed:  how much to take when to take this       Allergies  Allergen Reactions   Statins Other (See Comments)    Myalgias  Currently taking Crestor 10mg .      Procedures/Studies: CT ABDOMEN PELVIS W CONTRAST  Result Date: 12/20/2022 CLINICAL DATA:  Anorectal abscess. EXAM: CT ABDOMEN AND PELVIS WITH CONTRAST TECHNIQUE: Multidetector CT imaging of the  abdomen and pelvis was performed using the standard protocol following bolus administration of intravenous contrast. RADIATION DOSE REDUCTION: This exam was performed according to the departmental dose-optimization program which includes automated exposure control, adjustment of the mA and/or kV according to patient size and/or use of iterative reconstruction technique. CONTRAST:  70mL OMNIPAQUE IOHEXOL 350 MG/ML SOLN COMPARISON:  CT 12/09/2022 FINDINGS: Lower  chest: Bilateral gynecomastia. No acute basilar airspace disease or pleural effusion. Hepatobiliary: Layering gallstones. No gallbladder inflammation. Unremarkable appearance of the liver without focal liver lesion. No biliary dilatation. Pancreas: No ductal dilatation or inflammation. Spleen: Normal in size without focal abnormality. Adrenals/Urinary Tract: Normal adrenal glands. No hydronephrosis or perinephric edema. Homogeneous renal enhancement with symmetric excretion on delayed phase imaging. No renal stone or focal renal abnormality. Urinary bladder is distended extending superior to the umbilicus, volume = 900 cm^3. No definite bladder wall thickening. Stomach/Bowel: Subtotal colonic resection with ileo anal anastomosis. Stool within the ileal anal pouch with mild bowel wall thickening again seen. Fluid collection extending from the left aspect of the rectum extends into the gluteal soft tissues, multiloculated and irregular in shape. The largest component measures at least 6.6 x 4.4 x 7 cm. Suspected seton in place. There is adjacent subcutaneous stranding in the soft tissues. Component extends along the [place] in the left perineum extending to the base of the penis with small amount of gas. No inflammation or obstruction of small bowel. Minimal distal esophageal wall thickening. Unremarkable appearance of the stomach. Vascular/Lymphatic: Mild aorto bi-iliac atherosclerosis. Reactive appearing 12 mm left inguinal nodes. Reproductive: Large prostate  causes mass effect on the bladder base. Prostate spans 0.3 cm transverse. Other: No intra-abdominopelvic fluid collection. Free intra-abdominal Musculoskeletal: Degenerative change in the spine, facet predominant. Bilateral hip degenerative change. There are no acute or suspicious osseous abnormalities. No intramuscular collection. IMPRESSION: 1. Fluid collection extending from the left aspect of the rectum into the gluteal soft tissues, largest component measures at least 6.6 x 4.4 x 7 cm. Suspected seton in place. Component of the fluid collection extends along the seton in the left perineum extending to the base of the penis with small amount of gas. 2. Status post colectomy with ileal anal anastomosis. Mild wall thickening of the anorectal junction. 3. Distended urinary bladder extending superior to the umbilicus, recommend correlation for urinary retention. 4. Enlarged prostate causes mass effect on the bladder base. 5. Cholelithiasis without gallbladder inflammation. Aortic Atherosclerosis (ICD10-I70.0). Electronically Signed   By: Narda Rutherford M.D.   On: 12/20/2022 20:19     Subjective: Denies complaints.  No pain reported.  Urinating well.  States that he has not been drinking enough fluids and plans to do so.  Has been ambulating to the bathroom and back without difficulty.  Discharge Exam:  Vitals:   12/24/22 0500 12/24/22 0508 12/24/22 0621 12/24/22 0800  BP:  (!) 173/88 (!) 182/85 (!) 185/89  Pulse:  82 75 79  Resp:  17  17  Temp:  (!) 97.5 F (36.4 C)  97.9 F (36.6 C)  TempSrc:  Oral  Oral  SpO2:  99%  100%  Weight: 91.1 kg     Height:        General exam: Young male, moderately built and nourished lying comfortably supine in bed without distress.  Oral mucosa moist Respiratory system: Clear to auscultation.  No increased work of breathing. Cardiovascular system: S1 and S2 heard, RRR.  No JVD, murmurs or pedal edema. Gastrointestinal system: Abdomen is nondistended, soft  and nontender. No organomegaly or masses felt. Normal bowel sounds heard. GU: Exam as per CCS, "wound clean with minimal drainage, induration improving". Central nervous system: Alert and oriented. No focal neurological deficits. Extremities: Symmetric 5 x 5 power. Skin: No rashes, lesions or ulcers Psychiatry: Judgement and insight appear normal. Mood & affect appropriate.     The results of significant diagnostics  from this hospitalization (including imaging, microbiology, ancillary and laboratory) are listed below for reference.     Microbiology: Recent Results (from the past 240 hour(s))  Surgical PCR screen     Status: None   Collection Time: 12/20/22 11:11 PM   Specimen: Nasal Mucosa; Nasal Swab  Result Value Ref Range Status   MRSA, PCR NEGATIVE NEGATIVE Final   Staphylococcus aureus NEGATIVE NEGATIVE Final    Comment: (NOTE) The Xpert SA Assay (FDA approved for NASAL specimens in patients 61 years of age and older), is one component of a comprehensive surveillance program. It is not intended to diagnose infection nor to guide or monitor treatment. Performed at [facility], [address], [place], [place] [address]      Labs: CBC: Recent Labs  Lab 12/20/22 1023 12/20/22 1704 12/21/22 0048 12/22/22 0102 12/23/22 0358  WBC 9.8 8.4 7.4 6.3 8.0  NEUTROABS 8.3* 6.6  --   --   --   HGB 11.5* 11.7* 9.9* 9.5* 9.6*  HCT 34.1* 33.8* 29.3* 27.8* 27.6*  MCV 99.9 99.1 97.3 97.2 98.2  PLT 128.0* 143* 119* 143* 172    Basic Metabolic Panel: Recent Labs  Lab 12/20/22 1704 12/21/22 0047 12/21/22 0048 12/22/22 0102 12/23/22 0024 12/24/22 0104  NA 131*  --  132* 132* 132* 136  K 3.9  --  3.3* 4.2 4.0 3.5  CL 97*  --  100 102 101 106  CO2 23  --  20* 19* 18* 23  GLUCOSE 302*  --  196* 223* 125* 152*  BUN 13  --  11 16 17 13   CREATININE 1.75*  --  1.54* 1.77* 1.44* 1.50*  CALCIUM 8.9  --  8.4* 8.1* 7.9* 8.1*  MG  --  1.8  --   --   --   --     Liver  Function Tests: Recent Labs  Lab 12/20/22 1023 12/20/22 1704 12/24/22 0104  AST 11 15 14*  ALT 9 14 12   ALKPHOS 48 51 33*  BILITOT 1.7* 1.7* 0.8  PROT 7.9 7.8 5.7*  ALBUMIN 3.6 3.3* 2.1*    CBG: Recent Labs  Lab 12/23/22 1147 12/23/22 1655 12/23/22 2010 12/24/22 0758 12/24/22 1205  GLUCAP 144* 121* 143* 135* 111*      Time coordinating discharge: 35 minutes  SIGNED:  Marcellus Scott, MD,  FACP, FHM, SFHM, [facility], [facility]   [facility] [place]     To contact the attending provider between 7A-7P or the covering provider during after hours 7P-7A, please log into the web site [email or web address] and access using universal [place] password for that web site. If you do not have the password, please call the hospital operator.

## 2022-12-24 NOTE — Progress Notes (Signed)
Cardiac Individual Treatment Plan  Patient Details  Name: LABAN MCCLAUGHRY MRN: [id number] Date of Birth: [birth year] Referring Provider:   Flowsheet Row INTENSIVE CARDIAC REHAB ORIENT from 11/14/2022 in [facility]  Referring Provider Bryan Lemma, MD       Initial Encounter Date:  Flowsheet Row INTENSIVE CARDIAC REHAB ORIENT from 11/14/2022 in [facility] for Heart, Vascular, & Lung Health  Date 11/14/22       Visit Diagnosis: 10/20/22 S/P drug eluting coronary stent placement  10/19/22 NSTEMI (non-ST elevated myocardial infarction)  Patient's Home Medications on Admission: No current facility-administered medications for this encounter.  Current Outpatient Medications:    amoxicillin-clavulanate (AUGMENTIN) 875-125 MG tablet, Take 1 tablet by mouth 2 (two) times daily for 7 days., Disp: 14 tablet, Rfl: 0   [START ON 12/25/2022] insulin degludec (TRESIBA FLEXTOUCH) 100 UNIT/ML FlexTouch Pen, Inject 38 Units into the skin daily., Disp: , Rfl:   Facility-Administered Medications Ordered in Other Encounters:    acetaminophen (TYLENOL) tablet 650 mg, 650 mg, Oral, Q6H PRN **OR** acetaminophen (TYLENOL) suppository 650 mg, 650 mg, Rectal, Q6H PRN, Berna Bue, MD   aspirin EC tablet 81 mg, 81 mg, Oral, Daily, Phylliss Blakes A, MD, 81 mg at 12/24/22 0827   carvedilol (COREG) tablet 12.5 mg, 12.5 mg, Oral, BID WC, Hongalgi, Anand D, MD   enoxaparin (LOVENOX) injection 40 mg, 40 mg, Subcutaneous, Q24H, Fredricka Bonine, Chelsea A, MD, 40 mg at 12/24/22 0827   fentaNYL (SUBLIMAZE) injection 75 mcg, 75 mcg, Intravenous, Q1H PRN, Berna Bue, MD, 75 mcg at 12/20/22 2145   insulin aspart (novoLOG) injection 0-5 Units, 0-5 Units, Subcutaneous, QHS, Phylliss Blakes A, MD, 3 Units at 12/21/22 2222   insulin aspart (novoLOG) injection 0-9 Units, 0-9 Units, Subcutaneous, TID WC, Phylliss Blakes A, MD, 1 Units at  12/24/22 0826   insulin aspart (novoLOG) injection 8 Units, 8 Units, Subcutaneous, TID WC, Berna Bue, MD, 8 Units at 12/24/22 0826   insulin glargine-yfgn (SEMGLEE) injection 20 Units, 20 Units, Subcutaneous, QHS, Phylliss Blakes A, MD, 20 Units at 12/23/22 2020   melatonin tablet 3 mg, 3 mg, Oral, QHS, Hongalgi, Anand D, MD, 3 mg at 12/23/22 2019   nitroGLYCERIN (NITROSTAT) SL tablet 0.4 mg, 0.4 mg, Sublingual, Q5 min PRN, Fredricka Bonine, Chelsea A, MD   ondansetron (ZOFRAN) tablet 4 mg, 4 mg, Oral, Q6H PRN **OR** ondansetron (ZOFRAN) injection 4 mg, 4 mg, Intravenous, Q6H PRN, Berna Bue, MD   oxyCODONE-acetaminophen (PERCOCET/ROXICET) 5-325 MG per tablet 1 tablet, 1 tablet, Oral, Q4H PRN, Berna Bue, MD, 1 tablet at 12/24/22 0510   pantoprazole (PROTONIX) EC tablet 40 mg, 40 mg, Oral, BID, Phylliss Blakes A, MD, 40 mg at 12/24/22 0827   piperacillin-tazobactam (ZOSYN) IVPB 3.375 g, 3.375 g, Intravenous, Q8H, Connor, Chelsea A, MD, Last Rate: 12.5 mL/hr at 12/24/22 0625, 3.375 g at 12/24/22 4098   rosuvastatin (CRESTOR) tablet 40 mg, 40 mg, Oral, Daily, Phylliss Blakes A, MD, 40 mg at 12/24/22 0827   ticagrelor (BRILINTA) tablet 90 mg, 90 mg, Oral, BID, Phylliss Blakes A, MD, 90 mg at 12/24/22 0827  Past Medical History: Past Medical History:  Diagnosis Date   CAD (coronary artery disease)    Crohn's disease (HCC)    Diabetes mellitus (HCC)    Dilation of aorta (HCC)    Hypertension    Ulcerative colitis (HCC)     Tobacco Use: Social History   Tobacco  Use  Smoking Status Never  Smokeless Tobacco Never    Labs: Review Flowsheet  More data exists      Latest Ref Rng & Units 12/10/2019 12/01/2020 01/30/2021 10/20/2022 10/21/2022  Labs for ITP Cardiac and Pulmonary Rehab  Cholestrol 0 - 200 mg/dL 161  - - - 096   LDL (calc) 0 - 99 mg/dL 045  - - - 62   HDL-C >40 mg/dL 98.11  - - - 50   Trlycerides <150 mg/dL 914.7  - - - 99   Hemoglobin A1c 4.8 - 5.6 % 9.4 Repeated and  verified X2.  10.7  8.6  12.1  12.1     Capillary Blood Glucose: Lab Results  Component Value Date   GLUCAP 111 (H) 12/24/2022   GLUCAP 135 (H) 12/24/2022   GLUCAP 143 (H) 12/23/2022   GLUCAP 121 (H) 12/23/2022   GLUCAP 144 (H) 12/23/2022     Exercise Target Goals: Exercise Program Goal: Individual exercise prescription set using results from initial 6 min walk test and THRR while considering  patient's activity barriers and safety.   Exercise Prescription Goal: Initial exercise prescription builds to 30-45 minutes a day of aerobic activity, 2-3 days per week.  Home exercise guidelines will be given to patient during program as part of exercise prescription that the participant will acknowledge.  Activity Barriers & Risk Stratification:  Activity Barriers & Cardiac Risk Stratification - 11/14/22 1319       Activity Barriers & Cardiac Risk Stratification   Activity Barriers Balance Concerns    Cardiac Risk Stratification High             6 Minute Walk:  6 Minute Walk     Row Name 11/14/22 0903         6 Minute Walk   Phase Initial     Distance 1708 feet     Walk Time 6 minutes     # of Rest Breaks 0     MPH 3.23     METS 4.63     RPE 8     Perceived Dyspnea  0     VO2 Peak 16.22     Symptoms No     Resting HR 86 bpm     Resting BP 110/72     Resting Oxygen Saturation  100 %     Exercise Oxygen Saturation  during 6 min walk 100 %     Max Ex. HR 118 bpm     Max Ex. BP 138/80     2 Minute Post BP 114/70              Oxygen Initial Assessment:   Oxygen Re-Evaluation:   Oxygen Discharge (Final Oxygen Re-Evaluation):   Initial Exercise Prescription:  Initial Exercise Prescription - 11/14/22 1300       Date of Initial Exercise RX and Referring Provider   Date 11/14/22    Referring Provider Bryan Lemma, MD    Expected Discharge Date 01/25/23      Recumbant Bike   Level 2.2    RPM 60    Watts 25    Minutes 15    METs 4.6      NuStep    Level 2    SPM 80    Minutes 15    METs 4.6      Prescription Details   Frequency (times per week) 3    Duration Progress to 30 minutes of continuous aerobic without signs/symptoms of physical distress  Intensity   THRR 40-80% of Max Heartrate 66-133    Ratings of Perceived Exertion 11-13    Perceived Dyspnea 0-4      Progression   Progression Continue progressive overload as per policy without signs/symptoms or physical distress.      Resistance Training   Training Prescription Yes    Weight 4 lbs    Reps 10-15             Perform Capillary Blood Glucose checks as needed.  Exercise Prescription Changes:   Exercise Prescription Changes     Row Name 11/18/22 1200 12/04/22 1400           Response to Exercise   Blood Pressure (Admit) 114/74 106/66      Blood Pressure (Exercise) 152/84 146/70      Blood Pressure (Exit) 107/71 110/75      Heart Rate (Admit) 95 bpm 97 bpm      Heart Rate (Exercise) 117 bpm 119 bpm      Heart Rate (Exit) 98 bpm 93 bpm      Rating of Perceived Exertion (Exercise) 13 11      Symptoms Fatigue Pt diaphoretic with low BP      Comments Pt's first day in the CRP2 program Reviewed METs      Duration Progress to 30 minutes of  aerobic without signs/symptoms of physical distress Continue with 30 min of aerobic exercise without signs/symptoms of physical distress.      Intensity THRR unchanged THRR unchanged        Progression   Progression Continue to progress workloads to maintain intensity without signs/symptoms of physical distress. Continue to progress workloads to maintain intensity without signs/symptoms of physical distress.      Average METs 2.1 2.9        Resistance Training   Training Prescription Yes No      Weight 4 lbs No weights on wednesdays      Reps 10-15 --      Time 10 Minutes --        Interval Training   Interval Training No No        Recumbant Bike   Level 2 3      RPM 61 70      Watts 33 45       Minutes 15 15      METs 2.2 3.4        NuStep   Level 1  reduced from level 2  to level 1 due to fatigue 3      SPM 68 86      Minutes 10 15      METs 2 2.4               Exercise Comments:   Exercise Comments     Row Name 11/18/22 1207 12/04/22 1425 12/18/22 1000       Exercise Comments Pt's first day in the CRP2 program. Pt becaem fatuiged on the nustep and level was reduced to 1. Pt did 10 minutes on nustep. No pain or other symptoms noted. Reviewed METs. Pt is progessing well. Pt became dizzy during cool-down. Pt was hypotensive and taken to treatment room. Pt due for review of METs and goals. Pt was hospitalized and is not cleared to return yet. Will complete when patient returns.              Exercise Goals and Review:   Exercise Goals     Row Name 11/14/22 1321  Exercise Goals   Increase Physical Activity Yes       Expected Outcomes Short Term: Attend rehab on a regular basis to increase amount of physical activity.;Long Term: Add in home exercise to make exercise part of routine and to increase amount of physical activity.;Long Term: Exercising regularly at least 3-5 days a week.       Increase Strength and Stamina Yes       Intervention Provide advice, education, support and counseling about physical activity/exercise needs.;Develop an individualized exercise prescription for aerobic and resistive training based on initial evaluation findings, risk stratification, comorbidities and participant's personal goals.       Expected Outcomes Short Term: Increase workloads from initial exercise prescription for resistance, speed, and METs.;Short Term: Perform resistance training exercises routinely during rehab and add in resistance training at home;Long Term: Improve cardiorespiratory fitness, muscular endurance and strength as measured by increased METs and functional capacity ( )       Able to understand and use rate of perceived exertion (RPE) scale  Yes       Intervention Provide education and explanation on how to use RPE scale       Expected Outcomes Short Term: Able to use RPE daily in rehab to express subjective intensity level;Long Term:  Able to use RPE to guide intensity level when exercising independently       Knowledge and understanding of Target Heart Rate Range (THRR) Yes       Intervention Provide education and explanation of THRR including how the numbers were predicted and where they are located for reference       Expected Outcomes Short Term: Able to state/look up THRR;Long Term: Able to use THRR to govern intensity when exercising independently;Short Term: Able to use daily as guideline for intensity in rehab       Understanding of Exercise Prescription Yes       Intervention Provide education, explanation, and written materials on patient's individual exercise prescription       Expected Outcomes Short Term: Able to explain program exercise prescription;Long Term: Able to explain home exercise prescription to exercise independently                Exercise Goals Re-Evaluation :  Exercise Goals Re-Evaluation     Row Name 11/18/22 1206             Exercise Goal Re-Evaluation   Exercise Goals Review Increase Physical Activity;Increase Strength and Stamina;Able to understand and use rate of perceived exertion (RPE) scale;Knowledge and understanding of Target Heart Rate Range (THRR);Understanding of Exercise Prescription       Comments Pt's first day in the CRP2 program. Pt understands the exercise Rx, RPE sclae and THRR.       Expected Outcomes Will continue to monitor patient and progress exercise workloads as tolerated.                Discharge Exercise Prescription (Final Exercise Prescription Changes):  Exercise Prescription Changes - 12/04/22 1400       Response to Exercise   Blood Pressure (Admit) 106/66    Blood Pressure (Exercise) 146/70    Blood Pressure (Exit) 110/75    Heart Rate (Admit) 97  bpm    Heart Rate (Exercise) 119 bpm    Heart Rate (Exit) 93 bpm    Rating of Perceived Exertion (Exercise) 11    Symptoms Pt diaphoretic with low BP    Comments Reviewed METs    Duration Continue with 30 min of  aerobic exercise without signs/symptoms of physical distress.    Intensity THRR unchanged      Progression   Progression Continue to progress workloads to maintain intensity without signs/symptoms of physical distress.    Average METs 2.9      Resistance Training   Training Prescription No    Weight No weights on wednesdays      Interval Training   Interval Training No      Recumbant Bike   Level 3    RPM 70    Watts 45    Minutes 15    METs 3.4      NuStep   Level 3    SPM 86    Minutes 15    METs 2.4             Nutrition:  Target Goals: Understanding of nutrition guidelines, daily intake of sodium 1500mg , cholesterol 200mg , calories 30% from fat and 7% or less from saturated fats, daily to have 5 or more servings of fruits and vegetables.  Biometrics:  Pre Biometrics - 11/14/22 0815       Pre Biometrics   Waist Circumference 41.5 inches    Hip Circumference 41.5 inches    Waist to Hip Ratio 1 %    Triceps Skinfold 12 mm    % Body Fat 27.4 %    Grip Strength 42 kg    Flexibility 12.5 in    Single Leg Stand 10.68 seconds              Nutrition Therapy Plan and Nutrition Goals:  Nutrition Therapy & Goals - 12/19/22 0845       Nutrition Therapy   Diet Heart Healthy/Carbohydrate Consistent Diet    Drug/Food Interactions Statins/Certain Fruits      Personal Nutrition Goals   Nutrition Goal Patient to identify strategies for reducing cardiovascular risk by attending the [facility] education and nutrition series weekly.    Personal Goal #2 Patient to improve diet quality by using the plate method as a guide for meal planning to include lean protein/plant protein, fruits, vegetables, whole grains, nonfat dairy as part of a well-balanced diet.     Personal Goal #3 Patient to identify strategies for improving blood sugar control with goal A1c <7%    Comments Loran has not attended cardiac rehab since 5/20 due to hospitalization; he is awaiting clearance to come back. Prior to his absences, he has been attending the [facility] education and nutrition series regularly. Jaidan reported taking jardiance and using the humalog sliding scale regiment for blood sugar management. Previously recommended follow-up with endocrinology as patient had not been taking tresiba. He has started making some dietary changes including increasing lean protein and increasing vegetable and fruit intake.  He will benefit from participation in intensive cardiac rehab for nutrition, exercise, and lifestyle modification.      Intervention Plan   Intervention Prescribe, educate and counsel regarding individualized specific dietary modifications aiming towards targeted core components such as weight, hypertension, lipid management, diabetes, heart failure and other comorbidities.;Nutrition handout(s) given to patient.    Expected Outcomes Short Term Goal: Understand basic principles of dietary content, such as calories, fat, sodium, cholesterol and nutrients.;Long Term Goal: Adherence to prescribed nutrition plan.             Nutrition Assessments:  Nutrition Assessments - 11/21/22 1625       Rate Your Plate Scores   Pre Score 62  MEDIFICTS Score Key: ?70 Need to make dietary changes  40-70 Heart Healthy Diet ? 40 Therapeutic Level Cholesterol Diet   Flowsheet Row INTENSIVE CARDIAC REHAB from 11/20/2022 in [facility], V[facility], [facility]  Picture Your Plate Total Score on Admission 62      Picture Your Plate Scores: <29 Unhealthy dietary pattern with much room for improvement. 41-50 Dietary pattern unlikely to meet recommendations for good health and room for improvement. 51-60 More healthful dietary  pattern, with some room for improvement.  >60 Healthy dietary pattern, although there may be some specific behaviors that could be improved.    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 He will benefit from participation in intensive cardiac rehab for nutrition, exercise, and lifestyle modification. Discussed the plate method as a guide for meal planning and protein and high fiber foods to aid with blood sugar control. Joden reports taking jardiance and using the humalog sliding scale regiment he was given in the hospital. He reports not taking tresiba over the last few days; recommended follow-up with endocrinology. He has started making some dietary changes including increasing lean protein and increasing vegetable and fruit intake. He will benefit from participation in intensive cardiac rehab for nutrition, exercise, and lifestyle modification. Raidel has not attended cardiac rehab since 5/20 due to hospitalization; he is awaiting clearance to come back. Prior to his absences, he has been attending the [facility] education and nutrition series regularly. Delvis reported taking jardiance and using the humalog sliding scale regiment for blood sugar management. Previously recommended follow-up with endocrinology as patient had not been taking tresiba. He has started making some dietary changes including increasing lean protein and increasing vegetable and fruit intake. He will benefit from participation in intensive cardiac rehab for nutrition, exercise, and lifestyle modification.               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 He will benefit from participation in intensive cardiac rehab for nutrition, exercise, and lifestyle modification. Discussed the plate method as a guide for meal planning and protein and high fiber foods to aid with blood sugar control. Alexei reports taking jardiance and using the humalog sliding scale regiment he was given in the hospital. He reports not taking tresiba over the last few days; recommended follow-up with endocrinology. He has started making some dietary changes including increasing lean protein and increasing vegetable and fruit intake. He will benefit from participation in intensive cardiac rehab for nutrition, exercise, and lifestyle modification. Moritz has not attended  cardiac rehab since 5/20 due to hospitalization; he is awaiting clearance to come back. Prior to his absences, he has been attending the [facility] education and nutrition series regularly. Kipling reported taking jardiance and using the humalog sliding scale regiment for blood sugar management. Previously recommended follow-up with endocrinology as patient had not been taking tresiba. He has started making some dietary changes including increasing lean protein and increasing vegetable and fruit intake. He will benefit from participation in intensive cardiac rehab for nutrition, exercise, and lifestyle modification.              Nutrition Goals Discharge (Final Nutrition Goals Re-Evaluation):  Nutrition Goals Re-Evaluation - 12/19/22 0845       Goals   Comment A1c 12.0,  lipoprotein A 125.9, lipids WNL    Expected Outcome Kevontae has not attended cardiac rehab since 5/20 due to hospitalization; he is awaiting clearance to come back. Prior to his absences, he has been attending the [facility] education and nutrition series regularly. Kalim reported taking jardiance and using the humalog sliding scale regiment for blood sugar management. Previously recommended follow-up with endocrinology as patient had not been taking tresiba. He has started making some dietary changes including increasing lean protein and increasing vegetable and fruit intake. He will benefit from participation in intensive cardiac rehab for nutrition, exercise, and lifestyle modification.             Psychosocial: Target Goals: Acknowledge presence or absence of significant depression and/or stress, maximize coping skills, provide positive support system. Participant is able to verbalize types and ability to use techniques and skills needed for reducing stress and depression.  Initial Review & Psychosocial Screening:  Initial Psych Review & Screening - 12/24/22 1339       Initial Review   Comments Will continue to monitor and offer support as needed             Quality of Life Scores:  Quality of Life - 11/14/22 1316       Quality of Life   Select Quality of Life      Quality of Life Scores   Health/Function Pre 27.11 %    Socioeconomic Pre 28 %    Psych/Spiritual Pre 27.86 %    Family Pre 25.8 %    GLOBAL Pre 27.84 %            Scores of 19 and below usually indicate a poorer quality of life in these areas.  A difference of  2-3 points is a clinically meaningful difference.  A difference of 2-3 points in the total score of the Quality of Life Index has been associated with significant improvement in overall quality of life, self-image, physical symptoms, and general health in studies assessing change in quality of life.  PHQ-9: Review Flowsheet  More data exists       11/14/2022 09/04/2018 08/24/2018 07/08/2018 03/10/2018  Depression screen PHQ 2/9  Decreased Interest 0 0 0 0 0  Down, Depressed, Hopeless 0 0 0 0 0  PHQ - 2 Score 0 0 0 0 0  Altered sleeping 3 - - - -  Tired, decreased energy 0 - - - -  Change in appetite 0 - - - -  Feeling bad or failure about yourself  0 - - - -  Trouble concentrating 0 - - - -  Moving slowly or fidgety/restless 0 - - - -  Suicidal thoughts 0 - - - -  PHQ-9 Score 3 - - - -  Difficult doing work/chores Not difficult at all - - - -   Interpretation of Total Score  Total Score Depression Severity:  1-4 = Minimal depression, 5-9 = Mild depression, 10-14 = Moderate depression, 15-19 = Moderately severe depression, 20-27 = Severe depression   Psychosocial Evaluation and Intervention:   Psychosocial Re-Evaluation:  Psychosocial Re-Evaluation     Row Name 11/18/22 1714 11/27/22 0946 12/23/22 0929 12/24/22 1339       Psychosocial Re-Evaluation   Current issues with Current Sleep Concerns None Identified Current Stress Concerns Current Stress Concerns    Comments Dyke did not voice any concerns or stressors on his first day of exercise -- Currently admitted to the hospital. Exercise is currently on hold. Currently admitted to the hospital. Exercise is currently on hold.    Expected Outcomes Howard will have decreased concerns or stressors upon complketion of intensive cardiac rehab. -- -- --    Interventions -- Encouraged to attend Cardiac Rehabilitation for the exercise Encouraged to attend Cardiac Rehabilitation for the exercise Encouraged to attend Cardiac Rehabilitation for the exercise    Continue Psychosocial Services  Follow up required by staff No Follow up required No Follow up required No Follow up required      Initial Review   Comments Will continue to monitor and offer support as needed Will continue to monitor and offer support as needed Will continue to monitor and offer support as needed --              Psychosocial Discharge (Final Psychosocial Re-Evaluation):  Psychosocial Re-Evaluation - 12/24/22 1339       Psychosocial Re-Evaluation   Current issues with Current Stress Concerns    Comments Currently admitted to the hospital. Exercise is currently on hold.    Interventions Encouraged to attend Cardiac Rehabilitation for the exercise    Continue Psychosocial Services  No Follow up required             Vocational Rehabilitation: Provide vocational rehab assistance to qualifying candidates.   Vocational Rehab Evaluation & Intervention:  Vocational Rehab - 11/14/22 1015       Initial Vocational Rehab Evaluation & Intervention   Assessment shows need for Vocational Rehabilitation No   Pt denies any needs at this time            Education: Education Goals: Education classes will be provided on a weekly basis, covering required topics. Participant will state understanding/return demonstration of topics presented.    Education     Row Name 11/18/22 0900     Education   Cardiac Education Topics [employer]   Select Core Videos     Core Videos   Educator Dietitian   Select Nutrition   Nutrition Facts on Fat   Instruction Review Code 1- Verbalizes Understanding   Class Start Time 0815   Class Stop Time [phone]   Class Time Calculation (min) 37 min    Row Name 11/20/22 1000     Education   Cardiac Education Topics Pritikin   Secondary school teacher School   Educator Dietitian   Weekly Topic Fast Evening Meals   Instruction Review Code 1- Verbalizes Understanding   Class Start Time 0815   Class Stop Time [id number]   Class Time Calculation (min) 37 min    Row Name 11/22/22 0900     Education   Cardiac Education Topics Pritikin   Psychologist, counselling  Select Nutrition   Nutrition Vitamins and Minerals   Instruction Review Code 1- Verbalizes Understanding   Class Start Time 0815   Class Stop Time 0855   Class  Time Calculation (min) 40 min    Row Name 11/25/22 1100     Education   Cardiac Education Topics Pritikin   Select Workshops     Workshops   Educator Exercise Physiologist   Select Psychosocial   Psychosocial Workshop Healthy Sleep for a Healthy Heart   Instruction Review Code 1- Verbalizes Understanding   Class Start Time 0815   Class Stop Time 0856   Class Time Calculation (min) 41 min    Row Name 11/27/22 1000     Education   Cardiac Education Topics Pritikin   Customer service manager   Weekly Topic [employer]- Spotlight on the [facility] Zones   Instruction Review Code 1- Verbalizes Understanding   Class Start Time [phone]   Class Stop Time [phone]   Class Time Calculation (min) 32 min    Row Name 11/29/22 0900     Education   Cardiac Education Topics Pritikin   Select Core Videos     Core Videos   Educator Exercise Physiologist   Select Exercise Education   Exercise Education Improving Performance   Instruction Review Code 1- Verbalizes Understanding   Class Start Time [phone]   Class Stop Time 0855   Class Time Calculation (min) 43 min    Row Name 12/02/22 0900     Education   Cardiac Education Topics Pritikin   Glass blower/designer Nutrition   Nutrition Workshop Fueling a Forensic psychologist   Instruction Review Code 1- Verbalizes Understanding   Class Start Time 0815   Class Stop Time 0858   Class Time Calculation (min) 43 min    Row Name 12/04/22 0900     Education   Cardiac Education Topics Pritikin   Secondary school teacher School   Educator Dietitian   Weekly Topic Simple Sides and Sauces   Instruction Review Code 1- Verbalizes Understanding   Class Start Time 0815   Class Stop Time 0850   Class Time Calculation (min) 35 min    Row Name 12/06/22 0900     Education   Cardiac Education Topics Pritikin   Select Core Videos     Core Videos   Educator  Exercise Physiologist   Select Psychosocial   Psychosocial How Our Thoughts Can Heal Our Hearts   Instruction Review Code 1- Verbalizes Understanding   Class Start Time 0815   Class Stop Time 0855   Class Time Calculation (min) 40 min    Row Name 12/09/22 1000     Education   Cardiac Education Topics Pritikin   Geographical information systems officer Psychosocial   Psychosocial Workshop From Head to Heart: The Power of a Healthy Outlook   Instruction Review Code 1- Verbalizes Understanding   Class Start Time 0815   Class Stop Time 0903   Class Time Calculation (min) 48 min            Core Videos: Exercise    Move It!  Clinical staff conducted group or individual video education with verbal and written material and guidebook.  Patient learns the recommended Pritikin exercise program. Exercise with the goal of living  a long, healthy life. Some of the health benefits of exercise include controlled diabetes, healthier blood pressure levels, improved cholesterol levels, improved heart and lung capacity, improved sleep, and better body composition. Everyone should speak with their doctor before starting or changing an exercise routine.  Biomechanical Limitations Clinical staff conducted group or individual video education with verbal and written material and guidebook.  Patient learns how biomechanical limitations can impact exercise and how we can mitigate and possibly overcome limitations to have an impactful and balanced exercise routine.  Body Composition Clinical staff conducted group or individual video education with verbal and written material and guidebook.  Patient learns that body composition (ratio of muscle mass to fat mass) is a key component to assessing overall fitness, rather than body weight alone. Increased fat mass, especially visceral belly fat, can put [place] at increased risk for metabolic syndrome, type 2 diabetes, heart disease,  and even death. It is recommended to combine diet and exercise (cardiovascular and resistance training) to improve your body composition. Seek guidance from your physician and exercise physiologist before implementing an exercise routine.  Exercise Action Plan Clinical staff conducted group or individual video education with verbal and written material and guidebook.  Patient learns the recommended strategies to achieve and enjoy long-term exercise adherence, including variety, self-motivation, self-efficacy, and positive decision making. Benefits of exercise include fitness, good health, weight management, more energy, better sleep, less stress, and overall well-being.  Medical   Heart Disease Risk Reduction Clinical staff conducted group or individual video education with verbal and written material and guidebook.  Patient learns our heart is our most vital organ as it circulates oxygen, nutrients, white blood cells, and hormones throughout the entire body, and carries waste away. Data supports a plant-based eating plan like the Pritikin Program for its effectiveness in slowing progression of and reversing heart disease. The video provides a number of recommendations to address heart disease.   Metabolic Syndrome and Belly Fat  Clinical staff conducted group or individual video education with verbal and written material and guidebook.  Patient learns what metabolic syndrome is, how it leads to heart disease, and how one can reverse it and keep it from coming back. You have metabolic syndrome if you have 3 of the following 5 criteria: abdominal obesity, high blood pressure, high triglycerides, low HDL cholesterol, and high blood sugar.  Hypertension and Heart Disease Clinical staff conducted group or individual video education with verbal and written material and guidebook.  Patient learns that high blood pressure, or hypertension, is very common in the [place]. Hypertension is largely due to  excessive salt intake, but other important risk factors include being overweight, physical inactivity, drinking too much alcohol, smoking, and not eating enough potassium from fruits and vegetables. High blood pressure is a leading risk factor for heart attack, stroke, congestive heart failure, dementia, kidney failure, and premature death. Long-term effects of excessive salt intake include stiffening of the arteries and thickening of heart muscle and organ damage. Recommendations include ways to reduce hypertension and the risk of heart disease.  Diseases of Our Time - Focusing on Diabetes Clinical staff conducted group or individual video education with verbal and written material and guidebook.  Patient learns why the best way to stop diseases of our time is prevention, through food and other lifestyle changes. Medicine (such as prescription pills and surgeries) is often only a Band-Aid on the problem, not a long-term solution. Most common diseases of our time include obesity, type 2 diabetes,  hypertension, heart disease, and cancer. The Pritikin Program is recommended and has been proven to help reduce, reverse, and/or prevent the damaging effects of metabolic syndrome.  Nutrition   Overview of the Pritikin Eating Plan  Clinical staff conducted group or individual video education with verbal and written material and guidebook.  Patient learns about the Pritikin Eating Plan for disease risk reduction. The Pritikin Eating Plan emphasizes a wide variety of unrefined, minimally-processed carbohydrates, like fruits, vegetables, whole grains, and legumes. Go, Caution, and Stop food choices are explained. Plant-based and lean animal proteins are emphasized. Rationale provided for low sodium intake for blood pressure control, low added sugars for blood sugar stabilization, and low added fats and oils for coronary artery disease risk reduction and weight management.  Calorie Density  Clinical staff conducted  group or individual video education with verbal and written material and guidebook.  Patient learns about calorie density and how it impacts the Pritikin Eating Plan. Knowing the characteristics of the food you choose will help you decide whether those foods will lead to weight gain or weight loss, and whether you want to consume more or less of them. Weight loss is usually a side effect of the Pritikin Eating Plan because of its focus on low calorie-dense foods.  Label Reading  Clinical staff conducted group or individual video education with verbal and written material and guidebook.  Patient learns about the Pritikin recommended label reading guidelines and corresponding recommendations regarding calorie density, added sugars, sodium content, and whole grains.  Dining Out - Part 1  Clinical staff conducted group or individual video education with verbal and written material and guidebook.  Patient learns that restaurant meals can be sabotaging because they can be so high in calories, fat, sodium, and/or sugar. Patient learns recommended strategies on how to positively address this and avoid unhealthy pitfalls.  Facts on Fats  Clinical staff conducted group or individual video education with verbal and written material and guidebook.  Patient learns that lifestyle modifications can be just as effective, if not more so, as many medications for lowering your risk of heart disease. A Pritikin lifestyle can help to reduce your risk of inflammation and atherosclerosis (cholesterol build-up, or plaque, in the artery walls). Lifestyle interventions such as dietary choices and physical activity address the cause of atherosclerosis. A review of the types of fats and their impact on blood cholesterol levels, along with dietary recommendations to reduce fat intake is also included.  Nutrition Action Plan  Clinical staff conducted group or individual video education with verbal and written material and  guidebook.  Patient learns how to incorporate Pritikin recommendations into their lifestyle. Recommendations include planning and keeping personal health goals in mind as an important part of their success.  Healthy Mind-Set    Healthy Minds, Bodies, Hearts  Clinical staff conducted group or individual video education with verbal and written material and guidebook.  Patient learns how to identify when they are stressed. Video will discuss the impact of that stress, as well as the many benefits of stress management. Patient will also be introduced to stress management techniques. The way we think, act, and feel has an impact on our hearts.  How Our Thoughts Can Heal Our Hearts  Clinical staff conducted group or individual video education with verbal and written material and guidebook.  Patient learns that negative thoughts can cause depression and anxiety. This can result in negative lifestyle behavior and serious health problems. Cognitive behavioral therapy is an effective method to  help control our thoughts in order to change and improve our emotional outlook.  Additional Videos:  Exercise    Improving Performance  Clinical staff conducted group or individual video education with verbal and written material and guidebook.  Patient learns to use a non-linear approach by alternating intensity levels and lengths of time spent exercising to help burn more calories and lose more body fat. Cardiovascular exercise helps improve heart health, metabolism, hormonal balance, blood sugar control, and recovery from fatigue. Resistance training improves strength, endurance, balance, coordination, reaction time, metabolism, and muscle mass. Flexibility exercise improves circulation, posture, and balance. Seek guidance from your physician and exercise physiologist before implementing an exercise routine and learn your capabilities and proper form for all exercise.  Introduction to Yoga  Clinical staff  conducted group or individual video education with verbal and written material and guidebook.  Patient learns about yoga, a discipline of the coming together of mind, breath, and body. The benefits of yoga include improved flexibility, improved range of motion, better posture and core strength, increased lung function, weight loss, and positive self-image. Yoga's heart health benefits include lowered blood pressure, healthier heart rate, decreased cholesterol and triglyceride levels, improved immune function, and reduced stress. Seek guidance from your physician and exercise physiologist before implementing an exercise routine and learn your capabilities and proper form for all exercise.  Medical   Aging: Enhancing Your Quality of Life  Clinical staff conducted group or individual video education with verbal and written material and guidebook.  Patient learns key strategies and recommendations to stay in good physical health and enhance quality of life, such as prevention strategies, having an advocate, securing a Health Care Proxy and Power of Attorney, and keeping a list of medications and system for tracking them. It also discusses how to avoid risk for bone loss.  Biology of Weight Control  Clinical staff conducted group or individual video education with verbal and written material and guidebook.  Patient learns that weight gain occurs because we consume more calories than we burn (eating more, moving less). Even if your body weight is normal, you may have higher ratios of fat compared to muscle mass. Too much body fat puts you at increased risk for cardiovascular disease, heart attack, stroke, type 2 diabetes, and obesity-related cancers. In addition to exercise, following the Pritikin Eating Plan can help reduce your risk.  Decoding Lab Results  Clinical staff conducted group or individual video education with verbal and written material and guidebook.  Patient learns that lab test reflects one  measurement whose values change over time and are influenced by many factors, including medication, stress, sleep, exercise, food, hydration, pre-existing medical conditions, and more. It is recommended to use the knowledge from this video to become more involved with your lab results and evaluate your numbers to speak with your doctor.   Diseases of Our Time - Overview  Clinical staff conducted group or individual video education with verbal and written material and guidebook.  Patient learns that according to the CDC, 50% to 70% of chronic diseases (such as obesity, type 2 diabetes, elevated lipids, hypertension, and heart disease) are avoidable through lifestyle improvements including healthier food choices, listening to satiety cues, and increased physical activity.  Sleep Disorders Clinical staff conducted group or individual video education with verbal and written material and guidebook.  Patient learns how good quality and duration of sleep are important to overall health and well-being. Patient also learns about sleep disorders and how they impact health along  with recommendations to address them, including discussing with a physician.  Nutrition  Dining Out - Part 2 Clinical staff conducted group or individual video education with verbal and written material and guidebook.  Patient learns how to plan ahead and communicate in order to maximize their dining experience in a healthy and nutritious manner. Included are recommended food choices based on the type of restaurant the patient is visiting.   Fueling a Banker conducted group or individual video education with verbal and written material and guidebook.  There is a strong connection between our food choices and our health. Diseases like obesity and type 2 diabetes are very prevalent and are in large-part due to lifestyle choices. The Pritikin Eating Plan provides plenty of food and hunger-curbing satisfaction. It is  easy to follow, affordable, and helps reduce health risks.  Menu Workshop  Clinical staff conducted group or individual video education with verbal and written material and guidebook.  Patient learns that restaurant meals can sabotage health goals because they are often packed with calories, fat, sodium, and sugar. Recommendations include strategies to plan ahead and to communicate with the manager, chef, or server to help order a healthier meal.  Planning Your Eating Strategy  Clinical staff conducted group or individual video education with verbal and written material and guidebook.  Patient learns about the Pritikin Eating Plan and its benefit of reducing the risk of disease. The Pritikin Eating Plan does not focus on calories. Instead, it emphasizes high-quality, nutrient-rich foods. By knowing the characteristics of the foods, we choose, we can determine their calorie density and make informed decisions.  Targeting Your Nutrition Priorities  Clinical staff conducted group or individual video education with verbal and written material and guidebook.  Patient learns that lifestyle habits have a tremendous impact on disease risk and progression. This video provides eating and physical activity recommendations based on your personal health goals, such as reducing LDL cholesterol, losing weight, preventing or controlling type 2 diabetes, and reducing high blood pressure.  Vitamins and Minerals  Clinical staff conducted group or individual video education with verbal and written material and guidebook.  Patient learns different ways to obtain key vitamins and minerals, including through a recommended healthy diet. It is important to discuss all supplements you take with your doctor.   Healthy Mind-Set    Smoking Cessation  Clinical staff conducted group or individual video education with verbal and written material and guidebook.  Patient learns that cigarette smoking and tobacco addiction pose  a serious health risk which affects millions of people. Stopping smoking will significantly reduce the risk of heart disease, lung disease, and many forms of cancer. Recommended strategies for quitting are covered, including working with your doctor to develop a successful plan.  Culinary   Becoming a Set designer conducted group or individual video education with verbal and written material and guidebook.  Patient learns that cooking at home can be healthy, cost-effective, quick, and puts them in control. Keys to cooking healthy recipes will include looking at your recipe, assessing your equipment needs, planning ahead, making it simple, choosing cost-effective seasonal ingredients, and limiting the use of added fats, salts, and sugars.  Cooking - Breakfast and Snacks  Clinical staff conducted group or individual video education with verbal and written material and guidebook.  Patient learns how important breakfast is to satiety and nutrition through the entire day. Recommendations include key foods to eat during breakfast to help stabilize blood sugar levels  and to prevent overeating at meals later in the day. Planning ahead is also a key component.  Cooking - Educational psychologist conducted group or individual video education with verbal and written material and guidebook.  Patient learns eating strategies to improve overall health, including an approach to cook more at home. Recommendations include thinking of animal protein as a side on your plate rather than center stage and focusing instead on lower calorie dense options like vegetables, fruits, whole grains, and plant-based proteins, such as beans. Making sauces in large quantities to freeze for later and leaving the skin on your vegetables are also recommended to maximize your experience.  Cooking - Healthy Salads and Dressing Clinical staff conducted group or individual video education with verbal and written  material and guidebook.  Patient learns that vegetables, fruits, whole grains, and legumes are the foundations of the Pritikin Eating Plan. Recommendations include how to incorporate each of these in flavorful and healthy salads, and how to create homemade salad dressings. Proper handling of ingredients is also covered. Cooking - Soups and [employer] - Soups and Desserts Clinical staff conducted group or individual video education with verbal and written material and guidebook.  Patient learns that Pritikin soups and desserts make for easy, nutritious, and delicious snacks and meal components that are low in sodium, fat, sugar, and calorie density, while high in vitamins, minerals, and filling fiber. Recommendations include simple and healthy ideas for soups and desserts.   Overview     The Pritikin Solution Program Overview Clinical staff conducted group or individual video education with verbal and written material and guidebook.  Patient learns that the results of the Pritikin Program have been documented in more than 100 articles published in peer-reviewed journals, and the benefits include reducing risk factors for (and, in some cases, even reversing) high cholesterol, high blood pressure, type 2 diabetes, obesity, and more! An overview of the three key pillars of the Pritikin Program will be covered: eating well, doing regular exercise, and having a healthy mind-set.  WORKSHOPS  Exercise: Exercise Basics: Building Your Action Plan Clinical staff led group instruction and group discussion with PowerPoint presentation and patient guidebook. To enhance the learning environment the use of posters, models and videos may be added. At the conclusion of this workshop, patients will comprehend the difference between physical activity and exercise, as well as the benefits of incorporating both, into their routine. Patients will understand the FITT (Frequency, Intensity, Time, and Type) principle  and how to use it to build an exercise action plan. In addition, safety concerns and other considerations for exercise and cardiac rehab will be addressed by the presenter. The purpose of this lesson is to promote a comprehensive and effective weekly exercise routine in order to improve patients' overall level of fitness.   Managing Heart Disease: Your Path to a Healthier Heart Clinical staff led group instruction and group discussion with PowerPoint presentation and patient guidebook. To enhance the learning environment the use of posters, models and videos may be added.At the conclusion of this workshop, patients will understand the anatomy and physiology of the heart. Additionally, they will understand how Pritikin's three pillars impact the risk factors, the progression, and the management of heart disease.  The purpose of this lesson is to provide a high-level overview of the heart, heart disease, and how the Pritikin lifestyle positively impacts risk factors.  Exercise Biomechanics Clinical staff led group instruction and group discussion with PowerPoint presentation and patient  guidebook. To enhance the learning environment the use of posters, models and videos may be added. Patients will learn how the structural parts of their bodies function and how these functions impact their daily activities, movement, and exercise. Patients will learn how to promote a neutral spine, learn how to manage pain, and identify ways to improve their physical movement in order to promote healthy living. The purpose of this lesson is to expose patients to common physical limitations that impact physical activity. Participants will learn practical ways to adapt and manage aches and pains, and to minimize their effect on regular exercise. Patients will learn how to maintain good posture while sitting, walking, and lifting.  Balance Training and Fall Prevention  Clinical staff led group instruction and group  discussion with PowerPoint presentation and patient guidebook. To enhance the learning environment the use of posters, models and videos may be added. At the conclusion of this workshop, patients will understand the importance of their sensorimotor skills (vision, proprioception, and the vestibular system) in maintaining their ability to balance as they age. Patients will apply a variety of balancing exercises that are appropriate for their current level of function. Patients will understand the common causes for poor balance, possible solutions to these problems, and ways to modify their physical environment in order to minimize their fall risk. The purpose of this lesson is to teach patients about the importance of maintaining balance as they age and ways to minimize their risk of falling.  WORKSHOPS   Nutrition:  Fueling a Ship broker led group instruction and group discussion with PowerPoint presentation and patient guidebook. To enhance the learning environment the use of posters, models and videos may be added. Patients will review the foundational principles of the Pritikin Eating Plan and understand what constitutes a serving size in each of the food groups. Patients will also learn Pritikin-friendly foods that are better choices when away from home and review make-ahead meal and snack options. Calorie density will be reviewed and applied to three nutrition priorities: weight maintenance, weight loss, and weight gain. The purpose of this lesson is to reinforce (in a group setting) the key concepts around what patients are recommended to eat and how to apply these guidelines when away from home by planning and selecting Pritikin-friendly options. Patients will understand how calorie density may be adjusted for different weight management goals.  Mindful Eating  Clinical staff led group instruction and group discussion with PowerPoint presentation and patient guidebook. To enhance  the learning environment the use of posters, models and videos may be added. Patients will briefly review the concepts of the Pritikin Eating Plan and the importance of low-calorie dense foods. The concept of mindful eating will be introduced as well as the importance of paying attention to internal hunger signals. Triggers for non-hunger eating and techniques for dealing with triggers will be explored. The purpose of this lesson is to provide patients with the opportunity to review the basic principles of the Pritikin Eating Plan, discuss the value of eating mindfully and how to measure internal cues of hunger and fullness using the Hunger Scale. Patients will also discuss reasons for non-hunger eating and learn strategies to use for controlling emotional eating.  Targeting Your Nutrition Priorities Clinical staff led group instruction and group discussion with PowerPoint presentation and patient guidebook. To enhance the learning environment the use of posters, models and videos may be added. Patients will learn how to determine their genetic susceptibility to disease by reviewing their  family history. Patients will gain insight into the importance of diet as part of an overall healthy lifestyle in mitigating the impact of genetics and other environmental insults. The purpose of this lesson is to provide patients with the opportunity to assess their personal nutrition priorities by looking at their family history, their own health history and current risk factors. Patients will also be able to discuss ways of prioritizing and modifying the Pritikin Eating Plan for their highest risk areas  Menu  Clinical staff led group instruction and group discussion with PowerPoint presentation and patient guidebook. To enhance the learning environment the use of posters, models and videos may be added. Using menus brought in from [employer], or printed from [employer], patients will apply the Pritikin dining out  guidelines that were presented in the [employer] video. Patients will also be able to practice these guidelines in a variety of provided scenarios. The purpose of this lesson is to provide patients with the opportunity to practice hands-on learning of the Pritikin Dining Out guidelines with actual menus and practice scenarios.  Label Reading Clinical staff led group instruction and group discussion with PowerPoint presentation and patient guidebook. To enhance the learning environment the use of posters, models and videos may be added. Patients will review and discuss the Pritikin label reading guidelines presented in Pritikin's Label Reading Educational series video. Using fool labels brought in from local grocery stores and markets, patients will apply the label reading guidelines and determine if the packaged food meet the Pritikin guidelines. The purpose of this lesson is to provide patients with the opportunity to review, discuss, and practice hands-on learning of the Pritikin Label Reading guidelines with actual packaged food labels. Cooking School  Pritikin's [employer] are designed to teach patients ways to prepare quick, simple, and affordable recipes at home. The importance of nutrition's role in chronic disease risk reduction is reflected in its emphasis in the overall Pritikin program. By learning how to prepare essential core Pritikin Eating Plan recipes, patients will increase control over what they eat; be able to customize the flavor of foods without the use of added salt, sugar, or fat; and improve the quality of the food they consume. By learning a set of core recipes which are easily assembled, quickly prepared, and affordable, patients are more likely to prepare more healthy foods at home. These workshops focus on convenient breakfasts, simple entres, side dishes, and desserts which can be prepared with minimal effort and are consistent with nutrition  recommendations for cardiovascular risk reduction. Cooking [employer] are taught by a Armed forces logistics/support/administrative officer (RD) who has been trained by the [employer]. The chef or RD has a clear understanding of the importance of minimizing - if not completely eliminating - added fat, sugar, and sodium in recipes. Throughout the series of Cooking School Workshop sessions, patients will learn about healthy ingredients and efficient methods of cooking to build confidence in their capability to prepare    Cooking School weekly topics:  Adding Flavor- Sodium-Free  Fast and Healthy Breakfasts  Powerhouse Plant-Based Proteins  Satisfying Salads and Dressings  Simple Sides and Sauces  International Cuisine-Spotlight on the [employer] Zones  Delicious Desserts  Savory Soups  [employer] - Meals in a Astronomer Appetizers and Snacks  Comforting Weekend Breakfasts  One-Pot Wonders   Fast Evening Meals  Landscape architect Your Pritikin Plate  WORKSHOPS   Healthy Mindset (Psychosocial):  Focused Goals, Sustainable  Changes Clinical staff led group instruction and group discussion with PowerPoint presentation and patient guidebook. To enhance the learning environment the use of posters, models and videos may be added. Patients will be able to apply effective goal setting strategies to establish at least one personal goal, and then take consistent, meaningful action toward that goal. They will learn to identify common barriers to achieving personal goals and develop strategies to overcome them. Patients will also gain an understanding of how our mind-set can impact our ability to achieve goals and the importance of cultivating a positive and growth-oriented mind-set. The purpose of this lesson is to provide patients with a deeper understanding of how to set and achieve personal goals, as well as the tools and strategies needed to overcome common obstacles which may arise along  the way.  From Head to Heart: The Power of a Healthy Outlook  Clinical staff led group instruction and group discussion with PowerPoint presentation and patient guidebook. To enhance the learning environment the use of posters, models and videos may be added. Patients will be able to recognize and describe the impact of emotions and mood on physical health. They will discover the importance of self-care and explore self-care practices which may work for them. Patients will also learn how to utilize the 4 C's to cultivate a healthier outlook and better manage stress and challenges. The purpose of this lesson is to demonstrate to patients how a healthy outlook is an essential part of maintaining good health, especially as they continue their cardiac rehab journey.  Healthy Sleep for a Healthy Heart Clinical staff led group instruction and group discussion with PowerPoint presentation and patient guidebook. To enhance the learning environment the use of posters, models and videos may be added. At the conclusion of this workshop, patients will be able to demonstrate knowledge of the importance of sleep to overall health, well-being, and quality of life. They will understand the symptoms of, and treatments for, common sleep disorders. Patients will also be able to identify daytime and nighttime behaviors which impact sleep, and they will be able to apply these tools to help manage sleep-related challenges. The purpose of this lesson is to provide patients with a general overview of sleep and outline the importance of quality sleep. Patients will learn about a few of the most common sleep disorders. Patients will also be introduced to the concept of "sleep hygiene," and discover ways to self-manage certain sleeping problems through simple daily behavior changes. Finally, the workshop will motivate patients by clarifying the links between quality sleep and their goals of heart-healthy living.   Recognizing and  Reducing Stress Clinical staff led group instruction and group discussion with PowerPoint presentation and patient guidebook. To enhance the learning environment the use of posters, models and videos may be added. At the conclusion of this workshop, patients will be able to understand the types of stress reactions, differentiate between acute and chronic stress, and recognize the impact that chronic stress has on their health. They will also be able to apply different coping mechanisms, such as reframing negative self-talk. Patients will have the opportunity to practice a variety of stress management techniques, such as deep abdominal breathing, progressive muscle relaxation, and/or guided imagery.  The purpose of this lesson is to educate patients on the role of stress in their lives and to provide healthy techniques for coping with it.  Learning Barriers/Preferences:  Learning Barriers/Preferences - 11/14/22 1017       Learning Barriers/Preferences   Learning  Barriers None    Learning Preferences Audio;Computer/Internet;Group Instruction;Individual Instruction;Pictoral;Skilled Demonstration;Verbal Instruction;Video;Written Material             Education Topics:  Knowledge Questionnaire Score:  Knowledge Questionnaire Score - 11/14/22 1017       Knowledge Questionnaire Score   Pre Score 20/24             Core Components/Risk Factors/Patient Goals at Admission:  Personal Goals and Risk Factors at Admission - 11/14/22 1015       Core Components/Risk Factors/Patient Goals on Admission    Weight Management Yes;Weight Loss    Intervention Weight Management: Develop a combined nutrition and exercise program designed to reach desired caloric intake, while maintaining appropriate intake of nutrient and fiber, sodium and fats, and appropriate energy expenditure required for the weight goal.;Weight Management: Provide education and appropriate resources to help participant work on and  attain dietary goals.;Weight Management/Obesity: Establish reasonable short term and long term weight goals.    Admit Weight 206 lb 12.7 oz (93.8 kg)    Goal Weight: Long Term 190 lb (86.2 kg)   Pt goal   Expected Outcomes Short Term: Continue to assess and modify interventions until short term weight is achieved;Long Term: Adherence to nutrition and physical activity/exercise program aimed toward attainment of established weight goal;Weight Loss: Understanding of general recommendations for a balanced deficit meal plan, which promotes 1-2 lb weight loss per week and includes a negative energy balance of [phone] kcal/d;Understanding recommendations for meals to include 15-35% energy as protein, 25-35% energy from fat, 35-60% energy from carbohydrates, less than 200mg  of dietary cholesterol, 20-35 gm of total fiber daily;Understanding of distribution of calorie intake throughout the day with the consumption of 4-5 meals/snacks    Diabetes Yes    Intervention Provide education about signs/symptoms and action to take for hypo/hyperglycemia.;Provide education about proper nutrition, including hydration, and aerobic/resistive exercise prescription along with prescribed medications to achieve blood glucose in normal ranges: Fasting glucose 65-99 mg/dL    Expected Outcomes Short Term: Participant verbalizes understanding of the signs/symptoms and immediate care of hyper/hypoglycemia, proper foot care and importance of medication, aerobic/resistive exercise and nutrition plan for blood glucose control.;Long Term: Attainment of HbA1C < 7%.    Hypertension Yes    Intervention Provide education on lifestyle modifcations including regular physical activity/exercise, weight management, moderate sodium restriction and increased consumption of fresh fruit, vegetables, and low fat dairy, alcohol moderation, and smoking cessation.;Monitor prescription use compliance.    Expected Outcomes Short Term: Continued assessment  and intervention until BP is < 140/49mm HG in hypertensive participants. < 130/88mm HG in hypertensive participants with diabetes, heart failure or chronic kidney disease.;Long Term: Maintenance of blood pressure at goal levels.    Lipids Yes    Intervention Provide education and support for participant on nutrition & aerobic/resistive exercise along with prescribed medications to achieve LDL 70mg , HDL >40mg .    Expected Outcomes Short Term: Participant states understanding of desired cholesterol values and is compliant with medications prescribed. Participant is following exercise prescription and nutrition guidelines.;Long Term: Cholesterol controlled with medications as prescribed, with individualized exercise RX and with personalized nutrition plan. Value goals: LDL < 70mg , HDL > 40 mg.             Core Components/Risk Factors/Patient Goals Review:   Goals and Risk Factor Review     Row Name 11/18/22 1717 11/27/22 0947 12/23/22 0934 12/24/22 1340       Core Components/Risk Factors/Patient Goals Review   Personal Goals  Review Weight Management/Obesity;Hypertension;Lipids;Diabetes Weight Management/Obesity;Hypertension;Lipids;Diabetes Weight Management/Obesity;Hypertension;Lipids;Diabetes Weight Management/Obesity;Hypertension;Lipids;Diabetes    Review Khaliq started intensive cardiac rehab on 11/18/22. Hendryx did well with exercise he did report some fatigue on the nustep. Vital sigs stable. CBG's in the 200's. Rontrell is doing well with exercise at  intensive cardiac rehab. vitals signs have been stable. Some variations noted with CBG's will continue to monitor Johnanthony's exercise is currently on hold due to current hospitalization Delman's exercise is currently on hold due to current hospitalization. He is down 2.9 kg from start of rehab.    Expected Outcomes Malin will continue to participate in intensive cardiac rehab for exercise, nutrition and lifstyle modifications Leonce will continue  to participate in intensive cardiac rehab for exercise, nutrition and lifstyle modifications Harman will continue to participate in intensive cardiac rehab for exercise, nutrition and lifstyle modifications Nikki will continue to participate in intensive cardiac rehab for exercise, nutrition and lifstyle modifications             Core Components/Risk Factors/Patient Goals at Discharge (Final Review):   Goals and Risk Factor Review - 12/24/22 1340       Core Components/Risk Factors/Patient Goals Review   Personal Goals Review Weight Management/Obesity;Hypertension;Lipids;Diabetes    Review Jimmylee's exercise is currently on hold due to current hospitalization. He is down 2.9 kg from start of rehab.    Expected Outcomes Reynald will continue to participate in intensive cardiac rehab for exercise, nutrition and lifstyle modifications             ITP Comments:  ITP Comments     Row Name 11/14/22 0806 11/18/22 1713 11/27/22 0945 12/23/22 0922 12/24/22 1338   ITP Comments Armanda Magic, MD: Medical Director. Introduction to pritikin education program/ intensive cardiac rehab. Initial orientation packet reviewed with patient. 30 Day ITP Review. Robertson started intensive cardiac rehab on 11/18/22 and did well with exercise 30 Day ITP Review. Jovi has good attendance and participation in  intensive cardiac rehab. Kingslee is off to a good start to exercise 30 Day ITP Review. Kieth is currently admitted to the hospital. Exercise is currently on hold 30-Day ITP Review. Kyrell is currently admitted to the hospital. Exercise is currently on hold.            Comments: See ITP comments.

## 2022-12-24 NOTE — Telephone Encounter (Signed)
This patient with complex Crohn's disease was seen in the office late last week with a recurrent buttock/perineal abscess.  He was seen at the surgical office later the same day and admitted to the hospital for surgical incision and drainage with antibiotics.  Please contact him or his wife with the following information:   - We can cancel the pelvic MRI that was scheduled at the office visit because he had a CT scan last week that sufficiently defined of the scope with the problem.   - I am waiting for his Humira drug and antibody levels to come back to make a final decision, but there is a good chance we will change him to another medicine (possibly [employer]).  More info to come.   - I want to refer him to specialty consultation at the [facility] GI department, and we had discussed at the office visit this would likely be the case.  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  His name is Dr.Hans Herfarth, and I would like their evaluation and perhaps even evaluation by the IBD surgeons there as well.  I will email Dr. Gwenith Spitz to help navigate that, but please send a referral to that department.  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  HD

## 2022-12-24 NOTE — Discharge Instructions (Signed)

## 2022-12-24 NOTE — Plan of Care (Signed)
Problem: Education: Goal: Ability to describe self-care measures that may prevent or decrease complications (Diabetes Survival Skills Education) will improve 12/24/2022 1314 by Letta Moynahan, RN Outcome: Adequate for Discharge 12/24/2022 0755 by Letta Moynahan, RN Outcome: Progressing Goal: Individualized Educational Video(s) 12/24/2022 1314 by Letta Moynahan, RN Outcome: Adequate for Discharge 12/24/2022 0755 by Letta Moynahan, RN Outcome: Progressing   Problem: Coping: Goal: Ability to adjust to condition or change in health will improve 12/24/2022 1314 by Letta Moynahan, RN Outcome: Adequate for Discharge 12/24/2022 0755 by Letta Moynahan, RN Outcome: Progressing   Problem: Fluid Volume: Goal: Ability to maintain a balanced intake and output will improve 12/24/2022 1314 by Letta Moynahan, RN Outcome: Adequate for Discharge 12/24/2022 0755 by Letta Moynahan, RN Outcome: Progressing   Problem: Health Behavior/Discharge Planning: Goal: Ability to identify and utilize available resources and services will improve 12/24/2022 1314 by Letta Moynahan, RN Outcome: Adequate for Discharge 12/24/2022 0755 by Letta Moynahan, RN Outcome: Progressing Goal: Ability to manage health-related needs will improve 12/24/2022 1314 by Letta Moynahan, RN Outcome: Adequate for Discharge 12/24/2022 0755 by Letta Moynahan, RN Outcome: Progressing   Problem: Metabolic: Goal: Ability to maintain appropriate glucose levels will improve 12/24/2022 1314 by Letta Moynahan, RN Outcome: Adequate for Discharge 12/24/2022 0755 by Letta Moynahan, RN Outcome: Progressing   Problem: Nutritional: Goal: Maintenance of adequate nutrition will improve 12/24/2022 1314 by Letta Moynahan, RN Outcome: Adequate for Discharge 12/24/2022 0755 by Letta Moynahan, RN Outcome: Progressing Goal: Progress toward achieving an optimal weight will improve 12/24/2022 1314 by Letta Moynahan, RN Outcome: Adequate for Discharge 12/24/2022 0755 by Letta Moynahan, RN Outcome: Progressing   Problem: Skin Integrity: Goal: Risk for impaired skin integrity will decrease 12/24/2022 1314 by Letta Moynahan, RN Outcome: Adequate for Discharge 12/24/2022 0755 by Letta Moynahan, RN Outcome: Progressing   Problem: Tissue Perfusion: Goal: Adequacy of tissue perfusion will improve 12/24/2022 1314 by Letta Moynahan, RN Outcome: Adequate for Discharge 12/24/2022 0755 by Letta Moynahan, RN Outcome: Progressing   Problem: Education: Goal: Knowledge of General Education information will improve Description: Including pain rating scale, medication(s)/side effects and non-pharmacologic comfort measures 12/24/2022 1314 by Letta Moynahan, RN Outcome: Adequate for Discharge 12/24/2022 0755 by Letta Moynahan, RN Outcome: Progressing   Problem: Health Behavior/Discharge Planning: Goal: Ability to manage health-related needs will improve 12/24/2022 1314 by Letta Moynahan, RN Outcome: Adequate for Discharge 12/24/2022 0755 by Letta Moynahan, RN Outcome: Progressing   Problem: Clinical Measurements: Goal: Ability to maintain clinical measurements within normal limits will improve 12/24/2022 1314 by Letta Moynahan, RN Outcome: Adequate for Discharge 12/24/2022 0755 by Letta Moynahan, RN Outcome: Progressing Goal: Will remain free from infection 12/24/2022 1314 by Letta Moynahan, RN Outcome: Adequate for Discharge 12/24/2022 0755 by Letta Moynahan, RN Outcome: Progressing Goal: Diagnostic test results will improve 12/24/2022 1314 by Letta Moynahan, RN Outcome: Adequate for Discharge 12/24/2022 0755 by Letta Moynahan, RN Outcome: Progressing Goal: Respiratory complications will improve 12/24/2022 1314 by Letta Moynahan, RN Outcome: Adequate for Discharge 12/24/2022 0755 by Letta Moynahan, RN Outcome: Progressing Goal: Cardiovascular complication will be avoided 12/24/2022 1314 by Letta Moynahan, RN Outcome: Adequate for Discharge 12/24/2022 0755 by Letta Moynahan, RN Outcome:  Progressing   Problem: Activity: Goal: Risk for activity intolerance will decrease 12/24/2022 1314 by Letta Moynahan, RN Outcome: Adequate for Discharge  12/24/2022 0755 by Letta Moynahan, RN Outcome: Progressing   Problem: Nutrition: Goal: Adequate nutrition will be maintained 12/24/2022 1314 by Letta Moynahan, RN Outcome: Adequate for Discharge 12/24/2022 0755 by Letta Moynahan, RN Outcome: Progressing   Problem: Coping: Goal: Level of anxiety will decrease 12/24/2022 1314 by Letta Moynahan, RN Outcome: Adequate for Discharge 12/24/2022 0755 by Letta Moynahan, RN Outcome: Progressing   Problem: Elimination: Goal: Will not experience complications related to bowel motility 12/24/2022 1314 by Letta Moynahan, RN Outcome: Adequate for Discharge 12/24/2022 0755 by Letta Moynahan, RN Outcome: Progressing Goal: Will not experience complications related to urinary retention 12/24/2022 1314 by Letta Moynahan, RN Outcome: Adequate for Discharge 12/24/2022 0755 by Letta Moynahan, RN Outcome: Progressing   Problem: Pain Managment: Goal: General experience of comfort will improve 12/24/2022 1314 by Letta Moynahan, RN Outcome: Adequate for Discharge 12/24/2022 0755 by Letta Moynahan, RN Outcome: Progressing   Problem: Safety: Goal: Ability to remain free from injury will improve 12/24/2022 1314 by Letta Moynahan, RN Outcome: Adequate for Discharge 12/24/2022 0755 by Letta Moynahan, RN Outcome: Progressing   Problem: Skin Integrity: Goal: Risk for impaired skin integrity will decrease 12/24/2022 1314 by Letta Moynahan, RN Outcome: Adequate for Discharge 12/24/2022 0755 by Letta Moynahan, RN Outcome: Progressing

## 2022-12-24 NOTE — Telephone Encounter (Signed)
Lm on vm for patient to return call.   Pelvic MRI appt cancelled.  Referral, records, pt's demographic, and insurance have been faxed to Dr. Britt Bolognese at [facility] (P: [phone], F: [phone]).

## 2022-12-24 NOTE — Progress Notes (Signed)
   12/24/22 0981  Vitals  BP (!) 182/85 Ulla Gallo, NP order to go ahead and give Coreg 8am dose.)  MAP (mmHg) 111  BP Location Right Arm  BP Method Automatic  Patient Position (if appropriate) Lying  Pulse Rate 75  MEWS COLOR  MEWS Score Color Green  MEWS Score  MEWS Temp 0  MEWS Systolic 0  MEWS Pulse 0  MEWS RR 0  MEWS LOC 0  MEWS Score 0

## 2022-12-25 ENCOUNTER — Encounter (HOSPITAL_COMMUNITY): Payer: 59

## 2022-12-27 ENCOUNTER — Encounter (HOSPITAL_COMMUNITY): Payer: 59

## 2022-12-27 ENCOUNTER — Telehealth (HOSPITAL_COMMUNITY): Payer: Self-pay | Admitting: *Deleted

## 2022-12-27 LAB — INFLIXIMAB+AB (SERIAL MONITOR)

## 2022-12-27 NOTE — Telephone Encounter (Signed)
Patient called stating he has been trying to reach you since last week. I advised him you have tried calling him and left several voicemail's.  Patient asked if you can call him again.

## 2022-12-27 NOTE — Telephone Encounter (Signed)
Left message to call cardiac rehab.Tadarrius Burch Walden Sareena Odeh RN BSN  

## 2022-12-27 NOTE — Telephone Encounter (Signed)
2nd attempt to reach patient - Lm on vm for patient to return call. 

## 2022-12-28 LAB — INFLIXIMAB+AB (SERIAL MONITOR): Infliximab Drug Level: 23 ug/mL

## 2022-12-30 ENCOUNTER — Encounter (HOSPITAL_COMMUNITY): Payer: 59

## 2022-12-30 ENCOUNTER — Ambulatory Visit (HOSPITAL_COMMUNITY): Payer: 59

## 2022-12-30 ENCOUNTER — Telehealth (HOSPITAL_COMMUNITY): Payer: Self-pay | Admitting: *Deleted

## 2022-12-30 NOTE — Telephone Encounter (Signed)
Left message to call cardiac rehab.Janiyla Long Walden Deby Adger RN BSN  

## 2022-12-31 ENCOUNTER — Telehealth (HOSPITAL_COMMUNITY): Payer: Self-pay | Admitting: *Deleted

## 2022-12-31 ENCOUNTER — Telehealth: Payer: Self-pay | Admitting: Gastroenterology

## 2022-12-31 NOTE — Telephone Encounter (Signed)
Spoke with Sean Schaefer he will not be returning to cardiac rehab due to his recent GI issues and  recent hospitalizations. Will discharge from cardiac rehab at this time. Christy says he may be interested in returning to cardiac rehab at a later date.Thayer Headings RN BSN

## 2022-12-31 NOTE — Telephone Encounter (Signed)
Thank you for the update.  I have placed the infusion center orders for the 3 IV Skyrizi treatments.  The subcutaneous maintenance dosing will be 180 mg, starting 4 weeks after the third IV induction dose and continuing every 8 weeks thereafter.   - HD

## 2022-12-31 NOTE — Telephone Encounter (Signed)
See alternate telephone encounter for details.  ?

## 2022-12-31 NOTE — Telephone Encounter (Signed)
Error

## 2022-12-31 NOTE — Telephone Encounter (Signed)
(  Please talk to this patient or his wife today)  I received and reviewed all the recent lab work.  The level of azathioprine metabolites shows that he would need a higher dose to get a therapeutic level, but we will be discontinuing that medication.  I believe he had already run out of it by the time I saw him at the recent visit.  The infliximab level is well into the therapeutic range, he does not have antibodies to it.  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  I did communicate with Dr. Britt Bolognese of the [facility] GI department, and he is agreeable to seeing [facility].  However, their clinic is booked out months for new appointments.  So in the meantime, my recommendation is to use stop the infliximab (and you can cancel the upcoming infusion that I believe is scheduled for 01/02/2023, continue close follow-up with Dr. Cliffton Asters at [facility] for reevaluation and treatment of the abscess, and change his treatment to [facility].   We need to start working on Therapist, occupational for [employer] at standard dose.  If this requires placement of orders at the infusion center, then please let me know and I will attend to it. Even in that case, I do not know if we will need separate insurance approval for the maintenance subcutaneous treatments after the initial IV treatments.  Sy can do some more reading about Skyrizi on their website or also at the [employer] and colitis foundation website(www.ccfa.org), and I would like to see him in the office in 4 to 6 weeks. (He could be a 4 PM clinic add-on on 02/11/2023 if convenient)  -  HD

## 2022-12-31 NOTE — Addendum Note (Signed)
Encounter addended by: Cammy Copa, RN on: 12/31/2022 9:38 AM  Actions taken: Pend clinical note

## 2022-12-31 NOTE — Addendum Note (Signed)
Encounter addended by: Lorin Picket on: 12/31/2022 9:31 AM  Actions taken: Flowsheet data copied forward, Flowsheet accepted

## 2022-12-31 NOTE — Telephone Encounter (Signed)
This patient with complex Crohn's disease was seen in the office late last week with a recurrent buttock/perineal abscess.  He was seen at the surgical office later the same day and admitted to the hospital for surgical incision and drainage with antibiotics.  Please contact him or his wife with the following information:   - We can cancel the pelvic MRI that was scheduled at the office visit because he had a CT scan last week that sufficiently defined of the scope with the problem.   - I am waiting for his Humira drug and antibody levels to come back to make a final decision, but there is a good chance we will change him to another medicine (possibly Skyrizi).  More info to come.   - I want to refer him to specialty consultation at the [facility] GI department, and we had discussed at the office visit this would likely be the case.  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  His name is Dr.Hans Herfarth, and I would like their evaluation and perhaps even evaluation by the IBD surgeons there as well.  I will email Dr. Herfarth to help navigate that, but please send a referral to that department.  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  HD 

## 2022-12-31 NOTE — Progress Notes (Signed)
Discharge Progress Report  Patient Details  Name: Sean Schaefer MRN: [id number] Date of Birth: 04-Feb-1966 Referring Provider:   Flowsheet Row INTENSIVE CARDIAC REHAB ORIENT from 11/14/2022 in [facility]  Referring Provider Bryan Lemma, MD        Number of Visits: 20  Reason for Discharge:  Early Exit:  medical issues  Smoking History:  Social History   Tobacco Use  Smoking Status Never  Smokeless Tobacco Never    Diagnosis:  10/20/22 S/P drug eluting coronary stent placement  10/19/22 NSTEMI (non-ST elevated myocardial infarction)  ADL UCSD:   Initial Exercise Prescription:  Initial Exercise Prescription - 11/14/22 1300       Date of Initial Exercise RX and Referring Provider   Date 11/14/22    Referring Provider Bryan Lemma, MD    Expected Discharge Date 01/25/23      Recumbant Bike   Level 2.2    RPM 60    Watts 25    Minutes 15    METs 4.6      NuStep   Level 2    SPM 80    Minutes 15    METs 4.6      Prescription Details   Frequency (times per week) 3    Duration Progress to 30 minutes of continuous aerobic without signs/symptoms of physical distress      Intensity   THRR 40-80% of Max Heartrate 66-133    Ratings of Perceived Exertion 11-13    Perceived Dyspnea 0-4      Progression   Progression Continue progressive overload as per policy without signs/symptoms or physical distress.      Resistance Training   Training Prescription Yes    Weight 4 lbs    Reps 10-15             Discharge Exercise Prescription (Final Exercise Prescription Changes):  Exercise Prescription Changes - 12/09/22 1030       Response to Exercise   Blood Pressure (Admit) 109/77    Blood Pressure (Exercise) 160/86    Blood Pressure (Exit) 87/62    Heart Rate (Admit) 94 bpm    Heart Rate (Exercise) 117 bpm    Heart Rate (Exit) 100 bpm    Rating of Perceived Exertion (Exercise) 10    Symptoms  Hypotension    Comments Pt's last day to exercise in the CRP2 program    Duration Continue with 30 min of aerobic exercise without signs/symptoms of physical distress.    Intensity THRR unchanged      Progression   Progression Continue to progress workloads to maintain intensity without signs/symptoms of physical distress.    Average METs 3.1      Resistance Training   Training Prescription Yes    Weight 4 lbs    Reps 10-15    Time 10 Minutes      Interval Training   Interval Training No      Recumbant Bike   Level 3    Watts 41    Minutes 15    METs 3.2      NuStep   Level 3    SPM 107    Minutes 15    METs 2.9             Functional Capacity:  6 Minute Walk     Row Name 11/14/22 0903         6 Minute Walk   Phase Initial  Distance 1708 feet     Walk Time 6 minutes     # of Rest Breaks 0     MPH 3.23     METS 4.63     RPE 8     Perceived Dyspnea  0     VO2 Peak 16.22     Symptoms No     Resting HR 86 bpm     Resting BP 110/72     Resting Oxygen Saturation  100 %     Exercise Oxygen Saturation  during 6 min walk 100 %     Max Ex. HR 118 bpm     Max Ex. BP 138/80     2 Minute Post BP 114/70              Psychological, QOL, Others - Outcomes: PHQ 2/9:    11/14/2022   10:11 AM 09/04/2018   11:19 AM 08/24/2018    3:30 PM 07/08/2018    8:33 AM 03/10/2018    3:38 PM  Depression screen PHQ 2/9  Decreased Interest 0 0 0 0 0  Down, Depressed, Hopeless 0 0 0 0 0  PHQ - 2 Score 0 0 0 0 0  Altered sleeping 3      Tired, decreased energy 0      Change in appetite 0      Feeling bad or failure about yourself  0      Trouble concentrating 0      Moving slowly or fidgety/restless 0      Suicidal thoughts 0      PHQ-9 Score 3      Difficult doing work/chores Not difficult at all        Quality of Life:  Quality of Life - 11/14/22 1316       Quality of Life   Select Quality of Life      Quality of Life Scores   Health/Function Pre 27.11  %    Socioeconomic Pre 28 %    Psych/Spiritual Pre 27.86 %    Family Pre 25.8 %    GLOBAL Pre 27.84 %             Personal Goals: Goals established at orientation with interventions provided to work toward goal.  Personal Goals and Risk Factors at Admission - 11/14/22 1015       Core Components/Risk Factors/Patient Goals on Admission    Weight Management Yes;Weight Loss    Intervention Weight Management: Develop a combined nutrition and exercise program designed to reach desired caloric intake, while maintaining appropriate intake of nutrient and fiber, sodium and fats, and appropriate energy expenditure required for the weight goal.;Weight Management: Provide education and appropriate resources to help participant work on and attain dietary goals.;Weight Management/Obesity: Establish reasonable short term and long term weight goals.    Admit Weight 206 lb 12.7 oz (93.8 kg)    Goal Weight: Long Term 190 lb (86.2 kg)   Pt goal   Expected Outcomes Short Term: Continue to assess and modify interventions until short term weight is achieved;Long Term: Adherence to nutrition and physical activity/exercise program aimed toward attainment of established weight goal;Weight Loss: Understanding of general recommendations for a balanced deficit meal plan, which promotes 1-2 lb weight loss per week and includes a negative energy balance of [phone] kcal/d;Understanding recommendations for meals to include 15-35% energy as protein, 25-35% energy from fat, 35-60% energy from carbohydrates, less than 200mg  of dietary cholesterol, 20-35 gm of total fiber daily;Understanding of  distribution of calorie intake throughout the day with the consumption of 4-5 meals/snacks    Diabetes Yes    Intervention Provide education about signs/symptoms and action to take for hypo/hyperglycemia.;Provide education about proper nutrition, including hydration, and aerobic/resistive exercise prescription along with prescribed  medications to achieve blood glucose in normal ranges: Fasting glucose 65-99 mg/dL    Expected Outcomes Short Term: Participant verbalizes understanding of the signs/symptoms and immediate care of hyper/hypoglycemia, proper foot care and importance of medication, aerobic/resistive exercise and nutrition plan for blood glucose control.;Long Term: Attainment of HbA1C < 7%.    Hypertension Yes    Intervention Provide education on lifestyle modifcations including regular physical activity/exercise, weight management, moderate sodium restriction and increased consumption of fresh fruit, vegetables, and low fat dairy, alcohol moderation, and smoking cessation.;Monitor prescription use compliance.    Expected Outcomes Short Term: Continued assessment and intervention until BP is < 140/7mm HG in hypertensive participants. < 130/74mm HG in hypertensive participants with diabetes, heart failure or chronic kidney disease.;Long Term: Maintenance of blood pressure at goal levels.    Lipids Yes    Intervention Provide education and support for participant on nutrition & aerobic/resistive exercise along with prescribed medications to achieve LDL 70mg , HDL >40mg .    Expected Outcomes Short Term: Participant states understanding of desired cholesterol values and is compliant with medications prescribed. Participant is following exercise prescription and nutrition guidelines.;Long Term: Cholesterol controlled with medications as prescribed, with individualized exercise RX and with personalized nutrition plan. Value goals: LDL < 70mg , HDL > 40 mg.              Personal Goals Discharge:  Goals and Risk Factor Review     Row Name 11/18/22 1717 11/27/22 0947 12/23/22 0934 12/24/22 1340       Core Components/Risk Factors/Patient Goals Review   Personal Goals Review Weight Management/Obesity;Hypertension;Lipids;Diabetes Weight Management/Obesity;Hypertension;Lipids;Diabetes Weight  Management/Obesity;Hypertension;Lipids;Diabetes Weight Management/Obesity;Hypertension;Lipids;Diabetes    Review [place] started intensive cardiac rehab on 11/18/22. Cy did well with exercise he did report some fatigue on the nustep. Vital sigs stable. CBG's in the 200's. Duston is doing well with exercise at  intensive cardiac rehab. vitals signs have been stable. Some variations noted with CBG's will continue to monitor Alif's exercise is currently on hold due to current hospitalization Javarie's exercise is currently on hold due to current hospitalization. He is down 2.9 kg from start of rehab.    Expected Outcomes Haden will continue to participate in intensive cardiac rehab for exercise, nutrition and lifstyle modifications Caleb will continue to participate in intensive cardiac rehab for exercise, nutrition and lifstyle modifications Prem will continue to participate in intensive cardiac rehab for exercise, nutrition and lifstyle modifications Vartan will continue to participate in intensive cardiac rehab for exercise, nutrition and lifstyle modifications             Exercise Goals and Review:  Exercise Goals     Row Name 11/14/22 1321             Exercise Goals   Increase Physical Activity Yes       Expected Outcomes Short Term: Attend rehab on a regular basis to increase amount of physical activity.;Long Term: Add in home exercise to make exercise part of routine and to increase amount of physical activity.;Long Term: Exercising regularly at least 3-5 days a week.       Increase Strength and Stamina Yes       Intervention Provide advice, education, support and counseling about physical activity/exercise needs.;Develop  an individualized exercise prescription for aerobic and resistive training based on initial evaluation findings, risk stratification, comorbidities and participant's personal goals.       Expected Outcomes Short Term: Increase workloads from initial exercise  prescription for resistance, speed, and METs.;Short Term: Perform resistance training exercises routinely during rehab and add in resistance training at home;Long Term: Improve cardiorespiratory fitness, muscular endurance and strength as measured by increased METs and functional capacity ( )       Able to understand and use rate of perceived exertion (RPE) scale Yes       Intervention Provide education and explanation on how to use RPE scale       Expected Outcomes Short Term: Able to use RPE daily in rehab to express subjective intensity level;Long Term:  Able to use RPE to guide intensity level when exercising independently       Knowledge and understanding of Target Heart Rate Range (THRR) Yes       Intervention Provide education and explanation of THRR including how the numbers were predicted and where they are located for reference       Expected Outcomes Short Term: Able to state/look up THRR;Long Term: Able to use THRR to govern intensity when exercising independently;Short Term: Able to use daily as guideline for intensity in rehab       Understanding of Exercise Prescription Yes       Intervention Provide education, explanation, and written materials on patient's individual exercise prescription       Expected Outcomes Short Term: Able to explain program exercise prescription;Long Term: Able to explain home exercise prescription to exercise independently                Exercise Goals Re-Evaluation:  Exercise Goals Re-Evaluation     Row Name 11/18/22 1206             Exercise Goal Re-Evaluation   Exercise Goals Review Increase Physical Activity;Increase Strength and Stamina;Able to understand and use rate of perceived exertion (RPE) scale;Knowledge and understanding of Target Heart Rate Range (THRR);Understanding of Exercise Prescription       Comments Pt's first day in the CRP2 program. Pt understands the exercise Rx, RPE sclae and THRR.       Expected Outcomes Will continue  to monitor patient and progress exercise workloads as tolerated.                Nutrition & Weight - Outcomes:  Pre Biometrics - 11/14/22 0815       Pre Biometrics   Waist Circumference 41.5 inches    Hip Circumference 41.5 inches    Waist to Hip Ratio 1 %    Triceps Skinfold 12 mm    % Body Fat 27.4 %    Grip Strength 42 kg    Flexibility 12.5 in    Single Leg Stand 10.68 seconds              Nutrition:  Nutrition Therapy & Goals - 12/19/22 0845       Nutrition Therapy   Diet Heart Healthy/Carbohydrate Consistent Diet    Drug/Food Interactions Statins/Certain Fruits      Personal Nutrition Goals   Nutrition Goal Patient to identify strategies for reducing cardiovascular risk by attending the Pritikin education and nutrition series weekly.    Personal Goal #2 Patient to improve diet quality by using the plate method as a guide for meal planning to include lean protein/plant protein, fruits, vegetables, whole grains, nonfat dairy as part of  a well-balanced diet.    Personal Goal #3 Patient to identify strategies for improving blood sugar control with goal A1c <7%    Comments Andrewjames has not attended cardiac rehab since 5/20 due to hospitalization; he is awaiting clearance to come back. Prior to his absences, he has been attending the Pritikin education and nutrition series regularly. Keita reported taking jardiance and using the humalog sliding scale regiment for blood sugar management. Previously recommended follow-up with endocrinology as patient had not been taking tresiba. He has started making some dietary changes including increasing lean protein and increasing vegetable and fruit intake.  He will benefit from participation in intensive cardiac rehab for nutrition, exercise, and lifestyle modification.      Intervention Plan   Intervention Prescribe, educate and counsel regarding individualized specific dietary modifications aiming towards targeted core components  such as weight, hypertension, lipid management, diabetes, heart failure and other comorbidities.;Nutrition handout(s) given to patient.    Expected Outcomes Short Term Goal: Understand basic principles of dietary content, such as calories, fat, sodium, cholesterol and nutrients.;Long Term Goal: Adherence to prescribed nutrition plan.             Nutrition Discharge:  Nutrition Assessments - 11/21/22 1625       Rate Your Plate Scores   Pre Score 62             Education Questionnaire Score:  Knowledge Questionnaire Score - 11/14/22 1017       Knowledge Questionnaire Score   Pre Score 20/24             Goals reviewed with patient; copy given to patient.Pt graduates from  Intensive/Traditional cardiac rehab program today with completion of  20 exercise and education sessions. Pt maintained good attendance and progressed nicely during their participation in rehab until [place] had issues with Chron's resulting in 2 recent hospitalizations. Rudis has been discharged from cardiac rehab and may return to complete the program later. We enjoyed working with [employer] while he participated in the program.Erla Bacchi Mila Palmer [employer]

## 2022-12-31 NOTE — Telephone Encounter (Signed)
Called and spoke with patient's wife, [place] Sink, regarding all recommendations and next steps regarding pt's care. [place] Sink is aware that MRI pelvis was cancelled. Pt already has a follow up with [facility] on 01/09/23. [place] Sink is aware that we have placed referral to [facility] for patient and they should receive a call within the next week to set up patient's appt with Dr. Britt Bolognese. [place] Sink is aware that based on recent labs patient should remain off of Azathioprine and he will need to discontinue Remicade infusions as well since they are not working to control his Crohn's disease. [place] Sink has been advised that Cristy Folks is the recommended treatment at this time for patient. We will have [place] infusion center work on authorization for induction infusions and pharmacy team will work on PA for maintenance dose. I informed [place] Sink that patient can find more information about the medication online. Otherwise, pt has been scheduled for a follow up with Dr. Myrtie Neither on 02/11/23 at 4 pm, arriving by 3:45 pm. [place] Sink verbalized understanding of all information and had no concerns at the end of the call.   Called [facility] to follow up on referral. I was informed that referral was received, and was sent to review yesterday. Review takes 3-5 business days and they will contact patient to schedule.

## 2023-01-01 ENCOUNTER — Telehealth: Payer: Self-pay | Admitting: Pharmacy Technician

## 2023-01-01 ENCOUNTER — Encounter (HOSPITAL_COMMUNITY): Payer: 59

## 2023-01-01 NOTE — Telephone Encounter (Addendum)
[place],  Auth Submission: APPROVED Site of care: Site of care: [facility] Payer: [employer] Medication & CPT/J Code(s) submitted: Skyrizi Merlyn Albert) [phone] Route of submission (phone, fax, portal):  Phone #[phone] Fax #[phone] Auth type: Buy/Bill Units/visits requested: X3 Reference number: [id number] Approval from:  01/14/23 - 04/14/23  Patient will be scheduled as soon as possible.  @[email or web address] , please start auth the [employer] on-body injector.  Thanks.  Skyrizi co-pay card: CARD # [id number] [id number] PCN: OHCP ID; [id number] BIN: [id number] EXP: [id number] CVV: [id number] Phone: [phone]

## 2023-01-02 ENCOUNTER — Ambulatory Visit: Payer: 59

## 2023-01-02 DIAGNOSIS — I2510 Atherosclerotic heart disease of native coronary artery without angina pectoris: Secondary | ICD-10-CM | POA: Diagnosis not present

## 2023-01-02 DIAGNOSIS — I10 Essential (primary) hypertension: Secondary | ICD-10-CM | POA: Diagnosis not present

## 2023-01-02 DIAGNOSIS — K610 Anal abscess: Secondary | ICD-10-CM | POA: Diagnosis not present

## 2023-01-02 DIAGNOSIS — E1165 Type 2 diabetes mellitus with hyperglycemia: Secondary | ICD-10-CM | POA: Diagnosis not present

## 2023-01-02 DIAGNOSIS — K5090 Crohn's disease, unspecified, without complications: Secondary | ICD-10-CM | POA: Diagnosis not present

## 2023-01-02 DIAGNOSIS — N179 Acute kidney failure, unspecified: Secondary | ICD-10-CM | POA: Diagnosis not present

## 2023-01-03 ENCOUNTER — Encounter (HOSPITAL_COMMUNITY): Payer: 59

## 2023-01-06 ENCOUNTER — Encounter (HOSPITAL_COMMUNITY): Payer: 59

## 2023-01-08 ENCOUNTER — Encounter (HOSPITAL_COMMUNITY): Payer: 59

## 2023-01-09 DIAGNOSIS — K611 Rectal abscess: Secondary | ICD-10-CM | POA: Diagnosis not present

## 2023-01-09 DIAGNOSIS — Z9889 Other specified postprocedural states: Secondary | ICD-10-CM | POA: Diagnosis not present

## 2023-01-09 DIAGNOSIS — K5000 Crohn's disease of small intestine without complications: Secondary | ICD-10-CM | POA: Diagnosis not present

## 2023-01-10 ENCOUNTER — Encounter (HOSPITAL_COMMUNITY): Payer: 59

## 2023-01-13 ENCOUNTER — Encounter (HOSPITAL_COMMUNITY): Payer: 59

## 2023-01-14 ENCOUNTER — Telehealth: Payer: Self-pay | Admitting: Cardiology

## 2023-01-14 NOTE — Telephone Encounter (Signed)
   Pre-operative Risk Assessment    Patient Name: Sean Schaefer  DOB: [birth year] MRN: [id number]     Request for Surgical Clearance    Procedure:   Cleaning and fillings    Date of Surgery:  Clearance TBD                                 Surgeon:  Dr. Ursula Beath  Surgeon's Group or Practice Name:  [facility]  Phone number:  [phone] Fax number:  [phone]   Type of Clearance Requested:   - Medical  - Pharmacy:  Hold Ticagrelor (Brilinta) Need to know if needs to be held and for how long    Type of Anesthesia:  Local    Additional requests/questions:  Does this patient need antibiotics?  Signed, April Henson   01/14/2023, 3:43 PM

## 2023-01-14 NOTE — Telephone Encounter (Signed)
PT is calling to get an update on the [place] PA. PT requesting call back

## 2023-01-14 NOTE — Telephone Encounter (Signed)
    Primary Cardiologist: Bryan Lemma, MD  Chart reviewed as part of pre-operative protocol coverage. Simple dental extractions are considered low risk procedures per guidelines and generally do not require any specific cardiac clearance. It is also generally accepted that for simple extractions and dental cleanings, there is no need to interrupt blood thinner therapy.   SBE prophylaxis is not required for the patient.  I will route this recommendation to the requesting party via Epic fax function and remove from pre-op pool.  Please call with questions.  Ronney Asters, NP 01/14/2023, 4:09 PM

## 2023-01-15 ENCOUNTER — Encounter: Payer: Self-pay | Admitting: Gastroenterology

## 2023-01-15 ENCOUNTER — Encounter (HOSPITAL_COMMUNITY): Payer: 59

## 2023-01-15 NOTE — Telephone Encounter (Signed)
[place], I have spoken with patient and he is aware his Berkley Harvey is still pending.  We will contact patient once we have a response from insurance.  Thanks Selena Batten

## 2023-01-17 ENCOUNTER — Encounter (HOSPITAL_COMMUNITY): Payer: 59

## 2023-01-20 ENCOUNTER — Ambulatory Visit (INDEPENDENT_AMBULATORY_CARE_PROVIDER_SITE_OTHER): Payer: 59

## 2023-01-20 ENCOUNTER — Other Ambulatory Visit (HOSPITAL_COMMUNITY): Payer: Self-pay

## 2023-01-20 ENCOUNTER — Encounter (HOSPITAL_COMMUNITY): Payer: 59

## 2023-01-20 ENCOUNTER — Other Ambulatory Visit: Payer: Self-pay

## 2023-01-20 VITALS — BP 151/85 | HR 90 | Temp 98.4°F | Resp 18 | Ht 70.0 in | Wt 204.0 lb

## 2023-01-20 DIAGNOSIS — K50114 Crohn's disease of large intestine with abscess: Secondary | ICD-10-CM

## 2023-01-20 MED ORDER — SKYRIZI 180 MG/1.2ML ~~LOC~~ SOCT
1.2000 mL | SUBCUTANEOUS | 5 refills | Status: DC
  Filled 2023-01-20: qty 1.2, fill #0

## 2023-01-20 MED ORDER — RISANKIZUMAB-RZAA 600 MG/10ML IV SOLN
600.0000 mg | Freq: Once | INTRAVENOUS | Status: AC
  Administered 2023-01-20: 600 mg via INTRAVENOUS
  Filled 2023-01-20: qty 10

## 2023-01-20 NOTE — Progress Notes (Signed)
Diagnosis: Crohn's Disease  Provider:  Chilton Greathouse MD  Procedure: IV Infusion  IV Type: Peripheral, IV Location: L Hand  Skyrizi (risankizumab-rzaa), Dose: 600 mg  Infusion Start Time: 1445  Infusion Stop Time: 1550  Post Infusion IV Care: Observation period completed and Peripheral IV Discontinued  Discharge: Condition: Good, Destination: Home . AVS Declined  Performed by:  Loney Hering, LPN

## 2023-01-20 NOTE — Addendum Note (Signed)
Addended by: Richardson Chiquito on: 01/20/2023 04:09 PM   Modules accepted: Orders

## 2023-01-20 NOTE — Telephone Encounter (Signed)
1 injector per 56 days has been approved from 01/20/23-07/19/23. Injector rx has been sent to [facility] pharmacy for fulfillment on 04/14/23 (week 12, week 20, week 28...).

## 2023-01-20 NOTE — Telephone Encounter (Signed)
Sean Schaefer (Key: [id number]) PA Case ID #: [id number] Need Help? Call us at [phone] Status sent iconSent to Plan today Drug Skyrizi 180MG /1.2ML (150MG /ML) single-dose prefilled cartridge with on-body injector ePA cloud logo Form [employer] ePA Form 2017 NCP[employer]

## 2023-01-20 NOTE — Telephone Encounter (Signed)
The authorization is effective from 01/20/2023 to 07/19/2023, as long as the member is enrolled in their current health plan. This request is approved for 1.65mL (1 cartridge) per 56 days.We were asked to cover a larger amount of the drug listed at the top of this letter than allowed under our quantity limit rule. Your provider requested a quantity of two (2) milliliters of Skyrizi 180mg /1.36mL On-Body per 56-day supply, but your plan allows for a quantity of 1.2 milliliters of Skyrizi 180mg /1.74mL On-Body per 56-day supply for the treatment of Crohns disease (a type of digestive disorder).You were approved for a lower quantity than requested based on our guideline named GENERAL QUANTITY EXCEPTION CRITERIA (reviewed for of Skyrizi 180mg /1.70mL On-Body). In order for this request to be approved at the quantity requested, your provider would need to show that you meet ONE of the following: A) You are titrating the daily dosage of the medication up (you are slowly increasing the dose of your medication over time) to a dose that is within the Food and Drug Administration (FDA)-recommended maximum daily dose for the requested medication for your condition. B) You meet ALL of the following: a) You have tried and failed taking 1.2 milliliters of Skyrizi 180mg /1.63mL On-Body per 56-day supply and it did not work for you, unless there is a valid medical reason why you cannot. b) You have tried the highest strength available that is covered under your pharmacy benefit to achieve the same total daily dose, this is called dose consolidation. This means you must try taking 2.4 milliliters of Skyrizi 360mg /2.23mL On-Body instead of two (2) milliliters of Skyrizi 180mg /1.11mL On-Body per 56-day supply, unless there is a valid medical reason why you cannot try dose consolidation. Contact your doctor to discuss use of this medication at the quantity that has been approved and other alternatives further. Please note this medication must be  filled thr

## 2023-01-21 ENCOUNTER — Other Ambulatory Visit (HOSPITAL_COMMUNITY): Payer: Self-pay

## 2023-01-22 ENCOUNTER — Other Ambulatory Visit (HOSPITAL_COMMUNITY): Payer: Self-pay

## 2023-01-22 ENCOUNTER — Encounter (HOSPITAL_COMMUNITY): Payer: 59

## 2023-01-24 ENCOUNTER — Telehealth: Payer: Self-pay | Admitting: Pharmacist

## 2023-01-24 ENCOUNTER — Encounter (HOSPITAL_COMMUNITY): Payer: 59

## 2023-01-24 ENCOUNTER — Other Ambulatory Visit (HOSPITAL_COMMUNITY): Payer: Self-pay

## 2023-01-24 NOTE — Telephone Encounter (Signed)
Called patient to schedule an appointment for the [facility] [employer] [facility]. I was unable to reach the patient so I left a HIPAA-compliant message requesting that the patient return my call.   Luke Van Ausdall, PharmD, BCACP, CPP Clinical Pharmacist [facility] [phone]  

## 2023-01-27 ENCOUNTER — Other Ambulatory Visit: Payer: Self-pay

## 2023-02-03 ENCOUNTER — Encounter: Payer: Self-pay | Admitting: Nurse Practitioner

## 2023-02-03 ENCOUNTER — Ambulatory Visit: Payer: 59 | Attending: Nurse Practitioner | Admitting: Nurse Practitioner

## 2023-02-03 VITALS — BP 124/74 | HR 96 | Ht 70.0 in | Wt 209.4 lb

## 2023-02-03 DIAGNOSIS — I77810 Thoracic aortic ectasia: Secondary | ICD-10-CM

## 2023-02-03 DIAGNOSIS — K50919 Crohn's disease, unspecified, with unspecified complications: Secondary | ICD-10-CM | POA: Diagnosis not present

## 2023-02-03 DIAGNOSIS — E1165 Type 2 diabetes mellitus with hyperglycemia: Secondary | ICD-10-CM

## 2023-02-03 DIAGNOSIS — E785 Hyperlipidemia, unspecified: Secondary | ICD-10-CM

## 2023-02-03 DIAGNOSIS — Z794 Long term (current) use of insulin: Secondary | ICD-10-CM | POA: Diagnosis not present

## 2023-02-03 DIAGNOSIS — I10 Essential (primary) hypertension: Secondary | ICD-10-CM

## 2023-02-03 DIAGNOSIS — I2510 Atherosclerotic heart disease of native coronary artery without angina pectoris: Secondary | ICD-10-CM

## 2023-02-03 NOTE — Patient Instructions (Signed)
Medication Instructions:  Your physician recommends that you continue on your current medications as directed. Please refer to the Current Medication list given to you today.  *If you need a refill on your cardiac medications before your next appointment, please call your pharmacy*   Lab Work: Complete labs previously requested today!   Testing/Procedures: NONE ordered at this time of appointment     Follow-Up: At [facility], you and your health needs are our priority.  As part of our continuing mission to provide you with exceptional heart care, we have created designated Provider Care Teams.  These Care Teams include your primary Cardiologist (physician) and Advanced Practice Providers (APPs -  Physician Assistants and Nurse Practitioners) who all work together to provide you with the care you need, when you need it.  We recommend signing up for the patient portal called "MyChart".  Sign up information is provided on this After Visit Summary.  MyChart is used to connect with patients for Virtual Visits (Telemedicine).  Patients are able to view lab/test results, encounter notes, upcoming appointments, etc.  Non-urgent messages can be sent to your provider as well.   To learn more about what you can do with MyChart, go to [email or web address].    Your next appointment:   6 month(s)  Provider:   Bryan Lemma, MD

## 2023-02-03 NOTE — Progress Notes (Signed)
Office Visit    Patient Name: CLIFFARD HAIR Date of Encounter: 02/03/2023  Primary Care Provider:  Renford Dills, MD Primary Cardiologist:  Bryan Lemma, MD  Chief Complaint    57 year old male with a history of CAD s/p NSTEMI, DES-pLAD, PTCA-pLCx, hypertension, hyperlipidemia, Crohn's disease, and type 2 diabetes who presents for follow-up related to CAD.   Past Medical History    Past Medical History:  Diagnosis Date   CAD (coronary artery disease)    Crohn's disease (HCC)    Diabetes mellitus (HCC)    Dilation of aorta (HCC)    Hypertension    Ulcerative colitis (HCC)    Past Surgical History:  Procedure Laterality Date   BIOPSY  12/12/2022   Procedure: BIOPSY;  Surgeon: Benancio Deeds, MD;  Location: [facility];  Service: Gastroenterology;;   COLON SURGERY     COLONOSCOPY     CORONARY BALLOON ANGIOPLASTY N/A 10/20/2022   Procedure: CORONARY BALLOON ANGIOPLASTY;  Surgeon: Marykay Lex, MD;  Location: [facility] INVASIVE CV LAB;  Service: Cardiovascular;  Laterality: N/A;   CORONARY STENT INTERVENTION N/A 10/20/2022   Procedure: CORONARY STENT INTERVENTION;  Surgeon: Marykay Lex, MD;  Location: [facility] INVASIVE CV LAB;  Service: Cardiovascular;  Laterality: N/A;   ESOPHAGOGASTRODUODENOSCOPY (EGD) WITH PROPOFOL N/A 12/12/2022   Procedure: ESOPHAGOGASTRODUODENOSCOPY (EGD) WITH PROPOFOL;  Surgeon: Benancio Deeds, MD;  Location: [facility] ENDOSCOPY;  Service: Gastroenterology;  Laterality: N/A;   EYE SURGERY Left    FLEXIBLE SIGMOIDOSCOPY N/A 02/05/2021   Procedure: FLEXIBLE POUCHOSCOPY WITH BIOPSY;  Surgeon: Andria Meuse, MD;  Location: [facility] ORS;  Service: General;  Laterality: N/A;   INCISION AND DRAINAGE ABSCESS N/A 02/05/2021   Procedure: INCISION AND DRAINAGE OF PERIANAL ABSCESS;  Surgeon: Andria Meuse, MD;  Location: [facility] ORS;  Service: General;  Laterality: N/A;   INCISION AND DRAINAGE ABSCESS N/A 12/21/2022   Procedure: INCISION AND DRAINAGE ABSCESS;   Surgeon: Berna Bue, MD;  Location: [facility] OR;  Service: General;  Laterality: N/A;   INCISION AND DRAINAGE PERIRECTAL ABSCESS N/A 11/30/2020   Procedure: IRRIGATION AND DEBRIDEMENT PERIRECTAL ABSCESS;  Surgeon: Fritzi Mandes, MD;  Location: [facility] OR;  Service: General;  Laterality: N/A;   INCISION AND DRAINAGE PERIRECTAL ABSCESS Left 04/03/2021   Procedure: left peri-rectal/gluteal abscess;  Surgeon: Diamantina Monks, MD;  Location: [facility] OR;  Service: General;  Laterality: Left;   LEFT HEART CATH AND CORONARY ANGIOGRAPHY N/A 10/20/2022   Procedure: LEFT HEART CATH AND CORONARY ANGIOGRAPHY;  Surgeon: Marykay Lex, MD;  Location: [facility] INVASIVE CV LAB;  Service: Cardiovascular;  Laterality: N/A;   Perianal fistula repair  07/22/2010   PLACEMENT OF SETON N/A 02/05/2021   Procedure: PLACEMENT OF SETONS X2;  Surgeon: Andria Meuse, MD;  Location: [facility] ORS;  Service: General;  Laterality: N/A;   RECTAL EXAM UNDER ANESTHESIA N/A 02/05/2021   Procedure: ANORECTAL EXAM UNDER ANESTHESIA;  Surgeon: Andria Meuse, MD;  Location: [facility] ORS;  Service: General;  Laterality: N/A;   TESTICLE REMOVAL Left 2000    Allergies  Allergies  Allergen Reactions   Statins Other (See Comments)    Myalgias  Currently taking Crestor 10mg .     Labs/Other Studies Reviewed    The following studies were reviewed today:  Cardiac Studies & Procedures   CARDIAC CATHETERIZATION  CARDIAC CATHETERIZATION 10/20/2022  Narrative   CULPRIT Prox-Mid Cx lesion is 99% stenosed-> 1st Mrg lesion is 100% stenosed, TIMI 0 flow. -(1 lesion in segment)  Post intervention, there is a 30% residual stenosis throughout the entire segment restoring TIMI-3 flow.->   --------------------------------------------------------------------   Ost LAD to Prox LAD lesion is 85% stenosed.   A drug-eluting stent was successfully placed using a Synergy XD 3.0 x 16-deployed to 3.3 mm. Post intervention, there is a 0% residual stenosis.    Mid LAD to Dist LAD lesion is 60% stenosed. Dist LAD-1 lesion is 60% stenosed. Dist LAD-2 lesion is 30% stenosed.   ---------------------------------------------------------------------   Prox RCA lesion is 25% stenosed.  Dist RCA lesion is 20% stenosed.  2nd PL from rPDA is 60% stenosed.   ---------------------------------------------------------------------   The left ventricular systolic function is normal.  The left ventricular ejection fraction is 55-65% by visual estimate.  LV end diastolic pressure is normal.   There is no aortic valve stenosis.   ================================================ POST-CATH DIAGNOSES Three-Vessel Disease: Culprit Lesion: 99% proximal LCx into 100% OM1, TIMI 0 flow-> Successful Balloon PTCA restoring TIMI-3 flow reducing to 30% stenosis. Focal ostial-proximal LAD 85% stenosis (TIMI-3 flow) followed by sequential 60% lesions in the mid and still vessel as well as 30% a goal with diffuse disease, 1 diminutive D1 1 followed by 2 small caliber D2 and D3 branches mild disease. -> Successful DES PCI of ostial to proximal LAD with Synergy DES 3.0 mm x 16 mm deployed to 3.3 mm -> reduced to 0% (TIMI 3 flow pre & post) Large dominant RCA with proximal 25% and distal 20% lesions. There is a small posterolateral branch in the anterior descending artery actually bifurcates into get again another posterolateral branch that cover the large portion of the circumflex territory. This vessel has still percent ostial disease.  Relatively normal LVEF with no obvious wall motion abnormality. Normal EDP.   RECOMMENDATIONS The patient be transferred to [facility] for post-cath care with TR band removal Continue IV nitroglycerin as necessary for blood pressure control.  Use PRN's overnight and initiate irbesartan 37.5 mg along with carvedilol 12.5 mg twice daily Follow-up 2D echo Anticipate that he should be stable for discharge tomorrow Need to monitor for GI  bleed with Crohn's, if necessary may need to discontinue aspirin after 1 month and continue Brilinta monotherapy. Increased statin dose from 20 mg rosuvastatin 40 mg.  Seems to be tolerating.  If not would need PCSK9 inhibitor versus inclisiran. Diabetic control per primary team of consider SGLT2 inhibitor +/- GLP-1 agonist.    Bryan Lemma, MD  Findings Coronary Findings Diagnostic  Dominance: Right  Left Anterior Descending Ost LAD to Prox LAD lesion is 85% stenosed. Vessel is not the culprit lesion. The lesion is located proximal to the major branch, focal and concentric. Mid LAD to Dist LAD lesion is 60% stenosed. The lesion is focal and concentric. Dist LAD-1 lesion is 60% stenosed. Dist LAD-2 lesion is 30% stenosed.  First Diagonal Branch Vessel is small in size. There is severe disease in the vessel.  Second Diagonal Branch Vessel is small in size. There is mild disease in the vessel.  Third Diagonal Branch Vessel is small in size. There is mild disease in the vessel.  Third Septal Branch Vessel is small in size.  Left Circumflex Vessel is small. Mid Cx lesion is 99% stenosed. The lesion is located proximal to the major branch, irregular and ulcerative.  First Obtuse Marginal Branch Vessel is small in size. 1st Mrg lesion is 100% stenosed. Vessel is the culprit lesion. The lesion is type C, irregular and ulcerative.  Right Coronary  Artery Vessel was injected. Vessel is large. There is mild focal disease in the vessel. The vessel exhibits minimal luminal irregularities. Prox RCA lesion is 25% stenosed. Dist RCA lesion is 20% stenosed.  Acute Marginal Branch Vessel is small in size.  Right Ventricular Branch Vessel is small in size.  Inferior Septal Actually runs like a posterolateral branch. Inf Sept lesion is 60% stenosed.  First Right Posterolateral Branch Vessel is small in size.  Intervention  Ost LAD to Prox LAD lesion Stent Lesion length:  14  mm. CATH VISTA GUIDE 6FR XBLAD3.5 guide catheter was inserted. Lesion crossed with guidewire using a WIRE ASAHI PROWATER 180CM. Pre-stent angioplasty was performed using a BALLN SAPPHIRE 2.5X12. Maximum pressure:  12 atm. Inflation time:  20 sec. A drug-eluting stent was successfully placed using a SYNERGY XD 3.0X16. Maximum pressure: 18 atm. Inflation time: 30 sec. Stent strut is well apposed. Post-stent angioplasty was not performed. Appear to be fully expanded with TIMI-3 flow. Deployed at high atmospheres. Post-Intervention Lesion Assessment The intervention was successful. Pre-interventional TIMI flow is 3. Post-intervention TIMI flow is 3. Treated lesion length:  16 mm. No complications occurred at this lesion. There is a 0% residual stenosis post intervention.  Mid Cx lesion Angioplasty Lesion length:  12 mm. CATH VISTA GUIDE 6FR XBLAD3.5 guide catheter was inserted. WIRE RUNTHROUGH [place]ZA[place] 014 180 guidewire used to cross lesion. Balloon angioplasty was performed using a BALLN SAPPHIRE 1.25X10. Multiple inflations Maximum pressure: 8 atm. Inflation time: 30 sec. Post-Intervention Lesion Assessment The intervention was successful. Pre-interventional TIMI flow is 1. Post-intervention TIMI flow is 3. Treated lesion length:  14 mm. No complications occurred at this lesion. There is a 30% residual stenosis post intervention.  1st Mrg lesion Angioplasty Lesion length:  12 mm. CATH VISTA GUIDE 6FR XBLAD3.5 guide catheter was inserted. WIRE RUNTHROUGH [place] 014 180 guidewire used to cross lesion. Balloon angioplasty was performed using a BALLN SAPPHIRE 1.25X10. Multiple inflations Maximum pressure: 8 atm. Inflation time: 30 sec. Post-Intervention Lesion Assessment The intervention was successful. Pre-interventional TIMI flow is 0. Post-intervention TIMI flow is 3. Treated lesion length:  14 mm. No complications occurred at this lesion. There is a 30% residual stenosis post intervention.      ECHOCARDIOGRAM  ECHOCARDIOGRAM COMPLETE 10/21/2022  Narrative ECHOCARDIOGRAM REPORT    Patient Name:   JAQUILLE KAU Date of Exam: 10/21/2022 Medical Rec #:  [id number]      Height:       70.0 in Accession #:    [id number]     Weight:       214.0 lb Date of Birth:  1965/11/24       BSA:          2.148 m Patient Age:    56 years       BP:           122/60 mmHg Patient Gender: M              HR:           97 bpm. Exam Location:  Inpatient  Procedure: 2D Echo, Cardiac Doppler, Color Doppler and Intracardiac Opacification Agent  Indications:    I21.4 NSTEMI  History:        Patient has no prior history of Echocardiogram examinations. Previous Myocardial Infarction; Risk Factors:Non-Smoker, Hypertension, Diabetes and Dyslipidemia.  Sonographer:    Dondra Prader RVT RCS Referring Phys: 59 DAVID W [facility]   Sonographer Comments: Suboptimal apical window. IMPRESSIONS   1. Left ventricular ejection  fraction, by estimation, is 70 to 75%. The left ventricle has hyperdynamic function. The left ventricle has no regional wall motion abnormalities. There is mild left ventricular hypertrophy. Indeterminate diastolic filling due to E-A fusion. 2. Right ventricular systolic function is normal. The right ventricular size is normal. 3. The mitral valve is normal in structure. Trivial mitral valve regurgitation. 4. The aortic valve is tricuspid. Aortic valve regurgitation is not visualized. Aortic valve sclerosis/calcification is present, without any evidence of aortic stenosis. 5. Aortic dilatation noted. There is moderate dilatation of the ascending aorta, measuring 43 mm. 6. The inferior vena cava is dilated in size with <50% respiratory variability, suggesting right atrial pressure of 15 mmHg.  FINDINGS Left Ventricle: Left ventricular ejection fraction, by estimation, is 70 to 75%. The left ventricle has hyperdynamic function. The left ventricle has no regional wall motion abnormalities.  Definity contrast agent was given IV to delineate the left ventricular endocardial borders. The left ventricular internal cavity size was normal in size. There is mild left ventricular hypertrophy. Indeterminate diastolic filling due to E-A fusion.  Right Ventricle: The right ventricular size is normal. Right vetricular wall thickness was not assessed. Right ventricular systolic function is normal.  Left Atrium: Left atrial size was normal in size.  Right Atrium: Right atrial size was normal in size.  Pericardium: There is no evidence of pericardial effusion.  Mitral Valve: The mitral valve is normal in structure. Trivial mitral valve regurgitation.  Tricuspid Valve: The tricuspid valve is normal in structure. Tricuspid valve regurgitation is trivial.  Aortic Valve: The aortic valve is tricuspid. Aortic valve regurgitation is not visualized. Aortic valve sclerosis/calcification is present, without any evidence of aortic stenosis. Aortic valve mean gradient measures 3.0 mmHg. Aortic valve peak gradient measures 6.0 mmHg. Aortic valve area, by VTI measures 3.48 cm.  Pulmonic Valve: The pulmonic valve was normal in structure. Pulmonic valve regurgitation is not visualized.  Aorta: Aortic dilatation noted and the aortic root is normal in size and structure. There is moderate dilatation of the ascending aorta, measuring 43 mm.  Venous: The inferior vena cava is dilated in size with less than 50% respiratory variability, suggesting right atrial pressure of 15 mmHg.  IAS/Shunts: No atrial level shunt detected by color flow Doppler.   LEFT VENTRICLE PLAX 2D LVIDd:         3.80 cm   Diastology LVIDs:         2.10 cm   LV e' medial:    7.83 cm/s LV PW:         1.40 cm   LV E/e' medial:  10.3 LV IVS:        1.10 cm   LV e' lateral:   7.18 cm/s LVOT diam:     2.30 cm   LV E/e' lateral: 11.2 LV SV:         73 LV SV Index:   34 LVOT Area:     4.15 cm   RIGHT VENTRICLE             IVC RV  Basal diam:  3.20 cm     IVC diam: 2.20 cm RV S prime:     16.50 cm/s  LEFT ATRIUM             Index        RIGHT ATRIUM           Index LA diam:        3.70 cm 1.72 cm/m   RA Area:  14.90 cm LA Vol (A2C):   53.3 ml 24.81 ml/m  RA Volume:   41.50 ml  19.32 ml/m LA Vol (A4C):   51.2 ml 23.81 ml/m LA Biplane Vol: 60.3 ml 28.07 ml/m AORTIC VALVE                    PULMONIC VALVE AV Area (Vmax):    3.68 cm     PV Vmax:       1.02 m/s AV Area (Vmean):   3.54 cm     PV Peak grad:  4.2 mmHg AV Area (VTI):     3.48 cm AV Vmax:           122.00 cm/s AV Vmean:          82.800 cm/s AV VTI:            0.209 m AV Peak Grad:      6.0 mmHg AV Mean Grad:      3.0 mmHg LVOT Vmax:         108.00 cm/s LVOT Vmean:        70.600 cm/s LVOT VTI:          0.175 m LVOT/AV VTI ratio: 0.84  AORTA Ao Root diam: 3.20 cm Ao Asc diam:  4.30 cm Ao Arch diam: 3.0 cm  MITRAL VALVE MV Area (PHT): 4.19 cm    SHUNTS MV Decel Time: 181 msec    Systemic VTI:  0.18 m MV E velocity: 80.70 cm/s  Systemic Diam: 2.30 cm MV A velocity: 99.00 cm/s MV E/A ratio:  0.82  Dietrich Pates MD Electronically signed by Dietrich Pates MD Signature Date/Time: 10/21/2022/3:00:47 PM    Final            Recent Labs: 12/21/2022: Magnesium 1.8 12/23/2022: Hemoglobin 9.6; Platelets 172 12/24/2022: ALT 12; BUN 13; Creatinine, Ser 1.50; Potassium 3.5; Sodium 136  Recent Lipid Panel    Component Value Date/Time   CHOL 132 10/21/2022 0023   CHOL 206 (H) 02/04/2018 1720   TRIG 99 10/21/2022 0023   HDL 50 10/21/2022 0023   HDL 38 (L) 02/04/2018 1720   CHOLHDL 2.6 10/21/2022 0023   VLDL 20 10/21/2022 0023   LDLCALC 62 10/21/2022 0023   LDLCALC 105 (H) 02/04/2018 1720   LDLDIRECT 76.0 06/01/2019 1039    History of Present Illness    57 year old male with the above past medical history including CAD s/p NSTEMI, DES-pLAD, PTCA-pLCx, dilation of the ascending aorta, hypertension, hyperlipidemia, Crohn's disease, and type 2  diabetes.   He was hospitalized in 09/2022 in the setting of NSTEMI.  Cardiac catheterization revealed three-vessel disease.  He underwent PTCA-LCx/OM1, as well as DES-pLAD.  Echocardiogram showed hyperdynamic function, EF 70 to 75%, mild LVH, no RWMA, moderate dilation of the ascending aorta measuring 43 mm.  He was started on DAPT with aspirin and Brilinta. He was last seen in the office on 11/04/2022 and was doing well from a cardiac standpoint.  He denied symptoms concerning for angina.  He was hospitalized in May 2024 in the setting of left-sided perianal abscess, complex Crohn's disease.  Jardiance and ARB were held in the setting of AKI, hypotension.    He presents today for follow-up accompanied by his wife.  Since his last visit he has done well from a cardiac standpoint.    He is active, exercising regularly. He denies any symptoms concerning for angina. BP has been stable. Unfortunately, he has been dealing with flares of his Crohn's  disease.  He recently started [employer].  He is following closely with GI.  Otherwise, he reports feeling well.  Home Medications    Current Outpatient Medications  Medication Sig Dispense Refill   aspirin EC 81 MG tablet Take 1 tablet (81 mg total) by mouth daily. Swallow whole. 30 tablet 1   carvedilol (COREG) 12.5 MG tablet Take 6.25 mg ( 1/2 tablet of 12.5 mg)  in the morning and 12.5 mg  in the evening 180 tablet 1   Continuous Glucose Sensor (FREESTYLE LIBRE 14 DAY SENSOR) MISC Apply 1 sensor to the back of upper arm every 14 days 2 each 3   insulin degludec (TRESIBA FLEXTOUCH) 100 UNIT/ML FlexTouch Pen Inject 38 Units into the skin daily.     insulin lispro (HUMALOG) 100 UNIT/ML injection Inject 8 Units into the skin 3 (three) times daily with meals.     ondansetron (ZOFRAN-ODT) 4 MG disintegrating tablet Dissolve 1 tablet (4 mg total) by mouth every 8 (eight) hours as needed for nausea or vomiting. 20 tablet 0   pantoprazole (PROTONIX) 40 MG tablet Take 1  tablet (40 mg total) by mouth 2 (two) times daily. 60 tablet 11   [START ON 04/14/2023] Risankizumab-rzaa (SKYRIZI) 180 MG/1.2ML SOCT Inject 1.2 mLs into the skin every 8 (eight) weeks. 1.2 mL 5   rosuvastatin (CRESTOR) 40 MG tablet Take 1 tablet (40 mg total) by mouth daily. 90 tablet 1   ticagrelor (BRILINTA) 90 MG TABS tablet Take 1 tablet (90 mg) by mouth 2 times daily. 180 tablet 1   nitroGLYCERIN (NITROSTAT) 0.4 MG SL tablet Place 1 tablet (0.4 mg total) under the tongue every 5 (five) minutes as needed for chest pain. 45 tablet 3   Current Facility-Administered Medications  Medication Dose Route Frequency Provider Last Rate Last Admin   Insulin Pen Needle (NOVOFINE) 10 each  1 packet Subcutaneous QHS Ofilia Neas, PA-C         Review of Systems    He denies chest pain, palpitations, dyspnea, pnd, orthopnea, n, v, dizziness, syncope, edema, weight gain, or early satiety. All other systems reviewed and are otherwise negative except as noted above.   Physical Exam    VS:  BP 124/74   Pulse 96   Ht 5\' 10"  (1.778 m)   Wt 209 lb 6.4 oz (95 kg)   SpO2 98%   BMI 30.05 kg/m   GEN: Well nourished, well developed, in no acute distress. HEENT: normal. Neck: Supple, no JVD, carotid bruits, or masses. Cardiac: RRR, no murmurs, rubs, or gallops. No clubbing, cyanosis, edema.  Radials/DP/PT 2+ and equal bilaterally.  Respiratory:  Respirations regular and unlabored, clear to auscultation bilaterally. GI: Soft, nontender, nondistended, BS + x 4. MS: no deformity or atrophy. Skin: warm and dry, no rash. Neuro:  Strength and sensation are intact. Psych: Normal affect.  Accessory Clinical Findings    ECG personally reviewed by me today -    - no EKG in office today.    Lab Results  Component Value Date   WBC 8.0 12/23/2022   HGB 9.6 (L) 12/23/2022   HCT 27.6 (L) 12/23/2022   MCV 98.2 12/23/2022   PLT 172 12/23/2022   Lab Results  Component Value Date   CREATININE 1.50 (H)  12/24/2022   BUN 13 12/24/2022   NA 136 12/24/2022   K 3.5 12/24/2022   CL 106 12/24/2022   CO2 23 12/24/2022   Lab Results  Component Value Date   ALT 12  12/24/2022   AST 14 (L) 12/24/2022   ALKPHOS 33 (L) 12/24/2022   BILITOT 0.8 12/24/2022   Lab Results  Component Value Date   CHOL 132 10/21/2022   HDL 50 10/21/2022   LDLCALC 62 10/21/2022   LDLDIRECT 76.0 06/01/2019   TRIG 99 10/21/2022   CHOLHDL 2.6 10/21/2022    Lab Results  Component Value Date   HGBA1C 12.1 (H) 10/21/2022    Assessment & Plan   1. CAD: S/p NSTEMI, DES-pLAD, PTCA-pLCx/OM1. Stable with no anginal symptoms.  Irbesartan was held in the setting of hypotension, AKI.  BP has been, borderline low at times.  Will defer reintroduction of ARB at this time. Continue aspirin, Brilinta, carvedilol, and Crestor.    2. Aortic dilation: Measured 43 mm on most recent echo in 09/2022.  Will plan to monitor annually with either CT angio chest/aorta versus echocardiogram. BP well controlled.    3. Hypertension: BP well controlled. Continue current antihypertensive regimen.    4. Hyperlipidemia: LDL was 62 in 10/2022. Crestor was increased to 40 mg daily during recent hospitalization. Will repeat fasting lipid panel, LFTs. Continue Crestor.    5. Type 2 diabetes: A1c was 7.5 in 12/2022.  Monitored and managed per PCP.   6. Crohn's disease: Recent flares requiring hospitalization.  Recently started on [place]. Follows with GI.   7. Disposition: Follow-up in 6 months.       Joylene Grapes, NP 02/03/2023, 8:14 AM

## 2023-02-04 LAB — HEPATIC FUNCTION PANEL
ALT: 11 IU/L (ref 0–44)
AST: 15 IU/L (ref 0–40)
Albumin: 4 g/dL (ref 3.8–4.9)
Alkaline Phosphatase: 72 IU/L (ref 44–121)
Bilirubin Total: 0.6 mg/dL (ref 0.0–1.2)
Bilirubin, Direct: 0.19 mg/dL (ref 0.00–0.40)
Total Protein: 7.5 g/dL (ref 6.0–8.5)

## 2023-02-04 LAB — LIPID PANEL
Chol/HDL Ratio: 2.8 ratio (ref 0.0–5.0)
Cholesterol, Total: 130 mg/dL (ref 100–199)
HDL: 46 mg/dL (ref >39)
LDL Chol Calc (NIH): 61 mg/dL (ref 0–99)
Triglycerides: 130 mg/dL (ref 0–149)
VLDL Cholesterol Cal: 23 mg/dL (ref 5–40)

## 2023-02-05 ENCOUNTER — Other Ambulatory Visit: Payer: Self-pay

## 2023-02-06 ENCOUNTER — Other Ambulatory Visit: Payer: Self-pay

## 2023-02-06 ENCOUNTER — Telehealth: Payer: Self-pay

## 2023-02-06 NOTE — Telephone Encounter (Signed)
Left a detailed message for pt with lab results. Pt advised to call back with any questions or concerns.

## 2023-02-06 NOTE — Telephone Encounter (Signed)
Patient returned staff call. 

## 2023-02-06 NOTE — Telephone Encounter (Signed)
Lmom to discuss lab results. Waiting on a return call.  

## 2023-02-07 ENCOUNTER — Other Ambulatory Visit: Payer: Self-pay

## 2023-02-11 ENCOUNTER — Other Ambulatory Visit: Payer: Self-pay

## 2023-02-11 ENCOUNTER — Ambulatory Visit (INDEPENDENT_AMBULATORY_CARE_PROVIDER_SITE_OTHER): Payer: 59 | Admitting: Gastroenterology

## 2023-02-11 ENCOUNTER — Encounter: Payer: Self-pay | Admitting: Gastroenterology

## 2023-02-11 VITALS — BP 120/82 | HR 107 | Ht 70.0 in | Wt 208.0 lb

## 2023-02-11 DIAGNOSIS — K2100 Gastro-esophageal reflux disease with esophagitis, without bleeding: Secondary | ICD-10-CM

## 2023-02-11 DIAGNOSIS — K50113 Crohn's disease of large intestine with fistula: Secondary | ICD-10-CM

## 2023-02-11 DIAGNOSIS — K611 Rectal abscess: Secondary | ICD-10-CM | POA: Diagnosis not present

## 2023-02-11 DIAGNOSIS — Z7960 Long term (current) use of unspecified immunomodulators and immunosuppressants: Secondary | ICD-10-CM | POA: Diagnosis not present

## 2023-02-11 NOTE — Progress Notes (Signed)
[facility] Progress Note  Chief Complaint: Perianal Crohn's disease with recurrent fistula  Subjective  History: Sean Schaefer follows up for his Crohn's.  Clinical details in multiple prior notes, incl 12/20/22 at this office.  Was hospitalized that day and required another I&D and Abx for buttock abscess.  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  Dr. Cliffton Asters has previously offered permanent ileostomy, which Sean Schaefer has declined.  On 5 mg/kg infliximab (see prior notes - 10 mg/kg caused altered vision s.e.) and AZA.  Level nml, neg Ab, indicating mechanistic med failure.  Changed to [facility] - first dose 600 mg on 01/20/23.  Next scheduled for 02/17/23. Referred to Dr. Idelle Leech at [facility] ( I also communicated with that provider - they are booked out months for new patients). ________________ Still on protonix twice daily since late May inpatient EGD for reflux esophagitis.  Currently does not have heartburn or dysphagia.  He had previously been on Pepcid for some intermittent reflux symptoms that sound primarily to have been belching in the late evening or feelings of regurgitation.  He has had no fevers, his perianal/buttock pain is significantly diminished since surgery.  2 seton still in place (one had previously fallen out), no purulent discharge but he does still have some fluid leakage from one of the openings.  Tolerating Skyrizi so far and looking forward to his appointment at [facility] next month.   ROS: Cardiovascular:  no chest pain Respiratory: no dyspnea Still hurts somewhat to sit for prolonged periods due to the setons The patient's Past Medical, Family and Social History were reviewed and are on file in the EMR.  Objective:  Med list reviewed  Current Outpatient Medications:    aspirin EC 81 MG tablet, Take 1 tablet (81 mg total) by mouth daily. Swallow whole., Disp: 30 tablet, Rfl: 1   carvedilol (COREG) 12.5 MG tablet, Take 6.25 mg ( 1/2 tablet of 12.5 mg)   in the morning and 12.5 mg  in the evening, Disp: 180 tablet, Rfl: 1   Continuous Glucose Sensor (FREESTYLE LIBRE 14 DAY SENSOR) MISC, Apply 1 sensor to the back of upper arm every 14 days, Disp: 2 each, Rfl: 3   insulin degludec (TRESIBA FLEXTOUCH) 100 UNIT/ML FlexTouch Pen, Inject 38 Units into the skin daily., Disp: , Rfl:    insulin lispro (HUMALOG) 100 UNIT/ML injection, Inject 8 Units into the skin 3 (three) times daily with meals., Disp: , Rfl:    nitroGLYCERIN (NITROSTAT) 0.4 MG SL tablet, Place 1 tablet (0.4 mg total) under the tongue every 5 (five) minutes as needed for chest pain., Disp: 45 tablet, Rfl: 3   ondansetron (ZOFRAN-ODT) 4 MG disintegrating tablet, Dissolve 1 tablet (4 mg total) by mouth every 8 (eight) hours as needed for nausea or vomiting., Disp: 20 tablet, Rfl: 0   pantoprazole (PROTONIX) 40 MG tablet, Take 1 tablet (40 mg total) by mouth 2 (two) times daily., Disp: 60 tablet, Rfl: 11   [START ON 04/14/2023] Risankizumab-rzaa (SKYRIZI) 180 MG/1.2ML SOCT, Inject 1.2 mLs into the skin every 8 (eight) weeks., Disp: 1.2 mL, Rfl: 5   rosuvastatin (CRESTOR) 40 MG tablet, Take 1 tablet (40 mg total) by mouth daily., Disp: 90 tablet, Rfl: 1   ticagrelor (BRILINTA) 90 MG TABS tablet, Take 1 tablet (90 mg) by mouth 2 times daily., Disp: 180 tablet, Rfl: 1  Current Facility-Administered Medications:    Insulin Pen Needle (NOVOFINE) 10 each, 1 packet, Subcutaneous, QHS, Ofilia Neas,  PA-C   Vital signs in last 24 hrs: Vitals:   02/11/23 1603  BP: 120/82  Pulse: (!) 107   Wt Readings from Last 3 Encounters:  02/11/23 208 lb (94.3 kg)  02/03/23 209 lb 6.4 oz (95 kg)  01/20/23 204 lb (92.5 kg)    Physical Exam  Well-appearing HEENT: sclera anicteric, oral mucosa moist without lesions Neck: supple, no thyromegaly, JVD or lymphadenopathy Cardiac: Regular without appreciable murmur,  no peripheral edema Pulm: clear to auscultation bilaterally, normal RR and effort  noted Abdomen: soft, no tenderness, with active bowel sounds. No guarding or palpable hepatosplenomegaly. Skin; warm and dry, no jaundice or rash 2 setons in place, no fluid or pus expressed from either opening (1 in the perineum, 1 in the left buttock.  Skin looks healthy with no apparent infection.  There is scarring in this area with a "water can" appearance on the left side Labs:   ___________________________________________ Radiologic studies:  CLINICAL DATA:  Anorectal abscess.   EXAM: CT ABDOMEN AND PELVIS WITH CONTRAST   TECHNIQUE: Multidetector CT imaging of the abdomen and pelvis was performed using the standard protocol following bolus administration of intravenous contrast.   RADIATION DOSE REDUCTION: This exam was performed according to the departmental dose-optimization program which includes automated exposure control, adjustment of the mA and/or kV according to patient size and/or use of iterative reconstruction technique.   CONTRAST:  70mL OMNIPAQUE IOHEXOL 350 MG/ML SOLN   COMPARISON:  CT 12/09/2022   FINDINGS: Lower chest: Bilateral gynecomastia. No acute basilar airspace disease or pleural effusion.   Hepatobiliary: Layering gallstones. No gallbladder inflammation. Unremarkable appearance of the liver without focal liver lesion. No biliary dilatation.   Pancreas: No ductal dilatation or inflammation.   Spleen: Normal in size without focal abnormality.   Adrenals/Urinary Tract: Normal adrenal glands. No hydronephrosis or perinephric edema. Homogeneous renal enhancement with symmetric excretion on delayed phase imaging. No renal stone or focal renal abnormality. Urinary bladder is distended extending superior to the umbilicus, volume = 900 cm^3. No definite bladder wall thickening.   Stomach/Bowel: Subtotal colonic resection with ileo anal anastomosis. Stool within the ileal anal pouch with mild bowel wall thickening again seen. Fluid collection  extending from the left aspect of the rectum extends into the gluteal soft tissues, multiloculated and irregular in shape. The largest component measures at least 6.6 x 4.4 x 7 cm. Suspected seton in place. There is adjacent subcutaneous stranding in the soft tissues. Component extends along the Sisters in the left perineum extending to the base of the penis with small amount of gas.   No inflammation or obstruction of small bowel. Minimal distal esophageal wall thickening. Unremarkable appearance of the stomach.   Vascular/Lymphatic: Mild aorto bi-iliac atherosclerosis. Reactive appearing 12 mm left inguinal nodes.   Reproductive: Large prostate causes mass effect on the bladder base. Prostate spans 0.3 cm transverse.   Other: No intra-abdominopelvic fluid collection. Free intra-abdominal   Musculoskeletal: Degenerative change in the spine, facet predominant. Bilateral hip degenerative change. There are no acute or suspicious osseous abnormalities. No intramuscular collection.   IMPRESSION: 1. Fluid collection extending from the left aspect of the rectum into the gluteal soft tissues, largest component measures at least 6.6 x 4.4 x 7 cm. Suspected seton in place. Component of the fluid collection extends along the seton in the left perineum extending to the base of the penis with small amount of gas. 2. Status post colectomy with ileal anal anastomosis. Mild wall thickening of the anorectal  junction. 3. Distended urinary bladder extending superior to the umbilicus, recommend correlation for urinary retention. 4. Enlarged prostate causes mass effect on the bladder base. 5. Cholelithiasis without gallbladder inflammation.   Aortic Atherosclerosis (ICD10-I70.0).     Electronically Signed   By: Narda Rutherford M.D.   On: 12/20/2022 20:19 ____________________________________________ Other:  Most recent operative report reviewed.  Case also discussed with Dr. Angelena Form  afterward. _____________________________________________ Assessment & Plan  Assessment: Encounter Diagnoses  Name Primary?   Crohn's disease of perianal region with fistula (HCC) Yes   Long-term use of immunosuppressant medication    Peri-rectal abscess    Gastroesophageal reflux disease with esophagitis without hemorrhage     Complex case of fistulizing perianal Crohn's disease with activity in the pouch by CT scan.  He has failed anti-TNF therapy and recently changed to [employer].  So far he is tolerating that well, though I think it is too soon to tell what its full effect will be.  He has had discussions with Dr. Cliffton Asters about consideration of a permanent ileostomy, but Jahmani he is understandably reluctant to do that since he would still need to be on long-term Crohn's treatment.  He also does not know whether that will allow his fistula any better chance of healing even if his Crohn's gets under better control on medicines.  I have encouraged him to raise that question with Dr. Gwenith Spitz at next [facility] consultation.  In addition, I have also asked him to bring up the possibility of consultation with one of their IBD surgeons.  I am currently planning to continue Skyrizi at standard dosing, and his second infusion is scheduled for next week.  He will then have 1/3 infusion followed by self-administered subcu injections starting 4 weeks after that and every 8 weeks thereafter.  He is reassured that he does not need concomitant immune modulator such as azathioprine with the [employer].  Regarding the reflux esophagitis found on endoscopy, not clear of its chronicity.  I recommended he decrease the pantoprazole to 40 mg once daily for a week, then decrease to once every other day and continue that for the time being, to be revisited at next follow-up with potential de-escalation back to H2 blocker.  Next office visit with me not yet set up, timing to be determined after receiving [facility] GI  consultation.  35 minutes were spent on this encounter (including chart review, history/exam, counseling/coordination of care, and documentation) > 50% of that time was spent on counseling and coordination of care.   Sean Schaefer

## 2023-02-11 NOTE — Patient Instructions (Signed)
_______________________________________________________  If your blood pressure at your visit was 140/90 or greater, please contact your primary care physician to follow up on this.  _______________________________________________________  If you are age 55 or older, your body mass index should be between 23-30. Your Body mass index is 29.84 kg/m. If this is out of the aforementioned range listed, please consider follow up with your Primary Care Provider.  If you are age 27 or younger, your body mass index should be between 19-25. Your Body mass index is 29.84 kg/m. If this is out of the aformentioned range listed, please consider follow up with your Primary Care Provider.   ________________________________________________________  The [facility] providers would like to encourage you to use [facility] to communicate with providers for non-urgent requests or questions.  Due to long hold times on the telephone, sending your provider a message by [facility] may be a faster and more efficient way to get a response.  Please allow 48 business hours for a response.  Please remember that this is for non-urgent requests.  _______________________________________________________

## 2023-02-14 ENCOUNTER — Other Ambulatory Visit: Payer: Self-pay

## 2023-02-14 ENCOUNTER — Other Ambulatory Visit (HOSPITAL_COMMUNITY): Payer: Self-pay

## 2023-02-14 ENCOUNTER — Ambulatory Visit: Payer: 59 | Attending: Internal Medicine | Admitting: Pharmacist

## 2023-02-14 ENCOUNTER — Encounter: Payer: Self-pay | Admitting: Gastroenterology

## 2023-02-14 DIAGNOSIS — Z79899 Other long term (current) drug therapy: Secondary | ICD-10-CM

## 2023-02-14 MED ORDER — SKYRIZI 180 MG/1.2ML ~~LOC~~ SOCT
1.2000 mL | SUBCUTANEOUS | 5 refills | Status: DC
  Filled 2023-02-14 – 2023-04-01 (×2): qty 1.2, 56d supply, fill #0
  Filled 2023-05-28: qty 1.2, 56d supply, fill #1
  Filled 2023-07-21: qty 1.2, 56d supply, fill #2

## 2023-02-14 NOTE — Progress Notes (Signed)
   S: Patient presents for review of their specialty medication therapy.  Patient is currently taking Skyrizi for [employer]. Patient is managed by Dr. Myrtie Neither for this.   Adherence: confirms. Started with first injection 4 weeks ago.  Efficacy: no difference in symptomology yet but just started.   Dosing: Plaque psoriasis, moderate to severe: SubQ: Two consecutive injections (75 mg each) for a total dose of 150 mg at weeks 0, 4, and then every 12 weeks thereafter.  Monitoring: S/sx of infection: none S/sx of hypersensitivity/injection site reaction: none   O:     Lab Results  Component Value Date   WBC 8.0 12/23/2022   HGB 9.6 (L) 12/23/2022   HCT 27.6 (L) 12/23/2022   MCV 98.2 12/23/2022   PLT 172 12/23/2022      Chemistry      Component Value Date/Time   NA 136 12/24/2022 0104   NA 135 11/04/2022 0915   K 3.5 12/24/2022 0104   CL 106 12/24/2022 0104   CO2 23 12/24/2022 0104   BUN 13 12/24/2022 0104   BUN 21 11/04/2022 0915   CREATININE 1.50 (H) 12/24/2022 0104   CREATININE 1.25 01/08/2016 1611      Component Value Date/Time   CALCIUM 8.1 (L) 12/24/2022 0104   ALKPHOS 72 02/03/2023 0901   AST 15 02/03/2023 0901   ALT 11 02/03/2023 0901   BILITOT 0.6 02/03/2023 0901       A/P: 1. Medication review: Patient currently on Skyrizi for Crohn's. Reviewed the medication with the patient, including the following: Cristy Folks is a monoclonal antibody used in the treatment of Crohn's. Patient educated on purpose, proper use and potential adverse effects of Skyrizi. Possible adverse effects are infections, headache, and injection site reactions. Live vaccinations should be avoided while on therapy. SubQ: Administer the two consecutive injections subcutaneously at different anatomic locations, such as thighs, abdomen, or back of upper arms. Do not inject into areas where the skin is tender, bruised, red, hard, or affected by psoriasis. Intended for use under supervision of a health  care professional; self-injection may occur after proper training (except back of upper arms). No recommendations for any changes at this time.  Butch Penny, PharmD, Patsy [place], CPP Clinical Pharmacist [facility] [phone]

## 2023-02-17 ENCOUNTER — Ambulatory Visit (INDEPENDENT_AMBULATORY_CARE_PROVIDER_SITE_OTHER): Payer: 59

## 2023-02-17 VITALS — BP 170/103 | HR 86 | Temp 98.1°F | Resp 16 | Wt 207.8 lb

## 2023-02-17 DIAGNOSIS — E1169 Type 2 diabetes mellitus with other specified complication: Secondary | ICD-10-CM | POA: Diagnosis not present

## 2023-02-17 DIAGNOSIS — E7800 Pure hypercholesterolemia, unspecified: Secondary | ICD-10-CM | POA: Diagnosis not present

## 2023-02-17 DIAGNOSIS — Z0000 Encounter for general adult medical examination without abnormal findings: Secondary | ICD-10-CM | POA: Diagnosis not present

## 2023-02-17 DIAGNOSIS — I10 Essential (primary) hypertension: Secondary | ICD-10-CM | POA: Diagnosis not present

## 2023-02-17 DIAGNOSIS — Z125 Encounter for screening for malignant neoplasm of prostate: Secondary | ICD-10-CM | POA: Diagnosis not present

## 2023-02-17 DIAGNOSIS — Z794 Long term (current) use of insulin: Secondary | ICD-10-CM | POA: Diagnosis not present

## 2023-02-17 DIAGNOSIS — K50114 Crohn's disease of large intestine with abscess: Secondary | ICD-10-CM

## 2023-02-17 DIAGNOSIS — K5090 Crohn's disease, unspecified, without complications: Secondary | ICD-10-CM | POA: Diagnosis not present

## 2023-02-17 MED ORDER — RISANKIZUMAB-RZAA 600 MG/10ML IV SOLN
600.0000 mg | Freq: Once | INTRAVENOUS | Status: AC
  Administered 2023-02-17: 600 mg via INTRAVENOUS
  Filled 2023-02-17: qty 10

## 2023-02-17 NOTE — Progress Notes (Signed)
Diagnosis: Crohn's Disease  Provider:  Chilton Greathouse MD  Procedure: IV Infusion  IV Type: Peripheral, IV Location: R Antecubital  Skyrizi (risankizumab-rzaa), Dose: 600 mg  Infusion Start Time: 0912  Infusion Stop Time: 1015  Post Infusion IV Care: Peripheral IV Discontinued  Discharge: Condition: Good, Destination: Home . AVS Provided  Performed by:  Loney Hering, LPN

## 2023-02-18 ENCOUNTER — Other Ambulatory Visit: Payer: Self-pay

## 2023-02-19 ENCOUNTER — Other Ambulatory Visit: Payer: Self-pay

## 2023-02-20 ENCOUNTER — Other Ambulatory Visit: Payer: Self-pay

## 2023-02-21 ENCOUNTER — Other Ambulatory Visit: Payer: Self-pay | Admitting: Nurse Practitioner

## 2023-02-24 ENCOUNTER — Other Ambulatory Visit (HOSPITAL_COMMUNITY): Payer: Self-pay

## 2023-02-24 MED ORDER — ROSUVASTATIN CALCIUM 40 MG PO TABS
40.0000 mg | ORAL_TABLET | Freq: Every day | ORAL | 3 refills | Status: DC
  Filled 2023-02-24 – 2023-05-27 (×2): qty 90, 90d supply, fill #0
  Filled 2023-09-01: qty 90, 90d supply, fill #1
  Filled 2023-12-08: qty 90, 90d supply, fill #2

## 2023-02-27 ENCOUNTER — Other Ambulatory Visit: Payer: Self-pay

## 2023-03-12 ENCOUNTER — Inpatient Hospital Stay (HOSPITAL_COMMUNITY)
Admission: EM | Admit: 2023-03-12 | Discharge: 2023-03-14 | DRG: 571 | Disposition: A | Discharge status: Home / Self Care | Payer: 59 | Attending: Internal Medicine | Admitting: Internal Medicine

## 2023-03-12 ENCOUNTER — Encounter (HOSPITAL_COMMUNITY): Payer: Self-pay

## 2023-03-12 ENCOUNTER — Other Ambulatory Visit: Payer: Self-pay

## 2023-03-12 ENCOUNTER — Emergency Department (HOSPITAL_COMMUNITY): Payer: 59

## 2023-03-12 DIAGNOSIS — L02416 Cutaneous abscess of left lower limb: Secondary | ICD-10-CM | POA: Diagnosis not present

## 2023-03-12 DIAGNOSIS — R509 Fever, unspecified: Secondary | ICD-10-CM | POA: Diagnosis not present

## 2023-03-12 DIAGNOSIS — N400 Enlarged prostate without lower urinary tract symptoms: Secondary | ICD-10-CM | POA: Diagnosis not present

## 2023-03-12 DIAGNOSIS — Z809 Family history of malignant neoplasm, unspecified: Secondary | ICD-10-CM

## 2023-03-12 DIAGNOSIS — Z79899 Other long term (current) drug therapy: Secondary | ICD-10-CM | POA: Diagnosis not present

## 2023-03-12 DIAGNOSIS — Z7902 Long term (current) use of antithrombotics/antiplatelets: Secondary | ICD-10-CM | POA: Diagnosis not present

## 2023-03-12 DIAGNOSIS — L0291 Cutaneous abscess, unspecified: Secondary | ICD-10-CM | POA: Diagnosis present

## 2023-03-12 DIAGNOSIS — Z794 Long term (current) use of insulin: Secondary | ICD-10-CM

## 2023-03-12 DIAGNOSIS — I25110 Atherosclerotic heart disease of native coronary artery with unstable angina pectoris: Secondary | ICD-10-CM | POA: Diagnosis not present

## 2023-03-12 DIAGNOSIS — A419 Sepsis, unspecified organism: Secondary | ICD-10-CM

## 2023-03-12 DIAGNOSIS — Z7982 Long term (current) use of aspirin: Secondary | ICD-10-CM | POA: Diagnosis not present

## 2023-03-12 DIAGNOSIS — Z955 Presence of coronary angioplasty implant and graft: Secondary | ICD-10-CM

## 2023-03-12 DIAGNOSIS — Z9079 Acquired absence of other genital organ(s): Secondary | ICD-10-CM

## 2023-03-12 DIAGNOSIS — R109 Unspecified abdominal pain: Secondary | ICD-10-CM | POA: Diagnosis not present

## 2023-03-12 DIAGNOSIS — L02211 Cutaneous abscess of abdominal wall: Secondary | ICD-10-CM | POA: Diagnosis not present

## 2023-03-12 DIAGNOSIS — K5090 Crohn's disease, unspecified, without complications: Secondary | ICD-10-CM | POA: Diagnosis present

## 2023-03-12 DIAGNOSIS — E119 Type 2 diabetes mellitus without complications: Secondary | ICD-10-CM | POA: Diagnosis not present

## 2023-03-12 DIAGNOSIS — R112 Nausea with vomiting, unspecified: Secondary | ICD-10-CM

## 2023-03-12 DIAGNOSIS — I252 Old myocardial infarction: Secondary | ICD-10-CM | POA: Diagnosis not present

## 2023-03-12 DIAGNOSIS — Z833 Family history of diabetes mellitus: Secondary | ICD-10-CM

## 2023-03-12 DIAGNOSIS — Z8249 Family history of ischemic heart disease and other diseases of the circulatory system: Secondary | ICD-10-CM | POA: Diagnosis not present

## 2023-03-12 DIAGNOSIS — Z9049 Acquired absence of other specified parts of digestive tract: Secondary | ICD-10-CM

## 2023-03-12 DIAGNOSIS — I10 Essential (primary) hypertension: Secondary | ICD-10-CM | POA: Diagnosis present

## 2023-03-12 DIAGNOSIS — K50918 Crohn's disease, unspecified, with other complication: Secondary | ICD-10-CM | POA: Diagnosis not present

## 2023-03-12 DIAGNOSIS — K50114 Crohn's disease of large intestine with abscess: Secondary | ICD-10-CM | POA: Diagnosis not present

## 2023-03-12 DIAGNOSIS — Z888 Allergy status to other drugs, medicaments and biological substances status: Secondary | ICD-10-CM | POA: Diagnosis not present

## 2023-03-12 DIAGNOSIS — K8020 Calculus of gallbladder without cholecystitis without obstruction: Secondary | ICD-10-CM | POA: Diagnosis not present

## 2023-03-12 DIAGNOSIS — K50119 Crohn's disease of large intestine with unspecified complications: Secondary | ICD-10-CM | POA: Diagnosis not present

## 2023-03-12 DIAGNOSIS — I2510 Atherosclerotic heart disease of native coronary artery without angina pectoris: Secondary | ICD-10-CM | POA: Diagnosis not present

## 2023-03-12 DIAGNOSIS — K5010 Crohn's disease of large intestine without complications: Secondary | ICD-10-CM | POA: Diagnosis present

## 2023-03-12 DIAGNOSIS — Z9889 Other specified postprocedural states: Secondary | ICD-10-CM

## 2023-03-12 DIAGNOSIS — E785 Hyperlipidemia, unspecified: Secondary | ICD-10-CM | POA: Diagnosis present

## 2023-03-12 DIAGNOSIS — Z800 Family history of malignant neoplasm of digestive organs: Secondary | ICD-10-CM | POA: Diagnosis not present

## 2023-03-12 DIAGNOSIS — L03311 Cellulitis of abdominal wall: Secondary | ICD-10-CM | POA: Diagnosis present

## 2023-03-12 DIAGNOSIS — E1169 Type 2 diabetes mellitus with other specified complication: Secondary | ICD-10-CM | POA: Diagnosis present

## 2023-03-12 LAB — CBC WITH DIFFERENTIAL/PLATELET
Abs Immature Granulocytes: 0.04 10*3/uL (ref 0.00–0.07)
Basophils Absolute: 0 10*3/uL (ref 0.0–0.1)
Basophils Relative: 0 %
Eosinophils Absolute: 0.1 10*3/uL (ref 0.0–0.5)
Eosinophils Relative: 1 %
HCT: 40.5 % (ref 39.0–52.0)
Hemoglobin: 13.4 g/dL (ref 13.0–17.0)
Immature Granulocytes: 0 %
Lymphocytes Relative: 10 %
Lymphs Abs: 1.1 10*3/uL (ref 0.7–4.0)
MCH: 29.8 pg (ref 26.0–34.0)
MCHC: 33.1 g/dL (ref 30.0–36.0)
MCV: 90.2 fL (ref 80.0–100.0)
Monocytes Absolute: 1.1 10*3/uL — ABNORMAL HIGH (ref 0.1–1.0)
Monocytes Relative: 11 %
Neutro Abs: 8.2 10*3/uL — ABNORMAL HIGH (ref 1.7–7.7)
Neutrophils Relative %: 78 %
Platelets: 170 10*3/uL (ref 150–400)
RBC: 4.49 MIL/uL (ref 4.22–5.81)
RDW: 11.7 % (ref 11.5–15.5)
WBC: 10.6 10*3/uL — ABNORMAL HIGH (ref 4.0–10.5)
nRBC: 0 % (ref 0.0–0.2)

## 2023-03-12 LAB — I-STAT CG4 LACTIC ACID, ED
Lactic Acid, Venous: 1.3 mmol/L (ref 0.5–1.9)
Lactic Acid, Venous: 0.8 mmol/L (ref 0.5–1.9)

## 2023-03-12 LAB — CBG MONITORING, ED: Glucose-Capillary: 246 mg/dL — ABNORMAL HIGH (ref 70–99)

## 2023-03-12 LAB — HEMOGLOBIN A1C
Hgb A1c MFr Bld: 8.9 % — ABNORMAL HIGH (ref 4.8–5.6)
Mean Plasma Glucose: 208.73 mg/dL

## 2023-03-12 LAB — COMPREHENSIVE METABOLIC PANEL
ALT: 13 U/L (ref 0–44)
AST: 14 U/L — ABNORMAL LOW (ref 15–41)
Albumin: 3.4 g/dL — ABNORMAL LOW (ref 3.5–5.0)
Alkaline Phosphatase: 69 U/L (ref 38–126)
Anion gap: 12 (ref 5–15)
BUN: 18 mg/dL (ref 6–20)
CO2: 24 mmol/L (ref 22–32)
Calcium: 9.4 mg/dL (ref 8.9–10.3)
Chloride: 94 mmol/L — ABNORMAL LOW (ref 98–111)
Creatinine, Ser: 1.57 mg/dL — ABNORMAL HIGH (ref 0.61–1.24)
GFR, Estimated: 52 mL/min — ABNORMAL LOW (ref >60)
Glucose, Bld: 296 mg/dL — ABNORMAL HIGH (ref 70–99)
Potassium: 4.2 mmol/L (ref 3.5–5.1)
Sodium: 130 mmol/L — ABNORMAL LOW (ref 135–145)
Total Bilirubin: 1.4 mg/dL — ABNORMAL HIGH (ref 0.3–1.2)
Total Protein: 9.1 g/dL — ABNORMAL HIGH (ref 6.5–8.1)

## 2023-03-12 MED ORDER — KETOROLAC TROMETHAMINE 15 MG/ML IJ SOLN: 15.0000 mg | Freq: Four times a day (QID) | INTRAMUSCULAR | Status: DC | PRN

## 2023-03-12 MED ORDER — CARVEDILOL 6.25 MG PO TABS
6.2500 mg | ORAL_TABLET | Freq: Every morning | ORAL | Status: DC
  Administered 2023-03-13 – 2023-03-14 (×2): 6.25 mg via ORAL
  Filled 2023-03-12 (×2): qty 1

## 2023-03-12 MED ORDER — METRONIDAZOLE 500 MG/100ML IV SOLN
500.0000 mg | Freq: Two times a day (BID) | INTRAVENOUS | Status: DC
  Administered 2023-03-12 – 2023-03-14 (×4): 500 mg via INTRAVENOUS
  Filled 2023-03-12 (×4): qty 100

## 2023-03-12 MED ORDER — SODIUM CHLORIDE 0.9 % IV BOLUS
1000.0000 mL | Freq: Once | INTRAVENOUS | Status: AC
  Administered 2023-03-12: 1000 mL via INTRAVENOUS

## 2023-03-12 MED ORDER — HYDROMORPHONE HCL 1 MG/ML IJ SOLN
1.0000 mg | Freq: Once | INTRAMUSCULAR | Status: AC
  Administered 2023-03-12: 1 mg via INTRAVENOUS
  Filled 2023-03-12: qty 1

## 2023-03-12 MED ORDER — CARVEDILOL 12.5 MG PO TABS
12.5000 mg | ORAL_TABLET | Freq: Every evening | ORAL | Status: DC
  Administered 2023-03-13: 12.5 mg via ORAL
  Filled 2023-03-12: qty 1

## 2023-03-12 MED ORDER — TICAGRELOR 90 MG PO TABS
90.0000 mg | ORAL_TABLET | Freq: Two times a day (BID) | ORAL | Status: DC
  Administered 2023-03-12: 90 mg via ORAL
  Filled 2023-03-12 (×2): qty 1

## 2023-03-12 MED ORDER — HEPARIN SODIUM (PORCINE) 5000 UNIT/ML IJ SOLN
5000.0000 [IU] | Freq: Three times a day (TID) | INTRAMUSCULAR | Status: DC
  Administered 2023-03-12 – 2023-03-13 (×2): 5000 [IU] via SUBCUTANEOUS
  Filled 2023-03-12 (×2): qty 1

## 2023-03-12 MED ORDER — TRAZODONE HCL 50 MG PO TABS
25.0000 mg | ORAL_TABLET | Freq: Every evening | ORAL | Status: DC | PRN
  Administered 2023-03-12: 25 mg via ORAL
  Filled 2023-03-12 (×2): qty 1

## 2023-03-12 MED ORDER — CARVEDILOL 12.5 MG PO TABS: 12.5000 mg | ORAL_TABLET | Freq: Two times a day (BID) | ORAL | Status: DC

## 2023-03-12 MED ORDER — SODIUM CHLORIDE 0.9 % IV SOLN: INTRAVENOUS | Status: DC

## 2023-03-12 MED ORDER — PANTOPRAZOLE SODIUM 40 MG PO TBEC
40.0000 mg | DELAYED_RELEASE_TABLET | Freq: Two times a day (BID) | ORAL | Status: DC
  Administered 2023-03-12 – 2023-03-14 (×3): 40 mg via ORAL
  Filled 2023-03-12 (×3): qty 1

## 2023-03-12 MED ORDER — NITROGLYCERIN 0.4 MG SL SUBL: 0.4000 mg | SUBLINGUAL_TABLET | SUBLINGUAL | Status: DC | PRN

## 2023-03-12 MED ORDER — ROSUVASTATIN CALCIUM 20 MG PO TABS
40.0000 mg | ORAL_TABLET | Freq: Every day | ORAL | Status: DC
  Administered 2023-03-14: 40 mg via ORAL
  Filled 2023-03-12: qty 2

## 2023-03-12 MED ORDER — KETOROLAC TROMETHAMINE 15 MG/ML IJ SOLN
15.0000 mg | Freq: Four times a day (QID) | INTRAMUSCULAR | Status: AC
  Administered 2023-03-12 – 2023-03-13 (×3): 15 mg via INTRAVENOUS
  Filled 2023-03-12 (×3): qty 1

## 2023-03-12 MED ORDER — INSULIN DEGLUDEC 100 UNIT/ML ~~LOC~~ SOPN
38.0000 [IU] | PEN_INJECTOR | Freq: Every day | SUBCUTANEOUS | Status: DC
  Filled 2023-03-12: qty 3

## 2023-03-12 MED ORDER — ONDANSETRON HCL 4 MG/2ML IJ SOLN
4.0000 mg | Freq: Once | INTRAMUSCULAR | Status: AC
  Administered 2023-03-12: 4 mg via INTRAVENOUS
  Filled 2023-03-12: qty 2

## 2023-03-12 MED ORDER — ACETAMINOPHEN 325 MG PO TABS
650.0000 mg | ORAL_TABLET | Freq: Once | ORAL | Status: AC
  Administered 2023-03-12: 650 mg via ORAL
  Filled 2023-03-12: qty 2

## 2023-03-12 MED ORDER — SODIUM CHLORIDE 0.9 % IV SOLN
2.0000 g | Freq: Three times a day (TID) | INTRAVENOUS | Status: DC
  Administered 2023-03-13 – 2023-03-14 (×6): 2 g via INTRAVENOUS
  Filled 2023-03-12 (×7): qty 12.5

## 2023-03-12 MED ORDER — SODIUM CHLORIDE 0.9 % IV SOLN
2.0000 g | Freq: Once | INTRAVENOUS | Status: AC
  Administered 2023-03-12: 2 g via INTRAVENOUS
  Filled 2023-03-12: qty 12.5

## 2023-03-12 MED ORDER — INSULIN GLARGINE-YFGN 100 UNIT/ML ~~LOC~~ SOLN
38.0000 [IU] | Freq: Every day | SUBCUTANEOUS | Status: DC
  Administered 2023-03-12 – 2023-03-13 (×2): 38 [IU] via SUBCUTANEOUS
  Filled 2023-03-12 (×3): qty 0.38

## 2023-03-12 MED ORDER — IOHEXOL 300 MG/ML  SOLN
100.0000 mL | Freq: Once | INTRAMUSCULAR | Status: AC | PRN
  Administered 2023-03-12: 100 mL via INTRAVENOUS

## 2023-03-12 MED ORDER — ONDANSETRON 4 MG PO TBDP: 4.0000 mg | ORAL_TABLET | Freq: Three times a day (TID) | ORAL | Status: DC | PRN

## 2023-03-12 MED ORDER — INSULIN ASPART 100 UNIT/ML IJ SOLN
0.0000 [IU] | INTRAMUSCULAR | Status: DC
  Administered 2023-03-12 – 2023-03-13 (×2): 7 [IU] via SUBCUTANEOUS
  Administered 2023-03-13: 3 [IU] via SUBCUTANEOUS
  Administered 2023-03-13: 4 [IU] via SUBCUTANEOUS
  Administered 2023-03-13 – 2023-03-14 (×2): 7 [IU] via SUBCUTANEOUS
  Administered 2023-03-14: 3 [IU] via SUBCUTANEOUS
  Administered 2023-03-14: 7 [IU] via SUBCUTANEOUS
  Filled 2023-03-12: qty 0.2

## 2023-03-12 MED ORDER — CIPROFLOXACIN IN D5W 400 MG/200ML IV SOLN: 400.0000 mg | Freq: Once | INTRAVENOUS | Status: DC

## 2023-03-12 NOTE — ED Notes (Signed)
Patient transported to CT 

## 2023-03-12 NOTE — H&P (Signed)
History and Physical    Sean Schaefer ZOX:[id number] DOB: [birth year] DOA: 03/12/2023  DOS: the patient was seen and examined on 03/12/2023  PCP: Sean Dills, MD   Patient coming from: Home  I have personally briefly reviewed patient's old medical records in [facility]  Sean Schaefer, a 57 y/o with a medical hx of DM insulin dependent, CAD s/p MI and PCI/Stenting, HLD and colitis with multiple surgeries and procedures documented in his chart who has had a developing subcutaneous abscess lower left quadrant of the abdominal wall for over a week. He has pain with this and developed fever. He presents to [facility] for evaluation   ED Course: Tmax 100.8 to 98.9  123/83  HR 102  RR 18. Overweight man in no acute distress but complaining of pain LLQ abdominal wall. Lab Cr 1.57 (chronic) WBC 10.6 with 78/10/11. CT demonstrating new subcutaneous abscess LLQ abdomen. EDP consulted Dr. Maisie Fus for general surgery and Dr. Marina Goodell for GI. [facility] called to admit for continued management of infection and early sepsis.   Review of Systems:  Review of Systems  Constitutional:  Positive for fever. Negative for chills, diaphoresis and weight loss.  HENT: Negative.    Eyes: Negative.   Respiratory: Negative.    Cardiovascular:  Negative for chest pain and palpitations.  Gastrointestinal: Negative.   Genitourinary: Negative.   Musculoskeletal: Negative.   Skin:        Area of swelling and redness LLQ abdominal wall.  Neurological: Negative.   Endo/Heme/Allergies: Negative.   Psychiatric/Behavioral: Negative.      Past Medical History:  Diagnosis Date   CAD (coronary artery disease)    Crohn's disease (HCC)    Diabetes mellitus (HCC)    Dilation of aorta (HCC)    Hypertension    Ulcerative colitis (HCC)     Past Surgical History:  Procedure Laterality Date   BIOPSY  12/12/2022   Procedure: BIOPSY;  Surgeon: Benancio Deeds, MD;  Location: [facility] ENDOSCOPY;  Service: Gastroenterology;;   COLON  SURGERY     COLONOSCOPY     CORONARY BALLOON ANGIOPLASTY N/A 10/20/2022   Procedure: CORONARY BALLOON ANGIOPLASTY;  Surgeon: Marykay Lex, MD;  Location: [facility] INVASIVE CV LAB;  Service: Cardiovascular;  Laterality: N/A;   CORONARY STENT INTERVENTION N/A 10/20/2022   Procedure: CORONARY STENT INTERVENTION;  Surgeon: Marykay Lex, MD;  Location[facility] INVASIVE CV LAB;  Service: Cardiovascular;  Laterality: N/A;   ESOPHAGOGASTRODUODENOSCOPY (EGD) WITH PROPOFOL N/A 12/12/2022   Procedure: ESOPHAGOGASTRODUODENOSCOPY (EGD) WITH PROPOFOL;  Surgeon: Benancio Deeds, MD;  Location: [facility] ENDOSCOPY;  Service: Gastroenterology;  Laterality: N/A;   EYE SURGERY Left    FLEXIBLE SIGMOIDOSCOPY N/A 02/05/2021   Procedure: FLEXIBLE POUCHOSCOPY WITH BIOPSY;  Surgeon: Andria Meuse, MD;  Location: [facility] ORS;  Service: General;  Laterality: N/A;   INCISION AND DRAINAGE ABSCESS N/A 02/05/2021   Procedure: INCISION AND DRAINAGE OF PERIANAL ABSCESS;  Surgeon: Andria Meuse, MD;  Location: [facility] ORS;  Service: General;  Laterality: N/A;   INCISION AND DRAINAGE ABSCESS N/A 12/21/2022   Procedure: INCISION AND DRAINAGE ABSCESS;  Surgeon: Berna Bue, MD;  Location: [facility] OR;  Service: General;  Laterality: N/A;   INCISION AND DRAINAGE PERIRECTAL ABSCESS N/A 11/30/2020   Procedure: IRRIGATION AND DEBRIDEMENT PERIRECTAL ABSCESS;  Surgeon: Fritzi Mandes, MD;  Location: [facility] OR;  Service: General;  Laterality: N/A;   INCISION AND DRAINAGE PERIRECTAL ABSCESS Left 04/03/2021   Procedure: left peri-rectal/gluteal abscess;  Surgeon: Kris Mouton  N, MD;  Location: [facility] OR;  Service: General;  Laterality: Left;   LEFT HEART CATH AND CORONARY ANGIOGRAPHY N/A 10/20/2022   Procedure: LEFT HEART CATH AND CORONARY ANGIOGRAPHY;  Surgeon: Marykay Lex, MD;  Location: [facility] INVASIVE CV LAB;  Service: Cardiovascular;  Laterality: N/A;   Perianal fistula repair  07/22/2010   PLACEMENT OF SETON N/A 02/05/2021   Procedure:  PLACEMENT OF SETONS X2;  Surgeon: Andria Meuse, MD;  Location: [facility]S;  Service: General;  Laterality: N/A;   RECTAL EXAM UNDER ANESTHESIA N/A 02/05/2021   Procedure: ANORECTAL EXAM UNDER ANESTHESIA;  Surgeon: Andria Meuse, MD;  Location: [facility];  Service: General;  Laterality: N/A;   TESTICLE REMOVAL Left 2000    Soc Hx - married 27 years. Two sons 42 and 29 with the oldest looking at colleges. 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    reports that he has never smoked. He has never used smokeless tobacco. He reports that he does not drink alcohol and does not use drugs.  Allergies  Allergen Reactions   Statins Other (See Comments)    Myalgias      Family History  Problem Relation Age of Onset   Hypertension Mother    Cancer Mother    Hypertension Father    Inflammatory bowel disease Brother    Diabetes Paternal Uncle    Diabetes Paternal Grandmother    Diabetes Paternal Grandfather    Heart disease Neg Hx    Colon cancer Neg Hx    Esophageal cancer Neg Hx    Rectal cancer Neg Hx    Stomach cancer Neg Hx     Prior to Admission medications   Medication Sig Start Date End Date Taking? Authorizing Provider  aspirin EC 81 MG tablet Take 1 tablet (81 mg total) by mouth daily. Swallow whole. Patient taking differently: Take 81 mg by mouth in the morning. Swallow whole. 10/23/22  Yes Zannie Cove, MD  carvedilol (COREG) 12.5 MG tablet Take 6.25 mg ( 1/2 tablet of 12.5 mg)  in the morning and 12.5 mg  in the evening Patient taking differently: Take 6.25-12.5 mg by mouth See admin instructions. Take 6.25 mg by mouth in the morning and 12.5 mg at bedtime 12/06/22  Yes Marykay Lex, MD  insulin degludec (TRESIBA FLEXTOUCH) 100 UNIT/ML FlexTouch Pen Inject 38 Units into the skin daily. Patient taking differently: Inject 38 Units into the skin at  bedtime. 12/25/22  Yes Hongalgi, Maximino Greenland, MD  insulin lispro (HUMALOG) 100 UNIT/ML injection Inject 8 Units into the skin 3 (three) times daily with meals.   Yes [provider]  nitroGLYCERIN (NITROSTAT) 0.4 MG SL tablet Place 1 tablet (0.4 mg total) under the tongue every 5 (five) minutes as needed for chest pain. 11/04/22 03/12/23 Yes Monge, Petra Kuba, NP  ondansetron (ZOFRAN-ODT) 4 MG disintegrating tablet Dissolve 1 tablet (4 mg total) by mouth every 8 (eight) hours as needed for nausea or vomiting. 12/13/22  Yes Dorcas Carrow, MD  rosuvastatin (CRESTOR) 40 MG tablet Take 1 tablet (40 mg total) by mouth daily. 02/24/23  Yes Marykay Lex, MD  SKYRIZI 600 MG/10ML injection Inject 600 mg into the vein See admin instructions. Inject 600 mg administered by intravenous infusion over a period of at least one hour at Week 0, Week 4, and Week 8   Yes [provider]  ticagrelor (BRILINTA) 90 MG TABS tablet Take 1 tablet (90 mg) by mouth 2 times daily. Patient taking differently: Take 90 mg by mouth in the morning and at bedtime. 11/13/22 11/13/23 Yes Monge, Petra Kuba, NP  Continuous Glucose Sensor (FREESTYLE LIBRE 14 DAY SENSOR) MISC Apply 1 sensor to the back of upper arm every 14 days Patient not taking: Reported on 03/12/2023 10/23/22     pantoprazole (PROTONIX) 40 MG tablet Take 1 tablet (40 mg total) by mouth 2 (two) times daily. Patient not taking: Reported on 03/12/2023 12/13/22 12/13/23  Dorcas Carrow, MD  Risankizumab-rzaa [facility]) 180 MG/1.2ML SOCT Inject 1.2 mLs into the skin every 8 (eight) weeks. Patient not taking: Reported on 03/12/2023 04/14/23   Quentin Angst, MD    Physical Exam: Vitals:   03/12/23 1548 03/12/23 1715 03/12/23 1909  BP: 104/73 132/77 123/83  Pulse: (!) 118 (!) 110 (!) 102  Resp: 18 18 18   Temp: (!) 100.8 F (38.2 C)  98.9 F (37.2 C)  TempSrc: Oral  Oral  SpO2: 100% 95% 98%  Weight: 93.9 kg    Height: 5\' 10"  (1.778 m)      Physical Exam Vitals  and nursing note reviewed.  Constitutional:      General: He is not in acute distress.    Appearance: He is well-developed. He is not ill-appearing.  HENT:     Head: Normocephalic and atraumatic.     Mouth/Throat:     Mouth: Mucous membranes are moist.     Pharynx: Oropharynx is clear. No pharyngeal swelling.  Eyes:     General: No scleral icterus.    Extraocular Movements: Extraocular movements intact.     Pupils: Pupils are equal, round, and reactive to light.  Cardiovascular:     Rate and Rhythm: Regular rhythm. Tachycardia present.     Heart sounds: Normal heart sounds.  Pulmonary:     Effort: Pulmonary effort is normal.     Breath sounds: Normal breath sounds.  Abdominal:     General: Abdomen is protuberant. Bowel sounds are normal. There is no distension or abdominal bruit.     Palpations: Abdomen is soft. There is fluid wave. There is no shifting dullness.     Tenderness: There is abdominal tenderness in the left lower quadrant. There is no guarding.     Hernia: No hernia is present.     Comments: Ventral surgical scar, scar from old stoma site RLQ. Erythema, swelling, heat and tenderness LLQ superficial abdominal wall  Skin:    General: Skin is warm and dry.     Comments: Area 7 x 15 cm rubor, tumor, dolore,calore LLQ.  Neurological:     General: No focal deficit present.     Mental Status: He is alert and oriented to person, place, and time.     Cranial Nerves: No cranial nerve deficit.  Psychiatric:        Mood and Affect: Mood normal.        Behavior: Behavior normal.      Labs on Admission: I have personally reviewed following labs and imaging studies  CBC: Recent Labs  Lab 03/12/23 1624  WBC 10.6*  NEUTROABS 8.2*  HGB 13.4  HCT 40.5  MCV 90.2  PLT 170   Basic Metabolic Panel: Recent Labs  Lab 03/12/23 1624  NA 130*  K 4.2  CL 94*  CO2 24  GLUCOSE 296*  BUN 18  CREATININE 1.57*  CALCIUM 9.4   GFR: Estimated Creatinine Clearance: 61.2 mL/min  (  A) (by C-G formula based on SCr of 1.57 mg/dL (H)). Liver Function Tests: Recent Labs  Lab 03/12/23 1624  AST 14*  ALT 13  ALKPHOS 69  BILITOT 1.4*  PROT 9.1*  ALBUMIN 3.4*   No results for input(s): "LIPASE", "AMYLASE" in the last 168 hours. No results for input(s): "AMMONIA" in the last 168 hours. Coagulation Profile: No results for input(s): "INR", "PROTIME" in the last 168 hours. Cardiac Enzymes: No results for input(s): "CKTOTAL", "CKMB", "CKMBINDEX", "TROPONINI" in the last 168 hours. BNP (last 3 results) No results for input(s): "PROBNP" in the last 8760 hours. HbA1C: No results for input(s): "HGBA1C" in the last 72 hours. CBG: No results for input(s): "GLUCAP" in the last 168 hours. Lipid Profile: No results for input(s): "CHOL", "HDL", "LDLCALC", "TRIG", "CHOLHDL", "LDLDIRECT" in the last 72 hours. Thyroid Function Tests: No results for input(s): "TSH", "T4TOTAL", "FREET4", "T3FREE", "THYROIDAB" in the last 72 hours. Anemia Panel: No results for input(s): "VITAMINB12", "FOLATE", "FERRITIN", "TIBC", "IRON", "RETICCTPCT" in the last 72 hours. Urine analysis:    Component Value Date/Time   COLORURINE YELLOW 10/19/2022 1955   APPEARANCEUR CLEAR 10/19/2022 1955   LABSPEC 1.026 10/19/2022 1955   PHURINE 5.0 10/19/2022 1955   GLUCOSEU >=500 (A) 10/19/2022 1955   HGBUR SMALL (A) 10/19/2022 1955   BILIRUBINUR NEGATIVE 10/19/2022 1955   KETONESUR 5 (A) 10/19/2022 1955   PROTEINUR 100 (A) 10/19/2022 1955   NITRITE NEGATIVE 10/19/2022 1955   LEUKOCYTESUR NEGATIVE 10/19/2022 1955    Radiological Exams on Admission: I have personally reviewed images CT ABDOMEN PELVIS W CONTRAST  Result Date: 03/12/2023 CLINICAL DATA:  Crohn's disease with worsening left lower quadrant abdominal pain and drainage. Fever today. Known history of fistulas EXAM: CT ABDOMEN AND PELVIS WITH CONTRAST TECHNIQUE: Multidetector CT imaging of the abdomen and pelvis was performed using the standard  protocol following bolus administration of intravenous contrast. RADIATION DOSE REDUCTION: This exam was performed according to the departmental dose-optimization program which includes automated exposure control, adjustment of the mA and/or kV according to patient size and/or use of iterative reconstruction technique. CONTRAST:  OMNIPAQUE IOHEXOL 300 MG/ML  SOLN COMPARISON:  CT 12/20/2022 and older FINDINGS: Lower chest: There is some linear opacity lung bases likely scar or atelectasis. No pleural effusion. Hepatobiliary: No focal liver abnormality is seen. No gallbladder wall thickening, or biliary dilatation. Gallstones. Patent portal vein. Pancreas: Unremarkable. No pancreatic ductal dilatation or surrounding inflammatory changes. Spleen: Normal in size without focal abnormality. Adrenals/Urinary Tract: Adrenal glands are preserved. No enhancing renal mass or collecting system dilatation. The ureters have normal course and caliber extending down to the bladder preserved contours of the urinary bladder Stomach/Bowel: No oral contrast. The stomach is underdistended. Normal course and caliber of the duodenum. Surgical changes along the bowel with previous colectomy. Anastomosis at the rectosigmoid region with surgical changes. Once again there is some thickening in the presacral space with a small area of air and fluid posteriorly to the course of the bowel in this location. Please correlate for surgical change or contained leak. Again unchanged. Multiple setons are identified along the anorectal region with soft tissue thickening extending into the left ischioanal fossa and along the left gluteal cleft. The previous fluid collections are no longer identified, more residual soft tissue thickening. Vascular/Lymphatic: Normal caliber aorta and IVC with scattered vascular calcifications. There are some prominent pelvic nodes identified which could be reactive, pelvic sidewall and inguinal regions,  left-greater-than-right. Reproductive: Mildly enlarged prostate. Other: No free intra-air. There  is inflammatory stranding with soft tissue gas along the subcutaneous fat anterior to the left hip and along the left hemipelvis anterior laterally. This new from the prior CT scan. There is skin thickening and stranding in this location. There is no obvious tract extending into the peritoneal cavity in this location. Please correlate for soft tissue infection or other process. Musculoskeletal: Degenerative changes seen of the spine and pelvis. IMPRESSION: Surgical changes of previous subtotal colectomy with soft tissue thickening in the area of the surgical anastomosis in the low pelvis. There continues to be soft tissue thickening and some air with fluid extending into the presacral space, similar to previous. Multiple setons in place along the anal region consistent with multiple fistula. The fluid collection along the ischioanal fossa and fat deep to the gluteal cleft on the left is improving with significant residual soft tissue thickening and stranding. No soft tissue gas in this location. However there is a new area of stranding, skin thickening with soft tissue gas along the subcutaneous fat anterolateral along the left hemipelvis and anterior to the left hip. Please correlate for etiology including soft tissue injury or infection. No clear fistula track extends into the peritoneal space from this location. Please correlate with clinical history. Gallstones. Electronically Signed   By: Karen Kays M.D.   On: 03/12/2023 18:11    EKG: I have personally reviewed EKG: no new EKG  Assessment/Plan Principal Problem:   Abscess Active Problems:   Crohn's disease (HCC)   Diabetes (HCC)   Hyperlipidemia    Assessment and Plan: * Abscess Patient with a new subcutaneous abscess LLQ abdominal wall approx 20x20 cm on CT. Tmax in ED 100.8. WBC nl range at 10.6 with 78/10/11. He has been started on Cefipime and  Flagyl. Dr. Maisie Fus for General Surgery consulted  Plan Med-surg admit  GS will manage abscess treatment  Continue IV abx until abscess treated  Ketorolac 15 mg q6 for pain management  Hyperlipidemia Continue home regimen  Diabetes [facility]) Patient reports last A1C at Dr. Idelle Crouch office wss 7.5% July '24  Plan Continue [name]  Sliding scale coverage q4 while NPO  Crohn's disease (HCC) No new colonic abscess or fistula.  Plan Continue home regimen - injectable immunologic agent every 8 weeks  Dr. Marina Goodell for GI has been notified by EDP       DVT prophylaxis: SQ Heparin Code Status: Full Code Family Communication: spoke with Mrs. Salvador explaining Dx and Tx plan. She had no questions  Disposition Plan: home when medically stable  Consults called: General surgery - Dr. Maisie Fus; GI - Dr. Marina Goodell  Admission status: Inpatient, Med-Surg   Illene Regulus, MD [facility] 03/12/2023, 8:14 PM

## 2023-03-12 NOTE — Assessment & Plan Note (Signed)
Patient with a new subcutaneous abscess LLQ abdominal wall approx 20x20 cm on CT. Tmax in ED 100.8. WBC nl range at 10.6 with 78/10/11. He has been started on Cefipime and Flagyl. Dr. Maisie Fus for General Surgery consulted  Plan Med-surg admit  GS will manage abscess treatment  Continue IV abx until abscess treated  Ketorolac 15 mg q6 for pain management

## 2023-03-12 NOTE — Subjective & Objective (Signed)
Sean Schaefer, a 57 y/o with a medical hx of DM insulin dependent, CAD s/p MI and PCI/Stenting, HLD and colitis with multiple surgeries and procedures documented in his chart who has had a developing subcutaneous abscess lower left quadrant of the abdominal wall for over a week. He has pain with this and developed fever. He presents to [facility] for evaluation

## 2023-03-12 NOTE — Assessment & Plan Note (Signed)
-   Continue home regimen 

## 2023-03-12 NOTE — ED Notes (Signed)
ED TO INPATIENT HANDOFF REPORT  ED Nurse Name and Phone #: Linus Orn Name/Age/Gender Sean Schaefer 57 y.o. male Room/Bed: WA15/WA15  Code Status   Code Status: Full Code  Home/SNF/Other Home Patient oriented to: self, place, time, and situation Is this baseline? Yes   Triage Complete: Triage complete  Chief Complaint Abscess [L02.91]  Triage Note Pt coming in today developed a boil on the lower left quadrant of his abd. Pt states it began draining over the weekend. Pt developed a fever today of 100.8, and then decided to come to the ER. Pt has hx of chron's disease, and fistulas.    Allergies Allergies  Allergen Reactions   Statins Other (See Comments)    Myalgias      Level of Care/Admitting Diagnosis ED Disposition     ED Disposition  Admit   Condition  --   Comment  Hospital Area: [facility] [[id number]]  Level of Care: Med-Surg [16]  May admit patient to [facility] or [facility] if equivalent level of care is available:: No  Covid Evaluation: Asymptomatic - no recent exposure (last 10 days) testing not required  Diagnosis: Abscess [478295]  Admitting Physician: Jacques Navy [[id number]]  Attending Physician: Jacques Navy [[id number]]  Certification:: I certify this patient will need inpatient services for at least 2 midnights  Expected Medical Readiness: 03/15/2023          B Medical/Surgery History Past Medical History:  Diagnosis Date   CAD (coronary artery disease)    Crohn's disease (HCC)    Diabetes mellitus (HCC)    Dilation of aorta (HCC)    Hypertension    Ulcerative colitis (HCC)    Past Surgical History:  Procedure Laterality Date   BIOPSY  12/12/2022   Procedure: BIOPSY;  Surgeon: Benancio Deeds, MD;  Location: [facility] ENDOSCOPY;  Service: Gastroenterology;;   COLON SURGERY     COLONOSCOPY     CORONARY BALLOON ANGIOPLASTY N/A 10/20/2022   Procedure: CORONARY BALLOON ANGIOPLASTY;  Surgeon: Marykay Lex, MD;   Location: [facility] INVASIVE CV LAB;  Service: Cardiovascular;  Laterality: N/A;   CORONARY STENT INTERVENTION N/A 10/20/2022   Procedure: CORONARY STENT INTERVENTION;  Surgeon: Marykay Lex, MD;  Location: [facility] INVASIVE CV LAB;  Service: Cardiovascular;  Laterality: N/A;   ESOPHAGOGASTRODUODENOSCOPY (EGD) WITH PROPOFOL N/A 12/12/2022   Procedure: ESOPHAGOGASTRODUODENOSCOPY (EGD) WITH PROPOFOL;  Surgeon: Benancio Deeds, MD;  Location: [facility] ENDOSCOPY;  Service: Gastroenterology;  Laterality: N/A;   EYE SURGERY Left    FLEXIBLE SIGMOIDOSCOPY N/A 02/05/2021   Procedure: FLEXIBLE POUCHOSCOPY WITH BIOPSY;  Surgeon: Andria Meuse, MD;  Location: [facility] ORS;  Service: General;  Laterality: N/A;   INCISION AND DRAINAGE ABSCESS N/A 02/05/2021   Procedure: INCISION AND DRAINAGE OF PERIANAL ABSCESS;  Surgeon: Andria Meuse, MD;  Location: [facility] ORS;  Service: General;  Laterality: N/A;   INCISION AND DRAINAGE ABSCESS N/A 12/21/2022   Procedure: INCISION AND DRAINAGE ABSCESS;  Surgeon: Berna Bue, MD;  Location: [facility] OR;  Service: General;  Laterality: N/A;   INCISION AND DRAINAGE PERIRECTAL ABSCESS N/A 11/30/2020   Procedure: IRRIGATION AND DEBRIDEMENT PERIRECTAL ABSCESS;  Surgeon: Fritzi Mandes, MD;  Location: [facility] OR;  Service: General;  Laterality: N/A;   INCISION AND DRAINAGE PERIRECTAL ABSCESS Left 04/03/2021   Procedure: left peri-rectal/gluteal abscess;  Surgeon: Diamantina Monks, MD;  Location: [facility] OR;  Service: General;  Laterality: Left;   LEFT HEART CATH AND CORONARY ANGIOGRAPHY N/A 10/20/2022  Procedure: LEFT HEART CATH AND CORONARY ANGIOGRAPHY;  Surgeon: Marykay Lex, MD;  Location: [facility] INVASIVE CV LAB;  Service: Cardiovascular;  Laterality: N/A;   Perianal fistula repair  07/22/2010   PLACEMENT OF SETON N/A 02/05/2021   Procedure: PLACEMENT OF SETONS X2;  Surgeon: Andria Meuse, MD;  Location: [facility] ORS;  Service: General;  Laterality: N/A;   RECTAL EXAM UNDER ANESTHESIA N/A  02/05/2021   Procedure: ANORECTAL EXAM UNDER ANESTHESIA;  Surgeon: Andria Meuse, MD;  Location: [facility] ORS;  Service: General;  Laterality: N/A;   TESTICLE REMOVAL Left 2000     A IV Location/Drains/Wounds Patient Lines/Drains/Airways Status     Active Line/Drains/Airways     Name Placement date Placement time Site Days   Peripheral IV 03/12/23 20 G 1" Right Antecubital 03/12/23  1629  Antecubital  less than 1            Intake/Output Last 24 hours  Intake/Output Summary (Last 24 hours) at 03/12/2023 2115 Last data filed at 03/12/2023 1853 Gross per 24 hour  Intake 200.23 ml  Output --  Net 200.23 ml    Labs/Imaging Results for orders placed or performed during the hospital encounter of 03/12/23 (from the past 48 hour(s))  Comprehensive metabolic panel     Status: Abnormal   Collection Time: 03/12/23  4:24 PM  Result Value Ref Range   Sodium 130 (L) 135 - 145 mmol/L   Potassium 4.2 3.5 - 5.1 mmol/L   Chloride 94 (L) 98 - 111 mmol/L   CO2 24 22 - 32 mmol/L   Glucose, Bld 296 (H) 70 - 99 mg/dL    Comment: Glucose reference range applies only to samples taken after fasting for at least 8 hours.   BUN 18 6 - 20 mg/dL   Creatinine, Ser 1.60 (H) 0.61 - 1.24 mg/dL   Calcium 9.4 8.9 - 10.9 mg/dL   Total Protein 9.1 (H) 6.5 - 8.1 g/dL   Albumin 3.4 (L) 3.5 - 5.0 g/dL   AST 14 (L) 15 - 41 U/L   ALT 13 0 - 44 U/L   Alkaline Phosphatase 69 38 - 126 U/L   Total Bilirubin 1.4 (H) 0.3 - 1.2 mg/dL   GFR, Estimated 52 (L) >60 mL/min    Comment: (NOTE) Calculated using the CKD-EPI Creatinine Equation (2021)    Anion gap 12 5 - 15    Comment: Performed at [facility], [address], [place], [place] [address]  CBC with Differential     Status: Abnormal   Collection Time: 03/12/23  4:24 PM  Result Value Ref Range   WBC 10.6 (H) 4.0 - 10.5 K/uL   RBC 4.49 4.22 - 5.81 MIL/uL   Hemoglobin 13.4 13.0 - 17.0 g/dL   HCT 73.2 20.2 - 54.2 %   MCV 90.2 80.0 -  100.0 fL   MCH 29.8 26.0 - 34.0 pg   MCHC 33.1 30.0 - 36.0 g/dL   RDW 70.6 23.7 - 62.8 %   Platelets 170 150 - 400 K/uL   nRBC 0.0 0.0 - 0.2 %   Neutrophils Relative % 78 %   Neutro Abs 8.2 (H) 1.7 - 7.7 K/uL   Lymphocytes Relative 10 %   Lymphs Abs 1.1 0.7 - 4.0 K/uL   Monocytes Relative 11 %   Monocytes Absolute 1.1 (H) 0.1 - 1.0 K/uL   Eosinophils Relative 1 %   Eosinophils Absolute 0.1 0.0 - 0.5 K/uL   Basophils Relative 0 %  Basophils Absolute 0.0 0.0 - 0.1 K/uL   Immature Granulocytes 0 %   Abs Immature Granulocytes 0.04 0.00 - 0.07 K/uL    Comment: Performed at [facility], [address] [place], [place] [address]  I-Stat Lactic Acid, ED     Status: None   Collection Time: 03/12/23  4:45 PM  Result Value Ref Range   Lactic Acid, Venous 1.3 0.5 - 1.9 mmol/L  I-Stat Lactic Acid, ED     Status: None   Collection Time: 03/12/23  7:53 PM  Result Value Ref Range   Lactic Acid, Venous 0.8 0.5 - 1.9 mmol/L  CBG monitoring, ED     Status: Abnormal   Collection Time: 03/12/23  8:32 PM  Result Value Ref Range   Glucose-Capillary 246 (H) 70 - 99 mg/dL    Comment: Glucose reference range applies only to samples taken after fasting for at least 8 hours.   CT ABDOMEN PELVIS W CONTRAST  Result Date: 03/12/2023 CLINICAL DATA:  Crohn's disease with worsening left lower quadrant abdominal pain and drainage. Fever today. Known history of fistulas EXAM: CT ABDOMEN AND PELVIS WITH CONTRAST TECHNIQUE: Multidetector CT imaging of the abdomen and pelvis was performed using the standard protocol following bolus administration of intravenous contrast. RADIATION DOSE REDUCTION: This exam was performed according to the departmental dose-optimization program which includes automated exposure control, adjustment of the mA and/or kV according to patient size and/or use of iterative reconstruction technique. CONTRAST:  OMNIPAQUE IOHEXOL 300 MG/ML  SOLN COMPARISON:  CT 12/20/2022  and older FINDINGS: Lower chest: There is some linear opacity lung bases likely scar or atelectasis. No pleural effusion. Hepatobiliary: No focal liver abnormality is seen. No gallbladder wall thickening, or biliary dilatation. Gallstones. Patent portal vein. Pancreas: Unremarkable. No pancreatic ductal dilatation or surrounding inflammatory changes. Spleen: Normal in size without focal abnormality. Adrenals/Urinary Tract: Adrenal glands are preserved. No enhancing renal mass or collecting system dilatation. The ureters have normal course and caliber extending down to the bladder preserved contours of the urinary bladder Stomach/Bowel: No oral contrast. The stomach is underdistended. Normal course and caliber of the duodenum. Surgical changes along the bowel with previous colectomy. Anastomosis at the rectosigmoid region with surgical changes. Once again there is some thickening in the presacral space with a small area of air and fluid posteriorly to the course of the bowel in this location. Please correlate for surgical change or contained leak. Again unchanged. Multiple setons are identified along the anorectal region with soft tissue thickening extending into the left ischioanal fossa and along the left gluteal cleft. The previous fluid collections are no longer identified, more residual soft tissue thickening. Vascular/Lymphatic: Normal caliber aorta and IVC with scattered vascular calcifications. There are some prominent pelvic nodes identified which could be reactive, pelvic sidewall and inguinal regions, left-greater-than-right. Reproductive: Mildly enlarged prostate. Other: No free intra-air. There is inflammatory stranding with soft tissue gas along the subcutaneous fat anterior to the left hip and along the left hemipelvis anterior laterally. This new from the prior CT scan. There is skin thickening and stranding in this location. There is no obvious tract extending into the peritoneal cavity in this  location. Please correlate for soft tissue infection or other process. Musculoskeletal: Degenerative changes seen of the spine and pelvis. IMPRESSION: Surgical changes of previous subtotal colectomy with soft tissue thickening in the area of the surgical anastomosis in the low pelvis. There continues to be soft tissue thickening and some air with fluid extending  into the presacral space, similar to previous. Multiple setons in place along the anal region consistent with multiple fistula. The fluid collection along the ischioanal fossa and fat deep to the gluteal cleft on the left is improving with significant residual soft tissue thickening and stranding. No soft tissue gas in this location. However there is a new area of stranding, skin thickening with soft tissue gas along the subcutaneous fat anterolateral along the left hemipelvis and anterior to the left hip. Please correlate for etiology including soft tissue injury or infection. No clear fistula track extends into the peritoneal space from this location. Please correlate with clinical history. Gallstones. Electronically Signed   By: Karen Kays M.D.   On: 03/12/2023 18:11    Pending Labs Unresulted Labs (From admission, onward)     Start     Ordered   03/12/23 1947  Hemoglobin A1c  Add-on,   AD        03/12/23 1947   03/12/23 1610  Culture, blood (Routine x 2)  BLOOD CULTURE X 2,   R (with STAT occurrences)      03/12/23 1609   03/12/23 1610  Urinalysis, w/ Reflex to Culture (Infection Suspected) -Urine, Clean Catch  Once,   URGENT       Question:  Specimen Source  Answer:  Urine, Clean Catch   03/12/23 1609            Vitals/Pain Today's Vitals   03/12/23 1715 03/12/23 1907 03/12/23 1909 03/12/23 2041  BP: 132/77  123/83   Pulse: (!) 110  (!) 102   Resp: 18  18   Temp:   98.9 F (37.2 C)   TempSrc:   Oral   SpO2: 95%  98%   Weight:      Height:      PainSc:  10-Worst pain ever  7     Isolation Precautions No active  isolations  Medications Medications  metroNIDAZOLE (FLAGYL) IVPB 500 mg (0 mg Intravenous Stopped 03/12/23 1853)  nitroGLYCERIN (NITROSTAT) SL tablet 0.4 mg (has no administration in time range)  rosuvastatin (CRESTOR) tablet 40 mg (has no administration in time range)  ondansetron (ZOFRAN-ODT) disintegrating tablet 4 mg (has no administration in time range)  pantoprazole (PROTONIX) EC tablet 40 mg (has no administration in time range)  ticagrelor (BRILINTA) tablet 90 mg (has no administration in time range)  insulin aspart (novoLOG) injection 0-20 Units (7 Units Subcutaneous Given 03/12/23 2040)  heparin injection 5,000 Units (has no administration in time range)  0.9 %  sodium chloride infusion (has no administration in time range)  traZODone (DESYREL) tablet 25 mg (has no administration in time range)  carvedilol (COREG) tablet 6.25 mg (has no administration in time range)    And  carvedilol (COREG) tablet 12.5 mg (has no administration in time range)  ceFEPIme (MAXIPIME) 2 g in sodium chloride 0.9 % 100 mL IVPB (has no administration in time range)  ketorolac (TORADOL) 15 MG/ML injection 15 mg (15 mg Intravenous Given 03/12/23 2038)  insulin glargine-yfgn (SEMGLEE) injection 38 Units (has no administration in time range)  acetaminophen (TYLENOL) tablet 650 mg (650 mg Oral Given 03/12/23 1630)  sodium chloride 0.9 % bolus 1,000 mL (1,000 mLs Intravenous New Bag/Given 03/12/23 1644)  HYDROmorphone (DILAUDID) injection 1 mg (1 mg Intravenous Given 03/12/23 1646)  ondansetron (ZOFRAN) injection 4 mg (4 mg Intravenous Given 03/12/23 1645)  ceFEPIme (MAXIPIME) 2 g in sodium chloride 0.9 % 100 mL IVPB (0 g Intravenous Stopped 03/12/23 1720)  iohexol (OMNIPAQUE) 300 MG/ML solution 100 mL (100 mLs Intravenous Contrast Given 03/12/23 1723)  HYDROmorphone (DILAUDID) injection 1 mg (1 mg Intravenous Given 03/12/23 1943)    Mobility walks     Focused Assessments Cardiac Assessment Handoff:    No  results found for: "CKTOTAL", "CKMB", "CKMBINDEX", "TROPONINI" No results found for: "DDIMER" Does the Patient currently have chest pain? No    R Recommendations: See Admitting Provider Note  Report given to:   Additional Notes: aaox4, ambulates.

## 2023-03-12 NOTE — Consult Note (Signed)
CC: abscess  Requesting provider: Dr Silverio Lay  HPI: Sean Schaefer is an 57 y.o. male with Crohn's who is here for worsening L hip pain and low grade fevers. 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  He has failed anti TNF biologics and is now on [employer].  He follows with Dr Myrtie Neither.  He is scheduled to f/u with [facility] GI in the future as well.  Last surgery here was I&D of perineal abscess in June with Dr Fredricka Bonine.  He now presents with worsening L hip SQ pain and fevers.  He has had some drainage in this area for the last week.   Wbc is 10.6.  CT shows some pelvic inflammation, chronic appearing perineal disease and some LLQ abd wall inflammation with soft tissue gas concerning for injury or infection.  Pt follows with Dr Cliffton Asters as an outpatient.   Past Medical History:  Diagnosis Date   CAD (coronary artery disease)    Crohn's disease (HCC)    Diabetes mellitus (HCC)    Dilation of aorta (HCC)    Hypertension    Ulcerative colitis (HCC)     Past Surgical History:  Procedure Laterality Date   BIOPSY  12/12/2022   Procedure: BIOPSY;  Surgeon: Benancio Deeds, MD;  Location: [facility] ENDOSCOPY;  Service: Gastroenterology;;   COLON SURGERY     COLONOSCOPY     CORONARY BALLOON ANGIOPLASTY N/A 10/20/2022   Procedure: CORONARY BALLOON ANGIOPLASTY;  Surgeon: Marykay Lex, MD;  Location: [facility] INVASIVE CV LAB;  Service: Cardiovascular;  Laterality: N/A;   CORONARY STENT INTERVENTION N/A 10/20/2022   Procedure: CORONARY STENT INTERVENTION;  Surgeon: Marykay Lex, MD;  Location: [facility] INVASIVE CV LAB;  Service: Cardiovascular;  Laterality: N/A;   ESOPHAGOGASTRODUODENOSCOPY (EGD) WITH PROPOFOL N/A 12/12/2022   Procedure: ESOPHAGOGASTRODUODENOSCOPY (EGD) WITH PROPOFOL;  Surgeon: Benancio Deeds, MD;  Location: [facility] ENDOSCOPY;  Service: Gastroenterology;  Laterality: N/A;   EYE SURGERY Left    FLEXIBLE SIGMOIDOSCOPY N/A 02/05/2021   Procedure: FLEXIBLE POUCHOSCOPY WITH BIOPSY;   Surgeon: Andria Meuse, MD;  Location: [facility] ORS;  Service: General;  Laterality: N/A;   INCISION AND DRAINAGE ABSCESS N/A 02/05/2021   Procedure: INCISION AND DRAINAGE OF PERIANAL ABSCESS;  Surgeon: Andria Meuse, MD;  Location: [facility] ORS;  Service: General;  Laterality: N/A;   INCISION AND DRAINAGE ABSCESS N/A 12/21/2022   Procedure: INCISION AND DRAINAGE ABSCESS;  Surgeon: Berna Bue, MD;  Location: [facility] OR;  Service: General;  Laterality: N/A;   INCISION AND DRAINAGE PERIRECTAL ABSCESS N/A 11/30/2020   Procedure: IRRIGATION AND DEBRIDEMENT PERIRECTAL ABSCESS;  Surgeon: Fritzi Mandes, MD;  Location: [facility] OR;  Service: General;  Laterality: N/A;   INCISION AND DRAINAGE PERIRECTAL ABSCESS Left 04/03/2021   Procedure: left peri-rectal/gluteal abscess;  Surgeon: Diamantina Monks, MD;  Location: [facility] OR;  Service: General;  Laterality: Left;   LEFT HEART CATH AND CORONARY ANGIOGRAPHY N/A 10/20/2022   Procedure: LEFT HEART CATH AND CORONARY ANGIOGRAPHY;  Surgeon: Marykay Lex, MD;  Location: [facility] INVASIVE CV LAB;  Service: Cardiovascular;  Laterality: N/A;   Perianal fistula repair  07/22/2010   PLACEMENT OF SETON N/A 02/05/2021   Procedure: PLACEMENT OF SETONS X2;  Surgeon: Andria Meuse, MD;  Location: [facility] ORS;  Service: General;  Laterality: N/A;   RECTAL EXAM UNDER ANESTHESIA N/A 02/05/2021   Procedure: ANORECTAL EXAM UNDER ANESTHESIA;  Surgeon: Andria Meuse, MD;  Location: [facility] ORS;  Service: General;  Laterality: N/A;   TESTICLE REMOVAL Left 2000    Family History  Problem Relation Age of Onset   Hypertension Mother    Cancer Mother    Hypertension Father    Inflammatory bowel disease Brother    Diabetes Paternal Uncle    Diabetes Paternal Grandmother    Diabetes Paternal Grandfather    Heart disease Neg Hx    Colon cancer Neg Hx    Esophageal cancer Neg Hx    Rectal cancer Neg Hx    Stomach cancer Neg Hx     Social:  reports that he has never smoked. He  has never used smokeless tobacco. He reports that he does not drink alcohol and does not use drugs.  Allergies:  Allergies  Allergen Reactions   Statins Other (See Comments)    Myalgias  Currently taking Crestor 10mg .    Medications: I have reviewed the patient's current medications.  Results for orders placed or performed during the hospital encounter of 03/12/23 (from the past 48 hour(s))  Comprehensive metabolic panel     Status: Abnormal   Collection Time: 03/12/23  4:24 PM  Result Value Ref Range   Sodium 130 (L) 135 - 145 mmol/L   Potassium 4.2 3.5 - 5.1 mmol/L   Chloride 94 (L) 98 - 111 mmol/L   CO2 24 22 - 32 mmol/L   Glucose, Bld 296 (H) 70 - 99 mg/dL    Comment: Glucose reference range applies only to samples taken after fasting for at least 8 hours.   BUN 18 6 - 20 mg/dL   Creatinine, Ser 4.09 (H) 0.61 - 1.24 mg/dL   Calcium 9.4 8.9 - 81.1 mg/dL   Total Protein 9.1 (H) 6.5 - 8.1 g/dL   Albumin 3.4 (L) 3.5 - 5.0 g/dL   AST 14 (L) 15 - 41 U/L   ALT 13 0 - 44 U/L   Alkaline Phosphatase 69 38 - 126 U/L   Total Bilirubin 1.4 (H) 0.3 - 1.2 mg/dL   GFR, Estimated 52 (L) >60 mL/min    Comment: (NOTE) Calculated using the CKD-EPI Creatinine Equation (2021)    Anion gap 12 5 - 15    Comment: Performed at [facility], [address] [place], [place] [address]  CBC with Differential     Status: Abnormal   Collection Time: 03/12/23  4:24 PM  Result Value Ref Range   WBC 10.6 (H) 4.0 - 10.5 K/uL   RBC 4.49 4.22 - 5.81 MIL/uL   Hemoglobin 13.4 13.0 - 17.0 g/dL   HCT 29.5 62.1 - 30.8 %   MCV 90.2 80.0 - 100.0 fL   MCH 29.8 26.0 - 34.0 pg   MCHC 33.1 30.0 - 36.0 g/dL   RDW 65.7 84.6 - 96.2 %   Platelets 170 150 - 400 K/uL   nRBC 0.0 0.0 - 0.2 %   Neutrophils Relative % 78 %   Neutro Abs 8.2 (H) 1.7 - 7.7 K/uL   Lymphocytes Relative 10 %   Lymphs Abs 1.1 0.7 - 4.0 K/uL   Monocytes Relative 11 %   Monocytes Absolute 1.1 (H) 0.1 - 1.0 K/uL    Eosinophils Relative 1 %   Eosinophils Absolute 0.1 0.0 - 0.5 K/uL   Basophils Relative 0 %   Basophils Absolute 0.0 0.0 - 0.1 K/uL   Immature Granulocytes 0 %   Abs Immature Granulocytes 0.04 0.00 - 0.07 K/uL    Comment: Performed at [facility], [address], [place], [place] [address]  I-Stat Lactic Acid, ED     Status: None   Collection Time: 03/12/23  4:45 PM  Result Value Ref Range   Lactic Acid, Venous 1.3 0.5 - 1.9 mmol/L    CT ABDOMEN PELVIS W CONTRAST  Result Date: 03/12/2023 CLINICAL DATA:  Crohn's disease with worsening left lower quadrant abdominal pain and drainage. Fever today. Known history of fistulas EXAM: CT ABDOMEN AND PELVIS WITH CONTRAST TECHNIQUE: Multidetector CT imaging of the abdomen and pelvis was performed using the standard protocol following bolus administration of intravenous contrast. RADIATION DOSE REDUCTION: This exam was performed according to the departmental dose-optimization program which includes automated exposure control, adjustment of the mA and/or kV according to patient size and/or use of iterative reconstruction technique. CONTRAST:  OMNIPAQUE IOHEXOL 300 MG/ML  SOLN COMPARISON:  CT 12/20/2022 and older FINDINGS: Lower chest: There is some linear opacity lung bases likely scar or atelectasis. No pleural effusion. Hepatobiliary: No focal liver abnormality is seen. No gallbladder wall thickening, or biliary dilatation. Gallstones. Patent portal vein. Pancreas: Unremarkable. No pancreatic ductal dilatation or surrounding inflammatory changes. Spleen: Normal in size without focal abnormality. Adrenals/Urinary Tract: Adrenal glands are preserved. No enhancing renal mass or collecting system dilatation. The ureters have normal course and caliber extending down to the bladder preserved contours of the urinary bladder Stomach/Bowel: No oral contrast. The stomach is underdistended. Normal course and caliber of the duodenum. Surgical  changes along the bowel with previous colectomy. Anastomosis at the rectosigmoid region with surgical changes. Once again there is some thickening in the presacral space with a small area of air and fluid posteriorly to the course of the bowel in this location. Please correlate for surgical change or contained leak. Again unchanged. Multiple setons are identified along the anorectal region with soft tissue thickening extending into the left ischioanal fossa and along the left gluteal cleft. The previous fluid collections are no longer identified, more residual soft tissue thickening. Vascular/Lymphatic: Normal caliber aorta and IVC with scattered vascular calcifications. There are some prominent pelvic nodes identified which could be reactive, pelvic sidewall and inguinal regions, left-greater-than-right. Reproductive: Mildly enlarged prostate. Other: No free intra-air. There is inflammatory stranding with soft tissue gas along the subcutaneous fat anterior to the left hip and along the left hemipelvis anterior laterally. This new from the prior CT scan. There is skin thickening and stranding in this location. There is no obvious tract extending into the peritoneal cavity in this location. Please correlate for soft tissue infection or other process. Musculoskeletal: Degenerative changes seen of the spine and pelvis. IMPRESSION: Surgical changes of previous subtotal colectomy with soft tissue thickening in the area of the surgical anastomosis in the low pelvis. There continues to be soft tissue thickening and some air with fluid extending into the presacral space, similar to previous. Multiple setons in place along the anal region consistent with multiple fistula. The fluid collection along the ischioanal fossa and fat deep to the gluteal cleft on the left is improving with significant residual soft tissue thickening and stranding. No soft tissue gas in this location. However there is a new area of stranding, skin  thickening with soft tissue gas along the subcutaneous fat anterolateral along the left hemipelvis and anterior to the left hip. Please correlate for etiology including soft tissue injury or infection. No clear fistula track extends into the peritoneal space from this location. Please correlate with clinical history. Gallstones. Electronically Signed   By: Piedad Climes.D.  On: 03/12/2023 18:11    ROS - all of the below systems have been reviewed with the patient and positives are indicated with bold text General: chills, fever or night sweats Eyes: blurry vision or double vision ENT: epistaxis or sore throat Hematologic/Lymphatic: bleeding problems, blood clots or swollen lymph nodes Endocrine: temperature intolerance or unexpected weight changes Breast: new or changing breast lumps or nipple discharge Resp: cough, shortness of breath, or wheezing CV: chest pain or dyspnea on exertion GI: as per HPI GU: dysuria, trouble voiding, or hematuria Neuro: TIA or stroke symptoms    PE Blood pressure 132/77, pulse (!) 110, temperature (!) 100.8 F (38.2 C), temperature source Oral, resp. rate 18, height 5\' 10"  (1.778 m), weight 93.9 kg, SpO2 95%. Constitutional: NAD; conversant; no deformities Eyes: Moist conjunctiva; no lid lag; anicteric; PERRL Neck: Trachea midline; no thyromegaly Lungs: Normal respiratory effort CV: RRR GI: Abd cellulitis and purulent discharge with fluctuance in the LLQ MSK: Normal range of motion of extremities; no clubbing/cyanosis Psychiatric: Appropriate affect; alert and oriented x3  Results for orders placed or performed during the hospital encounter of 03/12/23 (from the past 48 hour(s))  Comprehensive metabolic panel     Status: Abnormal   Collection Time: 03/12/23  4:24 PM  Result Value Ref Range   Sodium 130 (L) 135 - 145 mmol/L   Potassium 4.2 3.5 - 5.1 mmol/L   Chloride 94 (L) 98 - 111 mmol/L   CO2 24 22 - 32 mmol/L   Glucose, Bld 296 (H) 70 - 99 mg/dL     Comment: Glucose reference range applies only to samples taken after fasting for at least 8 hours.   BUN 18 6 - 20 mg/dL   Creatinine, Ser 4.62 (H) 0.61 - 1.24 mg/dL   Calcium 9.4 8.9 - 70.3 mg/dL   Total Protein 9.1 (H) 6.5 - 8.1 g/dL   Albumin 3.4 (L) 3.5 - 5.0 g/dL   AST 14 (L) 15 - 41 U/L   ALT 13 0 - 44 U/L   Alkaline Phosphatase 69 38 - 126 U/L   Total Bilirubin 1.4 (H) 0.3 - 1.2 mg/dL   GFR, Estimated 52 (L) >60 mL/min    Comment: (NOTE) Calculated using the CKD-EPI Creatinine Equation (2021)    Anion gap 12 5 - 15    Comment: Performed at [facility], [address], [place], [place] [address]  CBC with Differential     Status: Abnormal   Collection Time: 03/12/23  4:24 PM  Result Value Ref Range   WBC 10.6 (H) 4.0 - 10.5 K/uL   RBC 4.49 4.22 - 5.81 MIL/uL   Hemoglobin 13.4 13.0 - 17.0 g/dL   HCT 81.8 29.9 - 37.1 %   MCV 90.2 80.0 - 100.0 fL   MCH 29.8 26.0 - 34.0 pg   MCHC 33.1 30.0 - 36.0 g/dL   RDW 69.6 78.9 - 38.1 %   Platelets 170 150 - 400 K/uL   nRBC 0.0 0.0 - 0.2 %   Neutrophils Relative % 78 %   Neutro Abs 8.2 (H) 1.7 - 7.7 K/uL   Lymphocytes Relative 10 %   Lymphs Abs 1.1 0.7 - 4.0 K/uL   Monocytes Relative 11 %   Monocytes Absolute 1.1 (H) 0.1 - 1.0 K/uL   Eosinophils Relative 1 %   Eosinophils Absolute 0.1 0.0 - 0.5 K/uL   Basophils Relative 0 %   Basophils Absolute 0.0 0.0 - 0.1 K/uL   Immature Granulocytes 0 %  Abs Immature Granulocytes 0.04 0.00 - 0.07 K/uL    Comment: Performed at [facility], [address], [place], [place] [address]  I-Stat Lactic Acid, ED     Status: None   Collection Time: 03/12/23  4:45 PM  Result Value Ref Range   Lactic Acid, Venous 1.3 0.5 - 1.9 mmol/L    CT ABDOMEN PELVIS W CONTRAST  Result Date: 03/12/2023 CLINICAL DATA:  Crohn's disease with worsening left lower quadrant abdominal pain and drainage. Fever today. Known history of fistulas EXAM: CT ABDOMEN AND PELVIS WITH  CONTRAST TECHNIQUE: Multidetector CT imaging of the abdomen and pelvis was performed using the standard protocol following bolus administration of intravenous contrast. RADIATION DOSE REDUCTION: This exam was performed according to the departmental dose-optimization program which includes automated exposure control, adjustment of the mA and/or kV according to patient size and/or use of iterative reconstruction technique. CONTRAST:  OMNIPAQUE IOHEXOL 300 MG/ML  SOLN COMPARISON:  CT 12/20/2022 and older FINDINGS: Lower chest: There is some linear opacity lung bases likely scar or atelectasis. No pleural effusion. Hepatobiliary: No focal liver abnormality is seen. No gallbladder wall thickening, or biliary dilatation. Gallstones. Patent portal vein. Pancreas: Unremarkable. No pancreatic ductal dilatation or surrounding inflammatory changes. Spleen: Normal in size without focal abnormality. Adrenals/Urinary Tract: Adrenal glands are preserved. No enhancing renal mass or collecting system dilatation. The ureters have normal course and caliber extending down to the bladder preserved contours of the urinary bladder Stomach/Bowel: No oral contrast. The stomach is underdistended. Normal course and caliber of the duodenum. Surgical changes along the bowel with previous colectomy. Anastomosis at the rectosigmoid region with surgical changes. Once again there is some thickening in the presacral space with a small area of air and fluid posteriorly to the course of the bowel in this location. Please correlate for surgical change or contained leak. Again unchanged. Multiple setons are identified along the anorectal region with soft tissue thickening extending into the left ischioanal fossa and along the left gluteal cleft. The previous fluid collections are no longer identified, more residual soft tissue thickening. Vascular/Lymphatic: Normal caliber aorta and IVC with scattered vascular calcifications. There are some  prominent pelvic nodes identified which could be reactive, pelvic sidewall and inguinal regions, left-greater-than-right. Reproductive: Mildly enlarged prostate. Other: No free intra-air. There is inflammatory stranding with soft tissue gas along the subcutaneous fat anterior to the left hip and along the left hemipelvis anterior laterally. This new from the prior CT scan. There is skin thickening and stranding in this location. There is no obvious tract extending into the peritoneal cavity in this location. Please correlate for soft tissue infection or other process. Musculoskeletal: Degenerative changes seen of the spine and pelvis. IMPRESSION: Surgical changes of previous subtotal colectomy with soft tissue thickening in the area of the surgical anastomosis in the low pelvis. There continues to be soft tissue thickening and some air with fluid extending into the presacral space, similar to previous. Multiple setons in place along the anal region consistent with multiple fistula. The fluid collection along the ischioanal fossa and fat deep to the gluteal cleft on the left is improving with significant residual soft tissue thickening and stranding. No soft tissue gas in this location. However there is a new area of stranding, skin thickening with soft tissue gas along the subcutaneous fat anterolateral along the left hemipelvis and anterior to the left hip. Please correlate for etiology including soft tissue injury or infection. No clear fistula track extends into the  peritoneal space from this location. Please correlate with clinical history. Gallstones. Electronically Signed   By: Karen Kays M.D.   On: 03/12/2023 18:11     A/P: Sean Schaefer is an 57 y.o. male with L hip SQ abscess.  No clear fistulous communication on CT but pt does have active pelvic inflammation.  May also be skin infection due to immunocompromise.  Will need I&D to control infection and determine etiology.  IV abx and NPO p MN.       Vanita Panda, MD  Colorectal and General Surgery [facility]  Total time of evaluation, examination, counseling and implementing medical decisions was 55 mins.  moderate decision making.

## 2023-03-12 NOTE — ED Triage Notes (Signed)
Pt coming in today developed a boil on the lower left quadrant of his abd. Pt states it began draining over the weekend. Pt developed a fever today of 100.8, and then decided to come to the ER. Pt has hx of chron's disease, and fistulas.

## 2023-03-12 NOTE — Assessment & Plan Note (Signed)
No new colonic abscess or fistula.  Plan Continue home regimen - injectable immunologic agent every 8 weeks  Dr. Marina Goodell for GI has been notified by EDP

## 2023-03-12 NOTE — Assessment & Plan Note (Addendum)
Patient reports last A1C at Dr. Idelle Crouch office wss 7.5% July '24  Plan Continue [name]  Sliding scale coverage q4 while NPO

## 2023-03-12 NOTE — Progress Notes (Signed)
PHARMACY -  BRIEF ANTIBIOTIC NOTE   Pharmacy has received consult(s) for cefepime from an ED provider.  The patient's profile has been reviewed for ht/wt/allergies/indication/available labs.    One time order(s) placed for cefepime 2 g  Further antibiotics/pharmacy consults should be ordered by admitting physician if indicated.                       Thank you,  Pricilla Riffle, PharmD, BCPS Clinical Pharmacist 03/12/2023 4:27 PM

## 2023-03-12 NOTE — H&P (View-Only) (Signed)
CC: abscess  Requesting provider: Dr Silverio Lay  HPI: Sean Schaefer is an 57 y.o. male with Crohn's who is here for worsening L hip pain and low grade fevers. 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  He has failed anti TNF biologics and is now on [employer].  He follows with Dr Myrtie Neither.  He is scheduled to f/u with [facility] GI in the future as well.  Last surgery here was I&D of perineal abscess in June with Dr Fredricka Bonine.  He now presents with worsening L hip SQ pain and fevers.  He has had some drainage in this area for the last week.   Wbc is 10.6.  CT shows some pelvic inflammation, chronic appearing perineal disease and some LLQ abd wall inflammation with soft tissue gas concerning for injury or infection.  Pt follows with Dr Cliffton Asters as an outpatient.   Past Medical History:  Diagnosis Date   CAD (coronary artery disease)    Crohn's disease (HCC)    Diabetes mellitus (HCC)    Dilation of aorta (HCC)    Hypertension    Ulcerative colitis (HCC)     Past Surgical History:  Procedure Laterality Date   BIOPSY  12/12/2022   Procedure: BIOPSY;  Surgeon: Benancio Deeds, MD;  Location: [facility] ENDOSCOPY;  Service: Gastroenterology;;   COLON SURGERY     COLONOSCOPY     CORONARY BALLOON ANGIOPLASTY N/A 10/20/2022   Procedure: CORONARY BALLOON ANGIOPLASTY;  Surgeon: Marykay Lex, MD;  Location: [facility] INVASIVE CV LAB;  Service: Cardiovascular;  Laterality: N/A;   CORONARY STENT INTERVENTION N/A 10/20/2022   Procedure: CORONARY STENT INTERVENTION;  Surgeon: Marykay Lex, MD;  Location: [facility] INVASIVE CV LAB;  Service: Cardiovascular;  Laterality: N/A;   ESOPHAGOGASTRODUODENOSCOPY (EGD) WITH PROPOFOL N/A 12/12/2022   Procedure: ESOPHAGOGASTRODUODENOSCOPY (EGD) WITH PROPOFOL;  Surgeon: Benancio Deeds, MD;  Location: [facility] ENDOSCOPY;  Service: Gastroenterology;  Laterality: N/A;   EYE SURGERY Left    FLEXIBLE SIGMOIDOSCOPY N/A 02/05/2021   Procedure: FLEXIBLE POUCHOSCOPY WITH BIOPSY;   Surgeon: Andria Meuse, MD;  Location: [facility] ORS;  Service: General;  Laterality: N/A;   INCISION AND DRAINAGE ABSCESS N/A 02/05/2021   Procedure: INCISION AND DRAINAGE OF PERIANAL ABSCESS;  Surgeon: Andria Meuse, MD;  Location: [facility] ORS;  Service: General;  Laterality: N/A;   INCISION AND DRAINAGE ABSCESS N/A 12/21/2022   Procedure: INCISION AND DRAINAGE ABSCESS;  Surgeon: Berna Bue, MD;  Location: [facility] OR;  Service: General;  Laterality: N/A;   INCISION AND DRAINAGE PERIRECTAL ABSCESS N/A 11/30/2020   Procedure: IRRIGATION AND DEBRIDEMENT PERIRECTAL ABSCESS;  Surgeon: Fritzi Mandes, MD;  Location: [facility] OR;  Service: General;  Laterality: N/A;   INCISION AND DRAINAGE PERIRECTAL ABSCESS Left 04/03/2021   Procedure: left peri-rectal/gluteal abscess;  Surgeon: Diamantina Monks, MD;  Location: [facility] OR;  Service: General;  Laterality: Left;   LEFT HEART CATH AND CORONARY ANGIOGRAPHY N/A 10/20/2022   Procedure: LEFT HEART CATH AND CORONARY ANGIOGRAPHY;  Surgeon: Marykay Lex, MD;  Location: [facility] INVASIVE CV LAB;  Service: Cardiovascular;  Laterality: N/A;   Perianal fistula repair  07/22/2010   PLACEMENT OF SETON N/A 02/05/2021   Procedure: PLACEMENT OF SETONS X2;  Surgeon: Andria Meuse, MD;  Location: [facility] ORS;  Service: General;  Laterality: N/A;   RECTAL EXAM UNDER ANESTHESIA N/A 02/05/2021   Procedure: ANORECTAL EXAM UNDER ANESTHESIA;  Surgeon: Andria Meuse, MD;  Location: [facility] ORS;  Service: General;  Laterality: N/A;   TESTICLE REMOVAL Left 2000    Family History  Problem Relation Age of Onset   Hypertension Mother    Cancer Mother    Hypertension Father    Inflammatory bowel disease Brother    Diabetes Paternal Uncle    Diabetes Paternal Grandmother    Diabetes Paternal Grandfather    Heart disease Neg Hx    Colon cancer Neg Hx    Esophageal cancer Neg Hx    Rectal cancer Neg Hx    Stomach cancer Neg Hx     Social:  reports that he has never smoked. He  has never used smokeless tobacco. He reports that he does not drink alcohol and does not use drugs.  Allergies:  Allergies  Allergen Reactions   Statins Other (See Comments)    Myalgias  Currently taking Crestor 10mg .    Medications: I have reviewed the patient's current medications.  Results for orders placed or performed during the hospital encounter of 03/12/23 (from the past 48 hour(s))  Comprehensive metabolic panel     Status: Abnormal   Collection Time: 03/12/23  4:24 PM  Result Value Ref Range   Sodium 130 (L) 135 - 145 mmol/L   Potassium 4.2 3.5 - 5.1 mmol/L   Chloride 94 (L) 98 - 111 mmol/L   CO2 24 22 - 32 mmol/L   Glucose, Bld 296 (H) 70 - 99 mg/dL    Comment: Glucose reference range applies only to samples taken after fasting for at least 8 hours.   BUN 18 6 - 20 mg/dL   Creatinine, Ser 4.09 (H) 0.61 - 1.24 mg/dL   Calcium 9.4 8.9 - 81.1 mg/dL   Total Protein 9.1 (H) 6.5 - 8.1 g/dL   Albumin 3.4 (L) 3.5 - 5.0 g/dL   AST 14 (L) 15 - 41 U/L   ALT 13 0 - 44 U/L   Alkaline Phosphatase 69 38 - 126 U/L   Total Bilirubin 1.4 (H) 0.3 - 1.2 mg/dL   GFR, Estimated 52 (L) >60 mL/min    Comment: (NOTE) Calculated using the CKD-EPI Creatinine Equation (2021)    Anion gap 12 5 - 15    Comment: Performed at [facility], [address] [place], [place] [address]  CBC with Differential     Status: Abnormal   Collection Time: 03/12/23  4:24 PM  Result Value Ref Range   WBC 10.6 (H) 4.0 - 10.5 K/uL   RBC 4.49 4.22 - 5.81 MIL/uL   Hemoglobin 13.4 13.0 - 17.0 g/dL   HCT 29.5 62.1 - 30.8 %   MCV 90.2 80.0 - 100.0 fL   MCH 29.8 26.0 - 34.0 pg   MCHC 33.1 30.0 - 36.0 g/dL   RDW 65.7 84.6 - 96.2 %   Platelets 170 150 - 400 K/uL   nRBC 0.0 0.0 - 0.2 %   Neutrophils Relative % 78 %   Neutro Abs 8.2 (H) 1.7 - 7.7 K/uL   Lymphocytes Relative 10 %   Lymphs Abs 1.1 0.7 - 4.0 K/uL   Monocytes Relative 11 %   Monocytes Absolute 1.1 (H) 0.1 - 1.0 K/uL    Eosinophils Relative 1 %   Eosinophils Absolute 0.1 0.0 - 0.5 K/uL   Basophils Relative 0 %   Basophils Absolute 0.0 0.0 - 0.1 K/uL   Immature Granulocytes 0 %   Abs Immature Granulocytes 0.04 0.00 - 0.07 K/uL    Comment: Performed at [facility], [address], [place], [place] [address]  I-Stat Lactic Acid, ED     Status: None   Collection Time: 03/12/23  4:45 PM  Result Value Ref Range   Lactic Acid, Venous 1.3 0.5 - 1.9 mmol/L    CT ABDOMEN PELVIS W CONTRAST  Result Date: 03/12/2023 CLINICAL DATA:  Crohn's disease with worsening left lower quadrant abdominal pain and drainage. Fever today. Known history of fistulas EXAM: CT ABDOMEN AND PELVIS WITH CONTRAST TECHNIQUE: Multidetector CT imaging of the abdomen and pelvis was performed using the standard protocol following bolus administration of intravenous contrast. RADIATION DOSE REDUCTION: This exam was performed according to the departmental dose-optimization program which includes automated exposure control, adjustment of the mA and/or kV according to patient size and/or use of iterative reconstruction technique. CONTRAST:  OMNIPAQUE IOHEXOL 300 MG/ML  SOLN COMPARISON:  CT 12/20/2022 and older FINDINGS: Lower chest: There is some linear opacity lung bases likely scar or atelectasis. No pleural effusion. Hepatobiliary: No focal liver abnormality is seen. No gallbladder wall thickening, or biliary dilatation. Gallstones. Patent portal vein. Pancreas: Unremarkable. No pancreatic ductal dilatation or surrounding inflammatory changes. Spleen: Normal in size without focal abnormality. Adrenals/Urinary Tract: Adrenal glands are preserved. No enhancing renal mass or collecting system dilatation. The ureters have normal course and caliber extending down to the bladder preserved contours of the urinary bladder Stomach/Bowel: No oral contrast. The stomach is underdistended. Normal course and caliber of the duodenum. Surgical  changes along the bowel with previous colectomy. Anastomosis at the rectosigmoid region with surgical changes. Once again there is some thickening in the presacral space with a small area of air and fluid posteriorly to the course of the bowel in this location. Please correlate for surgical change or contained leak. Again unchanged. Multiple setons are identified along the anorectal region with soft tissue thickening extending into the left ischioanal fossa and along the left gluteal cleft. The previous fluid collections are no longer identified, more residual soft tissue thickening. Vascular/Lymphatic: Normal caliber aorta and IVC with scattered vascular calcifications. There are some prominent pelvic nodes identified which could be reactive, pelvic sidewall and inguinal regions, left-greater-than-right. Reproductive: Mildly enlarged prostate. Other: No free intra-air. There is inflammatory stranding with soft tissue gas along the subcutaneous fat anterior to the left hip and along the left hemipelvis anterior laterally. This new from the prior CT scan. There is skin thickening and stranding in this location. There is no obvious tract extending into the peritoneal cavity in this location. Please correlate for soft tissue infection or other process. Musculoskeletal: Degenerative changes seen of the spine and pelvis. IMPRESSION: Surgical changes of previous subtotal colectomy with soft tissue thickening in the area of the surgical anastomosis in the low pelvis. There continues to be soft tissue thickening and some air with fluid extending into the presacral space, similar to previous. Multiple setons in place along the anal region consistent with multiple fistula. The fluid collection along the ischioanal fossa and fat deep to the gluteal cleft on the left is improving with significant residual soft tissue thickening and stranding. No soft tissue gas in this location. However there is a new area of stranding, skin  thickening with soft tissue gas along the subcutaneous fat anterolateral along the left hemipelvis and anterior to the left hip. Please correlate for etiology including soft tissue injury or infection. No clear fistula track extends into the peritoneal space from this location. Please correlate with clinical history. Gallstones. Electronically Signed   By: Piedad Climes.D.  On: 03/12/2023 18:11    ROS - all of the below systems have been reviewed with the patient and positives are indicated with bold text General: chills, fever or night sweats Eyes: blurry vision or double vision ENT: epistaxis or sore throat Hematologic/Lymphatic: bleeding problems, blood clots or swollen lymph nodes Endocrine: temperature intolerance or unexpected weight changes Breast: new or changing breast lumps or nipple discharge Resp: cough, shortness of breath, or wheezing CV: chest pain or dyspnea on exertion GI: as per HPI GU: dysuria, trouble voiding, or hematuria Neuro: TIA or stroke symptoms    PE Blood pressure 132/77, pulse (!) 110, temperature (!) 100.8 F (38.2 C), temperature source Oral, resp. rate 18, height 5\' 10"  (1.778 m), weight 93.9 kg, SpO2 95%. Constitutional: NAD; conversant; no deformities Eyes: Moist conjunctiva; no lid lag; anicteric; PERRL Neck: Trachea midline; no thyromegaly Lungs: Normal respiratory effort CV: RRR GI: Abd cellulitis and purulent discharge with fluctuance in the LLQ MSK: Normal range of motion of extremities; no clubbing/cyanosis Psychiatric: Appropriate affect; alert and oriented x3  Results for orders placed or performed during the hospital encounter of 03/12/23 (from the past 48 hour(s))  Comprehensive metabolic panel     Status: Abnormal   Collection Time: 03/12/23  4:24 PM  Result Value Ref Range   Sodium 130 (L) 135 - 145 mmol/L   Potassium 4.2 3.5 - 5.1 mmol/L   Chloride 94 (L) 98 - 111 mmol/L   CO2 24 22 - 32 mmol/L   Glucose, Bld 296 (H) 70 - 99 mg/dL     Comment: Glucose reference range applies only to samples taken after fasting for at least 8 hours.   BUN 18 6 - 20 mg/dL   Creatinine, Ser 4.62 (H) 0.61 - 1.24 mg/dL   Calcium 9.4 8.9 - 70.3 mg/dL   Total Protein 9.1 (H) 6.5 - 8.1 g/dL   Albumin 3.4 (L) 3.5 - 5.0 g/dL   AST 14 (L) 15 - 41 U/L   ALT 13 0 - 44 U/L   Alkaline Phosphatase 69 38 - 126 U/L   Total Bilirubin 1.4 (H) 0.3 - 1.2 mg/dL   GFR, Estimated 52 (L) >60 mL/min    Comment: (NOTE) Calculated using the CKD-EPI Creatinine Equation (2021)    Anion gap 12 5 - 15    Comment: Performed at [facility], [address], [place], [place] [address]  CBC with Differential     Status: Abnormal   Collection Time: 03/12/23  4:24 PM  Result Value Ref Range   WBC 10.6 (H) 4.0 - 10.5 K/uL   RBC 4.49 4.22 - 5.81 MIL/uL   Hemoglobin 13.4 13.0 - 17.0 g/dL   HCT 81.8 29.9 - 37.1 %   MCV 90.2 80.0 - 100.0 fL   MCH 29.8 26.0 - 34.0 pg   MCHC 33.1 30.0 - 36.0 g/dL   RDW 69.6 78.9 - 38.1 %   Platelets 170 150 - 400 K/uL   nRBC 0.0 0.0 - 0.2 %   Neutrophils Relative % 78 %   Neutro Abs 8.2 (H) 1.7 - 7.7 K/uL   Lymphocytes Relative 10 %   Lymphs Abs 1.1 0.7 - 4.0 K/uL   Monocytes Relative 11 %   Monocytes Absolute 1.1 (H) 0.1 - 1.0 K/uL   Eosinophils Relative 1 %   Eosinophils Absolute 0.1 0.0 - 0.5 K/uL   Basophils Relative 0 %   Basophils Absolute 0.0 0.0 - 0.1 K/uL   Immature Granulocytes 0 %  Abs Immature Granulocytes 0.04 0.00 - 0.07 K/uL    Comment: Performed at [facility], [address], [place], [place] [address]  I-Stat Lactic Acid, ED     Status: None   Collection Time: 03/12/23  4:45 PM  Result Value Ref Range   Lactic Acid, Venous 1.3 0.5 - 1.9 mmol/L    CT ABDOMEN PELVIS W CONTRAST  Result Date: 03/12/2023 CLINICAL DATA:  Crohn's disease with worsening left lower quadrant abdominal pain and drainage. Fever today. Known history of fistulas EXAM: CT ABDOMEN AND PELVIS WITH  CONTRAST TECHNIQUE: Multidetector CT imaging of the abdomen and pelvis was performed using the standard protocol following bolus administration of intravenous contrast. RADIATION DOSE REDUCTION: This exam was performed according to the departmental dose-optimization program which includes automated exposure control, adjustment of the mA and/or kV according to patient size and/or use of iterative reconstruction technique. CONTRAST:  OMNIPAQUE IOHEXOL 300 MG/ML  SOLN COMPARISON:  CT 12/20/2022 and older FINDINGS: Lower chest: There is some linear opacity lung bases likely scar or atelectasis. No pleural effusion. Hepatobiliary: No focal liver abnormality is seen. No gallbladder wall thickening, or biliary dilatation. Gallstones. Patent portal vein. Pancreas: Unremarkable. No pancreatic ductal dilatation or surrounding inflammatory changes. Spleen: Normal in size without focal abnormality. Adrenals/Urinary Tract: Adrenal glands are preserved. No enhancing renal mass or collecting system dilatation. The ureters have normal course and caliber extending down to the bladder preserved contours of the urinary bladder Stomach/Bowel: No oral contrast. The stomach is underdistended. Normal course and caliber of the duodenum. Surgical changes along the bowel with previous colectomy. Anastomosis at the rectosigmoid region with surgical changes. Once again there is some thickening in the presacral space with a small area of air and fluid posteriorly to the course of the bowel in this location. Please correlate for surgical change or contained leak. Again unchanged. Multiple setons are identified along the anorectal region with soft tissue thickening extending into the left ischioanal fossa and along the left gluteal cleft. The previous fluid collections are no longer identified, more residual soft tissue thickening. Vascular/Lymphatic: Normal caliber aorta and IVC with scattered vascular calcifications. There are some  prominent pelvic nodes identified which could be reactive, pelvic sidewall and inguinal regions, left-greater-than-right. Reproductive: Mildly enlarged prostate. Other: No free intra-air. There is inflammatory stranding with soft tissue gas along the subcutaneous fat anterior to the left hip and along the left hemipelvis anterior laterally. This new from the prior CT scan. There is skin thickening and stranding in this location. There is no obvious tract extending into the peritoneal cavity in this location. Please correlate for soft tissue infection or other process. Musculoskeletal: Degenerative changes seen of the spine and pelvis. IMPRESSION: Surgical changes of previous subtotal colectomy with soft tissue thickening in the area of the surgical anastomosis in the low pelvis. There continues to be soft tissue thickening and some air with fluid extending into the presacral space, similar to previous. Multiple setons in place along the anal region consistent with multiple fistula. The fluid collection along the ischioanal fossa and fat deep to the gluteal cleft on the left is improving with significant residual soft tissue thickening and stranding. No soft tissue gas in this location. However there is a new area of stranding, skin thickening with soft tissue gas along the subcutaneous fat anterolateral along the left hemipelvis and anterior to the left hip. Please correlate for etiology including soft tissue injury or infection. No clear fistula track extends into the  peritoneal space from this location. Please correlate with clinical history. Gallstones. Electronically Signed   By: Karen Kays M.D.   On: 03/12/2023 18:11     A/P: Sean Schaefer is an 57 y.o. male with L hip SQ abscess.  No clear fistulous communication on CT but pt does have active pelvic inflammation.  May also be skin infection due to immunocompromise.  Will need I&D to control infection and determine etiology.  IV abx and NPO p MN.       Vanita Panda, MD  Colorectal and General Surgery [facility]  Total time of evaluation, examination, counseling and implementing medical decisions was 55 mins.  moderate decision making.

## 2023-03-12 NOTE — ED Provider Notes (Signed)
EMERGENCY DEPARTMENT AT [facility] Provider Note   CSN: [id number] Arrival date & time: 03/12/23  1538     History  Chief Complaint  Patient presents with  . Abdominal Pain    Sean Schaefer is a 57 y.o. male hx of Crohn's disease with multiple surgeries and fistulas on Skyrizi, here with abdominal pain and fever.  Patient states that he noticed drainage from the left lower quadrant of his abdomen for the last week or so.  Patient states that he started running a fever today of 100.8.  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  Patient is not currently on antibiotics.  Patient states that he gets frequent Skyrizi injections.  Patient follows up with Dr. Caren Macadam from GI.  The history is provided by the patient.       Home Medications Prior to Admission medications   Medication Sig Start Date End Date Taking? Authorizing Provider  aspirin EC 81 MG tablet Take 1 tablet (81 mg total) by mouth daily. Swallow whole. 10/23/22   Zannie Cove, MD  carvedilol (COREG) 12.5 MG tablet Take 6.25 mg ( 1/2 tablet of 12.5 mg)  in the morning and 12.5 mg  in the evening 12/06/22   Marykay Lex, MD  Continuous Glucose Sensor (FREESTYLE LIBRE 14 DAY SENSOR) MISC Apply 1 sensor to the back of upper arm every 14 days 10/23/22     insulin degludec (TRESIBA FLEXTOUCH) 100 UNIT/ML FlexTouch Pen Inject 38 Units into the skin daily. 12/25/22   Hongalgi, Maximino Greenland, MD  insulin lispro (HUMALOG) 100 UNIT/ML injection Inject 8 Units into the skin 3 (three) times daily with meals.    [provider]  nitroGLYCERIN (NITROSTAT) 0.4 MG SL tablet Place 1 tablet (0.4 mg total) under the tongue every 5 (five) minutes as needed for chest pain. 11/04/22 02/11/23  Joylene Grapes, NP  ondansetron (ZOFRAN-ODT) 4 MG disintegrating tablet Dissolve 1 tablet (4 mg total) by mouth every 8 (eight) hours as needed for nausea or vomiting. 12/13/22   Dorcas Carrow, MD  pantoprazole (PROTONIX) 40 MG tablet Take 1 tablet (40 mg total) by mouth 2 (two) times daily. 12/13/22 12/13/23  Dorcas Carrow, MD  Risankizumab-rzaa (SKYRIZI) 180 MG/1.2ML SOCT Inject 1.2 mLs into the skin every 8 (eight) weeks. 04/14/23   Quentin Angst, MD  rosuvastatin (CRESTOR) 40 MG tablet Take 1 tablet (40 mg total) by mouth daily. 02/24/23   Marykay Lex, MD  ticagrelor (BRILINTA) 90 MG TABS tablet Take 1 tablet (90 mg) by mouth 2 times daily. 11/13/22 11/13/23  Joylene Grapes, NP      Allergies    Statins    Review of Systems   Review of Systems  Constitutional:  Positive for fever.  Gastrointestinal:  Positive for abdominal pain.  Skin:  Positive for wound.  All other systems reviewed and are negative.   Physical Exam Updated Vital Signs BP 104/73 (BP Location: Right Arm)   Pulse (!) 118   Temp (!) 100.8 F (38.2 C) (Oral)   Resp 18   Ht 5\' 10"  (1.778 m)   Wt 93.9 kg   SpO2 100%   BMI 29.70 kg/m  Physical Exam Vitals reviewed.  Constitutional:      Appearance: He is well-developed.  HENT:     Mouth/Throat:     Pharynx: Oropharynx is clear.  Eyes:     Extraocular Movements: Extraocular movements intact.  Cardiovascular:  Rate and Rhythm: Regular rhythm. Tachycardia present.     Heart sounds: Normal heart sounds.  Pulmonary:     Effort: Pulmonary effort is normal.  Abdominal:     Comments: Patient has obvious cellulitis in the left lower quadrant with some purulent discharge.  There is some fluctuance in that area as well.  Skin:    General: Skin is warm.     Capillary Refill: Capillary refill takes less than 2 seconds.  Neurological:     General: No focal deficit present.     Mental Status: He is alert and oriented to person, place, and time.  Psychiatric:        Mood and Affect: Mood normal.        Behavior: Behavior normal.    ED Results / Procedures / Treatments   Labs (all labs ordered are listed, but only abnormal results are  displayed) Labs Reviewed  CBC WITH DIFFERENTIAL/PLATELET - Abnormal; Notable for the following components:      Result Value   WBC 10.6 (*)    Neutro Abs 8.2 (*)    Monocytes Absolute 1.1 (*)    All other components within normal limits  CULTURE, BLOOD (ROUTINE X 2)  CULTURE, BLOOD (ROUTINE X 2)  COMPREHENSIVE METABOLIC PANEL  URINALYSIS, W/ REFLEX TO CULTURE (INFECTION SUSPECTED)  I-STAT CG4 LACTIC ACID, ED    EKG None  Radiology No results found.  Procedures Procedures    CRITICAL CARE Performed by: Richardean Canal   Total critical care time: 34 minutes  Critical care time was exclusive of separately billable procedures and treating other patients.  Critical care was necessary to treat or prevent imminent or life-threatening deterioration.  Critical care was time spent personally by me on the following activities: development of treatment plan with patient and/or surrogate as well as nursing, discussions with consultants, evaluation of patient's response to treatment, examination of patient, obtaining history from patient or surrogate, ordering and performing treatments and interventions, ordering and review of laboratory studies, ordering and review of radiographic studies, pulse oximetry and re-evaluation of patient's condition.   Medications Ordered in ED Medications  metroNIDAZOLE (FLAGYL) IVPB 500 mg (has no administration in time range)  ceFEPIme (MAXIPIME) 2 g in sodium chloride 0.9 % 100 mL IVPB (2 g Intravenous New Bag/Given 03/12/23 1648)  acetaminophen (TYLENOL) tablet 650 mg (650 mg Oral Given 03/12/23 1630)  sodium chloride 0.9 % bolus 1,000 mL (1,000 mLs Intravenous New Bag/Given 03/12/23 1644)  HYDROmorphone (DILAUDID) injection 1 mg (1 mg Intravenous Given 03/12/23 1646)  ondansetron (ZOFRAN) injection 4 mg (4 mg Intravenous Given 03/12/23 1645)    ED Course/ Medical Decision Making/ A&P                                 Medical Decision Making Sean Schaefer is a 57 y.o. male here presenting with abdominal pain and drainage from left lower quadrant.  Patient has a history of multiple fistulas.  I am concerned that he developed another fistula from his Crohn's disease.  Given fever and tachycardia, I initiated a sepsis workup with CBC CMP and lactate and cultures and CT abdomen pelvis.  Will give cefepime and Flagyl to Cover intra-abdominal infection.  6:45 PM Patient's white blood cell count is 10.  I reviewed patient's CT scan and it shows a large area of subcutaneous gas around the left hip area.  It does not involve the  hip joint and there is no obvious fistula.  However the area is quite large.  Patient was given cefepime and Flagyl.  Consulted surgery to perform I&D in the operating room.  I discussed case with Dr. Maisie Fus from general surgery.  She states that she will see the patient as a consult.  Either general surgery or IR may do a procedure tomorrow.  She also recommend GI consult as patient likely has a fistula.  Patient follows up with Dr. Myrtie Neither from GI. I messaged Dr. Marina Goodell who is on call for the group to see the patient. Hospitalist to admit.   Problems Addressed: Cutaneous abscess of abdominal wall: acute illness or injury Sepsis, due to unspecified organism, unspecified whether acute organ dysfunction present [facility]): acute illness or injury  Amount and/or Complexity of Data Reviewed Labs: ordered. Decision-making details documented in ED Course. Radiology: ordered and independent interpretation performed. Decision-making details documented in ED Course.  Risk OTC drugs. Prescription drug management. Decision regarding hospitalization.    Final Clinical Impression(s) / ED Diagnoses Final diagnoses:  None    Rx / DC Orders ED Discharge Orders     None         Charlynne Pander, MD 03/12/23 [phone]

## 2023-03-13 ENCOUNTER — Inpatient Hospital Stay (HOSPITAL_COMMUNITY): Payer: 59 | Admitting: Anesthesiology

## 2023-03-13 ENCOUNTER — Encounter (HOSPITAL_COMMUNITY): Payer: Self-pay | Admitting: Internal Medicine

## 2023-03-13 ENCOUNTER — Encounter (HOSPITAL_COMMUNITY)
Admission: EM | Disposition: A | Discharge status: Home / Self Care | Payer: Self-pay | Source: Home / Self Care | Attending: Internal Medicine

## 2023-03-13 ENCOUNTER — Other Ambulatory Visit: Payer: Self-pay

## 2023-03-13 DIAGNOSIS — Z794 Long term (current) use of insulin: Secondary | ICD-10-CM | POA: Diagnosis not present

## 2023-03-13 DIAGNOSIS — E119 Type 2 diabetes mellitus without complications: Secondary | ICD-10-CM

## 2023-03-13 DIAGNOSIS — K50119 Crohn's disease of large intestine with unspecified complications: Secondary | ICD-10-CM

## 2023-03-13 DIAGNOSIS — E785 Hyperlipidemia, unspecified: Secondary | ICD-10-CM | POA: Diagnosis not present

## 2023-03-13 DIAGNOSIS — L0291 Cutaneous abscess, unspecified: Secondary | ICD-10-CM | POA: Diagnosis not present

## 2023-03-13 DIAGNOSIS — L02416 Cutaneous abscess of left lower limb: Secondary | ICD-10-CM

## 2023-03-13 DIAGNOSIS — I25110 Atherosclerotic heart disease of native coronary artery with unstable angina pectoris: Secondary | ICD-10-CM | POA: Diagnosis not present

## 2023-03-13 HISTORY — PX: INCISION AND DRAINAGE ABSCESS: SHX5864

## 2023-03-13 LAB — SURGICAL PCR SCREEN
MRSA, PCR: NEGATIVE
Staphylococcus aureus: NEGATIVE

## 2023-03-13 LAB — GLUCOSE, CAPILLARY
Glucose-Capillary: 103 mg/dL — ABNORMAL HIGH (ref 70–99)
Glucose-Capillary: 129 mg/dL — ABNORMAL HIGH (ref 70–99)
Glucose-Capillary: 164 mg/dL — ABNORMAL HIGH (ref 70–99)
Glucose-Capillary: 192 mg/dL — ABNORMAL HIGH (ref 70–99)
Glucose-Capillary: 208 mg/dL — ABNORMAL HIGH (ref 70–99)
Glucose-Capillary: 232 mg/dL — ABNORMAL HIGH (ref 70–99)
Glucose-Capillary: 85 mg/dL (ref 70–99)

## 2023-03-13 SURGERY — INCISION AND DRAINAGE, ABSCESS
Anesthesia: General | Laterality: Left

## 2023-03-13 MED ORDER — DEXAMETHASONE SODIUM PHOSPHATE 4 MG/ML IJ SOLN
INTRAMUSCULAR | Status: DC | PRN
  Administered 2023-03-13: 4 mg via INTRAVENOUS

## 2023-03-13 MED ORDER — LIDOCAINE HCL (CARDIAC) PF 100 MG/5ML IV SOSY
PREFILLED_SYRINGE | INTRAVENOUS | Status: DC | PRN
  Administered 2023-03-13: 20 mg via INTRAVENOUS

## 2023-03-13 MED ORDER — LACTATED RINGERS IV SOLN: INTRAVENOUS | Status: DC

## 2023-03-13 MED ORDER — PROPOFOL 10 MG/ML IV BOLUS
INTRAVENOUS | Status: DC | PRN
  Administered 2023-03-13: 80 mg via INTRAVENOUS

## 2023-03-13 MED ORDER — FENTANYL CITRATE (PF) 100 MCG/2ML IJ SOLN
INTRAMUSCULAR | Status: DC | PRN
  Administered 2023-03-13: 100 ug via INTRAVENOUS

## 2023-03-13 MED ORDER — OXYCODONE HCL 5 MG PO TABS
ORAL_TABLET | ORAL | Status: AC
  Filled 2023-03-13: qty 1

## 2023-03-13 MED ORDER — FENTANYL CITRATE (PF) 100 MCG/2ML IJ SOLN
INTRAMUSCULAR | Status: AC
  Filled 2023-03-13: qty 2

## 2023-03-13 MED ORDER — MIDAZOLAM HCL 2 MG/2ML IJ SOLN
INTRAMUSCULAR | Status: AC
  Filled 2023-03-13: qty 2

## 2023-03-13 MED ORDER — MEPERIDINE HCL 50 MG/ML IJ SOLN: 6.2500 mg | INTRAMUSCULAR | Status: DC | PRN

## 2023-03-13 MED ORDER — ONDANSETRON HCL 4 MG/2ML IJ SOLN
INTRAMUSCULAR | Status: AC
  Filled 2023-03-13: qty 2

## 2023-03-13 MED ORDER — ROCURONIUM BROMIDE 10 MG/ML (PF) SYRINGE
PREFILLED_SYRINGE | INTRAVENOUS | Status: DC | PRN
  Administered 2023-03-13: 60 mg via INTRAVENOUS

## 2023-03-13 MED ORDER — OXYCODONE HCL 5 MG PO TABS
5.0000 mg | ORAL_TABLET | Freq: Once | ORAL | Status: AC | PRN
  Administered 2023-03-13: 5 mg via ORAL

## 2023-03-13 MED ORDER — ONDANSETRON HCL 4 MG/2ML IJ SOLN
INTRAMUSCULAR | Status: DC | PRN
Start: 2023-03-13 — End: 2023-03-13
  Administered 2023-03-13: 4 mg via INTRAVENOUS

## 2023-03-13 MED ORDER — PHENYLEPHRINE 80 MCG/ML (10ML) SYRINGE FOR IV PUSH (FOR BLOOD PRESSURE SUPPORT)
PREFILLED_SYRINGE | INTRAVENOUS | Status: AC
  Filled 2023-03-13: qty 10

## 2023-03-13 MED ORDER — PROMETHAZINE HCL 25 MG/ML IJ SOLN: 6.2500 mg | INTRAMUSCULAR | Status: DC | PRN

## 2023-03-13 MED ORDER — LACTATED RINGERS IV SOLN
INTRAVENOUS | Status: DC | PRN
Start: 2023-03-13 — End: 2023-03-13

## 2023-03-13 MED ORDER — HYDROMORPHONE HCL 1 MG/ML IJ SOLN
1.0000 mg | INTRAMUSCULAR | Status: DC | PRN
  Administered 2023-03-13 – 2023-03-14 (×3): 1 mg via INTRAVENOUS
  Filled 2023-03-13 (×3): qty 1

## 2023-03-13 MED ORDER — MIDAZOLAM HCL 2 MG/2ML IJ SOLN: 0.5000 mg | Freq: Once | INTRAMUSCULAR | Status: DC | PRN

## 2023-03-13 MED ORDER — HYDROMORPHONE HCL 1 MG/ML IJ SOLN
INTRAMUSCULAR | Status: AC
  Filled 2023-03-13: qty 1

## 2023-03-13 MED ORDER — TRAZODONE HCL 50 MG PO TABS
50.0000 mg | ORAL_TABLET | Freq: Every evening | ORAL | Status: DC | PRN
  Administered 2023-03-13: 50 mg via ORAL

## 2023-03-13 MED ORDER — DEXAMETHASONE SODIUM PHOSPHATE 10 MG/ML IJ SOLN
INTRAMUSCULAR | Status: AC
  Filled 2023-03-13: qty 1

## 2023-03-13 MED ORDER — MIDAZOLAM HCL 5 MG/5ML IJ SOLN
INTRAMUSCULAR | Status: DC | PRN
  Administered 2023-03-13: 2 mg via INTRAVENOUS

## 2023-03-13 MED ORDER — BUPIVACAINE-EPINEPHRINE 0.25% -1:200000 IJ SOLN
INTRAMUSCULAR | Status: DC | PRN
  Administered 2023-03-13: 10 mL

## 2023-03-13 MED ORDER — HYDROMORPHONE HCL 1 MG/ML IJ SOLN
0.2500 mg | INTRAMUSCULAR | Status: DC | PRN
  Administered 2023-03-13 (×3): 0.5 mg via INTRAVENOUS

## 2023-03-13 MED ORDER — OXYCODONE HCL 5 MG PO TABS
5.0000 mg | ORAL_TABLET | ORAL | Status: DC | PRN
  Administered 2023-03-13: 5 mg via ORAL
  Filled 2023-03-13: qty 1

## 2023-03-13 MED ORDER — BUPIVACAINE-EPINEPHRINE 0.25% -1:200000 IJ SOLN
INTRAMUSCULAR | Status: AC
  Filled 2023-03-13: qty 1

## 2023-03-13 MED ORDER — ROCURONIUM BROMIDE 10 MG/ML (PF) SYRINGE
PREFILLED_SYRINGE | INTRAVENOUS | Status: AC
  Filled 2023-03-13: qty 10

## 2023-03-13 MED ORDER — OXYCODONE HCL 5 MG/5ML PO SOLN: 5.0000 mg | Freq: Once | ORAL | Status: AC | PRN

## 2023-03-13 MED ORDER — SUGAMMADEX SODIUM 200 MG/2ML IV SOLN
INTRAVENOUS | Status: DC | PRN
  Administered 2023-03-13: 400 mg via INTRAVENOUS

## 2023-03-13 MED ORDER — HEPARIN SODIUM (PORCINE) 5000 UNIT/ML IJ SOLN
5000.0000 [IU] | Freq: Three times a day (TID) | INTRAMUSCULAR | Status: DC
  Administered 2023-03-14 (×2): 5000 [IU] via SUBCUTANEOUS
  Filled 2023-03-13 (×2): qty 1

## 2023-03-13 MED ORDER — PROPOFOL 10 MG/ML IV BOLUS
INTRAVENOUS | Status: AC
  Filled 2023-03-13: qty 20

## 2023-03-13 MED ORDER — ACETAMINOPHEN 500 MG PO TABS
1000.0000 mg | ORAL_TABLET | Freq: Four times a day (QID) | ORAL | Status: DC
  Administered 2023-03-13 – 2023-03-14 (×5): 1000 mg via ORAL
  Filled 2023-03-13 (×6): qty 2

## 2023-03-13 MED ORDER — PHENYLEPHRINE HCL (PRESSORS) 10 MG/ML IV SOLN
INTRAVENOUS | Status: DC | PRN
Start: 2023-03-13 — End: 2023-03-13
  Administered 2023-03-13 (×2): 160 ug via INTRAVENOUS
  Administered 2023-03-13 (×2): 80 ug via INTRAVENOUS
  Administered 2023-03-13: 100 ug via INTRAVENOUS
  Administered 2023-03-13: 80 ug via INTRAVENOUS

## 2023-03-13 MED ORDER — LIDOCAINE HCL (PF) 2 % IJ SOLN
INTRAMUSCULAR | Status: AC
  Filled 2023-03-13: qty 5

## 2023-03-13 MED ORDER — 0.9 % SODIUM CHLORIDE (POUR BTL) OPTIME
TOPICAL | Status: DC | PRN
  Administered 2023-03-13: 1000 mL

## 2023-03-13 SURGICAL SUPPLY — 31 items
BAG COUNTER SPONGE SURGICOUNT (BAG) IMPLANT
BAG SPNG CNTER NS LX DISP (BAG)
BLADE SURG 15 STRL LF DISP TIS (BLADE) ×1 IMPLANT
BLADE SURG 15 STRL SS (BLADE) ×1
BNDG GAUZE DERMACEA FLUFF 4 (GAUZE/BANDAGES/DRESSINGS) IMPLANT
BNDG GZE DERMACEA 4 6PLY (GAUZE/BANDAGES/DRESSINGS)
DRAPE LAPAROSCOPIC ABDOMINAL (DRAPES) IMPLANT
ELECT REM PT RETURN 15FT ADLT (MISCELLANEOUS) ×1 IMPLANT
GAUZE PAD ABD 7.5X8 STRL (GAUZE/BANDAGES/DRESSINGS) IMPLANT
GAUZE PAD ABD 8X10 STRL (GAUZE/BANDAGES/DRESSINGS) IMPLANT
GAUZE SPONGE 4X4 12PLY STRL (GAUZE/BANDAGES/DRESSINGS) IMPLANT
GLOVE BIO SURGEON STRL SZ7.5 (GLOVE) ×1 IMPLANT
GOWN STRL REUS W/ TWL LRG LVL3 (GOWN DISPOSABLE) ×2 IMPLANT
GOWN STRL REUS W/TWL LRG LVL3 (GOWN DISPOSABLE) ×2
KIT BASIN OR (CUSTOM PROCEDURE TRAY) ×1 IMPLANT
KIT TURNOVER KIT A (KITS) IMPLANT
NDL HYPO 25X1 1.5 SAFETY (NEEDLE) IMPLANT
NEEDLE HYPO 25X1 1.5 SAFETY (NEEDLE) IMPLANT
NS IRRIG 1000ML POUR BTL (IV SOLUTION) ×1 IMPLANT
PENCIL SMOKE EVACUATOR (MISCELLANEOUS) IMPLANT
SPIKE FLUID TRANSFER (MISCELLANEOUS) IMPLANT
SUT MNCRL AB 4-0 PS2 18 (SUTURE) IMPLANT
SUT VIC AB 3-0 SH 27 (SUTURE)
SUT VIC AB 3-0 SH 27XBRD (SUTURE) IMPLANT
SWAB COLLECTION DEVICE MRSA (MISCELLANEOUS) IMPLANT
SWAB CULTURE ESWAB REG 1ML (MISCELLANEOUS) IMPLANT
SYR BULB EAR ULCER 3OZ GRN STR (SYRINGE) ×1 IMPLANT
SYR CONTROL 10ML LL (SYRINGE) IMPLANT
TAPE PAPER 3X10 WHT MICROPORE (GAUZE/BANDAGES/DRESSINGS) IMPLANT
TOWEL OR 17X26 10 PK STRL BLUE (TOWEL DISPOSABLE) ×1 IMPLANT
YANKAUER SUCT BULB TIP NO VENT (SUCTIONS) ×1 IMPLANT

## 2023-03-13 NOTE — Op Note (Signed)
Pre-op diagnosis: Left hip subcutaneous abscess Postop diagnosis: Same Procedure performed: Incision and drainage with debridement of a left hip subcutaneous abscess (8 x 3 cm)  Surgeon:Kojo Liby K Courteney Alderete Anesthesia: General Indications: This is a 57 year old male with Crohn's disease has been treated with multiple different biologic agents.  He presented with pain and swelling in the left hip for the last several days.  CT scan revealed subcutaneous abscess.  She presents now with drainage.  Description of procedure: The patient is brought to the operating room and placed in the supine position on the operating room table.  After adequate level general anesthesia was obtained, the patient's lower abdomen and upper thigh were prepped with ChloraPrep and draped sterile fashion.  There is no obvious skin opening.  However there is a longitudinal area of significant fluctuance and erythema.  A timeout was taken to ensure the proper patient proper procedure.  I made a 1 cm round incision in the center of the area of fluctuance.  We immediately encountered some foul-smelling purulent fluid.  We evacuated the abscess.  I probed the abscess cavity which seems to track laterally for several centimeters.  I opened the skin incision over the abscess cavity.  We excised some skin and subcutaneous tissue.  We identified another loculated abscess further lateral.  We opened this widely and evacuated.  I then debrided some skin and subcutaneous tissue that appeared to be necrotic.  Our debridement carried down to the underlying muscle but did not involve the muscle.  We irrigated the wound thoroughly and inspected for hemostasis.  We then packed the wound with saline moistened 4 x 4 gauze.  We used a total of 3 sponges.  A dry dressing is applied.  The patient was then extubated and brought to the cover room in stable condition.  All sponge, instrument, and needle counts are correct.  Wilmon Arms. Corliss Skains, MD, [facility]  General Surgery   03/13/2023 12:15 PM

## 2023-03-13 NOTE — Progress Notes (Signed)
PROGRESS NOTE    Sean Schaefer  WJX:[id number] DOB: 07/02/1966 DOA: 03/12/2023 PCP: Renford Dills, MD    Brief Narrative:  56 year old with IDDM, coronary artery disease status post MI and currently on Brilinta, hyperlipidemia, Crohn's colitis with multiple previous surgeries and rectal abscesses presented with left lower quadrant abscess for about a week.  Low-grade fever.  Heart rate 102.  Serum creatinine 1.57.  CT scan showed new subcutaneous abscess.  Admitted with surgery consultation.   Assessment & Plan:   Abscess cellulitis left lower quadrant abdominal wall: Patient currently on cefepime and Flagyl.  Pain management.  Blood cultures drawn.  Scheduled for I&D today.  Surgical cultures will be appreciated.  Hopefully abscess has no communication with the bowels.  Chronic medical issues including CAD: Stable.  On beta-blockers.  Brilinta. Hyperlipidemia: On statin. Diabetes: On Tresiba.  Currently remains on sliding scale coverage as he is NPO. Crohn's disease: Without evidence of new colonic abscess or fistula.  He is on immunologic's every 8 weeks.  Followed by [facility].   DVT prophylaxis: heparin injection 5,000 Units Start: 03/12/23 2200   Code Status: Full code Family Communication: None at the bedside Disposition Plan: Status is: Inpatient Remains inpatient appropriate because: Inpatient surgery, IV antibiotics     Consultants:  General surgery GI  Procedures:  Scheduled for I&D  Antimicrobials:  Cefepime and Flagyl 8/21---   Subjective: Patient seen and examined.  Does have moderate pain right lower flank.  Denies any other complaints.  Anticipating for surgery.  Objective: Vitals:   03/12/23 1909 03/12/23 2220 03/13/23 0215 03/13/23 0529  BP: 123/83 125/73 (!) 146/80 (!) 161/90  Pulse: (!) 102 87 81 82  Resp: 18 19 18 18   Temp: 98.9 F (37.2 C) 98.3 F (36.8 C) (!) 97.4 F (36.3 C) 97.6 F (36.4 C)  TempSrc: Oral Oral Oral Oral  SpO2: 98%  99% 99% 100%  Weight:      Height:        Intake/Output Summary (Last 24 hours) at 03/13/2023 0944 Last data filed at 03/13/2023 0600 Gross per 24 hour  Intake 1007.73 ml  Output --  Net 1007.73 ml   Filed Weights   03/12/23 1548  Weight: 93.9 kg    Examination:  General exam: Appears calm and comfortable  Respiratory system: No added sounds. Cardiovascular system: S1 & S2 heard, RRR. No pedal edema. Gastrointestinal system: Abdomen is nondistended, soft and nontender. Heart indurated palpable abscess left lower quadrant with no opening. Central nervous system: Alert and oriented. No focal neurological deficits. Extremities: Symmetric 5 x 5 power. Skin: No rashes, lesions or ulcers Psychiatry: Judgement and insight appear normal. Mood & affect appropriate.     Data Reviewed: I have personally reviewed following labs and imaging studies  CBC: Recent Labs  Lab 03/12/23 1624  WBC 10.6*  NEUTROABS 8.2*  HGB 13.4  HCT 40.5  MCV 90.2  PLT 170   Basic Metabolic Panel: Recent Labs  Lab 03/12/23 1624  NA 130*  K 4.2  CL 94*  CO2 24  GLUCOSE 296*  BUN 18  CREATININE 1.57*  CALCIUM 9.4   GFR: Estimated Creatinine Clearance: 61.2 mL/min (A) (by C-G formula based on SCr of 1.57 mg/dL (H)). Liver Function Tests: Recent Labs  Lab 03/12/23 1624  AST 14*  ALT 13  ALKPHOS 69  BILITOT 1.4*  PROT 9.1*  ALBUMIN 3.4*   No results for input(s): "LIPASE", "AMYLASE" in the last 168 hours. No results for input(s): "AMMONIA"  in the last 168 hours. Coagulation Profile: No results for input(s): "INR", "PROTIME" in the last 168 hours. Cardiac Enzymes: No results for input(s): "CKTOTAL", "CKMB", "CKMBINDEX", "TROPONINI" in the last 168 hours. BNP (last 3 results) No results for input(s): "PROBNP" in the last 8760 hours. HbA1C: Recent Labs    03/12/23 1624  HGBA1C 8.9*   CBG: Recent Labs  Lab 03/12/23 2032 03/13/23 0000 03/13/23 0427 03/13/23 0715  GLUCAP 246*  208* 192* 129*   Lipid Profile: No results for input(s): "CHOL", "HDL", "LDLCALC", "TRIG", "CHOLHDL", "LDLDIRECT" in the last 72 hours. Thyroid Function Tests: No results for input(s): "TSH", "T4TOTAL", "FREET4", "T3FREE", "THYROIDAB" in the last 72 hours. Anemia Panel: No results for input(s): "VITAMINB12", "FOLATE", "FERRITIN", "TIBC", "IRON", "RETICCTPCT" in the last 72 hours. Sepsis Labs: Recent Labs  Lab 03/12/23 1645 03/12/23 1953  LATICACIDVEN 1.3 0.8    Recent Results (from the past 240 hour(s))  Culture, blood (Routine x 2)     Status: None (Preliminary result)   Collection Time: 03/12/23  4:24 PM   Specimen: BLOOD  Result Value Ref Range Status   Specimen Description   Final    BLOOD SITE NOT SPECIFIED Performed at [facility], [address], [place], [place] [address]    Special Requests   Final    BOTTLES DRAWN AEROBIC AND ANAEROBIC Blood Culture adequate volume Performed at [facility], [address], [place], [place] [address]    Culture   Final    NO GROWTH < 12 HOURS Performed at [facility], [address], [place], [place] [address]    Report Status PENDING  Incomplete  Culture, blood (Routine x 2)     Status: None (Preliminary result)   Collection Time: 03/12/23  4:30 PM   Specimen: BLOOD  Result Value Ref Range Status   Specimen Description   Final    BLOOD SITE NOT SPECIFIED Performed at [facility], [address], [place], [place] [address]    Special Requests   Final    BOTTLES DRAWN AEROBIC AND ANAEROBIC Blood Culture adequate volume Performed at [facility], [address], [place], [place] [address]    Culture   Final    NO GROWTH < 12 HOURS Performed at [facility], [address] [place], [place] [address]    Report Status PENDING  Incomplete         Radiology Studies: CT ABDOMEN PELVIS W CONTRAST  Result Date: 03/12/2023 CLINICAL  DATA:  Crohn's disease with worsening left lower quadrant abdominal pain and drainage. Fever today. Known history of fistulas EXAM: CT ABDOMEN AND PELVIS WITH CONTRAST TECHNIQUE: Multidetector CT imaging of the abdomen and pelvis was performed using the standard protocol following bolus administration of intravenous contrast. RADIATION DOSE REDUCTION: This exam was performed according to the departmental dose-optimization program which includes automated exposure control, adjustment of the mA and/or kV according to patient size and/or use of iterative reconstruction technique. CONTRAST:  OMNIPAQUE IOHEXOL 300 MG/ML  SOLN COMPARISON:  CT 12/20/2022 and older FINDINGS: Lower chest: There is some linear opacity lung bases likely scar or atelectasis. No pleural effusion. Hepatobiliary: No focal liver abnormality is seen. No gallbladder wall thickening, or biliary dilatation. Gallstones. Patent portal vein. Pancreas: Unremarkable. No pancreatic ductal dilatation or surrounding inflammatory changes. Spleen: Normal in size without focal abnormality. Adrenals/Urinary Tract: Adrenal glands are preserved. No enhancing renal mass or collecting system dilatation. The ureters have normal  course and caliber extending down to the bladder preserved contours of the urinary bladder Stomach/Bowel: No oral contrast. The stomach is underdistended. Normal course and caliber of the duodenum. Surgical changes along the bowel with previous colectomy. Anastomosis at the rectosigmoid region with surgical changes. Once again there is some thickening in the presacral space with a small area of air and fluid posteriorly to the course of the bowel in this location. Please correlate for surgical change or contained leak. Again unchanged. Multiple setons are identified along the anorectal region with soft tissue thickening extending into the left ischioanal fossa and along the left gluteal cleft. The previous fluid collections are no longer  identified, more residual soft tissue thickening. Vascular/Lymphatic: Normal caliber aorta and IVC with scattered vascular calcifications. There are some prominent pelvic nodes identified which could be reactive, pelvic sidewall and inguinal regions, left-greater-than-right. Reproductive: Mildly enlarged prostate. Other: No free intra-air. There is inflammatory stranding with soft tissue gas along the subcutaneous fat anterior to the left hip and along the left hemipelvis anterior laterally. This new from the prior CT scan. There is skin thickening and stranding in this location. There is no obvious tract extending into the peritoneal cavity in this location. Please correlate for soft tissue infection or other process. Musculoskeletal: Degenerative changes seen of the spine and pelvis. IMPRESSION: Surgical changes of previous subtotal colectomy with soft tissue thickening in the area of the surgical anastomosis in the low pelvis. There continues to be soft tissue thickening and some air with fluid extending into the presacral space, similar to previous. Multiple setons in place along the anal region consistent with multiple fistula. The fluid collection along the ischioanal fossa and fat deep to the gluteal cleft on the left is improving with significant residual soft tissue thickening and stranding. No soft tissue gas in this location. However there is a new area of stranding, skin thickening with soft tissue gas along the subcutaneous fat anterolateral along the left hemipelvis and anterior to the left hip. Please correlate for etiology including soft tissue injury or infection. No clear fistula track extends into the peritoneal space from this location. Please correlate with clinical history. Gallstones. Electronically Signed   By: Karen Kays M.D.   On: 03/12/2023 18:11        Scheduled Meds:  carvedilol  6.25 mg Oral q morning   And   carvedilol  12.5 mg Oral QPM   heparin  5,000 Units Subcutaneous  Q8H   insulin aspart  0-20 Units Subcutaneous Q4H   insulin glargine-yfgn  38 Units Subcutaneous QHS   pantoprazole  40 mg Oral BID   rosuvastatin  40 mg Oral Daily   ticagrelor  90 mg Oral BID   Continuous Infusions:  sodium chloride 75 mL/hr at 03/12/23 2154   ceFEPime (MAXIPIME) IV 2 g (03/13/23 0743)   metronidazole 500 mg (03/13/23 0457)     LOS: 1 day    Time spent: 35 minutes    Dorcas Carrow, MD [facility]

## 2023-03-13 NOTE — Anesthesia Postprocedure Evaluation (Signed)
Anesthesia Post Note  Patient: Sean Schaefer  Procedure(s) Performed: INCISION AND DRAINAGE ABSCESS (Left)     Patient location during evaluation: PACU Anesthesia Type: General Level of consciousness: awake and alert, patient cooperative and oriented Pain management: pain level controlled Vital Signs Assessment: post-procedure vital signs reviewed and stable Respiratory status: spontaneous breathing, nonlabored ventilation, respiratory function stable and patient connected to nasal cannula oxygen Cardiovascular status: blood pressure returned to baseline and stable Postop Assessment: no apparent nausea or vomiting Anesthetic complications: no   No notable events documented.  Last Vitals:  Vitals:   03/13/23 1300 03/13/23 1315  BP: (!) 140/88 (!) 166/92  Pulse: 88 85  Resp: 14 12  Temp:  36.6 C  SpO2: 100% 100%    Last Pain:  Vitals:   03/13/23 1300  TempSrc:   PainSc: 3                  Neytiri Asche,E. Elliyah Liszewski

## 2023-03-13 NOTE — Progress Notes (Signed)
[facility] Pre-op Review  Pre-op checklist: asked floor rn to complete  NPO: since MN 03/13/2023  Labs: WNL, CBG 129 @0715   Consent: no order at this time  H&P: consult note 8/21  Vitals: WNL  O2 requirements: RA 100%  MAR/PTA review: completed Cefipime q8 due at 1500 Humalog given at 0737-3 units Flagyl q12 due at 1630 Brilinta LD 2300 8/21  IV: 20g RAC  Floor nurse name:  Bunnie Pion [facility]  Additional info:

## 2023-03-13 NOTE — Transfer of Care (Signed)
Immediate Anesthesia Transfer of Care Note  Patient: Sean Schaefer  Procedure(s) Performed: INCISION AND DRAINAGE ABSCESS (Left)  Patient Location: PACU  Anesthesia Type:General  Level of Consciousness: awake  Airway & Oxygen Therapy: Patient Spontanous Breathing and Patient connected to face mask oxygen  Post-op Assessment: Report given to RN and Post -op Vital signs reviewed and stable  Post vital signs: Reviewed and stable  Last Vitals:  Vitals Value Taken Time  BP 139/89 03/13/23 1218  Temp    Pulse 93   Resp 17 03/13/23 1220  SpO2 100%   Vitals shown include unfiled device data.  Last Pain:  Vitals:   03/13/23 1007  TempSrc: Oral  PainSc:          Complications: No notable events documented.

## 2023-03-13 NOTE — Plan of Care (Signed)
  Problem: Clinical Measurements: Goal: Ability to maintain clinical measurements within normal limits will improve Outcome: Progressing Goal: Will remain free from infection Outcome: Progressing   Problem: Activity: Goal: Risk for activity intolerance will decrease Outcome: Progressing   Problem: Nutrition: Goal: Adequate nutrition will be maintained Outcome: Progressing   Problem: Elimination: Goal: Will not experience complications related to bowel motility Outcome: Progressing Goal: Will not experience complications related to urinary retention Outcome: Progressing   Problem: Pain Managment: Goal: General experience of comfort will improve Outcome: Progressing   Problem: Safety: Goal: Ability to remain free from injury will improve Outcome: Progressing   Problem: Clinical Measurements: Goal: Postoperative complications will be avoided or minimized Outcome: Progressing   Problem: Skin Integrity: Goal: Demonstration of wound healing without infection will improve Outcome: Progressing

## 2023-03-13 NOTE — Consult Note (Addendum)
Consultation Note   Referring Provider:  [facility] PCP: Renford Dills, MD Primary Gastroenterologist: Amada Jupiter, MD        Reason for Consultation: Complicated Crohn's  DOA: 03/12/2023         Hospital Day: 2   ASSESSMENT & PLAN   57 y.o. year old male with a history of  CAD / NSTEMI / stenting, DM, HLD, complicated fistulizing perianal Crohn's disease.   57 yo male with perianal Crohn's disease with recurrent fistulas / abscesses. Recently started [employer] after developing side effects / no responding to Remicade. Admitted with LLQ abscess Just started Skyrizzi in July so maybe inadequate of time to respond?  --On Maxipime and Flagyl --Surgery taking for I+D today.  --DVT prophylaxis   Agree with Ms. Dorice Lamas assessment and plan. Iva Boop, MD, Clementeen Graham    HISTORY OF PRESENT ILLNESS    Sean Schaefer was diagnosed with ulcerative colitis in the 1980s and underwent total proctocolectomy with end ileostomy. Later, he underwent J-pouch creation. Around 2015 he was treated temporarily with Humira but apparently didn't respond and was not treated thereafter for many years. He began having perianal abscesses.     In May 2022 he had drainage of a left gluteal abscess by Dr. Sophronia Simas.  In July 2022 he went back to surgery  ( Dr. Angelena Form) for I+ D of a perianal abscess and Seton placement x 2.    In Sept 2022 he underwent left perirectal / gluteal abscess by Dr. Bedelia Person. Sean Schaefer.   He established care with [place] in Sept 2022.   In November 2022 we started him on Remicade . Developed headaches with 10 mg / kg so dose decreased to 5 mg in January 2023. At some point he developed another abscess requiring I+D.   In January 2023 we added Azathioprine.He was doing well at follow up May 2023.    Sean Schaefer began having recurrent problems several months later and in June 2024 required another I+D of buttock abscess.  Surgery offered a  permanent ileostomy but Sean Schaefer declined.   In July 2024 he was changed to [facility] ( first dose 01/20/23). We referred him to IBD specialist at [facility] but group was not in his insurance network.   INTERVAL HISTORY:  Sean Schaefer presented to ED yesterday with fever and LLQ abscess ( ? SQ) . He has had his second Skyrizzi infusion. About one week ago he began to developed pain, redness ,  and swelling  in LLQ ( below hip). Develops fevers. Abscess drained blood initially , then white fluid but no drainage in last 2 days.  He has been tolerating [employer]. No other complaints. Bowel movements are at baseline which is of mushy consistency. He has been evaluated by surgery and plan is for I+D to control infection and determine cause.    ED / Admission workup notable for :   WBC 10.6 Hgb 13.4 Na 130 Gluc 295 Cr 1.57  CT AP with contrast  Surgical changes of previous subtotal colectomy with soft tissue thickening in the area of the surgical anastomosis in the low pelvis. There continues to be soft tissue thickening and some air with fluid extending into the presacral space, similar to previous.   Multiple setons  in place along the anal region consistent with multiple fistula. The fluid collection along the ischioanal fossa and fat deep to the gluteal cleft on the left is improving with significant residual soft tissue thickening and stranding. No soft tissue gas in this location. However there is a new area of stranding, skin thickening with soft tissue gas along the subcutaneous fat anterolateral along the left hemipelvis and anterior to the left hip. Please correlate for etiology including soft tissue injury or infection. No clear fistula track extends into the peritoneal space from this location. Gallstones.  Previous GI evaluation Pouchoscopy 12/20/21 - Intact post-surgical anastomosis found on digital exam. - The ileoanal pouch and neo-terminal ileum are normal. - No specimens collected.  Labs and  Imaging: Recent Labs    03/12/23 1624  WBC 10.6*  HGB 13.4  HCT 40.5  PLT 170   Recent Labs    03/12/23 1624  NA 130*  K 4.2  CL 94*  CO2 24  GLUCOSE 296*  BUN 18  CREATININE 1.57*  CALCIUM 9.4   Recent Labs    03/12/23 1624  PROT 9.1*  ALBUMIN 3.4*  AST 14*  ALT 13  ALKPHOS 69  BILITOT 1.4*   No results for input(s): "HEPBSAG", "HCVAB", "HEPAIGM", "HEPBIGM" in the last 72 hours. No results for input(s): "LABPROT", "INR" in the last 72 hours.    Past Medical History:  Diagnosis Date   CAD (coronary artery disease)    Crohn's disease (HCC)    Diabetes mellitus (HCC)    Dilation of aorta (HCC)    Hypertension    Ulcerative colitis (HCC)     Past Surgical History:  Procedure Laterality Date   BIOPSY  12/12/2022   Procedure: BIOPSY;  Surgeon: Benancio Deeds, MD;  Location: [facility] ENDOSCOPY;  Service: Gastroenterology;;   COLON SURGERY     COLONOSCOPY     CORONARY BALLOON ANGIOPLASTY N/A 10/20/2022   Procedure: CORONARY BALLOON ANGIOPLASTY;  Surgeon: Marykay Lex, MD;  Location: [facility] INVASIVE CV LAB;  Service: Cardiovascular;  Laterality: N/A;   CORONARY STENT INTERVENTION N/A 10/20/2022   Procedure: CORONARY STENT INTERVENTION;  Surgeon: Marykay Lex, MD;  Location: [facility] INVASIVE CV LAB;  Service: Cardiovascular;  Laterality: N/A;   ESOPHAGOGASTRODUODENOSCOPY (EGD) WITH PROPOFOL N/A 12/12/2022   Procedure: ESOPHAGOGASTRODUODENOSCOPY (EGD) WITH PROPOFOL;  Surgeon: Benancio Deeds, MD;  Location: [facility] ENDOSCOPY;  Service: Gastroenterology;  Laterality: N/A;   EYE SURGERY Left    FLEXIBLE SIGMOIDOSCOPY N/A 02/05/2021   Procedure: FLEXIBLE POUCHOSCOPY WITH BIOPSY;  Surgeon: Andria Meuse, MD;  Location: [facility] ORS;  Service: General;  Laterality: N/A;   INCISION AND DRAINAGE ABSCESS N/A 02/05/2021   Procedure: INCISION AND DRAINAGE OF PERIANAL ABSCESS;  Surgeon: Andria Meuse, MD;  Location: [facility] ORS;  Service: General;  Laterality: N/A;   INCISION  AND DRAINAGE ABSCESS N/A 12/21/2022   Procedure: INCISION AND DRAINAGE ABSCESS;  Surgeon: Berna Bue, MD;  Location: [facility] OR;  Service: General;  Laterality: N/A;   INCISION AND DRAINAGE PERIRECTAL ABSCESS N/A 11/30/2020   Procedure: IRRIGATION AND DEBRIDEMENT PERIRECTAL ABSCESS;  Surgeon: Fritzi Mandes, MD;  Location: [facility] OR;  Service: General;  Laterality: N/A;   INCISION AND DRAINAGE PERIRECTAL ABSCESS Left 04/03/2021   Procedure: left peri-rectal/gluteal abscess;  Surgeon: Diamantina Monks, MD;  Location: [facility] OR;  Service: General;  Laterality: Left;   LEFT HEART CATH AND CORONARY ANGIOGRAPHY N/A 10/20/2022   Procedure: LEFT HEART CATH AND CORONARY ANGIOGRAPHY;  Surgeon: Herbie Baltimore,  Piedad Climes, MD;  Location: [facility] INVASIVE CV LAB;  Service: Cardiovascular;  Laterality: N/A;   Perianal fistula repair  07/22/2010   PLACEMENT OF SETON N/A 02/05/2021   Procedure: PLACEMENT OF SETONS X2;  Surgeon: Andria Meuse, MD;  Location: [facility] ORS;  Service: General;  Laterality: N/A;   RECTAL EXAM UNDER ANESTHESIA N/A 02/05/2021   Procedure: ANORECTAL EXAM UNDER ANESTHESIA;  Surgeon: Andria Meuse, MD;  Location: [facility] ORS;  Service: General;  Laterality: N/A;   TESTICLE REMOVAL Left 2000    Family History  Problem Relation Age of Onset   Hypertension Mother    Cancer Mother    Hypertension Father    Inflammatory bowel disease Brother    Diabetes Paternal Uncle    Diabetes Paternal Grandmother    Diabetes Paternal Grandfather    Heart disease Neg Hx    Colon cancer Neg Hx    Esophageal cancer Neg Hx    Rectal cancer Neg Hx    Stomach cancer Neg Hx     Prior to Admission medications   Medication Sig Start Date End Date Taking? Authorizing Provider  aspirin EC 81 MG tablet Take 1 tablet (81 mg total) by mouth daily. Swallow whole. Patient taking differently: Take 81 mg by mouth in the morning. Swallow whole. 10/23/22  Yes Zannie Cove, MD  carvedilol (COREG) 12.5 MG tablet Take 6.25 mg (  1/2 tablet of 12.5 mg)  in the morning and 12.5 mg  in the evening Patient taking differently: Take 6.25-12.5 mg by mouth See admin instructions. Take 6.25 mg by mouth in the morning and 12.5 mg at bedtime 12/06/22  Yes Marykay Lex, MD  insulin degludec (TRESIBA FLEXTOUCH) 100 UNIT/ML FlexTouch Pen Inject 38 Units into the skin daily. Patient taking differently: Inject 38 Units into the skin at bedtime. 12/25/22  Yes Hongalgi, Maximino Greenland, MD  insulin lispro (HUMALOG) 100 UNIT/ML injection Inject 8 Units into the skin 3 (three) times daily with meals.   Yes [provider]  nitroGLYCERIN (NITROSTAT) 0.4 MG SL tablet Place 1 tablet (0.4 mg total) under the tongue every 5 (five) minutes as needed for chest pain. 11/04/22 03/12/23 Yes Monge, Petra Kuba, NP  ondansetron (ZOFRAN-ODT) 4 MG disintegrating tablet Dissolve 1 tablet (4 mg total) by mouth every 8 (eight) hours as needed for nausea or vomiting. 12/13/22  Yes Dorcas Carrow, MD  rosuvastatin (CRESTOR) 40 MG tablet Take 1 tablet (40 mg total) by mouth daily. 02/24/23  Yes Marykay Lex, MD  SKYRIZI 600 MG/10ML injection Inject 600 mg into the vein See admin instructions. Inject 600 mg administered by intravenous infusion over a period of at least one hour at Week 0, Week 4, and Week 8   Yes [provider]  ticagrelor (BRILINTA) 90 MG TABS tablet Take 1 tablet (90 mg) by mouth 2 times daily. Patient taking differently: Take 90 mg by mouth in the morning and at bedtime. 11/13/22 11/13/23 Yes Monge, Petra Kuba, NP  Continuous Glucose Sensor (FREESTYLE LIBRE 14 DAY SENSOR) MISC Apply 1 sensor to the back of upper arm every 14 days Patient not taking: Reported on 03/12/2023 10/23/22     pantoprazole (PROTONIX) 40 MG tablet Take 1 tablet (40 mg total) by mouth 2 (two) times daily. Patient not taking: Reported on 03/12/2023 12/13/22 12/13/23  Dorcas Carrow, MD  Risankizumab-rzaa [facility]) 180 MG/1.2ML SOCT Inject 1.2 mLs into the skin every 8 (eight)  weeks. Patient not taking: Reported on 03/12/2023 04/14/23  Quentin Angst, MD    Current Facility-Administered Medications  Medication Dose Route Frequency Provider Last Rate Last Admin   0.9 %  sodium chloride infusion   Intravenous Continuous Norins, Rosalyn Gess, MD 75 mL/hr at 03/12/23 2154 New Bag at 03/12/23 2154   carvedilol (COREG) tablet 6.25 mg  6.25 mg Oral q morning Norins, Rosalyn Gess, MD       And   carvedilol (COREG) tablet 12.5 mg  12.5 mg Oral QPM Norins, Rosalyn Gess, MD       ceFEPIme (MAXIPIME) 2 g in sodium chloride 0.9 % 100 mL IVPB  2 g Intravenous Q8H Norins, Rosalyn Gess, MD 200 mL/hr at 03/13/23 0743 2 g at 03/13/23 0743   heparin injection 5,000 Units  5,000 Units Subcutaneous Q8H Norins, Rosalyn Gess, MD   5,000 Units at 03/13/23 0622   insulin aspart (novoLOG) injection 0-20 Units  0-20 Units Subcutaneous Q4H Norins, Rosalyn Gess, MD   3 Units at 03/13/23 0737   insulin glargine-yfgn (SEMGLEE) injection 38 Units  38 Units Subcutaneous QHS Jacques Navy, MD   38 Units at 03/12/23 2300   metroNIDAZOLE (FLAGYL) IVPB 500 mg  500 mg Intravenous Q12H Charlynne Pander, MD 100 mL/hr at 03/13/23 0457 500 mg at 03/13/23 0457   nitroGLYCERIN (NITROSTAT) SL tablet 0.4 mg  0.4 mg Sublingual Q5 min PRN Norins, Rosalyn Gess, MD       ondansetron (ZOFRAN-ODT) disintegrating tablet 4 mg  4 mg Oral Q8H PRN Norins, Rosalyn Gess, MD       pantoprazole (PROTONIX) EC tablet 40 mg  40 mg Oral BID Jacques Navy, MD   40 mg at 03/12/23 2300   rosuvastatin (CRESTOR) tablet 40 mg  40 mg Oral Daily Norins, Rosalyn Gess, MD       ticagrelor [facility]) tablet 90 mg  90 mg Oral BID Jacques Navy, MD   90 mg at 03/12/23 2300   traZODone (DESYREL) tablet 25 mg  25 mg Oral QHS PRN Jacques Navy, MD   25 mg at 03/12/23 2305    Allergies as of 03/12/2023 - Review Complete 03/12/2023  Allergen Reaction Noted   Statins Other (See Comments) 02/09/2021    Social History   Socioeconomic History    Marital status: Married    Spouse name: [place]go Sink   Number of children: 2   Years of education: Not on file   Highest education level: Not on file  Occupational History   Not on file  Tobacco Use   Smoking status: Never   Smokeless tobacco: Never  Vaping Use   Vaping status: Never Used  Substance and Sexual Activity   Alcohol use: No   Drug use: No   Sexual activity: Yes  Other Topics Concern   Not on file  Social History Narrative   Not on file   Social Determinants of Health   Financial Resource Strain: Not on file  Food Insecurity: No Food Insecurity (03/13/2023)   Hunger Vital Sign    Worried About Running Out of Food in the Last Year: Never true    Ran Out of Food in the Last Year: Never true  Transportation Needs: No Transportation Needs (03/13/2023)   PRAPARE - Transportation    Lack of Transportation (Medical): No    Lack of Transportation (Non-Medical): No  Physical Activity: Not on file  Stress: Not on file  Social Connections: Unknown (10/19/2022)   Received from [facility]   Social Network    Social  Network: Not on file  Intimate Partner Violence: Not At Risk (03/13/2023)   Humiliation, Afraid, Rape, and Kick questionnaire    Fear of Current or Ex-Partner: No    Emotionally Abused: No    Physically Abused: No    Sexually Abused: No     Code Status   Code Status: Full Code  Review of Systems: All systems reviewed and negative except where noted in HPI.  Physical Exam: Vital signs in last 24 hours: Temp:  [97.4 F (36.3 C)-100.8 F (38.2 C)] 97.6 F (36.4 C) (08/22 0529) Pulse Rate:  [81-118] 82 (08/22 0529) Resp:  [18-19] 18 (08/22 0529) BP: (104-161)/(73-90) 161/90 (08/22 0529) SpO2:  [95 %-100 %] 100 % (08/22 0529) Weight:  [93.9 kg] 93.9 kg (08/21 1548) Last BM Date : 03/12/23  General:  Pleasant male in NAD Psych:  Cooperative. Normal mood and affect Eyes: Pupils equal Ears:  Normal auditory acuity Nose: No deformity, discharge or  lesions Neck:  Supple, no masses felt Lungs:  Clear to auscultation.  Heart:  Regular rate, regular rhythm.  Abdomen:  Soft, nondistended, erythematous, fluctuant area about size of a plum in  the LLQ, active bowel sounds, no masses felt Rectal :  Deferred Msk: Symmetrical without gross deformities.  Neurologic:  Alert, oriented, grossly normal neurologically Extremities : No edema Skin:  Intact without significant lesions.    Intake/Output from previous day: 08/21 0701 - 08/22 0700 In: 1007.7 [I.V.:607.5; IV Piggyback:400.2] Out: -  Intake/Output this shift:  No intake/output data recorded.  Principal Problem:   Abscess Active Problems:   Crohn's disease (HCC)   Diabetes (HCC)   Hyperlipidemia    Willette Cluster, NP-C   03/13/2023, 8:48 AM

## 2023-03-13 NOTE — Progress Notes (Signed)
   03/13/23 1229  TOC Brief Assessment  Insurance and Status Reviewed  Patient has primary care physician Yes  Home environment has been reviewed Resides with spouse  Prior level of function: Independent at baseline  Prior/Current Home Services No current home services  Social Determinants of Health Reivew SDOH reviewed no interventions necessary  Readmission risk has been reviewed Yes  Transition of care needs no transition of care needs at this time

## 2023-03-13 NOTE — Plan of Care (Signed)
  Problem: Health Behavior/Discharge Planning: Goal: Ability to manage health-related needs will improve Outcome: Progressing   Problem: Clinical Measurements: Goal: Ability to maintain clinical measurements within normal limits will improve Outcome: Progressing Goal: Will remain free from infection Outcome: Progressing Goal: Respiratory complications will improve Outcome: Progressing Goal: Cardiovascular complication will be avoided Outcome: Progressing   Problem: Activity: Goal: Risk for activity intolerance will decrease Outcome: Progressing   Problem: Coping: Goal: Level of anxiety will decrease Outcome: Progressing

## 2023-03-13 NOTE — Interval H&P Note (Signed)
History and Physical Interval Note:  03/13/2023 10:02 AM  Sean Schaefer  has presented today for surgery, with the diagnosis of LEFT HIP ABCESS.  The various methods of treatment have been discussed with the patient and family. After consideration of risks, benefits and other options for treatment, the patient has consented to  Procedure(s): INCISION AND DRAINAGE ABSCESS (Left) as a surgical intervention.  The patient's history has been reviewed, patient examined, no change in status, stable for surgery.  I have reviewed the patient's chart and labs.  Questions were answered to the patient's satisfaction.     [name]

## 2023-03-13 NOTE — Anesthesia Preprocedure Evaluation (Addendum)
Anesthesia Evaluation  Patient identified by MRN, date of birth, ID band Patient awake    Reviewed: Allergy & Precautions, NPO status , Patient's Chart, lab work & pertinent test results, reviewed documented beta blocker date and time   History of Anesthesia Complications Negative for: history of anesthetic complications  Airway Mallampati: II  TM Distance: >3 FB Neck ROM: Full    Dental  (+) Dental Advisory Given   Pulmonary neg pulmonary ROS   breath sounds clear to auscultation       Cardiovascular hypertension, Pt. on medications and Pt. on home beta blockers (-) angina + CAD, + Past MI and + Cardiac Stents (LAD)   Rhythm:Regular Rate:Normal  10/2022 ECHO: EF 70 to 75%.  1. The LV has hyperdynamic function, no regional wall motion abnormalities. There is mild left ventricular hypertrophy.    2. RVF is normal. The right ventricular size is normal.   3. The mitral valve is normal in structure. Trivial MR.   4. The aortic valve is tricuspid. Aortic valve regurgitation is not visualized. Aortic valve sclerosis/calcification is present, without any evidence of aortic stenosis.   5. Aortic dilatation noted. There is moderate dilatation of the ascending aorta, measuring 43 mm.     Neuro/Psych negative neurological ROS     GI/Hepatic Neg liver ROS,GERD  Medicated and Controlled,,H/o Crohn's, ulcerative colitis   Endo/Other  diabetes (glu 103), Insulin Dependent    Renal/GU Renal InsufficiencyRenal disease     Musculoskeletal   Abdominal   Peds  Hematology Brilinta Hb 13.4   Anesthesia Other Findings   Reproductive/Obstetrics                             Anesthesia Physical Anesthesia Plan  ASA: 3  Anesthesia Plan: General   Post-op Pain Management: Tylenol PO (pre-op)*   Induction: Intravenous  PONV Risk Score and Plan: 2 and Ondansetron and Dexamethasone  Airway Management Planned:  Oral ETT  Additional Equipment: None  Intra-op Plan:   Post-operative Plan: Extubation in OR  Informed Consent: I have reviewed the patients History and Physical, chart, labs and discussed the procedure including the risks, benefits and alternatives for the proposed anesthesia with the patient or authorized representative who has indicated his/her understanding and acceptance.     Dental advisory given  Plan Discussed with: CRNA and Surgeon  Anesthesia Plan Comments:         Anesthesia Quick Evaluation

## 2023-03-13 NOTE — Anesthesia Procedure Notes (Signed)
Procedure Name: Intubation Date/Time: 03/13/2023 12:36 PM  Performed by: Ahmed Prima, CRNAPre-anesthesia Checklist: Patient identified, Emergency Drugs available, Suction available and Patient being monitored Patient Re-evaluated:Patient Re-evaluated prior to induction Oxygen Delivery Method: Circle system utilized Preoxygenation: Pre-oxygenation with 100% oxygen Induction Type: IV induction Ventilation: Mask ventilation without difficulty Laryngoscope Size: Miller and 2 Grade View: Grade II Tube type: Oral Tube size: 7.5 mm Number of attempts: 1 Airway Equipment and Method: Stylet and Oral airway Placement Confirmation: ETT inserted through vocal cords under direct vision, positive ETCO2 and breath sounds checked- equal and bilateral Secured at: 23 cm Tube secured with: Tape Dental Injury: Teeth and Oropharynx as per pre-operative assessment

## 2023-03-13 NOTE — Discharge Instructions (Signed)
WOUND CARE: - midline dressing to be changed daily - supplies: sterile saline, gauze, scissors, tape  - remove dressing and all packing carefully, moistening with sterile saline as needed to avoid packing/internal dressing sticking to the wound. - clean edges of skin around the wound with water/gauze, making sure there is no tape debris or leakage left on skin that could cause skin irritation or breakdown. - dampen and clean gauze with sterile saline and pack wound from wound base to skin level, making sure to take note of any possible areas of wound tracking, tunneling and packing appropriately. Wound can be packed loosely. Trim gauze to size if a whole gauze is not required. - cover wound with a dry gauze and secure with tape.  - write the date/time on the dry dressing/tape to better track when the last dressing change occurred. - change dressing as needed if leakage occurs, wound gets contaminated, or patient requests to shower. - patient may shower daily with wound open (i.e. remove all packing) and following the shower the wound should be dried and a clean dressing placed.   **patient provided list of [facility] Primary Care Physicians. She will make her own appointment. ** 

## 2023-03-14 ENCOUNTER — Encounter (HOSPITAL_COMMUNITY): Payer: Self-pay | Admitting: Surgery

## 2023-03-14 ENCOUNTER — Other Ambulatory Visit (HOSPITAL_COMMUNITY): Payer: Self-pay

## 2023-03-14 DIAGNOSIS — L0291 Cutaneous abscess, unspecified: Secondary | ICD-10-CM | POA: Diagnosis not present

## 2023-03-14 DIAGNOSIS — K50114 Crohn's disease of large intestine with abscess: Secondary | ICD-10-CM

## 2023-03-14 LAB — CBC WITH DIFFERENTIAL/PLATELET
Abs Immature Granulocytes: 0.09 10*3/uL — ABNORMAL HIGH (ref 0.00–0.07)
Basophils Absolute: 0 10*3/uL (ref 0.0–0.1)
Basophils Relative: 0 %
Eosinophils Absolute: 0 10*3/uL (ref 0.0–0.5)
Eosinophils Relative: 0 %
HCT: 38.1 % — ABNORMAL LOW (ref 39.0–52.0)
Hemoglobin: 12.2 g/dL — ABNORMAL LOW (ref 13.0–17.0)
Immature Granulocytes: 1 %
Lymphocytes Relative: 6 %
Lymphs Abs: 0.7 10*3/uL (ref 0.7–4.0)
MCH: 29.2 pg (ref 26.0–34.0)
MCHC: 32 g/dL (ref 30.0–36.0)
MCV: 91.1 fL (ref 80.0–100.0)
Monocytes Absolute: 0.8 10*3/uL (ref 0.1–1.0)
Monocytes Relative: 7 %
Neutro Abs: 10.2 10*3/uL — ABNORMAL HIGH (ref 1.7–7.7)
Neutrophils Relative %: 86 %
Platelets: 145 10*3/uL — ABNORMAL LOW (ref 150–400)
RBC: 4.18 MIL/uL — ABNORMAL LOW (ref 4.22–5.81)
RDW: 11.6 % (ref 11.5–15.5)
WBC: 11.8 10*3/uL — ABNORMAL HIGH (ref 4.0–10.5)
nRBC: 0 % (ref 0.0–0.2)

## 2023-03-14 LAB — GLUCOSE, CAPILLARY
Glucose-Capillary: 127 mg/dL — ABNORMAL HIGH (ref 70–99)
Glucose-Capillary: 208 mg/dL — ABNORMAL HIGH (ref 70–99)
Glucose-Capillary: 211 mg/dL — ABNORMAL HIGH (ref 70–99)
Glucose-Capillary: 239 mg/dL — ABNORMAL HIGH (ref 70–99)

## 2023-03-14 LAB — COMPREHENSIVE METABOLIC PANEL
ALT: 12 U/L (ref 0–44)
AST: 15 U/L (ref 15–41)
Albumin: 2.8 g/dL — ABNORMAL LOW (ref 3.5–5.0)
Alkaline Phosphatase: 58 U/L (ref 38–126)
Anion gap: 11 (ref 5–15)
BUN: 27 mg/dL — ABNORMAL HIGH (ref 6–20)
CO2: 21 mmol/L — ABNORMAL LOW (ref 22–32)
Calcium: 8.2 mg/dL — ABNORMAL LOW (ref 8.9–10.3)
Chloride: 99 mmol/L (ref 98–111)
Creatinine, Ser: 1.62 mg/dL — ABNORMAL HIGH (ref 0.61–1.24)
GFR, Estimated: 50 mL/min — ABNORMAL LOW (ref >60)
Glucose, Bld: 215 mg/dL — ABNORMAL HIGH (ref 70–99)
Potassium: 4.5 mmol/L (ref 3.5–5.1)
Sodium: 131 mmol/L — ABNORMAL LOW (ref 135–145)
Total Bilirubin: 1 mg/dL (ref 0.3–1.2)
Total Protein: 7.8 g/dL (ref 6.5–8.1)

## 2023-03-14 LAB — MAGNESIUM: Magnesium: 2.1 mg/dL (ref 1.7–2.4)

## 2023-03-14 LAB — PHOSPHORUS: Phosphorus: 3.9 mg/dL (ref 2.5–4.6)

## 2023-03-14 MED ORDER — DOXYCYCLINE HYCLATE 50 MG PO CAPS
50.0000 mg | ORAL_CAPSULE | Freq: Two times a day (BID) | ORAL | 0 refills | Status: AC
  Filled 2023-03-14: qty 14, 7d supply, fill #0

## 2023-03-14 MED ORDER — OXYCODONE HCL 5 MG PO TABS
5.0000 mg | ORAL_TABLET | Freq: Four times a day (QID) | ORAL | 0 refills | Status: DC | PRN
  Filled 2023-03-14: qty 15, 4d supply, fill #0

## 2023-03-14 NOTE — Progress Notes (Addendum)
Daily Progress Note  DOA: 03/12/2023 Hospital Day: 3  Chief Complaint: Complicated Crohn's disease   ASSESSMENT & PLAN   Brief Narrative:  Sean Schaefer is a 57 y.o. year old male with a history of  CAD / NSTEMI / stenting, DM, HLD, complicated fistulizing perianal Crohn's disease. Admitted 8/12 with left L hip abscess. GI saw in consult 8/22  Perianal Crohn's disease with a history of  recurrent fistulas / abscesses. Recently started [employer] after developing side effects / ? not responding to Remicade. Admitted with a left hip abscess.  --s/p I+D with debridement of left hip subcutaneous abscess on 8/22. Gram stains / cultures pending.  --On Maxipime and Flagyl --Hopefully SQ abscess not Crohn's related??  --Just had first dressing change. He is being discharged home with Doxycycline.  --Afebrile, WBC 11.8 --Proceed with scheduled Sky[facility] infusion on Monday.  --I have made him a follow up with Dr. Myrtie Neither on 11/6 at 1:20 pm  Asymptomatic cholelithiasis    [facility] Attending    I agree with the Advanced Practitioner's note, impression and recommendations.  Sean Boop, MD, [facility] See AMION on call - gastroenterology for best contact person 03/14/2023 5:12 PM    Subjective   Some discomfort after wound change a few minutes ago. Bowel movements at baseline   Objective    Recent Labs    03/12/23 1624 03/14/23 0413  WBC 10.6* 11.8*  HGB 13.4 12.2*  HCT 40.5 38.1*  PLT 170 145*   BMET Recent Labs    03/12/23 1624 03/14/23 0413  NA 130* 131*  K 4.2 4.5  CL 94* 99  CO2 24 21*  GLUCOSE 296* 215*  BUN 18 27*  CREATININE 1.57* 1.62*  CALCIUM 9.4 8.2*   LFT Recent Labs    03/14/23 0413  PROT 7.8  ALBUMIN 2.8*  AST 15  ALT 12  ALKPHOS 58  BILITOT 1.0   PT/INR No results for input(s): "LABPROT", "INR" in the last 72 hours.   Imaging:  CT ABDOMEN PELVIS W CONTRAST CLINICAL DATA:  Crohn's disease with worsening  left lower quadrant abdominal pain and drainage. Fever today. Known history of fistulas  EXAM: CT ABDOMEN AND PELVIS WITH CONTRAST  TECHNIQUE: Multidetector CT imaging of the abdomen and pelvis was performed using the standard protocol following bolus administration of intravenous contrast.  RADIATION DOSE REDUCTION: This exam was performed according to the departmental dose-optimization program which includes automated exposure control, adjustment of the mA and/or kV according to patient size and/or use of iterative reconstruction technique.  CONTRAST:  OMNIPAQUE IOHEXOL 300 MG/ML  SOLN  COMPARISON:  CT 12/20/2022 and older  FINDINGS: Lower chest: There is some linear opacity lung bases likely scar or atelectasis. No pleural effusion.  Hepatobiliary: No focal liver abnormality is seen. No gallbladder wall thickening, or biliary dilatation. Gallstones. Patent portal vein.  Pancreas: Unremarkable. No pancreatic ductal dilatation or surrounding inflammatory changes.  Spleen: Normal in size without focal abnormality.  Adrenals/Urinary Tract: Adrenal glands are preserved. No enhancing renal mass or collecting system dilatation. The ureters have normal course and caliber extending down to the bladder preserved contours of the urinary bladder  Stomach/Bowel: No oral contrast. The stomach is underdistended. Normal course and caliber of the duodenum. Surgical changes along the bowel with previous colectomy. Anastomosis at the rectosigmoid region with surgical changes. Once again there is some thickening in the presacral space with a small area of air and fluid posteriorly to the  course of the bowel in this location. Please correlate for surgical change or contained leak. Again unchanged. Multiple setons are identified along the anorectal region with soft tissue thickening extending into the left ischioanal fossa and along the left gluteal cleft. The previous fluid  collections are no longer identified, more residual soft tissue thickening.  Vascular/Lymphatic: Normal caliber aorta and IVC with scattered vascular calcifications. There are some prominent pelvic nodes identified which could be reactive, pelvic sidewall and inguinal regions, left-greater-than-right.  Reproductive: Mildly enlarged prostate.  Other: No free intra-air.  There is inflammatory stranding with soft tissue gas along the subcutaneous fat anterior to the left hip and along the left hemipelvis anterior laterally. This new from the prior CT scan. There is skin thickening and stranding in this location. There is no obvious tract extending into the peritoneal cavity in this location. Please correlate for soft tissue infection or other process.  Musculoskeletal: Degenerative changes seen of the spine and pelvis.  IMPRESSION: Surgical changes of previous subtotal colectomy with soft tissue thickening in the area of the surgical anastomosis in the low pelvis. There continues to be soft tissue thickening and some air with fluid extending into the presacral space, similar to previous.  Multiple setons in place along the anal region consistent with multiple fistula. The fluid collection along the ischioanal fossa and fat deep to the gluteal cleft on the left is improving with significant residual soft tissue thickening and stranding. No soft tissue gas in this location.  However there is a new area of stranding, skin thickening with soft tissue gas along the subcutaneous fat anterolateral along the left hemipelvis and anterior to the left hip. Please correlate for etiology including soft tissue injury or infection. No clear fistula track extends into the peritoneal space from this location. Please correlate with clinical history.  Gallstones.  Electronically Signed   By: Karen Kays M.D.   On: 03/12/2023 18:11     Scheduled inpatient medications:   acetaminophen   1,000 mg Oral Q6H   carvedilol  6.25 mg Oral q morning   And   carvedilol  12.5 mg Oral QPM   heparin  5,000 Units Subcutaneous Q8H   insulin aspart  0-20 Units Subcutaneous Q4H   insulin glargine-yfgn  38 Units Subcutaneous QHS   pantoprazole  40 mg Oral BID   rosuvastatin  40 mg Oral Daily   Continuous inpatient infusions:   sodium chloride 75 mL/hr at 03/14/23 0835   ceFEPime (MAXIPIME) IV 2 g (03/14/23 0537)   metronidazole 500 mg (03/14/23 0414)   PRN inpatient medications: HYDROmorphone (DILAUDID) injection, nitroGLYCERIN, ondansetron, oxyCODONE, traZODone  Vital signs in last 24 hours: Temp:  [97.5 F (36.4 C)-97.9 F (36.6 C)] 97.6 F (36.4 C) (08/23 0952) Pulse Rate:  [76-98] 76 (08/23 0952) Resp:  [12-18] 16 (08/23 0952) BP: (125-173)/(74-96) 141/74 (08/23 0952) SpO2:  [100 %] 100 % (08/23 0952) Last BM Date : 03/12/23  Intake/Output Summary (Last 24 hours) at 03/14/2023 1056 Last data filed at 03/14/2023 1000 Gross per 24 hour  Intake 3872.11 ml  Output 860 ml  Net 3012.11 ml    Intake/Output from previous day: 08/22 0701 - 08/23 0700 In: 3752.1 [P.O.:1080; I.V.:2333.8; IV Piggyback:338.4] Out: 860 [Urine:850; Blood:10] Intake/Output this shift: Total I/O In: 120 [P.O.:120] Out: -    Physical Exam:  General: Alert male in NAD Heart:  Regular rate and rhythm.  Pulmonary: Normal respiratory effort Abdomen: Soft, nondistended, nontender. Normal bowel sounds. Neurologic: Alert and oriented Psych:  Pleasant. Cooperative. Insight appears normal.    Principal Problem:   Abscess Active Problems:   Crohn's disease (HCC)   Diabetes (HCC)   Hyperlipidemia     LOS: 2 days   Willette Cluster ,NP 03/14/2023, 10:56 AM

## 2023-03-14 NOTE — Discharge Summary (Signed)
Physician Discharge Summary  Sean Schaefer:[id number] DOB: July 07, 1966 DOA: 03/12/2023  PCP: Renford Dills, MD  Admit date: 03/12/2023 Discharge date: 03/14/2023  Admitted From: Home Disposition: Home  Recommendations for Outpatient Follow-up:  Follow-up as already scheduled  Discharge Condition: Stable CODE STATUS: Full code Diet recommendation: Low-salt and low-carb diet.  Nutritional supplements.  Discharge summary: [name], coronary artery disease status post MI and currently on Brilinta, hyperlipidemia, Crohn's colitis with multiple previous surgeries and rectal abscesses presented with left lower quadrant abscess for about a week. Low-grade fever. Heart rate 102. Serum creatinine 1.57. CT scan showed new subcutaneous abscess. Admitted with surgery consultation.   Patient was started on antibiotics.  He underwent incision and drainage with debridement of subcutaneous abscess, found to have no muscle involved, no tracking into the viscera.  Blood cultures negative.  Tissue culture growing Streptococcus.  With clinical improvement, going home with dressing changes, pain management.  Doxycycline as prescribed.  Resume all long-term medications including insulin.  Stable for discharge.  Discharge Diagnoses:  Principal Problem:   Abscess Active Problems:   Crohn's disease (HCC)   Diabetes (HCC)   Hyperlipidemia    Discharge Instructions  Discharge Instructions     Diet - low sodium heart healthy   Complete by: As directed    Diet Carb Modified   Complete by: As directed    Discharge wound care:   Complete by: As directed    Dressing as per surgery recommendation   Increase activity slowly   Complete by: As directed       Allergies as of 03/14/2023       Reactions   Statins Other (See Comments)   Myalgias         Medication List     TAKE these medications    aspirin EC 81 MG tablet Take 1 tablet (81 mg total) by mouth daily. Swallow  whole. What changed: when to take this   Brilinta 90 MG Tabs tablet Generic drug: ticagrelor Take 1 tablet (90 mg) by mouth 2 times daily. What changed: when to take this   carvedilol 12.5 MG tablet Commonly known as: COREG Take 6.25 mg ( 1/2 tablet of 12.5 mg)  in the morning and 12.5 mg  in the evening What changed:  how much to take how to take this when to take this additional instructions   doxycycline 50 MG capsule Commonly known as: VIBRAMYCIN Take 1 capsule (50 mg total) by mouth 2 (two) times daily for 7 days.   FreeStyle Libre 14 Day Sensor Misc Apply 1 sensor to the back of upper arm every 14 days   insulin lispro 100 UNIT/ML injection Commonly known as: HUMALOG Inject 8 Units into the skin 3 (three) times daily with meals.   nitroGLYCERIN 0.4 MG SL tablet Commonly known as: NITROSTAT Place 1 tablet (0.4 mg total) under the tongue every 5 (five) minutes as needed for chest pain.   ondansetron 4 MG disintegrating tablet Commonly known as: ZOFRAN-ODT Dissolve 1 tablet (4 mg total) by mouth every 8 (eight) hours as needed for nausea or vomiting.   oxyCODONE 5 MG immediate release tablet Commonly known as: Oxy IR/ROXICODONE Take 1 tablet (5 mg total) by mouth every 6 (six) hours as needed for severe pain.   pantoprazole 40 MG tablet Commonly known as: Protonix Take 1 tablet (40 mg total) by mouth 2 (two) times daily.   rosuvastatin 40 MG tablet Commonly known as: Crestor Take 1 tablet (40 mg  total) by mouth daily.   Skyrizi 600 MG/10ML injection Generic drug: risankizumab-rzaa Inject 600 mg into the vein See admin instructions. Inject 600 mg administered by intravenous infusion over a period of at least one hour at Week 0, Week 4, and Week 8   Skyrizi 180 MG/1.2ML Soct Generic drug: Risankizumab-rzaa Inject 1.2 mLs into the skin every 8 (eight) weeks. Start taking on: April 14, 2023   [name] FlexTouch 100 UNIT/ML FlexTouch Pen Generic drug:  insulin degludec Inject 38 Units into the skin daily. What changed: when to take this               Discharge Care Instructions  (From admission, onward)           Start     Ordered   03/14/23 0000  Discharge wound care:       Comments: Dressing as per surgery recommendation   03/14/23 1044            Follow-up Information     Maczis, Hedda Slade, PA-C Follow up on 04/03/2023.   Specialty: General Surgery Why: 2:45am, Arrive 15 minutes prior to your appointment time, Please bring your insurance card and photo ID Contact information: [address] [facility] [place] [address] [phone]                Allergies  Allergen Reactions   Statins Other (See Comments)    Myalgias      Consultations: General Surgery GI   Procedures/Studies: CT ABDOMEN PELVIS W CONTRAST  Result Date: 03/12/2023 CLINICAL DATA:  Crohn's disease with worsening left lower quadrant abdominal pain and drainage. Fever today. Known history of fistulas EXAM: CT ABDOMEN AND PELVIS WITH CONTRAST TECHNIQUE: Multidetector CT imaging of the abdomen and pelvis was performed using the standard protocol following bolus administration of intravenous contrast. RADIATION DOSE REDUCTION: This exam was performed according to the departmental dose-optimization program which includes automated exposure control, adjustment of the mA and/or kV according to patient size and/or use of iterative reconstruction technique. CONTRAST:  OMNIPAQUE IOHEXOL 300 MG/ML  SOLN COMPARISON:  CT 12/20/2022 and older FINDINGS: Lower chest: There is some linear opacity lung bases likely scar or atelectasis. No pleural effusion. Hepatobiliary: No focal liver abnormality is seen. No gallbladder wall thickening, or biliary dilatation. Gallstones. Patent portal vein. Pancreas: Unremarkable. No pancreatic ductal dilatation or surrounding inflammatory changes. Spleen: Normal in size without  focal abnormality. Adrenals/Urinary Tract: Adrenal glands are preserved. No enhancing renal mass or collecting system dilatation. The ureters have normal course and caliber extending down to the bladder preserved contours of the urinary bladder Stomach/Bowel: No oral contrast. The stomach is underdistended. Normal course and caliber of the duodenum. Surgical changes along the bowel with previous colectomy. Anastomosis at the rectosigmoid region with surgical changes. Once again there is some thickening in the presacral space with a small area of air and fluid posteriorly to the course of the bowel in this location. Please correlate for surgical change or contained leak. Again unchanged. Multiple setons are identified along the anorectal region with soft tissue thickening extending into the left ischioanal fossa and along the left gluteal cleft. The previous fluid collections are no longer identified, more residual soft tissue thickening. Vascular/Lymphatic: Normal caliber aorta and IVC with scattered vascular calcifications. There are some prominent pelvic nodes identified which could be reactive, pelvic sidewall and inguinal regions, left-greater-than-right. Reproductive: Mildly enlarged prostate. Other: No free intra-air. There is inflammatory stranding with soft tissue  gas along the subcutaneous fat anterior to the left hip and along the left hemipelvis anterior laterally. This new from the prior CT scan. There is skin thickening and stranding in this location. There is no obvious tract extending into the peritoneal cavity in this location. Please correlate for soft tissue infection or other process. Musculoskeletal: Degenerative changes seen of the spine and pelvis. IMPRESSION: Surgical changes of previous subtotal colectomy with soft tissue thickening in the area of the surgical anastomosis in the low pelvis. There continues to be soft tissue thickening and some air with fluid extending into the presacral  space, similar to previous. Multiple setons in place along the anal region consistent with multiple fistula. The fluid collection along the ischioanal fossa and fat deep to the gluteal cleft on the left is improving with significant residual soft tissue thickening and stranding. No soft tissue gas in this location. However there is a new area of stranding, skin thickening with soft tissue gas along the subcutaneous fat anterolateral along the left hemipelvis and anterior to the left hip. Please correlate for etiology including soft tissue injury or infection. No clear fistula track extends into the peritoneal space from this location. Please correlate with clinical history. Gallstones. Electronically Signed   By: Karen Kays M.D.   On: 03/12/2023 18:11   (Echo, Carotid, EGD, Colonoscopy, ERCP)    Subjective: Seen and examined.  Mild to moderate pain but tolerable and managed with oral pain medications.  Afebrile.  Walking around in the hallway.   Discharge Exam: Vitals:   03/14/23 0613 03/14/23 0952  BP: (!) 157/87 (!) 141/74  Pulse: 78 76  Resp: 16 16  Temp: (!) 97.5 F (36.4 C) 97.6 F (36.4 C)  SpO2: 100% 100%   Vitals:   03/13/23 2039 03/14/23 0029 03/14/23 0613 03/14/23 0952  BP: (!) 157/96 139/84 (!) 157/87 (!) 141/74  Pulse: 98 85 78 76  Resp: 17 17 16 16   Temp: 97.7 F (36.5 C) (!) 97.5 F (36.4 C) (!) 97.5 F (36.4 C) 97.6 F (36.4 C)  TempSrc: Oral Oral Oral Oral  SpO2: 100% 100% 100% 100%  Weight:      Height:        General: Pt is alert, awake, not in acute distress Cardiovascular: RRR, S1/S2 +, no rubs, no gallops Respiratory: CTA bilaterally, no wheezing, no rhonchi Abdominal: Soft, NT, ND, bowel sounds + Extremities: no edema, no cyanosis Left lower quadrant postsurgical incision, clean margins.  Picture in the chart.     The results of significant diagnostics from this hospitalization (including imaging, microbiology, ancillary and laboratory) are listed  below for reference.     Microbiology: Recent Results (from the past 240 hour(s))  Culture, blood (Routine x 2)     Status: None (Preliminary result)   Collection Time: 03/12/23  4:24 PM   Specimen: BLOOD  Result Value Ref Range Status   Specimen Description   Final    BLOOD SITE NOT SPECIFIED Performed at [facility], [address], [place], [place] [address]    Special Requests   Final    BOTTLES DRAWN AEROBIC AND ANAEROBIC Blood Culture adequate volume Performed at [facility], [address], [place], [place] [address]    Culture   Final    NO GROWTH 2 DAYS Performed at [facility], [address], [place], [place] [address]    Report Status PENDING  Incomplete  Culture, blood (Routine x 2)  Status: None (Preliminary result)   Collection Time: 03/12/23  4:30 PM   Specimen: BLOOD  Result Value Ref Range Status   Specimen Description   Final    BLOOD SITE NOT SPECIFIED Performed at [facility], [address], [place], [place] [address]    Special Requests   Final    BOTTLES DRAWN AEROBIC AND ANAEROBIC Blood Culture adequate volume Performed at [facility], [address], [place], [place] [address]    Culture   Final    NO GROWTH 2 DAYS Performed at [facility], [address], [place], [place] [address]    Report Status PENDING  Incomplete  Surgical pcr screen     Status: None   Collection Time: 03/13/23 10:35 AM   Specimen: Nasal Mucosa; Nasal Swab  Result Value Ref Range Status   MRSA, PCR NEGATIVE NEGATIVE Final   Staphylococcus aureus NEGATIVE NEGATIVE Final    Comment: (NOTE) The Xpert SA Assay (FDA approved for NASAL specimens in patients 71 years of age and older), is one component of a comprehensive surveillance program. It is not intended to diagnose infection nor to guide or monitor treatment. Performed at [facility], [address], [place], [place] [address]   Aerobic/Anaerobic Culture w Gram Stain (surgical/deep wound)     Status: None (Preliminary result)   Collection Time: 03/13/23 11:56 AM   Specimen: Abscess  Result Value Ref Range Status   Specimen Description   Final    ABSCESS LEFT HIP Performed at [facility], [address], [place], [place] [address]    Special Requests   Final    NONE Performed at [facility], [address] [place], [place] [address]    Gram Stain   Final    MODERATE WBC PRESENT, PREDOMINANTLY PMN MODERATE GRAM POSITIVE COCCI FEW GRAM POSITIVE RODS    Culture   Final    NO GROWTH < 24 HOURS Performed at [facility], [address] [place], [place] [address]    Report Status PENDING  Incomplete  Aerobic/Anaerobic Culture w Gram Stain (surgical/deep wound)     Status: None (Preliminary result)   Collection Time: 03/13/23 11:59 AM   Specimen: Abscess  Result Value Ref Range Status   Specimen Description   Final    ABSCESS LEFT HIP TISSUE Performed at [facility], [address] [place], [place] [address]    Special Requests   Final    NONE Performed at [facility], [address] [place], [place] [address]    Gram Stain   Final    FEW WBC PRESENT, PREDOMINANTLY PMN RARE GRAM POSITIVE COCCI IN PAIRS    Culture   Final    CULTURE REINCUBATED FOR BETTER GROWTH Performed at [facility], [address] [place], [place] [address]    Report Status PENDING  Incomplete     Labs: BNP (last 3 results) No results for input(s): "BNP" in the last 8760 hours. Basic Metabolic Panel: Recent Labs  Lab 03/12/23 1624 03/14/23 0413  NA 130* 131*  K 4.2 4.5  CL 94* 99  CO2 24 21*  GLUCOSE 296* 215*  BUN 18 27*  CREATININE 1.57* 1.62*  CALCIUM 9.4 8.2*  MG  --  2.1  PHOS  --  3.9   Liver Function Tests: Recent Labs  Lab 03/12/23 1624 03/14/23 0413  AST 14* 15  ALT 13  12  ALKPHOS 69  58  BILITOT 1.4* 1.0  PROT 9.1* 7.8  ALBUMIN 3.4* 2.8*   No results for input(s): "LIPASE", "AMYLASE" in the last 168 hours. No results for input(s): "AMMONIA" in the last 168 hours. CBC: Recent Labs  Lab 03/12/23 1624 03/14/23 0413  WBC 10.6* 11.8*  NEUTROABS 8.2* 10.2*  HGB 13.4 12.2*  HCT 40.5 38.1*  MCV 90.2 91.1  PLT 170 145*   Cardiac Enzymes: No results for input(s): "CKTOTAL", "CKMB", "CKMBINDEX", "TROPONINI" in the last 168 hours. BNP: Invalid input(s): "POCBNP" CBG: Recent Labs  Lab 03/13/23 1721 03/13/23 2037 03/14/23 0028 03/14/23 0412 03/14/23 0745  GLUCAP 164* 232* 239* 211* 208*   D-Dimer No results for input(s): "DDIMER" in the last 72 hours. Hgb A1c Recent Labs    03/12/23 1624  HGBA1C 8.9*   Lipid Profile No results for input(s): "CHOL", "HDL", "LDLCALC", "TRIG", "CHOLHDL", "LDLDIRECT" in the last 72 hours. Thyroid function studies No results for input(s): "TSH", "T4TOTAL", "T3FREE", "THYROIDAB" in the last 72 hours.  Invalid input(s): "FREET3" Anemia work up No results for input(s): "VITAMINB12", "FOLATE", "FERRITIN", "TIBC", "IRON", "RETICCTPCT" in the last 72 hours. Urinalysis    Component Value Date/Time   COLORURINE YELLOW 10/19/2022 1955   APPEARANCEUR CLEAR 10/19/2022 1955   LABSPEC 1.026 10/19/2022 1955   PHURINE 5.0 10/19/2022 1955   GLUCOSEU >=500 (A) 10/19/2022 1955   HGBUR SMALL (A) 10/19/2022 1955   BILIRUBINUR NEGATIVE 10/19/2022 1955   KETONESUR 5 (A) 10/19/2022 1955   PROTEINUR 100 (A) 10/19/2022 1955   NITRITE NEGATIVE 10/19/2022 1955   LEUKOCYTESUR NEGATIVE 10/19/2022 1955   Sepsis Labs Recent Labs  Lab 03/12/23 1624 03/14/23 0413  WBC 10.6* 11.8*   Microbiology Recent Results (from the past 240 hour(s))  Culture, blood (Routine x 2)     Status: None (Preliminary result)   Collection Time: 03/12/23  4:24 PM   Specimen: BLOOD  Result Value Ref Range Status   Specimen Description   Final    BLOOD SITE NOT  SPECIFIED Performed at [facility], [address], [place], [place] [address]    Special Requests   Final    BOTTLES DRAWN AEROBIC AND ANAEROBIC Blood Culture adequate volume Performed at [facility], [address], [place], [place] [address]    Culture   Final    NO GROWTH 2 DAYS Performed at [facility], [address], [place], [place] [address]    Report Status PENDING  Incomplete  Culture, blood (Routine x 2)     Status: None (Preliminary result)   Collection Time: 03/12/23  4:30 PM   Specimen: BLOOD  Result Value Ref Range Status   Specimen Description   Final    BLOOD SITE NOT SPECIFIED Performed at [facility], [address] [place], [place] [address]    Special Requests   Final    BOTTLES DRAWN AEROBIC AND ANAEROBIC Blood Culture adequate volume Performed at [facility], [address] [place], [place] [address]    Culture   Final    NO GROWTH 2 DAYS Performed at [facility], [address], [place], [place] [address]    Report Status PENDING  Incomplete  Surgical pcr screen     Status: None   Collection Time: 03/13/23 10:35 AM   Specimen: Nasal Mucosa; Nasal Swab  Result Value Ref Range Status   MRSA, PCR NEGATIVE NEGATIVE Final   Staphylococcus aureus NEGATIVE NEGATIVE  Final    Comment: (NOTE) The Xpert SA Assay (FDA approved for NASAL specimens in patients 30 years of age and older), is one component of a comprehensive surveillance program. It is not intended to diagnose infection nor to guide or monitor treatment. Performed at [facility], [address], [place], [place] [address]   Aerobic/Anaerobic Culture w Gram Stain (surgical/deep wound)     Status: None (Preliminary result)   Collection Time: 03/13/23 11:56 AM   Specimen: Abscess  Result Value Ref Range Status   Specimen Description   Final    ABSCESS LEFT HIP Performed at [facility], [address], [place], [place] [address]    Special Requests   Final    NONE Performed at [facility], [address] [place], [place] [address]    Gram Stain   Final    MODERATE WBC PRESENT, PREDOMINANTLY PMN MODERATE GRAM POSITIVE COCCI FEW GRAM POSITIVE RODS    Culture   Final    NO GROWTH < 24 HOURS Performed at [facility], [address], [place], [place] [address]    Report Status PENDING  Incomplete  Aerobic/Anaerobic Culture w Gram Stain (surgical/deep wound)     Status: None (Preliminary result)   Collection Time: 03/13/23 11:59 AM   Specimen: Abscess  Result Value Ref Range Status   Specimen Description   Final    ABSCESS LEFT HIP TISSUE Performed at [facility], [address] [place], [place] [address]    Special Requests   Final    NONE Performed at [facility], [address], [place], [place] [address]    Gram Stain   Final    FEW WBC PRESENT, PREDOMINANTLY PMN RARE GRAM POSITIVE COCCI IN PAIRS    Culture   Final    CULTURE REINCUBATED FOR BETTER GROWTH Performed at [facility], [address] [place], [place] [address]    Report Status PENDING  Incomplete     Time coordinating discharge: 32 minutes  SIGNED:   Dorcas Carrow, MD  [facility] 03/14/2023, 10:44 AM

## 2023-03-14 NOTE — Plan of Care (Signed)

## 2023-03-14 NOTE — Progress Notes (Signed)
Patient ID: Sean Schaefer, male   DOB: [birth year], 57 y.o.   MRN: [id number] [facility] Surgery Progress Note  1 Day Post-Op  Subjective: CC-  Surgical site sore but pain improved from prior to surgery. Ready for dressing change.  Objective: Vital signs in last 24 hours: Temp:  [97.5 F (36.4 C)-97.9 F (36.6 C)] 97.6 F (36.4 C) (08/23 0952) Pulse Rate:  [76-98] 76 (08/23 0952) Resp:  [12-18] 16 (08/23 0952) BP: (125-173)/(74-96) 141/74 (08/23 0952) SpO2:  [100 %] 100 % (08/23 0952) Last BM Date : 03/12/23  Intake/Output from previous day: 08/22 0701 - 08/23 0700 In: 3752.1 [P.O.:1080; I.V.:2333.8; IV Piggyback:338.4] Out: 860 [Urine:850; Blood:10] Intake/Output this shift: No intake/output data recorded.  PE: Gen:  Alert, NAD, pleasant Groin: left groin wound pictured below with healthy granulation tissue, some areas of cautery, no purulent drainage or significant cellulitis. The wound does tunnel at the 3 and 9 o'clock positions   Lab Results:  Recent Labs    03/12/23 1624 03/14/23 0413  WBC 10.6* 11.8*  HGB 13.4 12.2*  HCT 40.5 38.1*  PLT 170 145*   BMET Recent Labs    03/12/23 1624 03/14/23 0413  NA 130* 131*  K 4.2 4.5  CL 94* 99  CO2 24 21*  GLUCOSE 296* 215*  BUN 18 27*  CREATININE 1.57* 1.62*  CALCIUM 9.4 8.2*   PT/INR No results for input(s): "LABPROT", "INR" in the last 72 hours. CMP     Component Value Date/Time   NA 131 (L) 03/14/2023 0413   NA 135 11/04/2022 0915   K 4.5 03/14/2023 0413   CL 99 03/14/2023 0413   CO2 21 (L) 03/14/2023 0413   GLUCOSE 215 (H) 03/14/2023 0413   BUN 27 (H) 03/14/2023 0413   BUN 21 11/04/2022 0915   CREATININE 1.62 (H) 03/14/2023 0413   CREATININE 1.25 01/08/2016 1611   CALCIUM 8.2 (L) 03/14/2023 0413   PROT 7.8 03/14/2023 0413   PROT 7.5 02/03/2023 0901   ALBUMIN 2.8 (L) 03/14/2023 0413   ALBUMIN 4.0 02/03/2023 0901   AST 15 03/14/2023 0413   ALT 12 03/14/2023 0413   ALKPHOS 58 03/14/2023  0413   BILITOT 1.0 03/14/2023 0413   BILITOT 0.6 02/03/2023 0901   GFRNONAA 50 (L) 03/14/2023 0413   GFRNONAA 68 01/08/2016 1611   GFRAA 105 08/24/2018 1750   GFRAA 79 01/08/2016 1611   Lipase     Component Value Date/Time   LIPASE 40 12/09/2022 1858       Studies/Results: CT ABDOMEN PELVIS W CONTRAST  Result Date: 03/12/2023 CLINICAL DATA:  Crohn's disease with worsening left lower quadrant abdominal pain and drainage. Fever today. Known history of fistulas EXAM: CT ABDOMEN AND PELVIS WITH CONTRAST TECHNIQUE: Multidetector CT imaging of the abdomen and pelvis was performed using the standard protocol following bolus administration of intravenous contrast. RADIATION DOSE REDUCTION: This exam was performed according to the departmental dose-optimization program which includes automated exposure control, adjustment of the mA and/or kV according to patient size and/or use of iterative reconstruction technique. CONTRAST:  OMNIPAQUE IOHEXOL 300 MG/ML  SOLN COMPARISON:  CT 12/20/2022 and older FINDINGS: Lower chest: There is some linear opacity lung bases likely scar or atelectasis. No pleural effusion. Hepatobiliary: No focal liver abnormality is seen. No gallbladder wall thickening, or biliary dilatation. Gallstones. Patent portal vein. Pancreas: Unremarkable. No pancreatic ductal dilatation or surrounding inflammatory changes. Spleen: Normal in size without focal abnormality. Adrenals/Urinary Tract: Adrenal glands are preserved. No enhancing  renal mass or collecting system dilatation. The ureters have normal course and caliber extending down to the bladder preserved contours of the urinary bladder Stomach/Bowel: No oral contrast. The stomach is underdistended. Normal course and caliber of the duodenum. Surgical changes along the bowel with previous colectomy. Anastomosis at the rectosigmoid region with surgical changes. Once again there is some thickening in the presacral space with a small  area of air and fluid posteriorly to the course of the bowel in this location. Please correlate for surgical change or contained leak. Again unchanged. Multiple setons are identified along the anorectal region with soft tissue thickening extending into the left ischioanal fossa and along the left gluteal cleft. The previous fluid collections are no longer identified, more residual soft tissue thickening. Vascular/Lymphatic: Normal caliber aorta and IVC with scattered vascular calcifications. There are some prominent pelvic nodes identified which could be reactive, pelvic sidewall and inguinal regions, left-greater-than-right. Reproductive: Mildly enlarged prostate. Other: No free intra-air. There is inflammatory stranding with soft tissue gas along the subcutaneous fat anterior to the left hip and along the left hemipelvis anterior laterally. This new from the prior CT scan. There is skin thickening and stranding in this location. There is no obvious tract extending into the peritoneal cavity in this location. Please correlate for soft tissue infection or other process. Musculoskeletal: Degenerative changes seen of the spine and pelvis. IMPRESSION: Surgical changes of previous subtotal colectomy with soft tissue thickening in the area of the surgical anastomosis in the low pelvis. There continues to be soft tissue thickening and some air with fluid extending into the presacral space, similar to previous. Multiple setons in place along the anal region consistent with multiple fistula. The fluid collection along the ischioanal fossa and fat deep to the gluteal cleft on the left is improving with significant residual soft tissue thickening and stranding. No soft tissue gas in this location. However there is a new area of stranding, skin thickening with soft tissue gas along the subcutaneous fat anterolateral along the left hemipelvis and anterior to the left hip. Please correlate for etiology including soft tissue  injury or infection. No clear fistula track extends into the peritoneal space from this location. Please correlate with clinical history. Gallstones. Electronically Signed   By: Karen Kays M.D.   On: 03/12/2023 18:11    Anti-infectives: Anti-infectives (From admission, onward)    Start     Dose/Rate Route Frequency Ordered Stop   03/14/23 0000  doxycycline (VIBRAMYCIN) 50 MG capsule        50 mg Oral 2 times daily 03/14/23 1030 03/21/23 2359   03/13/23 0000  ceFEPIme (MAXIPIME) 2 g in sodium chloride 0.9 % 100 mL IVPB        2 g 200 mL/hr over 30 Minutes Intravenous Every 8 hours 03/12/23 2014     03/12/23 1630  ciprofloxacin (CIPRO) IVPB 400 mg  Status:  Discontinued        400 mg 200 mL/hr over 60 Minutes Intravenous  Once 03/12/23 1622 03/12/23 1625   03/12/23 1630  metroNIDAZOLE (FLAGYL) IVPB 500 mg        500 mg 100 mL/hr over 60 Minutes Intravenous Every 12 hours 03/12/23 1622     03/12/23 1630  ceFEPIme (MAXIPIME) 2 g in sodium chloride 0.9 % 100 mL IVPB        2 g 200 mL/hr over 30 Minutes Intravenous  Once 03/12/23 1627 03/12/23 1720        Assessment/Plan Left hip subcutaneous  abscess  -POD#1 s/p Incision and drainage with debridement of a left hip subcutaneous abscess (8 x 3 cm)  8/22 Dr. Corliss Skains - gram stain GPCs, culture pending - Wound is clean and patient tolerated dressing change well. He and his wife feel comfortable with continuing dressing changes at home. Ok for discharge today from surgical standpoint. Discharge instructions and follow up info on AVS. I will send rx's for doxycycline and pain medication to his pharmacy. This has been communicated with the primary team.    LOS: 2 days    Franne Forts, [facility] Surgery 03/14/2023, 10:30 AM Please see Amion for pager number during day hours 7:00am-4:30pm

## 2023-03-15 ENCOUNTER — Other Ambulatory Visit (HOSPITAL_COMMUNITY): Payer: Self-pay

## 2023-03-17 ENCOUNTER — Ambulatory Visit (INDEPENDENT_AMBULATORY_CARE_PROVIDER_SITE_OTHER): Payer: 59

## 2023-03-17 VITALS — BP 177/102 | HR 75 | Temp 98.0°F | Resp 16 | Ht 70.0 in | Wt 205.2 lb

## 2023-03-17 DIAGNOSIS — K50114 Crohn's disease of large intestine with abscess: Secondary | ICD-10-CM | POA: Diagnosis not present

## 2023-03-17 LAB — CULTURE, BLOOD (ROUTINE X 2)
Culture: NO GROWTH
Culture: NO GROWTH
Special Requests: ADEQUATE
Special Requests: ADEQUATE

## 2023-03-17 MED ORDER — RISANKIZUMAB-RZAA 600 MG/10ML IV SOLN
600.0000 mg | Freq: Once | INTRAVENOUS | Status: AC
  Administered 2023-03-17: 600 mg via INTRAVENOUS
  Filled 2023-03-17: qty 0

## 2023-03-17 NOTE — Progress Notes (Signed)
Diagnosis: Crohn's Disease  Provider:  Chilton Greathouse MD  Procedure: IV Infusion  IV Type: Peripheral, IV Location: R Hand  Skyrizi (risankizumab-rzaa), Dose: 600 mg  Infusion Start Time: 0906  Infusion Stop Time: 1011  Post Infusion IV Care: Peripheral IV Discontinued  Discharge: Condition: Good, Destination: Home . AVS Declined  Performed by:  Loney Hering, LPN

## 2023-03-18 LAB — AEROBIC/ANAEROBIC CULTURE W GRAM STAIN (SURGICAL/DEEP WOUND)

## 2023-03-21 ENCOUNTER — Other Ambulatory Visit (HOSPITAL_COMMUNITY): Payer: Self-pay

## 2023-03-21 DIAGNOSIS — L02416 Cutaneous abscess of left lower limb: Secondary | ICD-10-CM | POA: Diagnosis not present

## 2023-03-21 DIAGNOSIS — Z9889 Other specified postprocedural states: Secondary | ICD-10-CM | POA: Diagnosis not present

## 2023-03-21 DIAGNOSIS — K610 Anal abscess: Secondary | ICD-10-CM | POA: Diagnosis not present

## 2023-03-21 MED ORDER — METHOCARBAMOL 500 MG PO TABS
500.0000 mg | ORAL_TABLET | Freq: Four times a day (QID) | ORAL | 0 refills | Status: DC | PRN
  Filled 2023-03-21: qty 40, 10d supply, fill #0

## 2023-03-25 ENCOUNTER — Other Ambulatory Visit (HOSPITAL_COMMUNITY): Payer: Self-pay

## 2023-03-25 ENCOUNTER — Other Ambulatory Visit: Payer: Self-pay | Admitting: Nurse Practitioner

## 2023-03-25 MED ORDER — CARVEDILOL 12.5 MG PO TABS
12.5000 mg | ORAL_TABLET | Freq: Two times a day (BID) | ORAL | 1 refills | Status: DC
  Filled 2023-03-25: qty 180, 90d supply, fill #0
  Filled 2023-08-07: qty 180, 90d supply, fill #1

## 2023-03-26 ENCOUNTER — Other Ambulatory Visit (HOSPITAL_COMMUNITY): Payer: Self-pay

## 2023-03-27 ENCOUNTER — Other Ambulatory Visit (HOSPITAL_COMMUNITY): Payer: Self-pay

## 2023-03-27 DIAGNOSIS — Z794 Long term (current) use of insulin: Secondary | ICD-10-CM | POA: Diagnosis not present

## 2023-03-27 DIAGNOSIS — L0291 Cutaneous abscess, unspecified: Secondary | ICD-10-CM | POA: Diagnosis not present

## 2023-03-27 DIAGNOSIS — E1169 Type 2 diabetes mellitus with other specified complication: Secondary | ICD-10-CM | POA: Diagnosis not present

## 2023-03-27 DIAGNOSIS — K5090 Crohn's disease, unspecified, without complications: Secondary | ICD-10-CM | POA: Diagnosis not present

## 2023-03-28 ENCOUNTER — Other Ambulatory Visit (HOSPITAL_COMMUNITY): Payer: Self-pay

## 2023-04-01 ENCOUNTER — Other Ambulatory Visit (HOSPITAL_COMMUNITY): Payer: Self-pay

## 2023-04-01 ENCOUNTER — Telehealth: Payer: Self-pay

## 2023-04-01 ENCOUNTER — Other Ambulatory Visit: Payer: Self-pay

## 2023-04-01 NOTE — Telephone Encounter (Signed)
Received a call from Lawson Fiscal with [facility] specialty pharmacy regarding [facility] OBI prescription. We confirmed that patient will be due for first maintenance dose 4 weeks after his last infusion, due on 04/14/23 then every 8 weeks thereafter. Lawson Fiscal wanted to know if we did injection/OBI training and I informed her that we do not. I advised that I will enroll patient in Skyrizi complete and he will be assigned a nurse ambassador that will guide him on how to use OBI system. Lawson Fiscal will notify patient to expect a call from nurse to review this information with him. Lawson Fiscal will then contact patient to set up shipment of Skyrizi 180 mg maintenance dose closer to his due date.   Skyrizi complete form printed, completed, and faxed back to [facility] complete at [phone].

## 2023-04-07 ENCOUNTER — Other Ambulatory Visit (HOSPITAL_COMMUNITY): Payer: Self-pay

## 2023-04-08 ENCOUNTER — Other Ambulatory Visit (HOSPITAL_COMMUNITY): Payer: Self-pay

## 2023-04-08 MED ORDER — SODIUM CHLORIDE 0.9 % IR SOLN
1 refills | Status: DC
  Filled 2023-04-08: qty 500, 10d supply, fill #0

## 2023-04-10 ENCOUNTER — Other Ambulatory Visit (HOSPITAL_COMMUNITY): Payer: Self-pay

## 2023-05-06 DIAGNOSIS — Z9889 Other specified postprocedural states: Secondary | ICD-10-CM | POA: Diagnosis not present

## 2023-05-06 DIAGNOSIS — L02416 Cutaneous abscess of left lower limb: Secondary | ICD-10-CM | POA: Diagnosis not present

## 2023-05-07 ENCOUNTER — Other Ambulatory Visit (HOSPITAL_COMMUNITY): Payer: Self-pay

## 2023-05-27 ENCOUNTER — Other Ambulatory Visit (HOSPITAL_COMMUNITY): Payer: Self-pay

## 2023-05-28 ENCOUNTER — Other Ambulatory Visit (HOSPITAL_COMMUNITY): Payer: Self-pay | Admitting: Pharmacy Technician

## 2023-05-28 ENCOUNTER — Ambulatory Visit (INDEPENDENT_AMBULATORY_CARE_PROVIDER_SITE_OTHER): Payer: 59 | Admitting: Gastroenterology

## 2023-05-28 ENCOUNTER — Encounter: Payer: Self-pay | Admitting: Gastroenterology

## 2023-05-28 ENCOUNTER — Other Ambulatory Visit (HOSPITAL_COMMUNITY): Payer: Self-pay

## 2023-05-28 VITALS — BP 100/70 | HR 105 | Ht 70.0 in | Wt 191.8 lb

## 2023-05-28 DIAGNOSIS — K6289 Other specified diseases of anus and rectum: Secondary | ICD-10-CM | POA: Diagnosis not present

## 2023-05-28 DIAGNOSIS — K50114 Crohn's disease of large intestine with abscess: Secondary | ICD-10-CM | POA: Diagnosis not present

## 2023-05-28 DIAGNOSIS — K611 Rectal abscess: Secondary | ICD-10-CM

## 2023-05-28 MED ORDER — CIPROFLOXACIN HCL 500 MG PO TABS
500.0000 mg | ORAL_TABLET | Freq: Two times a day (BID) | ORAL | 0 refills | Status: AC
  Filled 2023-05-28: qty 20, 10d supply, fill #0

## 2023-05-28 MED ORDER — METRONIDAZOLE 500 MG PO TABS
500.0000 mg | ORAL_TABLET | Freq: Two times a day (BID) | ORAL | 0 refills | Status: DC
  Filled 2023-05-28: qty 20, 10d supply, fill #0

## 2023-05-28 MED ORDER — OXYCODONE HCL 5 MG PO TABS
5.0000 mg | ORAL_TABLET | Freq: Four times a day (QID) | ORAL | 0 refills | Status: DC | PRN
  Filled 2023-05-28: qty 20, 5d supply, fill #0

## 2023-05-28 NOTE — Patient Instructions (Addendum)
We have sent a referral  to [facility]- they will give you a call to get scheduled.    _______________________________________________________  If your blood pressure at your visit was 140/90 or greater, please contact your primary care physician to follow up on this.  _______________________________________________________  If you are age 55 or older, your body mass index should be between 23-30. Your Body mass index is 27.52 kg/m. If this is out of the aforementioned range listed, please consider follow up with your Primary Care Provider.  If you are age 43 or younger, your body mass index should be between 19-25. Your Body mass index is 27.52 kg/m. If this is out of the aformentioned range listed, please consider follow up with your Primary Care Provider.   ________________________________________________________  The [place] [facility] providers would like to encourage you to use [facility] to communicate with providers for non-urgent requests or questions.  Due to long hold times on the telephone, sending your provider a message by [facility] may be a faster and more efficient way to get a response.  Please allow 48 business hours for a response.  Please remember that this is for non-urgent requests.  _______________________________________________________ It was a pleasure to see you today!  Thank you for trusting me with your gastrointestinal care!

## 2023-05-28 NOTE — Progress Notes (Signed)
Specialty Pharmacy Refill Coordination Note  Sean Schaefer is a 57 y.o. male contacted today regarding refills of specialty medication(s) Risankizumab-Rzaa (Inflammatory Bowel Agents)   Patient requested Pickup at [facility] at [facility] date: 05/29/23   Medication will be filled on 05/28/23.

## 2023-05-28 NOTE — Progress Notes (Signed)
[facility] Progress Note  Chief Complaint: Fistulizing Crohn's disease with pouchitis  Subjective  Prior history  Complex history outlined in 02/11/2023 office note including the following: "Sean Schaefer follows up for his Crohn's.  Clinical details in multiple prior notes, incl 12/20/22 at this office.  Was hospitalized that day and required another I&D and Abx for buttock abscess.  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  Dr. Cliffton Asters has previously offered permanent ileostomy, which Sean Schaefer has declined.  On 5 mg/kg infliximab (see prior notes - 10 mg/kg caused altered vision s.e.) and AZA.  Level nml, neg Ab, indicating mechanistic med failure.  Changed to [employer] - first dose 600 mg on 01/20/23, third induction dose on 03/17/2023 Referred to Dr. Britt Bolognese at [facility]   He was ultimately unable to attend the appointment with Dr. Gwenith Spitz due to cost because that institution is out of network for his insurance.  He also had reflux esophagitis during a May 2024 hospitalization treated with twice daily PPI, then de-escalated to once daily.  Sean Schaefer was then hospitalized on 03/12/2023 with soft tissue infection and abscess in the left hip, source having been chronic fistulizing disease tracking through the soft tissue to that area.  He required surgical incision and drainage and had follow-up in the surgical clinic afterward and required office-based I&D of an 8 cm persistent left hip abscess.  Most recently 05/06/2023, at which time the wound had almost completely healed.  Discussed the use of AI scribe software for clinical note transcription with the patient, who gave verbal consent to proceed.  History of Present Illness   The patient, with a history of Crohn's disease, was hospitalized for a large abscess around the left hip. The abscess required surgical intervention for drainage and subsequent follow-up visits for further drainage of fluid and pus. The wound took some time to  heal. Recently, the patient noticed a new issue in the perianal area, which was initially doing well post-surgery. A few days ago, the patient noticed swelling in the area and experienced an episode of spontaneous drainage of blood and pus.  Perianal pain has been escalating in the last couple of days.  The patient has been managing the issue at home with sitz baths with little relief.  The symptoms feel to him like prior abscesses.  The patient has been on Skyrizi for Crohn's disease since early July, and is about to take the second maintenance dose. The patient's disease has been severe, and while there is hope that Cristy Folks will provide better control, the patient continues to experience complications, including the recent perianal abscess.        ROS: Cardiovascular:  no chest pain Respiratory: no dyspnea Denies fever or chills Remainder systems negative except as above The patient's Past Medical, Family and Social History were reviewed and are on file in the EMR. Past Medical History:  Diagnosis Date   CAD (coronary artery disease)    Crohn's disease (HCC)    Diabetes mellitus (HCC)    Dilation of aorta (HCC)    Hypertension    Ulcerative colitis (HCC)     Past Surgical History:  Procedure Laterality Date   BIOPSY  12/12/2022   Procedure: BIOPSY;  Surgeon: Benancio Deeds, MD;  Location: [facility] ENDOSCOPY;  Service: Gastroenterology;;   COLON SURGERY     COLONOSCOPY     CORONARY BALLOON ANGIOPLASTY N/A 10/20/2022   Procedure: CORONARY BALLOON ANGIOPLASTY;  Surgeon: Marykay Lex, MD;  Location:  [facility] INVASIVE CV LAB;  Service: Cardiovascular;  Laterality: N/A;   CORONARY STENT INTERVENTION N/A 10/20/2022   Procedure: CORONARY STENT INTERVENTION;  Surgeon: Marykay Lex, MD;  Location: [facility] INVASIVE CV LAB;  Service: Cardiovascular;  Laterality: N/A;   ESOPHAGOGASTRODUODENOSCOPY (EGD) WITH PROPOFOL N/A 12/12/2022   Procedure: ESOPHAGOGASTRODUODENOSCOPY (EGD) WITH PROPOFOL;  Surgeon:  Benancio Deeds, MD;  Location: [facility] ENDOSCOPY;  Service: Gastroenterology;  Laterality: N/A;   EYE SURGERY Left    FLEXIBLE SIGMOIDOSCOPY N/A 02/05/2021   Procedure: FLEXIBLE POUCHOSCOPY WITH BIOPSY;  Surgeon: Andria Meuse, MD;  Location: [facility] ORS;  Service: General;  Laterality: N/A;   INCISION AND DRAINAGE ABSCESS N/A 02/05/2021   Procedure: INCISION AND DRAINAGE OF PERIANAL ABSCESS;  Surgeon: Andria Meuse, MD;  Location: [facility] ORS;  Service: General;  Laterality: N/A;   INCISION AND DRAINAGE ABSCESS N/A 12/21/2022   Procedure: INCISION AND DRAINAGE ABSCESS;  Surgeon: Berna Bue, MD;  Location: [facility] OR;  Service: General;  Laterality: N/A;   INCISION AND DRAINAGE ABSCESS Left 03/13/2023   Procedure: INCISION AND DRAINAGE ABSCESS;  Surgeon: Manus Rudd, MD;  Location: [facility] ORS;  Service: General;  Laterality: Left;   INCISION AND DRAINAGE PERIRECTAL ABSCESS N/A 11/30/2020   Procedure: IRRIGATION AND DEBRIDEMENT PERIRECTAL ABSCESS;  Surgeon: Fritzi Mandes, MD;  Location: [facility] OR;  Service: General;  Laterality: N/A;   INCISION AND DRAINAGE PERIRECTAL ABSCESS Left 04/03/2021   Procedure: left peri-rectal/gluteal abscess;  Surgeon: Diamantina Monks, MD;  Location: [facility] OR;  Service: General;  Laterality: Left;   LEFT HEART CATH AND CORONARY ANGIOGRAPHY N/A 10/20/2022   Procedure: LEFT HEART CATH AND CORONARY ANGIOGRAPHY;  Surgeon: Marykay Lex, MD;  Location: [facility] INVASIVE CV LAB;  Service: Cardiovascular;  Laterality: N/A;   Perianal fistula repair  07/22/2010   PLACEMENT OF SETON N/A 02/05/2021   Procedure: PLACEMENT OF SETONS X2;  Surgeon: Andria Meuse, MD;  Location: [facility] ORS;  Service: General;  Laterality: N/A;   RECTAL EXAM UNDER ANESTHESIA N/A 02/05/2021   Procedure: ANORECTAL EXAM UNDER ANESTHESIA;  Surgeon: Andria Meuse, MD;  Location: [facility] ORS;  Service: General;  Laterality: N/A;   TESTICLE REMOVAL Left 2000     Objective:  Med list  reviewed  Current Outpatient Medications:    aspirin EC 81 MG tablet, Take 1 tablet (81 mg total) by mouth daily. Swallow whole. (Patient taking differently: Take 81 mg by mouth in the morning. Swallow whole.), Disp: 30 tablet, Rfl: 1   carvedilol (COREG) 12.5 MG tablet, Take 6.25 mg ( 1/2 tablet of 12.5 mg)  in the morning and 12.5 mg  in the evening (Patient taking differently: Take 6.25-12.5 mg by mouth See admin instructions. Take 6.25 mg by mouth in the morning and 12.5 mg at bedtime), Disp: 180 tablet, Rfl: 1   carvedilol (COREG) 12.5 MG tablet, Take 1 tablet (12.5 mg total) by mouth 2 (two) times daily with a meal., Disp: 180 tablet, Rfl: 1   ciprofloxacin (CIPRO) 500 MG tablet, Take 1 tablet (500 mg total) by mouth 2 (two) times daily for 10 days., Disp: 20 tablet, Rfl: 0   Continuous Glucose Sensor (FREESTYLE LIBRE 14 DAY SENSOR) MISC, Apply 1 sensor to the back of upper arm every 14 days, Disp: 2 each, Rfl: 3   insulin degludec (TRESIBA FLEXTOUCH) 100 UNIT/ML FlexTouch Pen, Inject 38 Units into the skin daily. (Patient taking differently: Inject 38 Units into the skin at bedtime.), Disp: , Rfl:  insulin lispro (HUMALOG) 100 UNIT/ML injection, Inject 8 Units into the skin 3 (three) times daily with meals., Disp: , Rfl:    methocarbamol (ROBAXIN) 500 MG tablet, Take 1 tablet (500 mg total) by mouth every 6 (six) hours as needed., Disp: 40 tablet, Rfl: 0   metroNIDAZOLE (FLAGYL) 500 MG tablet, Take 1 tablet (500 mg total) by mouth 2 (two) times daily., Disp: 20 tablet, Rfl: 0   ondansetron (ZOFRAN-ODT) 4 MG disintegrating tablet, Dissolve 1 tablet (4 mg total) by mouth every 8 (eight) hours as needed for nausea or vomiting., Disp: 20 tablet, Rfl: 0   oxyCODONE (OXY IR/ROXICODONE) 5 MG immediate release tablet, Take 1 tablet (5 mg total) by mouth every 6 (six) hours as needed for severe pain., Disp: 15 tablet, Rfl: 0   Risankizumab-rzaa (SKYRIZI) 180 MG/1.2ML SOCT, Inject 1.2 mLs into the skin  every 8 (eight) weeks., Disp: 1.2 mL, Rfl: 5   rosuvastatin (CRESTOR) 40 MG tablet, Take 1 tablet (40 mg total) by mouth daily., Disp: 90 tablet, Rfl: 3   SKYRIZI 600 MG/10ML injection, Inject 600 mg into the vein See admin instructions. Inject 600 mg administered by intravenous infusion over a period of at least one hour at Week 0, Week 4, and Week 8, Disp: , Rfl:    sodium chloride irrigation 0.9 % irrigation, Use as directed by MD daily, Disp: 500 mL, Rfl: 1   ticagrelor (BRILINTA) 90 MG TABS tablet, Take 1 tablet (90 mg) by mouth 2 times daily. (Patient taking differently: Take 90 mg by mouth in the morning and at bedtime.), Disp: 180 tablet, Rfl: 1   nitroGLYCERIN (NITROSTAT) 0.4 MG SL tablet, Place 1 tablet (0.4 mg total) under the tongue every 5 (five) minutes as needed for chest pain., Disp: 45 tablet, Rfl: 3   pantoprazole (PROTONIX) 40 MG tablet, Take 1 tablet (40 mg total) by mouth 2 (two) times daily. (Patient not taking: Reported on 03/12/2023), Disp: 60 tablet, Rfl: 11  Current Facility-Administered Medications:    Insulin Pen Needle (NOVOFINE) 10 each, 1 packet, Subcutaneous, QHS, Sean Neas, PA-C   Vital signs in last 24 hrs: Vitals:   05/28/23 1313  BP: 100/70  Pulse: (!) 105   Wt Readings from Last 3 Encounters:  05/28/23 191 lb 12.8 oz (87 kg)  03/17/23 205 lb 3.2 oz (93.1 kg)  03/12/23 207 lb (93.9 kg)    Physical Exam Wife present for entire visit He is not acutely ill-appearing, though he is uncomfortable when sitting due to the perianal pain HEENT: sclera anicteric, oral mucosa moist without lesions Neck: supple, no thyromegaly, JVD or lymphadenopathy Cardiac: Regular without appreciable murmur,  no peripheral edema Pulm: clear to auscultation bilaterally, normal RR and effort noted Abdomen: soft, no tenderness, with active bowel sounds. No guarding or palpable hepatosplenomegaly.  Multiple surgical scars.  The most recent scar toward the left hip/inguinal  region is well-healed. Skin; warm and dry, no jaundice or rash  Perianal exam.  2 setons in place as before.  He has tenderness and firm fibrotic skin changes in the perineum below the scrotum, which is the area of one of the seton openings.  No bloody or purulent drainage from that opening. There is also no drainage from the second seton/fistulous opening on the left buttock, but adjacent to that is a several centimeter area of tenderness and erythema.  There is approximately 1 cm skin opening that he says was the site of bloody and purulent drainage 2 nights ago.  Nothing is expressed from that today with palpation.  Labs:   ___________________________________________ Radiologic studies: CLINICAL DATA:  Crohn's disease with worsening left lower quadrant abdominal pain and drainage. Fever today. Known history of fistulas   EXAM: CT ABDOMEN AND PELVIS WITH CONTRAST   TECHNIQUE: Multidetector CT imaging of the abdomen and pelvis was performed using the standard protocol following bolus administration of intravenous contrast.   RADIATION DOSE REDUCTION: This exam was performed according to the departmental dose-optimization program which includes automated exposure control, adjustment of the mA and/or kV according to patient size and/or use of iterative reconstruction technique.   CONTRAST:  OMNIPAQUE IOHEXOL 300 MG/ML  SOLN   COMPARISON:  CT 12/20/2022 and older   FINDINGS: Lower chest: There is some linear opacity lung bases likely scar or atelectasis. No pleural effusion.   Hepatobiliary: No focal liver abnormality is seen. No gallbladder wall thickening, or biliary dilatation. Gallstones. Patent portal vein.   Pancreas: Unremarkable. No pancreatic ductal dilatation or surrounding inflammatory changes.   Spleen: Normal in size without focal abnormality.   Adrenals/Urinary Tract: Adrenal glands are preserved. No enhancing renal mass or collecting system dilatation.  The ureters have normal course and caliber extending down to the bladder preserved contours of the urinary bladder   Stomach/Bowel: No oral contrast. The stomach is underdistended. Normal course and caliber of the duodenum. Surgical changes along the bowel with previous colectomy. Anastomosis at the rectosigmoid region with surgical changes. Once again there is some thickening in the presacral space with a small area of air and fluid posteriorly to the course of the bowel in this location. Please correlate for surgical change or contained leak. Again unchanged. Multiple setons are identified along the anorectal region with soft tissue thickening extending into the left ischioanal fossa and along the left gluteal cleft. The previous fluid collections are no longer identified, more residual soft tissue thickening.   Vascular/Lymphatic: Normal caliber aorta and IVC with scattered vascular calcifications. There are some prominent pelvic nodes identified which could be reactive, pelvic sidewall and inguinal regions, left-greater-than-right.   Reproductive: Mildly enlarged prostate.   Other: No free intra-air.   There is inflammatory stranding with soft tissue gas along the subcutaneous fat anterior to the left hip and along the left hemipelvis anterior laterally. This new from the prior CT scan. There is skin thickening and stranding in this location. There is no obvious tract extending into the peritoneal cavity in this location. Please correlate for soft tissue infection or other process.   Musculoskeletal: Degenerative changes seen of the spine and pelvis.   IMPRESSION: Surgical changes of previous subtotal colectomy with soft tissue thickening in the area of the surgical anastomosis in the low pelvis. There continues to be soft tissue thickening and some air with fluid extending into the presacral space, similar to previous.   Multiple setons in place along the anal region  consistent with multiple fistula. The fluid collection along the ischioanal fossa and fat deep to the gluteal cleft on the left is improving with significant residual soft tissue thickening and stranding. No soft tissue gas in this location.   However there is a new area of stranding, skin thickening with soft tissue gas along the subcutaneous fat anterolateral along the left hemipelvis and anterior to the left hip. Please correlate for etiology including soft tissue injury or infection. No clear fistula track extends into the peritoneal space from this location. Please correlate with clinical history.   Gallstones.     Electronically  Signed   By: Karen Kays M.D.   On: 03/12/2023 18:11    ____________________________________________ Other:   _____________________________________________ Assessment & Plan  Assessment: Encounter Diagnoses  Name Primary?   Crohn's disease of perianal region with abscess (HCC) Yes   Abscess, perirectal    Perianal pain     Assessment and Plan    Perianal Abscess Recurrent abscess in the setting of Crohn's disease. Recent spontaneous drainage with some relief of symptoms. Likely ongoing infection. -Start Ciprofloxacin and Flagyl. -Refer to surgical practice for evaluation and possible drainage. Not clear if there is anything amenable to incision and drainage, but he certainly needs surgical reevaluation.  Our office is called over to [facility] surgery to help expedite a visit.  Crohn's Disease Severe disease with recurrent perianal abscesses despite Skyrizi therapy started in July 2024. Unclear response to Skyrizi at this time.  He will take his second maintenance dose within the next couple of days, which will be about 10 days before it is due.  Not clear if he will have improved control of the fistulizing perianal disease within the next few months or if a change in treatment will be needed.  I am hoping that with further time on  maintenance therapy that this condition may get under better control.  Consideration of at least temporary ileostomy to divert fecal stream and perhaps get better control of this condition may yet be warranted if not a permanent ileostomy. -Continue Skyrizi. -Plan to consult with incoming Inflammatory Bowel Disease specialist for further management recommendations.  An IBD specialist will be joining our practice soon, and I hope to be able to discuss his case with them in advance of their arrival in January, and then also get him on that physician schedule for consultation shortly after they arrive.  Prescription for Percocet given today.     35 minutes were spent on this encounter (including chart review, history/exam, counseling/coordination of care, and documentation) > 50% of that time was spent on counseling and coordination of care.    Sean Schaefer

## 2023-05-29 ENCOUNTER — Other Ambulatory Visit (HOSPITAL_COMMUNITY): Payer: Self-pay

## 2023-05-29 DIAGNOSIS — K611 Rectal abscess: Secondary | ICD-10-CM | POA: Diagnosis not present

## 2023-05-29 DIAGNOSIS — K5000 Crohn's disease of small intestine without complications: Secondary | ICD-10-CM | POA: Diagnosis not present

## 2023-05-30 ENCOUNTER — Other Ambulatory Visit (HOSPITAL_COMMUNITY): Payer: Self-pay

## 2023-06-17 ENCOUNTER — Ambulatory Visit: Payer: Self-pay | Admitting: Surgery

## 2023-06-17 NOTE — Progress Notes (Signed)
Sent message, via epic in basket, requesting orders in epic from surgeon.  

## 2023-06-18 NOTE — Patient Instructions (Addendum)
DUE TO COVID-19 ONLY TWO VISITORS  (aged 55 and older)  ARE ALLOWED TO COME WITH YOU AND STAY IN THE WAITING ROOM ONLY DURING PRE OP AND PROCEDURE.   **NO VISITORS ARE ALLOWED IN THE SHORT STAY AREA OR RECOVERY ROOM!!**  IF YOU WILL BE ADMITTED INTO THE HOSPITAL YOU ARE ALLOWED ONLY FOUR SUPPORT PEOPLE DURING VISITATION HOURS ONLY (7 AM -8PM)   The support person(s) must pass our screening, gel in and out, and wear a mask at all times, including in the patient's room. Patients must also wear a mask when staff or their support person are in the room. Visitors GUEST BADGE MUST BE WORN VISIBLY  One adult visitor may remain with you overnight and MUST be in the room by 8 P.M.     Your procedure is scheduled on: 07/03/23   Report to [facility] Main Entrance    Report to admitting at : 1:45 PM   Call this number if you have problems the morning of surgery [phone]   Do not eat food :After Midnight.   After Midnight you may have the following liquids until : 1:00 PM DAY OF SURGERY  Water Black Coffee (sugar ok, NO MILK/CREAM OR CREAMERS)  Tea (sugar ok, NO MILK/CREAM OR CREAMERS) regular and decaf                             Plain Jell-O (NO RED)                                           Fruit ices (not with fruit pulp, NO RED)                                     Popsicles (NO RED)                                                                  Juice: apple, WHITE grape, WHITE cranberry Sports drinks like Gatorade (NO RED)  FOLLOW BOWEL PREP AND ANY ADDITIONAL PRE OP INSTRUCTIONS YOU RECEIVED FROM YOUR SURGEON'S OFFICE!!!   Oral Hygiene is also important to reduce your risk of infection.                                    Remember - BRUSH YOUR TEETH THE MORNING OF SURGERY WITH YOUR REGULAR TOOTHPASTE  DENTURES WILL BE REMOVED PRIOR TO SURGERY PLEASE DO NOT APPLY "Poly grip" OR ADHESIVES!!!   Do NOT smoke after Midnight   Take these medicines the morning of surgery with  A SIP OF WATER: carvedilol,flagyl.  How to Manage Your Diabetes Before and After Surgery  Why is it important to control my blood sugar before and after surgery? Improving blood sugar levels before and after surgery helps healing and can limit problems. A way of improving blood sugar control is eating a healthy diet by:  Eating less sugar and carbohydrates  Increasing activity/exercise  Talking with your  doctor about reaching your blood sugar goals High blood sugars (greater than 180 mg/dL) can raise your risk of infections and slow your recovery, so you will need to focus on controlling your diabetes during the weeks before surgery. Make sure that the doctor who takes care of your diabetes knows about your planned surgery including the date and location.  How do I manage my blood sugar before surgery? Check your blood sugar at least 4 times a day, starting 2 days before surgery, to make sure that the level is not too high or low. Check your blood sugar the morning of your surgery when you wake up and every 2 hours until you get to the Short Stay unit. If your blood sugar is less than 70 mg/dL, you will need to treat for low blood sugar: Do not take insulin. Treat a low blood sugar (less than 70 mg/dL) with  cup of clear juice (cranberry or apple), 4 glucose tablets, OR glucose gel. Recheck blood sugar in 15 minutes after treatment (to make sure it is greater than 70 mg/dL). If your blood sugar is not greater than 70 mg/dL on recheck, call [phone] for further instructions. Report your blood sugar to the short stay nurse when you get to Short Stay.  If you are admitted to the hospital after surgery: Your blood sugar will be checked by the staff and you will probably be given insulin after surgery (instead of oral diabetes medicines) to make sure you have good blood sugar levels. The goal for blood sugar control after surgery is 80-180 mg/dL.   WHAT DO I DO ABOUT MY DIABETES  MEDICATION?  Do not take oral diabetes medicines (pills) the morning of surgery.  THE NIGHT BEFORE SURGERY, take Only 15 units of tresiba  insulin.      THE MORNING OF SURGERY DO NOT TAKE ANY ORAL DIABETIC MEDICATIONS DAY OF YOUR SURGERY.  If your CBG is greater than 220 mg/dL, you may take  of your sliding scale  (correction) dose of insulin.                       These are anesthesia recommendations for holding your anticoagulants.  Please contact your prescribing physician to confirm IF it is safe to hold your anticoagulants for this length of time.   Eliquis Apixaban   72 hours   Xarelto Rivaroxaban   72 hours  Plavix Clopidogrel   120 hours  Pletal Cilostazol   120 hours   You may not have any metal on your body including hair pins, jewelry, and body piercing             Do not wear lotions, powders, perfumes/cologne, or deodorant              Men may shave face and neck.   Do not bring valuables to the hospital. [facility] IS NOT             RESPONSIBLE   FOR VALUABLES.   Contacts, glasses, or bridgework may not be worn into surgery.   Bring small overnight bag day of surgery.   DO NOT BRING YOUR HOME MEDICATIONS TO THE HOSPITAL. PHARMACY WILL DISPENSE MEDICATIONS LISTED ON YOUR MEDICATION LIST TO YOU DURING YOUR ADMISSION IN THE HOSPITAL!    Patients discharged on the day of surgery will not be allowed to drive home.  Someone NEEDS to stay with you for the first 24 hours after anesthesia.   Special Instructions:  Bring a copy of your healthcare power of attorney and living will documents         the day of surgery if you haven't scanned them before.              Please read over the following fact sheets you were given: IF YOU HAVE QUESTIONS ABOUT YOUR PRE-OP INSTRUCTIONS PLEASE CALL [phone]    [facility] - Preparing for Surgery Before surgery, you can play an important role.  Because skin is not sterile, your skin needs to be as free of germs as possible.  You  can reduce the number of germs on your skin by washing with CHG (chlorahexidine gluconate) soap before surgery.  CHG is an antiseptic cleaner which kills germs and bonds with the skin to continue killing germs even after washing. Please DO NOT use if you have an allergy to CHG or antibacterial soaps.  If your skin becomes reddened/irritated stop using the CHG and inform your nurse when you arrive at Short Stay. Do not shave (including legs and underarms) for at least 48 hours prior to the first CHG shower.  You may shave your face/neck. Please follow these instructions carefully:  1.  Shower with CHG Soap the night before surgery and the  morning of Surgery.  2.  If you choose to wash your hair, wash your hair first as usual with your  normal  shampoo.  3.  After you shampoo, rinse your hair and body thoroughly to remove the  shampoo.                           4.  Use CHG as you would any other liquid soap.  You can apply chg directly  to the skin and wash                       Gently with a scrungie or clean washcloth.  5.  Apply the CHG Soap to your body ONLY FROM THE NECK DOWN.   Do not use on face/ open                           Wound or open sores. Avoid contact with eyes, ears mouth and genitals (private parts).                       Wash face,  Genitals (private parts) with your normal soap.             6.  Wash thoroughly, paying special attention to the area where your surgery  will be performed.  7.  Thoroughly rinse your body with warm water from the neck down.  8.  DO NOT shower/wash with your normal soap after using and rinsing off  the CHG Soap.                9.  Pat yourself dry with a clean towel.            10.  Wear clean pajamas.            11.  Place clean sheets on your bed the night of your first shower and do not  sleep with pets. Day of Surgery : Do not apply any lotions/deodorants the morning of surgery.  Please wear clean clothes to the hospital/surgery center.  FAILURE  TO FOLLOW THESE INSTRUCTIONS MAY RESULT IN THE  CANCELLATION OF YOUR SURGERY PATIENT SIGNATURE_________________________________  NURSE SIGNATURE__________________________________  ________________________________________________________________________

## 2023-06-23 ENCOUNTER — Other Ambulatory Visit: Payer: Self-pay | Admitting: Nurse Practitioner

## 2023-06-24 ENCOUNTER — Other Ambulatory Visit (HOSPITAL_COMMUNITY): Payer: Self-pay

## 2023-06-24 MED ORDER — TICAGRELOR 90 MG PO TABS
90.0000 mg | ORAL_TABLET | Freq: Two times a day (BID) | ORAL | 1 refills | Status: DC
  Filled 2023-06-24: qty 180, 90d supply, fill #0
  Filled 2023-10-13: qty 180, 90d supply, fill #1

## 2023-06-26 ENCOUNTER — Encounter (HOSPITAL_COMMUNITY)
Admission: RE | Admit: 2023-06-26 | Discharge: 2023-06-26 | Disposition: A | Discharge status: Home / Self Care | Payer: 59 | Source: Ambulatory Visit | Attending: Surgery | Admitting: Surgery

## 2023-06-26 ENCOUNTER — Other Ambulatory Visit: Payer: Self-pay

## 2023-06-26 ENCOUNTER — Encounter (HOSPITAL_COMMUNITY): Payer: Self-pay

## 2023-06-26 VITALS — BP 139/96 | HR 83 | Temp 97.8°F | Ht 70.0 in | Wt 191.0 lb

## 2023-06-26 DIAGNOSIS — Z01812 Encounter for preprocedural laboratory examination: Secondary | ICD-10-CM | POA: Diagnosis not present

## 2023-06-26 DIAGNOSIS — Z794 Long term (current) use of insulin: Secondary | ICD-10-CM | POA: Diagnosis not present

## 2023-06-26 DIAGNOSIS — I214 Non-ST elevation (NSTEMI) myocardial infarction: Secondary | ICD-10-CM

## 2023-06-26 DIAGNOSIS — I25110 Atherosclerotic heart disease of native coronary artery with unstable angina pectoris: Secondary | ICD-10-CM | POA: Diagnosis not present

## 2023-06-26 DIAGNOSIS — E119 Type 2 diabetes mellitus without complications: Secondary | ICD-10-CM

## 2023-06-26 DIAGNOSIS — E1165 Type 2 diabetes mellitus with hyperglycemia: Secondary | ICD-10-CM | POA: Insufficient documentation

## 2023-06-26 HISTORY — DX: Acute myocardial infarction, unspecified: I21.9

## 2023-06-26 LAB — CBC
HCT: 42.1 % (ref 39.0–52.0)
Hemoglobin: 14.1 g/dL (ref 13.0–17.0)
MCH: 30.2 pg (ref 26.0–34.0)
MCHC: 33.5 g/dL (ref 30.0–36.0)
MCV: 90.1 fL (ref 80.0–100.0)
Platelets: 181 10*3/uL (ref 150–400)
RBC: 4.67 MIL/uL (ref 4.22–5.81)
RDW: 12.7 % (ref 11.5–15.5)
WBC: 6.2 10*3/uL (ref 4.0–10.5)
nRBC: 0 % (ref 0.0–0.2)

## 2023-06-26 LAB — HEMOGLOBIN A1C
Hgb A1c MFr Bld: 11.5 % — ABNORMAL HIGH (ref 4.8–5.6)
Mean Plasma Glucose: 283.35 mg/dL

## 2023-06-26 LAB — GLUCOSE, CAPILLARY: Glucose-Capillary: 298 mg/dL — ABNORMAL HIGH (ref 70–99)

## 2023-06-26 NOTE — Progress Notes (Signed)
Lab results.A1C: 11.5

## 2023-06-26 NOTE — Progress Notes (Signed)
For Anesthesia: PCP - Renford Dills, MD  Cardiologist - Marykay Lex, MD   Bowel Prep reminder:  Chest x-ray - 12/09/22 EKG - 12/12/22 Stress Test -  ECHO - 10/21/22 Cardiac Cath - 10/20/22 Pacemaker/ICD device last checked: Pacemaker orders received: Device Rep notified:  Spinal Cord Stimulator:N/A  Sleep Study - N/A CPAP -   Fasting Blood Sugar - 100's Checks Blood Sugar : continuous freestyle monitor Date and result of last Hgb A1c-  Last dose of GLP1 agonist- N/A GLP1 instructions:   Last dose of SGLT-2 inhibitors- N/A SGLT-2 instructions:   Blood Thinner Instructions: pt. Was advised to call Dr. Herbie Baltimore for instructions. Aspirin Instructions: Last Dose:  Activity level: Can go up a flight of stairs and activities of daily living without stopping and without chest pain and/or shortness of breath   Able to exercise without chest pain and/or shortness of breath  Anesthesia review: Hx: CAD,HTN,NSTEMI,DIA  Patient denies shortness of breath, fever, cough and chest pain at PAT appointment   Patient verbalized understanding of instructions that were given to them at the PAT appointment. Patient was also instructed that they will need to review over the PAT instructions again at home before surgery.

## 2023-06-27 ENCOUNTER — Telehealth: Payer: Self-pay

## 2023-06-27 ENCOUNTER — Telehealth: Payer: Self-pay | Admitting: *Deleted

## 2023-06-27 NOTE — Telephone Encounter (Signed)
   Pre-operative Risk Assessment    Patient Name: Sean Schaefer  DOB: July 21, 1966 MRN: [id number]  LAST APPT 02/03/23, NO FUTURE APPOINTMENT SCHEDULED    Request for Surgical Clearance    Procedure:   PLACEMENT OF DRAINING SETONS SURGERY  Date of Surgery:  Clearance 07/03/23                                 Surgeon:  Marin Olp, MD Surgeon's Group or Practice Name:  [facility] Phone number:  [phone] Fax number:  [phone]   Type of Clearance Requested:   - Medical   PER CLEARANCE, DR. WHITE FEELS IT IS SAFE TO CONTINUE ANTICOAGULATION   Type of Anesthesia:  General    Additional requests/questions:    Signed, Elliot Cousin   06/27/2023, 1:25 PM

## 2023-06-27 NOTE — Telephone Encounter (Signed)
  Patient Consent for Virtual Visit        Sean Schaefer has provided verbal consent on 06/27/2023 for a virtual visit (video or telephone).   CONSENT FOR VIRTUAL VISIT FOR:  Sean Schaefer  By participating in this virtual visit I agree to the following:  I hereby voluntarily request, consent and authorize [facility] and its employed or contracted physicians, physician assistants, nurse practitioners or other licensed health care professionals (the Practitioner), to provide me with telemedicine health care services (the "Services") as deemed necessary by the treating Practitioner. I acknowledge and consent to receive the Services by the Practitioner via telemedicine. I understand that the telemedicine visit will involve communicating with the Practitioner through live audiovisual communication technology and the disclosure of certain medical information by electronic transmission. I acknowledge that I have been given the opportunity to request an in-person assessment or other available alternative prior to the telemedicine visit and am voluntarily participating in the telemedicine visit.  I understand that I have the right to withhold or withdraw my consent to the use of telemedicine in the course of my care at any time, without affecting my right to future care or treatment, and that the Practitioner or I may terminate the telemedicine visit at any time. I understand that I have the right to inspect all information obtained and/or recorded in the course of the telemedicine visit and may receive copies of available information for a reasonable fee.  I understand that some of the potential risks of receiving the Services via telemedicine include:  Delay or interruption in medical evaluation due to technological equipment failure or disruption; Information transmitted may not be sufficient (e.g. poor resolution of images) to allow for appropriate medical decision making by the Practitioner;  and/or  In rare instances, security protocols could fail, causing a breach of personal health information.  Furthermore, I acknowledge that it is my responsibility to provide information about my medical history, conditions and care that is complete and accurate to the best of my ability. I acknowledge that Practitioner's advice, recommendations, and/or decision may be based on factors not within their control, such as incomplete or inaccurate data provided by me or distortions of diagnostic images or specimens that may result from electronic transmissions. I understand that the practice of medicine is not an exact science and that Practitioner makes no warranties or guarantees regarding treatment outcomes. I acknowledge that a copy of this consent can be made available to me via my patient portal [facility] MyChart), or I can request a printed copy by calling the office of [facility].    I understand that my insurance will be billed for this visit.   I have read or had this consent read to me. I understand the contents of this consent, which adequately explains the benefits and risks of the Services being provided via telemedicine.  I have been provided ample opportunity to ask questions regarding this consent and the Services and have had my questions answered to my satisfaction. I give my informed consent for the services to be provided through the use of telemedicine in my medical care

## 2023-06-27 NOTE — Telephone Encounter (Signed)
Spoke with patient who is agreeable to do a tele visit on 12/10 at 10:40 am. Med rec and consent done.

## 2023-06-27 NOTE — Progress Notes (Signed)
DISCUSSION: Sean Schaefer is a 57 yo male who presents to PAT prior to PLACEMENT OF DRAINING SETONS, SURGICAL TREATMENT OF TRANSSPHINCTERIC ANAL FISTULAS, ANORECTAL EXAM UNDER ANESTHESIA on 07/03/23 with Dr. Cliffton Asters. PMH of HTN, hx of MI, CAD, dilation of aorta, Crohns disease.  Patient was admitted in March for NSTEMI. Underwent PCI to the LAC and LCx. Last seen in clinic on 02/03/23. Noted to be doing well from a cardiac standpoint. No symptoms and exercising without symptoms. Current on DAPT. Per Dr. Cliffton Asters pt can continue DAPT through peri-operative period. Patient has had multiple surgeries which have been done inpatient and done well. Cardiac clearance requested.     VS: BP (!) 139/96   Pulse 83   Temp 36.6 C (Oral)   Ht 5\' 10"  (1.778 m)   Wt 86.6 kg   SpO2 100%   BMI 27.41 kg/m   PROVIDERS: Renford Dills, MD   LABS: {CHL AN LABS REVIEWED:112001::"Labs reviewed: Acceptable for surgery."} (all labs ordered are listed, but only abnormal results are displayed)  Labs Reviewed  HEMOGLOBIN A1C - Abnormal; Notable for the following components:      Result Value   Hgb A1c MFr Bld 11.5 (*)    All other components within normal limits  GLUCOSE, CAPILLARY - Abnormal; Notable for the following components:   Glucose-Capillary 298 (*)    All other components within normal limits  CBC     IMAGES:   EKG:   CV:  Past Medical History:  Diagnosis Date   CAD (coronary artery disease)    Crohn's disease (HCC)    Diabetes mellitus (HCC)    Dilation of aorta (HCC)    Hypertension    Myocardial infarction (HCC)    Ulcerative colitis (HCC)     Past Surgical History:  Procedure Laterality Date   BIOPSY  12/12/2022   Procedure: BIOPSY;  Surgeon: Benancio Deeds, MD;  Location: [facility] ENDOSCOPY;  Service: Gastroenterology;;   COLON SURGERY     COLONOSCOPY     CORONARY BALLOON ANGIOPLASTY N/A 10/20/2022   Procedure: CORONARY BALLOON ANGIOPLASTY;  Surgeon: Marykay Lex, MD;   Location: [facility] INVASIVE CV LAB;  Service: Cardiovascular;  Laterality: N/A;   CORONARY STENT INTERVENTION N/A 10/20/2022   Procedure: CORONARY STENT INTERVENTION;  Surgeon: Marykay Lex, MD;  Location: [facility] INVASIVE CV LAB;  Service: Cardiovascular;  Laterality: N/A;   ESOPHAGOGASTRODUODENOSCOPY (EGD) WITH PROPOFOL N/A 12/12/2022   Procedure: ESOPHAGOGASTRODUODENOSCOPY (EGD) WITH PROPOFOL;  Surgeon: Benancio Deeds, MD;  Location: [facility] ENDOSCOPY;  Service: Gastroenterology;  Laterality: N/A;   EYE SURGERY Left    FLEXIBLE SIGMOIDOSCOPY N/A 02/05/2021   Procedure: FLEXIBLE POUCHOSCOPY WITH BIOPSY;  Surgeon: Andria Meuse, MD;  Location: [facility] ORS;  Service: General;  Laterality: N/A;   INCISION AND DRAINAGE ABSCESS N/A 02/05/2021   Procedure: INCISION AND DRAINAGE OF PERIANAL ABSCESS;  Surgeon: Andria Meuse, MD;  Location: [facility] ORS;  Service: General;  Laterality: N/A;   INCISION AND DRAINAGE ABSCESS N/A 12/21/2022   Procedure: INCISION AND DRAINAGE ABSCESS;  Surgeon: Berna Bue, MD;  Location: [facility] OR;  Service: General;  Laterality: N/A;   INCISION AND DRAINAGE ABSCESS Left 03/13/2023   Procedure: INCISION AND DRAINAGE ABSCESS;  Surgeon: Manus Rudd, MD;  Location: [facility] ORS;  Service: General;  Laterality: Left;   INCISION AND DRAINAGE PERIRECTAL ABSCESS N/A 11/30/2020   Procedure: IRRIGATION AND DEBRIDEMENT PERIRECTAL ABSCESS;  Surgeon: Fritzi Mandes, MD;  Location: [facility] OR;  Service: General;  Laterality: N/A;  INCISION AND DRAINAGE PERIRECTAL ABSCESS Left 04/03/2021   Procedure: left peri-rectal/gluteal abscess;  Surgeon: Diamantina Monks, MD;  Location: [facility] OR;  Service: General;  Laterality: Left;   LEFT HEART CATH AND CORONARY ANGIOGRAPHY N/A 10/20/2022   Procedure: LEFT HEART CATH AND CORONARY ANGIOGRAPHY;  Surgeon: Marykay Lex, MD;  Location: [facility] INVASIVE CV LAB;  Service: Cardiovascular;  Laterality: N/A;   Perianal fistula repair  07/22/2010   PLACEMENT OF SETON N/A  02/05/2021   Procedure: PLACEMENT OF SETONS X2;  Surgeon: Andria Meuse, MD;  Location: [facility] ORS;  Service: General;  Laterality: N/A;   RECTAL EXAM UNDER ANESTHESIA N/A 02/05/2021   Procedure: ANORECTAL EXAM UNDER ANESTHESIA;  Surgeon: Andria Meuse, MD;  Location: [facility] ORS;  Service: General;  Laterality: N/A;   TESTICLE REMOVAL Left 2000    MEDICATIONS:  aspirin EC 81 MG tablet   carvedilol (COREG) 12.5 MG tablet   carvedilol (COREG) 12.5 MG tablet   Continuous Glucose Sensor (FREESTYLE LIBRE 14 DAY SENSOR) MISC   insulin degludec (TRESIBA FLEXTOUCH) 100 UNIT/ML FlexTouch Pen   insulin lispro (HUMALOG) 100 UNIT/ML injection   methocarbamol (ROBAXIN) 500 MG tablet   metroNIDAZOLE (FLAGYL) 500 MG tablet   nitroGLYCERIN (NITROSTAT) 0.4 MG SL tablet   ondansetron (ZOFRAN-ODT) 4 MG disintegrating tablet   oxyCODONE (OXY IR/ROXICODONE) 5 MG immediate release tablet   pantoprazole (PROTONIX) 40 MG tablet   Risankizumab-rzaa (SKYRIZI) 180 MG/1.2ML SOCT   rosuvastatin (CRESTOR) 40 MG tablet   sodium chloride irrigation 0.9 % irrigation   ticagrelor (BRILINTA) 90 MG TABS tablet    Insulin Pen Needle (NOVOFINE) 10 each

## 2023-06-27 NOTE — Telephone Encounter (Signed)
Patient was returning call. Please advise ?

## 2023-06-27 NOTE — Telephone Encounter (Signed)
   Name: Sean Schaefer  DOB: February 25, 1966  MRN: [id number]  Primary Cardiologist: Bryan Lemma, MD   Preoperative team, please contact this patient and set up a phone call appointment for further preoperative risk assessment. Please obtain consent and complete medication review. Thank you for your help.  I confirm that guidance regarding antiplatelet and oral anticoagulation therapy has been completed and, if necessary, noted below.  I also confirmed the patient resides in the state of [place]. As per [facility] Medical Board telemedicine laws, the patient must reside in the state in which the provider is licensed.   Marcelino Duster, PA 06/27/2023, 1:43 PM [facility]

## 2023-06-27 NOTE — Telephone Encounter (Signed)
1st attempt to reach pt regarding surgical clearance and the need for a tele-visit.  Left pt a message to call back and ask for the preop team. 

## 2023-06-30 ENCOUNTER — Telehealth: Payer: Self-pay

## 2023-06-30 NOTE — Telephone Encounter (Signed)
Called patient to notify him of appointment in January- left voicemail.

## 2023-07-01 ENCOUNTER — Ambulatory Visit: Payer: 59 | Attending: Cardiovascular Disease | Admitting: Student

## 2023-07-01 DIAGNOSIS — Z01810 Encounter for preprocedural cardiovascular examination: Secondary | ICD-10-CM | POA: Diagnosis not present

## 2023-07-01 NOTE — Progress Notes (Signed)
Virtual Visit via Telephone Note   Because of Sean Schaefer's co-morbid illnesses, he is at least at moderate risk for complications without adequate follow up.  This format is felt to be most appropriate for this patient at this time.  The patient did not have access to video technology/had technical difficulties with video requiring transitioning to audio format only (telephone).  All issues noted in this document were discussed and addressed.  No physical exam could be performed with this format.  Please refer to the patient's chart for his consent to telehealth for [facility].  Evaluation Performed:  Preoperative cardiovascular risk assessment _____________   Date:  07/01/2023   Patient ID:  Sean Schaefer, DOB [birth year], MRN [id number] Patient Location:  Home Provider location:   Office  Primary Care Provider:  Renford Dills, MD Primary Cardiologist:  Bryan Lemma, MD  Chief Complaint / Patient Profile   57 y.o. y/o male with a h/o CAD s/p PCI with balloon angioplasty to proximal LCx and DES to ostial proximal LAD March 2024, hypertension, hyperlipidemia, T2DM who is pending placement of draining setons by Dr. Cliffton Asters on 07/03/2023 and presents today for telephonic preoperative cardiovascular risk assessment.  History of Present Illness    Sean Schaefer is a 57 y.o. male who presents via audio/video conferencing for a telehealth visit today.  Pt was last seen in cardiology clinic on 02/03/2023 by Bernadene Person, NP.  At that time Sean Schaefer was stable from a cardiac standpoint.  The patient is now pending procedure as outlined above. Since his last visit, he is doing well. Patient denies shortness of breath, dyspnea on exertion, lower extremity edema, orthopnea or PND. No chest pain, pressure, or tightness. No palpitations. He has not been exercising in the last month or so. [facility] He is independent with ADLs and able to perform light to  moderate household activities.   Past Medical History    Past Medical History:  Diagnosis Date   CAD (coronary artery disease)    Crohn's disease (HCC)    Diabetes mellitus (HCC)    Dilation of aorta (HCC)    Hypertension    Myocardial infarction (HCC)    Ulcerative colitis (HCC)    Past Surgical History:  Procedure Laterality Date   BIOPSY  12/12/2022   Procedure: BIOPSY;  Surgeon: Benancio Deeds, MD;  Location: [facility] ENDOSCOPY;  Service: Gastroenterology;;   COLON SURGERY     COLONOSCOPY     CORONARY BALLOON ANGIOPLASTY N/A 10/20/2022   Procedure: CORONARY BALLOON ANGIOPLASTY;  Surgeon: Marykay Lex, MD;  Location: [facility] INVASIVE CV LAB;  Service: Cardiovascular;  Laterality: N/A;   CORONARY STENT INTERVENTION N/A 10/20/2022   Procedure: CORONARY STENT INTERVENTION;  Surgeon: Marykay Lex, MD;  Location: [facility] INVASIVE CV LAB;  Service: Cardiovascular;  Laterality: N/A;   ESOPHAGOGASTRODUODENOSCOPY (EGD) WITH PROPOFOL N/A 12/12/2022   Procedure: ESOPHAGOGASTRODUODENOSCOPY (EGD) WITH PROPOFOL;  Surgeon: Benancio Deeds, MD;  Location: [facility] ENDOSCOPY;  Service: Gastroenterology;  Laterality: N/A;   EYE SURGERY Left    FLEXIBLE SIGMOIDOSCOPY N/A 02/05/2021   Procedure: FLEXIBLE POUCHOSCOPY WITH BIOPSY;  Surgeon: Andria Meuse, MD;  Location: [facility] ORS;  Service: General;  Laterality: N/A;   INCISION AND DRAINAGE ABSCESS N/A 02/05/2021   Procedure: INCISION AND DRAINAGE OF PERIANAL ABSCESS;  Surgeon: Andria Meuse, MD;  Location: [facility] ORS;  Service: General;  Laterality: N/A;   INCISION AND DRAINAGE ABSCESS N/A 12/21/2022  Procedure: INCISION AND DRAINAGE ABSCESS;  Surgeon: Berna Bue, MD;  Location: [facility] OR;  Service: General;  Laterality: N/A;   INCISION AND DRAINAGE ABSCESS Left 03/13/2023   Procedure: INCISION AND DRAINAGE ABSCESS;  Surgeon: Manus Rudd, MD;  Location: [facility] ORS;  Service: General;  Laterality: Left;   INCISION AND DRAINAGE PERIRECTAL ABSCESS N/A  11/30/2020   Procedure: IRRIGATION AND DEBRIDEMENT PERIRECTAL ABSCESS;  Surgeon: Fritzi Mandes, MD;  Location: [facility] OR;  Service: General;  Laterality: N/A;   INCISION AND DRAINAGE PERIRECTAL ABSCESS Left 04/03/2021   Procedure: left peri-rectal/gluteal abscess;  Surgeon: Diamantina Monks, MD;  Location: [facility] OR;  Service: General;  Laterality: Left;   LEFT HEART CATH AND CORONARY ANGIOGRAPHY N/A 10/20/2022   Procedure: LEFT HEART CATH AND CORONARY ANGIOGRAPHY;  Surgeon: Marykay Lex, MD;  Location: [facility] INVASIVE CV LAB;  Service: Cardiovascular;  Laterality: N/A;   Perianal fistula repair  07/22/2010   PLACEMENT OF SETON N/A 02/05/2021   Procedure: PLACEMENT OF SETONS X2;  Surgeon: Andria Meuse, MD;  Location: [facility] ORS;  Service: General;  Laterality: N/A;   RECTAL EXAM UNDER ANESTHESIA N/A 02/05/2021   Procedure: ANORECTAL EXAM UNDER ANESTHESIA;  Surgeon: Andria Meuse, MD;  Location: [facility] ORS;  Service: General;  Laterality: N/A;   TESTICLE REMOVAL Left 2000    Allergies  Allergies  Allergen Reactions   Statins Other (See Comments)    Myalgias, tolerates rosuvastatin      Home Medications    Prior to Admission medications   Medication Sig Start Date End Date Taking? Authorizing Provider  aspirin EC 81 MG tablet Take 1 tablet (81 mg total) by mouth daily. Swallow whole. Patient taking differently: Take 81 mg by mouth in the morning. Swallow whole. 10/23/22   Zannie Cove, MD  carvedilol (COREG) 12.5 MG tablet Take 6.25 mg ( 1/2 tablet of 12.5 mg)  in the morning and 12.5 mg  in the evening Patient taking differently: Take 6.25-12.5 mg by mouth See admin instructions. Take 6.25 mg by mouth in the morning and 12.5 mg at bedtime 12/06/22   Marykay Lex, MD  carvedilol (COREG) 12.5 MG tablet Take 1 tablet (12.5 mg total) by mouth 2 (two) times daily with a meal. Patient not taking: Reported on 06/24/2023 03/25/23   Joylene Grapes, NP  Continuous Glucose Sensor (FREESTYLE  LIBRE 14 DAY SENSOR) MISC Apply 1 sensor to the back of upper arm every 14 days 10/23/22     insulin degludec (TRESIBA FLEXTOUCH) 100 UNIT/ML FlexTouch Pen Inject 38 Units into the skin daily. Patient taking differently: Inject 38 Units into the skin at bedtime. 12/25/22   Hongalgi, Maximino Greenland, MD  insulin lispro (HUMALOG) 100 UNIT/ML injection Inject 10 Units into the skin 3 (three) times daily with meals.    [provider]  methocarbamol (ROBAXIN) 500 MG tablet Take 1 tablet (500 mg total) by mouth every 6 (six) hours as needed. Patient not taking: Reported on 06/24/2023 03/21/23     metroNIDAZOLE (FLAGYL) 500 MG tablet Take 1 tablet (500 mg total) by mouth 2 (two) times daily. Patient not taking: Reported on 06/24/2023 05/28/23   Sherrilyn Rist, MD  nitroGLYCERIN (NITROSTAT) 0.4 MG SL tablet Place 1 tablet (0.4 mg total) under the tongue every 5 (five) minutes as needed for chest pain. 11/04/22 06/24/23  Joylene Grapes, NP  ondansetron (ZOFRAN-ODT) 4 MG disintegrating tablet Dissolve 1 tablet (4 mg total) by mouth every 8 (eight) hours as  needed for nausea or vomiting. Patient not taking: Reported on 06/24/2023 12/13/22   Dorcas Carrow, MD  oxyCODONE (OXY IR/ROXICODONE) 5 MG immediate release tablet Take 1 tablet (5 mg total) by mouth every 6 (six) hours as needed for severe pain (pain score 7-10). Patient not taking: Reported on 06/24/2023 05/28/23   Sherrilyn Rist, MD  pantoprazole (PROTONIX) 40 MG tablet Take 1 tablet (40 mg total) by mouth 2 (two) times daily. Patient not taking: Reported on 03/12/2023 12/13/22 12/13/23  Dorcas Carrow, MD  Risankizumab-rzaa [facility]) 180 MG/1.2ML SOCT Inject 1.2 mLs into the skin every 8 (eight) weeks. 04/14/23   Quentin Angst, MD  rosuvastatin (CRESTOR) 40 MG tablet Take 1 tablet (40 mg total) by mouth daily. 02/24/23   Marykay Lex, MD  sodium chloride irrigation 0.9 % irrigation Use as directed by MD daily 04/03/23     ticagrelor (BRILINTA) 90 MG  TABS tablet Take 1 tablet (90 mg) by mouth 2 times daily. 06/24/23 06/23/24  Joylene Grapes, NP    Physical Exam    Vital Signs:  Sean Schaefer does not have vital signs available for review today.  Given telephonic nature of communication, physical exam is limited. AAOx3. NAD. Normal affect.  Speech and respirations are unlabored.  Accessory Clinical Findings    None  Assessment & Plan    Primary Cardiologist: Bryan Lemma, MD  Preoperative cardiovascular risk assessment.  Placement of draining setons by Dr. Cliffton Asters on 07/03/2023.  Chart reviewed as part of pre-operative protocol coverage. According to the RCRI, patient has a 6.6% risk of MACE. Patient reports activity equivalent to 5.72 METS (per DASI).   Given past medical history and time since last visit, based on ACC/AHA guidelines, Sean Schaefer would be at acceptable risk for the planned procedure without further cardiovascular testing.   Patient was advised that if he develops new symptoms prior to surgery to contact our office to arrange a follow-up appointment.  he verbalized understanding.  Per requesting office, patient may continue aspirin and Brilinta throughout perioperative period.  I will route this recommendation to the requesting party via Epic fax function.  Please call with questions.  Time:   Today, I have spent 6 minutes with the patient with telehealth technology discussing medical history, symptoms, and management plan.     Carlos Levering, NP  07/01/2023, 7:53 AM

## 2023-07-02 NOTE — Anesthesia Preprocedure Evaluation (Signed)
Anesthesia Evaluation    Airway        Dental   Pulmonary           Cardiovascular hypertension,      Neuro/Psych    GI/Hepatic   Endo/Other  diabetes    Renal/GU      Musculoskeletal   Abdominal   Peds  Hematology   Anesthesia Other Findings   Reproductive/Obstetrics                              Anesthesia Physical Anesthesia Plan  ASA:   Anesthesia Plan:    Post-op Pain Management:    Induction:   PONV Risk Score and Plan:   Airway Management Planned:   Additional Equipment:   Intra-op Plan:   Post-operative Plan:   Informed Consent:   Plan Discussed with:   Anesthesia Plan Comments: (See PAT note from 12/5 by Sherlie Ban PA-C )         Anesthesia Quick Evaluation  4. The aortic valve is tricuspid. Aortic valve regurgitation is not visualized. Aortic valve sclerosis/calcification is present, without any evidence of aortic stenosis.  5. Aortic dilatation noted. There is moderate dilatation of the ascending aorta, measuring 43 mm.  6. The inferior vena cava is dilated in size with <50%  respiratory variability, suggesting right atrial pressure of 15 mmHg.   Left heart cath 10/20/22:   POST-CATH DIAGNOSES Three-Vessel Disease: Culprit Lesion: 99% proximal LCx into 100% OM1, TIMI 0 flow->  Successful Balloon PTCA restoring TIMI-3 flow reducing to 30% stenosis. Focal ostial-proximal LAD 85% stenosis (TIMI-3 flow) followed by sequential 60% lesions in the mid and still vessel as well as 30% a goal with diffuse disease, 1 diminutive D1 1 followed by 2 small caliber D2 and D3 branches mild disease. ->  Successful DES PCI of ostial to proximal LAD with Synergy DES 3.0 mm x 16 mm deployed to 3.3 mm -> reduced to 0% (TIMI 3 flow pre & post) Large dominant RCA with proximal 25% and distal 20% lesions. There is a small posterolateral branch in the anterior descending artery actually bifurcates into get again another posterolateral branch that cover the large portion of the circumflex territory. This vessel has still percent ostial disease.   Relatively normal LVEF with no obvious wall motion abnormality. Normal EDP.   )        Anesthesia Quick Evaluation

## 2023-07-03 ENCOUNTER — Encounter (HOSPITAL_COMMUNITY)
Admission: RE | Disposition: A | Discharge status: Home / Self Care | Payer: Self-pay | Source: Home / Self Care | Attending: Surgery

## 2023-07-03 ENCOUNTER — Encounter (HOSPITAL_COMMUNITY): Payer: Self-pay | Admitting: Surgery

## 2023-07-03 ENCOUNTER — Other Ambulatory Visit (HOSPITAL_COMMUNITY): Payer: Self-pay

## 2023-07-03 ENCOUNTER — Ambulatory Visit (HOSPITAL_BASED_OUTPATIENT_CLINIC_OR_DEPARTMENT_OTHER): Payer: Self-pay | Admitting: Anesthesiology

## 2023-07-03 ENCOUNTER — Ambulatory Visit (HOSPITAL_COMMUNITY): Payer: Self-pay | Admitting: Medical

## 2023-07-03 ENCOUNTER — Ambulatory Visit (HOSPITAL_COMMUNITY)
Admission: RE | Admit: 2023-07-03 | Discharge: 2023-07-03 | Disposition: A | Discharge status: Home / Self Care | Payer: 59 | Attending: Surgery | Admitting: Surgery

## 2023-07-03 DIAGNOSIS — I252 Old myocardial infarction: Secondary | ICD-10-CM | POA: Diagnosis not present

## 2023-07-03 DIAGNOSIS — I10 Essential (primary) hypertension: Secondary | ICD-10-CM | POA: Insufficient documentation

## 2023-07-03 DIAGNOSIS — K50914 Crohn's disease, unspecified, with abscess: Secondary | ICD-10-CM | POA: Insufficient documentation

## 2023-07-03 DIAGNOSIS — Z794 Long term (current) use of insulin: Secondary | ICD-10-CM | POA: Insufficient documentation

## 2023-07-03 DIAGNOSIS — Z955 Presence of coronary angioplasty implant and graft: Secondary | ICD-10-CM | POA: Diagnosis not present

## 2023-07-03 DIAGNOSIS — K50913 Crohn's disease, unspecified, with fistula: Secondary | ICD-10-CM | POA: Diagnosis not present

## 2023-07-03 DIAGNOSIS — K50113 Crohn's disease of large intestine with fistula: Secondary | ICD-10-CM | POA: Diagnosis not present

## 2023-07-03 DIAGNOSIS — K50918 Crohn's disease, unspecified, with other complication: Secondary | ICD-10-CM | POA: Diagnosis not present

## 2023-07-03 DIAGNOSIS — E119 Type 2 diabetes mellitus without complications: Secondary | ICD-10-CM | POA: Diagnosis not present

## 2023-07-03 DIAGNOSIS — K5090 Crohn's disease, unspecified, without complications: Secondary | ICD-10-CM | POA: Diagnosis not present

## 2023-07-03 DIAGNOSIS — I2510 Atherosclerotic heart disease of native coronary artery without angina pectoris: Secondary | ICD-10-CM | POA: Insufficient documentation

## 2023-07-03 DIAGNOSIS — K60312 Anal fistula, simple, persistent: Secondary | ICD-10-CM | POA: Diagnosis not present

## 2023-07-03 HISTORY — PX: PLACEMENT OF SETON: SHX6029

## 2023-07-03 HISTORY — PX: RECTAL EXAM UNDER ANESTHESIA: SHX6399

## 2023-07-03 HISTORY — PX: ANAL FISTULOTOMY: SHX6423

## 2023-07-03 LAB — GLUCOSE, CAPILLARY
Glucose-Capillary: 260 mg/dL — ABNORMAL HIGH (ref 70–99)
Glucose-Capillary: 184 mg/dL — ABNORMAL HIGH (ref 70–99)
Glucose-Capillary: 189 mg/dL — ABNORMAL HIGH (ref 70–99)

## 2023-07-03 LAB — BASIC METABOLIC PANEL
Anion gap: 9 (ref 5–15)
BUN: 14 mg/dL (ref 6–20)
CO2: 23 mmol/L (ref 22–32)
Calcium: 9.3 mg/dL (ref 8.9–10.3)
Chloride: 97 mmol/L — ABNORMAL LOW (ref 98–111)
Creatinine, Ser: 1.34 mg/dL — ABNORMAL HIGH (ref 0.61–1.24)
GFR, Estimated: >60 mL/min (ref >60)
Glucose, Bld: 268 mg/dL — ABNORMAL HIGH (ref 70–99)
Potassium: 4.5 mmol/L (ref 3.5–5.1)
Sodium: 129 mmol/L — ABNORMAL LOW (ref 135–145)

## 2023-07-03 SURGERY — PLACEMENT, SETON
Anesthesia: General

## 2023-07-03 MED ORDER — ACETAMINOPHEN 160 MG/5ML PO SOLN: 325.0000 mg | ORAL | Status: DC | PRN

## 2023-07-03 MED ORDER — BUPIVACAINE LIPOSOME 1.3 % IJ SUSP
INTRAMUSCULAR | Status: AC
  Filled 2023-07-03: qty 20

## 2023-07-03 MED ORDER — SODIUM CHLORIDE 0.9 % IV SOLN
2.0000 g | INTRAVENOUS | Status: AC
  Administered 2023-07-03: 2 g via INTRAVENOUS
  Filled 2023-07-03: qty 2

## 2023-07-03 MED ORDER — ONDANSETRON HCL 4 MG/2ML IJ SOLN
INTRAMUSCULAR | Status: DC | PRN
  Administered 2023-07-03: 4 mg via INTRAVENOUS

## 2023-07-03 MED ORDER — DEXAMETHASONE SODIUM PHOSPHATE 10 MG/ML IJ SOLN
INTRAMUSCULAR | Status: DC | PRN
  Administered 2023-07-03: 4 mg via INTRAVENOUS

## 2023-07-03 MED ORDER — ACETAMINOPHEN 325 MG PO TABS: 325.0000 mg | ORAL_TABLET | ORAL | Status: DC | PRN

## 2023-07-03 MED ORDER — OXYCODONE HCL 5 MG PO TABS: 5.0000 mg | ORAL_TABLET | Freq: Once | ORAL | Status: DC | PRN

## 2023-07-03 MED ORDER — BUPIVACAINE-EPINEPHRINE 0.25% -1:200000 IJ SOLN
INTRAMUSCULAR | Status: AC
  Filled 2023-07-03: qty 1

## 2023-07-03 MED ORDER — ONDANSETRON HCL 4 MG/2ML IJ SOLN: 4.0000 mg | Freq: Once | INTRAMUSCULAR | Status: DC | PRN

## 2023-07-03 MED ORDER — BUPIVACAINE LIPOSOME 1.3 % IJ SUSP: 20.0000 mL | Freq: Once | INTRAMUSCULAR | Status: DC

## 2023-07-03 MED ORDER — EPHEDRINE SULFATE-NACL 50-0.9 MG/10ML-% IV SOSY
PREFILLED_SYRINGE | INTRAVENOUS | Status: DC | PRN
  Administered 2023-07-03: 10 mg via INTRAVENOUS

## 2023-07-03 MED ORDER — ONDANSETRON HCL 4 MG/2ML IJ SOLN
INTRAMUSCULAR | Status: AC
  Filled 2023-07-03: qty 2

## 2023-07-03 MED ORDER — BUPIVACAINE LIPOSOME 1.3 % IJ SUSP
INTRAMUSCULAR | Status: DC | PRN
  Administered 2023-07-03: 20 mL

## 2023-07-03 MED ORDER — MIDAZOLAM HCL 2 MG/2ML IJ SOLN
INTRAMUSCULAR | Status: DC | PRN
  Administered 2023-07-03: 2 mg via INTRAVENOUS

## 2023-07-03 MED ORDER — EPHEDRINE 5 MG/ML INJ
INTRAVENOUS | Status: AC
  Filled 2023-07-03: qty 5

## 2023-07-03 MED ORDER — CHLORHEXIDINE GLUCONATE 0.12 % MT SOLN
15.0000 mL | Freq: Once | OROMUCOSAL | Status: AC
  Administered 2023-07-03: 15 mL via OROMUCOSAL

## 2023-07-03 MED ORDER — LACTATED RINGERS IV SOLN: INTRAVENOUS | Status: DC

## 2023-07-03 MED ORDER — PHENYLEPHRINE 80 MCG/ML (10ML) SYRINGE FOR IV PUSH (FOR BLOOD PRESSURE SUPPORT)
PREFILLED_SYRINGE | INTRAVENOUS | Status: AC
  Filled 2023-07-03: qty 20

## 2023-07-03 MED ORDER — MIDAZOLAM HCL 2 MG/2ML IJ SOLN
INTRAMUSCULAR | Status: AC
  Filled 2023-07-03: qty 2

## 2023-07-03 MED ORDER — PROPOFOL 1000 MG/100ML IV EMUL
INTRAVENOUS | Status: AC
  Filled 2023-07-03: qty 100

## 2023-07-03 MED ORDER — ORAL CARE MOUTH RINSE: 15.0000 mL | Freq: Once | OROMUCOSAL | Status: AC

## 2023-07-03 MED ORDER — PROPOFOL 500 MG/50ML IV EMUL
INTRAVENOUS | Status: DC | PRN
  Administered 2023-07-03: 175 ug/kg/min via INTRAVENOUS

## 2023-07-03 MED ORDER — DEXAMETHASONE SODIUM PHOSPHATE 10 MG/ML IJ SOLN
INTRAMUSCULAR | Status: AC
  Filled 2023-07-03: qty 1

## 2023-07-03 MED ORDER — LIDOCAINE 2% (20 MG/ML) 5 ML SYRINGE
INTRAMUSCULAR | Status: DC | PRN
  Administered 2023-07-03: 60 mg via INTRAVENOUS

## 2023-07-03 MED ORDER — BUPIVACAINE-EPINEPHRINE (PF) 0.25% -1:200000 IJ SOLN
INTRAMUSCULAR | Status: DC | PRN
  Administered 2023-07-03: 30 mL via PERINEURAL

## 2023-07-03 MED ORDER — PROPOFOL 10 MG/ML IV BOLUS
INTRAVENOUS | Status: DC | PRN
  Administered 2023-07-03: 170 mg via INTRAVENOUS

## 2023-07-03 MED ORDER — TRAMADOL HCL 50 MG PO TABS
50.0000 mg | ORAL_TABLET | Freq: Four times a day (QID) | ORAL | 0 refills | Status: AC | PRN
  Filled 2023-07-03: qty 10, 3d supply, fill #0

## 2023-07-03 MED ORDER — CHLORHEXIDINE GLUCONATE CLOTH 2 % EX PADS: 6.0000 | MEDICATED_PAD | Freq: Once | CUTANEOUS | Status: DC

## 2023-07-03 MED ORDER — BACITRACIN-NEOMYCIN-POLYMYXIN OINTMENT TUBE
TOPICAL_OINTMENT | CUTANEOUS | Status: AC
  Filled 2023-07-03: qty 28.34

## 2023-07-03 MED ORDER — FENTANYL CITRATE PF 50 MCG/ML IJ SOSY: 25.0000 ug | PREFILLED_SYRINGE | INTRAMUSCULAR | Status: DC | PRN

## 2023-07-03 MED ORDER — ACETAMINOPHEN 500 MG PO TABS
1000.0000 mg | ORAL_TABLET | ORAL | Status: AC
  Administered 2023-07-03: 1000 mg via ORAL
  Filled 2023-07-03: qty 2

## 2023-07-03 MED ORDER — OXYCODONE HCL 5 MG/5ML PO SOLN: 5.0000 mg | Freq: Once | ORAL | Status: DC | PRN

## 2023-07-03 MED ORDER — LIDOCAINE HCL (PF) 2 % IJ SOLN
INTRAMUSCULAR | Status: AC
  Filled 2023-07-03: qty 5

## 2023-07-03 MED ORDER — FENTANYL CITRATE (PF) 100 MCG/2ML IJ SOLN
INTRAMUSCULAR | Status: AC
  Filled 2023-07-03: qty 2

## 2023-07-03 MED ORDER — FENTANYL CITRATE (PF) 100 MCG/2ML IJ SOLN
INTRAMUSCULAR | Status: DC | PRN
  Administered 2023-07-03: 50 ug via INTRAVENOUS

## 2023-07-03 MED ORDER — PHENYLEPHRINE 80 MCG/ML (10ML) SYRINGE FOR IV PUSH (FOR BLOOD PRESSURE SUPPORT)
PREFILLED_SYRINGE | INTRAVENOUS | Status: DC | PRN
  Administered 2023-07-03: 160 ug via INTRAVENOUS

## 2023-07-03 MED ORDER — MEPERIDINE HCL 50 MG/ML IJ SOLN: 6.2500 mg | INTRAMUSCULAR | Status: DC | PRN

## 2023-07-03 MED ORDER — INSULIN ASPART 100 UNIT/ML IJ SOLN
0.0000 [IU] | INTRAMUSCULAR | Status: AC | PRN
  Administered 2023-07-03: 4 [IU] via SUBCUTANEOUS
  Administered 2023-07-03: 8 [IU] via SUBCUTANEOUS
  Filled 2023-07-03: qty 1

## 2023-07-03 MED ORDER — PROPOFOL 10 MG/ML IV BOLUS
INTRAVENOUS | Status: AC
  Filled 2023-07-03: qty 20

## 2023-07-03 SURGICAL SUPPLY — 26 items
BAG COUNTER SPONGE SURGICOUNT (BAG) IMPLANT
BRIEF MESH DISP LRG (UNDERPADS AND DIAPERS) ×1 IMPLANT
ELECT NDL BLADE 2-5/6 (NEEDLE) ×1 IMPLANT
ELECT NEEDLE BLADE 2-5/6 (NEEDLE) ×1 IMPLANT
ELECT REM PT RETURN 15FT ADLT (MISCELLANEOUS) ×1 IMPLANT
GAUZE PAD ABD 8X10 STRL (GAUZE/BANDAGES/DRESSINGS) IMPLANT
GAUZE SPONGE 4X4 12PLY STRL (GAUZE/BANDAGES/DRESSINGS) IMPLANT
GLOVE BIO SURGEON STRL SZ7.5 (GLOVE) ×1 IMPLANT
GLOVE INDICATOR 8.0 STRL GRN (GLOVE) ×1 IMPLANT
GOWN STRL REUS W/ TWL XL LVL3 (GOWN DISPOSABLE) ×2 IMPLANT
KIT BASIN OR (CUSTOM PROCEDURE TRAY) ×1 IMPLANT
KIT TURNOVER KIT A (KITS) IMPLANT
NDL HYPO 22X1.5 SAFETY MO (MISCELLANEOUS) ×1 IMPLANT
NEEDLE HYPO 22X1.5 SAFETY MO (MISCELLANEOUS) ×1 IMPLANT
PACK GENERAL/GYN (CUSTOM PROCEDURE TRAY) ×1 IMPLANT
SHEARS HARMONIC 9CM CVD (BLADE) IMPLANT
SPIKE FLUID TRANSFER (MISCELLANEOUS) ×1 IMPLANT
SPONGE SURGIFOAM ABS GEL 100 (HEMOSTASIS) IMPLANT
SURGILUBE 2OZ TUBE FLIPTOP (MISCELLANEOUS) ×1 IMPLANT
SUT CHROMIC 2 0 SH (SUTURE) IMPLANT
SUT CHROMIC 3 0 SH 27 (SUTURE) IMPLANT
SUT MNCRL AB 4-0 PS2 18 (SUTURE) ×1 IMPLANT
SUT VIC AB 3-0 SH 27X BRD (SUTURE) ×1 IMPLANT
SYR 20ML LL LF (SYRINGE) ×1 IMPLANT
TOWEL OR 17X26 10 PK STRL BLUE (TOWEL DISPOSABLE) ×1 IMPLANT
TOWEL OR NON WOVEN STRL DISP B (DISPOSABLE) ×1 IMPLANT

## 2023-07-03 NOTE — Transfer of Care (Signed)
Immediate Anesthesia Transfer of Care Note  Patient: Sean Schaefer  Procedure(s) Performed: PLACEMENT OF DRAINING SETONS SURGICAL TREATMENT OF TRANSSPHINCTERIC ANAL FISTULAS ANORECTAL EXAM UNDER ANESTHESIA  Patient Location: PACU  Anesthesia Type:General  Level of Consciousness: sedated  Airway & Oxygen Therapy: Patient Spontanous Breathing and Patient connected to face mask oxygen  Post-op Assessment: Report given to RN and Post -op Vital signs reviewed and stable  Post vital signs: Reviewed and stable  Last Vitals:  Vitals Value Taken Time  BP 95/69 07/03/23 1706  Temp    Pulse 74 07/03/23 1708  Resp 16 07/03/23 1708  SpO2 100 % 07/03/23 1708  Vitals shown include unfiled device data.  Last Pain:  Vitals:   07/03/23 1400  TempSrc: Oral  PainSc: 0-No pain         Complications: No notable events documented.

## 2023-07-03 NOTE — Op Note (Signed)
07/03/2023  5:13 PM  PATIENT:  Sean Schaefer  57 y.o. male  Patient Care Team: Renford Dills, MD as PCP - General (Internal Medicine) Marykay Lex, MD as PCP - Cardiology (Cardiology)  PRE-OPERATIVE DIAGNOSIS: 1.  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  Perianal Crohn's disease with recurrent perianal abscesses and transsphincteric anal fistulae  POST-OPERATIVE DIAGNOSIS: Same  PROCEDURE: 1.  Surgical treatment of transsphincteric anal fistulas with placement of draining seton x 2. 2.  Incision and debridement of chronic gluteal abscess cavity/wound, 2 x 2 cm 3.  Anorectal exam under anesthesia  SURGEON:  Surgeon(s): Andria Meuse, MD  ASSISTANT: OR Staff   ANESTHESIA:   general  SPECIMEN:  No Specimen  DISPOSITION OF SPECIMEN:  N/A  COUNTS:  Sponge, needle, and instrument counts were reported correct x2 at conclusion.  EBL: 5 mL  PLAN OF CARE: Discharge to home after PACU  PATIENT DISPOSITION:  PACU - hemodynamically stable.  OR FINDINGS: 2 distinct transsphincteric fistula, 1 with an indwelling seton and 1 with a seton that was disrupted during skin prep.   Anterior midline transsphincteric fistula with external opening in the midportion of the perineum and internal opening in the anterior midline just distal to the J-pouch anastomosis.  Controlled with blue vessel loop draining seton Left anterior external opening in the gluteal skin with internal opening shared in the anterior midline just distal to J-pouch anastomosis.  Controlled with blue vessel loop draining seton  DESCRIPTION: The patient was identified in the preoperative holding area and taken to the OR where he was placed on the operating room table. SCDs were placed.  General anesthesia was induced without difficulty. The patient was then positioned in high lithotomy with Allen stirrups. Pressure points were then evaluated and padded.  He was then prepped and draped in usual  sterile fashion.  A surgical timeout was performed indicating the correct patient, procedure, and positioning.  A perianal block was performed using a dilute mixture of 0.25% Marcaine with epinephrine and Exparel.   Externally, there is some punctate skin breakdown right at the anal os consistent with chronic moisture exposure and J-pouch.  After ascertaining that an appropriate level of anesthesia had been achieved, a well lubricated digital rectal exam was performed. This demonstrated no palpable masses.  Mild intrinsic stenosis but easily admits a lubricated finger.  J-pouch anastomosis approximately 2 cm above the anal verge.  A small Hill-Ferguson anoscope was into the anal canal and circumferential inspection demonstrated chronic inflammation within the distal anal canal but no evident masses or other concerning findings.  Fibrotic/thickened skin.  Granulation tissue at the level of his internal opening.  Distalmost aspect of the J-pouch appears with healthy mucosa.    Attention is first directed at the indwelling seton in the left anterior position. This is now brown in color and appears tenuous.  This was exchanged over a semirigid fistula probe for a new blue vessel loop seton.  This was subsequently secured to itself using 2-0 silk ties.  The prior seton that was in the anterior midline portion of the perineum had been disrupted.  The evident external opening with granulation tissue was then carefully probed with a semirigid fistula probe.  We are easily able to identify the internal opening without creating any false passes.  We are then able to control this tract.  After doing this, we exchanged our fistula probe for a blue vessel loop seton.  This was subsequently secured to itself  using 2-0 silk ties.  The anterior midline anal fistula is transsphincteric in nature extending up to or just below the dentate line.  This is just distal to the pouch anastomosis.  The tract itself is healthy in  appearance without any erythema or swelling.  There is no purulent drainage.  The left anterior fistula tract has been the culprit for prior abscesses.  This external opening is then carefully probed and 360 degrees.  We are able to identify an abscess extending posteriorly.  This cavity is then opened.  It is approximately 2 x 2 cm.  Chronic granulation tissue was then removed using a curette and then cauterized.  There is no evident communication with other tracts tunneling throughout the buttock.  There is also no identifiable additional fistula tracts.  The perineum and each respective gluteus are carefully inspected.  Although there is chronic scarring, there is no evident external opening to fistula or draining wounds.  There is no palpable fluctuance or erythema.  The anal canal was then inspected.  Hemostasis is verified.  The perineum was washed and dried.  A dressing consisting of 4 x 4's, ABD, and mesh underwear was ultimately placed.  He was taken out of lithotomy position, awakened from anesthesia, extubated, and transferred to a stretcher for transport to recovery in satisfactory condition.  DISPOSITION: PACU in satisfactory condition.

## 2023-07-03 NOTE — Anesthesia Procedure Notes (Signed)
Procedure Name: LMA Insertion Date/Time: 07/03/2023 4:24 PM  Performed by: Elyn Peers, CRNAPre-anesthesia Checklist: Patient identified, Emergency Drugs available, Suction available, Patient being monitored and Timeout performed Patient Re-evaluated:Patient Re-evaluated prior to induction Oxygen Delivery Method: Circle system utilized Preoxygenation: Pre-oxygenation with 100% oxygen Induction Type: IV induction Ventilation: Mask ventilation without difficulty LMA: LMA inserted and LMA with gastric port inserted LMA Size: 5.0 Number of attempts: 1 Placement Confirmation: positive ETCO2 and breath sounds checked- equal and bilateral Tube secured with: Tape Dental Injury: Teeth and Oropharynx as per pre-operative assessment

## 2023-07-03 NOTE — Discharge Instructions (Addendum)
ANORECTAL SURGERY: POST OP INSTRUCTIONS  DIET: Follow a light bland diet the first 24 hours after arrival home, such as soup, liquids, crackers, etc.  Be sure to include lots of fluids daily.  Avoid fast food or heavy meals as your are more likely to get nauseated.  Eat a low fat diet the next few days after surgery.   Some bleeding with bowel movements is expected for the first couple of days but this should stop in between bowel movements  Take your usually prescribed home medications unless otherwise directed. No foreign bodies per rectum for the next 3 months (enemas, etc)  PAIN CONTROL: It is helpful to take an over-the-counter pain medication regularly for the first few days/weeks.  Choose from the following that works best for you: Ibuprofen (Advil, etc) Three 200mg tabs every 6 hours as needed. Acetaminophen (Tylenol, etc) 500-650mg every 6 hours as needed NOTE: You may take both of these medications together - most patients find it most helpful when alternating between the two (i.e. Ibuprofen at 6am, tylenol at 9am, ibuprofen at 12pm ...) A  prescription for pain medication may have been prescribed for you at discharge.  Take your pain medication as prescribed.  If you are having problems/concerns with the prescription medicine, please call us for further advice.  Avoid getting constipated.  Between the surgery and the pain medications, it is common to experience some constipation.  Increasing fluid intake (64oz of water per day) and taking a fiber supplement (such as Metamucil, Citrucel, FiberCon) 1-2 times a day regularly will usually help prevent this problem from occurring.  Take Miralax (over the counter) 1-2x/day while taking a narcotic pain medication. If no bowel movement after 48hours, you may additionally take a laxative like a bottle of Milk of Magnesia which can be purchased over the counter. Avoid enemas.   Watch out for diarrhea.  If you have many loose bowel movements,  simplify your diet to bland foods.  Stop any stool softeners and decrease your fiber supplement. If this worsens or does not improve, please call us.  Wash / shower every day.  If you were discharged with a dressing, you may remove this the day after your surgery. You may shower normally, getting soap/water on your wound, particularly after bowel movements.  Soaking in a warm bath filled a couple inches ("Sitz bath") is a great way to clean the area after a bowel movement and many patients find it is a way to soothe the area.  ACTIVITIES as tolerated:   You may resume regular (light) daily activities beginning the next day--such as daily self-care, walking, climbing stairs--gradually increasing activities as tolerated.  If you can walk 30 minutes without difficulty, it is safe to try more intense activity such as jogging, treadmill, bicycling, low-impact aerobics, etc. Refrain from any heavy lifting or straining for the first 2 weeks after your procedure, particularly if your surgery was for hemorrhoids. Avoid activities that make your pain worse You may drive when you are no longer taking prescription pain medication, you can comfortably wear a seatbelt, and you can safely maneuver your car and apply brakes.  FOLLOW UP in our office Please call [facility] at [phone] to set up an appointment to see your surgeon in the office for a follow-up appointment approximately 2 weeks after your surgery. Make sure that you call for this appointment the day you arrive home to insure a convenient appointment time.  9. If you have disability or family leave forms   that need to be completed, you may have them completed by your primary care physician's office; for return to work instructions, please ask our office staff and they will be happy to assist you in obtaining this documentation   When to call us [phone]: Poor pain control Reactions / problems with new medications (rash/itching, etc)  Fever over  101.5 F (38.5 C) Inability to urinate Nausea/vomiting Worsening swelling or bruising Continued bleeding from incision. Increased pain, redness, or drainage from the incision  The clinic staff is available to answer your questions during regular business hours (8:30am-5pm).  Please don't hesitate to call and ask to speak to one of our nurses for clinical concerns.   A surgeon from [facility] is always on call at the hospitals   If you have a medical emergency, go to the nearest emergency room or call 911.   [facility] [address], [address], [place], [place]  [address] MAIN: [phone] FAX: [phone] [email or web address] 

## 2023-07-03 NOTE — Anesthesia Postprocedure Evaluation (Signed)
Anesthesia Post Note  Patient: Sean Schaefer  Procedure(s) Performed: PLACEMENT OF DRAINING SETONS SURGICAL TREATMENT OF TRANSSPHINCTERIC ANAL FISTULAS ANORECTAL EXAM UNDER ANESTHESIA     Patient location during evaluation: PACU Anesthesia Type: General Level of consciousness: awake and alert Pain management: pain level controlled Vital Signs Assessment: post-procedure vital signs reviewed and stable Respiratory status: spontaneous breathing, nonlabored ventilation, respiratory function stable and patient connected to nasal cannula oxygen Cardiovascular status: blood pressure returned to baseline and stable Postop Assessment: no apparent nausea or vomiting Anesthetic complications: no   No notable events documented.  Last Vitals:  Vitals:   07/03/23 1730 07/03/23 1745  BP: 111/83 121/76  Pulse: 75 69  Resp: (!) 24 15  Temp:  (!) 36.4 C  SpO2: 99% 99%    Last Pain:  Vitals:   07/03/23 1745  TempSrc:   PainSc: 0-No pain                 Chritopher Coster

## 2023-07-03 NOTE — H&P (Signed)
CC: Here today for surgery  HPI: Sean Schaefer is an 57 y.o. male HTN, previously carried the diagnosis of ulcerative colitis and underwent a total abdominal colectomy with ileostomy here in [place] with Dr. Mosetta Anis. He was subsequently referred to the [facility] in [facility] and under the care of Dr. Lynne Logan underwent creation of an ileal J-pouch. Occurred ~1989. In the years after surgery he has been dealing with recurrent perianal abscesses. He has had multiple incision and drainage procedures. He has also had multiple abscesses at home and he has been able to self express. He was recently seen in our urgent office and underwent repeat incision and drainage. He was referred to see me.   OR 02/05/21  1. I&D perianal abscess 2. Placement of draining setons x2 3. Pouchoscopy with biopsies  Pathology returned: A. J POUCH, BIOPSY:  - Superficial colonic mucosa with surface erosions, acute and chronic  inflammation  - No granulomas or malignancy identified   B. ANAL CANAL, BIOPSY:  - Superficial colonic mucosa with surface erosions, acute and chronic  inflammation  - No granulomas or malignancy identified   He developed another abscess in the left perianal/perirectal region. He was taken to the operating room by Dr. Bedelia Person, and underwent incision and drainage of this abscess with placement of an additional Penrose drain. This emanates from the external opening.   He is following with [facility], Dr. Myrtie Neither. Completed initiation of Remicide and is now on maintenance.  INTERVAL HX Returns for follow-up. He has 2 remaining setons. He did have a third that had become disrupted and fell out. He did have an incision/drainage of the left buttock by Puja previously. He now has some swelling in the right anterior position with fluctuance and pain concerning for abscess. Dr. Myrtie Neither placed him on Cipro and Flagyl which she has been taking orally for the last day.  He does  report that he recently switched off of Remicade and on Skyrizi back in June. He is due for another injection of that eminently. There were plans underway for him to see IBD GI specialist at [facility] for medical management purposes. He reports that due to out of network costs, he was unable to see this doctor. There are plans underway for him to see a local IBD specialist with the gastroenterology practice in town, one of Dr. Irving Burton new partner starting in January.  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 Janean Sark)  FHx: Denies any known family history of colorectal, breast, endometrial or ovarian cancer  Social Hx: Denies use of tobacco/EtOH/illicit drug. Works multiple jobs including jobs in Engineer, manufacturing. He was seen in the office with his wife whom works with [employer] in behavioral health   He denies any changes in health or health history since we met in the office. No new medications/allergies. He states he is ready for surgery today.  Past Medical History:  Diagnosis Date   CAD (coronary artery disease)    Crohn's disease (HCC)    Diabetes mellitus (HCC)    Dilation of aorta (HCC)    Hypertension    Myocardial infarction (HCC)    Ulcerative colitis (HCC)     Past Surgical History:  Procedure Laterality Date   BIOPSY  12/12/2022   Procedure: BIOPSY;  Surgeon: Benancio Deeds, MD;  Location: [facility] ENDOSCOPY;  Service: Gastroenterology;;   COLON SURGERY     COLONOSCOPY  CORONARY BALLOON ANGIOPLASTY N/A 10/20/2022   Procedure: CORONARY BALLOON ANGIOPLASTY;  Surgeon: Marykay Lex, MD;  Location: [facility] INVASIVE CV LAB;  Service: Cardiovascular;  Laterality: N/A;   CORONARY STENT INTERVENTION N/A 10/20/2022   Procedure: CORONARY STENT INTERVENTION;  Surgeon: Marykay Lex, MD;  Location: [facility] INVASIVE CV LAB;  Service: Cardiovascular;  Laterality: N/A;   ESOPHAGOGASTRODUODENOSCOPY (EGD) WITH PROPOFOL N/A 12/12/2022    Procedure: ESOPHAGOGASTRODUODENOSCOPY (EGD) WITH PROPOFOL;  Surgeon: Benancio Deeds, MD;  Location: [facility] ENDOSCOPY;  Service: Gastroenterology;  Laterality: N/A;   EYE SURGERY Left    FLEXIBLE SIGMOIDOSCOPY N/A 02/05/2021   Procedure: FLEXIBLE POUCHOSCOPY WITH BIOPSY;  Surgeon: Andria Meuse, MD;  Location: [facility] ORS;  Service: General;  Laterality: N/A;   INCISION AND DRAINAGE ABSCESS N/A 02/05/2021   Procedure: INCISION AND DRAINAGE OF PERIANAL ABSCESS;  Surgeon: Andria Meuse, MD;  Location: [facility] ORS;  Service: General;  Laterality: N/A;   INCISION AND DRAINAGE ABSCESS N/A 12/21/2022   Procedure: INCISION AND DRAINAGE ABSCESS;  Surgeon: Berna Bue, MD;  Location: [facility] OR;  Service: General;  Laterality: N/A;   INCISION AND DRAINAGE ABSCESS Left 03/13/2023   Procedure: INCISION AND DRAINAGE ABSCESS;  Surgeon: Manus Rudd, MD;  Location: [facility] ORS;  Service: General;  Laterality: Left;   INCISION AND DRAINAGE PERIRECTAL ABSCESS N/A 11/30/2020   Procedure: IRRIGATION AND DEBRIDEMENT PERIRECTAL ABSCESS;  Surgeon: Fritzi Mandes, MD;  Location: [facility] OR;  Service: General;  Laterality: N/A;   INCISION AND DRAINAGE PERIRECTAL ABSCESS Left 04/03/2021   Procedure: left peri-rectal/gluteal abscess;  Surgeon: Diamantina Monks, MD;  Location: [facility] OR;  Service: General;  Laterality: Left;   LEFT HEART CATH AND CORONARY ANGIOGRAPHY N/A 10/20/2022   Procedure: LEFT HEART CATH AND CORONARY ANGIOGRAPHY;  Surgeon: Marykay Lex, MD;  Location[facility] INVASIVE CV LAB;  Service: Cardiovascular;  Laterality: N/A;   Perianal fistula repair  07/22/2010   PLACEMENT OF SETON N/A 02/05/2021   Procedure: PLACEMENT OF SETONS X2;  Surgeon: Andria Meuse, MD;  Location: [facility] ORS;  Service: General;  Laterality: N/A;   RECTAL EXAM UNDER ANESTHESIA N/A 02/05/2021   Procedure: ANORECTAL EXAM UNDER ANESTHESIA;  Surgeon: Andria Meuse, MD;  Location: [facility] ORS;  Service: General;  Laterality: N/A;    TESTICLE REMOVAL Left 2000    Family History  Problem Relation Age of Onset   Hypertension Mother    Cancer Mother    Hypertension Father    Inflammatory bowel disease Brother    Diabetes Paternal Uncle    Diabetes Paternal Grandmother    Diabetes Paternal Grandfather    Heart disease Neg Hx    Colon cancer Neg Hx    Esophageal cancer Neg Hx    Rectal cancer Neg Hx    Stomach cancer Neg Hx     Social:  reports that he has never smoked. He has never used smokeless tobacco. He reports that he does not drink alcohol and does not use drugs.  Allergies:  Allergies  Allergen Reactions   Statins Other (See Comments)    Myalgias, tolerates rosuvastatin      Medications: I have reviewed the patient's current medications.  Results for orders placed or performed during the hospital encounter of 07/03/23 (from the past 48 hours)  Glucose, capillary     Status: Abnormal   Collection Time: 07/03/23  2:08 PM  Result Value Ref Range   Glucose-Capillary 260 (H) 70 - 99 mg/dL    Comment: Glucose reference  range applies only to samples taken after fasting for at least 8 hours.   Comment 1 Notify RN    Comment 2 Document in Chart     No results found.   PE Blood pressure 134/84, pulse 65, temperature 97.9 F (36.6 C), temperature source Oral, resp. rate 18, SpO2 99%. Constitutional: NAD; conversant Eyes: Moist conjunctiva; no lid lag; anicteric Lungs: Normal respiratory effort CV: RRR Psychiatric: Appropriate affect  Results for orders placed or performed during the hospital encounter of 07/03/23 (from the past 48 hours)  Glucose, capillary     Status: Abnormal   Collection Time: 07/03/23  2:08 PM  Result Value Ref Range   Glucose-Capillary 260 (H) 70 - 99 mg/dL    Comment: Glucose reference range applies only to samples taken after fasting for at least 8 hours.   Comment 1 Notify RN    Comment 2 Document in Chart     No results found.  A/P: Sean Schaefer is an 57 y.o.  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 Myrtie Neither following - appreciate their assistance in his care. Off of Remicade, now on Skyrizi. We have again spent time today discussing fecal diversion and/or pouch excision as a viable option which would certainly help with his perianal disease. I think this would certainly be a option that should be given strong consideration particularly if his disease is not responsive to treatment escalations. He remains pretty averse to undergoing any sort of surgery for this problem that would result in an ostomy that would almost certainly be permanent in his case. We spent time discussing potential quality of life gains should this be necessary with regards to achieving a much better degree of control of his perianal disease.   -Given the above, I think he would benefit from additional seton placement. We also discussed replacing his pre-existing setons. We have reviewed options going forward including further observation vs surgery -anorectal exam under anesthesia with surgical treatment of transsphincteric anal fistula with placement and replacement of draining setons. Possible incision/drainage of any perianal abscesses. -The planned procedure, material risks (including, but not limited to, pain, bleeding, infection, scarring, need for blood transfusion, damage to anal sphincter, incontinence of gas and/or stool, need for additional procedures, anal stenosis, rare cases of pelvic sepsis which in severe cases may require things like a colostomy, recurrence, pneumonia, heart attack, stroke, death) benefits and alternatives to surgery were discussed  at length. I noted a good probability that the procedure would help improve their symptoms. The patient's questions were answered to his satisfaction, he voiced understanding and elected to proceed with surgery. Additionally, we discussed typical postoperative expectations and the recovery process.    Marin Olp, MD [facility]

## 2023-07-04 ENCOUNTER — Encounter (HOSPITAL_COMMUNITY): Payer: Self-pay | Admitting: Surgery

## 2023-07-04 ENCOUNTER — Other Ambulatory Visit: Payer: Self-pay

## 2023-07-11 ENCOUNTER — Other Ambulatory Visit: Payer: Self-pay

## 2023-07-14 ENCOUNTER — Other Ambulatory Visit: Payer: Self-pay

## 2023-07-18 ENCOUNTER — Other Ambulatory Visit: Payer: Self-pay

## 2023-07-21 ENCOUNTER — Other Ambulatory Visit: Payer: Self-pay

## 2023-07-21 ENCOUNTER — Telehealth: Payer: Self-pay | Admitting: Pharmacy Technician

## 2023-07-21 ENCOUNTER — Other Ambulatory Visit (HOSPITAL_COMMUNITY): Payer: Self-pay

## 2023-07-21 NOTE — Telephone Encounter (Signed)
Pharmacy Patient Advocate Encounter   Received notification from Patient Pharmacy that prior authorization for Sean Schaefer is required/requested.   Insurance verification completed.   The patient is insured through [facility] .   Per test claim: PA required; PA submitted to above mentioned insurance via CoverMyMeds Key/confirmation #/EOC Key: [id number] Status is pending

## 2023-07-21 NOTE — Progress Notes (Signed)
Specialty Pharmacy Refill Coordination Note  Sean Schaefer is a 57 y.o. male contacted today regarding refills of specialty medication(s) [name])   Patient requested [name] at [facility] at [place] date: 07/22/23   Medication will be filled on 07/21/23. *Pending PA* Patient is aware.

## 2023-07-22 ENCOUNTER — Other Ambulatory Visit: Payer: Self-pay

## 2023-07-22 ENCOUNTER — Other Ambulatory Visit (HOSPITAL_COMMUNITY): Payer: Self-pay

## 2023-07-22 NOTE — Telephone Encounter (Signed)
 Pharmacy Patient Advocate Encounter  Received notification from [employer] that Prior Authorization for Skyrizi  has been APPROVED from 07/21/23 to 07/19/24. Spoke to pharmacy to process.Copay is $0.00 with copay card.    PA #/Case ID/Reference #: (Key: [id number]) PA Case ID #: [id number]

## 2023-07-29 ENCOUNTER — Other Ambulatory Visit (HOSPITAL_COMMUNITY): Payer: Self-pay

## 2023-07-29 MED ORDER — AMOXICILLIN-POT CLAVULANATE 875-125 MG PO TABS
1.0000 | ORAL_TABLET | Freq: Two times a day (BID) | ORAL | 0 refills | Status: DC
  Filled 2023-07-29: qty 14, 7d supply, fill #0

## 2023-08-03 NOTE — Progress Notes (Signed)
 [facility] Return Visit   Referring Provider Rexanne Ingle, MD [address] [place],  [place] [address]  Primary Care Provider Rexanne Ingle, MD  Patient Profile: Sean Schaefer is a 58 y.o. male with a past medical history noteworthy for CAD, NSTEMI 09/2022 s/p PIC and DES (on ticagrelor ), HTN, hyperlipidemia, T2DM who returns to the [facility] for follow-up of the problem(s) noted below.  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 Legrand.  Dr. Legrand requested my opinion regarding management of Sean Schaefer fistulizing Crohn's disease of IPAA placated by perianal abscesses status post multiple EUA's with I&D's and seton placement   Current GI Meds  Skyrizi  180 mg SQ Q 8 weeks (Induction 600 mg 01/20/2023) Augmentin  Pantoprazole  40 mg twice daily Ondansetron  4 mg p.o. every 8 hours as needed for nausea  Interval History  Sean Schaefer is accompanied to the office today by his wife who provides history in conjunction with him.  In speaking with Sean Schaefer as well as reviewing his prior medical records, his IBD history is noteworthy for the following:  1988 - Diagnosed with UC after onset of diarrhea and rectal bleeding with rapid progression and refractory course (? Steroids, 5-ASA) [id number] - 3 stage TAPC + IPAA (TAC + IE, Dr. Queen in [place]; IPAA, Dr. Dwaine at [facility]) Reports doing well throughout the 1990s with satisfactory pouch function Late 1990's/early 2000's - evolution of perianal fistulous and recurrent perianal abscesses - no IBD tx 2012 - EUA for perirectal abscesses; at least 3 fistula tracts curetted and fulgurated 2015 - Saw Dr. Kristie and tx'd w/ Humira  x 8 months  without benefit (dosing/levels unknown), reports feeling poorly 2022 - Established care with [facility]            EUA x 3 (11/2020, 01/2021, 03/2021) -multiple transfer enteric fistulous status post seton placement; fistulous disease distal to anastomosis            Initiated IFX 5 mg/kg  Q4 weeks--> attempted titration to 10 mg/kg which resulted in visual changes and decreased back to 5 mg/kg 2023 - Combination therapyw/ IFX 5 mg/kg + AZA; TDM IFX 1.7, Ab 0 2024 - Induction Skyrizi  01/2023            Developed complex abscess involving left hip during hospitalization 02/2023            EUA x 3 (12/2022, 02/2023, 06/2023) - I&Ds with seton placement x 2  At today's visit, Sean Schaefer informs me that he recently developed recurrent abscesses and underwent another procedure for drainage of abscesses last week Currently on Augmentin , well and denies recurrent abscess since last procedure  He is maintained on Skyrizi  180 mg subcutaneously q. 8 weeks Reports that he took his last dose at a 6-week interval due to active symptoms He does not feel that he is seen a significant change in his symptoms since initiation of Skyrizi   Currently having up to 5 loose bowel movements a day Denies fecal urgency, tenesmus Reports seeing blood which he feels is emanating from his fistulas and not necessarily from his rectal cuff No abdominal pain or cramping No upper GI tract symptoms of GERD, nausea, vomiting, dysphagia or odynophagia  Sean Schaefer denies extraintestinal  manifestations of fevers, chills, joint pain, skin rashes or oral ulcers  Weight, energy and appetite are overall stable  With regard to other health issues, Sean Schaefer sustained an NSTEMI 09/2022 status post DES.  Currently stable without chest pain, dyspnea on exertion.  At today's visit we discussed the impact of his severe perianal fistulizing disease on his quality of life with options for medical and surgical management   Last pouchoscopy: 12/2021-2  setons in distal anal canal, excavated lesions in mid anal canal (? Fistulas), no inflammation in pouch Last endoscopy: 11/2022-LA grade a esophagitis, HH  Last Abd CT/CTE/MRE: 02/2023 -soft tissue thickening, air and fluid in presacral space, multiple setons in anal region with fluid collection along the ischial anal fossa, new area of stranding and thickening along the left hemipelvis and left hip   Inflammatory Bowel Disease History  1988 - Diagnosed with UC after onset of diarrhea and rectal bleeding with rapid progression and refractory course (? Steroids, 5-ASA) [id number] - 3 stage TAPC + IPAA (TAC + IE, Dr. Queen in [place]; IPAA, Dr. Dwaine at [facility]) Reports doing well throughout the 1990s with satisfactory pouch function Late 1990's/early 2000's - evolution of perianal fistulous and recurrent perianal abscesses - no IBD tx 2012 - EUA for perirectal abscesses; at least 3 fistula tracts curetted and fulgurated 2015 - Saw Dr. Kristie and tx'd w/ Humira  x 8 months without benefit (dosing/levels unknown), reports feeling poorly 2022 - Established care with [facility]            EUA x 3 (11/2020, 01/2021, 03/2021) -multiple transphincteric fistulas status post seton placement; fistulous disease distal to anastomosis            Initiated IFX 5 mg/kg  Q4 weeks--> attempted titration to 10 mg/kg which resulted in visual changes and decreased back to 5 mg/kg 2023 - Combination therapyw/ IFX 5 mg/kg + AZA; TDM IFX 1.7, Ab 0 2024 - On IFX 5 mg/kg Q4 weeks + AZA TDM IFX 23, Ab 0            Induction Skyrizi  01/2023            Developed complex abscess involving right hip during hospitalization 02/2023            EUA x 3 (12/2022, 02/2023, 06/2023) - I&Ds with seton placement x 2  GI Review of Symptoms Significant for perianal discomfort, bloody drainage from fistulous. Otherwise negative.  General Review of Systems  Review of systems is significant for the pertinent positives and negatives as listed per the  HPI.  Full ROS is otherwise negative.  Past Medical History   Past Medical History:  Diagnosis Date  . CAD (coronary artery disease)   . Crohn's disease (HCC)   . Diabetes mellitus (HCC)   . Dilation of aorta (HCC)   . Hypertension   . Myocardial infarction (HCC)   . Ulcerative colitis [facility])       Past Surgical History   Past Surgical History:  Procedure Laterality Date  . ANAL FISTULOTOMY N/A 07/03/2023   Procedure: SURGICAL TREATMENT OF TRANSSPHINCTERIC ANAL FISTULAS;  Surgeon: Teresa Lonni HERO, MD;  Location: [facility] ORS;  Service: General;  Laterality: N/A;  . BIOPSY  12/12/2022   Procedure: BIOPSY;  Surgeon: Leigh Elspeth SQUIBB, MD;  Location: [facility] ENDOSCOPY;  Service: Gastroenterology;;  . COLON SURGERY    . COLONOSCOPY    . CORONARY BALLOON ANGIOPLASTY N/A 10/20/2022   Procedure: CORONARY BALLOON ANGIOPLASTY;  Surgeon: Anner Alm ORN, MD;  Location: [facility] INVASIVE CV LAB;  Service: Cardiovascular;  Laterality: N/A;  . CORONARY STENT INTERVENTION N/A 10/20/2022   Procedure: CORONARY STENT INTERVENTION;  Surgeon: Anner Alm ORN, MD;  Location: [facility] INVASIVE CV LAB;  Service: Cardiovascular;  Laterality: N/A;  . ESOPHAGOGASTRODUODENOSCOPY (EGD) WITH PROPOFOL  N/A 12/12/2022   Procedure: ESOPHAGOGASTRODUODENOSCOPY (EGD) WITH PROPOFOL ;  Surgeon: Leigh Elspeth SQUIBB, MD;  Location: [facility] ENDOSCOPY;  Service: Gastroenterology;  Laterality: N/A;  . EYE SURGERY Left   . FLEXIBLE SIGMOIDOSCOPY N/A 02/05/2021   Procedure: FLEXIBLE POUCHOSCOPY WITH BIOPSY;  Surgeon: Teresa Lonni HERO, MD;  Location: [facility];  Service: General;  Laterality: N/A;  . INCISION AND DRAINAGE ABSCESS N/A 02/05/2021   Procedure: INCISION AND DRAINAGE OF PERIANAL ABSCESS;  Surgeon: Teresa Lonni HERO, MD;  Location: [facility] OR[facility];  Service: General;  Laterality: N/A;  . INCISION AND DRAINAGE ABSCESS N/A 12/21/2022   Procedure: INCISION AND DRAINAGE ABSCESS;  Surgeon: Signe Mitzie LABOR, MD;  Location: [facility] OR;  Service:  General;  Laterality: N/A;  . INCISION AND DRAINAGE ABSCESS Left 03/13/2023   Procedure: INCISION AND DRAINAGE ABSCESS;  Surgeon: Belinda Cough, MD;  Location: [facility];  Service: General;  Laterality: Left;  . INCISION AND DRAINAGE PERIRECTAL ABSCESS N/A 11/30/2020   Procedure: IRRIGATION AND DEBRIDEMENT PERIRECTAL ABSCESS;  Surgeon: Dasie Leonor CROME, MD;  Location: [facility] OR;  Service: General;  Laterality: N/A;  . INCISION AND DRAINAGE PERIRECTAL ABSCESS Left 04/03/2021   Procedure: left peri-rectal/gluteal abscess;  Surgeon: Paola Dreama SAILOR, MD;  Location: [facility] OR;  Service: General;  Laterality: Left;  . LEFT HEART CATH AND CORONARY ANGIOGRAPHY N/A 10/20/2022   Procedure: LEFT HEART CATH AND CORONARY ANGIOGRAPHY;  Surgeon: Anner Alm ORN, MD;  Location: [facility] INVASIVE CV LAB;  Service: Cardiovascular;  Laterality: N/A;  . Perianal fistula repair  07/22/2010  . PLACEMENT OF SETON N/A 02/05/2021   Procedure: PLACEMENT OF SETONS X2;  Surgeon: Teresa Lonni HERO, MD;  Location: [facility];  Service: General;  Laterality: N/A;  . PLACEMENT OF SETON N/A 07/03/2023   Procedure: PLACEMENT OF DRAINING SETONS;  Surgeon: Teresa Lonni HERO, MD;  Location: [facility];  Service: General;  Laterality: N/A;  . RECTAL EXAM UNDER ANESTHESIA N/A 02/05/2021   Procedure: ANORECTAL EXAM UNDER ANESTHESIA;  Surgeon: Teresa Lonni HERO, MD;  Location: [facility];  Service: General;  Laterality: N/A;  . RECTAL EXAM UNDER ANESTHESIA N/A 07/03/2023   Procedure: ANORECTAL EXAM UNDER ANESTHESIA;  Surgeon: Teresa Lonni HERO, MD;  Location: [facility];  Service: General;  Laterality: N/A;  . TESTICLE REMOVAL Left 2000      Allergies and Medications   Allergies  Allergen Reactions  . Statins Other (See Comments)    Myalgias, tolerates rosuvastatin       Current Meds  Medication Sig  . amoxicillin -clavulanate (AUGMENTIN ) 875-125 MG tablet Take 1 tablet by mouth every 12 (twelve) hours for 7 days.  . aspirin  EC 81 MG tablet Take  1 tablet (81 mg total) by mouth daily. Swallow whole. (Patient taking differently: Take 81 mg by mouth in the morning. Swallow whole.)  . carvedilol  (COREG ) 12.5 MG tablet Take 1 tablet (12.5 mg total) by mouth 2 (two) times daily with a meal.  . Continuous Glucose Sensor (FREESTYLE LIBRE 14 DAY SENSOR) MISC Apply 1 sensor to the back of upper arm every 14 days  . insulin  degludec (TRESIBA  FLEXTOUCH) 100 UNIT/ML FlexTouch Pen Inject 38 Units into the skin daily. (Patient taking differently: Inject 38 Units into the skin at bedtime.)  .  insulin  lispro (HUMALOG ) 100 UNIT/ML injection Inject 10 Units into the skin 3 (three) times daily with meals.  . methocarbamol  (ROBAXIN ) 500 MG tablet Take 1 tablet (500 mg total) by mouth every 6 (six) hours as needed.  . rosuvastatin  (CRESTOR ) 40 MG tablet Take 1 tablet (40 mg total) by mouth daily.  . ticagrelor  (BRILINTA ) 90 MG TABS tablet Take 1 tablet (90 mg) by mouth 2 times daily.  . [DISCONTINUED] Risankizumab -rzaa (SKYRIZI ) 180 MG/1.2ML SOCT Inject 1.2 mLs into the skin every 8 (eight) weeks.   Current Facility-Administered Medications for the 08/05/23 encounter (Office Visit) with Suzann Inocente HERO, MD  Medication  . Insulin  Pen Needle (NOVOFINE) 10 each     Family History   Family History  Problem Relation Age of Onset  . Hypertension Mother   . Cancer Mother   . Hypertension Father   . Inflammatory bowel disease Brother   . Diabetes Paternal Uncle   . Diabetes Paternal Grandmother   . Diabetes Paternal Grandfather   . Heart disease Neg Hx   . Colon cancer Neg Hx   . Esophageal cancer Neg Hx   . Rectal cancer Neg Hx   . Stomach cancer Neg Hx      Social History   Social History   Social History Narrative  . Not on file   [place] reports that he has never smoked. He has never used smokeless tobacco. He reports that he does not drink alcohol and does not use drugs.  Vital Signs and Physical Examination   Vitals:   08/05/23 1014  08/05/23 1100  BP: (!) 84/60 90/64  Pulse: 90   Height: 5' 9 (1.753 m)   Weight: 191 lb 2 oz (86.7 kg)   BMI (Calculated): 28.21     General: Well developed, well nourished, no acute distress Head: Normocephalic and atraumatic Eyes: Sclerae anicteric, EOMI Ears: Normal auditory acuity Mouth: No deformities or lesions noted Lungs: Clear throughout to auscultation Heart: Regular rate and rhythm; No murmurs, rubs or bruits Abdomen: Soft, non tender and non distended. No masses, hepatosplenomegaly, Normal Bowel sounds, well-healed abdominal scars Rectal: Deferred Musculoskeletal: Symmetrical with no gross deformities  Pulses:  Normal pulses noted Extremities: No edema or deformities noted Neurological: Alert oriented x 4, grossly nonfocal Psychological:  Alert and cooperative. Normal mood and affect   Review of Data  The following data was reviewed at the time of this encounter:  Laboratory Studies      Latest Ref Rng & Units 06/26/2023    9:38 AM 03/14/2023    4:13 AM 03/12/2023    4:24 PM  CBC  WBC 4.0 - 10.5 K/uL 6.2  11.8  10.6   Hemoglobin 13.0 - 17.0 g/dL 85.8  87.7  86.5   Hematocrit 39.0 - 52.0 % 42.1  38.1  40.5   Platelets 150 - 400 K/uL 181  145  170     Lab Results  Component Value Date   LIPASE 40 12/09/2022      Latest Ref Rng & Units 07/03/2023    2:30 PM 03/14/2023    4:13 AM 03/12/2023    4:24 PM  CMP  Glucose 70 - 99 mg/dL 731  784  703   BUN 6 - 20 mg/dL 14  27  18    Creatinine 0.61 - 1.24 mg/dL 8.65  8.37  8.42   Sodium 135 - 145 mmol/L 129  131  130   Potassium 3.5 - 5.1 mmol/L 4.5  4.5  4.2  Chloride 98 - 111 mmol/L 97  99  94   CO2 22 - 32 mmol/L 23  21  24    Calcium  8.9 - 10.3 mg/dL 9.3  8.2  9.4   Total Protein 6.5 - 8.1 g/dL  7.8  9.1   Total Bilirubin 0.3 - 1.2 mg/dL  1.0  1.4   Alkaline Phos 38 - 126 U/L  58  69   AST 15 - 41 U/L  15  14   ALT 0 - 44 U/L  12  13     IBD Labs  Prebiologic Labs 2022  Hepatitis B surface antigen  negative Hepatitis B surface antibody negative  Urine gold  Therapeutic Drug Monitoring 2022 TPMT normal  - 14 Thiopurine metabolite levels:  Date:                6-TGN       6-MMP  Biologic level and antibodies: Infliximab  07/2021   IFX  1.7, Ab 0 11/2022   IFX 23, Ab 0  Fecal Calprotectin   Imaging Studies  CTAP 03/12/2023 Surgical changes of previous subtotal colectomy with soft tissue thickening in the area of the surgical anastomosis in the low pelvis. There continues to be soft tissue thickening and some air with fluid extending into the presacral space, similar to previous.   Multiple setons in place along the anal region consistent with multiple fistula. The fluid collection along the ischioanal fossa and fat deep to the gluteal cleft on the left is improving with significant residual soft tissue thickening and stranding. No soft tissue gas in this location.   However there is a new area of stranding, skin thickening with soft tissue gas along the subcutaneous fat anterolateral along the left hemipelvis and anterior to the left hip. Please correlate for etiology including soft tissue injury or infection. No clear fistula track extends into the peritoneal space from this location. Please correlate with clinical history.   Gallstones.   CTAP 12/10/2022 1. Fluid collection extending from the left aspect of the rectum into the gluteal soft tissues, largest component measures at least 6.6 x 4.4 x 7 cm. Suspected seton in place. Component of the fluid collection extends along the seton in the left perineum extending to the base of the penis with small amount of gas. 2. Status post colectomy with ileal anal anastomosis. Mild wall thickening of the anorectal junction. 3. Distended urinary bladder extending superior to the umbilicus, recommend correlation for urinary retention. 4. Enlarged prostate causes mass effect on the bladder base. 5. Cholelithiasis without  gallbladder inflammation.   Aortic Atherosclerosis (ICD10-I70.0).  CTAP 12/09/2022 1. 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 2. Partially visualized perianal fistula.  No visible abscess. 3. Cholelithiasis without evidence of cholecystitis.  MRI Pelvis 12/31/2021 Multiple bilateral transsphincteric perianal fistulae, left side greater than right, as described above.   Two small abscesses in the left buttock subcutaneous tissues, measuring 4.1 cm and 1.6 cm, significantly decreased in size compared to prior CT.  MRE 12/14/2021 1. Suspect continued inflammation of the patient's ileal pouch, potentially extending into the presacral region. Area not well assessed currently and the patient is being recalled for dedicated imaging of the pelvis extending into the upper thigh to fully assessed the perineum. 2. No gross inflammation surrounding small bowel loops in the abdomen and no sign of obstruction. 3. No ascites.  CT Pelvis 04/03/2021 1. Large abscess within the medial aspect of the left buttock, likely arising  from a perianal fistula. Small extension of this abscess at of the perineum at the base of the penis as above. 2. Coiled radiopaque structure within the perineum likely reflects setons for treatment of known fistula. 3. Subtotal colectomy, with bowel wall thickening at the ileal pouch consistent with inflammatory bowel disease. No evidence of obstruction. 4.  Aortic Atherosclerosis (ICD10-I70.0).  CTE 11/26/2013 1. Status post subtotal colectomy with ileoanal anastomosis.  2. The previously questioned sinus tracts/fistula on the prior  contrast-enhanced CT or less apparent today, secondary to lack of  positive contrast. At least 1 probable sinus tract is again seen.  3. Wall thickening about the rectal pouch, favored to represent  active Crohn disease. Small sigmoid mesocolon nodes which are  favored to be  reactive. Consider direct endoscopic visualization of  the ileoanal anastomosis.  4. Soft tissue fullness about the left side of the ischial anal  fossa. Suboptimally evaluated by CT. Given the clinical history,  suspicious for perianal fistula. If this is a clinical concern, the  test of choice is pre and post contrast dedicated high-resolution  pelvic MRI.  5. Mild pelvic adenopathy, favored to be reactive.   CTAP 11/20/2023 Previous total colectomy with rectal pouch formation.   Irregular soft tissue thickening along the posterior wall of the  rectal pouch and involving the ileorectal anastomosis, with oral  contrast filling 1 or 2 small fistulous tracts in the presacral  region. This is suspicious for inflammatory bowel disease involving  the rectal pouch in surgical anastomosis, although carcinoma cannot  definitely be excluded. Consider proctoscopy for further evaluation.   Mild lymphadenopathy in the sigmoid mesocolon, which may be reactive  in etiology although metastatic disease cannot definitely be  excluded.   No evidence of abscess or bowel obstruction     GI Procedures and Studies  EGD 12/12/2022 LA Grade A reflux esophagitis, 1 cm HH Path: Normal gastric tissue, no HP  Pouchoscopy 12/20/2021 2 setons & penrose intact Within the proximal to mid anal canal, 2 excavated areas (?  Fistulous openings) are seen without drainage In the most distal anal canal, 2 setons were exiting a fistulous opening.  No drainage The ileoanal pouch and neoterminal ileum appeared normal.  No active inflammation.  EUA w/Pouchoscopy 02/05/2021 Anastomosis of pouch 2 cm from anal verge - 1-2 cm above the dentate line.  Left lateral perianal abscess and transsphincteric fistulas; anterior midline transsphincteric fistula to distal rectum/anal canal just distal to the pouch-anal anastomosis. These were controlled with blue vessel loop setons - unclear if they actually share an internal opening vs a  bridge between the two.  Healthy/normal appearing J pouch without significant inflammation; significant inflammation in the rectal cuff remnant/anal canal just distal to the anastomosis.   Pouchoscopy was performed and biopsies of both the J-pouch and the anal canal/distal rectum were obtained.  The fistulous disease appears distal to the anastomosis.  Path: J-pouch and anal canal -superficial colonic mucosa with surface erosions, acute and chronic inflammation   Clinical Impression  It is my clinical impression that Sean Schaefer is a 58 y.o. 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  He appears to have had rapid progression of disease and underwent 3 stage TAPC + IPAA (  TAC + IE, Dr. Queen in [place]; IPAA, Dr. Dwaine at [facility]) [phone].  He indicates that he fared well for approximately 10 years postoperatively with good pouch function and no other issues.  In the late 1990s/early 2000's he began noting the evolution of perianal fistulas and abscesses.  The development of a fistulizing perianal disease remote from previous surgery seems to be most indicative of a perianal Crohn's phenotype.  His perianal fistulizing disease was primarily managed surgically until 2015 when he was treated with Humira  x 8 months without benefit.   Over the last 3 years his disease has been addressed with a combined surgical and medical approach.  He has undergone multiple EUA's with I&D's, probing of his fistula tracts and adjustment of setons.  From a medical perspective, in 2022 he embarked on combination therapy with infliximab  5 mg/kg every 4 weeks.  Attempts to further dose escalate to 10 mg/kg are limited by  perceived medication side effects with visual changes shortly after receiving a higher dose of anti-TNF therapy resulting in reduction back to 5 mg/kg every 4 weeks.  Azathioprine  was later added to infliximab  and he was documented to achieve therapeutic drug levels and 2024 but had ongoing evidence of active disease.  As such, Sean Schaefer was transitioned to Skyrizi  01/2023.  Shortly after initiation of Skyrizi  he developed a gluteal abscess with extension to the left hip requiring hospitalization.  Over the last several months, Sean Schaefer has been receiving Skyrizi  180 mg subcutaneously every 8 weeks.  He notes recurrent fistulas as well as bloody material in his perianal region which he believes is emanating from his fistulas and not his rectal cuff.  This prompted him to take his last dose at a 6-week interval.    At today's visit we discussed that perianal fistulizing Crohn's disease typically best response to dose optimize biologic treatments.  I have recommended further dose optimization of his Skyrizi  to 360 mg at least every 8 weeks.  If this is ineffective can consider insurance appeal for shorter dosing interval of 6 or 4 weeks.  We reviewed that perianal fistulizing Crohn's phenotypes are typically best managed with combination anti-TNF+ IM therapy, anti-IL 12/23 agents and Jak inhibitors.  There is growing experience with combination of anti-IL 12/23 agents in conjunction with Jak inhibitors.  Sean Schaefer unfortunately incurred an NSTEMI almost 1 year ago and caution would need to be taken in light of this were Jak inhibitor therapy to be considered.  If there was consideration of transitioning to a Jak inhibitor or combining with Skyrizi  would advise multidisciplinary discussion involving his cardiologist.  The possibility of surgical diversion has been broached with him by Danis and his surgeon.  I discussed with Sean Schaefer that surgical diversion may be beneficial in an effort to: 1) divert fecal  stream to permit healing of his perianal region and 2) to offer time for additional therapies to come to market that could ameliorate his fistulizing phenotype before considering complete pouch excision.  Hyperbaric oxygen therapy has also been utilized in small case series for patients with severe perianal fistulizing Crohn's disease.  This could be a consideration if fistulizing disease persisted despite surgical diversion.  Plan  Increase Skyrizi  to 360 mg subcutaneously every 8 weeks; if ineffective can petition insurance for consideration of Q6 for 4-week dosing Routine immune suppressant monitoring labs q. 4 to 6 months: CBC, CMP, ESR, CRP Recommend annual nutritional panel for patients with small bowel Crohn's disease: Iron studies, vitamin B12, folate,  vitamin D  Continue current course of Augmentin  for recent abscess - query if he may benefit from longer-term chronic suppressive antibiotic treatment (ie; cipro /flagyl ) Continue ongoing multi disciplinary care in conjunction with colorectal surgery - diversion remains a viable option in the future if medical options are exhausted Monitor weight and anthropometrics   IBD Health Maintenance  Vaccinations Influenza: Pneumococcal: COVID19: HAV: HAV AB non-reactive (2022) HBV: HBV Ab non-reactive (2022) - recommend vaccinatation if not done Shingles: HPV:  DEXA PRN  Eye Exam PRN   Skin Exam Recommend annual skin exam on immune suppression  Surveillance Pouchoscopy Surveillance pouchoscopy ~ Q 3 years - next due 2026  Tobacco Use   Depression Screen    Planned Follow Up Sean Schaefer. Almond will return to Dr. Legrand' care.  I am happy to collaborate in his care again in the future should any new issues arise.  The patient or caregiver verbalized understanding of the material covered, with no barriers to understanding. All questions were answered. Patient or caregiver is agreeable with the plan outlined above.    It was a pleasure to see Shawnee.   If you have any questions or concerns regarding this evaluation, do not hesitate to contact me.  Inocente Hausen, MD [facility]   I spent total of 45  minutes in both face-to-face and non-face-to-face activities, excluding procedures performed, for the visit on the date of this encounter.

## 2023-08-05 ENCOUNTER — Ambulatory Visit: Payer: Commercial Managed Care - PPO | Admitting: Pediatrics

## 2023-08-05 ENCOUNTER — Encounter: Payer: Self-pay | Admitting: Pediatrics

## 2023-08-05 VITALS — BP 90/64 | HR 90 | Ht 69.0 in | Wt 191.1 lb

## 2023-08-05 DIAGNOSIS — Z862 Personal history of diseases of the blood and blood-forming organs and certain disorders involving the immune mechanism: Secondary | ICD-10-CM | POA: Diagnosis not present

## 2023-08-05 DIAGNOSIS — K2100 Gastro-esophageal reflux disease with esophagitis, without bleeding: Secondary | ICD-10-CM | POA: Diagnosis not present

## 2023-08-05 DIAGNOSIS — K50813 Crohn's disease of both small and large intestine with fistula: Secondary | ICD-10-CM

## 2023-08-05 DIAGNOSIS — Z7960 Long term (current) use of unspecified immunomodulators and immunosuppressants: Secondary | ICD-10-CM | POA: Diagnosis not present

## 2023-08-05 NOTE — Patient Instructions (Addendum)
 _______________________________________________________  If your blood pressure at your visit was 140/90 or greater, please contact your primary care physician to follow up on this. _______________________________________________________  If you are age 56 or younger, your body mass index should be between 19-25. Your Body mass index is 28.22 kg/m. If this is out of the aformentioned range listed, please consider follow up with your Primary Care Provider.   ________________________________________________________  The [facility] providers would like to encourage you to use MYCHART to communicate with providers for non-urgent requests or questions.  Due to long hold times on the telephone, sending your provider a message by [facility] may be a faster and more efficient way to get a response.  Please allow 48 business hours for a response.  Please remember that this is for non-urgent requests.  _______________________________________________________  Sean Schaefer will follow up in our office on an as needed basis.  Thank you for entrusting me with your care and choosing [facility].  Dr Suzann

## 2023-08-06 ENCOUNTER — Other Ambulatory Visit: Payer: Self-pay

## 2023-08-06 ENCOUNTER — Telehealth: Payer: Self-pay | Admitting: Gastroenterology

## 2023-08-06 ENCOUNTER — Other Ambulatory Visit: Payer: Self-pay | Admitting: Pharmacist

## 2023-08-06 ENCOUNTER — Other Ambulatory Visit (HOSPITAL_COMMUNITY): Payer: Self-pay

## 2023-08-06 DIAGNOSIS — K50813 Crohn's disease of both small and large intestine with fistula: Secondary | ICD-10-CM

## 2023-08-06 MED ORDER — SKYRIZI 360 MG/2.4ML ~~LOC~~ SOCT
2.4000 mL | SUBCUTANEOUS | 4 refills | Status: DC
  Filled 2023-08-06: qty 2.4, fill #0

## 2023-08-06 MED ORDER — SKYRIZI 360 MG/2.4ML ~~LOC~~ SOCT
2.4000 mL | SUBCUTANEOUS | 4 refills | Status: DC
  Filled 2023-08-06: qty 2.4, fill #0
  Filled 2023-09-04 – 2023-09-09 (×2): qty 2.4, 56d supply, fill #0
  Filled 2023-10-31: qty 2.4, 56d supply, fill #1

## 2023-08-06 NOTE — Telephone Encounter (Signed)
 PA team, please initiate PA for Skyrizi  360 mg every 8 weeks. Prescription has been sent to [facility]. Thanks

## 2023-08-06 NOTE — Progress Notes (Signed)
 Please see OV from 01/2023 for complete documentation.   Marene Shape, PharmD, Becky Bowels, CPP Clinical Pharmacist [facility] [phone]

## 2023-08-06 NOTE — Telephone Encounter (Signed)
 Sean Schaefer is a Crohn's disease patient of mine, and he was seen earlier this week by Dr. Yvone Herd for another opinion on his medication management.  She recommended increasing his Skyrizi  dose from 180 mg every 8 weeks to 360 mg every 8 weeks.  I suspect he will need a new insurance authorization/prescription for that.   Once that dose has been approved, he can start that if it has been at least 4 weeks since his most recent injection.  Please attend to that and let me know if you have any questions or problems.  He also needs a follow-up clinic appointment to see me in late March or early April.  H Danis

## 2023-08-08 ENCOUNTER — Other Ambulatory Visit (HOSPITAL_COMMUNITY): Payer: Self-pay

## 2023-08-10 ENCOUNTER — Encounter: Payer: Self-pay | Admitting: Pediatrics

## 2023-08-12 ENCOUNTER — Telehealth: Payer: Self-pay | Admitting: Pharmacy Technician

## 2023-08-12 ENCOUNTER — Other Ambulatory Visit (HOSPITAL_COMMUNITY): Payer: Self-pay

## 2023-08-12 NOTE — Telephone Encounter (Signed)
Pharmacy Patient Advocate Encounter   Received notification from [facility] that Prior Authorization for SKYRIZI 360MG  has been APPROVED from 1.21.25 to 7.20.25. Ran test claim, Copay is $0. This test claim was processed through [facility]- copay amounts may vary at other pharmacies due to pharmacy/plan contracts, or as the patient moves through the different stages of their insurance plan.    PA #/Case ID/Reference #: [id number]

## 2023-08-12 NOTE — Telephone Encounter (Signed)
Pharmacy Patient Advocate Encounter   Received notification from Physician's Office that prior authorization for [facility] 360MG  is required/requested.   Insurance verification completed.   The patient is insured through [facility] .   Per test claim: PA required; PA started via CoverMyMeds. KEY [id number] . Waiting for clinical questions to populate.

## 2023-08-13 NOTE — Telephone Encounter (Signed)
Pt returned call. Pt is aware of insurance approval for Skyrizi 360 mg every 8 weeks, he knows to wait at least 4 weeks after his last injection before starting increased dose. Pt has been advised of follow up appt. Pt verbalized understanding of all information and had no concerns at the end of the call.

## 2023-08-13 NOTE — Telephone Encounter (Signed)
Pharmacy Patient Advocate Encounter   Received notification from [facility] that Prior Authorization for SKYRIZI 360MG  has been APPROVED from 1.21.25 to 7.20.25. Ran test claim, Copay is $0. This test claim was processed through [facility]- copay amounts may vary at other pharmacies due to pharmacy/plan contracts, or as the patient moves through the different stages of their insurance plan.    PA #/Case ID/Reference #: [id number]

## 2023-08-13 NOTE — Telephone Encounter (Signed)
Lm on vm for patient to return call.   Patient has been scheduled for a follow up with Dr. Myrtie Neither on Thursday, 10/23/23 at 9:30 am.

## 2023-08-13 NOTE — Telephone Encounter (Signed)
Noted, thank you

## 2023-08-15 ENCOUNTER — Encounter: Payer: Self-pay | Admitting: Pediatrics

## 2023-08-15 DIAGNOSIS — Z7960 Long term (current) use of unspecified immunomodulators and immunosuppressants: Secondary | ICD-10-CM

## 2023-08-15 DIAGNOSIS — K50813 Crohn's disease of both small and large intestine with fistula: Secondary | ICD-10-CM

## 2023-08-15 NOTE — Telephone Encounter (Signed)
Dr McGreal/Dr Myrtie Neither-  Please advise specifically which labs you would like on the patient. CBC, CMP and GGT? Or are there others I should include?  Thank you!

## 2023-08-15 NOTE — Telephone Encounter (Signed)
CBC  CMP  ESR  CRP  GGT  Iron  Ferritin  TIBC  Folate  Vitamin B12  25 OH vitamin D  Zinc  TSH

## 2023-08-18 ENCOUNTER — Other Ambulatory Visit (INDEPENDENT_AMBULATORY_CARE_PROVIDER_SITE_OTHER): Payer: Commercial Managed Care - PPO

## 2023-08-18 DIAGNOSIS — K50813 Crohn's disease of both small and large intestine with fistula: Secondary | ICD-10-CM | POA: Diagnosis not present

## 2023-08-18 DIAGNOSIS — Z7960 Long term (current) use of unspecified immunomodulators and immunosuppressants: Secondary | ICD-10-CM | POA: Diagnosis not present

## 2023-08-18 LAB — COMPREHENSIVE METABOLIC PANEL
ALT: 24 U/L (ref 0–53)
AST: 26 U/L (ref 0–37)
Albumin: 3.8 g/dL (ref 3.5–5.2)
Alkaline Phosphatase: 59 U/L (ref 39–117)
BUN: 16 mg/dL (ref 6–23)
CO2: 26 meq/L (ref 19–32)
Calcium: 8.8 mg/dL (ref 8.4–10.5)
Chloride: 98 meq/L (ref 96–112)
Creatinine, Ser: 1.21 mg/dL (ref 0.40–1.50)
GFR: 67.37 mL/min (ref >60.00)
Glucose, Bld: 352 mg/dL — ABNORMAL HIGH (ref 70–99)
Potassium: 4.8 meq/L (ref 3.5–5.1)
Sodium: 130 meq/L — ABNORMAL LOW (ref 135–145)
Total Bilirubin: 0.8 mg/dL (ref 0.2–1.2)
Total Protein: 7.5 g/dL (ref 6.0–8.3)

## 2023-08-18 LAB — IBC + FERRITIN
Ferritin: 57.1 ng/mL (ref 22.0–322.0)
Iron: 73 ug/dL (ref 42–165)
Saturation Ratios: 24.8 % (ref 20.0–50.0)
TIBC: 294 ug/dL (ref 250.0–450.0)
Transferrin: 210 mg/dL — ABNORMAL LOW (ref 212.0–360.0)

## 2023-08-18 LAB — CBC WITH DIFFERENTIAL/PLATELET
Basophils Absolute: 0 10*3/uL (ref 0.0–0.1)
Basophils Relative: 0.9 % (ref 0.0–3.0)
Eosinophils Absolute: 0.3 10*3/uL (ref 0.0–0.7)
Eosinophils Relative: 7.2 % — ABNORMAL HIGH (ref 0.0–5.0)
HCT: 40.9 % (ref 39.0–52.0)
Hemoglobin: 13.5 g/dL (ref 13.0–17.0)
Lymphocytes Relative: 18.4 % (ref 12.0–46.0)
Lymphs Abs: 0.9 10*3/uL (ref 0.7–4.0)
MCHC: 33.1 g/dL (ref 30.0–36.0)
MCV: 91.2 fL (ref 78.0–100.0)
Monocytes Absolute: 0.5 10*3/uL (ref 0.1–1.0)
Monocytes Relative: 9.9 % (ref 3.0–12.0)
Neutro Abs: 3 10*3/uL (ref 1.4–7.7)
Neutrophils Relative %: 63.6 % (ref 43.0–77.0)
Platelets: 170 10*3/uL (ref 150.0–400.0)
RBC: 4.49 Mil/uL (ref 4.22–5.81)
RDW: 14.7 % (ref 11.5–15.5)
WBC: 4.7 10*3/uL (ref 4.0–10.5)

## 2023-08-18 LAB — IRON: Iron: 73 ug/dL (ref 42–165)

## 2023-08-18 LAB — B12 AND FOLATE PANEL
Folate: 9.3 ng/mL (ref >5.9)
Vitamin B-12: 501 pg/mL (ref 211–911)

## 2023-08-18 LAB — HIGH SENSITIVITY CRP: CRP, High Sensitivity: 2.11 mg/L (ref 0.000–5.000)

## 2023-08-18 LAB — GAMMA GT: GGT: 25 U/L (ref 7–51)

## 2023-08-18 LAB — TSH: TSH: 1.2 u[IU]/mL (ref 0.35–5.50)

## 2023-08-18 LAB — VITAMIN D 25 HYDROXY (VIT D DEFICIENCY, FRACTURES): VITD: 9.17 ng/mL — ABNORMAL LOW (ref 30.00–100.00)

## 2023-08-18 LAB — SEDIMENTATION RATE: Sed Rate: 82 mm/h — ABNORMAL HIGH (ref 0–20)

## 2023-08-19 ENCOUNTER — Encounter: Payer: Self-pay | Admitting: Internal Medicine

## 2023-08-19 ENCOUNTER — Ambulatory Visit: Payer: Commercial Managed Care - PPO | Admitting: Internal Medicine

## 2023-08-19 ENCOUNTER — Other Ambulatory Visit (HOSPITAL_COMMUNITY): Payer: Self-pay

## 2023-08-19 VITALS — BP 122/82 | HR 82 | Ht 69.0 in | Wt 198.6 lb

## 2023-08-19 DIAGNOSIS — E7849 Other hyperlipidemia: Secondary | ICD-10-CM | POA: Diagnosis not present

## 2023-08-19 DIAGNOSIS — Z794 Long term (current) use of insulin: Secondary | ICD-10-CM

## 2023-08-19 DIAGNOSIS — E1159 Type 2 diabetes mellitus with other circulatory complications: Secondary | ICD-10-CM

## 2023-08-19 DIAGNOSIS — E1165 Type 2 diabetes mellitus with hyperglycemia: Secondary | ICD-10-CM | POA: Diagnosis not present

## 2023-08-19 LAB — POCT GLYCOSYLATED HEMOGLOBIN (HGB A1C): Hemoglobin A1C: 12 % — AB (ref 4.0–5.6)

## 2023-08-19 MED ORDER — FREESTYLE LIBRE 3 PLUS SENSOR MISC
1.0000 | 3 refills | Status: DC
  Filled 2023-08-19: qty 6, 84d supply, fill #0
  Filled 2023-10-23: qty 2, 30d supply, fill #0
  Filled 2024-01-09: qty 2, 30d supply, fill #1
  Filled 2024-02-16 (×2): qty 2, 30d supply, fill #2

## 2023-08-19 MED ORDER — ACCU-CHEK GUIDE W/DEVICE KIT
PACK | 0 refills | Status: AC
Start: 2023-08-19 — End: ?
  Filled 2023-08-19: qty 1, 30d supply, fill #0

## 2023-08-19 MED ORDER — ACCU-CHEK SOFTCLIX LANCETS MISC
12 refills | Status: AC
  Filled 2023-08-19: qty 100, 90d supply, fill #0

## 2023-08-19 MED ORDER — ACCU-CHEK GUIDE TEST VI STRP
ORAL_STRIP | 12 refills | Status: AC
Start: 2023-08-19 — End: ?
  Filled 2023-08-19: qty 100, 90d supply, fill #0

## 2023-08-19 MED ORDER — TRESIBA FLEXTOUCH 100 UNIT/ML ~~LOC~~ SOPN: 20.0000 [IU] | PEN_INJECTOR | Freq: Every day | SUBCUTANEOUS | 3 refills | Status: DC

## 2023-08-19 MED ORDER — LYUMJEV KWIKPEN 100 UNIT/ML ~~LOC~~ SOPN: 6.0000 [IU] | PEN_INJECTOR | Freq: Three times a day (TID) | SUBCUTANEOUS | 3 refills | Status: DC

## 2023-08-19 NOTE — Progress Notes (Unsigned)
Patient ID: Sean Schaefer, male   DOB: [birth year], 58 y.o.   MRN: [id number]  HPI: Sean Schaefer is a 58 y.o.-year-old male, referred by Dr. Marin Olp (colorectal surgery), for management of DM2, dx in 2015-16, insulin-dependent few years later, uncontrolled, with complications (CAD, h/o AMI; mild CKD). He saw Dr. Lucianne Muss.  Reviewed HbA1c: Lab Results  Component Value Date   HGBA1C 11.5 (H) 06/26/2023   HGBA1C 8.9 (H) 03/12/2023   HGBA1C 12.1 (H) 10/21/2022   HGBA1C 12.1 (H) 10/20/2022   HGBA1C 8.6 (H) 01/30/2021   HGBA1C 10.7 (H) 12/01/2020   HGBA1C 9.4 Repeated and verified X2. (H) 12/10/2019   HGBA1C 7.6 (H) 09/07/2019   HGBA1C 6.4 06/01/2019   HGBA1C 6.0 03/01/2019   Pt was on a regimen of: - Tresiba 38 units at bedtime - Humalog 10 units 3x a day, before meals But now off all these. He was started on Metformin >> could not take it 2/2 diarrhea.  Pt was checking his sugars more than 4 times a day with his libre CGM. Now off, not checking sugars at all. - am: n/c - 2h after b'fast: n/c - before lunch: n/c - 2h after lunch: n/c - before dinner: n/c - 2h after dinner: n/c - bedtime: n/c - nighttime: n/c Lowest sugar was ? (Dizziness, resolved with glucose tablets). Highest sugar was upper 200s.  Glucometer: none  Pt's meals are: - Breakfast:skips - Lunch: meat occasionally + veggies - Dinner: (before 6 pm): chicken + broccoli + brown rice (1/3 cup) - Snacks:- Drinks Water. Occasional sweet tea.  - + mild CKD, last BUN/creatinine:  Lab Results  Component Value Date   BUN 16 08/18/2023   BUN 14 07/03/2023   CREATININE 1.21 08/18/2023   CREATININE 1.34 (H) 07/03/2023   Lab Results  Component Value Date   MICRALBCREAT 1.5 03/05/2019   MICRALBCREAT 15.9 12/17/2017   MICRALBCREAT <5.1 10/22/2016  He is not on an ACE inhibitor or ARB.  -+ HL; last set of lipids: Lab Results  Component Value Date   CHOL 130 02/03/2023   HDL 46 02/03/2023   LDLCALC 61  02/03/2023   LDLDIRECT 76.0 06/01/2019   TRIG 130 02/03/2023   CHOLHDL 2.8 02/03/2023  He is on Crestor 40 mg daily.  - last eye exam was in 2023. No DR reportedly.  - no numbness and tingling in his feet. + L hand tingling.  Pt has FH of DM in PGF.  He has a history of HTN, aortic dilation, Crohn's disease/Ulcerative colitis - prev. On Humira, now on Skyrizi, with perianal abscesses. He had the whole large colon removed at 58 y/o [facility]). He was in the hospital for 9 mo. 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 He estimates he had a total of >25 surgeries, including one for testicle removal.  1.5 years ago: 260 lbs >> changed diet >> lost weight to 190s.  ROS: Constitutional: + weight loss, no fatigue, no subjective hyperthermia, no subjective hypothermia, no urination or nocturia Eyes: no blurry vision Cardiovascular: no CP, no SOB, no palpitations, no leg swelling Respiratory: no cough, no SOB, no wheezing Gastrointestinal: no N, no V, + D,  no acid reflux Musculoskeletal: no muscle, no joint aches Skin: + rash, no hair loss Neurological: + tingling L hand /no dizziness  Past Medical History:  Diagnosis Date   CAD (coronary artery disease)    Crohn's disease (HCC)    Diabetes mellitus (HCC)    Dilation  of aorta (HCC)    Hypertension    Myocardial infarction (HCC)    Ulcerative colitis (HCC)    Past Surgical History:  Procedure Laterality Date   ANAL FISTULOTOMY N/A 07/03/2023   Procedure: SURGICAL TREATMENT OF TRANSSPHINCTERIC ANAL FISTULAS;  Surgeon: Andria Meuse, MD;  Location: [facility];  Service: General;  Laterality: N/A;   BIOPSY  12/12/2022   Procedure: BIOPSY;  Surgeon: Benancio Deeds, MD;  Location: [facility];  Service: Gastroenterology;;   COLON SURGERY     COLONOSCOPY     CORONARY BALLOON ANGIOPLASTY N/A 10/20/2022   Procedure: CORONARY BALLOON ANGIOPLASTY;  Surgeon: Marykay Lex, MD;  Location: [facility]VASIVE CV LAB;  Service:  Cardiovascular;  Laterality: N/A;   CORONARY STENT INTERVENTION N/A 10/20/2022   Procedure: CORONARY STENT INTERVENTION;  Surgeon: Marykay Lex, MD;  Location: [facility] INVASIVE CV LAB;  Service: Cardiovascular;  Laterality: N/A;   ESOPHAGOGASTRODUODENOSCOPY (EGD) WITH PROPOFOL N/A 12/12/2022   Procedure: ESOPHAGOGASTRODUODENOSCOPY (EGD) WITH PROPOFOL;  Surgeon: Benancio Deeds, MD;  Location: [facility] ENDOSCOPY;  Service: Gastroenterology;  Laterality: N/A;   EYE SURGERY Left    FLEXIBLE SIGMOIDOSCOPY N/A 02/05/2021   Procedure: FLEXIBLE POUCHOSCOPY WITH BIOPSY;  Surgeon: Andria Meuse, MD;  Location: [facility];  Service: General;  Laterality: N/A;   INCISION AND DRAINAGE ABSCESS N/A 02/05/2021   Procedure: INCISION AND DRAINAGE OF PERIANAL ABSCESS;  Surgeon: Andria Meuse, MD;  Location: [facility];  Service: General;  Laterality: N/A;   INCISION AND DRAINAGE ABSCESS N/A 12/21/2022   Procedure: INCISION AND DRAINAGE ABSCESS;  Surgeon: Berna Bue, MD;  Location: [facility] OR;  Service: General;  Laterality: N/A;   INCISION AND DRAINAGE ABSCESS Left 03/13/2023   Procedure: INCISION AND DRAINAGE ABSCESS;  Surgeon: Manus Rudd, MD;  Location: [facility];  Service: General;  Laterality: Left;   INCISION AND DRAINAGE PERIRECTAL ABSCESS N/A 11/30/2020   Procedure: IRRIGATION AND DEBRIDEMENT PERIRECTAL ABSCESS;  Surgeon: Fritzi Mandes, MD;  Location: [facility] OR;  Service: General;  Laterality: N/A;   INCISION AND DRAINAGE PERIRECTAL ABSCESS Left 04/03/2021   Procedure: left peri-rectal/gluteal abscess;  Surgeon: Diamantina Monks, MD;  Location: [facility] OR;  Service: General;  Laterality: Left;   LEFT HEART CATH AND CORONARY ANGIOGRAPHY N/A 10/20/2022   Procedure: LEFT HEART CATH AND CORONARY ANGIOGRAPHY;  Surgeon: Marykay Lex, MD;  Location: [facility] INVASIVE CV LAB;  Service: Cardiovascular;  Laterality: N/A;   Perianal fistula repair  07/22/2010   PLACEMENT OF SETON N/A 02/05/2021   Procedure: PLACEMENT OF  SETONS X2;  Surgeon: Andria Meuse, MD;  Location: [facility];  Service: General;  Laterality: N/A;   PLACEMENT OF SETON N/A 07/03/2023   Procedure: PLACEMENT OF DRAINING SETONS;  Surgeon: Andria Meuse, MD;  Location: [facility];  Service: General;  Laterality: N/A;   RECTAL EXAM UNDER ANESTHESIA N/A 02/05/2021   Procedure: ANORECTAL EXAM UNDER ANESTHESIA;  Surgeon: Andria Meuse, MD;  Location: [facility];  Service: General;  Laterality: N/A;   RECTAL EXAM UNDER ANESTHESIA N/A 07/03/2023   Procedure: ANORECTAL EXAM UNDER ANESTHESIA;  Surgeon: Andria Meuse, MD;  Location: [facility];  Service: General;  Laterality: N/A;   TESTICLE REMOVAL Left 2000   Social History   Socioeconomic History   Marital status: Married    Spouse name: [place] Sink   Number of children: 2   Years of education: Not on file   Highest education level: Not on file  Occupational History   Not  on file  Tobacco Use   Smoking status: Never   Smokeless tobacco: Never  Vaping Use   Vaping status: Never Used  Substance and Sexual Activity   Alcohol use: No   Drug use: No   Sexual activity: Yes  Other Topics Concern   Not on file  Social History Narrative   Married   Social Drivers of Health   Financial Resource Strain: Not on file  Food Insecurity: No Food Insecurity (03/13/2023)   Hunger Vital Sign    Worried About Running Out of Food in the Last Year: Never true    Ran Out of Food in the Last Year: Never true  Transportation Needs: No Transportation Needs (03/13/2023)   PRAPARE - Administrator, Civil Service (Medical): No    Lack of Transportation (Non-Medical): No  Physical Activity: Not on file  Stress: Not on file  Social Connections: Unknown (10/19/2022)   [facility]   Social Network    Social Network: Not on file  Intimate Partner Violence: Not At Risk (03/13/2023)   Humiliation, Afraid, Rape, and Kick questionnaire    Fear of Current or  Ex-Partner: No    Emotionally Abused: No    Physically Abused: No    Sexually Abused: No   Current Outpatient Medications on File Prior to Visit  Medication Sig Dispense Refill   amoxicillin-clavulanate (AUGMENTIN) 875-125 MG tablet Take 1 tablet by mouth every 12 (twelve) hours for 7 days. 14 tablet 0   aspirin EC 81 MG tablet Take 1 tablet (81 mg total) by mouth daily. Swallow whole. (Patient taking differently: Take 81 mg by mouth in the morning. Swallow whole.) 30 tablet 1   carvedilol (COREG) 12.5 MG tablet Take 1 tablet (12.5 mg total) by mouth 2 (two) times daily with a meal. 180 tablet 1   methocarbamol (ROBAXIN) 500 MG tablet Take 1 tablet (500 mg total) by mouth every 6 (six) hours as needed. 40 tablet 0   nitroGLYCERIN (NITROSTAT) 0.4 MG SL tablet Place 1 tablet (0.4 mg total) under the tongue every 5 (five) minutes as needed for chest pain. (Patient not taking: Reported on 08/05/2023) 45 tablet 3   ondansetron (ZOFRAN-ODT) 4 MG disintegrating tablet Dissolve 1 tablet (4 mg total) by mouth every 8 (eight) hours as needed for nausea or vomiting. (Patient not taking: Reported on 06/24/2023) 20 tablet 0   oxyCODONE (OXY IR/ROXICODONE) 5 MG immediate release tablet Take 1 tablet (5 mg total) by mouth every 6 (six) hours as needed for severe pain (pain score 7-10). (Patient not taking: Reported on 06/24/2023) 20 tablet 0   pantoprazole (PROTONIX) 40 MG tablet Take 1 tablet (40 mg total) by mouth 2 (two) times daily. (Patient not taking: Reported on 03/12/2023) 60 tablet 11   Risankizumab-rzaa (SKYRIZI) 360 MG/2.4ML SOCT Inject 2.4 mLs into the skin every 8 (eight) weeks. 2.4 mL 4   rosuvastatin (CRESTOR) 40 MG tablet Take 1 tablet (40 mg total) by mouth daily. 90 tablet 3   ticagrelor (BRILINTA) 90 MG TABS tablet Take 1 tablet (90 mg) by mouth 2 times daily. 180 tablet 1   Current Facility-Administered Medications on File Prior to Visit  Medication Dose Route Frequency Provider Last Rate Last  Admin   Insulin Pen Needle (NOVOFINE) 10 each  1 packet Subcutaneous QHS Deliah Boston L, PA-C       Allergies  Allergen Reactions   Statins Other (See Comments)    Myalgias, tolerates rosuvastatin  Family History  Problem Relation Age of Onset   Hypertension Mother    Cancer Mother    Hypertension Father    Inflammatory bowel disease Brother    Diabetes Paternal Uncle    Diabetes Paternal Grandmother    Diabetes Paternal Grandfather    Heart disease Neg Hx    Colon cancer Neg Hx    Esophageal cancer Neg Hx    Rectal cancer Neg Hx    Stomach cancer Neg Hx    PE: BP 122/82 (BP Location: Left Arm, Patient Position: Sitting, Cuff Size: Small)   Pulse 82   Ht 5\' 9"  (1.753 m)   Wt 198 lb 9.6 oz (90.1 kg)   SpO2 97%   BMI 29.33 kg/m  Wt Readings from Last 3 Encounters:  08/19/23 198 lb 9.6 oz (90.1 kg)  08/05/23 191 lb 2 oz (86.7 kg)  06/26/23 191 lb (86.6 kg)   Constitutional: normal weight, in NAD Eyes:  EOMI, no exophthalmos ENT: no neck masses, no cervical lymphadenopathy Cardiovascular: RRR, No MRG Respiratory: CTA B Musculoskeletal: no deformities Skin:+ Generalized leathery rash from [facility] Neurological: no tremor with outstretched hands  ASSESSMENT: 1. DM2, insulin-dependent, uncontrolled, with complications: - CAD, with h/o AMI - s/p PCI 09/2022 - mild CKD  2. HL  PLAN:  1. Patient with long-standing, uncontrolled diabetes, previously on basal-bolus insulin regimen, with poor control, but now off insulin.  Latest HbA1c in 1.5 months much higher, at 11.5%, increased from 8.9%.  He does mention that he changed his diet at the beginning of last year and his HbA1c decreased.  At that time, he was on basal/bolus insulin, but he came off recently.  He is also not checking his blood sugars.  He had a CGM in the past but this came off few mo ago >> did not restart. -His diabetes control and overall health status is significantly impacted by his UC and h/o large  colon resection.  His most recent history was complicated by pelvic abscess and fistula formation and he is on biologic agent, which he does not tolerate very well.  He also needs to avoid diabetic medications with GI side effects.  He could not tolerate metformin in the past. -We discussed that in the setting of very high blood sugars, his body has a hard time healing, predisposing him to complications from his UC.  He also has a history of MI. We absolutely need him to improve his blood sugars.  He is willing to start to take better control of his diabetes.  He started to improve diet and is ready to restart insulin and the CGM.  We sent a prescription to the pharmacy for the CGM and also for a glucometer and supplies.  We discussed about starting insulin at lower doses and increase as needed.  He tells me that he still has [place] and Humalog at home, in the fridge and thinks that they are not expired.  At today's visit, we discussed that after he runs out of Humalog, we can start Lyumjev, which is injected at the start of the meal, as opposed to Humalog which needs to be injected 15 minutes in advance.  He was not aware of this.  He will start doing so.  I printed him a prescription for [place] and Lyumjev.  However, I recommended to start at lower doses for both insulins and increase as needed, based on the blood sugars. I did not give him a sliding scale for close for now,  because I want him to focus on mealtime insulin rather than correction insulin. - at next visit, after his blood sugars improved, I am hoping that we could check him for insulin deficiency - I suggested to:  Patient Instructions  Please start back on the [place] sensor.  Also, start: - Tresiba 20 units daily - Humalog 6-8 units before meals (15 min before the meal)/Lyumjev 6-8 units right before the meal.  Adjust the doses up as needed.  Please return in 1.5 months.  - advised him to check sugars at different times of the day -  check 4x a day with the CGM - rec'd CBG targets for treatment: 80-130 mg/dL before meals and <161 mg/dL after meals; target WRU0A <7%. - given foot care handout and explained the principles  - given instructions for hypoglycemia management "15-15 rule"  - advised for yearly eye exams  - will check an ACR today - Return to clinic in 1.5 months  2. HL - Reviewed latest lipid panel: LDL slightly above our target of less than 55 due to cardiovascular disease, otherwise fractions at goal: Lab Results  Component Value Date   CHOL 130 02/03/2023   HDL 46 02/03/2023   LDLCALC 61 02/03/2023   LDLDIRECT 76.0 06/01/2019   TRIG 130 02/03/2023   CHOLHDL 2.8 02/03/2023  - Continues Crestor 40 mg daily without side effects.  Sean Pavlov, MD PhD [facility]

## 2023-08-19 NOTE — Patient Instructions (Addendum)
Please start back on the [place] sensor.  Also, start: - Tresiba 20 units daily - Humalog 6-8 units before meals (15 min before the meal)/Lyumjev 6-8 units right before the meal.  Adjust the doses up as needed.  Please return in 1.5 months.  PATIENT INSTRUCTIONS FOR TYPE 2 DIABETES:  **Please join MyChart!** - see attached instructions about how to join if you have not done so already.  DIET AND EXERCISE Diet and exercise is an important part of diabetic treatment.  We recommended aerobic exercise in the form of brisk walking (working between 40-60% of maximal aerobic capacity, similar to brisk walking) for 150 minutes per week (such as 30 minutes five days per week) along with 3 times per week performing 'resistance' training (using various gauge rubber tubes with handles) 5-10 exercises involving the major muscle groups (upper body, lower body and core) performing 10-15 repetitions (or near fatigue) each exercise. Start at half the above goal but build slowly to reach the above goals. If limited by weight, joint pain, or disability, we recommend daily walking in a swimming pool with water up to waist to reduce pressure from joints while allow for adequate exercise.    BLOOD GLUCOSES Monitoring your blood glucoses is important for continued management of your diabetes. Please check your blood glucoses 2-4 times a day: fasting, before meals and at bedtime (you can rotate these measurements - e.g. one day check before the 3 meals, the next day check before 2 of the meals and before bedtime, etc.).   HYPOGLYCEMIA (low blood sugar) Hypoglycemia is usually a reaction to not eating, exercising, or taking too much insulin/ other diabetes drugs.  Symptoms include tremors, sweating, hunger, confusion, headache, etc. Treat IMMEDIATELY with 15 grams of Carbs: 4 glucose tablets  cup regular juice/soda 2 tablespoons raisins 4 teaspoons sugar 1 tablespoon honey Recheck blood glucose in 15 mins and  repeat above if still symptomatic/blood glucose <100.  RECOMMENDATIONS TO REDUCE YOUR RISK OF DIABETIC COMPLICATIONS: * Take your prescribed MEDICATION(S) * Follow a DIABETIC diet: Complex carbs, fiber rich foods, (monounsaturated and polyunsaturated) fats * AVOID saturated/trans fats, high fat foods, >2,300 mg salt per day. * EXERCISE at least 5 times a week for 30 minutes or preferably daily.  * DO NOT SMOKE OR DRINK more than 1 drink a day. * Check your FEET every day. Do not wear tightfitting shoes. Contact us if you develop an ulcer * See your EYE doctor once a year or more if needed * Get a FLU shot once a year * Get a PNEUMONIA vaccine once before and once after age 35 years  GOALS:  * Your Hemoglobin A1c of <7%  * fasting sugars need to be 80-130 * after meals sugars need to be <180 (2h after you start eating) * Your Systolic BP should be 130 or lower  * Your Diastolic BP should be 80 or lower  * Your HDL (Good Cholesterol) should be 40 or higher  * Your LDL (Bad Cholesterol) should be ideally <70. * Your Triglycerides should be 150 or lower  * Your Urine microalbumin (kidney function) should be <30 * Your Body Mass Index should be 25 or lower   Please consider the following ways to cut down carbs and fat and increase fiber and micronutrients in your diet: - substitute whole grain for white bread or pasta - substitute brown rice for white rice - substitute 90-calorie flat bread pieces for slices of bread when possible - substitute sweet potatoes  or yams for white potatoes - substitute humus for margarine - substitute tofu for cheese when possible - substitute almond or rice milk for regular milk (would not drink soy milk daily due to concern for soy estrogen influence on breast cancer risk) - substitute dark chocolate for other sweets when possible - substitute water - can add lemon or orange slices for taste - for diet sodas (artificial sweeteners will trick your body that  you can eat sweets without getting calories and will lead you to overeating and weight gain in the long run) - do not skip breakfast or other meals (this will slow down the metabolism and will result in more weight gain over time)  - can try smoothies made from fruit and almond/rice milk in am instead of regular breakfast - can also try old-fashioned (not instant) oatmeal made with almond/rice milk in am - order the dressing on the side when eating salad at a restaurant (pour less than half of the dressing on the salad) - eat as little meat as possible - can try juicing, but should not forget that juicing will get rid of the fiber, so would alternate with eating raw veg./fruits or drinking smoothies - use as little oil as possible, even when using olive oil - can dress a salad with a mix of balsamic vinegar and lemon juice, for e.g. - use agave nectar, stevia sugar, or regular sugar rather than artificial sweateners - steam or broil/roast veggies  - snack on veggies/fruit/nuts (unsalted, preferably) when possible, rather than processed foods - reduce or eliminate aspartame in diet (it is in diet sodas, chewing gum, etc) Read the labels!  Try to read Dr. Katherina Right book: "Program for Reversing Diabetes" for other ideas for healthy eating.

## 2023-08-19 NOTE — Progress Notes (Unsigned)
Error

## 2023-08-20 ENCOUNTER — Encounter: Payer: Self-pay | Admitting: Internal Medicine

## 2023-08-20 LAB — MICROALBUMIN / CREATININE URINE RATIO
Creatinine, Urine: 57 mg/dL (ref 20–320)
Microalb Creat Ratio: 156 mg/g{creat} — ABNORMAL HIGH (ref <30)
Microalb, Ur: 8.9 mg/dL

## 2023-08-21 DIAGNOSIS — E7800 Pure hypercholesterolemia, unspecified: Secondary | ICD-10-CM | POA: Diagnosis not present

## 2023-08-21 DIAGNOSIS — E1169 Type 2 diabetes mellitus with other specified complication: Secondary | ICD-10-CM | POA: Diagnosis not present

## 2023-08-21 DIAGNOSIS — I10 Essential (primary) hypertension: Secondary | ICD-10-CM | POA: Diagnosis not present

## 2023-08-21 DIAGNOSIS — K5090 Crohn's disease, unspecified, without complications: Secondary | ICD-10-CM | POA: Diagnosis not present

## 2023-08-21 DIAGNOSIS — Z794 Long term (current) use of insulin: Secondary | ICD-10-CM | POA: Diagnosis not present

## 2023-08-22 LAB — ZINC: Zinc: 55 ug/dL — ABNORMAL LOW (ref 60–130)

## 2023-08-25 ENCOUNTER — Other Ambulatory Visit: Payer: Self-pay

## 2023-08-25 ENCOUNTER — Other Ambulatory Visit (HOSPITAL_COMMUNITY): Payer: Self-pay

## 2023-08-25 MED ORDER — ZINC 30 MG PO TABS: 1.0000 | ORAL_TABLET | Freq: Every day | ORAL | Status: DC

## 2023-08-25 MED ORDER — VITAMIN D3 20 MCG (800 UNIT) PO TABS
1.0000 | ORAL_TABLET | Freq: Every day | ORAL | 5 refills | Status: DC
  Filled 2023-08-25: qty 30, fill #0

## 2023-08-25 MED ORDER — VITAMIN D3 1.25 MG (50000 UT) PO CAPS
ORAL_CAPSULE | ORAL | 0 refills | Status: DC
  Filled 2023-08-25: qty 12, 84d supply, fill #0

## 2023-09-03 ENCOUNTER — Other Ambulatory Visit (HOSPITAL_COMMUNITY): Payer: Self-pay

## 2023-09-04 ENCOUNTER — Other Ambulatory Visit: Payer: Self-pay

## 2023-09-04 ENCOUNTER — Other Ambulatory Visit (HOSPITAL_COMMUNITY): Payer: Self-pay

## 2023-09-04 MED ORDER — INSULIN DEGLUDEC 100 UNIT/ML ~~LOC~~ SOPN: 20.0000 [IU] | PEN_INJECTOR | Freq: Every day | SUBCUTANEOUS | 3 refills | Status: DC

## 2023-09-04 MED ORDER — LYUMJEV KWIKPEN 100 UNIT/ML ~~LOC~~ SOPN
6.0000 [IU] | PEN_INJECTOR | Freq: Three times a day (TID) | SUBCUTANEOUS | 3 refills | Status: DC
Start: 2023-08-19 — End: 2024-04-30
  Filled 2023-09-04: qty 21, 88d supply, fill #0

## 2023-09-04 NOTE — Progress Notes (Signed)
Specialty Pharmacy Refill Coordination Note  KHOURY SIEMON is a 58 y.o. male contacted today regarding refills of specialty medication(s) [name])   Patient requested [name] at [facility] at [place] date: 09/12/23   Medication will be filled on 09/11/23.

## 2023-09-05 ENCOUNTER — Other Ambulatory Visit: Payer: Self-pay

## 2023-09-09 ENCOUNTER — Other Ambulatory Visit: Payer: Self-pay

## 2023-09-12 ENCOUNTER — Other Ambulatory Visit (HOSPITAL_COMMUNITY): Payer: Self-pay

## 2023-10-13 ENCOUNTER — Other Ambulatory Visit (HOSPITAL_COMMUNITY): Payer: Self-pay

## 2023-10-22 NOTE — Progress Notes (Unsigned)
 [facility] Progress Note  Chief Complaint: Fistulizing Crohn's disease of the IPAA  Subjective  Prior history Sean Schaefer is a 58 y.o. 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 Steroids, 5-ASA) 4098-1191 - 3 stage TAPC + IPAA (TAC + IE, Dr. Orpah Greek in [place]; IPAA, Dr. Janean Sark at [facility]) Reports doing well throughout the 1990s with satisfactory pouch function Late 1990's/early 2000's - evolution of perianal fistulous and recurrent perianal abscesses - no IBD tx 2012 - EUA for perirectal abscesses; at least 3 fistula tracts curetted and fulgurated 2015 - Saw Dr. Loreta Ave and tx'd w/ Humira x 8 months without benefit (dosing/levels unknown), reports feeling poorly 2022 - Established care with [facility]            EUA x 3 (11/2020, 01/2021, 03/2021) -multiple transfer enteric fistulous status post seton placement; fistulous disease distal to anastomosis            Initiated IFX 5 mg/kg  Q4 weeks--> attempted titration to 10 mg/kg which resulted in visual changes and decreased back to 5 mg/kg 2023 - Combination therapyw/ IFX 5 mg/kg + AZA; TDM IFX 1.7, Ab 0 2024 - Induction Skyrizi 01/2023            Developed complex abscess involving left hip during hospitalization 02/2023            EUA x 3 (12/2022, 02/2023, 06/2023) - I&Ds with seton placement x 2  Saw my colleague Dr. Maren Beach in consultation 08/05/2023 after his most recent incision and drainage and seton placement with subsequent antibiotics. Dr. Doy Hutching recommended increasing the Skyrizi maintenance dose from 180 mg to 360 mg, and  some additional lab work was performed. She agreed with Dr. Cliffton Asters that a diverting ileostomy may be beneficial for him, though Sean Schaefer has been reluctant to do so in multiple conversations with his surgeon. She also suggested consideration of chronic suppressive antibiotics such as ciprofloxacin or Flagyl.  Saw Dr. Angelena Form on 08/13/2023 with a plan for follow-up in 3 months.   Discussed the use of AI scribe software for clinical note transcription with the patient, who gave verbal consent to proceed.  History of Present Illness Sean Schaefer is a 58 year old male with chronic inflammatory bowel disease who presents with persistent pain and concerns about fistula management.  He experiences persistent pain in the perianal region, describing it as 'chronic' and 'misery'. This pain is not currently associated with an abscess, unlike previous episodes where abscess formation preceded medical visits. No current infection, fever, or sepsis is reported, which are symptoms he typically experiences during infections.  He underwent surgery in December for the placement of new setons, which have been bothersome. He has a history of multiple surgeries and is scheduled to see Angelena Form on the 23rd of this month for further evaluation. He is reluctant to undergo further surgical interventions, particularly the creation of an ileostomy, due to concerns about permanence and quality of life impacts.  He talks as having become accustomed to this complex problem over 30 years of this condition, and he currently uses a diaper due  to occasional incontinence. The fistulas have not resolved with current medical treatments, including increased dosages of medication, and he continues to experience significant discomfort.  He is understandably disheartened by that.  His past medical history includes a heart attack, complicating the use of certain medications like JAK inhibitors due to the risk of blood clots. He has been  on [employer], but it has not provided the desired control over his symptoms. Periodic antibiotic courses are used to manage infections, though these are not curative for the fistulas.   ROS: Cardiovascular:  no chest pain Respiratory: no dyspnea No fever Remainder systems negative except as above  The patient's Past Medical, Family and Social History were reviewed and are on file in the EMR. Past Medical History:  Diagnosis Date   CAD (coronary artery disease)    Crohn's disease (HCC)    Diabetes mellitus (HCC)    Dilation of aorta (HCC)    Hypertension    Myocardial infarction (HCC)    Ulcerative colitis (HCC)     Past Surgical History:  Procedure Laterality Date   ANAL FISTULOTOMY N/A 07/03/2023   Procedure: SURGICAL TREATMENT OF TRANSSPHINCTERIC ANAL FISTULAS;  Surgeon: Andria Meuse, MD;  Location: [facility];  Service: General;  Laterality: N/A;   BIOPSY  12/12/2022   Procedure: BIOPSY;  Surgeon: Benancio Deeds, MD;  Location: [facility];  Service: Gastroenterology;;   COLON SURGERY     COLONOSCOPY     CORONARY BALLOON ANGIOPLASTY N/A 10/20/2022   Procedure: CORONARY BALLOON ANGIOPLASTY;  Surgeon: Marykay Lex, MD;  Location: [facility] INVASIVE CV LAB;  Service: Cardiovascular;  Laterality: N/A;   CORONARY STENT INTERVENTION N/A 10/20/2022   Procedure: CORONARY STENT INTERVENTION;  Surgeon: Marykay Lex, MD;  Location: [facility] INVASIVE CV LAB;  Service: Cardiovascular;  Laterality: N/A;   ESOPHAGOGASTRODUODENOSCOPY (EGD) WITH PROPOFOL N/A 12/12/2022   Procedure: ESOPHAGOGASTRODUODENOSCOPY (EGD) WITH PROPOFOL;  Surgeon: Benancio Deeds, MD;  Location: [facility] ENDOSCOPY;  Service: Gastroenterology;  Laterality: N/A;   EYE SURGERY Left    FLEXIBLE SIGMOIDOSCOPY N/A 02/05/2021   Procedure: FLEXIBLE POUCHOSCOPY WITH BIOPSY;  Surgeon: Andria Meuse, MD;  Location: [facility];  Service: General;  Laterality: N/A;   INCISION AND DRAINAGE ABSCESS N/A 02/05/2021   Procedure:  INCISION AND DRAINAGE OF PERIANAL ABSCESS;  Surgeon: Andria Meuse, MD;  Location: [facility];  Service: General;  Laterality: N/A;   INCISION AND DRAINAGE ABSCESS N/A 12/21/2022   Procedure: INCISION AND DRAINAGE ABSCESS;  Surgeon: Berna Bue, MD;  Location: [facility] OR;  Service: General;  Laterality: N/A;   INCISION AND DRAINAGE ABSCESS Left 03/13/2023   Procedure: INCISION AND DRAINAGE ABSCESS;  Surgeon: Manus Rudd, MD;  Location: [facility];  Service: General;  Laterality: Left;   INCISION AND DRAINAGE PERIRECTAL ABSCESS N/A 11/30/2020   Procedure: IRRIGATION AND DEBRIDEMENT PERIRECTAL ABSCESS;  Surgeon: Fritzi Mandes, MD;  Location: [facility] OR;  Service: General;  Laterality: N/A;   INCISION AND DRAINAGE PERIRECTAL ABSCESS Left 04/03/2021   Procedure: left peri-rectal/gluteal abscess;  Surgeon: Diamantina Monks, MD;  Location: [facility] OR;  Service: General;  Laterality: Left;   LEFT HEART CATH AND CORONARY ANGIOGRAPHY N/A 10/20/2022   Procedure: LEFT HEART CATH AND CORONARY ANGIOGRAPHY;  Surgeon: Marykay Lex, MD;  Location: [facility] INVASIVE CV LAB;  Service: Cardiovascular;  Laterality: N/A;   Perianal fistula repair  07/22/2010   PLACEMENT OF SETON N/A 02/05/2021   Procedure: PLACEMENT OF SETONS X2;  Surgeon: Andria Meuse, MD;  Location: [facility];  Service: General;  Laterality: N/A;   PLACEMENT OF SETON N/A 07/03/2023   Procedure: PLACEMENT OF DRAINING SETONS;  Surgeon: Andria Meuse, MD;  Location: [facility];  Service: General;  Laterality: N/A;   RECTAL EXAM UNDER ANESTHESIA N/A 02/05/2021   Procedure: ANORECTAL EXAM UNDER ANESTHESIA;  Surgeon: Andria Meuse, MD;  Location: [facility];  Service: General;  Laterality: N/A;   RECTAL EXAM UNDER ANESTHESIA N/A 07/03/2023   Procedure: ANORECTAL EXAM UNDER ANESTHESIA;  Surgeon: Andria Meuse, MD;  Location: [facility];  Service: General;  Laterality: N/A;   TESTICLE REMOVAL Left 2000     Objective:  Med list reviewed  Current  Outpatient Medications:    Accu-Chek Softclix Lancets lancets, Use to check glucose once a day, Disp: 100 each, Rfl: 12   aspirin EC 81 MG tablet, Take 1 tablet (81 mg total) by mouth daily. Swallow whole. (Patient taking differently: Take 81 mg by mouth in the morning. Swallow whole.), Disp: 30 tablet, Rfl: 1   Blood Glucose Monitoring Suppl (ACCU-CHEK GUIDE) w/Device KIT, Use to check glucose once a day, Disp: 1 kit, Rfl: 0   carvedilol (COREG) 12.5 MG tablet, Take 1 tablet (12.5 mg total) by mouth 2 (two) times daily with a meal., Disp: 180 tablet, Rfl: 1   Cholecalciferol (VITAMIN D3) 1.25 MG (50000 UT) CAPS, Take 1 capsule by mouth once a week., Disp: 12 capsule, Rfl: 0   Continuous Glucose Sensor (FREESTYLE LIBRE 3 PLUS SENSOR) MISC, Apply to skin every 14 (fourteen) days as directed., Disp: 6 each, Rfl: 3   glucose blood (ACCU-CHEK GUIDE TEST) test strip, Use to check glucose once a day, Disp: 100 each, Rfl: 12   insulin degludec (TRESIBA FLEXTOUCH) 100 UNIT/ML FlexTouch Pen, Inject 20 Units into the skin daily., Disp: 30 mL, Rfl: 3   insulin degludec (TRESIBA) 100 UNIT/ML FlexTouch Pen, Inject 20 Units into the skin daily., Disp: 30 mL, Rfl: 3   Insulin Lispro-aabc (LYUMJEV KWIKPEN) 100 UNIT/ML KwikPen, Inject 6-8 Units into the skin 3 (three) times daily before meals., Disp: 30 mL, Rfl: 3   Insulin Lispro-aabc (LYUMJEV KWIKPEN) 100 UNIT/ML KwikPen, Inject 6-8 Units into the skin 3 (three) times daily before meals., Disp: 30 mL, Rfl: 3   Risankizumab-rzaa (SKYRIZI) 360 MG/2.4ML SOCT, Inject 2.4 mLs into the skin every 8 (eight) weeks., Disp: 2.4 mL, Rfl: 4   rosuvastatin (CRESTOR) 40 MG tablet, Take 1 tablet (40 mg total) by mouth daily., Disp: 90 tablet, Rfl: 3   ticagrelor (BRILINTA) 90 MG TABS tablet, Take 1 tablet (90 mg) by mouth 2 times daily., Disp: 180 tablet, Rfl: 1   traMADol (ULTRAM) 50 MG tablet, Take 1 tablet (50 mg total) by mouth every 6 (six) hours as needed., Disp: 30 tablet,  Rfl: 0   Zinc 30 MG TABS, Take 1 tablet (30 mg total) by mouth daily., Disp: , Rfl:    amoxicillin-clavulanate (AUGMENTIN) 875-125 MG tablet, Take 1 tablet by mouth every 12 (twelve) hours for 7 days. (Patient not taking: Reported on 10/23/2023), Disp: 14 tablet, Rfl: 0   Cholecalciferol (VITAMIN D3) 20 MCG (800 UNIT) TABS, Take 1 tablet by mouth daily. (Patient not taking: Reported on 10/23/2023), Disp: 30 tablet, Rfl: 5   methocarbamol (ROBAXIN) 500 MG tablet, Take 1 tablet (500 mg total) by mouth every 6 (six) hours as needed. (Patient not taking: Reported on 10/23/2023), Disp: 40 tablet, Rfl: 0   nitroGLYCERIN (NITROSTAT) 0.4 MG SL tablet, Place 1  tablet (0.4 mg total) under the tongue every 5 (five) minutes as needed for chest pain. (Patient not taking: Reported on 08/05/2023), Disp: 45 tablet, Rfl: 3   ondansetron (ZOFRAN-ODT) 4 MG disintegrating tablet, Dissolve 1 tablet (4 mg total) by mouth every 8 (eight) hours as needed for nausea or vomiting. (Patient not taking: Reported on 10/23/2023), Disp: 20 tablet, Rfl: 0   oxyCODONE (OXY IR/ROXICODONE) 5 MG immediate release tablet, Take 1 tablet (5 mg total) by mouth every 6 (six) hours as needed for severe pain (pain score 7-10). (Patient not taking: Reported on 10/23/2023), Disp: 20 tablet, Rfl: 0   pantoprazole (PROTONIX) 40 MG tablet, Take 1 tablet (40 mg total) by mouth 2 (two) times daily. (Patient not taking: Reported on 10/23/2023), Disp: 60 tablet, Rfl: 11  Current Facility-Administered Medications:    Insulin Pen Needle (NOVOFINE) 10 each, 1 packet, Subcutaneous, QHS, Ofilia Neas, PA-C   Vital signs in last 24 hrs: Vitals:   10/23/23 0924  BP: 122/80  Pulse: 90   Wt Readings from Last 3 Encounters:  10/23/23 209 lb (94.8 kg)  08/19/23 198 lb 9.6 oz (90.1 kg)  08/05/23 191 lb 2 oz (86.7 kg)    Physical Exam Wife present for entire visit Not acutely ill-appearing as I have often seen him Visibly uncomfortable is always trying to  set HEENT: sclera anicteric, oral mucosa moist without lesions Neck: supple, no thyromegaly, JVD or lymphadenopathy Cardiac: Regular with appreciable murmur,  no peripheral edema Pulm: clear to auscultation bilaterally, normal RR and effort noted Abdomen: soft, no tenderness, with active bowel sounds. No guarding or palpable hepatosplenomegaly.  Multiple scars Perianal fistulas before, primarily left-sided with 2 setons in place.  No fluctuance, erythema or drainage    Labs:     Latest Ref Rng & Units 08/18/2023    8:47 AM 06/26/2023    9:38 AM 03/14/2023    4:13 AM  CBC  WBC 4.0 - 10.5 K/uL 4.7  6.2  11.8   Hemoglobin 13.0 - 17.0 g/dL 16.1  09.6  04.5   Hematocrit 39.0 - 52.0 % 40.9  42.1  38.1   Platelets 150.0 - 400.0 K/uL 170.0  181  145       Latest Ref Rng & Units 08/18/2023    8:47 AM 07/03/2023    2:30 PM 03/14/2023    4:13 AM  CMP  Glucose 70 - 99 mg/dL 409  811  914   BUN 6 - 23 mg/dL 16  14  27    Creatinine 0.40 - 1.50 mg/dL 7.82  9.56  2.13   Sodium 135 - 145 mEq/L 130  129  131   Potassium 3.5 - 5.1 mEq/L 4.8  4.5  4.5   Chloride 96 - 112 mEq/L 98  97  99   CO2 19 - 32 mEq/L 26  23  21    Calcium 8.4 - 10.5 mg/dL 8.8  9.3  8.2   Total Protein 6.0 - 8.3 g/dL 7.5   7.8   Total Bilirubin 0.2 - 1.2 mg/dL 0.8   1.0   Alkaline Phos 39 - 117 U/L 59   58   AST 0 - 37 U/L 26   15   ALT 0 - 53 U/L 24   12    Vitamin D and zinc were low, supplements for both were prescribed.  Sed rate 82 on 08/18/2023  Hemoglobin A1c 12.0 on 08/19/2023 (up from 8.9 in August 2024) ___________________________________________ Radiologic studies:   ____________________________________________  Other:   _____________________________________________   Encounter Diagnoses  Name Primary?   Perianal pain Yes   Long-term use of immunosuppressant medication    Crohn's disease of perianal region with abscess [facility])     Assessment and Plan Assessment & Plan Chronic Perianal  Fistulas Chronic Crohn's disease related perianal fistulas persist with significant pain and recurrent infections despite previous surgical interventions and Skyrizi therapy. Ileostomy again discussed but patient reluctant. Rationale given for diverting the fecal stream with an ileostomy.  He still does not wish to do that.  Long-term antibiotics pose resistance and C. difficile risks. JAK inhibitors considered but myocardial infarction history increases thrombotic risk. - Consider periodic antibiotics for infection risk management.  I think the next best step is to try getting insurance approval for [facility] every 4 to 6 weeks.  Will check in with Dr. Doy Hutching to see if she has advice on that. Difficult to know if his insurance company will approve this since it is not standard dosing. That said, he has severe fistulizing disease that has failed anti-TNF therapy and options are limited.  He was hoping for something to relieve the chronic perianal pain.  He says he has oxycodone at home from hospitalization but he does not take it either because it does not work or does not agree with him.  He could not remember other medicines he might have taken says he has a high pain threshold. He can use some topical lidocaine (RectiCare) ointment.  I do not really feel comfortable prescribing long-term opioids.  I asked him if he had tried tramadol, and he and his wife thought perhaps he had some point but they cannot recall for sure.  I gave him a modest supply of tramadol to try, particularly at nighttime to help him get relief from the pain and some better rest.  Follow-up Scheduled to see Dr. Angelena Form for further evaluation and management. - Ensure follow-up with Dr. Angelena Form on April 23rd. I expect Dr. Cliffton Asters will have ongoing discussions with him about the possibility of an end ileostomy and would likely be permanent (unless future Crohn's related medicines are developed that might be able to heal his  fistula and then consider creating a new pouch.  He seems disinclined to have her undergo further Crohn's related surgery, saying he would rather deal with his current misery then what would happen from another ostomy.   Optimization of glucose management since his hemoglobin A1c is running high.  Plan:   40 minutes were spent on this encounter (including chart review, history/exam, counseling/coordination of care, and documentation) > 50% of that time was spent on counseling and coordination of care.   Sean Schaefer

## 2023-10-23 ENCOUNTER — Other Ambulatory Visit (HOSPITAL_COMMUNITY): Payer: Self-pay

## 2023-10-23 ENCOUNTER — Encounter: Payer: Self-pay | Admitting: Gastroenterology

## 2023-10-23 ENCOUNTER — Ambulatory Visit: Payer: Commercial Managed Care - PPO | Admitting: Gastroenterology

## 2023-10-23 VITALS — BP 122/80 | HR 90 | Ht 69.0 in | Wt 209.0 lb

## 2023-10-23 DIAGNOSIS — K6289 Other specified diseases of anus and rectum: Secondary | ICD-10-CM | POA: Diagnosis not present

## 2023-10-23 DIAGNOSIS — Z7960 Long term (current) use of unspecified immunomodulators and immunosuppressants: Secondary | ICD-10-CM | POA: Diagnosis not present

## 2023-10-23 DIAGNOSIS — K50114 Crohn's disease of large intestine with abscess: Secondary | ICD-10-CM | POA: Diagnosis not present

## 2023-10-23 MED ORDER — TRAMADOL HCL 50 MG PO TABS
50.0000 mg | ORAL_TABLET | Freq: Four times a day (QID) | ORAL | 0 refills | Status: DC | PRN
  Filled 2023-10-23: qty 30, 8d supply, fill #0

## 2023-10-23 NOTE — Addendum Note (Signed)
 Addended by: Charlie Pitter on: 10/23/2023 01:07 PM   Modules accepted: Level of Service

## 2023-10-23 NOTE — Patient Instructions (Signed)
 We have sent the following medications to your pharmacy for you to pick up at your convenience: Tramadol   _______________________________________________________  If your blood pressure at your visit was 140/90 or greater, please contact your primary care physician to follow up on this.  _______________________________________________________  If you are age 56 or older, your body mass index should be between 23-30. Your Body mass index is 30.86 kg/m. If this is out of the aforementioned range listed, please consider follow up with your Primary Care Provider.  If you are age 36 or younger, your body mass index should be between 19-25. Your Body mass index is 30.86 kg/m. If this is out of the aformentioned range listed, please consider follow up with your Primary Care Provider.   ________________________________________________________  The [facility] providers would like to encourage you to use [facility] to communicate with providers for non-urgent requests or questions.  Due to long hold times on the telephone, sending your provider a message by [facility] may be a faster and more efficient way to get a response.  Please allow 48 business hours for a response.  Please remember that this is for non-urgent requests.  _______________________________________________________  Thank you for choosing me and [facility].  Dr. Amada Jupiter

## 2023-10-24 ENCOUNTER — Ambulatory Visit: Payer: Commercial Managed Care - PPO | Admitting: Internal Medicine

## 2023-10-28 ENCOUNTER — Ambulatory Visit: Admitting: Internal Medicine

## 2023-10-28 ENCOUNTER — Encounter: Payer: Self-pay | Admitting: Internal Medicine

## 2023-10-28 VITALS — BP 122/70 | HR 95 | Ht 69.0 in | Wt 211.0 lb

## 2023-10-28 DIAGNOSIS — Z794 Long term (current) use of insulin: Secondary | ICD-10-CM

## 2023-10-28 DIAGNOSIS — E7849 Other hyperlipidemia: Secondary | ICD-10-CM | POA: Diagnosis not present

## 2023-10-28 DIAGNOSIS — E1165 Type 2 diabetes mellitus with hyperglycemia: Secondary | ICD-10-CM

## 2023-10-28 DIAGNOSIS — E1159 Type 2 diabetes mellitus with other circulatory complications: Secondary | ICD-10-CM

## 2023-10-28 LAB — POCT GLYCOSYLATED HEMOGLOBIN (HGB A1C): Hemoglobin A1C: 10.4 % — AB (ref 4.0–5.6)

## 2023-10-28 MED ORDER — INSULIN DEGLUDEC 100 UNIT/ML ~~LOC~~ SOPN: 28.0000 [IU] | PEN_INJECTOR | Freq: Every day | SUBCUTANEOUS | Status: DC

## 2023-10-28 NOTE — Progress Notes (Signed)
 Patient ID: Sean Schaefer, male   DOB: [birth year], 58 y.o.   MRN: [id number]  HPI: Sean Schaefer is a 58 y.o.-year-old male, referred by Dr. Marin Schaefer (colorectal surgery), for management of DM2, dx in 2015-16, insulin-dependent few years later, uncontrolled, with complications (CAD, h/o AMI; mild CKD). He saw Dr. Lucianne Schaefer.  Interim hx: He has another Crohn's flair. He is on Skyrizi, but feels sugars increased after starting this. He started juicing since last OV.  Reviewed HbA1c: Lab Results  Component Value Date   HGBA1C 12.0 (A) 08/19/2023   HGBA1C 11.5 (H) 06/26/2023   HGBA1C 8.9 (H) 03/12/2023   HGBA1C 12.1 (H) 10/21/2022   HGBA1C 12.1 (H) 10/20/2022   HGBA1C 8.6 (H) 01/30/2021   HGBA1C 10.7 (H) 12/01/2020   HGBA1C 9.4 Repeated and verified X2. (H) 12/10/2019   HGBA1C 7.6 (H) 09/07/2019   HGBA1C 6.4 06/01/2019   At last visit, he was off his insulins and we restarted: - Tresiba 38 >> off >> 20 units daily - Humalog 10 units 3x a day, with meals >> Lyumjev 6-8 units right before the meal. He was started on Metformin >> could not take it 2/2 diarrhea.  He was not checking sugars at last visit and still not checking consistently, but just got the [place] 3 today.  - am: 90, 160-180, 209 - 2h after b'fast: n/c - before lunch: 180-190 - 2h after lunch: n/c - before dinner: 180-190 - 2h after dinner: n/c - bedtime: 200-210 - nighttime: n/c Lowest sugar was ? (Dizziness, resolved with glucose tablets). Highest sugar was upper 200s >> 209.  Glucometer: none  Pt's meals are: - Breakfast:skips or juice - Lunch: meat occasionally + veggies - Dinner: (before 6 pm): chicken + broccoli + brown rice (1/3 cup) - Snacks:- Drinks Water, juice.  - + mild CKD, last BUN/creatinine:  Lab Results  Component Value Date   BUN 16 08/18/2023   BUN 14 07/03/2023   CREATININE 1.21 08/18/2023   CREATININE 1.34 (H) 07/03/2023   Lab Results  Component Value Date   MICRALBCREAT 156  (H) 08/19/2023   MICRALBCREAT 1.5 03/05/2019   MICRALBCREAT 15.9 12/17/2017   MICRALBCREAT <5.1 10/22/2016  He is not on an ACE inhibitor or ARB.  -+ HL; last set of lipids: Lab Results  Component Value Date   CHOL 130 02/03/2023   HDL 46 02/03/2023   LDLCALC 61 02/03/2023   LDLDIRECT 76.0 06/01/2019   TRIG 130 02/03/2023   CHOLHDL 2.8 02/03/2023  He is on Crestor 40 mg daily.  - last eye exam was in 2023. No DR reportedly.  - no numbness and tingling in his feet. + L hand tingling.  Pt has FH of DM in PGF.  He has a history of HTN, aortic dilation, Crohn's disease/Ulcerative colitis - prev. On Humira, now on Skyrizi, with perianal abscesses. He had the whole large colon removed at 58 y/o [facility]). He was in the hospital for 9 mo. 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 He estimates he had a total of >25 surgeries, including one for testicle removal.  1.5 years ago: 260 lbs >> changed diet >> lost weight to 190s.  ROS: + see HPI  Past Medical History:  Diagnosis Date   CAD (coronary artery disease)    Crohn's disease (HCC)    Diabetes mellitus (HCC)    Dilation of aorta (HCC)    Hypertension    Myocardial infarction (HCC)    Ulcerative colitis (  HCC)     Social History   Socioeconomic History   Marital status: Married    Spouse name: Sean Schaefer   Number of children: 2   Years of education: Not on file   Highest education level: Not on file  Occupational History   Not on file  Tobacco Use   Smoking status: Never   Smokeless tobacco: Never  Vaping Use   Vaping status: Never Used  Substance and Sexual Activity   Alcohol use: No   Drug use: No   Sexual activity: Yes  Other Topics Concern   Not on file  Social History Narrative   Married   Social Drivers of Health   Financial Resource Strain: Not on file  Food Insecurity: No Food Insecurity (03/13/2023)   Hunger Vital Sign    Worried About Running Out of Food in the Last Year: Never true    Ran Out  of Food in the Last Year: Never true  Transportation Needs: No Transportation Needs (03/13/2023)   PRAPARE - Administrator, Civil Service (Medical): No    Lack of Transportation (Non-Medical): No  Physical Activity: Not on file  Stress: Not on file  Social Connections: Unknown (10/19/2022)   [facility]   Social Network    Social Network: Not on file  Intimate Partner Violence: Not At Risk (03/13/2023)   Humiliation, Afraid, Rape, and Kick questionnaire    Fear of Current or Ex-Partner: No    Emotionally Abused: No    Physically Abused: No    Sexually Abused: No   Current Outpatient Medications on File Prior to Visit  Medication Sig Dispense Refill   Accu-Chek Softclix Lancets lancets Use to check glucose once a day 100 each 12   amoxicillin-clavulanate (AUGMENTIN) 875-125 MG tablet Take 1 tablet by mouth every 12 (twelve) hours for 7 days. (Patient not taking: Reported on 10/23/2023) 14 tablet 0   aspirin EC 81 MG tablet Take 1 tablet (81 mg total) by mouth daily. Swallow whole. (Patient taking differently: Take 81 mg by mouth in the morning. Swallow whole.) 30 tablet 1   Blood Glucose Monitoring Suppl (ACCU-CHEK GUIDE) w/Device KIT Use to check glucose once a day 1 kit 0   carvedilol (COREG) 12.5 MG tablet Take 1 tablet (12.5 mg total) by mouth 2 (two) times daily with a meal. 180 tablet 1   Cholecalciferol (VITAMIN D3) 1.25 MG (50000 UT) CAPS Take 1 capsule by mouth once a week. 12 capsule 0   Cholecalciferol (VITAMIN D3) 20 MCG (800 UNIT) TABS Take 1 tablet by mouth daily. (Patient not taking: Reported on 10/23/2023) 30 tablet 5   Continuous Glucose Sensor (FREESTYLE LIBRE 3 PLUS SENSOR) MISC Apply to skin every 15 days as directed. 6 each 3   glucose blood (ACCU-CHEK GUIDE TEST) test strip Use to check glucose once a day 100 each 12   insulin degludec (TRESIBA FLEXTOUCH) 100 UNIT/ML FlexTouch Pen Inject 20 Units into the skin daily. 30 mL 3    insulin degludec (TRESIBA) 100 UNIT/ML FlexTouch Pen Inject 20 Units into the skin daily. 30 mL 3   Insulin Lispro-aabc (LYUMJEV KWIKPEN) 100 UNIT/ML KwikPen Inject 6-8 Units into the skin 3 (three) times daily before meals. 30 mL 3   Insulin Lispro-aabc (LYUMJEV KWIKPEN) 100 UNIT/ML KwikPen Inject 6-8 Units into the skin 3 (three) times daily before meals. 30 mL 3   methocarbamol (ROBAXIN) 500 MG tablet Take 1 tablet (500 mg total) by mouth  every 6 (six) hours as needed. (Patient not taking: Reported on 10/23/2023) 40 tablet 0   nitroGLYCERIN (NITROSTAT) 0.4 MG SL tablet Place 1 tablet (0.4 mg total) under the tongue every 5 (five) minutes as needed for chest pain. (Patient not taking: Reported on 08/05/2023) 45 tablet 3   ondansetron (ZOFRAN-ODT) 4 MG disintegrating tablet Dissolve 1 tablet (4 mg total) by mouth every 8 (eight) hours as needed for nausea or vomiting. (Patient not taking: Reported on 10/23/2023) 20 tablet 0   oxyCODONE (OXY IR/ROXICODONE) 5 MG immediate release tablet Take 1 tablet (5 mg total) by mouth every 6 (six) hours as needed for severe pain (pain score 7-10). (Patient not taking: Reported on 10/23/2023) 20 tablet 0   pantoprazole (PROTONIX) 40 MG tablet Take 1 tablet (40 mg total) by mouth 2 (two) times daily. (Patient not taking: Reported on 10/23/2023) 60 tablet 11   Risankizumab-rzaa (SKYRIZI) 360 MG/2.4ML SOCT Inject 2.4 mLs into the skin every 8 (eight) weeks. 2.4 mL 4   rosuvastatin (CRESTOR) 40 MG tablet Take 1 tablet (40 mg total) by mouth daily. 90 tablet 3   ticagrelor (BRILINTA) 90 MG TABS tablet Take 1 tablet (90 mg) by mouth 2 times daily. 180 tablet 1   traMADol (ULTRAM) 50 MG tablet Take 1 tablet (50 mg total) by mouth every 6 (six) hours as needed. 30 tablet 0   Zinc 30 MG TABS Take 1 tablet (30 mg total) by mouth daily.     Current Facility-Administered Medications on File Prior to Visit  Medication Dose Route Frequency Provider Last Rate Last Admin   Insulin Pen  Needle (NOVOFINE) 10 each  1 packet Subcutaneous QHS Deliah Boston L, PA-C       Allergies  Allergen Reactions   Statins Other (See Comments)    Myalgias, tolerates rosuvastatin     Family History  Problem Relation Age of Onset   Hypertension Mother    Cancer Mother    Hypertension Father    Inflammatory bowel disease Brother    Diabetes Paternal Uncle    Diabetes Paternal Grandmother    Diabetes Paternal Grandfather    Heart disease Neg Hx    Colon cancer Neg Hx    Esophageal cancer Neg Hx    Rectal cancer Neg Hx    Stomach cancer Neg Hx    PE: BP 122/70   Pulse 95   Ht 5\' 9"  (1.753 m)   Wt 211 lb (95.7 kg)   SpO2 99%   BMI 31.16 kg/m  Wt Readings from Last 3 Encounters:  10/28/23 211 lb (95.7 kg)  10/23/23 209 lb (94.8 kg)  08/19/23 198 lb 9.6 oz (90.1 kg)   Constitutional: normal weight, in NAD Eyes:  EOMI, no exophthalmos ENT: no neck masses, no cervical lymphadenopathy Cardiovascular: tachycardia, RR, No MRG Respiratory: CTA B Musculoskeletal: no deformities Skin:+ Generalized leathery rash from [facility] Neurological: no tremor with outstretched hands  ASSESSMENT: 1. DM2, insulin-dependent, uncontrolled, with complications: - CAD, with h/o AMI - s/p PCI 09/2022 - mild CKD  2. HL  PLAN:  1. Patient with longstanding, uncontrolled, type 2 diabetes, whom I saw for the first time 2.5 months ago.  At that time, he was off his basal/bolus insulin regimen and HbA1c increased from 8.9% to 11.5% and then 12.0%.  He did mention that he changed his diet before the visit.  He had a CGM previously but he came off several months before the appointment and did not restart.  He was  not checking his blood sugars.  His diabetes control was significantly impacted by his ulcerative colitis and a history of large colon resection.  He had a history of pelvic abscesses and fistula formation and he was on a biologic.  Of note, we need to avoid medications with GI side effects for  him.  He could not tolerate metformin in the past. - At last visit we discussed about the health risks of having such high blood sugars.  I recommended to restart the CGM right away.  I also sent a prescription for glucometer and supplies to his pharmacy.  We also discussed about starting back on a basal-bolus insulin regimen at low doses and increase the doses as needed.  He was telling me that he still had [place] and Humalog at home at that time.  I did recommend that if he ran out of Humalog, to use Lyumjev.  At last visit I recommended to vary the dose of Humalog insulin based on the size of his meals and I did not give him a sliding scale on purpose, as I wanted him to focus on bolusing based on the size of the meals, and not the sugars before meals. - After sugars improved, we will need to check him for insulin deficiency. -At today's visit, he still not on the sensor, as he was not able to obtain it from the pharmacy due to price.  He did change insurance and he is now able to obtain it taking only $30 per month.  He will start it today. -Sugars at home appear to be improved, but they are still above target before meals.  We discussed about increasing his Tresiba dose and also Humalog doses with larger meals.  I did recommend against juicing or at least to try to eliminate the pineapple in his juice to prevent hyperglycemic spikes. - I suggested to:  Patient Instructions  Please increase: - Tresiba 28 units daily - Lyumjev 6-10 units right before meals  Stop pineapple in juice. Ideally stop juice.  Please return in 3 months.  - we checked his HbA1c: 10.4% (lower) - advised to check sugars at different times of the day - 4x a day, rotating check times - advised for yearly eye exams >> he is not UTD but has an appointment coming up towards the end of the month - at last visit, his ACR was elevated.  Will recheck this today.  I am hoping that his Crohn's disease flare is not going to influence  it. - return to clinic in 3 months  2. HL - Latest lipid panel was reviewed from 01/2023: LDL improved, only slightly higher than our goal of less than 55, otherwise fractions at goal: Lab Results  Component Value Date   CHOL 130 02/03/2023   HDL 46 02/03/2023   LDLCALC 61 02/03/2023   LDLDIRECT 76.0 06/01/2019   TRIG 130 02/03/2023   CHOLHDL 2.8 02/03/2023  - Will continue Crestor 40 mg daily-no side effects  Carlus Pavlov, MD PhD [facility]

## 2023-10-28 NOTE — Patient Instructions (Addendum)
 Please increase: - Tresiba 28 units daily - Lyumjev 6-10 units right before meals  Stop pineapple in juice. Ideally stop juice.  Please return in 3 months.

## 2023-10-29 ENCOUNTER — Other Ambulatory Visit

## 2023-10-29 DIAGNOSIS — E1159 Type 2 diabetes mellitus with other circulatory complications: Secondary | ICD-10-CM | POA: Diagnosis not present

## 2023-10-29 DIAGNOSIS — E1165 Type 2 diabetes mellitus with hyperglycemia: Secondary | ICD-10-CM | POA: Diagnosis not present

## 2023-10-30 ENCOUNTER — Encounter: Payer: Self-pay | Admitting: Internal Medicine

## 2023-10-30 LAB — MICROALBUMIN / CREATININE URINE RATIO
Creatinine, Urine: 105 mg/dL (ref 20–320)
Microalb Creat Ratio: 138 mg/g{creat} — ABNORMAL HIGH (ref <30)
Microalb, Ur: 14.5 mg/dL

## 2023-10-31 ENCOUNTER — Other Ambulatory Visit: Payer: Self-pay

## 2023-10-31 NOTE — Progress Notes (Signed)
 Specialty Pharmacy Refill Coordination Note  Sean Schaefer is a 58 y.o. male contacted today regarding refills of specialty medication(s) Risankizumab-rzaa Cristy Folks)   Patient requested Sean Schaefer at [facility] at [place] date: 11/07/23   Medication will be filled on 04.17.25.

## 2023-11-06 ENCOUNTER — Other Ambulatory Visit: Payer: Self-pay

## 2023-11-10 DIAGNOSIS — E113291 Type 2 diabetes mellitus with mild nonproliferative diabetic retinopathy without macular edema, right eye: Secondary | ICD-10-CM | POA: Diagnosis not present

## 2023-11-10 LAB — HM DIABETES EYE EXAM

## 2023-11-12 DIAGNOSIS — E11628 Type 2 diabetes mellitus with other skin complications: Secondary | ICD-10-CM | POA: Diagnosis not present

## 2023-11-12 DIAGNOSIS — K5000 Crohn's disease of small intestine without complications: Secondary | ICD-10-CM | POA: Diagnosis not present

## 2023-11-12 DIAGNOSIS — Z794 Long term (current) use of insulin: Secondary | ICD-10-CM | POA: Diagnosis not present

## 2023-11-12 DIAGNOSIS — K50913 Crohn's disease, unspecified, with fistula: Secondary | ICD-10-CM | POA: Diagnosis not present

## 2023-11-16 ENCOUNTER — Other Ambulatory Visit: Payer: Self-pay | Admitting: Nurse Practitioner

## 2023-11-17 ENCOUNTER — Other Ambulatory Visit (HOSPITAL_COMMUNITY): Payer: Self-pay

## 2023-11-17 ENCOUNTER — Telehealth: Payer: Self-pay

## 2023-11-17 ENCOUNTER — Other Ambulatory Visit: Payer: Self-pay

## 2023-11-17 DIAGNOSIS — K50813 Crohn's disease of both small and large intestine with fistula: Secondary | ICD-10-CM

## 2023-11-17 MED ORDER — SKYRIZI 360 MG/2.4ML ~~LOC~~ SOCT
2.4000 mL | SUBCUTANEOUS | 4 refills | Status: DC
  Filled 2023-11-21: qty 2.4, fill #0
  Filled 2023-12-19 (×3): qty 2.4, 42d supply, fill #0

## 2023-11-17 NOTE — Telephone Encounter (Signed)
 New order for Skyrizi  360 mg every 6 weeks sent to [facility].

## 2023-11-17 NOTE — Telephone Encounter (Signed)
 Pharmacy Patient Advocate Encounter   Received notification from Physician's Office that prior authorization for Skyrizi  360MG /2.4ML (150MG /ML) single-dose prefilled cartridge with on-body injector is required/requested.   Insurance verification completed.   The patient is insured through [facility] .   Per test claim: PA required; PA submitted to above mentioned insurance via CoverMyMeds Key/confirmation #/EOC [id number] Status is pending

## 2023-11-17 NOTE — Telephone Encounter (Signed)
 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  Hoping to get his Skyrizi  increased from 360 mg every 8 weeks to 360 mg every 6 weeks. Please see if insurance authorization for that can be obtained.  Memory Staggers MD

## 2023-11-18 ENCOUNTER — Other Ambulatory Visit (HOSPITAL_COMMUNITY): Payer: Self-pay

## 2023-11-19 ENCOUNTER — Other Ambulatory Visit: Payer: Self-pay

## 2023-11-19 ENCOUNTER — Other Ambulatory Visit (HOSPITAL_COMMUNITY): Payer: Self-pay

## 2023-11-19 ENCOUNTER — Telehealth: Payer: Self-pay | Admitting: Cardiology

## 2023-11-19 MED ORDER — CARVEDILOL 12.5 MG PO TABS
12.5000 mg | ORAL_TABLET | Freq: Two times a day (BID) | ORAL | 0 refills | Status: DC
  Filled 2023-11-19: qty 180, 90d supply, fill #0

## 2023-11-19 NOTE — Telephone Encounter (Signed)
*  STAT* If patient is at the pharmacy, call can be transferred to refill team.   1. Which medications need to be refilled? (please list name of each medication and dose if known) carvedilol  (COREG ) 12.5 MG tablet    2. Would you like to learn more about the convenience, safety, & potential cost savings by using the [facility]?  Yes    3. Are you open to using the [facility] (Type [facility]. Yes    4. Which pharmacy/location (including street and city if local pharmacy) is medication to be sent to? [place] [facility]    5. Do they need a 30 day or 90 day supply? 90

## 2023-11-19 NOTE — Telephone Encounter (Signed)
 Pt's medication was sent to pt's pharmacy as requested. Confirmation received.

## 2023-11-21 ENCOUNTER — Other Ambulatory Visit: Payer: Self-pay

## 2023-11-21 NOTE — Progress Notes (Signed)
 Prescription received for every 6 weeks dosing. Changed to 42 day recurrence and retimed next outreach. Prescription is profiled.

## 2023-11-21 NOTE — Telephone Encounter (Signed)
 Pharmacy Patient Advocate Encounter  Received notification from [facility] that Prior Authorization for Skyrizi  360MG /2.4ML (150MG /ML) single-dose prefilled cartridge with on-body injector has been APPROVED from 11-19-2023 to 11-17-2024   PA #/Case ID/Reference #: [id number]

## 2023-11-27 NOTE — Telephone Encounter (Signed)
 Your request has been approved  The request has been approved. The authorization is effective for a maximum of 9 fills from 11/19/2023 to 11/17/2024, as long as the member is enrolled in their current health plan. The request was reviewed and approved by a licensed clinical pharmacist. This request was approved with a quantity limit of 2.65mL per 42 day supply.This medication must be filled at [facility]. Please call [phone] for assistance. A written notification letter will follow with additional details.

## 2023-11-27 NOTE — Telephone Encounter (Signed)
 Skyrizi  every 6 week dosing approved by insurance.

## 2023-12-10 ENCOUNTER — Other Ambulatory Visit (HOSPITAL_COMMUNITY): Payer: Self-pay

## 2023-12-18 ENCOUNTER — Other Ambulatory Visit (HOSPITAL_COMMUNITY): Payer: Self-pay

## 2023-12-19 ENCOUNTER — Other Ambulatory Visit: Payer: Self-pay | Admitting: Pharmacy Technician

## 2023-12-19 ENCOUNTER — Other Ambulatory Visit (HOSPITAL_COMMUNITY): Payer: Self-pay

## 2023-12-19 ENCOUNTER — Other Ambulatory Visit: Payer: Self-pay

## 2023-12-19 NOTE — Progress Notes (Signed)
 Specialty Pharmacy Refill Coordination Note  Sean Schaefer is a 58 y.o. male contacted today regarding refills of specialty medication(s) Risankizumab -rzaa (Skyrizi )   Patient requested (Patient-Rptd) Pickup at [facility] at [place] date: 12/19/23   Medication will be filled on 12/19/23.

## 2023-12-24 ENCOUNTER — Other Ambulatory Visit: Payer: Self-pay | Admitting: Pharmacist

## 2023-12-24 ENCOUNTER — Other Ambulatory Visit: Payer: Self-pay

## 2023-12-24 DIAGNOSIS — K50813 Crohn's disease of both small and large intestine with fistula: Secondary | ICD-10-CM

## 2023-12-24 MED ORDER — SKYRIZI 360 MG/2.4ML ~~LOC~~ SOCT
2.4000 mL | SUBCUTANEOUS | 4 refills | Status: DC
  Filled 2023-12-24 – 2024-01-22 (×2): qty 2.4, 42d supply, fill #0
  Filled 2024-03-05: qty 2.4, 42d supply, fill #1
  Filled 2024-04-19: qty 2.4, 42d supply, fill #2
  Filled 2024-05-27: qty 2.4, 42d supply, fill #3

## 2024-01-09 ENCOUNTER — Other Ambulatory Visit (HOSPITAL_COMMUNITY): Payer: Self-pay

## 2024-01-20 ENCOUNTER — Other Ambulatory Visit: Payer: Self-pay

## 2024-01-20 ENCOUNTER — Other Ambulatory Visit (HOSPITAL_COMMUNITY): Payer: Self-pay

## 2024-01-20 ENCOUNTER — Ambulatory Visit: Attending: Cardiology | Admitting: Cardiology

## 2024-01-20 ENCOUNTER — Encounter: Payer: Self-pay | Admitting: Cardiology

## 2024-01-20 VITALS — BP 100/78 | HR 86 | Ht 70.0 in | Wt 207.0 lb

## 2024-01-20 DIAGNOSIS — I2510 Atherosclerotic heart disease of native coronary artery without angina pectoris: Secondary | ICD-10-CM

## 2024-01-20 DIAGNOSIS — E785 Hyperlipidemia, unspecified: Secondary | ICD-10-CM | POA: Diagnosis not present

## 2024-01-20 DIAGNOSIS — I77810 Thoracic aortic ectasia: Secondary | ICD-10-CM | POA: Insufficient documentation

## 2024-01-20 DIAGNOSIS — I10 Essential (primary) hypertension: Secondary | ICD-10-CM | POA: Diagnosis not present

## 2024-01-20 DIAGNOSIS — E1165 Type 2 diabetes mellitus with hyperglycemia: Secondary | ICD-10-CM

## 2024-01-20 DIAGNOSIS — Z955 Presence of coronary angioplasty implant and graft: Secondary | ICD-10-CM | POA: Diagnosis not present

## 2024-01-20 DIAGNOSIS — Z794 Long term (current) use of insulin: Secondary | ICD-10-CM

## 2024-01-20 DIAGNOSIS — E1169 Type 2 diabetes mellitus with other specified complication: Secondary | ICD-10-CM | POA: Diagnosis not present

## 2024-01-20 DIAGNOSIS — I214 Non-ST elevation (NSTEMI) myocardial infarction: Secondary | ICD-10-CM | POA: Diagnosis not present

## 2024-01-20 DIAGNOSIS — I25110 Atherosclerotic heart disease of native coronary artery with unstable angina pectoris: Secondary | ICD-10-CM | POA: Diagnosis not present

## 2024-01-20 MED ORDER — TICAGRELOR 60 MG PO TABS
60.0000 mg | ORAL_TABLET | Freq: Two times a day (BID) | ORAL | 3 refills | Status: AC
  Filled 2024-01-20: qty 180, 90d supply, fill #0
  Filled 2024-05-10: qty 180, 90d supply, fill #1

## 2024-01-20 NOTE — Progress Notes (Signed)
 Cardiology Office Note:  .   Date:  01/20/2024  ID:  Sean Schaefer, DOB [birth year], MRN [id number] PCP: Rexanne Ingle, MD  [facility] Providers Cardiologist:  Alm Clay, MD     Chief Complaint  Patient presents with   Follow-up    Annual follow-up   Coronary Artery Disease    No angina    Patient Profile: .     Sean Schaefer is a 58 y.o. male with a PMH notable for CAD/non-STEMI (DES PCI to LAD and PTCA of small caliber LCx), DM-2 HTN, HLD, Crohn's disease who presents here for annual follow-up at the request of Rexanne Ingle, MD.  PMH: CAD: Non-STEMI 09/2022 => PTCA of LCx-OM1; DES PCI to LAD; EF by echo 70 to 75%.  No RWMA. Dilated thoracic aorta-on echo measured 43 mm. DM-2 Chron's HTN & HLD    Sean Schaefer was last seen on February 03, 2023 by Damien Braver, NP.  He had had a hospitalization in May 2024 for perianal abscess related to his Crohn's disease.  He was doing well from cardiac standpoint.  Active and exercising regularly.  Denying any angina.  And recently started Skyrizi  for his Crohn's.  Blood pressure was notably borderline low therefore his ARB was not reintroduced (-held in the setting of AKI with abscess) was continued on carvedilol  along with aspirin /Brilinta  and Crestor .  Plan was to reassess thoracic aorta in follow-up.  He was seen via telehealth conferencing by Barnie Hila, NP on July 01, 2023 for preop assessment.  He was doing well no cardiac symptoms.  Prior to his surgery [facility].  Per requesting office, was okay to proceed without holding aspirin  or Brilinta .  Subjective  Discussed the use of AI scribe software for clinical note transcription with the patient, who gave verbal consent to proceed.  History of Present Illness History of Present Illness Sean Schaefer is a 58 year old male with coronary artery disease who presents for follow-up of his cardiac condition.  He has a history of  coronary artery disease and has undergone angioplasty and stenting, with a stent placed in a large artery and balloon angioplasty performed on a smaller artery. He is currently taking carvedilol , and his blood pressure has been stable, although he experienced an incident of hypotension during rehabilitation after a workout. He is also on Brilinta , which has recently run out, and he requires a new prescription. He reports no recent heart problems, palpitations, trauma, shortness of breath, swelling in the legs, or dizziness.  Regarding his Crohn's disease, he reports no bleeding issues or changes in stool. However, Skyrizi , a medication he is taking, has been affecting his blood glucose levels, causing hyperglycemia. He has consulted a new endocrinologist to adjust his insulin  dosing.  In terms of his social history, he is physically active, exercising with his son, who is a Geophysicist/field seismologist in college. He also has another son who is 64 years old and engages in basketball training skills at the [facility].    Objective   CV medications - Carvedilol  12.5 mg twice daily - Brilinta  90 mg BID; - Aspirin  81 mg daily - Rosuvastatin  40 mg daily  -Tresiba  insulin  and lispro insulin   Studies Reviewed: SABRA   EKG Interpretation Date/Time:  Tuesday January 20 2024 08:15:30 EDT Ventricular Rate:  86 PR Interval:  162 QRS Duration:  72 QT Interval:  370 QTC Calculation: 442 R Axis:   -19  Text Interpretation: Normal sinus rhythm Normal ECG When compared with ECG of 12-Dec-2022 13:43, Premature ventricular complexes are no longer Present Criteria for Septal infarct are no longer Present Confirmed by Anner Lenis ([id number]) on 01/20/2024 8:24:25 AM    Lab Results  Component Value Date   CHOL 130 02/03/2023   HDL 46 02/03/2023   LDLCALC 61 02/03/2023   LDLDIRECT 76.0 06/01/2019   TRIG 130 02/03/2023   CHOLHDL 2.8 02/03/2023   Lab Results  Component Value Date   NA 130 (L) 08/18/2023   K 4.8 08/18/2023    CREATININE 1.21 08/18/2023   GFR 67.37 08/18/2023   GLUCOSE 352 (H) 08/18/2023   Lab Results  Component Value Date   HGBA1C 10.4 (A) 10/28/2023   ECHO: Normal/hyperdynamic LV with EF 70 to 75%.  No RWMA.  Indeterminate filling.  Normal RV.  AV sclerosis no stenosis.  Moderate dilation of ascending aorta measured 43 mm.  RAP ~15 mmHg.  (10/21/2022) CATH-PCI: (small caliber) Prox-Mid LCx 99%-OM1 100% => PTCA only -> 30%; ost-prox LAD 85% => Synergy XD DES 3 x 16 => 3.3 mm, mid-dis LAD 60% & dist LAD 60%, 30%. Prox RCA 25%, dist RCA 20% & PL2 60%. EF 55-60%.  (10/20/2022)     Risk Assessment/Calculations:               Physical Exam:   VS:  BP 100/78 (BP Location: Right Arm, Patient Position: Sitting, Cuff Size: Normal)   Pulse 86   Ht 5' 10 (1.778 m)   Wt 207 lb (93.9 kg)   BMI 29.70 kg/m    Wt Readings from Last 3 Encounters:  01/20/24 207 lb (93.9 kg)  10/28/23 211 lb (95.7 kg)  10/23/23 209 lb (94.8 kg)    GEN: Well nourished, well developed in no acute distress; healthy-appearing.  Well-groomed.  Borderline obese NECK: No JVD; No carotid bruits CARDIAC: Normal S1, S2; RRR, no murmurs, rubs, gallops RESPIRATORY:  Clear to auscultation without rales, wheezing or rhonchi ; nonlabored, good air movement. ABDOMEN: Soft, non-tender, non-distended EXTREMITIES:  No edema; No deformity      ASSESSMENT AND PLAN: .    Problem List Items Addressed This Visit       Cardiology Problems   Coronary artery disease involving native coronary artery of native heart without angina pectoris - Primary   Coronary artery disease with angioplasty and stent placement. No current symptoms. Blood pressure slightly low, managed with carvedilol .  - Discontinue aspirin -has been > one year post-stent placement. - Reduce Brilinta  dose to 60 mg twice daily - Monitor blood pressure and adjust carvedilol  dose as needed. - Reassess cholesterol levels. -Continue rosuvastatin  40 with them daily and  diabetes management by PCP. - Order blood work to check cholesterol and kidney function.      Relevant Orders   EKG 12-Lead (Completed)   Hepatic function panel   Lipid panel   CT ANGIO CHEST AORTA W/ & OR WO/CM & GATING (HEART & VASCULAR TOWER ONLY)   Dilation of thoracic aorta (HCC) (Chronic)   Slight dilation of the aorta noted on echo. Likely stable. - Order CTA-aorta-scan to evaluate aorta dilation. - Monitor aorta size with follow-up imaging as needed.      Relevant Orders   Hepatic function panel   Lipid panel   CT ANGIO CHEST AORTA W/ & OR WO/CM & GATING (HEART & VASCULAR TOWER ONLY)   Hyperlipidemia associated with type 2 diabetes mellitus (HCC) (Chronic)   Last labs checked were  from July 2024.  Pretty well-controlled with an LDL of 61. - Continue rosuvastatin  40 mg daily - Check FLP/CMP      Relevant Orders   Hepatic function panel   Lipid panel   NSTEMI (non-ST elevated myocardial infarction) (HCC) (Chronic)   15 months out from non-STEMI with two-vessel PTCA/PCI.  Doing well with no recurrent symptoms of angina.      Relevant Orders   Hepatic function panel   Lipid panel   Primary hypertension (Chronic)   BP pretty well-controlled with carvedilol  at 12.5 mg twice daily.  Blood pressure slightly low today but asymptomatic.  Has had some dizzy spells. - Continue carvedilol . - Adjust carvedilol  dose if experiencing hypotensive symptoms. => Take half dose from 1-2 doses.      Relevant Orders   Hepatic function panel   Lipid panel     Other   Status post coronary artery stent placement (Chronic)   Over 1 year out from PTCA/PCI. -Okay to DC aspirin  and reduce Brilinta  to maintenance dose-60 mg twice daily.      Uncontrolled type 2 diabetes mellitus with hyperglycemia, with long-term current use of insulin  (HCC) (Chronic)   Being managed by PCP and endocrinology has Tresiba  and lispro insulin .  Unfortunately he has Crohn's disease and is on Skyrizi  which  affects his blood sugars.      Relevant Orders   Hepatic function panel   Lipid panel           Follow-Up: Return in about 1 year (around 01/19/2025).     Signed, Alm MICAEL Clay, MD, MS Alm Clay, M.D., M.S. Interventional Chartered certified accountant  Pager # [phone]

## 2024-01-20 NOTE — Assessment & Plan Note (Signed)
 Slight dilation of the aorta noted on echo. Likely stable. - Order CTA-aorta-scan to evaluate aorta dilation. - Monitor aorta size with follow-up imaging as needed.

## 2024-01-20 NOTE — Assessment & Plan Note (Signed)
 15 months out from non-STEMI with two-vessel PTCA/PCI.  Doing well with no recurrent symptoms of angina.

## 2024-01-20 NOTE — Assessment & Plan Note (Signed)
 Last labs checked were from July 2024.  Pretty well-controlled with an LDL of 61. - Continue rosuvastatin  40 mg daily - Check FLP/CMP

## 2024-01-20 NOTE — Assessment & Plan Note (Signed)
 BP pretty well-controlled with carvedilol  at 12.5 mg twice daily.  Blood pressure slightly low today but asymptomatic.  Has had some dizzy spells. - Continue carvedilol . - Adjust carvedilol  dose if experiencing hypotensive symptoms. => Take half dose from 1-2 doses.

## 2024-01-20 NOTE — Patient Instructions (Signed)
  Medication Instructions:  Stop Aspirin    Decrease Brilinta  60 mg twice a day   *If you need a refill on your cardiac medications before your next appointment, please call your pharmacy*   Lab Work: Lipid Hepatic panel  If you have labs (blood work) drawn today and your tests are completely normal, you will receive your results only by: MyChart Message (if you have MyChart) OR A paper copy in the mail If you have any lab test that is abnormal or we need to change your treatment, we will call you to review the results.   Testing/Procedures:  Non-Cardiac CT Angiography (CTA), is a special type of CT scan that uses a computer to produce multi-dimensional views of major blood vessels throughout the body. In CT angiography, a contrast material is injected through an IV to help visualize the aorta   Follow-Up: At [facility], you and your health needs are our priority.  As part of our continuing mission to provide you with exceptional heart care, we have created designated Provider Care Teams.  These Care Teams include your primary Cardiologist (physician) and Advanced Practice Providers (APPs -  Physician Assistants and Nurse Practitioners) who all work together to provide you with the care you need, when you need it.     Your next appointment:   12 month(s)  The format for your next appointment:   In Person  Provider:   Alm Clay, MD   Other Instructions     Follow-Up: At [facility], you and your health needs are our priority.  As part of our continuing mission to provide you with exceptional heart care, we have created designated Provider Care Teams.  These Care Teams include your primary Cardiologist (physician) and Advanced Practice Providers (APPs -  Physician Assistants and Nurse Practitioners) who all work together to provide you with the care you need, when you need it.     Your next appointment:   12 month(s)  The format for your next appointment:    In Person  Provider:   Alm Clay, MD

## 2024-01-20 NOTE — Assessment & Plan Note (Signed)
 Over 1 year out from PTCA/PCI. -Okay to DC aspirin  and reduce Brilinta  to maintenance dose-60 mg twice daily.

## 2024-01-20 NOTE — Assessment & Plan Note (Signed)
 Coronary artery disease with angioplasty and stent placement. No current symptoms. Blood pressure slightly low, managed with carvedilol .  - Discontinue aspirin -has been > one year post-stent placement. - Reduce Brilinta  dose to 60 mg twice daily - Monitor blood pressure and adjust carvedilol  dose as needed. - Reassess cholesterol levels. -Continue rosuvastatin  40 with them daily and diabetes management by PCP. - Order blood work to check cholesterol and kidney function.

## 2024-01-20 NOTE — Assessment & Plan Note (Signed)
 Being managed by PCP and endocrinology has Tresiba  and lispro insulin .  Unfortunately he has Crohn's disease and is on Skyrizi  which affects his blood sugars.

## 2024-01-21 LAB — HEPATIC FUNCTION PANEL
ALT: 27 IU/L (ref 0–44)
AST: 31 IU/L (ref 0–40)
Albumin: 3.8 g/dL (ref 3.8–4.9)
Alkaline Phosphatase: 70 IU/L (ref 44–121)
Bilirubin Total: 0.7 mg/dL (ref 0.0–1.2)
Bilirubin, Direct: 0.22 mg/dL (ref 0.00–0.40)
Total Protein: 7.5 g/dL (ref 6.0–8.5)

## 2024-01-21 LAB — LIPID PANEL
Chol/HDL Ratio: 2.8 ratio (ref 0.0–5.0)
Cholesterol, Total: 100 mg/dL (ref 100–199)
HDL: 36 mg/dL — ABNORMAL LOW (ref >39)
LDL Chol Calc (NIH): 34 mg/dL (ref 0–99)
Triglycerides: 181 mg/dL — ABNORMAL HIGH (ref 0–149)
VLDL Cholesterol Cal: 30 mg/dL (ref 5–40)

## 2024-01-22 ENCOUNTER — Other Ambulatory Visit: Payer: Self-pay

## 2024-01-22 ENCOUNTER — Other Ambulatory Visit (HOSPITAL_COMMUNITY): Payer: Self-pay

## 2024-01-22 NOTE — Progress Notes (Signed)
 Specialty Pharmacy Refill Coordination Note  KONG PACKETT is a 58 y.o. male contacted today regarding refills of specialty medication(s) Risankizumab -rzaa (Skyrizi )   Patient requested [name] at [facility] at [place] date: 01/30/24   Medication will be filled on 07.09.25.

## 2024-01-26 ENCOUNTER — Other Ambulatory Visit (HOSPITAL_COMMUNITY): Payer: Self-pay

## 2024-01-27 ENCOUNTER — Ambulatory Visit (HOSPITAL_COMMUNITY)
Admission: RE | Admit: 2024-01-27 | Discharge: 2024-01-27 | Disposition: A | Discharge status: Home / Self Care | Source: Ambulatory Visit | Attending: Cardiology | Admitting: Cardiology

## 2024-01-27 DIAGNOSIS — I2510 Atherosclerotic heart disease of native coronary artery without angina pectoris: Secondary | ICD-10-CM | POA: Diagnosis not present

## 2024-01-27 DIAGNOSIS — K8020 Calculus of gallbladder without cholecystitis without obstruction: Secondary | ICD-10-CM | POA: Diagnosis not present

## 2024-01-27 DIAGNOSIS — I77810 Thoracic aortic ectasia: Secondary | ICD-10-CM | POA: Insufficient documentation

## 2024-01-27 MED ORDER — IOHEXOL 350 MG/ML SOLN
75.0000 mL | Freq: Once | INTRAVENOUS | Status: AC | PRN
  Administered 2024-01-27: 75 mL via INTRAVENOUS

## 2024-01-28 ENCOUNTER — Other Ambulatory Visit (HOSPITAL_COMMUNITY): Payer: Self-pay

## 2024-01-28 ENCOUNTER — Other Ambulatory Visit: Payer: Self-pay

## 2024-01-29 ENCOUNTER — Ambulatory Visit: Payer: Self-pay | Admitting: Cardiology

## 2024-01-30 ENCOUNTER — Encounter: Payer: Self-pay | Admitting: Internal Medicine

## 2024-01-30 ENCOUNTER — Other Ambulatory Visit (HOSPITAL_COMMUNITY): Payer: Self-pay

## 2024-01-30 ENCOUNTER — Ambulatory Visit: Admitting: Internal Medicine

## 2024-01-30 VITALS — BP 122/80 | HR 83 | Ht 70.0 in | Wt 212.6 lb

## 2024-01-30 DIAGNOSIS — E1159 Type 2 diabetes mellitus with other circulatory complications: Secondary | ICD-10-CM | POA: Diagnosis not present

## 2024-01-30 DIAGNOSIS — E1165 Type 2 diabetes mellitus with hyperglycemia: Secondary | ICD-10-CM | POA: Diagnosis not present

## 2024-01-30 DIAGNOSIS — Z794 Long term (current) use of insulin: Secondary | ICD-10-CM | POA: Diagnosis not present

## 2024-01-30 DIAGNOSIS — E7849 Other hyperlipidemia: Secondary | ICD-10-CM | POA: Diagnosis not present

## 2024-01-30 LAB — POCT GLYCOSYLATED HEMOGLOBIN (HGB A1C): Hemoglobin A1C: 12.6 % — AB (ref 4.0–5.6)

## 2024-01-30 MED ORDER — INSULIN DEGLUDEC 100 UNIT/ML ~~LOC~~ SOPN
28.0000 [IU] | PEN_INJECTOR | Freq: Every day | SUBCUTANEOUS | 3 refills | Status: DC
  Filled 2024-01-30: qty 24, 86d supply, fill #0

## 2024-01-30 MED ORDER — INSULIN PEN NEEDLE 32G X 4 MM MISC
1.0000 | Freq: Four times a day (QID) | 3 refills | Status: AC
  Filled 2024-01-30: qty 400, 90d supply, fill #0

## 2024-01-30 NOTE — Progress Notes (Signed)
 Patient ID: Sean Schaefer, male   DOB: 1966/03/31, 58 y.o.   MRN: [id number]  HPI: Sean Schaefer is a 58 y.o.-year-old male, initially referred by Dr. Lonni Pizza (colorectal surgery), for management of DM2, dx in 2015-16, insulin -dependent few years later, uncontrolled, with complications (CAD, h/o AMI; mild CKD).  Last visit 3 months ago. He saw Dr. Von.  Interim hx: He has another Crohn's flair. He is on Skyrizi , but feels sugars increased after starting this.  His doses have been moved closer together, now every 6 weeks, as the disease is not controlled. He mentions he has a lot of pelvic pain today. No increased urination, blurry vision, nausea, chest pain. He continues to try to go to the gym but has fatigue.  He is not sleeping well.  Reviewed HbA1c: Lab Results  Component Value Date   HGBA1C 10.4 (A) 10/28/2023   HGBA1C 12.0 (A) 08/19/2023   HGBA1C 11.5 (H) 06/26/2023   HGBA1C 8.9 (H) 03/12/2023   HGBA1C 12.1 (H) 10/21/2022   HGBA1C 12.1 (H) 10/20/2022   HGBA1C 8.6 (H) 01/30/2021   HGBA1C 10.7 (H) 12/01/2020   HGBA1C 9.4 Repeated and verified X2. (H) 12/10/2019   HGBA1C 7.6 (H) 09/07/2019   At last visit, he was off his insulins and we restarted: - Tresiba  38 >> off >> 20 >> 28 units daily - not in last 3 weeks - Humalog  10 units 3x a day, with meals >> Lyumjev  6-8 >> 6-10 units right before the meal >> not taking He was started on Metformin  >> could not take it 2/2 diarrhea.  He restarted the freestyle libre since last visit:  Previously: - am: 90, 160-180, 209 - 2h after b'fast: n/c - before lunch: 180-190 - 2h after lunch: n/c - before dinner: 180-190 - 2h after dinner: n/c - bedtime: 200-210 - nighttime: n/c Lowest sugar was ? (Dizziness, resolved with glucose tablets). Highest sugar was upper 200s >> 209.  Glucometer: none  Pt's meals are: - Breakfast:skips or juice - Lunch: meat occasionally + veggies - Dinner: (before 6 pm): chicken + broccoli +  brown rice (1/3 cup) - Snacks:- Drinks Water , juice.  - + mild CKD, last BUN/creatinine:  Lab Results  Component Value Date   BUN 16 08/18/2023   BUN 14 07/03/2023   CREATININE 1.21 08/18/2023   CREATININE 1.34 (H) 07/03/2023   Lab Results  Component Value Date   MICRALBCREAT 138 (H) 10/29/2023   MICRALBCREAT 156 (H) 08/19/2023   MICRALBCREAT 15.9 12/17/2017   MICRALBCREAT <5.1 10/22/2016  He is not on an ACE inhibitor or ARB.  -+ HL; last set of lipids: Lab Results  Component Value Date   CHOL 100 01/20/2024   HDL 36 (L) 01/20/2024   LDLCALC 34 01/20/2024   LDLDIRECT 76.0 06/01/2019   TRIG 181 (H) 01/20/2024   CHOLHDL 2.8 01/20/2024  He is on Crestor  40 mg daily.  - last eye exam was in 10/2023 No DR reportedly. [facility].  - no numbness and tingling in his feet.  Last foot exam 10/28/2023. + L hand tingling.  Pt has FH of DM in PGF.  He has a history of HTN, aortic dilation, Crohn's disease/Ulcerative colitis - prev. On Humira , now on Skyrizi , with perianal abscesses. He had the whole large colon removed at 58 y/o [facility]). He was in the hospital for 9 mo. 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 He estimates he had a total of >25 surgeries, including one for  testicle removal.  1.5 years ago: 260 lbs >> changed diet >> lost weight to 190s.  ROS: + see HPI  Past Medical History:  Diagnosis Date   CAD S/P PCI to LAD and PTCA of small caliber LCx) 09/2022   Crohn's disease (HCC)    Diabetes mellitus (HCC)    Dilation of aorta (HCC)    Hypertension    Myocardial infarction (HCC) 09/2022   Non-STEMI   Ulcerative colitis (HCC)     Social History   Socioeconomic History   Marital status: Married    Spouse name: Ricka   Number of children: 2   Years of education: Not on file   Highest education level: Not on file  Occupational History   Not on file  Tobacco Use   Smoking status: Never   Smokeless tobacco: Never  Vaping Use   Vaping status: Never  Used  Substance and Sexual Activity   Alcohol use: No   Drug use: No   Sexual activity: Yes  Other Topics Concern   Not on file  Social History Narrative   Married   Social Drivers of Health   Financial Resource Strain: Not on file  Food Insecurity: No Food Insecurity (03/13/2023)   Hunger Vital Sign    Worried About Running Out of Food in the Last Year: Never true    Ran Out of Food in the Last Year: Never true  Transportation Needs: No Transportation Needs (03/13/2023)   PRAPARE - Administrator, Civil Service (Medical): No    Lack of Transportation (Non-Medical): No  Physical Activity: Not on file  Stress: Not on file  Social Connections: Unknown (10/19/2022)   Received from [facility]   Social Network    Social Network: Not on file  Intimate Partner Violence: Not At Risk (03/13/2023)   Humiliation, Afraid, Rape, and Kick questionnaire    Fear of Current or Ex-Partner: No    Emotionally Abused: No    Physically Abused: No    Sexually Abused: No   Current Outpatient Medications on File Prior to Visit  Medication Sig Dispense Refill   Accu-Chek Softclix Lancets lancets Use to check glucose once a day 100 each 12   Blood Glucose Monitoring Suppl (ACCU-CHEK GUIDE) w/Device KIT Use to check glucose once a day 1 kit 0   carvedilol  (COREG ) 12.5 MG tablet Take 1 tablet (12.5 mg total) by mouth 2 (two) times daily with a meal. 180 tablet 0   Cholecalciferol  (VITAMIN D3) 20 MCG (800 UNIT) TABS Take 1 tablet by mouth daily. 30 tablet 5   Continuous Glucose Sensor (FREESTYLE LIBRE 3 PLUS SENSOR) MISC Apply to skin every 15 days as directed. 6 each 3   glucose blood (ACCU-CHEK GUIDE TEST) test strip Use to check glucose once a day 100 each 12   insulin  degludec (TRESIBA ) 100 UNIT/ML FlexTouch Pen Inject 28 Units into the skin daily.     Insulin  Lispro-aabc (LYUMJEV  KWIKPEN) 100 UNIT/ML KwikPen Inject 6-8 Units into the skin 3 (three) times daily before meals. 30 mL 3    nitroGLYCERIN  (NITROSTAT ) 0.4 MG SL tablet Place 1 tablet (0.4 mg total) under the tongue every 5 (five) minutes as needed for chest pain. 45 tablet 3   Risankizumab -rzaa (SKYRIZI ) 360 MG/2.4ML SOCT Inject 2.4 mLs into the skin every 6 (six) weeks. 2.4 mL 4   rosuvastatin  (CRESTOR ) 40 MG tablet Take 1 tablet (40 mg total) by mouth daily. 90 tablet 3   ticagrelor  (BRILINTA ) 60  MG TABS tablet Take 1 tablet (60 mg total) by mouth 2 (two) times daily. 180 tablet 3   traMADol  (ULTRAM ) 50 MG tablet Take 1 tablet (50 mg total) by mouth every 6 (six) hours as needed. 30 tablet 0   Zinc  30 MG TABS Take 1 tablet (30 mg total) by mouth daily.     Current Facility-Administered Medications on File Prior to Visit  Medication Dose Route Frequency Provider Last Rate Last Admin   Insulin  Pen Needle (NOVOFINE) 10 each  1 packet Subcutaneous QHS Gretta Sharper L, PA-C       Allergies  Allergen Reactions   Statins Other (See Comments)    Myalgias, tolerates rosuvastatin      Family History  Problem Relation Age of Onset   Hypertension Mother    Cancer Mother    Hypertension Father    Inflammatory bowel disease Brother    Diabetes Paternal Uncle    Diabetes Paternal Grandmother    Diabetes Paternal Grandfather    Heart disease Neg Hx    Colon cancer Neg Hx    Esophageal cancer Neg Hx    Rectal cancer Neg Hx    Stomach cancer Neg Hx    PE: BP 122/80   Pulse 83   Ht 5' 10 (1.778 m)   Wt 212 lb 9.6 oz (96.4 kg)   SpO2 97%   BMI 30.50 kg/m  Wt Readings from Last 3 Encounters:  01/30/24 212 lb 9.6 oz (96.4 kg)  01/20/24 207 lb (93.9 kg)  10/28/23 211 lb (95.7 kg)   Constitutional: normal weight, in NAD Eyes:  EOMI, no exophthalmos ENT: no neck masses, no cervical lymphadenopathy Cardiovascular: RRR, No MRG Respiratory: CTA B Musculoskeletal: no deformities Skin: no rash Neurological: no tremor with outstretched hands  ASSESSMENT: 1. DM2, insulin -dependent, uncontrolled, with  complications: - CAD, with h/o AMI - s/p PCI 09/2022 - mild CKD  2. HL  PLAN:  1. Patient with longstanding, uncontrolled, type 2 diabetes, with slightly improved HbA1c at last visit, at 10.4%, down from 12.0%, but still very elevated.  His diabetes control is significantly impacted by his ulcerative colitis and history of large colon resection.  He also has a history of pelvic abscesses with fistula formation and he was on a biologic agent in the past.  We need to avoid medications with the GI side effects for him.  He could not tolerate metformin  in the past. - At last visit, sugars appear to have improved, but he was still off the CGM as this was expensive for him.  However, he just changed insurance before last visit and was planning to start it right away.  As sugars were still above target before meals I advised him to increase his Tresiba  dose.  We also increased his Humalog  doses with larger meals.  He was juicing and I recommended against this or at least to eliminate the pineapple in his juice. CGM interpretation: -At today's visit, we reviewed his CGM downloads: It appears that no values are in target range (goal >70%), while 100% are higher than 180 (goal <25%).  The calculated average blood sugar is 386.  The projected HbA1c for the next 3 months (GMI) is 12.5%. -Reviewing the CGM trends, sugars appear to be all significantly above target, mostly in the 300 range and up.  Sugars are much worse compared to last visit and upon questioning, he is not taking his insulin  at all.  He mentions that he is not eating well, only eating  fruit or having maybe 1 meal a day, and is not taking Lyumjev  and before this.  He did not try to correct his high blood sugars with Lyumjev  as he mentions that he was not aware that he could take this only for correction. - At today's visit, we discussed about the many possible complications of uncontrolled diabetes, including his Crohn disease not healing and not  responding to medications, but also increased risk of heart attack, stroke.  Also, his lack of energy could definitely be related to his very high blood sugars.  I did advise him that he needs to take the Tresiba  every day, without missing doses.  Lyumjev  needs to be taken before meals, but if he is not eating meals, I advised him to check his blood sugars every 4 hours and use correction to bring the blood sugars down.  I gave him a sliding scale today.  We can adjust this at next visit if needed. - I suggested to:  Patient Instructions  Please restart: - Tresiba  28 units daily - Lyumjev  6-10 units right before meals  Try to check sugars every 4 hours and correct with Lyumjev  if needed: - 140-160: + 2 units  - 161-180: + 3 units  - 181-200: + 4 units  - 201-240: + 5 units  - 241-280: + 6 units  - 281-320: + 7 units  - 321-360: + 8 units  - >361: + 10 units   Please return in 1.5 months.  - we checked his HbA1c: 12.6% (higher) - advised to check sugars at different times of the day - 4x a day, rotating check times - advised for yearly eye exams >> he is UTD - at last visit, we rechecked his ACR and this was slightly better, but still elevated.  Will recheck this at next visit. - return to clinic in 1.5 months  2. HL - Latest lipid panel was reviewed from 01/2024: LDL at goal, improved, triglycerides slightly elevated, HDL slightly low Lab Results  Component Value Date   CHOL 100 01/20/2024   HDL 36 (L) 01/20/2024   LDLCALC 34 01/20/2024   LDLDIRECT 76.0 06/01/2019   TRIG 181 (H) 01/20/2024   CHOLHDL 2.8 01/20/2024  - Will continue Crestor  40 mg daily-no side effects  Lela Fendt, MD PhD [facility] Endocrinology

## 2024-01-30 NOTE — Patient Instructions (Signed)
 Please restart: - Tresiba  28 units daily - Lyumjev  6-10 units right before meals  Try to check sugars every 4 hours and correct with Lyumjev  if needed: - 140-160: + 2 units  - 161-180: + 3 units  - 181-200: + 4 units  - 201-240: + 5 units  - 241-280: + 6 units  - 281-320: + 7 units  - 321-360: + 8 units  - >361: + 10 units   Please return in 1.5 months.

## 2024-02-16 ENCOUNTER — Other Ambulatory Visit (HOSPITAL_COMMUNITY): Payer: Self-pay

## 2024-02-18 ENCOUNTER — Ambulatory Visit: Attending: Internal Medicine | Admitting: Pharmacist

## 2024-02-18 DIAGNOSIS — K50919 Crohn's disease, unspecified, with unspecified complications: Secondary | ICD-10-CM

## 2024-02-18 DIAGNOSIS — Z7189 Other specified counseling: Secondary | ICD-10-CM

## 2024-02-18 NOTE — Progress Notes (Signed)
   S: Patient presents for review of their specialty medication therapy.  Patient is currently taking Skyrizi  for Crohn's. Patient is managed by Dr. Legrand for this.   Adherence: confirms.   Efficacy: no improvement in symptomology . Pt is in an active flair currently. Sees his surgeon tomorrow (02/19/2024).   Dosing: -SubQ: 360 mg q12 weeks.  Monitoring: S/sx of infection: none S/sx of hypersensitivity/injection site reaction: none   O:     Lab Results  Component Value Date   WBC 4.7 08/18/2023   HGB 13.5 08/18/2023   HCT 40.9 08/18/2023   MCV 91.2 08/18/2023   PLT 170.0 08/18/2023      Chemistry      Component Value Date/Time   NA 130 (L) 08/18/2023 0847   NA 135 11/04/2022 0915   K 4.8 08/18/2023 0847   CL 98 08/18/2023 0847   CO2 26 08/18/2023 0847   BUN 16 08/18/2023 0847   BUN 21 11/04/2022 0915   CREATININE 1.21 08/18/2023 0847   CREATININE 1.25 01/08/2016 1611      Component Value Date/Time   CALCIUM  8.8 08/18/2023 0847   ALKPHOS 70 01/20/2024 0911   AST 31 01/20/2024 0911   ALT 27 01/20/2024 0911   BILITOT 0.7 01/20/2024 0911       A/P: 1. Medication review: Patient currently on Skyrizi  for Crohn's. Reviewed the medication with the patient, including the following: Skyrizi  is a monoclonal antibody used in the treatment of Crohn's. Patient educated on purpose, proper use and potential adverse effects of Skyrizi . Possible adverse effects are infections, headache, and injection site reactions. Live vaccinations should be avoided while on therapy. SubQ: Administer the two consecutive injections subcutaneously at different anatomic locations, such as thighs, abdomen, or back of upper arms. Do not inject into areas where the skin is tender, bruised, red, hard, or affected by psoriasis. Intended for use under supervision of a health care professional; self-injection may occur after proper training (except back of upper arms). No recommendations for any changes at  this time.  Herlene Fleeta Morris, PharmD, JAQUELINE, CPP Clinical Pharmacist [facility] [phone]

## 2024-02-19 ENCOUNTER — Other Ambulatory Visit (HOSPITAL_BASED_OUTPATIENT_CLINIC_OR_DEPARTMENT_OTHER): Payer: Self-pay

## 2024-02-19 ENCOUNTER — Other Ambulatory Visit (HOSPITAL_COMMUNITY): Payer: Self-pay

## 2024-02-19 DIAGNOSIS — K5000 Crohn's disease of small intestine without complications: Secondary | ICD-10-CM | POA: Diagnosis not present

## 2024-02-19 DIAGNOSIS — L0231 Cutaneous abscess of buttock: Secondary | ICD-10-CM | POA: Diagnosis not present

## 2024-02-19 MED ORDER — AMOXICILLIN-POT CLAVULANATE 875-125 MG PO TABS
1.0000 | ORAL_TABLET | Freq: Two times a day (BID) | ORAL | 0 refills | Status: DC
  Filled 2024-02-19: qty 14, 7d supply, fill #0

## 2024-03-01 ENCOUNTER — Other Ambulatory Visit: Payer: Self-pay | Admitting: Cardiology

## 2024-03-02 ENCOUNTER — Other Ambulatory Visit (HOSPITAL_COMMUNITY): Payer: Self-pay

## 2024-03-02 MED ORDER — CARVEDILOL 12.5 MG PO TABS
12.5000 mg | ORAL_TABLET | Freq: Two times a day (BID) | ORAL | 3 refills | Status: AC
  Filled 2024-03-02: qty 180, 90d supply, fill #0
  Filled 2024-06-23: qty 180, 90d supply, fill #1

## 2024-03-05 ENCOUNTER — Other Ambulatory Visit: Payer: Self-pay

## 2024-03-05 NOTE — Progress Notes (Signed)
 Specialty Pharmacy Refill Coordination Note  Sean Schaefer is a 58 y.o. male contacted today regarding refills of specialty medication(s) Risankizumab -rzaa (Skyrizi )   Patient requested [name] at [facility] at [place] date: 03/08/24   Medication will be filled on 03/05/24.

## 2024-03-18 ENCOUNTER — Encounter: Payer: Self-pay | Admitting: Internal Medicine

## 2024-03-18 ENCOUNTER — Ambulatory Visit: Admitting: Internal Medicine

## 2024-03-18 VITALS — BP 130/70 | HR 95 | Ht 70.0 in | Wt 216.0 lb

## 2024-03-18 DIAGNOSIS — E7849 Other hyperlipidemia: Secondary | ICD-10-CM

## 2024-03-18 DIAGNOSIS — E1165 Type 2 diabetes mellitus with hyperglycemia: Secondary | ICD-10-CM

## 2024-03-18 DIAGNOSIS — E1159 Type 2 diabetes mellitus with other circulatory complications: Secondary | ICD-10-CM | POA: Diagnosis not present

## 2024-03-18 DIAGNOSIS — Z794 Long term (current) use of insulin: Secondary | ICD-10-CM | POA: Diagnosis not present

## 2024-03-18 NOTE — Progress Notes (Signed)
 Patient ID: Sean Schaefer, male   DOB: 02/11/1966, 58 y.o.   MRN: [id number]  HPI: Sean Schaefer is a 58 y.o.-year-old male, initially referred by Dr. Lonni Pizza (colorectal surgery), for management of DM2, dx in 2015-16, insulin -dependent few years later, uncontrolled, with complications (CAD, h/o AMI; mild CKD).  Last visit 3 months ago. He saw Dr. Von.  Interim hx: At last visit he had another Crohn's flair. He started Skyrizi , and felt that his sugars increased after starting this.  He continues on this, every 6 weeks. No increased urination, blurry vision, nausea, chest pain.  However, he has bothersome diarrhea and stool incontinence during the night.  He is very bothered by this.  He did not discuss with GI recently.  He feels the Tresiba  may be causing his diarrhea, as he is injecting in the stomach.  Reviewed HbA1c: Lab Results  Component Value Date   HGBA1C 12.6 (A) 01/30/2024   HGBA1C 10.4 (A) 10/28/2023   HGBA1C 12.0 (A) 08/19/2023   HGBA1C 11.5 (H) 06/26/2023   HGBA1C 8.9 (H) 03/12/2023   HGBA1C 12.1 (H) 10/21/2022   HGBA1C 12.1 (H) 10/20/2022   HGBA1C 8.6 (H) 01/30/2021   HGBA1C 10.7 (H) 12/01/2020   HGBA1C 9.4 Repeated and verified X2. (H) 12/10/2019   At last visit, he was again off his insulins and we restarted: - Tresiba  38 >> off >> 20 >> 28 units daily -injected in the abdomen - Humalog  10 units 3x a day, with meals >> Lyumjev  6-8 >> 6-10 units right before the meal - burning at injection site when using the arms.  140-160: + 2 units  - 161-180: + 3 units  - 181-200: + 4 units  - 201-240: + 5 units  - 241-280: + 6 units  - 281-320: + 7 units  - 321-360: + 8 units  - >361: + 10 units  He could not tolerate metformin  2/2 diarrhea.  He checks his blood sugars more than 4 times a day with his CGM:  Previously:  Previously: - am: 90, 160-180, 209 - 2h after b'fast: n/c - before lunch: 180-190 - 2h after lunch: n/c - before dinner: 180-190 - 2h  after dinner: n/c - bedtime: 200-210 - nighttime: n/c Lowest sugar was ? (Dizziness, resolved with glucose tablets) >> 50s. Highest sugar was upper 200s >> 209 >> 300s.  Glucometer: none  Pt's meals are: - Breakfast:skips or juice - Lunch: meat occasionally + veggies - Dinner: (before 6 pm): chicken + broccoli + brown rice (1/3 cup) - Snacks:- Drinks Water , juice.  - + mild CKD, last BUN/creatinine:  Lab Results  Component Value Date   BUN 16 08/18/2023   BUN 14 07/03/2023   CREATININE 1.21 08/18/2023   CREATININE 1.34 (H) 07/03/2023   Lab Results  Component Value Date   MICRALBCREAT 138 (H) 10/29/2023   MICRALBCREAT 156 (H) 08/19/2023   MICRALBCREAT 15.9 12/17/2017   MICRALBCREAT <5.1 10/22/2016  He is not on an ACE inhibitor or ARB.  -+ HL; last set of lipids: Lab Results  Component Value Date   CHOL 100 01/20/2024   HDL 36 (L) 01/20/2024   LDLCALC 34 01/20/2024   LDLDIRECT 76.0 06/01/2019   TRIG 181 (H) 01/20/2024   CHOLHDL 2.8 01/20/2024  He is on Crestor  40 mg daily.  - last eye exam was in 10/2023 No DR reportedly. [facility].  - no numbness and tingling in his feet.  Last foot exam 10/28/2023. + L hand tingling.  Pt has FH of DM in PGF.  He has a history of HTN, aortic dilation, Crohn's disease/Ulcerative colitis - prev. On Humira , now on Skyrizi , with perianal abscesses. He had the whole large colon removed at 58 y/o [facility] He was in the hospital for 9 mo. 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 He estimates he had a total of >25 surgeries, including one for testicle removal.  1.5 years ago: 260 lbs >> changed diet >> lost weight to 190s.  ROS: + see HPI  Past Medical History:  Diagnosis Date   CAD S/P PCI to LAD and PTCA of small caliber LCx) 09/2022   Crohn's disease (HCC)    Diabetes mellitus (HCC)    Dilation of aorta (HCC)    Hypertension    Myocardial infarction (HCC) 09/2022   Non-STEMI   Ulcerative colitis (HCC)     Social  History   Socioeconomic History   Marital status: Married    Spouse name: Ricka   Number of children: 2   Years of education: Not on file   Highest education level: Not on file  Occupational History   Not on file  Tobacco Use   Smoking status: Never   Smokeless tobacco: Never  Vaping Use   Vaping status: Never Used  Substance and Sexual Activity   Alcohol use: No   Drug use: No   Sexual activity: Yes  Other Topics Concern   Not on file  Social History Narrative   Married   Social Drivers of Health   Financial Resource Strain: Not on file  Food Insecurity: No Food Insecurity (03/13/2023)   Hunger Vital Sign    Worried About Running Out of Food in the Last Year: Never true    Ran Out of Food in the Last Year: Never true  Transportation Needs: No Transportation Needs (03/13/2023)   PRAPARE - Administrator, Civil Service (Medical): No    Lack of Transportation (Non-Medical): No  Physical Activity: Not on file  Stress: Not on file  Social Connections: Unknown (10/19/2022)   Received from [facility]   Social Network    Social Network: Not on file  Intimate Partner Violence: Not At Risk (03/13/2023)   Humiliation, Afraid, Rape, and Kick questionnaire    Fear of Current or Ex-Partner: No    Emotionally Abused: No    Physically Abused: No    Sexually Abused: No   Current Outpatient Medications on File Prior to Visit  Medication Sig Dispense Refill   Accu-Chek Softclix Lancets lancets Use to check glucose once a day 100 each 12   amoxicillin -clavulanate (AUGMENTIN ) 875-125 MG tablet Take 1 tablet by mouth every 12 (twelve) hours for 7 days. 14 tablet 0   Blood Glucose Monitoring Suppl (ACCU-CHEK GUIDE) w/Device KIT Use to check glucose once a day 1 kit 0   carvedilol  (COREG ) 12.5 MG tablet Take 1 tablet (12.5 mg total) by mouth 2 (two) times daily with a meal. 180 tablet 3   Cholecalciferol  (VITAMIN D3) 20 MCG (800 UNIT) TABS Take 1 tablet by mouth daily.  (Patient not taking: Reported on 01/30/2024) 30 tablet 5   Continuous Glucose Sensor (FREESTYLE LIBRE 3 PLUS SENSOR) MISC Apply to skin every 15 days as directed. 6 each 3   glucose blood (ACCU-CHEK GUIDE TEST) test strip Use to check glucose once a day 100 each 12   insulin  degludec (TRESIBA ) 100 UNIT/ML FlexTouch Pen Inject 28 Units into the skin daily. 30  mL 3   Insulin  Lispro-aabc (LYUMJEV  KWIKPEN) 100 UNIT/ML KwikPen Inject 6-8 Units into the skin 3 (three) times daily before meals. 30 mL 3   Insulin  Pen Needle 32G X 4 MM MISC Use 1 each to inject insulin  into the skin 4 (four) times daily. 400 each 3   nitroGLYCERIN  (NITROSTAT ) 0.4 MG SL tablet Place 1 tablet (0.4 mg total) under the tongue every 5 (five) minutes as needed for chest pain. 45 tablet 3   Risankizumab -rzaa (SKYRIZI ) 360 MG/2.4ML SOCT Inject 2.4 mLs into the skin every 6 (six) weeks. 2.4 mL 4   rosuvastatin  (CRESTOR ) 40 MG tablet Take 1 tablet (40 mg total) by mouth daily. 90 tablet 3   ticagrelor  (BRILINTA ) 60 MG TABS tablet Take 1 tablet (60 mg total) by mouth 2 (two) times daily. 180 tablet 3   traMADol  (ULTRAM ) 50 MG tablet Take 1 tablet (50 mg total) by mouth every 6 (six) hours as needed. 30 tablet 0   Zinc  30 MG TABS Take 1 tablet (30 mg total) by mouth daily.     Current Facility-Administered Medications on File Prior to Visit  Medication Dose Route Frequency Provider Last Rate Last Admin   Insulin  Pen Needle (NOVOFINE) 10 each  1 packet Subcutaneous QHS Gretta Sharper L, PA-C       Allergies  Allergen Reactions   Statins Other (See Comments)    Myalgias, tolerates rosuvastatin      Family History  Problem Relation Age of Onset   Hypertension Mother    Cancer Mother    Hypertension Father    Inflammatory bowel disease Brother    Diabetes Paternal Uncle    Diabetes Paternal Grandmother    Diabetes Paternal Grandfather    Heart disease Neg Hx    Colon cancer Neg Hx    Esophageal cancer Neg Hx    Rectal cancer  Neg Hx    Stomach cancer Neg Hx    PE: BP 130/70   Pulse 95   Ht 5' 10 (1.778 m)   Wt 216 lb (98 kg)   SpO2 99%   BMI 30.99 kg/m  Wt Readings from Last 3 Encounters:  03/18/24 216 lb (98 kg)  01/30/24 212 lb 9.6 oz (96.4 kg)  01/20/24 207 lb (93.9 kg)   Constitutional: normal weight, in NAD Eyes:  EOMI, no exophthalmos ENT: no neck masses, no cervical lymphadenopathy Cardiovascular: tachycardia, RR, No MRG Respiratory: CTA B Musculoskeletal: no deformities Skin: no rash Neurological: no tremor with outstretched hands  ASSESSMENT: 1. DM2, insulin -dependent, uncontrolled, with complications: - CAD, with h/o AMI - s/p PCI 09/2022 - mild CKD  2. HL  PLAN:  1. Patient with longstanding, very uncontrolled type 2 diabetes, recent HbA1c of 12.6%, increased, at last visit.  At that time, he was off insulin  completely.  His diabetes control is significantly impacted by his ulcerative colitis and history of large colon resection.  He also has a history of pelvic abscesses with fistula formation and he was on a biologic agent in the past.  We have to avoid medications with GI side effects for him.  He could not tolerate metformin  in the past. A CGM was too expensive in the past but he was able to start this after changing insurance.  Sugars were mostly in the 300 range and up at last visit.  We discussed about the many possible complications of uncontrolled diabetes and strongly advised him to take his insulin  as recommended.  I advised him that if he  was not eating, to check blood sugars every 4 hours and correct with Lyumjev .  I gave him a sliding scale. CGM interpretation: -At today's visit, we reviewed his CGM downloads: It appears that 49% of values are in target range (goal >70%), while 50% are higher than 180 (goal <25%), and 1% are lower than 70 (goal <4%).  The calculated average blood sugar is 183.  The projected HbA1c for the next 3 months (GMI) is 7.7%. -Reviewing the CGM trends,  sugars have improved, now fluctuating around the upper limit of the target range, higher after breakfast and dinner with subsequent drops later after the meals.  Upon reviewing individual daily tracings, he frequently forgets to take his Lyumjev  before meals and is taking it after the meals, with a resulting sawtooth pattern of his blood sugars.  We discussed about the importance of taking Lyumjev  before meals, however, if he had to take it after a meal, I advised him to reduce the dose to 50%.  Says he has burning when he injects Lyumjev  in the upper arms, we discussed about moving these injections in the abdomen.  He also describes significant diarrhea especially overnight and he feels that this may be related to Tresiba .  I did advise him to move Tresiba  from injecting it in the abdomen to the upper thighs, but we discussed that this is quite unlikely to be causing diarrhea.  I advised him to get in touch with GI and discuss about this. - I suggested to:  Patient Instructions  Please continue: - Tresiba  28 units daily - move this to the upper leg/thigh - Lyumjev  6-10 units right before meals - if you have to take this after a meal, decrease the dose to 50%  Try to check sugars every 4 hours and correct with Lyumjev  if needed: - 140-160: + 2 units  - 161-180: + 3 units  - 181-200: + 4 units  - 201-240: + 5 units  - 241-280: + 6 units  - 281-320: + 7 units  - 321-360: + 8 units  - >361: + 10 units   Please return in 2 months.  - advised to check sugars at different times of the day - 4x a day, rotating check times - advised for yearly eye exams >> he is UTD - he had an elevated ACR at last visit.  Will repeat this at next OV due to his significant diarrhea for now. - return to clinic in 2 months  2. HL - Latest lipid panel from 01/2024 was reviewed: LDL at goal, improved, triglycerides elevated, and HDL at goal: Lab Results  Component Value Date   CHOL 100 01/20/2024   HDL 36 (L)  01/20/2024   LDLCALC 34 01/20/2024   LDLDIRECT 76.0 06/01/2019   TRIG 181 (H) 01/20/2024   CHOLHDL 2.8 01/20/2024  - Will continue Crestor  40 mg daily without side effects  Lela Fendt, MD PhD [facility] Endocr[facility]

## 2024-03-18 NOTE — Patient Instructions (Addendum)
 Please continue: - Tresiba  28 units daily - move this to the upper leg/thigh - Lyumjev  6-10 units right before meals - if you have to take this after a meal, decrease the dose to 50%  Try to check sugars every 4 hours and correct with Lyumjev  if needed: - 140-160: + 2 units  - 161-180: + 3 units  - 181-200: + 4 units  - 201-240: + 5 units  - 241-280: + 6 units  - 281-320: + 7 units  - 321-360: + 8 units  - >361: + 10 units   Please return in 2 months.

## 2024-03-25 ENCOUNTER — Other Ambulatory Visit (HOSPITAL_COMMUNITY): Payer: Self-pay

## 2024-03-25 ENCOUNTER — Other Ambulatory Visit: Payer: Self-pay | Admitting: Cardiology

## 2024-03-25 MED ORDER — ROSUVASTATIN CALCIUM 40 MG PO TABS
40.0000 mg | ORAL_TABLET | Freq: Every day | ORAL | 3 refills | Status: AC
  Filled 2024-03-25 (×2): qty 90, 90d supply, fill #0
  Filled 2024-07-13: qty 90, 90d supply, fill #1

## 2024-04-01 ENCOUNTER — Ambulatory Visit: Admitting: Student in an Organized Health Care Education/Training Program

## 2024-04-01 ENCOUNTER — Encounter: Payer: Self-pay | Admitting: Student in an Organized Health Care Education/Training Program

## 2024-04-01 VITALS — BP 141/84 | HR 97 | Ht 70.0 in | Wt 219.0 lb

## 2024-04-01 DIAGNOSIS — E1165 Type 2 diabetes mellitus with hyperglycemia: Secondary | ICD-10-CM

## 2024-04-01 DIAGNOSIS — I2510 Atherosclerotic heart disease of native coronary artery without angina pectoris: Secondary | ICD-10-CM | POA: Diagnosis not present

## 2024-04-01 DIAGNOSIS — E785 Hyperlipidemia, unspecified: Secondary | ICD-10-CM

## 2024-04-01 DIAGNOSIS — I10 Essential (primary) hypertension: Secondary | ICD-10-CM | POA: Diagnosis not present

## 2024-04-01 DIAGNOSIS — Z794 Long term (current) use of insulin: Secondary | ICD-10-CM | POA: Diagnosis not present

## 2024-04-01 DIAGNOSIS — E1169 Type 2 diabetes mellitus with other specified complication: Secondary | ICD-10-CM

## 2024-04-01 DIAGNOSIS — K50919 Crohn's disease, unspecified, with unspecified complications: Secondary | ICD-10-CM

## 2024-04-01 NOTE — Assessment & Plan Note (Signed)
 Hypertension presents with slightly elevated blood pressure readings, likely due to stress and white coat syndrome. Monitor blood pressure regularly and reassess at the next visit in 3 months.

## 2024-04-01 NOTE — Progress Notes (Signed)
 New Patient Office Visit  Subjective    Patient ID: ARYON NHAM, male    DOB: June 20, 1966  Age: 58 y.o. MRN: [id number]  CC:  Chief Complaint  Patient presents with   Establish Care    Diabetic eye exam-requested     HPI  Discussed the use of AI scribe software for clinical note transcription with the patient, who gave verbal consent to proceed.  History of Present Illness Sean Schaefer is a 58 year old male with diabetes, Crohn's disease, and coronary artery disease who presents for establishment of care and management of his chronic conditions.  He has been experiencing challenges in managing his Crohn's disease, with frequent flares and a recent episode of diarrhea lasting three weeks, occurring five to six times nightly. The absence of a large intestine exacerbates his symptoms, leading to significant sleep disruption as he fills three to four diapers a night. He is currently on Skyrizi , which was increased to every six weeks, but he feels it is not effective. 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 He has had Crohn's disease since age 56, initially diagnosed as ulcerative colitis, leading to the removal of his large intestine. He has undergone over twenty surgeries due to complications, including fistulas, and experiences significant impact on his daily life, including inability to work. He is currently in the process of applying for disability support.  He has had type 2 diabetes for approximately ten years, currently managed with Tresiba  (28 units daily) and Lumejev (6-8 units with meals). He cannot take metformin  due to gastrointestinal issues. He reports recent improvements in his blood sugar levels, with a decrease from 12-13% to 7.4% over the past two months.  He experienced a myocardial infarction last year with 80% blockage in the front and 100% in the back, leading to the placement  of two stents. He is on Brilinta  (60 mg), rosuvastatin , and carvedilol . He reports no chest pain.  He has a history of losing a testicle in 2000, initially thought to be epididymitis. He also reports a history of a nail injury to his eye and poor sleep, stating he has not slept well since college. He does not consume alcohol and exercises depending on his health status.    Outpatient Encounter Medications as of 04/01/2024  Medication Sig   Blood Glucose Monitoring Suppl (ACCU-CHEK GUIDE) w/Device KIT Use to check glucose once a day   carvedilol  (COREG ) 12.5 MG tablet Take 1 tablet (12.5 mg total) by mouth 2 (two) times daily with a meal.   Continuous Glucose Sensor (FREESTYLE LIBRE 3 PLUS SENSOR) MISC Apply to skin every 15 days as directed.   glucose blood (ACCU-CHEK GUIDE TEST) test strip Use to check glucose once a day   insulin  degludec (TRESIBA ) 100 UNIT/ML FlexTouch Pen Inject 28 Units into the skin daily.   Insulin  Lispro-aabc (LYUMJEV  KWIKPEN) 100 UNIT/ML KwikPen Inject 6-8 Units into the skin 3 (three) times daily before meals.   Insulin  Pen Needle 32G X 4 MM MISC Use 1 each to inject insulin  into the skin 4 (four) times daily.   Risankizumab -rzaa (SKYRIZI ) 360 MG/2.4ML SOCT Inject 2.4 mLs into the skin every 6 (six) weeks.   rosuvastatin  (CRESTOR ) 40 MG tablet Take 1 tablet (40 mg total) by mouth daily.   ticagrelor  (BRILINTA ) 60 MG TABS tablet Take 1 tablet (60 mg total) by mouth 2 (two) times daily.  Accu-Chek Softclix Lancets lancets Use to check glucose once a day   nitroGLYCERIN  (NITROSTAT ) 0.4 MG SL tablet Place 1 tablet (0.4 mg total) under the tongue every 5 (five) minutes as needed for chest pain.   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   Myocardial infarction (HCC) 09/2022   Non-STEMI   Rectal abscess 11/30/2020   Status post coronary artery stent placement 10/21/2022   Ulcerative colitis (HCC)     Past Surgical History:  Procedure Laterality Date   ANAL FISTULOTOMY N/A 07/03/2023   Procedure: SURGICAL TREATMENT OF TRANSSPHINCTERIC ANAL FISTULAS;  Surgeon: Teresa Lonni HERO, MD;  Location: [facility];  Service: General;  Laterality: N/A;   BIOPSY  12/12/2022   Procedure: BIOPSY;  Surgeon: Leigh Elspeth SQUIBB, MD;  Location: [facility];  Service: Gastroenterology;;   COLON SURGERY     COLONOSCOPY     CORONARY BALLOON ANGIOPLASTY N/A 10/20/2022   Procedure: CORONARY BALLOON ANGIOPLASTY;  Surgeon: Anner Alm ORN, MD;  Location: [facility];  Service: Cardiovascular;  Laterality: N/A;   CORONARY STENT INTERVENTION N/A 10/20/2022   Procedure: CORONARY STENT INTERVENTION;  Surgeon: Anner Alm ORN, MD;  Location: [facility] CV LAB;  Service: Cardiovascular;  Laterality: N/A;   ESOPHAGOGASTRODUODENOSCOPY (EGD) WITH PROPOFOL  N/A 12/12/2022   Procedure: ESOPHAGOGASTRODUODENOSCOPY (EGD) WITH PROPOFOL ;  Surgeon: Leigh Elspeth SQUIBB, MD;  Location: [facility];  Service: Gastroenterology;  Laterality: N/A;   EYE SURGERY Left    FLEXIBLE SIGMOIDOSCOPY N/A 02/05/2021   Procedure: FLEXIBLE POUCHOSCOPY WITH BIOPSY;  Surgeon: Teresa Lonni HERO, MD;  Location: [facility];  Service: General;  Laterality: N/A;   INCISION AND DRAINAGE ABSCESS N/A 02/05/2021   Procedure: INCISION AND DRAINAGE OF PERIANAL ABSCESS;  Surgeon: Teresa Lonni HERO, MD;  Location: [facility];  Service: General;  Laterality: N/A;   INCISION AND DRAINAGE ABSCESS N/A 12/21/2022   Procedure: INCISION AND DRAINAGE ABSCESS;  Surgeon: Signe Mitzie LABOR, MD;  Location: [facility];  Service: General;  Laterality: N/A;   INCISION AND DRAINAGE ABSCESS Left 03/13/2023   Procedure:  INCISION AND DRAINAGE ABSCESS;  Surgeon: Belinda Cough, MD;  Location: [facility];  Service: General;  Laterality: Left;   INCISION AND DRAINAGE PERIRECTAL ABSCESS N/A 11/30/2020   Procedure: IRRIGATION AND DEBRIDEMENT PERIRECTAL ABSCESS;  Surgeon: Dasie Leonor CROME, MD;  Location: [facility] OR;  Service: General;  Laterality: N/A;   INCISION AND DRAINAGE PERIRECTAL ABSCESS Left 04/03/2021   Procedure: left peri-rectal/gluteal abscess;  Surgeon: Paola Dreama SAILOR, MD;  Location: [facility];  Service: General;  Laterality: Left;   LEFT HEART CATH AND CORONARY ANGIOGRAPHY N/A 10/20/2022   Procedure: LEFT HEART CATH AND CORONARY ANGIOGRAPHY;  Surgeon: Anner Alm ORN, MD;  Location: [facility] INVASIVE CV LAB;  Service: Cardiovascular;  Laterality: N/A;   Perianal fistula repair  07/22/2010   PLACEMENT OF SETON N/A 02/05/2021   Procedure: PLACEMENT OF SETONS X2;  Surgeon: Teresa Lonni HERO, MD;  Location: [facility];  Service: General;  Laterality: N/A;   PLACEMENT OF SETON N/A 07/03/2023   Procedure: PLACEMENT OF DRAINING SETONS;  Surgeon: Teresa Lonni HERO, MD;  Location: [facility];  Service:  General;  Laterality: N/A;   RECTAL EXAM UNDER ANESTHESIA N/A 02/05/2021   Procedure: ANORECTAL EXAM UNDER ANESTHESIA;  Surgeon: Teresa Lonni HERO, MD;  Location: [facility];  Service: General;  Laterality: N/A;   RECTAL EXAM UNDER ANESTHESIA N/A 07/03/2023   Procedure: ANORECTAL EXAM UNDER ANESTHESIA;  Surgeon: Teresa Lonni HERO, MD;  Location: [facility];  Service: General;  Laterality: N/A;   TESTICLE REMOVAL Left 2000    Family History  Problem Relation Age of Onset   Hypertension Mother    Cancer Mother    Hypertension Father    Inflammatory bowel disease Brother    Diabetes Paternal Uncle    Diabetes Paternal Grandmother    Diabetes Paternal Grandfather    Heart disease Neg Hx    Colon cancer Neg Hx    Esophageal cancer Neg Hx    Rectal cancer Neg Hx    Stomach cancer Neg Hx         Objective    BP (!) 141/84    Pulse 97   Ht 5' 10 (1.778 m)   Wt 219 lb (99.3 kg)   SpO2 98%   BMI 31.42 kg/m   Physical Exam  Gen: Well-appearing man Eyes: Normal Ears: Normal tympanic membranes bilaterally Neck: Normal thyroid, no nodules or adenopathy Heart: Regular, no murmur Lungs: Unlabored, clear throughout Abd: Large well-healed scar in his mid abdomen from prior surgery, prior ostomy scar in the right lower quadrant well-healed, nontender with no organomegaly, no distention, soft, no active fistulas Ext: Warm, no edema Neuro: Alert, conversational, full strength upper and lower extremities, normal gait and balance Psych: Appropriate mood and affect, not anxious or depressed appearing.     Assessment & Plan:   Problem List Items Addressed This Visit       High   Crohn's disease (HCC) (Chronic)   Chronic Crohn's disease with frequent flares remains inadequately controlled on Skyrizi . He has a history of multiple surgeries and complications and has declined further surgery due to age and quality of life concerns. Continue Skyrizi  as prescribed. Follow up with a gastroenterologist for further management and monitor for any new or worsening symptoms.      Uncontrolled type 2 diabetes mellitus with hyperglycemia, with long-term current use of insulin  (HCC) - Primary (Chronic)   Insulin -dependent Type 2 diabetes shows improved glycemic control, though there are issues with Libre sensor adherence. He cannot use metformin  due to gastrointestinal side effects. Continue Tresiba  and Lumejev as prescribed. Follow up with an endocrinologist in October and address [place] sensor adherence issues.      Coronary artery disease involving native coronary artery of native heart without angina pectoris (Chronic)   Coronary artery disease post-myocardial infarction and stent placement is stable with no recent cardiac symptoms. The cardiologist plans to continue Brilinta  for another year. Continue Brilinta , rosuvastatin ,  and carvedilol  as prescribed.        Medium    Hyperlipidemia associated with type 2 diabetes mellitus (HCC) (Chronic)   Hyperlipidemia is effectively managed with rosuvastatin . Continue rosuvastatin  as prescribed.      Primary hypertension (Chronic)   Hypertension presents with slightly elevated blood pressure readings, likely due to stress and white coat syndrome. Monitor blood pressure regularly and reassess at the next visit in 3 months.       Return in about 3 months (around 07/01/2024).   Cleatus Debby Specking, MD

## 2024-04-01 NOTE — Assessment & Plan Note (Signed)
 Chronic Crohn's disease with frequent flares remains inadequately controlled on Skyrizi . He has a history of multiple surgeries and complications and has declined further surgery due to age and quality of life concerns. Continue Skyrizi  as prescribed. Follow up with a gastroenterologist for further management and monitor for any new or worsening symptoms.

## 2024-04-01 NOTE — Assessment & Plan Note (Signed)
 Hyperlipidemia is effectively managed with rosuvastatin . Continue rosuvastatin  as prescribed.

## 2024-04-01 NOTE — Assessment & Plan Note (Signed)
 Coronary artery disease post-myocardial infarction and stent placement is stable with no recent cardiac symptoms. The cardiologist plans to continue Brilinta  for another year. Continue Brilinta , rosuvastatin , and carvedilol  as prescribed.

## 2024-04-01 NOTE — Assessment & Plan Note (Addendum)
 Insulin -dependent Type 2 diabetes shows improved glycemic control, though there are issues with Libre sensor adherence. He cannot use metformin  due to gastrointestinal side effects. Continue Tresiba  and Lumejev as prescribed. Follow up with an endocrinologist in October and address [place] sensor adherence issues.

## 2024-04-01 NOTE — Patient Instructions (Signed)
  VISIT SUMMARY: Today, we discussed the management of your chronic conditions, including Crohn's disease, type 2 diabetes, coronary artery disease, hypertension, and hyperlipidemia. We reviewed your current medications and made plans for follow-up care with specialists.  YOUR PLAN: -CROHN'S DISEASE: Crohn's disease is a chronic inflammatory condition of the gastrointestinal tract. You will continue with Skyrizi  as prescribed and follow up with a gastroenterologist for further management. Please monitor for any new or worsening symptoms.  -TYPE 2 DIABETES MELLITUS, INSULIN  DEPENDENT: Type 2 diabetes is a condition where your body does not use insulin  properly, leading to high blood sugar levels. Your blood sugar control has improved. Continue taking Tresiba  and Lumejev as prescribed. Follow up with an endocrinologist in October and address any issues with the [facility] sensor.  -CORONARY ARTERY DISEASE, POST-STENT, ON DUAL ANTIPLATELET THERAPY: Coronary artery disease is a condition where the blood vessels supplying the heart are narrowed or blocked. Your condition is stable. Continue taking Brilinta , rosuvastatin , and carvedilol  as prescribed. Your cardiologist plans to continue Brilinta  for another year.  -HYPERTENSION: Hypertension is high blood pressure. Your blood pressure readings are slightly elevated, likely due to stress. Please monitor your blood pressure regularly and we will reassess at your next visit.  -HYPERLIPIDEMIA: Hyperlipidemia is having high levels of fats in the blood. Your condition is well-managed with rosuvastatin . Continue taking rosuvastatin  as prescribed.  -GENERAL HEALTH MAINTENANCE: You declined the flu vaccination today. We recommend getting the flu vaccine at future visits, especially since you are on immunosuppressive therapy.  INSTRUCTIONS: Please follow up with a gastroenterologist for your Crohn's disease management and an endocrinologist in October for your diabetes.  Continue monitoring your blood pressure regularly and report any new or worsening symptoms. Consider getting the flu vaccination at your next visit.

## 2024-04-14 ENCOUNTER — Other Ambulatory Visit: Payer: Self-pay

## 2024-04-15 ENCOUNTER — Other Ambulatory Visit: Payer: Self-pay

## 2024-04-19 ENCOUNTER — Other Ambulatory Visit: Payer: Self-pay

## 2024-04-19 NOTE — Progress Notes (Signed)
 Specialty Pharmacy Refill Coordination Note  Sean Schaefer is a 58 y.o. male contacted today regarding refills of specialty medication(s) Risankizumab -rzaa (Skyrizi )   Patient requested [name] at [facility] at [place] date: 04/20/24   Medication will be filled on 04/19/24.

## 2024-04-29 ENCOUNTER — Ambulatory Visit: Payer: Self-pay

## 2024-04-29 NOTE — Telephone Encounter (Signed)
Patient has appt tomorrow with you

## 2024-04-29 NOTE — Telephone Encounter (Signed)
 FYI Only or Action Required?: FYI only for provider.  Patient was last seen in primary care on 04/01/2024 by Jerrell Cleatus Ned, MD.  Called Nurse Triage reporting Hematuria.  Symptoms began yesterday.  Interventions attempted: Nothing.  Symptoms are: unchanged.  Triage Disposition: See Physician Within 24 Hours  Patient/caregiver understands and will follow disposition?: Yes Reason for Disposition  Blood in urine  (Exception: Could be normal menstrual bleeding.)  Answer Assessment - Initial Assessment Questions Takes Skyrizi  every 6 weeks, took it on Monday, states always get some sort of side effect when taking it. Last time it was diarrhea for 3 weeks.   1. COLOR of URINE: Describe the color of the urine.  (e.g., tea-colored, pink, red, bloody) Do you have blood clots in your urine? (e.g., none, pea, grape, small coin)     Droplets in urine, pink-ish   2. ONSET: When did the bleeding start?      Yesterday   3. EPISODES: How many times has there been blood in the urine? or How many times today?     3 times today  4. PAIN with URINATION: Is there any pain with passing your urine? If Yes, ask: How bad is the pain?  (Scale 1-10; or mild, moderate, severe)     Denies  5. FEVER: Do you have a fever? If Yes, ask: What is your temperature, how was it measured, and when did it start?     Denies  6. ASSOCIATED SYMPTOMS: Are you passing urine more frequently than usual?     Unsure, just can't control bladder  7. OTHER SYMPTOMS: Do you have any other symptoms? (e.g., back/flank pain, abdomen pain, vomiting)     Can't control urine, wearing a diaper because cannot control it  Protocols used: Urine - Blood In-A-AH  Copied from CRM [id number]. Topic: Clinical - Red Word Triage >> Apr 29, 2024  3:34 PM [place] J wrote: [facility] that prompted transfer to Nurse Triage: Patient cannot hold his bladder and in the urine, he saw droplets of blood.

## 2024-04-30 ENCOUNTER — Ambulatory Visit: Admitting: Student in an Organized Health Care Education/Training Program

## 2024-04-30 ENCOUNTER — Other Ambulatory Visit (HOSPITAL_COMMUNITY): Payer: Self-pay

## 2024-04-30 ENCOUNTER — Encounter: Payer: Self-pay | Admitting: Student in an Organized Health Care Education/Training Program

## 2024-04-30 VITALS — BP 102/75 | HR 125 | Wt 217.0 lb

## 2024-04-30 DIAGNOSIS — Z794 Long term (current) use of insulin: Secondary | ICD-10-CM | POA: Diagnosis not present

## 2024-04-30 DIAGNOSIS — N3000 Acute cystitis without hematuria: Secondary | ICD-10-CM | POA: Insufficient documentation

## 2024-04-30 DIAGNOSIS — E1165 Type 2 diabetes mellitus with hyperglycemia: Secondary | ICD-10-CM | POA: Diagnosis not present

## 2024-04-30 MED ORDER — NITROFURANTOIN MONOHYD MACRO 100 MG PO CAPS
100.0000 mg | ORAL_CAPSULE | Freq: Two times a day (BID) | ORAL | 0 refills | Status: AC
  Filled 2024-04-30: qty 14, 7d supply, fill #0

## 2024-04-30 MED ORDER — FREESTYLE LIBRE 3 PLUS SENSOR MISC
1.0000 | 3 refills | Status: AC
  Filled 2024-04-30: qty 6, 90d supply, fill #0
  Filled 2024-07-19: qty 6, 90d supply, fill #1

## 2024-04-30 MED ORDER — PHENAZOPYRIDINE HCL 200 MG PO TABS
200.0000 mg | ORAL_TABLET | Freq: Three times a day (TID) | ORAL | 0 refills | Status: DC | PRN
  Filled 2024-04-30: qty 6, 2d supply, fill #0

## 2024-04-30 MED ORDER — LYUMJEV KWIKPEN 100 UNIT/ML ~~LOC~~ SOPN
6.0000 [IU] | PEN_INJECTOR | Freq: Three times a day (TID) | SUBCUTANEOUS | 3 refills | Status: DC
  Filled 2024-04-30: qty 15, 63d supply, fill #0
  Filled 2024-05-10: qty 21, 87d supply, fill #0

## 2024-04-30 MED ORDER — INSULIN DEGLUDEC 100 UNIT/ML ~~LOC~~ SOPN
28.0000 [IU] | PEN_INJECTOR | Freq: Every day | SUBCUTANEOUS | 3 refills | Status: DC
  Filled 2024-04-30: qty 30, 107d supply, fill #0

## 2024-04-30 NOTE — Assessment & Plan Note (Signed)
 Symptoms seem most consistent with acute cystitis.  Infectious cystitis is the most likely explanation in this person on immunosuppression for Crohn's disease.  He is unable to give us  a urine sample today because of incontinence.  On ultrasound there is no bladder outlet obstruction, and the bladder is not distended.  So I do not think this is acute retention.  He thinks it is related to the recent dose increase of Skyrizi , but I do not see any listed side effects of cystitis or other bladder dysfunction for that medication.  I recommended empiric treatment for infectious cystitis with Macrobid 100 mg twice daily for 7 days.  Will treat the discomfort with Pyridium for 2 days.  Follow-up with me in the office in 4 days.  Hopefully he will be more comfortable by then.

## 2024-04-30 NOTE — Patient Instructions (Signed)
  VISIT SUMMARY: During your visit, we discussed your recent urinary symptoms and diarrhea. You have been experiencing these symptoms since starting Tresiba , and they have persisted for the past three days. We also reviewed your current treatment for Crohn's disease with Skyrizi , which was recently increased to the maximum dose.  YOUR PLAN: -ACUTE CYSTITIS WITH URINARY INCONTINENCE: You are experiencing acute urinary incontinence and dribbling with severe pressure and pain, likely due to bladder inflammation. This condition can cause urgency and lack of control over urination. We will start you on an antibiotic to treat the suspected bladder infection and consider Pyridium to help manage the pain. Please follow up in the clinic next week to assess your response to the treatment.  -DIARRHEA, LIKELY MEDICATION-INDUCED: Your diarrhea is suspected to be a side effect of Skyrizi , which you are taking for Crohn's disease. Severe diarrhea can also be associated with urinary incontinence. We will monitor your symptoms and adjust your treatment plan as needed.  -CROHN'S DISEASE, UNDER TREATMENT: You are currently being treated with Skyrizi  for Crohn's disease, and your dosage was recently increased to the maximum dose every six weeks. We will need to follow up with the prescribing team to ensure the efficacy of this regimen and address any concerns you have about your symptoms.  INSTRUCTIONS: Please follow up in the clinic next week to assess your response to the treatment for the suspected bladder infection. Additionally, we will need to follow up with the prescribing team for your Crohn's disease treatment to ensure the efficacy of your current regimen and address any concerns.

## 2024-04-30 NOTE — Progress Notes (Signed)
 Acute Office Visit  Subjective:     Patient ID: Sean Schaefer, male    DOB: 06-15-1966, 58 y.o.   MRN: [id number]  Chief Complaint  Patient presents with   Abdominal Pain    Not able to urinate and random painful burning pressure. Diarrhea as well for the last couple of days. Dark urine today and some blood in urine 2 days ago     HPI  Discussed the use of AI scribe software for clinical note transcription with the patient, who gave verbal consent to proceed.  History of Present Illness Sean Schaefer is a 58 year old male with Crohn's disease who presents with urinary symptoms and diarrhea.  He has been experiencing diarrhea and urinary symptoms since taking Tresiba  on Monday. The diarrhea has persisted for the past three days, accompanied by extreme pressure and difficulty urinating, where he can only dribble urine. The pressure is intense and sometimes coincides with episodes of diarrhea.  He is on Skyrizi  for Crohn's disease, which was recently increased to a maximum dose every six weeks. There has been no follow-up from the prescribing team after the increased dosage. He suspects the symptoms might be related to Skyrizi , although he has been on it for a while without previous issues.  No lower abdominal pain is present, but he describes a sensation of pressure that leads to involuntary shaking and loss of control over his legs. He has not measured his temperature but suspects he might have had a fever. He has no history of urinary infections.  He is not allergic to antibiotics and is currently experiencing significant discomfort due to the urinary symptoms, which he describes as 'crazy pain'. He has been using diapers due to the lack of control over his bladder and bowel movements.       Objective:    BP 102/75   Pulse (!) 125   Wt 217 lb (98.4 kg)   BMI 31.14 kg/m   Physical Exam  Gen: Tired and unwell appearing man Abd: Soft, nontender, nondistended, palpation over  the bladder causes urinary urgency Psych: Anxious appearing, trembling at times  POCUS: Completed due to risk of bladder outlet obstruction.  Lower abdomen the bladder is nondistended.  It is thickened.  There is a small amount of urine present inside the bladder.     Assessment & Plan:    Problem List Items Addressed This Visit       High   Uncontrolled type 2 diabetes mellitus with hyperglycemia, with long-term current use of insulin  (HCC) (Chronic)   Relevant Medications   insulin  degludec (TRESIBA ) 100 UNIT/ML FlexTouch Pen   Insulin  Lispro-aabc (LYUMJEV  KWIKPEN) 100 UNIT/ML KwikPen   Continuous Glucose Sensor (FREESTYLE LIBRE 3 PLUS SENSOR) MISC     Unprioritized   Acute cystitis - Primary   Symptoms seem most consistent with acute cystitis.  Infectious cystitis is the most likely explanation in this person on immunosuppression for Crohn's disease.  He is unable to give us  a urine sample today because of incontinence.  On ultrasound there is no bladder outlet obstruction, and the bladder is not distended.  So I do not think this is acute retention.  He thinks it is related to the recent dose increase of Skyrizi , but I do not see any listed side effects of cystitis or other bladder dysfunction for that medication.  I recommended empiric treatment for infectious cystitis with Macrobid 100 mg twice daily for 7 days.  Will treat the discomfort  with Pyridium for 2 days.  Follow-up with me in the office in 4 days.  Hopefully he will be more comfortable by then.      Relevant Medications   nitrofurantoin, macrocrystal-monohydrate, (MACROBID) 100 MG capsule   phenazopyridine (PYRIDIUM) 200 MG tablet    Meds ordered this encounter  Medications   insulin  degludec (TRESIBA ) 100 UNIT/ML FlexTouch Pen    Sig: Inject 28 Units into the skin daily.    Dispense:  30 mL    Refill:  3   Insulin  Lispro-aabc (LYUMJEV  KWIKPEN) 100 UNIT/ML KwikPen    Sig: Inject 6-8 Units into the skin 3 (three)  times daily before meals.    Dispense:  30 mL    Refill:  3   Continuous Glucose Sensor (FREESTYLE LIBRE 3 PLUS SENSOR) MISC    Sig: Apply to skin every 15 days as directed.    Dispense:  6 each    Refill:  3   nitrofurantoin, macrocrystal-monohydrate, (MACROBID) 100 MG capsule    Sig: Take 1 capsule (100 mg total) by mouth 2 (two) times daily for 7 days.    Dispense:  14 capsule    Refill:  0   phenazopyridine (PYRIDIUM) 200 MG tablet    Sig: Take 1 tablet (200 mg total) by mouth 3 (three) times daily as needed for pain.    Dispense:  6 tablet    Refill:  0    Return in about 4 days (around 05/04/2024).  Cleatus Debby Specking, MD

## 2024-05-03 ENCOUNTER — Other Ambulatory Visit (HOSPITAL_COMMUNITY): Payer: Self-pay

## 2024-05-04 ENCOUNTER — Other Ambulatory Visit: Payer: Self-pay

## 2024-05-04 ENCOUNTER — Encounter: Payer: Self-pay | Admitting: Student in an Organized Health Care Education/Training Program

## 2024-05-04 ENCOUNTER — Ambulatory Visit: Admitting: Student in an Organized Health Care Education/Training Program

## 2024-05-04 VITALS — BP 137/100 | HR 102 | Ht 70.0 in | Wt 215.6 lb

## 2024-05-04 DIAGNOSIS — E1165 Type 2 diabetes mellitus with hyperglycemia: Secondary | ICD-10-CM

## 2024-05-04 DIAGNOSIS — Z794 Long term (current) use of insulin: Secondary | ICD-10-CM

## 2024-05-04 DIAGNOSIS — N3000 Acute cystitis without hematuria: Secondary | ICD-10-CM | POA: Diagnosis not present

## 2024-05-04 NOTE — Assessment & Plan Note (Signed)
 Chronic and currently uncontrolled.  I reviewed his libre CGM data which showed blood sugars in the 300s and 400s over the last week.  He stopped using insulin  when he became ill with a lower abdominal discomfort.  We talked about the importance of restarting insulin .  I recommended keeping blood sugars under 180 in order to help his bladder recover.  He is going to restart the Tresiba  28 units daily and the insulin  lispro sliding scale with meals.

## 2024-05-04 NOTE — Progress Notes (Signed)
 Specialty Pharmacy Ongoing Clinical Assessment Note  [name] is a 58 y.o. male who is being followed by the specialty pharmacy service for RxSp Crohn's Disease   Patient's specialty medication(s) reviewed today: Risankizumab -rzaa (Skyrizi )   Missed doses in the last 4 weeks: 0   Patient/Caregiver did not have any additional questions or concerns.   Therapeutic benefit summary: Unable to assess   Adverse events/side effects summary: Experienced adverse events/side effects (Hives, increased diarrhea and trouble urinating, he is seeing PCP today)   Patient's therapy is appropriate to: Continue    Goals Addressed             This Visit's Progress    Maintain optimal adherence to therapy   On track    Patient is on track. Patient will maintain adherence         Follow up: 12 months  [facility]

## 2024-05-04 NOTE — Assessment & Plan Note (Signed)
 Diagnosed with acute cystitis on Friday based on his symptoms of exquisite suprapubic pain, severe urge incontinence, and thickening of the bladder wall on ultrasound.  Urine sample was not able to be obtained at that visit due to incontinence.  Empirically started treatment with Macrobid.  He is feeling much better today.  The discomfort in his lower abdomen is now resolved.  Still having issues with incontinence.  Some of the symptoms seem like they could be functional.  He was able to give us  a urine sample today, will be limited utility because he has been on antibiotics but we will still check it for urinalysis to look for blood and will check urine culture in case of resistance.  He is at risk for bladder infection due to uncontrolled diabetes.  Still no signs of bladder outlet obstruction, he has no urine retention today on ultrasound.  He has no low back pain, so I do not think this is a neurogenic problem.  We talked about timed voiding and supportive care.  Finish antibiotics.  I recommended getting blood sugars under control in order to reduce polyuria.  Follow-up with me as needed.  If incontinence issues become chronic, will refer to urology for urodynamics.

## 2024-05-04 NOTE — Progress Notes (Signed)
 Acute Office Visit  Subjective:     Patient ID: Sean Schaefer, male    DOB: 04-27-1966, 58 y.o.   MRN: [id number]  Chief Complaint  Patient presents with   Follow-up    4 day follow up. No change. Still having the pressure but no pain. Now having diarrhea every time he urinates.     HPI  Discussed the use of AI scribe software for clinical note transcription with the patient, who gave verbal consent to proceed.  History of Present Illness Sean Schaefer is a 58 year old male with diabetes who presents with bladder pain and urinary incontinence.  He has been experiencing ongoing bladder pain and spasms since last Friday, for which he was prescribed an antibiotic. Despite treatment, he continues to experience urinary incontinence and pressure, although the pain has somewhat improved. He describes difficulty controlling his urine, with episodes of dribbling and inability to fully empty his bladder. He attempted to provide a urine sample by drinking a lot of water , but was only able to collect a small amount of urine. No hematuria is reported.  He has a history of diabetes, with recent blood sugar levels fluctuating between 156 and over 400. He did not use insulin  over the weekend due to illness but has since resumed his medication. He notes that his blood sugar tends to rise significantly when using Skyrizi , a medication he is currently taking.  He experiences diarrhea, which he associates with the antibiotic use, noting an increase in stool frequency. No back pain, leg weakness, numbness, or tingling. He wears diapers to manage incontinence.  He has lost two pounds since Friday, attributing this to reduced food intake and increased diarrhea. He mentions a past experience with Humira  that led to temporary blindness, indicating a history of adverse reactions to medications.      Objective:    BP (!) 137/100 (BP Location: Right Arm, Cuff Size: Normal)   Pulse (!) 102   Ht 5' 10 (1.778  m)   Wt 215 lb 9.6 oz (97.8 kg)   BMI 30.94 kg/m   Physical Exam  Gen: Much more comfortable appearing man compared to Friday Abd: Soft and nontender, no tenderness over palpation of the bladder Ext: Warm, no edema Neuro: Alert, conversational, full strength in both lower extremities, no saddle anesthesia, no paresthesias, normal gait and balance  POCUS: Completed due to lower abdominal discomfort.  Bladder appears normal today.  It is not over distended.  It appears less thickened than it did on Friday.  Prostate appears normal.      Assessment & Plan:   Problem List Items Addressed This Visit       High   Uncontrolled type 2 diabetes mellitus with hyperglycemia, with long-term current use of insulin  (HCC) (Chronic)   Chronic and currently uncontrolled.  I reviewed his libre CGM data which showed blood sugars in the 300s and 400s over the last week.  He stopped using insulin  when he became ill with a lower abdominal discomfort.  We talked about the importance of restarting insulin .  I recommended keeping blood sugars under 180 in order to help his bladder recover.  He is going to restart the Tresiba  28 units daily and the insulin  lispro sliding scale with meals.        Unprioritized   Acute cystitis - Primary   Diagnosed with acute cystitis on Friday based on his symptoms of exquisite suprapubic pain, severe urge incontinence, and thickening of the bladder  wall on ultrasound.  Urine sample was not able to be obtained at that visit due to incontinence.  Empirically started treatment with Macrobid.  He is feeling much better today.  The discomfort in his lower abdomen is now resolved.  Still having issues with incontinence.  Some of the symptoms seem like they could be functional.  He was able to give us  a urine sample today, will be limited utility because he has been on antibiotics but we will still check it for urinalysis to look for blood and will check urine culture in case of  resistance.  He is at risk for bladder infection due to uncontrolled diabetes.  Still no signs of bladder outlet obstruction, he has no urine retention today on ultrasound.  He has no low back pain, so I do not think this is a neurogenic problem.  We talked about timed voiding and supportive care.  Finish antibiotics.  I recommended getting blood sugars under control in order to reduce polyuria.  Follow-up with me as needed.  If incontinence issues become chronic, will refer to urology for urodynamics.      Relevant Orders   Urinalysis, Routine w reflex microscopic   Urine Culture   His follow-up already set with me in December.  Cleatus Debby Specking, MD

## 2024-05-04 NOTE — Patient Instructions (Addendum)
   VISIT SUMMARY: During your visit, we discussed your ongoing bladder pain, urinary incontinence, and high blood sugar levels. We also addressed your recent experience with diarrhea, likely due to antibiotic use. We reviewed your current medications and made adjustments to help manage your symptoms and improve your overall health.  YOUR PLAN: -URINARY INCONTINENCE AND BLADDER DYSFUNCTION AFTER ACUTE CYSTITIS: This condition involves difficulty controlling your bladder following a bladder infection. You should continue taking Macrobid twice daily to complete the course. Implement timed voiding every two hours and monitor your blood sugar levels, keeping them under 180 mg/dL. We will send a urine sample for analysis and consider a referral to a urologist if your symptoms persist.  -TYPE 2 DIABETES MELLITUS WITH HYPERGLYCEMIA, ON INSULIN : This means your blood sugar levels are too high, which can cause complications. It is important to resume your insulin  therapy as per the sliding scale to keep your blood sugar levels under 180 mg/dL. Use a continuous glucose sensor to monitor your levels. Maintaining good blood sugar control can help prevent urinary complications.  -DIARRHEA SECONDARY TO ANTIBIOTIC USE: Your diarrhea is likely caused by the antibiotic and should resolve after you finish the course. Continue taking Macrobid as prescribed and monitor for resolution after completing the antibiotic. Diarrhea is a common side effect of antibiotics and should improve once the medication is finished.  INSTRUCTIONS: Please follow up with a urine sample for analysis. If your bladder symptoms persist, we may refer you to a urologist. Continue to monitor your blood sugar levels closely and resume your insulin  therapy as directed. If your diarrhea does not resolve after completing the antibiotic course, please let us  know.

## 2024-05-05 ENCOUNTER — Telehealth: Payer: Self-pay | Admitting: Gastroenterology

## 2024-05-05 LAB — URINALYSIS, ROUTINE W REFLEX MICROSCOPIC
Bilirubin Urine: NEGATIVE
Leukocytes,Ua: NEGATIVE
Nitrite: POSITIVE — AB
Specific Gravity, Urine: 1.02 (ref 1.000–1.030)
Total Protein, Urine: 100 — AB
Urine Glucose: 250 — AB
Urobilinogen, UA: 0.2 (ref 0.0–1.0)
pH: 5.5 (ref 5.0–8.0)

## 2024-05-05 LAB — URINE CULTURE
MICRO NUMBER:: 17097152
Result:: NO GROWTH
SPECIMEN QUALITY:: ADEQUATE

## 2024-05-05 NOTE — Telephone Encounter (Signed)
 Inbound call from patients wife stating husband has been experiencing some side effects from medication skyrizi . Wife stating he's been having diarrhea that last for 2-3 weeks and is now starting to have rashes and not sure if its because of other medication plus skyrizi  but they would like to speak to nurse  Please advise  Thank you

## 2024-05-06 ENCOUNTER — Ambulatory Visit: Payer: Self-pay | Admitting: Student in an Organized Health Care Education/Training Program

## 2024-05-06 NOTE — Telephone Encounter (Signed)
 Called the patient. No answer. Left message asking for a return call to discuss.

## 2024-05-06 NOTE — Telephone Encounter (Signed)
 Spoke with the patient. He agrees to the appointment.

## 2024-05-06 NOTE — Telephone Encounter (Signed)
 Called patient. No answer. Left message to call back and ask to speak with a nurse.

## 2024-05-06 NOTE — Telephone Encounter (Signed)
 Thanks for the note, and his frustration is duly noted.  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  They last saw him in July and have continued to echo my same sentiments that he needs to strongly consider a diverting ileostomy for long-term control of this condition, something which he has been opposed to.  I was planning to follow him up after seeing where things may go with the surgeon and also while he was seeing endocrinology for hopeful better control of his diabetes. My apologies that we have lost track of him since then, and he certainly could have called us  in the interim.  I am out of the office the next couple of weeks on hospital service, and if you would like to be seen at my next available, then we can add on a 1020 slot on November 4.  HD

## 2024-05-06 NOTE — Telephone Encounter (Signed)
 Spoke with the patient. He verbalized anger, in fact mad as hell. He said since being put on his biologic, he has not received any follow up phone calls.No one gives a damn. He feels the quality of his care has gone way down. Patient says he has been sick with rashes and he could not even urinate since starting the medication. Patient reports diarrhea as well. He has not called us , but feels we should have been calling him to check on him. He wants to speak with his provider and expresses again his anger with [facility].

## 2024-05-10 ENCOUNTER — Other Ambulatory Visit (HOSPITAL_COMMUNITY): Payer: Self-pay

## 2024-05-10 DIAGNOSIS — K5000 Crohn's disease of small intestine without complications: Secondary | ICD-10-CM | POA: Diagnosis not present

## 2024-05-11 ENCOUNTER — Other Ambulatory Visit: Payer: Self-pay

## 2024-05-11 ENCOUNTER — Other Ambulatory Visit (HOSPITAL_COMMUNITY): Payer: Self-pay

## 2024-05-19 ENCOUNTER — Encounter: Payer: Self-pay | Admitting: Internal Medicine

## 2024-05-19 ENCOUNTER — Ambulatory Visit: Admitting: Internal Medicine

## 2024-05-19 VITALS — BP 122/80 | HR 123 | Ht 70.0 in | Wt 213.0 lb

## 2024-05-19 DIAGNOSIS — E1159 Type 2 diabetes mellitus with other circulatory complications: Secondary | ICD-10-CM

## 2024-05-19 DIAGNOSIS — Z794 Long term (current) use of insulin: Secondary | ICD-10-CM | POA: Diagnosis not present

## 2024-05-19 DIAGNOSIS — E1165 Type 2 diabetes mellitus with hyperglycemia: Secondary | ICD-10-CM | POA: Diagnosis not present

## 2024-05-19 DIAGNOSIS — E7849 Other hyperlipidemia: Secondary | ICD-10-CM | POA: Diagnosis not present

## 2024-05-19 MED ORDER — INSULIN DEGLUDEC 100 UNIT/ML ~~LOC~~ SOPN: 34.0000 [IU] | PEN_INJECTOR | Freq: Every day | SUBCUTANEOUS | Status: DC

## 2024-05-19 MED ORDER — LYUMJEV KWIKPEN 100 UNIT/ML ~~LOC~~ SOPN: 10.0000 [IU] | PEN_INJECTOR | Freq: Three times a day (TID) | SUBCUTANEOUS | Status: AC

## 2024-05-19 NOTE — Progress Notes (Signed)
 Patient ID: Sean Schaefer, male   DOB: 1965/10/03, 58 y.o.   MRN: [id number]  HPI: Sean Schaefer is a 58 y.o.-year-old male, initially referred by Dr. Lonni Pizza (colorectal surgery), for management of DM2, dx in 2015-16, insulin -dependent few years later, uncontrolled, with complications (CAD, h/o AMI; mild CKD; DR).  Last visit 3 months ago. He saw Dr. Von.  Interim hx: He continues to have hives, diarrhea, urinary hesitancy, and hyperglycemia from Skyrizi . He has not seen GI recently but has an appointment coming up. He will switch GI Dr's. He was on cortisone cream, but no p.o. or parenteral steroids.   Reviewed HbA1c: Lab Results  Component Value Date   HGBA1C 12.6 (A) 01/30/2024   HGBA1C 10.4 (A) 10/28/2023   HGBA1C 12.0 (A) 08/19/2023   HGBA1C 11.5 (H) 06/26/2023   HGBA1C 8.9 (H) 03/12/2023   HGBA1C 12.1 (H) 10/21/2022   HGBA1C 12.1 (H) 10/20/2022   HGBA1C 8.6 (H) 01/30/2021   HGBA1C 10.7 (H) 12/01/2020   HGBA1C 9.4 Repeated and verified X2. (H) 12/10/2019   He is on: - Tresiba  38 >> off >> 20 >> 28 units daily -injected in the abdomen >> thighs - Humalog  10 units 3x a day, with meals >> Lyumjev  6-8 >> 6-10 units right before the meal - burning at injection site when using the arms. He had nausea and shaking from Lyumjev , but he feels that this was related to taking too much Lyumjev  when the sugars are very high approximately a week ago.  140-160: + 2 units  - 161-180: + 3 units  - 181-200: + 4 units  - 201-240: + 5 units  - 241-280: + 6 units  - 281-320: + 7 units  - 321-360: + 8 units  - >361: + 10 units  He could not tolerate metformin  2/2 diarrhea.  He checks his blood sugars more than 4 times a day with his CGM:  Prev.:  Previously:  Previously: - am: 90, 160-180, 209 - 2h after b'fast: n/c - before lunch: 180-190 - 2h after lunch: n/c - before dinner: 180-190 - 2h after dinner: n/c - bedtime: 200-210 - nighttime: n/c Lowest sugar was ?  (Dizziness, resolved with glucose tablets) >> 50s. Highest sugar was upper 200s >> 209 >> 300s >> 300s.  Glucometer: none  Pt's meals are: - Breakfast:skips or juice - Lunch: meat occasionally + veggies - Dinner: (before 6 pm): chicken + broccoli + brown rice (1/3 cup) - Snacks:- Drinks Water , juice.  - + mild CKD, last BUN/creatinine:  Lab Results  Component Value Date   BUN 16 08/18/2023   BUN 14 07/03/2023   CREATININE 1.21 08/18/2023   CREATININE 1.34 (H) 07/03/2023   Lab Results  Component Value Date   MICRALBCREAT 138 (H) 10/29/2023   MICRALBCREAT 156 (H) 08/19/2023   MICRALBCREAT 15.9 12/17/2017   MICRALBCREAT <5.1 10/22/2016  He is not on an ACE inhibitor or ARB.  -+ HL; last set of lipids: Lab Results  Component Value Date   CHOL 100 01/20/2024   HDL 36 (L) 01/20/2024   LDLCALC 34 01/20/2024   LDLDIRECT 76.0 06/01/2019   TRIG 181 (H) 01/20/2024   CHOLHDL 2.8 01/20/2024  He is on Crestor  40 mg daily.  - last eye exam was in 10/2023: + DR: [facility].  - no numbness and tingling in his feet.  Last foot exam 10/28/2023. + L hand tingling.  Pt has FH of DM in PGF.  He has a history of  HTN, aortic dilation, Crohn's disease/Ulcerative colitis - prev. On Humira , now on Skyrizi , with perianal abscesses. He had the whole large colon removed at 58 y/o [facility]). He was in the hospital for 9 mo. 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 He estimates he had a total of >25 surgeries, including one for testicle removal.  1.5 years ago: 260 lbs >> changed diet >> lost weight to 190s.  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   Myocardial infarction (HCC) 09/2022   Non-STEMI   Rectal abscess 11/30/2020   Status post coronary artery stent placement  10/21/2022   Ulcerative colitis (HCC)     Social History   Socioeconomic History   Marital status: Married    Spouse name: Ricka   Number of children: 2   Years of education: Not on file   Highest education level: Not on file  Occupational History   Not on file  Tobacco Use   Smoking status: Never   Smokeless tobacco: Never  Vaping Use   Vaping status: Never Used  Substance and Sexual Activity   Alcohol use: No   Drug use: No   Sexual activity: Yes  Other Topics Concern   Not on file  Social History Narrative   Married   Social Drivers of Health   Financial Resource Strain: Not on file  Food Insecurity: No Food Insecurity (03/13/2023)   Hunger Vital Sign    Worried About Running Out of Food in the Last Year: Never true    Ran Out of Food in the Last Year: Never true  Transportation Needs: No Transportation Needs (03/13/2023)   PRAPARE - Administrator, Civil Service (Medical): No    Lack of Transportation (Non-Medical): No  Physical Activity: Not on file  Stress: Not on file  Social Connections: Unknown (10/19/2022)   Received from [facility]   Social Network    Social Network: Not on file  Intimate Partner Violence: Not At Risk (03/13/2023)   Humiliation, Afraid, Rape, and Kick questionnaire    Fear of Current or Ex-Partner: No    Emotionally Abused: No    Physically Abused: No    Sexually Abused: No   Current Outpatient Medications on File Prior to Visit  Medication Sig Dispense Refill   Accu-Chek Softclix Lancets lancets Use to check glucose once a day 100 each 12   Blood Glucose Monitoring Suppl (ACCU-CHEK GUIDE) w/Device KIT Use to check glucose once a day 1 kit 0   carvedilol  (COREG ) 12.5 MG tablet Take 1 tablet (12.5 mg total) by mouth 2 (two) times daily with a meal. 180 tablet 3   Continuous Glucose Sensor (FREESTYLE LIBRE 3 PLUS SENSOR) MISC Apply to skin every 15 days as directed. 6 each 3   glucose blood (ACCU-CHEK GUIDE TEST) test strip  Use to check glucose once a day 100 each 12   insulin  degludec (TRESIBA ) 100 UNIT/ML FlexTouch Pen Inject 28 Units into the skin daily. 30 mL 3   Insulin  Lispro-aabc (LYUMJEV  KWIKPEN) 100 UNIT/ML KwikPen Inject 6 - 8 Units into the skin 3 (three) times daily before meals. 30 mL 3   Insulin  Pen Needle 32G X 4 MM MISC Use  1 each to inject insulin  into the skin 4 (four) times daily. 400 each 3   nitroGLYCERIN  (NITROSTAT ) 0.4 MG SL tablet Place 1 tablet (0.4 mg total) under the tongue every 5 (five) minutes as needed for chest pain. 45 tablet 3   Risankizumab -rzaa (SKYRIZI ) 360 MG/2.4ML SOCT Inject 2.4 mLs into the skin every 6 (six) weeks. 2.4 mL 4   rosuvastatin  (CRESTOR ) 40 MG tablet Take 1 tablet (40 mg total) by mouth daily. 90 tablet 3   ticagrelor  (BRILINTA ) 60 MG TABS tablet Take 1 tablet (60 mg total) by mouth 2 (two) times daily. 180 tablet 3   Current Facility-Administered Medications on File Prior to Visit  Medication Dose Route Frequency Provider Last Rate Last Admin   Insulin  Pen Needle (NOVOFINE) 10 each  1 packet Subcutaneous QHS Gretta Ozell CROME, PA-C       Allergies  Allergen Reactions   Statins Other (See Comments)    Myalgias, tolerates rosuvastatin      Family History  Problem Relation Age of Onset   Hypertension Mother    Cancer Mother    Hypertension Father    Inflammatory bowel disease Brother    Diabetes Paternal Uncle    Diabetes Paternal Grandmother    Diabetes Paternal Grandfather    Heart disease Neg Hx    Colon cancer Neg Hx    Esophageal cancer Neg Hx    Rectal cancer Neg Hx    Stomach cancer Neg Hx    PE: BP 122/80   Pulse (!) 123   Ht 5' 10 (1.778 m)   Wt 213 lb (96.6 kg)   SpO2 98%   BMI 30.56 kg/m  Wt Readings from Last 15 Encounters:  05/19/24 213 lb (96.6 kg)  05/04/24 215 lb 9.6 oz (97.8 kg)  04/30/24 217 lb (98.4 kg)  04/01/24 219 lb (99.3 kg)  03/18/24 216 lb (98 kg)  01/30/24 212 lb 9.6 oz (96.4 kg)  01/20/24 207 lb (93.9 kg)   10/28/23 211 lb (95.7 kg)  10/23/23 209 lb (94.8 kg)  08/19/23 198 lb 9.6 oz (90.1 kg)  08/05/23 191 lb 2 oz (86.7 kg)  06/26/23 191 lb (86.6 kg)  05/28/23 191 lb 12.8 oz (87 kg)  03/17/23 205 lb 3.2 oz (93.1 kg)  03/12/23 207 lb (93.9 kg)   Constitutional: normal weight, in NAD Eyes:  EOMI, no exophthalmos ENT: no neck masses, no cervical lymphadenopathy Cardiovascular: tachycardia, RR, No MRG Respiratory: CTA B Musculoskeletal: no deformities Skin: no rash Neurological: no tremor with outstretched hands  ASSESSMENT: 1. DM2, insulin -dependent, uncontrolled, with complications: - CAD, with h/o AMI - s/p PCI 09/2022 - mild CKD - DR  2. HL  PLAN:  1. Patient with longstanding, uncontrolled type 2 diabetes, with an HbA1c of 12.6% before last visit, and with significant hyperglycemia after he started Skyrizi .  He also overall feels very poorly on this medication.  At last visit she described diarrhea from Tresiba .  I advised him to move the injections to the thighs.  He does not feel that this is causing him any problems right now.  He did have an episode of nausea and tremors from Lyumjev  but he felt that this was related to correcting high blood sugars with too much Lyumjev . CGM interpretation: -At today's visit, we reviewed his CGM downloads: It appears that 10% of values are in target range (goal >70%), while 90% are higher than 180 (goal <25%), and 0% are lower than 70 (goal <4%).  The calculated average  blood sugar is 256.  The projected HbA1c for the next 3 months (GMI) is 9.4%. -Reviewing the CGM trends, sugars appear to be fluctuating above the upper limit of the target range, improved in the last 10 days, along with feeling slightly better after a flareup of his symptoms due to the Skyrizi  dose.  However, the sugars are still elevated so we discussed about increasing the dose of Tresiba  and also Lyumjev .  He should continue to vary the dose of Lyumjev  based on the size of his  meals. - I suggested to:  Patient Instructions  Please increase: - Tresiba  34 units daily - Lyumjev  10-14 units right before meals If you have to take this after a meal, decrease the dose to 50%.  Try to use correction Lyumjev  if needed: - 140-160: + 2 units  - 161-180: + 3 units  - 181-200: + 4 units  - 201-240: + 5 units  - 241-280: + 6 units  - 281-320: + 7 units  - 321-360: + 8 units  - >361: + 10 units   Please return in 2 months.  - we checked his HbA1c: 8.4% (lower) - advised to check sugars at different times of the day - 4x a day, rotating check times - advised for yearly eye exams >> he is UTD - he continues to have diarrhea and also has problems urinating.  He mentions he is not able to provide a sample for the urine ACR today. - return to clinic in 2 months  2. HL - Latest lipid panel from 01/2024 was reviewed: LDL at goal, improved, triglycerides elevated, and HDL at goal: Lab Results  Component Value Date   CHOL 100 01/20/2024   HDL 36 (L) 01/20/2024   LDLCALC 34 01/20/2024   LDLDIRECT 76.0 06/01/2019   TRIG 181 (H) 01/20/2024   CHOLHDL 2.8 01/20/2024  - Will continue Crestor  40 mg daily without side effects  Lela Fendt, MD PhD [facility]

## 2024-05-19 NOTE — Patient Instructions (Addendum)
 Please increase: - Tresiba  34 units daily - Lyumjev  10-14 units right before meals If you have to take this after a meal, decrease the dose to 50%.  Try to use correction Lyumjev  if needed: - 140-160: + 2 units  - 161-180: + 3 units  - 181-200: + 4 units  - 201-240: + 5 units  - 241-280: + 6 units  - 281-320: + 7 units  - 321-360: + 8 units  - >361: + 10 units   Please return in 2 months.

## 2024-05-25 ENCOUNTER — Encounter: Payer: Self-pay | Admitting: Gastroenterology

## 2024-05-25 ENCOUNTER — Ambulatory Visit: Admitting: Gastroenterology

## 2024-05-25 VITALS — BP 99/60 | HR 87 | Ht 70.0 in | Wt 217.8 lb

## 2024-05-25 DIAGNOSIS — K6289 Other specified diseases of anus and rectum: Secondary | ICD-10-CM | POA: Diagnosis not present

## 2024-05-25 DIAGNOSIS — K50813 Crohn's disease of both small and large intestine with fistula: Secondary | ICD-10-CM

## 2024-05-25 DIAGNOSIS — Z7960 Long term (current) use of unspecified immunomodulators and immunosuppressants: Secondary | ICD-10-CM | POA: Diagnosis not present

## 2024-05-25 DIAGNOSIS — K50114 Crohn's disease of large intestine with abscess: Secondary | ICD-10-CM

## 2024-05-25 DIAGNOSIS — R197 Diarrhea, unspecified: Secondary | ICD-10-CM

## 2024-05-25 NOTE — Patient Instructions (Signed)
 Thank you for trusting me with your gastrointestinal care!    Dr. Victory Legrand [facility]

## 2024-05-25 NOTE — Progress Notes (Signed)
 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 Steroids, 5-ASA) [id number] - 3 stage TAPC + IPAA (TAC + IE, Dr. Queen in [place]; IPAA, Dr. Dwaine at [facility]) Reports doing well throughout the 1990s with satisfactory pouch function Late 1990's/early 2000's - evolution of perianal fistulous and recurrent perianal abscesses - no IBD tx 2012 - EUA for perirectal abscesses; at least 3 fistula tracts curetted and fulgurated 2015 - Saw Dr. Kristie and tx'd w/ Humira  x 8 months without benefit (dosing/levels unknown), reports feeling poorly 2022 - Established care with [facility]            EUA x 3 (11/2020, 01/2021, 03/2021) -multiple transfer enteric fistulous status post seton placement; fistulous disease distal to anastomosis            Initiated IFX 5 mg/kg  Q4 weeks--> attempted titration to 10 mg/kg which resulted in visual changes and decreased back to 5 mg/kg 2023 - Combination therapyw/ IFX 5 mg/kg + AZA; TDM IFX 1.7, Ab 0 2024 - Induction Skyrizi  01/2023            Developed complex abscess involving left hip during hospitalization 02/2023            EUA x 3 (12/2022, 02/2023, 06/2023) - I&Ds with seton placement x 2   Saw my colleague Dr. Inocente Hausen in consultation 08/05/2023 after his most recent incision and drainage and seton placement with subsequent antibiotics. Dr. Hausen recommended increasing the Skyrizi  maintenance dose from 180 mg to 360 mg, and some additional lab work was performed. She agreed with Dr. Teresa that a diverting ileostomy may be beneficial for him, though Josip has been reluctant to do so in  multiple conversations with his surgeon. She also suggested consideration of chronic suppressive antibiotics such as ciprofloxacin  or Flagyl .  In April 2025 office visit with me, no clear improvement in his condition from Skyrizi  360 mg every 8 weeks. We were able to get authorization to increase the medication frequency to every 6 weeks, which began in early May 2025.  Still severe active fistulizing disease with setons and drain at surgical clinic follow-up July 31 and on October 20.  Surgery with permanent end ileostomy was again advised.   Discussed the use of AI scribe software for clinical note transcription with the patient, who gave verbal consent to proceed.  History of Present Illness Sean Schaefer is a 58 year old male with fistulizing Crohn's disease who presents with concerns about medication side effects and lack of follow-up care.  He has been experiencing significant issues with his current treatment regimen for fistulizing Crohn's disease. After being prescribed the increased dose of Skyrizi , he experienced severe side effects including major diarrhea for three weeks, hives, and an inability to urinate. These symptoms persisted through three rounds of the medication, with the third round exacerbating his urinary issues. During that struggle in the last few months he mentions using 94 diapers in one week due to diarrhea and having to retrain himself to urinate, indicating a significant impact on his daily life.  He has a history of heart issues, and is regularly seen by a cardiologist but still  on heart medication. He expresses concern about the potential risks of new medications, given his cardiac history.  He feels that his care has deteriorated since being seen by another doctor, whom he does not remember well, and expresses a sense of being 'thrown to the wolves' without adequate support or follow-up.  He has a long history of Crohn's disease, spanning over thirty years, and  is feeling exhausted by the ongoing management of his condition. He is uncertain about future treatment options and wants to avoid surgical interventions like an ileostomy, which he has experienced in the past and does not wish to repeat.   In addition to follow-up notes with general surgery, I have also reviewed notes from his cardiology and endocrinology consultants.  His diabetes remains under poor control with most recent hemoglobin A1c of 12.6.   Alverto was here with his wife today, and he was quite upset and generally dissatisfied with his current condition and what he perceives to be a lack of attention and care from me since I last saw him. He feels strongly that the increase Skyrizi  dose led to an increase in diarrhea and urinary difficulties as described above.  He recalls seeing Dr. Suzann for the consultation that he had with her prior to the April visit with me, but he says that ever since that time he feels like something has changed with the care he has received.  He felt he should have gotten follow-up care with her in addition to me.  He also felt that she should even have been in the exam room during his visit with me today.  I explained that I had requested the consultation that she gave (which was quite comprehensive and helpful), and that she return him to my care ,which she did.  I then told him that she and I had spoken about him on several occasions since then entheses most recently yesterday) as I have gotten updates from our surgical consultants. [name] was adamant that he had no plan for follow-up and that I should have been checking in with him after the increase of Skyrizi  dose so that I would have known about what was going on with him.  He then asked me how would you have felt if I had died from this and you found out about it later?SABRA  After letting him convey all of these thoughts to me, I validated his concerns and tried to respond to them.  He stood up on a few  occasions and meant to leave the office, I then kindly asked him to hear me out and he did so though often with poor eye contact and a frustrated disposition. I explained in detail how after the April visit, I knew he had follow-up with surgery, which he unquestionably needed, and that it would take a few months to see if the increased dose of Skyrizi  would lead to improvement.  It was my intention to see him back within a few months afterward, though our July schedule had not yet been completed at the time of his early April visit with me.  I put such patients in for a recall in the epic system, though it would seem that that did not work as expected, and for that I apologized.  A misstep, nothing more.  Nevertheless, we also did not hear from him or his wife throughout any of that difficulty that he was having until he called our office upset on 05/05/2024 (note on file). This did not  seem to satisfy him.  After confirming that he wanted my further advice on his treatment, I reiterated that I agree with both Dr. Suzann and Dr. Teresa that he should consider a diverting ileostomy.  He was animated about that and said he would not entertain that idea because it was so difficult to have an ileostomy for many years and that I would not understand it if I had not experienced that. Feelings were again validated, and I went on to give the clinical rationale for that.  Regarding medicines, Skyrizi  at now an accelerated frequency is not working to control his disease.  (We did not even get as far as an exam today since he ended the visit after I finished giving him my thoughts on this).  He wanted to know about other medications that were available, so I reiterated the availability of a Jak inhibitor such as Rinvoq, but that it would not be advised as monotherapy due to poor data to support that with his degree of fistulizing disease having failed anti-TNF and anti-IL 23 as he has, in conjunction with the  increased risk of clots in a patient who had had an MI within the last 2 years. There might potentially be a role to add on the Rinvoq to the Skyrizi  if there had been some response to the latter (as Dr. Suzann had also noted), but I have no experience with that and feel that it would be best discussed and managed with an IBD expert.  I reiterated that Dr. Suzann is such an individual, but he did not wish to have further care with her.  But that is moot since he has decided to stop his Skyrizi .  I offered him the possibility of seeing an IBD specialist at [facility] (the nearest academic center with whom I have some contacts and might help to expedite that), and that was the original plan before they had determined at 1 point it would not be feasible for insurance reasons.  That was why we then waited for Dr. Suzann to join our practice.    ROS: Cardiovascular:  no chest pain Respiratory: no dyspnea  The patient's Past Medical, Family and Social History were reviewed and are on file in the EMR. 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   Myocardial infarction (HCC) 09/2022   Non-STEMI   Rectal abscess 11/30/2020   Status post coronary artery stent placement 10/21/2022   Ulcerative colitis (HCC)     Past Surgical History:  Procedure Laterality Date   ANAL FISTULOTOMY N/A 07/03/2023   Procedure: SURGICAL TREATMENT OF TRANSSPHINCTERIC ANAL FISTULAS;  Surgeon: Teresa Lonni HERO, MD;  Location: [facility];  Service: General;  Laterality: N/A;   BIOPSY  12/12/2022   Procedure: BIOPSY;  Surgeon: Leigh Elspeth SQUIBB, MD;  Location: [facility];  Service: Gastroenterology;;   COLON SURGERY     COLONOSCOPY     CORONARY BALLOON ANGIOPLASTY N/A 10/20/2022   Procedure: CORONARY BALLOON ANGIOPLASTY;  Surgeon:  Anner Alm ORN, MD;  Location: [facility];  Service: Cardiovascular;  Laterality: N/A;   CORONARY STENT INTERVENTION N/A 10/20/2022   Procedure: CORONARY STENT INTERVENTION;  Surgeon: Anner Alm ORN, MD;  Location: [facility] INVASIVE CV LAB;  Service: Cardiovascular;  Laterality:  N/A;   ESOPHAGOGASTRODUODENOSCOPY (EGD) WITH PROPOFOL  N/A 12/12/2022   Procedure: ESOPHAGOGASTRODUODENOSCOPY (EGD) WITH PROPOFOL ;  Surgeon: Leigh Elspeth SQUIBB, MD;  Location: [facility] ENDOSCOPY;  Service: Gastroenterology;  Laterality: N/A;   EYE SURGERY Left    FLEXIBLE SIGMOIDOSCOPY N/A 02/05/2021   Procedure: FLEXIBLE POUCHOSCOPY WITH BIOPSY;  Surgeon: Teresa Lonni HERO, MD;  Location: [facility];  Service: General;  Laterality: N/A;   INCISION AND DRAINAGE ABSCESS N/A 02/05/2021   Procedure: INCISION AND DRAINAGE OF PERIANAL ABSCESS;  Surgeon: Teresa Lonni HERO, MD;  Location: [facility];  Service: General;  Laterality: N/A;   INCISION AND DRAINAGE ABSCESS N/A 12/21/2022   Procedure: INCISION AND DRAINAGE ABSCESS;  Surgeon: Signe Mitzie LABOR, MD;  Location: [facility];  Service: General;  Laterality: N/A;   INCISION AND DRAINAGE ABSCESS Left 03/13/2023   Procedure: INCISION AND DRAINAGE ABSCESS;  Surgeon: Belinda Cough, MD;  Location: [facility];  Service: General;  Laterality: Left;   INCISION AND DRAINAGE PERIRECTAL ABSCESS N/A 11/30/2020   Procedure: IRRIGATION AND DEBRIDEMENT PERIRECTAL ABSCESS;  Surgeon: Dasie Leonor CROME, MD;  Location: [facility] OR;  Service: General;  Laterality: N/A;   INCISION AND DRAINAGE PERIRECTAL ABSCESS Left 04/03/2021   Procedure: left peri-rectal/gluteal abscess;  Surgeon: Paola Dreama SAILOR, MD;  Location: [facility];  Service: General;  Laterality: Left;   LEFT HEART CATH AND CORONARY ANGIOGRAPHY N/A 10/20/2022   Procedure: LEFT HEART CATH AND CORONARY ANGIOGRAPHY;  Surgeon: Anner Alm ORN, MD;  Location: [facility] INVASIVE CV LAB;  Service: Cardiovascular;  Laterality: N/A;   Perianal fistula repair  07/22/2010    PLACEMENT OF SETON N/A 02/05/2021   Procedure: PLACEMENT OF SETONS X2;  Surgeon: Teresa Lonni HERO, MD;  Location: [facility];  Service: General;  Laterality: N/A;   PLACEMENT OF SETON N/A 07/03/2023   Procedure: PLACEMENT OF DRAINING SETONS;  Surgeon: Teresa Lonni HERO, MD;  Location: [facility];  Service: General;  Laterality: N/A;   RECTAL EXAM UNDER ANESTHESIA N/A 02/05/2021   Procedure: ANORECTAL EXAM UNDER ANESTHESIA;  Surgeon: Teresa Lonni HERO, MD;  Location: [facility];  Service: General;  Laterality: N/A;   RECTAL EXAM UNDER ANESTHESIA N/A 07/03/2023   Procedure: ANORECTAL EXAM UNDER ANESTHESIA;  Surgeon: Teresa Lonni HERO, MD;  Location: [facility];  Service: General;  Laterality: N/A;   TESTICLE REMOVAL Left 2000     Objective:  Med list reviewed  Current Outpatient Medications:    Accu-Chek Softclix Lancets lancets, Use to check glucose once a day, Disp: 100 each, Rfl: 12   Blood Glucose Monitoring Suppl (ACCU-CHEK GUIDE) w/Device KIT, Use to check glucose once a day, Disp: 1 kit, Rfl: 0   carvedilol  (COREG ) 12.5 MG tablet, Take 1 tablet (12.5 mg total) by mouth 2 (two) times daily with a meal., Disp: 180 tablet, Rfl: 3   Continuous Glucose Sensor (FREESTYLE LIBRE 3 PLUS SENSOR) MISC, Apply to skin every 15 days as directed., Disp: 6 each, Rfl: 3   glucose blood (ACCU-CHEK GUIDE TEST) test strip, Use to check glucose once a day, Disp: 100 each, Rfl: 12   insulin  degludec (TRESIBA ) 100 UNIT/ML FlexTouch Pen, Inject 34 Units into the skin daily., Disp: , Rfl:    Insulin  Lispro-aabc (LYUMJEV  KWIKPEN) 100 UNIT/ML KwikPen, Inject 10-14 Units into the skin 3 (three) times daily before meals., Disp: , Rfl:    Insulin  Pen Needle 32G X 4 MM MISC, Use 1 each to inject insulin  into the skin 4 (four) times daily., Disp: 400 each, Rfl: 3   nitroGLYCERIN  (  NITROSTAT ) 0.4 MG SL tablet, Place 1 tablet (0.4 mg total) under the tongue every 5 (five) minutes as needed for chest pain., Disp: 45 tablet,  Rfl: 3   Risankizumab -rzaa (SKYRIZI ) 360 MG/2.4ML SOCT, Inject 2.4 mLs into the skin every 6 (six) weeks., Disp: 2.4 mL, Rfl: 4   rosuvastatin  (CRESTOR ) 40 MG tablet, Take 1 tablet (40 mg total) by mouth daily., Disp: 90 tablet, Rfl: 3   ticagrelor  (BRILINTA ) 60 MG TABS tablet, Take 1 tablet (60 mg total) by mouth 2 (two) times daily., Disp: 180 tablet, Rfl: 3  Current Facility-Administered Medications:    Insulin  Pen Needle (NOVOFINE) 10 each, 1 packet, Subcutaneous, QHS, Gretta Ozell CROME, PA-C   Vital signs in last 24 hrs: Vitals:   05/25/24 1019  BP: 99/60  Pulse: 87   Wt Readings from Last 3 Encounters:  05/25/24 217 lb 12.8 oz (98.8 kg)  05/19/24 213 lb (96.6 kg)  05/04/24 215 lb 9.6 oz (97.8 kg)    Physical Exam  He is not acutely ill-appearing No additional exam (see above)  Labs:   ___________________________________________ Radiologic studies: Narrative & Impression  CLINICAL DATA:  Thoracic aortic aneurysm.   EXAM: CT ANGIOGRAPHY CHEST WITH CONTRAST   TECHNIQUE: Multidetector CT imaging of the chest was performed using the standard protocol during bolus administration of intravenous contrast. Multiplanar CT image reconstructions and MIPs were obtained to evaluate the vascular anatomy.   RADIATION DOSE REDUCTION: This exam was performed according to the departmental dose-optimization program which includes automated exposure control, adjustment of the mA and/or kV according to patient size and/or use of iterative reconstruction technique.   CONTRAST:  75mL OMNIPAQUE  IOHEXOL  350 MG/ML SOLN   COMPARISON:  None Available.   FINDINGS: Cardiovascular: No evidence of thoracic aortic aneurysm, with ascending aorta measuring 3.6 cm in maximum diameter. No evidence of thoracic aortic dissection. No pulmonary emboli identified.   Mediastinum/Nodes: No masses or pathologically enlarged lymph nodes identified.   Lungs/Pleura: No pulmonary mass, infiltrate, or  effusion.   Upper abdomen: Tiny gallstones are seen, but without signs of cholecystitis or biliary dilatation.   Musculoskeletal: No suspicious bone lesions identified.   Review of the MIP images confirms the above findings.   IMPRESSION: No evidence of thoracic aortic aneurysm or dissection.   Cholelithiasis. No radiographic evidence of cholecystitis.     Electronically Signed   By: Norleen DELENA Kil M.D.    ____________________________________________ Other:   _____________________________________________   Encounter Diagnoses  Name Primary?   Crohn's disease of both small and large intestine with fistula (HCC) Yes   Perianal pain    Long-term use of immunosuppressant medication    Crohn's disease of perianal region with abscess (HCC)     Assessment and Plan Assessment & Plan Fistulizing Crohn's disease with inadequate response to Skyrizi  Chronic severe refractory disease with significant symptoms. Inadequate response to Skyrizi . Discussed JAK inhibitors like Rinvoq, noting increased clot risk and need for IBD expert consultation due to complexity. Consideration of JAK inhibitors requires risk assessment due to heart attack history. As noted above, this was a challenging interaction today, and it is clear that Seann has lost confidence in my care and does not want me to manage his Crohn's disease at this point.  Unfortunately, I was unable to make progress throughout a lengthy and recent discussion in hopes that he might see that what he perceived as poor follow-up on my part was neither intentional nor malicious, and that he also must be a partner in  communication and care coordination. - Offered referral to IBD expert at [facility] or specialized centers.  I told him and his wife that he can return to see me if he should change his mind, though at this juncture it seems doubtful he will do so. Even after recommending evaluation by another IBD center (and not necessarily just [facility],  but he could consider [facility], [place] [facility] or other academic centers), he left the office saying he does not know what he will do at this point but that he plans to stop the Skyrizi  with no further treatment plans.  What I did not have the opportunity to suggest before he left the office that we check in for C. difficile given the reported diarrhea.  I will send him a portal message and see if he is interested.  35 minutes were spent on this encounter (including chart review, history/exam, counseling/coordination of care, and documentation), 25 minutes of that time was spent on counseling and coordination of care.  (As documented on the abridge timer)  Victory LITTIE Brand III

## 2024-05-27 ENCOUNTER — Other Ambulatory Visit: Payer: Self-pay

## 2024-06-01 ENCOUNTER — Other Ambulatory Visit: Payer: Self-pay

## 2024-06-01 NOTE — Progress Notes (Signed)
 Due to ongoing side effects with Skyrizi , patient has decided to stop treatment. Patient has called GI office to make them aware. Disenrolled

## 2024-06-08 ENCOUNTER — Telehealth: Payer: Self-pay | Admitting: Student in an Organized Health Care Education/Training Program

## 2024-06-08 DIAGNOSIS — K50919 Crohn's disease, unspecified, with unspecified complications: Secondary | ICD-10-CM

## 2024-06-08 NOTE — Telephone Encounter (Signed)
 Copied from CRM [id number]. Topic: Referral - Request for Referral >> Jun 08, 2024 12:17 PM Drema MATSU wrote:   Did the patient discuss referral with their provider in the last year? Yes (If No - schedule appointment) (If Yes - send message)  Appointment offered? No  Type of order/referral and detailed reason for visit: GI for severe Kron's disease  Preference of office, provider, location: Dr. Mikle Rend at [address] [place], [place] * wife stated this doctor only has 4 appointments left for this year and they want to get on the books before next year.   If referral order, have you been seen by this specialty before? Yes (If Yes, this issue or another issue? When? Where? [facility] Dr. Legrand   Can we respond through MyChart? Yes

## 2024-06-08 NOTE — Telephone Encounter (Signed)
 Okay to place referral

## 2024-06-09 NOTE — Telephone Encounter (Signed)
 Called patient to inform him of this information, left Vm to return call if any questions

## 2024-06-09 NOTE — Telephone Encounter (Signed)
 Referral placed to [facility] for management of complicated Crohn's disease.  Thank you.

## 2024-06-09 NOTE — Addendum Note (Signed)
 Addended by: JERRELL SOLIAN T on: 06/09/2024 07:33 AM   Modules accepted: Orders

## 2024-06-14 ENCOUNTER — Other Ambulatory Visit (HOSPITAL_COMMUNITY): Payer: Self-pay

## 2024-06-28 ENCOUNTER — Other Ambulatory Visit (HOSPITAL_COMMUNITY): Payer: Self-pay

## 2024-06-28 ENCOUNTER — Other Ambulatory Visit: Payer: Self-pay | Admitting: Student in an Organized Health Care Education/Training Program

## 2024-06-28 ENCOUNTER — Other Ambulatory Visit: Payer: Self-pay

## 2024-06-28 DIAGNOSIS — E1165 Type 2 diabetes mellitus with hyperglycemia: Secondary | ICD-10-CM

## 2024-07-02 ENCOUNTER — Ambulatory Visit: Admitting: Student in an Organized Health Care Education/Training Program

## 2024-07-02 ENCOUNTER — Encounter: Payer: Self-pay | Admitting: Student in an Organized Health Care Education/Training Program

## 2024-07-02 ENCOUNTER — Telehealth: Payer: Self-pay

## 2024-07-02 VITALS — BP 112/82 | HR 107 | Wt 208.0 lb

## 2024-07-02 DIAGNOSIS — E1165 Type 2 diabetes mellitus with hyperglycemia: Secondary | ICD-10-CM | POA: Diagnosis not present

## 2024-07-02 DIAGNOSIS — R5383 Other fatigue: Secondary | ICD-10-CM | POA: Insufficient documentation

## 2024-07-02 DIAGNOSIS — I10 Essential (primary) hypertension: Secondary | ICD-10-CM

## 2024-07-02 DIAGNOSIS — K50919 Crohn's disease, unspecified, with unspecified complications: Secondary | ICD-10-CM | POA: Diagnosis not present

## 2024-07-02 DIAGNOSIS — Z794 Long term (current) use of insulin: Secondary | ICD-10-CM

## 2024-07-02 LAB — POCT GLYCOSYLATED HEMOGLOBIN (HGB A1C): Hemoglobin A1C: 7.6 % — AB (ref 4.0–5.6)

## 2024-07-02 NOTE — Telephone Encounter (Signed)
 Relayed these concerns to Dr. Jerrell in visit

## 2024-07-02 NOTE — Progress Notes (Signed)
 Established Patient Office Visit  Patient ID: Sean Schaefer, male    DOB: 07/11/1966  Age: 58 y.o. MRN: [id number] PCP: Jerrell Cleatus Ned, MD  Chief Complaint  Patient presents with   Diabetes    3 month follow up   past 2-3 week pt energy level is very low,  been eating very little,  No longer taking Skyrizi  wife thinks that could be why.   Pt wife would like some labs done as well today if possible.     Subjective:     HPI  Discussed the use of AI scribe software for clinical note transcription with the patient, who gave verbal consent to proceed.  History of Present Illness Sean Schaefer is a 58 year old male with Crohn's disease and diabetes who presents with fatigue and weakness.  He experiences significant fatigue and weakness, which he attributes to discontinuing Skyrizi . He stopped taking Skyrizi  after his last visit, noting that it takes about five weeks for the medication to leave his system.  His gastrointestinal symptoms, including diarrhea and pain, have improved since stopping Skyrizi . He expresses frustration with previous medical care, citing poor follow-up and communication from his previous provider.  His diabetes management has shown improvement, with his A1c decreasing from 12.6 in July to 7.6 currently. He is using insulin  products, including short-acting insulin  before meals, which was recently increased from 10 to 14 units. He has not used Tresiba  since Monday, but his blood sugars remain stable, ranging from 135 to 140 on his CGM.  He reports a weight loss of seven pounds over the past two months, which he attributes to both diet changes and the effects of Crohn's disease. Crohn's disease has previously impacted his A1c levels, causing them to rise.  His urinary symptoms have resolved since October, and he reports that he is now back to normal. He recalls being miserable during the time when he experienced these symptoms.     Objective:     BP  112/82   Pulse (!) 107   Wt 208 lb (94.3 kg)   BMI 29.84 kg/m   Physical Exam  Gen: Tired appearing man. Neck: Normal thyroid, no nodules or adenopathy Heart: Regular, no murmur Lungs: Unlabored, clear throughout Abd: Soft, nontender Ext: No lower extremity edema   Results for orders placed or performed in visit on 07/02/24  POCT glycosylated hemoglobin (Hb A1C)  Result Value Ref Range   Hemoglobin A1C 7.6 (A) 4.0 - 5.6 %   HbA1c POC (<> result, manual entry)     HbA1c, POC (prediabetic range)     HbA1c, POC (controlled diabetic range)        Assessment & Plan:   Problem List Items Addressed This Visit       High   Crohn's disease (HCC) (Chronic)   Crohn's disease recently exacerbated but symptoms improved after discontinuing Skyrizi . He lost 7 pounds over two months. Referral to a new gastroenterologist is in process. Ordered blood work to evaluate for anemia or iron deficiency. Follow up with gastroenterologist as referral is in process.       Uncontrolled type 2 diabetes mellitus with hyperglycemia, with long-term current use of insulin  (HCC) - Primary (Chronic)   Type 2 diabetes mellitus shows improved glycemic control. Hemoglobin A1c decreased from 12.6% to 7.6%. Blood glucose levels are stable with continuous glucose monitoring. Continue insulin  lispro 14 units before meals.  I am a little worried that he discontinued long-acting insulin  about a week ago.  He reports that his Dexcom readings have still been okay.  I encouraged him to restart at least some long-acting insulin  if he is noting morning hyperglycemia above 180.  He has follow-up with endocrinology planned for next week as well.      Relevant Orders   POCT glycosylated hemoglobin (Hb A1C) (Completed)   Comprehensive metabolic panel with GFR     Medium    Primary hypertension (Chronic)   Blood pressure is well-controlled today.  Likely helped by his recent weight loss.  Previously using losartan , this was  discontinued about a year ago due to an acute kidney injury.  I would be interested in trying again with an ARB, especially if he has issues in the future with microalbuminuria.  For now he is managing hypertension mostly with lifestyle, also uses carvedilol  because of ischemic heart disease.        Unprioritized   Fatigue   New issue over the last few weeks.  Associates this with his Crohn's disease no longer being on treatment with Skyrizi .  He found the side effects to be intolerable.  I am not sure the etiology.  I recommended lab work to rule out anemia, iron deficiency, B12 deficiency, he is at risk for malabsorption due to his GI disease.  Would also look at electrolytes and renal function.  Exam is otherwise reassuring today.      Relevant Orders   CBC   Comprehensive metabolic panel with GFR   IBC + Ferritin   Magnesium   TSH   Vitamin B12     Return in about 3 months (around 09/30/2024) for Physical exam visit.    Cleatus Debby Specking, MD [facility]

## 2024-07-02 NOTE — Assessment & Plan Note (Signed)
 Type 2 diabetes mellitus shows improved glycemic control. Hemoglobin A1c decreased from 12.6% to 7.6%. Blood glucose levels are stable with continuous glucose monitoring. Continue insulin  lispro 14 units before meals.  I am a little worried that he discontinued long-acting insulin  about a week ago.  He reports that his Dexcom readings have still been okay.  I encouraged him to restart at least some long-acting insulin  if he is noting morning hyperglycemia above 180.  He has follow-up with endocrinology planned for next week as well.

## 2024-07-02 NOTE — Patient Instructions (Signed)
°  VISIT SUMMARY: During today's visit, we discussed your ongoing issues with fatigue and weakness, which you believe are related to discontinuing Skyrizi . We also reviewed your diabetes management, which has shown significant improvement, and your Crohn's disease symptoms, which have improved since stopping Skyrizi . Additionally, we addressed your recent weight loss and resolved urinary symptoms.  YOUR PLAN: -TYPE 2 DIABETES MELLITUS WITH HYPERGLYCEMIA: Type 2 diabetes is a condition where your body does not use insulin  properly, leading to high blood sugar levels. Your blood sugar control has improved significantly, with your A1c dropping from 12.6% to 7.6%. Continue taking insulin  lispro at 14 units before meals and monitor your blood glucose levels using your continuous glucose monitor. You have an appointment with an endocrinologist next week to discuss your long-term insulin  needs.  -CROHN'S DISEASE WITH COMPLICATIONS: Crohn's disease is a chronic inflammatory condition of the gastrointestinal tract. Your symptoms have improved since you stopped taking Skyrizi , but you have lost 7 pounds over the past two months. We have ordered blood work to check for anemia or iron deficiency. A referral to a new gastroenterologist is in process, and you should follow up with them once the referral is complete.  -GENERAL HEALTH MAINTENANCE: Your blood pressure is well-controlled, and your weight has decreased from 215 to 208 pounds, likely due to dietary changes and Crohn's disease. Continue with your current dietary changes.  INSTRUCTIONS: Follow up with your endocrinologist next week to evaluate your long-term insulin  needs. Once your referral to a new gastroenterologist is complete, schedule a follow-up appointment with them. Continue monitoring your blood glucose levels using your continuous glucose monitor and maintain your current dietary changes.

## 2024-07-02 NOTE — Assessment & Plan Note (Signed)
 New issue over the last few weeks.  Associates this with his Crohn's disease no longer being on treatment with Skyrizi .  He found the side effects to be intolerable.  I am not sure the etiology.  I recommended lab work to rule out anemia, iron deficiency, B12 deficiency, he is at risk for malabsorption due to his GI disease.  Would also look at electrolytes and renal function.  Exam is otherwise reassuring today.

## 2024-07-02 NOTE — Telephone Encounter (Signed)
 Copied from CRM #[id number]. Topic: General - Other >> Jul 02, 2024  8:41 AM Carlyon D wrote: Reason for CRM: pt wife is calling saying pt has an appt today at 9am and he will be alone as wife usually goes with. pt wife wants to update pcp that for the past 2-3 week pt energy level is very low,  been eating very little,  No longer taking Skyrizi  wife thinks that could be why.   Pt wife would like some labs done as well today if possible.

## 2024-07-02 NOTE — Assessment & Plan Note (Signed)
 Blood pressure is well-controlled today.  Likely helped by his recent weight loss.  Previously using losartan , this was discontinued about a year ago due to an acute kidney injury.  I would be interested in trying again with an ARB, especially if he has issues in the future with microalbuminuria.  For now he is managing hypertension mostly with lifestyle, also uses carvedilol  because of ischemic heart disease.

## 2024-07-02 NOTE — Assessment & Plan Note (Signed)
 Crohn's disease recently exacerbated but symptoms improved after discontinuing Skyrizi . He lost 7 pounds over two months. Referral to a new gastroenterologist is in process. Ordered blood work to evaluate for anemia or iron deficiency. Follow up with gastroenterologist as referral is in process.

## 2024-07-06 ENCOUNTER — Other Ambulatory Visit

## 2024-07-06 DIAGNOSIS — E1165 Type 2 diabetes mellitus with hyperglycemia: Secondary | ICD-10-CM

## 2024-07-06 DIAGNOSIS — Z794 Long term (current) use of insulin: Secondary | ICD-10-CM | POA: Diagnosis not present

## 2024-07-06 DIAGNOSIS — R5383 Other fatigue: Secondary | ICD-10-CM

## 2024-07-07 LAB — COMPREHENSIVE METABOLIC PANEL WITH GFR
ALT: 16 U/L (ref 3–53)
AST: 21 U/L (ref 5–37)
Albumin: 3.1 g/dL — ABNORMAL LOW (ref 3.5–5.2)
Alkaline Phosphatase: 56 U/L (ref 39–117)
BUN: 20 mg/dL (ref 6–23)
CO2: 23 meq/L (ref 19–32)
Calcium: 8.6 mg/dL (ref 8.4–10.5)
Chloride: 97 meq/L (ref 96–112)
Creatinine, Ser: 1.65 mg/dL — ABNORMAL HIGH (ref 0.40–1.50)
GFR: 46.15 mL/min — ABNORMAL LOW (ref >60.00)
Glucose, Bld: 329 mg/dL — ABNORMAL HIGH (ref 70–99)
Potassium: 4 meq/L (ref 3.5–5.1)
Sodium: 129 meq/L — ABNORMAL LOW (ref 135–145)
Total Bilirubin: 0.4 mg/dL (ref 0.2–1.2)
Total Protein: 8.3 g/dL (ref 6.0–8.3)

## 2024-07-07 LAB — CBC
HCT: 30.5 % — ABNORMAL LOW (ref 39.0–52.0)
Hemoglobin: 10.1 g/dL — ABNORMAL LOW (ref 13.0–17.0)
MCHC: 33 g/dL (ref 30.0–36.0)
MCV: 84.1 fl (ref 78.0–100.0)
Platelets: 314 K/uL (ref 150.0–400.0)
RBC: 3.62 Mil/uL — ABNORMAL LOW (ref 4.22–5.81)
RDW: 15.5 % (ref 11.5–15.5)
WBC: 8.5 K/uL (ref 4.0–10.5)

## 2024-07-07 LAB — TSH: TSH: 0.96 u[IU]/mL (ref 0.35–5.50)

## 2024-07-07 LAB — IBC + FERRITIN
Ferritin: 208.7 ng/mL (ref 22.0–322.0)
Iron: 55 ug/dL (ref 42–165)
Saturation Ratios: 22.3 % (ref 20.0–50.0)
TIBC: 246.4 ug/dL — ABNORMAL LOW (ref 250.0–450.0)
Transferrin: 176 mg/dL — ABNORMAL LOW (ref 212.0–360.0)

## 2024-07-07 LAB — VITAMIN B12: Vitamin B-12: 892 pg/mL (ref 211–911)

## 2024-07-07 LAB — MAGNESIUM: Magnesium: 1.6 mg/dL (ref 1.5–2.5)

## 2024-07-08 ENCOUNTER — Ambulatory Visit: Payer: Self-pay | Admitting: Student in an Organized Health Care Education/Training Program

## 2024-07-09 ENCOUNTER — Ambulatory Visit: Admitting: Internal Medicine

## 2024-07-09 ENCOUNTER — Other Ambulatory Visit

## 2024-07-09 ENCOUNTER — Other Ambulatory Visit: Payer: Self-pay

## 2024-07-09 ENCOUNTER — Other Ambulatory Visit (HOSPITAL_COMMUNITY): Payer: Self-pay

## 2024-07-09 ENCOUNTER — Encounter: Payer: Self-pay | Admitting: Internal Medicine

## 2024-07-09 VITALS — BP 100/64 | HR 57 | Ht 70.0 in | Wt 212.4 lb

## 2024-07-09 DIAGNOSIS — E7849 Other hyperlipidemia: Secondary | ICD-10-CM

## 2024-07-09 DIAGNOSIS — E1159 Type 2 diabetes mellitus with other circulatory complications: Secondary | ICD-10-CM

## 2024-07-09 DIAGNOSIS — Z794 Long term (current) use of insulin: Secondary | ICD-10-CM

## 2024-07-09 DIAGNOSIS — E1165 Type 2 diabetes mellitus with hyperglycemia: Secondary | ICD-10-CM

## 2024-07-09 MED ORDER — LYUMJEV KWIKPEN 100 UNIT/ML ~~LOC~~ SOPN
10.0000 [IU] | PEN_INJECTOR | Freq: Three times a day (TID) | SUBCUTANEOUS | 3 refills | Status: AC
  Filled 2024-07-09 – 2024-07-27 (×2): qty 30, 72d supply, fill #0

## 2024-07-09 MED ORDER — TRESIBA FLEXTOUCH 200 UNIT/ML ~~LOC~~ SOPN
34.0000 [IU] | PEN_INJECTOR | Freq: Every day | SUBCUTANEOUS | 3 refills | Status: AC
  Filled 2024-07-09 – 2024-07-27 (×2): qty 15, 88d supply, fill #0

## 2024-07-09 NOTE — Patient Instructions (Addendum)
 Please restart: - Tresiba  20 units daily - increase by 2-4 unis every 4 days until sugars in am are <130  Continue: - Lyumjev  10-14 units right before meals If you have to take this after a meal, decrease the dose to 50%.  Try to use correction Lyumjev  if needed: - 140-160: + 2 units  - 161-180: + 3 units  - 181-200: + 4 units  - 201-240: + 5 units  - 241-280: + 6 units  - 281-320: + 7 units  - 321-360: + 8 units  - >361: + 10 units   Please return in 3 months.

## 2024-07-09 NOTE — Progress Notes (Signed)
 Patient ID: Rupert LITTIE Gaskins, male   DOB: 12/27/1965, 58 y.o.   MRN: [id number]  HPI: KYSHON TOLLIVER is a 58 y.o.-year-old male, initially referred by Dr. Lonni Pizza (colorectal surgery), for management of DM2, dx in 2015-16, insulin -dependent few years later, uncontrolled, with complications (CAD, h/o AMI; mild CKD; DR).  Last visit 2 months ago. He saw Dr. Von.  Interim hx: He continues to have hives, diarrhea, urinary hesitancy, and hyperglycemia from Skyrizi .  He was on cortisone cream, but no p.o. or parenteral steroids.  He came off Skyrizi  soon after our last visit. GI sxs resolved.  He has still feels fatigued, but without increased urination, blurry vision, chest pain. He tells me that he came off Tresiba  approximately 2 weeks ago, as he was not able to refill it at the pharmacy.  Reviewed HbA1c: Lab Results  Component Value Date   HGBA1C 7.6 (A) 07/02/2024   HGBA1C 12.6 (A) 01/30/2024   HGBA1C 10.4 (A) 10/28/2023   HGBA1C 12.0 (A) 08/19/2023   HGBA1C 11.5 (H) 06/26/2023   HGBA1C 8.9 (H) 03/12/2023   HGBA1C 12.1 (H) 10/21/2022   HGBA1C 12.1 (H) 10/20/2022   HGBA1C 8.6 (H) 01/30/2021   HGBA1C 10.7 (H) 12/01/2020   He is on: - Tresiba  38 >> off >> 20 >> 28 units daily -injected in the abdomen >> thighs >> 24 units - Humalog  10 units 3x a day, with meals >> Lyumjev  6-8 >> 6-10 units right before the meal - burning at injection site when using the arms. He had nausea and shaking from Lyumjev , but he feels that this was related to taking too much Lyumjev ... >> 10 to 14 units before meals + sliding scale:  140-160: + 2 units  - 161-180: + 3 units  - 181-200: + 4 units  - 201-240: + 5 units  - 241-280: + 6 units  - 281-320: + 7 units  - 321-360: + 8 units  - >361: + 10 units  He could not tolerate metformin  2/2 diarrhea.  He checks his blood sugars more than 4 times a day with his CGM:  Previously:  Prev.:  Lowest sugar was ? (Dizziness, resolved with glucose tablets)  >> 50s. Highest sugar was: 300s >> 300s.  Glucometer: none  Pt's meals are: - Breakfast:skips or juice - Lunch: meat occasionally + veggies - Dinner: (before 6 pm): chicken + broccoli + brown rice (1/3 cup) - Snacks:- Drinks Water , juice.  - + mild CKD, last BUN/creatinine:  Lab Results  Component Value Date   BUN 20 07/06/2024   BUN 16 08/18/2023   CREATININE 1.65 (H) 07/06/2024   CREATININE 1.21 08/18/2023   Lab Results  Component Value Date   MICRALBCREAT 138 (H) 10/29/2023   MICRALBCREAT 156 (H) 08/19/2023   MICRALBCREAT 15.9 12/17/2017   MICRALBCREAT <5.1 10/22/2016  He is not on an ACE inhibitor or ARB.  -+ HL; last set of lipids: Lab Results  Component Value Date   CHOL 100 01/20/2024   HDL 36 (L) 01/20/2024   LDLCALC 34 01/20/2024   LDLDIRECT 76.0 06/01/2019   TRIG 181 (H) 01/20/2024   CHOLHDL 2.8 01/20/2024  He is on Crestor  40 mg daily.  - last eye exam was in 10/2023: + DR: [facility].  - no numbness and tingling in his feet.  Last foot exam 10/28/2023. + L hand tingling.  Pt has FH of DM in PGF.  He has a history of HTN, aortic dilation, Crohn's disease/Ulcerative colitis - prev.  On Humira , now on Skyrizi , with perianal abscesses. He had the whole large colon removed at 58 y/o [facility]). He was in the hospital for 9 mo. 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 He estimates he had a total of >25 surgeries, including one for testicle removal.  1.5 years ago: 260 lbs >> changed diet >> lost weight to 190s.  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   Myocardial infarction (HCC) 09/2022   Non-STEMI   Rectal abscess 11/30/2020   Status post coronary artery stent placement 10/21/2022   Ulcerative colitis (HCC)     Social History    Socioeconomic History   Marital status: Married    Spouse name: Ricka   Number of children: 2   Years of education: Not on file   Highest education level: Not on file  Occupational History   Not on file  Tobacco Use   Smoking status: Never   Smokeless tobacco: Never  Vaping Use   Vaping status: Never Used  Substance and Sexual Activity   Alcohol use: No   Drug use: No   Sexual activity: Yes  Other Topics Concern   Not on file  Social History Narrative   Married   Social Drivers of Health   Tobacco Use: Low Risk (07/02/2024)   Patient History    Smoking Tobacco Use: Never    Smokeless Tobacco Use: Never    Passive Exposure: Not on file  Financial Resource Strain: Not on file  Food Insecurity: No Food Insecurity (03/13/2023)   Hunger Vital Sign    Worried About Running Out of Food in the Last Year: Never true    Ran Out of Food in the Last Year: Never true  Transportation Needs: No Transportation Needs (03/13/2023)   PRAPARE - Administrator, Civil Service (Medical): No    Lack of Transportation (Non-Medical): No  Physical Activity: Not on file  Stress: Not on file  Social Connections: Unknown (10/19/2022)   [facility] from [facility]   Social Network    Social Network: Not on file  Intimate Partner Violence: Not At Risk (03/13/2023)   Humiliation, Afraid, Rape, and Kick questionnaire    Fear of Current or Ex-Partner: No    Emotionally Abused: No    Physically Abused: No    Sexually Abused: No  Depression (PHQ2-9): Low Risk (07/02/2024)   Depression (PHQ2-9)    PHQ-2 Score: 2  Alcohol Screen: Not on file  Housing: Unknown (07/29/2023)   [facility]   Epic    Unable to Pay for Housing in the Last Year: Not on file    Number of Times Moved in the Last Year: Not on file    At any time in the past 12 months, were you homeless or living in a shelter (including now)?: No  Utilities: Not At Risk (03/13/2023)   [facility] Utilities     Threatened with loss of utilities: No  Health Literacy: Not on file   Current Outpatient Medications on File Prior to Visit  Medication Sig Dispense Refill   Accu-Chek Softclix Lancets lancets Use to check glucose once a day 100 each 12   Blood Glucose Monitoring  Suppl (ACCU-CHEK GUIDE) w/Device KIT Use to check glucose once a day 1 kit 0   carvedilol  (COREG ) 12.5 MG tablet Take 1 tablet (12.5 mg total) by mouth 2 (two) times daily with a meal. 180 tablet 3   Continuous Glucose Sensor (FREESTYLE LIBRE 3 PLUS SENSOR) MISC Apply to skin every 15 days as directed. 6 each 3   glucose blood (ACCU-CHEK GUIDE TEST) test strip Use to check glucose once a day 100 each 12   Insulin  Lispro-aabc (LYUMJEV  KWIKPEN) 100 UNIT/ML KwikPen Inject 10-14 Units into the skin 3 (three) times daily before meals.     Insulin  Pen Needle 32G X 4 MM MISC Use 1 each to inject insulin  into the skin 4 (four) times daily. 400 each 3   nitroGLYCERIN  (NITROSTAT ) 0.4 MG SL tablet Place 1 tablet (0.4 mg total) under the tongue every 5 (five) minutes as needed for chest pain. 45 tablet 3   rosuvastatin  (CRESTOR ) 40 MG tablet Take 1 tablet (40 mg total) by mouth daily. 90 tablet 3   ticagrelor  (BRILINTA ) 60 MG TABS tablet Take 1 tablet (60 mg total) by mouth 2 (two) times daily. 180 tablet 3   Current Facility-Administered Medications on File Prior to Visit  Medication Dose Route Frequency Provider Last Rate Last Admin   Insulin  Pen Needle (NOVOFINE) 10 each  1 packet Subcutaneous QHS Gretta Ozell CROME, PA-C       Allergies  Allergen Reactions   Statins Other (See Comments)    Myalgias, tolerates rosuvastatin      Family History  Problem Relation Age of Onset   Hypertension Mother    Cancer Mother    Hypertension Father    Inflammatory bowel disease Brother    Diabetes Paternal Uncle    Diabetes Paternal Grandmother    Diabetes Paternal Grandfather    Heart disease Neg Hx    Colon cancer Neg Hx    Esophageal cancer  Neg Hx    Rectal cancer Neg Hx    Stomach cancer Neg Hx    PE: BP 100/64   Pulse (!) 57   Ht 5' 10 (1.778 m)   Wt 212 lb 6.4 oz (96.3 kg)   SpO2 97%   BMI 30.48 kg/m  Wt Readings from Last 15 Encounters:  07/09/24 212 lb 6.4 oz (96.3 kg)  07/02/24 208 lb (94.3 kg)  05/25/24 217 lb 12.8 oz (98.8 kg)  05/19/24 213 lb (96.6 kg)  05/04/24 215 lb 9.6 oz (97.8 kg)  04/30/24 217 lb (98.4 kg)  04/01/24 219 lb (99.3 kg)  03/18/24 216 lb (98 kg)  01/30/24 212 lb 9.6 oz (96.4 kg)  01/20/24 207 lb (93.9 kg)  10/28/23 211 lb (95.7 kg)  10/23/23 209 lb (94.8 kg)  08/19/23 198 lb 9.6 oz (90.1 kg)  08/05/23 191 lb 2 oz (86.7 kg)  06/26/23 191 lb (86.6 kg)   Constitutional: normal weight, in NAD Eyes:  EOMI, no exophthalmos ENT: no neck masses, no cervical lymphadenopathy Cardiovascular: RRR, No MRG Respiratory: CTA B Musculoskeletal: no deformities Skin: no rash Neurological: no tremor with outstretched hands  ASSESSMENT: 1. DM2, insulin -dependent, uncontrolled, with complications: - CAD, with h/o AMI - s/p PCI 09/2022 - mild CKD - DR  2. HL  PLAN:  1. Patient with longstanding, uncontrolled type 2 diabetes, with an HbA1c of 12.6% in 01/2024, with significant hyperglycemia after starting Skyrizi .  He was overall feeling very poorly on this medication.  He also described diarrhea from Tresiba .  I advised him  to move the injection to the thighs.  This helped.  At last visit, sugars appears to be fluctuating above the upper limit of the target range, improved in the previous 10 days but still above target so I advised him to increase both Tresiba  and Lyumjev  doses.  I advised him to continue to vary the dose of Lyumjev  based on the size and consistency of his meals.  HbA1c was rechecked 7 days ago and this was spectacularly improved, to 7.6%. CGM interpretation: -At today's visit, we reviewed his CGM downloads: It appears that 25% of values are in target range (goal >70%), while 76%  are higher than 180 (goal <25%), and 0% are lower than 70 (goal <4%).  The calculated average blood sugar is 214.  The projected HbA1c for the next 3 months (GMI) is 8.4%. -Reviewing the CGM trends, sugars appear to be elevated, staying above the target range, close to the 250 mg/dL value overnight, and then fluctuating during the day, with most values above target.  We discussed that the most likely reason for his blood sugars to increase recently is the lack of long-acting insulin .  I sent a new prescription to his pharmacy and will start at a lower dose and increase as needed.  For now, we can continue the same dose of Lyumjev .  He sometimes forgets it when eating out and we discussed about the importance of taking the pen with him and injecting it before each meal since sugars after meals without insulin  are quite elevated. - I suggested to:  Patient Instructions  Please restart: - Tresiba  20 units daily - increase by 2-4 unis every 4 days until sugars in am are <130  Continue: - Lyumjev  10-14 units right before meals If you have to take this after a meal, decrease the dose to 50%.  Try to use correction Lyumjev  if needed: - 140-160: + 2 units  - 161-180: + 3 units  - 181-200: + 4 units  - 201-240: + 5 units  - 241-280: + 6 units  - 281-320: + 7 units  - 321-360: + 8 units  - >361: + 10 units   Please return in 3 months.  - advised to check sugars at different times of the day - 4x a day, rotating check times - advised for yearly eye exams >> he is UTD - At last visit we were not able to obtain an ACR due to diarrhea and problems urinating.  Will try to get one today. - return to clinic in 3 months  2. HL - Latest lipid panel was reviewed from 01/2024: LDL at goal, improved, triglycerides elevated, HDL slightly low: Lab Results  Component Value Date   CHOL 100 01/20/2024   HDL 36 (L) 01/20/2024   LDLCALC 34 01/20/2024   LDLDIRECT 76.0 06/01/2019   TRIG 181 (H) 01/20/2024    CHOLHDL 2.8 01/20/2024  - Will continue Crestor  40 mg daily.  No side effects.  Lela Fendt, MD PhD [facility] Endocrinology

## 2024-07-19 ENCOUNTER — Other Ambulatory Visit (HOSPITAL_COMMUNITY): Payer: Self-pay

## 2024-07-27 ENCOUNTER — Other Ambulatory Visit (HOSPITAL_COMMUNITY): Payer: Self-pay

## 2024-08-25 ENCOUNTER — Other Ambulatory Visit (HOSPITAL_COMMUNITY): Payer: Self-pay

## 2024-09-30 ENCOUNTER — Encounter: Admitting: Student in an Organized Health Care Education/Training Program

## 2024-10-18 ENCOUNTER — Ambulatory Visit: Admitting: Internal Medicine
